# Patient Record
Sex: Male | Born: 1935
Health system: Southern US, Community
[De-identification: ages and names within clinical notes are randomized; demographics above are authoritative.]

## PROBLEM LIST (undated history)

## (undated) DIAGNOSIS — Z9889 Other specified postprocedural states: Secondary | ICD-10-CM

## (undated) DIAGNOSIS — R011 Cardiac murmur, unspecified: Secondary | ICD-10-CM

## (undated) DIAGNOSIS — G5600 Carpal tunnel syndrome, unspecified upper limb: Secondary | ICD-10-CM

## (undated) DIAGNOSIS — E1149 Type 2 diabetes mellitus with other diabetic neurological complication: Secondary | ICD-10-CM

## (undated) DIAGNOSIS — B009 Herpesviral infection, unspecified: Secondary | ICD-10-CM

## (undated) DIAGNOSIS — M48061 Spinal stenosis, lumbar region without neurogenic claudication: Secondary | ICD-10-CM

## (undated) DIAGNOSIS — J069 Acute upper respiratory infection, unspecified: Secondary | ICD-10-CM

## (undated) DIAGNOSIS — M549 Dorsalgia, unspecified: Secondary | ICD-10-CM

## (undated) DIAGNOSIS — R252 Cramp and spasm: Secondary | ICD-10-CM

## (undated) DIAGNOSIS — F329 Major depressive disorder, single episode, unspecified: Secondary | ICD-10-CM

## (undated) DIAGNOSIS — B353 Tinea pedis: Secondary | ICD-10-CM

## (undated) DIAGNOSIS — Z87898 Personal history of other specified conditions: Secondary | ICD-10-CM

## (undated) DIAGNOSIS — I2510 Atherosclerotic heart disease of native coronary artery without angina pectoris: Secondary | ICD-10-CM

## (undated) DIAGNOSIS — I200 Unstable angina: Secondary | ICD-10-CM

## (undated) DIAGNOSIS — E119 Type 2 diabetes mellitus without complications: Secondary | ICD-10-CM

## (undated) DIAGNOSIS — R197 Diarrhea, unspecified: Secondary | ICD-10-CM

## (undated) DIAGNOSIS — G4733 Obstructive sleep apnea (adult) (pediatric): Secondary | ICD-10-CM

## (undated) DIAGNOSIS — M25519 Pain in unspecified shoulder: Secondary | ICD-10-CM

## (undated) DIAGNOSIS — E785 Hyperlipidemia, unspecified: Secondary | ICD-10-CM

## (undated) DIAGNOSIS — M1990 Unspecified osteoarthritis, unspecified site: Secondary | ICD-10-CM

## (undated) DIAGNOSIS — M722 Plantar fascial fibromatosis: Secondary | ICD-10-CM

## (undated) DIAGNOSIS — I10 Essential (primary) hypertension: Secondary | ICD-10-CM

## (undated) DIAGNOSIS — G4762 Sleep related leg cramps: Secondary | ICD-10-CM

## (undated) DIAGNOSIS — Z9861 Coronary angioplasty status: Secondary | ICD-10-CM

## (undated) HISTORY — DX: Essential (primary) hypertension: I10

## (undated) HISTORY — DX: Carpal tunnel syndrome, unspecified upper limb: G56.00

## (undated) HISTORY — DX: Diarrhea, unspecified: R19.7

## (undated) HISTORY — DX: Major depressive disorder, single episode, unspecified: F32.9

## (undated) HISTORY — PX: NASAL SINUS SURGERY: SHX719

## (undated) HISTORY — DX: Spinal stenosis, lumbar region without neurogenic claudication: M48.061

## (undated) HISTORY — DX: Unspecified osteoarthritis, unspecified site: M19.90

## (undated) HISTORY — PX: CARDIAC CATHETERIZATION: SHX172

## (undated) HISTORY — DX: Coronary angioplasty status: Z98.61

## (undated) HISTORY — PX: CATARACT EXTRACTION: SUR2

## (undated) HISTORY — DX: Tinea pedis: B35.3

## (undated) HISTORY — DX: Obstructive sleep apnea (adult) (pediatric): G47.33

## (undated) HISTORY — DX: Other specified postprocedural states: Z98.89

## (undated) HISTORY — DX: Acute upper respiratory infection, unspecified: J06.9

## (undated) HISTORY — DX: Cramp and spasm: R25.2

## (undated) HISTORY — DX: Atherosclerotic heart disease of native coronary artery without angina pectoris: I25.10

## (undated) HISTORY — DX: Cardiac murmur, unspecified: R01.1

## (undated) HISTORY — DX: Unstable angina: I20.0

## (undated) HISTORY — DX: Type 2 diabetes mellitus with other diabetic neurological complication: E11.49

## (undated) HISTORY — DX: Personal history of other specified conditions: Z87.898

## (undated) HISTORY — DX: Herpesviral infection, unspecified: B00.9

## (undated) HISTORY — PX: COLONOSCOPY: SHX174

## (undated) HISTORY — PX: TONSILLECTOMY: SUR1361

## (undated) HISTORY — PX: OTHER SURGICAL HISTORY: SHX169

## (undated) HISTORY — PX: VASECTOMY: SHX75

## (undated) HISTORY — DX: Sleep related leg cramps: G47.62

## (undated) HISTORY — DX: Hyperlipidemia, unspecified: E78.5

## (undated) HISTORY — DX: Dorsalgia, unspecified: M54.9

## (undated) HISTORY — DX: Type 2 diabetes mellitus without complications: E11.9

## (undated) HISTORY — DX: Plantar fascial fibromatosis: M72.2

## (undated) HISTORY — DX: Pain in unspecified shoulder: M25.519

---

## 1998-06-02 ENCOUNTER — Ambulatory Visit: Admission: RE | Admit: 1998-06-02 | Discharge: 1998-06-02 | Payer: Self-pay | Admitting: Otolaryngology

## 1998-09-18 ENCOUNTER — Other Ambulatory Visit: Admission: RE | Admit: 1998-09-18 | Discharge: 1998-09-18 | Payer: Self-pay | Admitting: *Deleted

## 1999-01-08 ENCOUNTER — Ambulatory Visit (HOSPITAL_COMMUNITY): Admission: RE | Admit: 1999-01-08 | Discharge: 1999-01-08 | Payer: Self-pay | Admitting: Gastroenterology

## 1999-02-02 ENCOUNTER — Encounter: Payer: Self-pay | Admitting: Pulmonary Disease

## 1999-02-02 ENCOUNTER — Ambulatory Visit: Admission: RE | Admit: 1999-02-02 | Discharge: 1999-02-02 | Payer: Self-pay | Admitting: Pulmonary Disease

## 2002-04-13 HISTORY — PX: OTHER SURGICAL HISTORY: SHX169

## 2002-06-15 ENCOUNTER — Ambulatory Visit (HOSPITAL_BASED_OUTPATIENT_CLINIC_OR_DEPARTMENT_OTHER): Admission: RE | Admit: 2002-06-15 | Discharge: 2002-06-15 | Payer: Self-pay | Admitting: Orthopedic Surgery

## 2003-03-15 ENCOUNTER — Inpatient Hospital Stay (HOSPITAL_COMMUNITY): Admission: EM | Admit: 2003-03-15 | Discharge: 2003-03-17 | Payer: Self-pay | Admitting: Emergency Medicine

## 2004-08-01 ENCOUNTER — Ambulatory Visit: Payer: Self-pay | Admitting: Internal Medicine

## 2004-08-05 ENCOUNTER — Ambulatory Visit: Payer: Self-pay | Admitting: Internal Medicine

## 2004-11-13 ENCOUNTER — Ambulatory Visit: Payer: Self-pay | Admitting: Internal Medicine

## 2004-11-19 ENCOUNTER — Ambulatory Visit: Payer: Self-pay | Admitting: Internal Medicine

## 2004-11-19 ENCOUNTER — Encounter (INDEPENDENT_AMBULATORY_CARE_PROVIDER_SITE_OTHER): Payer: Self-pay | Admitting: Specialist

## 2005-03-03 ENCOUNTER — Ambulatory Visit: Payer: Self-pay | Admitting: Internal Medicine

## 2005-03-19 ENCOUNTER — Ambulatory Visit: Payer: Self-pay | Admitting: Internal Medicine

## 2005-03-26 ENCOUNTER — Ambulatory Visit: Payer: Self-pay | Admitting: Internal Medicine

## 2005-05-12 ENCOUNTER — Ambulatory Visit: Payer: Self-pay | Admitting: Licensed Clinical Social Worker

## 2005-05-25 ENCOUNTER — Ambulatory Visit: Payer: Self-pay | Admitting: Licensed Clinical Social Worker

## 2005-10-16 ENCOUNTER — Ambulatory Visit: Payer: Self-pay | Admitting: Internal Medicine

## 2005-10-26 ENCOUNTER — Ambulatory Visit: Payer: Self-pay | Admitting: Internal Medicine

## 2005-10-30 ENCOUNTER — Ambulatory Visit: Payer: Self-pay

## 2005-11-12 ENCOUNTER — Ambulatory Visit: Payer: Self-pay | Admitting: Internal Medicine

## 2005-12-15 ENCOUNTER — Ambulatory Visit: Payer: Self-pay | Admitting: Internal Medicine

## 2006-02-25 ENCOUNTER — Ambulatory Visit: Payer: Self-pay | Admitting: Internal Medicine

## 2006-03-18 ENCOUNTER — Ambulatory Visit: Payer: Self-pay | Admitting: Internal Medicine

## 2006-03-24 ENCOUNTER — Ambulatory Visit: Payer: Self-pay | Admitting: Internal Medicine

## 2006-03-24 LAB — CONVERTED CEMR LAB
ALT: 31 units/L (ref 0–40)
AST: 31 units/L (ref 0–37)
Chol/HDL Ratio, serum: 3.4
Cholesterol: 138 mg/dL (ref 0–200)
Creatinine, Ser: 0.8 mg/dL (ref 0.4–1.5)
HDL: 40.1 mg/dL (ref >39.0)
Hgb A1c MFr Bld: 6.2 % — ABNORMAL HIGH (ref 4.6–6.0)
LDL Cholesterol: 66 mg/dL (ref 0–99)
Potassium: 3.6 meq/L (ref 3.5–5.1)
Triglyceride fasting, serum: 162 mg/dL — ABNORMAL HIGH (ref 0–149)
VLDL: 32 mg/dL (ref 0–40)

## 2006-09-15 ENCOUNTER — Ambulatory Visit: Payer: Self-pay | Admitting: Internal Medicine

## 2006-09-15 LAB — CONVERTED CEMR LAB
ALT: 37 units/L (ref 0–40)
AST: 33 units/L (ref 0–37)
Albumin: 4.2 g/dL (ref 3.5–5.2)
Alkaline Phosphatase: 76 units/L (ref 39–117)
BUN: 13 mg/dL (ref 6–23)
Bilirubin, Direct: 0.2 mg/dL (ref 0.0–0.3)
CO2: 29 meq/L (ref 19–32)
Calcium: 9.3 mg/dL (ref 8.4–10.5)
Chloride: 107 meq/L (ref 96–112)
Cholesterol: 148 mg/dL (ref 0–200)
Creatinine, Ser: 0.8 mg/dL (ref 0.4–1.5)
Creatinine,U: 121.5 mg/dL
GFR calc Af Amer: 122 mL/min
GFR calc non Af Amer: 101 mL/min
Glucose, Bld: 153 mg/dL — ABNORMAL HIGH (ref 70–99)
HDL: 40.6 mg/dL (ref >39.0)
Hgb A1c MFr Bld: 6.4 % — ABNORMAL HIGH (ref 4.6–6.0)
LDL Cholesterol: 68 mg/dL (ref 0–99)
Microalb Creat Ratio: 44.4 mg/g — ABNORMAL HIGH (ref 0.0–30.0)
Microalb, Ur: 5.4 mg/dL — ABNORMAL HIGH (ref 0.0–1.9)
Potassium: 4.1 meq/L (ref 3.5–5.1)
Sodium: 143 meq/L (ref 135–145)
TSH: 1.94 microintl units/mL (ref 0.35–5.50)
Total Bilirubin: 1.2 mg/dL (ref 0.3–1.2)
Total CHOL/HDL Ratio: 3.6
Total Protein: 6.7 g/dL (ref 6.0–8.3)
Triglycerides: 199 mg/dL — ABNORMAL HIGH (ref 0–149)
VLDL: 40 mg/dL (ref 0–40)
Vit D, 1,25-Dihydroxy: 18 — ABNORMAL LOW (ref 20–57)

## 2006-10-13 ENCOUNTER — Ambulatory Visit: Payer: Self-pay | Admitting: Internal Medicine

## 2006-11-26 ENCOUNTER — Ambulatory Visit: Payer: Self-pay | Admitting: Internal Medicine

## 2007-01-19 ENCOUNTER — Encounter: Payer: Self-pay | Admitting: *Deleted

## 2007-01-19 ENCOUNTER — Ambulatory Visit: Payer: Self-pay | Admitting: Internal Medicine

## 2007-01-19 DIAGNOSIS — E119 Type 2 diabetes mellitus without complications: Secondary | ICD-10-CM

## 2007-01-19 DIAGNOSIS — G4733 Obstructive sleep apnea (adult) (pediatric): Secondary | ICD-10-CM

## 2007-01-19 DIAGNOSIS — I10 Essential (primary) hypertension: Secondary | ICD-10-CM

## 2007-01-19 DIAGNOSIS — R351 Nocturia: Secondary | ICD-10-CM

## 2007-01-19 DIAGNOSIS — N401 Enlarged prostate with lower urinary tract symptoms: Secondary | ICD-10-CM | POA: Insufficient documentation

## 2007-01-19 DIAGNOSIS — E1140 Type 2 diabetes mellitus with diabetic neuropathy, unspecified: Secondary | ICD-10-CM | POA: Insufficient documentation

## 2007-01-19 DIAGNOSIS — Z87898 Personal history of other specified conditions: Secondary | ICD-10-CM

## 2007-01-19 DIAGNOSIS — I119 Hypertensive heart disease without heart failure: Secondary | ICD-10-CM | POA: Insufficient documentation

## 2007-01-19 DIAGNOSIS — M722 Plantar fascial fibromatosis: Secondary | ICD-10-CM

## 2007-01-19 DIAGNOSIS — Z9861 Coronary angioplasty status: Secondary | ICD-10-CM | POA: Insufficient documentation

## 2007-01-19 DIAGNOSIS — I25110 Atherosclerotic heart disease of native coronary artery with unstable angina pectoris: Secondary | ICD-10-CM | POA: Insufficient documentation

## 2007-01-19 DIAGNOSIS — E782 Mixed hyperlipidemia: Secondary | ICD-10-CM | POA: Insufficient documentation

## 2007-01-19 DIAGNOSIS — E785 Hyperlipidemia, unspecified: Secondary | ICD-10-CM

## 2007-01-19 DIAGNOSIS — I25118 Atherosclerotic heart disease of native coronary artery with other forms of angina pectoris: Secondary | ICD-10-CM | POA: Insufficient documentation

## 2007-01-19 DIAGNOSIS — B009 Herpesviral infection, unspecified: Secondary | ICD-10-CM | POA: Insufficient documentation

## 2007-01-19 DIAGNOSIS — I2510 Atherosclerotic heart disease of native coronary artery without angina pectoris: Secondary | ICD-10-CM

## 2007-01-19 DIAGNOSIS — E7800 Pure hypercholesterolemia, unspecified: Secondary | ICD-10-CM

## 2007-01-19 DIAGNOSIS — Z9889 Other specified postprocedural states: Secondary | ICD-10-CM | POA: Insufficient documentation

## 2007-01-19 HISTORY — DX: Hyperlipidemia, unspecified: E78.5

## 2007-01-19 HISTORY — DX: Coronary angioplasty status: Z98.61

## 2007-01-19 HISTORY — DX: Plantar fascial fibromatosis: M72.2

## 2007-01-19 HISTORY — DX: Herpesviral infection, unspecified: B00.9

## 2007-01-19 HISTORY — DX: Personal history of other specified conditions: Z87.898

## 2007-01-19 HISTORY — DX: Type 2 diabetes mellitus without complications: E11.9

## 2007-01-19 HISTORY — DX: Other specified postprocedural states: Z98.89

## 2007-01-19 HISTORY — DX: Obstructive sleep apnea (adult) (pediatric): G47.33

## 2007-01-19 HISTORY — DX: Essential (primary) hypertension: I10

## 2007-01-19 HISTORY — DX: Atherosclerotic heart disease of native coronary artery without angina pectoris: I25.10

## 2007-01-19 LAB — CONVERTED CEMR LAB: Hgb A1c MFr Bld: 5.9 % (ref 4.6–6.0)

## 2007-02-23 ENCOUNTER — Ambulatory Visit: Payer: Self-pay | Admitting: Internal Medicine

## 2007-02-23 DIAGNOSIS — R197 Diarrhea, unspecified: Secondary | ICD-10-CM

## 2007-02-23 HISTORY — DX: Diarrhea, unspecified: R19.7

## 2007-02-24 ENCOUNTER — Encounter: Admission: RE | Admit: 2007-02-24 | Discharge: 2007-02-24 | Payer: Self-pay | Admitting: Internal Medicine

## 2007-02-24 LAB — CONVERTED CEMR LAB
ALT: 25 units/L (ref 0–53)
AST: 24 units/L (ref 0–37)
Albumin: 4.1 g/dL (ref 3.5–5.2)
Alkaline Phosphatase: 55 units/L (ref 39–117)
Amylase: 85 units/L (ref 27–131)
Bilirubin, Direct: 0.1 mg/dL (ref 0.0–0.3)
Lipase: 35 units/L (ref 11.0–59.0)
Total Bilirubin: 1 mg/dL (ref 0.3–1.2)
Total Protein: 7 g/dL (ref 6.0–8.3)

## 2007-03-24 ENCOUNTER — Ambulatory Visit: Payer: Self-pay | Admitting: Internal Medicine

## 2007-03-24 DIAGNOSIS — G5600 Carpal tunnel syndrome, unspecified upper limb: Secondary | ICD-10-CM | POA: Insufficient documentation

## 2007-03-24 DIAGNOSIS — M25519 Pain in unspecified shoulder: Secondary | ICD-10-CM | POA: Insufficient documentation

## 2007-03-24 HISTORY — DX: Carpal tunnel syndrome, unspecified upper limb: G56.00

## 2007-03-24 HISTORY — DX: Pain in unspecified shoulder: M25.519

## 2007-04-21 ENCOUNTER — Telehealth: Payer: Self-pay | Admitting: Internal Medicine

## 2007-06-24 ENCOUNTER — Ambulatory Visit: Payer: Self-pay | Admitting: Internal Medicine

## 2007-06-24 DIAGNOSIS — F331 Major depressive disorder, recurrent, moderate: Secondary | ICD-10-CM | POA: Insufficient documentation

## 2007-06-24 DIAGNOSIS — F3289 Other specified depressive episodes: Secondary | ICD-10-CM

## 2007-06-24 DIAGNOSIS — F329 Major depressive disorder, single episode, unspecified: Secondary | ICD-10-CM

## 2007-06-24 DIAGNOSIS — M159 Polyosteoarthritis, unspecified: Secondary | ICD-10-CM | POA: Insufficient documentation

## 2007-06-24 DIAGNOSIS — M1990 Unspecified osteoarthritis, unspecified site: Secondary | ICD-10-CM

## 2007-06-24 HISTORY — DX: Major depressive disorder, single episode, unspecified: F32.9

## 2007-06-24 HISTORY — DX: Unspecified osteoarthritis, unspecified site: M19.90

## 2007-06-24 HISTORY — DX: Other specified depressive episodes: F32.89

## 2007-06-24 LAB — CONVERTED CEMR LAB
ALT: 31 units/L (ref 0–53)
AST: 32 units/L (ref 0–37)
Albumin: 4.2 g/dL (ref 3.5–5.2)
Alkaline Phosphatase: 73 units/L (ref 39–117)
BUN: 19 mg/dL (ref 6–23)
Bilirubin, Direct: 0.2 mg/dL (ref 0.0–0.3)
CO2: 28 meq/L (ref 19–32)
Calcium: 9.6 mg/dL (ref 8.4–10.5)
Chloride: 110 meq/L (ref 96–112)
Cholesterol: 162 mg/dL (ref 0–200)
Creatinine, Ser: 0.9 mg/dL (ref 0.4–1.5)
GFR calc Af Amer: 106 mL/min
GFR calc non Af Amer: 88 mL/min
Glucose, Bld: 157 mg/dL — ABNORMAL HIGH (ref 70–99)
HDL: 47.3 mg/dL (ref >39.0)
Hgb A1c MFr Bld: 6.3 % — ABNORMAL HIGH (ref 4.6–6.0)
LDL Cholesterol: 80 mg/dL (ref 0–99)
Potassium: 4.1 meq/L (ref 3.5–5.1)
Sodium: 144 meq/L (ref 135–145)
Total Bilirubin: 0.8 mg/dL (ref 0.3–1.2)
Total CHOL/HDL Ratio: 3.4
Total Protein: 6.6 g/dL (ref 6.0–8.3)
Triglycerides: 172 mg/dL — ABNORMAL HIGH (ref 0–149)
VLDL: 34 mg/dL (ref 0–40)

## 2007-06-25 ENCOUNTER — Encounter: Payer: Self-pay | Admitting: Internal Medicine

## 2007-06-29 ENCOUNTER — Encounter: Payer: Self-pay | Admitting: Internal Medicine

## 2007-07-05 ENCOUNTER — Ambulatory Visit: Payer: Self-pay

## 2007-07-05 ENCOUNTER — Encounter: Payer: Self-pay | Admitting: Internal Medicine

## 2007-07-07 ENCOUNTER — Encounter: Payer: Self-pay | Admitting: Internal Medicine

## 2007-07-29 ENCOUNTER — Telehealth: Payer: Self-pay | Admitting: Internal Medicine

## 2007-07-29 ENCOUNTER — Ambulatory Visit: Payer: Self-pay | Admitting: Internal Medicine

## 2007-10-28 ENCOUNTER — Ambulatory Visit: Payer: Self-pay | Admitting: Internal Medicine

## 2007-10-28 DIAGNOSIS — R252 Cramp and spasm: Secondary | ICD-10-CM

## 2007-10-28 HISTORY — DX: Cramp and spasm: R25.2

## 2007-10-31 ENCOUNTER — Encounter (INDEPENDENT_AMBULATORY_CARE_PROVIDER_SITE_OTHER): Payer: Self-pay | Admitting: *Deleted

## 2007-11-17 ENCOUNTER — Ambulatory Visit: Payer: Self-pay | Admitting: Pulmonary Disease

## 2007-11-22 ENCOUNTER — Telehealth: Payer: Self-pay | Admitting: Internal Medicine

## 2007-12-20 ENCOUNTER — Telehealth: Payer: Self-pay | Admitting: Pulmonary Disease

## 2007-12-28 ENCOUNTER — Telehealth: Payer: Self-pay | Admitting: Internal Medicine

## 2008-01-02 ENCOUNTER — Ambulatory Visit: Payer: Self-pay | Admitting: Internal Medicine

## 2008-01-02 DIAGNOSIS — R011 Cardiac murmur, unspecified: Secondary | ICD-10-CM | POA: Insufficient documentation

## 2008-01-02 HISTORY — DX: Cardiac murmur, unspecified: R01.1

## 2008-01-05 ENCOUNTER — Telehealth (INDEPENDENT_AMBULATORY_CARE_PROVIDER_SITE_OTHER): Payer: Self-pay | Admitting: *Deleted

## 2008-01-11 ENCOUNTER — Encounter: Admission: RE | Admit: 2008-01-11 | Discharge: 2008-04-10 | Payer: Self-pay | Admitting: Internal Medicine

## 2008-01-11 ENCOUNTER — Ambulatory Visit: Payer: Self-pay

## 2008-01-16 ENCOUNTER — Telehealth: Payer: Self-pay | Admitting: Internal Medicine

## 2008-01-19 ENCOUNTER — Ambulatory Visit: Payer: Self-pay | Admitting: Pulmonary Disease

## 2008-01-30 ENCOUNTER — Ambulatory Visit: Payer: Self-pay | Admitting: Internal Medicine

## 2008-01-30 LAB — CONVERTED CEMR LAB
BUN: 16 mg/dL (ref 6–23)
CO2: 29 meq/L (ref 19–32)
Calcium: 9.1 mg/dL (ref 8.4–10.5)
Chloride: 104 meq/L (ref 96–112)
Cholesterol: 212 mg/dL (ref 0–200)
Creatinine, Ser: 0.8 mg/dL (ref 0.4–1.5)
Direct LDL: 117.1 mg/dL
GFR calc Af Amer: 122 mL/min
GFR calc non Af Amer: 101 mL/min
Glucose, Bld: 153 mg/dL — ABNORMAL HIGH (ref 70–99)
HDL: 39.3 mg/dL (ref >39.0)
Hgb A1c MFr Bld: 6.2 % — ABNORMAL HIGH (ref 4.6–6.0)
Potassium: 4.1 meq/L (ref 3.5–5.1)
Sodium: 141 meq/L (ref 135–145)
Total CHOL/HDL Ratio: 5.4
Triglycerides: 196 mg/dL — ABNORMAL HIGH (ref 0–149)
VLDL: 39 mg/dL (ref 0–40)

## 2008-01-31 ENCOUNTER — Telehealth: Payer: Self-pay | Admitting: Internal Medicine

## 2008-02-01 ENCOUNTER — Ambulatory Visit: Payer: Self-pay | Admitting: Internal Medicine

## 2008-02-07 ENCOUNTER — Encounter: Payer: Self-pay | Admitting: Internal Medicine

## 2008-02-07 ENCOUNTER — Encounter: Admission: RE | Admit: 2008-02-07 | Discharge: 2008-03-13 | Payer: Self-pay | Admitting: Internal Medicine

## 2008-02-08 ENCOUNTER — Telehealth: Payer: Self-pay | Admitting: Internal Medicine

## 2008-02-10 ENCOUNTER — Encounter: Payer: Self-pay | Admitting: Internal Medicine

## 2008-05-15 ENCOUNTER — Telehealth: Payer: Self-pay | Admitting: Internal Medicine

## 2008-07-17 ENCOUNTER — Telehealth: Payer: Self-pay | Admitting: Internal Medicine

## 2008-08-29 ENCOUNTER — Ambulatory Visit: Payer: Self-pay | Admitting: Internal Medicine

## 2008-08-29 ENCOUNTER — Telehealth: Payer: Self-pay | Admitting: Internal Medicine

## 2008-08-29 DIAGNOSIS — I200 Unstable angina: Secondary | ICD-10-CM | POA: Insufficient documentation

## 2008-08-29 HISTORY — DX: Unstable angina: I20.0

## 2008-08-30 ENCOUNTER — Telehealth: Payer: Self-pay | Admitting: Family Medicine

## 2008-08-30 ENCOUNTER — Telehealth: Payer: Self-pay | Admitting: Internal Medicine

## 2008-08-30 ENCOUNTER — Ambulatory Visit: Payer: Self-pay | Admitting: Cardiovascular Disease

## 2008-08-30 ENCOUNTER — Inpatient Hospital Stay (HOSPITAL_COMMUNITY): Admission: AD | Admit: 2008-08-30 | Discharge: 2008-09-01 | Payer: Self-pay | Admitting: Internal Medicine

## 2008-08-30 ENCOUNTER — Ambulatory Visit: Payer: Self-pay | Admitting: Internal Medicine

## 2008-08-31 ENCOUNTER — Encounter: Payer: Self-pay | Admitting: Cardiovascular Disease

## 2008-09-14 ENCOUNTER — Ambulatory Visit: Payer: Self-pay | Admitting: Internal Medicine

## 2008-09-17 ENCOUNTER — Encounter: Payer: Self-pay | Admitting: Cardiology

## 2008-09-17 ENCOUNTER — Ambulatory Visit: Payer: Self-pay | Admitting: Cardiovascular Disease

## 2008-09-20 ENCOUNTER — Encounter: Payer: Self-pay | Admitting: Internal Medicine

## 2008-10-02 ENCOUNTER — Telehealth: Payer: Self-pay | Admitting: Internal Medicine

## 2008-10-04 ENCOUNTER — Encounter: Payer: Self-pay | Admitting: Cardiovascular Disease

## 2008-10-04 ENCOUNTER — Telehealth: Payer: Self-pay | Admitting: Cardiovascular Disease

## 2008-10-08 ENCOUNTER — Encounter: Payer: Self-pay | Admitting: Cardiovascular Disease

## 2008-11-12 ENCOUNTER — Telehealth: Payer: Self-pay | Admitting: Cardiovascular Disease

## 2008-11-19 ENCOUNTER — Ambulatory Visit: Payer: Self-pay | Admitting: Cardiovascular Disease

## 2008-11-19 LAB — CONVERTED CEMR LAB
ALT: 23 units/L (ref 0–53)
AST: 25 units/L (ref 0–37)
Albumin: 4.1 g/dL (ref 3.5–5.2)
Alkaline Phosphatase: 59 units/L (ref 39–117)
Bilirubin, Direct: 0.1 mg/dL (ref 0.0–0.3)
Cholesterol: 131 mg/dL (ref 0–200)
HDL: 39.2 mg/dL (ref >39.00)
LDL Cholesterol: 52 mg/dL (ref 0–99)
Total Bilirubin: 1 mg/dL (ref 0.3–1.2)
Total CHOL/HDL Ratio: 3
Total CK: 58 units/L (ref 7–232)
Total Protein: 6.9 g/dL (ref 6.0–8.3)
Triglycerides: 198 mg/dL — ABNORMAL HIGH (ref 0.0–149.0)
VLDL: 39.6 mg/dL (ref 0.0–40.0)

## 2008-11-20 ENCOUNTER — Encounter: Payer: Self-pay | Admitting: Internal Medicine

## 2008-11-22 ENCOUNTER — Encounter: Payer: Self-pay | Admitting: Internal Medicine

## 2008-11-27 ENCOUNTER — Encounter: Payer: Self-pay | Admitting: Cardiovascular Disease

## 2008-11-29 ENCOUNTER — Ambulatory Visit (HOSPITAL_BASED_OUTPATIENT_CLINIC_OR_DEPARTMENT_OTHER): Admission: RE | Admit: 2008-11-29 | Discharge: 2008-11-29 | Payer: Self-pay | Admitting: Orthopedic Surgery

## 2008-11-29 ENCOUNTER — Encounter (INDEPENDENT_AMBULATORY_CARE_PROVIDER_SITE_OTHER): Payer: Self-pay | Admitting: Orthopedic Surgery

## 2008-12-12 ENCOUNTER — Encounter (INDEPENDENT_AMBULATORY_CARE_PROVIDER_SITE_OTHER): Payer: Self-pay | Admitting: Orthopedic Surgery

## 2008-12-12 ENCOUNTER — Ambulatory Visit: Admission: RE | Admit: 2008-12-12 | Discharge: 2008-12-12 | Payer: Self-pay | Admitting: Orthopedic Surgery

## 2008-12-12 ENCOUNTER — Ambulatory Visit: Payer: Self-pay | Admitting: Vascular Surgery

## 2008-12-18 ENCOUNTER — Ambulatory Visit (HOSPITAL_COMMUNITY): Admission: RE | Admit: 2008-12-18 | Discharge: 2008-12-18 | Payer: Self-pay | Admitting: Orthopedic Surgery

## 2008-12-19 ENCOUNTER — Encounter: Payer: Self-pay | Admitting: Internal Medicine

## 2008-12-22 ENCOUNTER — Telehealth: Payer: Self-pay | Admitting: Family Medicine

## 2008-12-23 ENCOUNTER — Encounter: Payer: Self-pay | Admitting: Internal Medicine

## 2008-12-31 ENCOUNTER — Ambulatory Visit: Payer: Self-pay | Admitting: Internal Medicine

## 2008-12-31 LAB — CONVERTED CEMR LAB
ALT: 30 units/L (ref 0–53)
AST: 30 units/L (ref 0–37)
Albumin: 4.1 g/dL (ref 3.5–5.2)
Alkaline Phosphatase: 72 units/L (ref 39–117)
BUN: 19 mg/dL (ref 6–23)
Basophils Absolute: 0.1 10*3/uL (ref 0.0–0.1)
Basophils Relative: 0.9 % (ref 0.0–3.0)
Bilirubin, Direct: 0 mg/dL (ref 0.0–0.3)
CO2: 30 meq/L (ref 19–32)
Calcium: 9.2 mg/dL (ref 8.4–10.5)
Chloride: 104 meq/L (ref 96–112)
Cholesterol: 203 mg/dL — ABNORMAL HIGH (ref 0–200)
Creatinine, Ser: 0.9 mg/dL (ref 0.4–1.5)
Direct LDL: 85.7 mg/dL
Eosinophils Absolute: 0.1 10*3/uL (ref 0.0–0.7)
Eosinophils Relative: 1.3 % (ref 0.0–5.0)
GFR calc non Af Amer: 87.47 mL/min (ref >60)
Glucose, Bld: 210 mg/dL — ABNORMAL HIGH (ref 70–99)
HCT: 45.5 % (ref 39.0–52.0)
HDL: 35.7 mg/dL — ABNORMAL LOW (ref >39.00)
Hemoglobin: 15.5 g/dL (ref 13.0–17.0)
Hgb A1c MFr Bld: 7.2 % — ABNORMAL HIGH (ref 4.6–6.5)
Lymphocytes Relative: 24.8 % (ref 12.0–46.0)
Lymphs Abs: 1.7 10*3/uL (ref 0.7–4.0)
MCHC: 34 g/dL (ref 30.0–36.0)
MCV: 90.8 fL (ref 78.0–100.0)
Magnesium: 2.5 mg/dL (ref 1.5–2.5)
Monocytes Absolute: 0.5 10*3/uL (ref 0.1–1.0)
Monocytes Relative: 7.8 % (ref 3.0–12.0)
Neutro Abs: 4.3 10*3/uL (ref 1.4–7.7)
Neutrophils Relative %: 65.2 % (ref 43.0–77.0)
Platelets: 177 10*3/uL (ref 150.0–400.0)
Potassium: 3.8 meq/L (ref 3.5–5.1)
RBC: 5.01 M/uL (ref 4.22–5.81)
RDW: 11.9 % (ref 11.5–14.6)
Sodium: 141 meq/L (ref 135–145)
TSH: 1.64 microintl units/mL (ref 0.35–5.50)
Total Bilirubin: 1 mg/dL (ref 0.3–1.2)
Total CHOL/HDL Ratio: 6
Total Protein: 6.8 g/dL (ref 6.0–8.3)
Triglycerides: 838 mg/dL — ABNORMAL HIGH (ref 0.0–149.0)
VLDL: 167.6 mg/dL — ABNORMAL HIGH (ref 0.0–40.0)
WBC: 6.7 10*3/uL (ref 4.5–10.5)

## 2009-01-11 ENCOUNTER — Ambulatory Visit: Payer: Self-pay | Admitting: Internal Medicine

## 2009-01-11 DIAGNOSIS — J069 Acute upper respiratory infection, unspecified: Secondary | ICD-10-CM | POA: Insufficient documentation

## 2009-01-11 HISTORY — DX: Acute upper respiratory infection, unspecified: J06.9

## 2009-01-22 ENCOUNTER — Encounter: Payer: Self-pay | Admitting: Internal Medicine

## 2009-01-25 ENCOUNTER — Telehealth: Payer: Self-pay | Admitting: Cardiovascular Disease

## 2009-01-29 ENCOUNTER — Telehealth (INDEPENDENT_AMBULATORY_CARE_PROVIDER_SITE_OTHER): Payer: Self-pay | Admitting: *Deleted

## 2009-02-23 ENCOUNTER — Encounter: Payer: Self-pay | Admitting: Internal Medicine

## 2009-03-12 ENCOUNTER — Telehealth: Payer: Self-pay | Admitting: Internal Medicine

## 2009-03-14 ENCOUNTER — Telehealth: Payer: Self-pay | Admitting: Internal Medicine

## 2009-03-14 ENCOUNTER — Encounter: Payer: Self-pay | Admitting: Internal Medicine

## 2009-03-25 ENCOUNTER — Encounter: Payer: Self-pay | Admitting: Internal Medicine

## 2009-04-04 ENCOUNTER — Ambulatory Visit: Payer: Self-pay | Admitting: Internal Medicine

## 2009-04-04 LAB — CONVERTED CEMR LAB: Blood Glucose, Fingerstick: 188

## 2009-04-16 ENCOUNTER — Telehealth: Payer: Self-pay | Admitting: Internal Medicine

## 2009-04-18 ENCOUNTER — Ambulatory Visit: Payer: Self-pay | Admitting: Internal Medicine

## 2009-04-18 DIAGNOSIS — M549 Dorsalgia, unspecified: Secondary | ICD-10-CM

## 2009-04-18 HISTORY — DX: Dorsalgia, unspecified: M54.9

## 2009-04-25 ENCOUNTER — Encounter: Payer: Self-pay | Admitting: Internal Medicine

## 2009-04-29 ENCOUNTER — Ambulatory Visit: Payer: Self-pay | Admitting: Internal Medicine

## 2009-04-30 ENCOUNTER — Ambulatory Visit: Payer: Self-pay | Admitting: Cardiovascular Disease

## 2009-05-08 ENCOUNTER — Telehealth: Payer: Self-pay | Admitting: Internal Medicine

## 2009-05-09 ENCOUNTER — Ambulatory Visit (HOSPITAL_COMMUNITY): Admission: RE | Admit: 2009-05-09 | Discharge: 2009-05-09 | Payer: Self-pay | Admitting: Internal Medicine

## 2009-05-13 ENCOUNTER — Telehealth (INDEPENDENT_AMBULATORY_CARE_PROVIDER_SITE_OTHER): Payer: Self-pay | Admitting: *Deleted

## 2009-05-13 ENCOUNTER — Encounter: Payer: Self-pay | Admitting: Internal Medicine

## 2009-05-25 ENCOUNTER — Encounter: Payer: Self-pay | Admitting: Internal Medicine

## 2009-05-28 ENCOUNTER — Encounter: Payer: Self-pay | Admitting: Internal Medicine

## 2009-06-03 ENCOUNTER — Ambulatory Visit: Payer: Self-pay | Admitting: Internal Medicine

## 2009-06-03 DIAGNOSIS — M48061 Spinal stenosis, lumbar region without neurogenic claudication: Secondary | ICD-10-CM

## 2009-06-03 HISTORY — DX: Spinal stenosis, lumbar region without neurogenic claudication: M48.061

## 2009-06-04 ENCOUNTER — Telehealth: Payer: Self-pay | Admitting: Internal Medicine

## 2009-06-26 ENCOUNTER — Encounter: Payer: Self-pay | Admitting: Internal Medicine

## 2009-07-22 ENCOUNTER — Telehealth: Payer: Self-pay | Admitting: Internal Medicine

## 2009-07-31 ENCOUNTER — Telehealth (INDEPENDENT_AMBULATORY_CARE_PROVIDER_SITE_OTHER): Payer: Self-pay | Admitting: *Deleted

## 2009-08-01 ENCOUNTER — Ambulatory Visit: Payer: Self-pay | Admitting: Internal Medicine

## 2009-08-13 ENCOUNTER — Ambulatory Visit: Payer: Self-pay | Admitting: Internal Medicine

## 2009-08-13 LAB — CONVERTED CEMR LAB
ALT: 24 units/L (ref 0–53)
AST: 26 units/L (ref 0–37)
Albumin: 4.1 g/dL (ref 3.5–5.2)
Alkaline Phosphatase: 65 units/L (ref 39–117)
BUN: 18 mg/dL (ref 6–23)
Basophils Absolute: 0 10*3/uL (ref 0.0–0.1)
Basophils Relative: 0.6 % (ref 0.0–3.0)
Bilirubin Urine: NEGATIVE
Bilirubin, Direct: 0.2 mg/dL (ref 0.0–0.3)
CO2: 29 meq/L (ref 19–32)
Calcium: 9.4 mg/dL (ref 8.4–10.5)
Chloride: 106 meq/L (ref 96–112)
Cholesterol: 240 mg/dL — ABNORMAL HIGH (ref 0–200)
Creatinine, Ser: 0.7 mg/dL (ref 0.4–1.5)
Direct LDL: 147 mg/dL
Eosinophils Absolute: 0.1 10*3/uL (ref 0.0–0.7)
Eosinophils Relative: 1.8 % (ref 0.0–5.0)
GFR calc non Af Amer: 116.7 mL/min (ref >60)
Glucose, Bld: 198 mg/dL — ABNORMAL HIGH (ref 70–99)
HCT: 44.7 % (ref 39.0–52.0)
HDL: 46.4 mg/dL (ref >39.00)
Hemoglobin, Urine: NEGATIVE
Hemoglobin: 15.4 g/dL (ref 13.0–17.0)
Hgb A1c MFr Bld: 8 % — ABNORMAL HIGH (ref 4.6–6.5)
Ketones, ur: NEGATIVE mg/dL
Lymphocytes Relative: 24.7 % (ref 12.0–46.0)
Lymphs Abs: 1.6 10*3/uL (ref 0.7–4.0)
MCHC: 34.4 g/dL (ref 30.0–36.0)
MCV: 91.7 fL (ref 78.0–100.0)
Monocytes Absolute: 0.6 10*3/uL (ref 0.1–1.0)
Monocytes Relative: 9.4 % (ref 3.0–12.0)
Neutro Abs: 4.2 10*3/uL (ref 1.4–7.7)
Neutrophils Relative %: 63.5 % (ref 43.0–77.0)
Nitrite: NEGATIVE
PSA: 0.31 ng/mL (ref 0.10–4.00)
Platelets: 177 10*3/uL (ref 150.0–400.0)
Potassium: 4.4 meq/L (ref 3.5–5.1)
RBC: 4.88 M/uL (ref 4.22–5.81)
RDW: 13.9 % (ref 11.5–14.6)
Sodium: 142 meq/L (ref 135–145)
Specific Gravity, Urine: >=1.03 (ref 1.000–1.030)
TSH: 1.84 microintl units/mL (ref 0.35–5.50)
Total Bilirubin: 0.9 mg/dL (ref 0.3–1.2)
Total CHOL/HDL Ratio: 5
Total Protein, Urine: NEGATIVE mg/dL
Total Protein: 6.2 g/dL (ref 6.0–8.3)
Triglycerides: 230 mg/dL — ABNORMAL HIGH (ref 0.0–149.0)
Urine Glucose: 100 mg/dL
Urobilinogen, UA: 0.2 (ref 0.0–1.0)
VLDL: 46 mg/dL — ABNORMAL HIGH (ref 0.0–40.0)
WBC: 6.6 10*3/uL (ref 4.5–10.5)
pH: 5 (ref 5.0–8.0)

## 2009-08-16 ENCOUNTER — Ambulatory Visit: Payer: Self-pay | Admitting: Internal Medicine

## 2009-08-26 ENCOUNTER — Encounter: Payer: Self-pay | Admitting: Internal Medicine

## 2009-08-27 ENCOUNTER — Encounter: Payer: Self-pay | Admitting: Internal Medicine

## 2009-08-30 ENCOUNTER — Ambulatory Visit: Payer: Self-pay | Admitting: Internal Medicine

## 2009-10-02 ENCOUNTER — Telehealth: Payer: Self-pay | Admitting: Internal Medicine

## 2009-10-16 ENCOUNTER — Ambulatory Visit: Payer: Self-pay | Admitting: Internal Medicine

## 2009-10-16 LAB — CONVERTED CEMR LAB
Cholesterol: 207 mg/dL — ABNORMAL HIGH (ref 0–200)
Direct LDL: 112.2 mg/dL
HDL: 53.4 mg/dL (ref >39.00)
Total CHOL/HDL Ratio: 4
Triglycerides: 219 mg/dL — ABNORMAL HIGH (ref 0.0–149.0)
VLDL: 43.8 mg/dL — ABNORMAL HIGH (ref 0.0–40.0)

## 2009-10-28 ENCOUNTER — Encounter: Payer: Self-pay | Admitting: Internal Medicine

## 2009-10-30 ENCOUNTER — Encounter (INDEPENDENT_AMBULATORY_CARE_PROVIDER_SITE_OTHER): Payer: Self-pay | Admitting: *Deleted

## 2009-11-07 ENCOUNTER — Telehealth: Payer: Self-pay | Admitting: Internal Medicine

## 2009-11-11 ENCOUNTER — Ambulatory Visit: Payer: Self-pay | Admitting: Internal Medicine

## 2009-11-11 LAB — CONVERTED CEMR LAB: Hgb A1c MFr Bld: 6.9 % — ABNORMAL HIGH (ref 4.6–6.5)

## 2009-11-14 ENCOUNTER — Encounter: Payer: Self-pay | Admitting: Internal Medicine

## 2009-11-15 ENCOUNTER — Ambulatory Visit: Payer: Self-pay | Admitting: Internal Medicine

## 2009-11-15 DIAGNOSIS — B353 Tinea pedis: Secondary | ICD-10-CM

## 2009-11-15 HISTORY — DX: Tinea pedis: B35.3

## 2009-11-18 ENCOUNTER — Telehealth: Payer: Self-pay | Admitting: Internal Medicine

## 2009-11-21 ENCOUNTER — Encounter: Payer: Self-pay | Admitting: Internal Medicine

## 2009-11-21 ENCOUNTER — Telehealth: Payer: Self-pay | Admitting: Internal Medicine

## 2009-11-28 ENCOUNTER — Ambulatory Visit: Payer: Self-pay | Admitting: Internal Medicine

## 2009-12-26 ENCOUNTER — Encounter (INDEPENDENT_AMBULATORY_CARE_PROVIDER_SITE_OTHER): Payer: Self-pay | Admitting: *Deleted

## 2009-12-30 ENCOUNTER — Ambulatory Visit: Payer: Self-pay | Admitting: Internal Medicine

## 2009-12-31 ENCOUNTER — Encounter: Payer: Self-pay | Admitting: Internal Medicine

## 2010-01-01 ENCOUNTER — Encounter: Payer: Self-pay | Admitting: Internal Medicine

## 2010-01-06 ENCOUNTER — Telehealth: Payer: Self-pay | Admitting: Internal Medicine

## 2010-01-07 ENCOUNTER — Ambulatory Visit (HOSPITAL_COMMUNITY): Admission: RE | Admit: 2010-01-07 | Discharge: 2010-01-07 | Payer: Self-pay | Admitting: Neurological Surgery

## 2010-01-07 ENCOUNTER — Encounter: Payer: Self-pay | Admitting: Internal Medicine

## 2010-01-10 ENCOUNTER — Encounter: Payer: Self-pay | Admitting: Internal Medicine

## 2010-01-13 ENCOUNTER — Ambulatory Visit: Payer: Self-pay | Admitting: Internal Medicine

## 2010-01-16 ENCOUNTER — Encounter: Payer: Self-pay | Admitting: Internal Medicine

## 2010-01-16 ENCOUNTER — Ambulatory Visit: Payer: Self-pay | Admitting: Internal Medicine

## 2010-01-22 ENCOUNTER — Telehealth (INDEPENDENT_AMBULATORY_CARE_PROVIDER_SITE_OTHER): Payer: Self-pay | Admitting: *Deleted

## 2010-02-17 ENCOUNTER — Encounter: Payer: Self-pay | Admitting: Internal Medicine

## 2010-02-21 ENCOUNTER — Ambulatory Visit: Payer: Self-pay | Admitting: Internal Medicine

## 2010-03-03 ENCOUNTER — Telehealth: Payer: Self-pay | Admitting: Internal Medicine

## 2010-04-16 ENCOUNTER — Telehealth: Payer: Self-pay | Admitting: Internal Medicine

## 2010-04-17 ENCOUNTER — Telehealth: Payer: Self-pay | Admitting: Internal Medicine

## 2010-04-21 ENCOUNTER — Telehealth: Payer: Self-pay | Admitting: Internal Medicine

## 2010-05-12 ENCOUNTER — Encounter: Payer: Self-pay | Admitting: Internal Medicine

## 2010-05-13 NOTE — Letter (Signed)
Summary: Colonoscopy Letter  [facility]  [address] [place], [place] [address]   Phone: [phone]  Fax: [phone]      October 30, 2009 MRN: [id number]   [facility] [address] [place], [place]  [address]   Dear Mr. Marling,   According to your medical record, it is time for you to schedule a Colonoscopy. The American Cancer Society recommends this procedure as a method to detect early colon cancer. Patients with a family history of colon cancer, or a personal history of colon polyps or inflammatory bowel disease are at increased risk.  This letter has beeen generated based on the recommendations made at the time of your procedure. If you feel that in your particular situation this may no longer apply, please contact our office.  Please call our office at [phone] to schedule this appointment or to update your records at your earliest convenience.  Thank you for cooperating with [place] to provide you with the very best care possible.   Sincerely,   Iva Boop, M.D.  [facility] [phone]
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
## 2010-05-13 NOTE — Progress Notes (Signed)
  Phone Note Refill Request Message from:  Fax from Pharmacy on July 22, 2009 10:57 AM  Refills Requested: Medication #1:  LUNESTA 3 MG TABS Take 1 tab by mouth at bedtime as needed   Last Refilled: 02/08/2008 recieved fax from [facility], please Advise refill  Initial call taken by: Ami Bullins CMA,  July 22, 2009 10:58 AM  Follow-up for Phone Call        ok to refill x 5 Follow-up by: Jacques Navy MD,  July 23, 2009 5:29 AM    Prescriptions: LUNESTA 3 MG TABS (ESZOPICLONE) Take 1 tab by mouth at bedtime as needed  #30 x 5   Entered by:   Ami Bullins CMA   Authorized by:   Jacques Navy MD   Signed by:   Bill Salinas CMA on 07/23/2009   Method used:   Telephoned to ...       [facility] [phone]* (retail)       [address] [place], [place]  [address]       Ph: [phone]       Fax: [phone]   RxID:   [id number]

## 2010-05-13 NOTE — Letter (Signed)
Summary: [facility] Instructions  [facility]  [address] [place], [place] [address]   Phone: [phone]  Fax: [phone]       KYRIAN STAGE    1932/01/02    MRN: [id number]        Procedure Day /Date:  Monday 01/13/2010     Arrival Time:  7:30 am     Procedure Time:  8:30 am     Location of Procedure:                    _x _  [facility] (4th Floor)                        PREPARATION FOR COLONOSCOPY WITH MOVIPREP   Starting 5 days prior to your procedure Wednesday 9/28 do not eat nuts, seeds, popcorn, corn, beans, peas,  salads, or any raw vegetables.  Do not take any fiber supplements (e.g. Metamucil, Citrucel, and Benefiber).  THE DAY BEFORE YOUR PROCEDURE         DATE: Sunday 10/2 1.  Drink clear liquids the entire day-NO SOLID FOOD  2.  Do not drink anything colored red or purple.  Avoid juices with pulp.  No orange juice.  3.  Drink at least 64 oz. (8 glasses) of fluid/clear liquids during the day to prevent dehydration and help the prep work efficiently.  CLEAR LIQUIDS INCLUDE: Water Jello Ice Popsicles Tea (sugar ok, no milk/cream) Powdered fruit flavored drinks Coffee (sugar ok, no milk/cream) Gatorade Juice: apple, white grape, white cranberry  Lemonade Clear bullion, consomm, broth Carbonated beverages (any kind) Strained chicken noodle soup Hard Candy                             4.  In the morning, mix first dose of MoviPrep solution:    Empty 1 Pouch A and 1 Pouch B into the disposable container    Add lukewarm drinking water to the top line of the container. Mix to dissolve    Refrigerate (mixed solution should be used within 24 hrs)  5.  Begin drinking the prep at 5:00 p.m. The MoviPrep container is divided by 4 marks.   Every 15 minutes drink the solution down to the next mark (approximately 8 oz) until the full liter is complete.   6.  Follow completed prep with 16 oz of clear liquid of your choice (Nothing red  or purple).  Continue to drink clear liquids until bedtime.  7.  Before going to bed, mix second dose of MoviPrep solution:    Empty 1 Pouch A and 1 Pouch B into the disposable container    Add lukewarm drinking water to the top line of the container. Mix to dissolve    Refrigerate  THE DAY OF YOUR PROCEDURE      DATE: Monday 10/3  Beginning at 3:30 a.m. (5 hours before procedure):         1. Every 15 minutes, drink the solution down to the next mark (approx 8 oz) until the full liter is complete.  2. Follow completed prep with 16 oz. of clear liquid of your choice.    3. You may drink clear liquids until 6:30 am(2 HOURS BEFORE PROCEDURE).   MEDICATION INSTRUCTIONS  Unless otherwise instructed, you should take regular prescription medications with a small sip of water   as early as possible the morning of your  procedure.  Diabetic patients - see separate instructions.    Additional medication instructions:  hold Furosemide morning of procedure only         OTHER INSTRUCTIONS  You will need a responsible adult at least 75 years of age to accompany you and drive you home.   This person must remain in the waiting room during your procedure.  Wear loose fitting clothing that is easily removed.  Leave jewelry and other valuables at home.  However, you may wish to bring a book to read or  an iPod/MP3 player to listen to music as you wait for your procedure to start.  Remove all body piercing jewelry and leave at home.  Total time from sign-in until discharge is approximately 2-3 hours.  You should go home directly after your procedure and rest.  You can resume normal activities the  day after your procedure.  The day of your procedure you should not:   Drive   Make legal decisions   Operate machinery   Drink alcohol   Return to work  You will receive specific instructions about eating, activities and medications before you leave.    The above  instructions have been reviewed and explained to me by   Sherren Kerns RN  December 30, 2009 11:21 AM   I fully understand and can verbalize these instructions _____________________________ Date _________

## 2010-05-13 NOTE — Assessment & Plan Note (Signed)
Summary: DEPRESSION  STC   Vital Signs:  Patient profile:   77 year old male Height:      65 inches Weight:      176 pounds BMI:     29.39 O2 Sat:      95 % on Room air Temp:     97.3 degrees F oral Pulse rate:   62 / minute BP sitting:   164 / 82  (left arm) Cuff size:   regular  Vitals Entered By: Bill Salinas CMA (February 21, 2010 2:25 PM)  O2 Flow:  Room air CC: ov to discuss depression/ ab Comments pt is no longer taking diltiazem/ ab   Primary Care Provider:  Marsia Cino  CC:  ov to discuss depression/ ab.  History of Present Illness: Patient with significant DDD. He has had a second opinion as to surgical intervention from Dr. Raynald Kemp at [facility]. At this time he prefers conservative therapy including PT since his discomfort is very tolerable.  He does report multiple vegative signs of depression and is inquiring as to best medical therapy. He, when asked to choose, described a low motivational state as opposed to worry and perseveration. He has no suicidal ideation.   Current Medications (verified): 1)  Toprol Xl 50 Mg  Tb24 (Metoprolol Succinate) .... Take One Tablet Once Daily 2)  Furosemide 40 Mg Tabs (Furosemide) .Marland Kitchen.. 1 By Mouth Once Daily 3)  Freestyle Lite Test   Strp (Glucose Blood) .... As Directed 4)  Lunesta 3 Mg Tabs (Eszopiclone) .... Take 1 Tab By Mouth At Bedtime As Needed 5)  Finasteride 5 Mg Tabs (Finasteride) .Marland Kitchen.. 1 Tab By Mouth Daily 6)  Eq Aspirin Low Dose 81 Mg Tabs (Aspirin) .Marland Kitchen.. 1 By Mouth Daily 7)  Acetaminophen 500 Mg Tabs (Acetaminophen) .... Take As Directed 8)  Metformin Hcl 500 Mg Tabs (Metformin Hcl) .Marland Kitchen.. 1 By Mouth Two Times A Day 9)  Losartan Potassium 50 Mg Tabs (Losartan Potassium) .Marland Kitchen.. 1 By Mouth Qd 10)  Diltiazem Hcl Coated Beads 120 Mg Xr24h-Cap (Diltiazem Hcl Coated Beads) .Marland Kitchen.. 1 By Mouth Q Pm For Control of Cramps 11)  Meloxicam 7.5 Mg Tabs (Meloxicam) 12)  Crestor 20 Mg Tabs (Rosuvastatin Calcium) .Marland Kitchen.. 1 Tab Once Daily 13)   Fluconazole 100 Mg Tabs (Fluconazole) .Marland Kitchen.. 1 By Mouth Qpm 14)  Gabapentin 300 Mg Caps (Gabapentin)  Allergies (verified): No Known Drug Allergies PMH-FH-SH reviewed-no changes except otherwise noted  Review of Systems  The patient denies anorexia, chest pain, headaches, abdominal pain, muscle weakness, difficulty walking, and enlarged lymph nodes.    Physical Exam  General:  Well-developed,well-nourished,in no acute distress; alert,appropriate and cooperative throughout examination Head:  normocephalic and atraumatic.   Lungs:  normal respiratory effort.   Heart:  normal rate and regular rhythm.   Neurologic:  alert & oriented X3, cranial nerves II-XII intact, and gait normal.   Skin:  turgor normal and color normal.   Psych:  Oriented X3, normally interactive, good eye contact, not anxious appearing, and dysphoric affect.     Impression & Recommendations:  Problem # 1:  SPINAL STENOSIS, LUMBAR (ICD-724.02) for conservative therapy at this time.  Plan - maay increase gabapentin up to 600mg  three times a day to assist with pain relief.   Problem # 2:  CORONARY ARTERY DISEASE (ICD-414.00) Stable with no chest pain or other cardiac symptoms  His updated medication list for this problem includes:    Toprol Xl 50 Mg Tb24 (Metoprolol succinate) .Marland Kitchen... Take one tablet  once daily    Furosemide 40 Mg Tabs (Furosemide) .Marland Kitchen... 1 by mouth once daily    Eq Aspirin Low Dose 81 Mg Tabs (Aspirin) .Marland Kitchen... 1 by mouth daily    Losartan Potassium 50 Mg Tabs (Losartan potassium) .Marland Kitchen... 1 by mouth qd    Diltiazem Hcl Coated Beads 120 Mg Xr24h-cap (Diltiazem hcl coated beads) .Marland Kitchen... 1 by mouth q pm for control of cramps  Problem # 3:  HYPERTENSION (ICD-401.9)  His updated medication list for this problem includes:    Toprol Xl 50 Mg Tb24 (Metoprolol succinate) .Marland Kitchen... Take one tablet once daily    Furosemide 40 Mg Tabs (Furosemide) .Marland Kitchen... 1 by mouth once daily    Losartan Potassium 50 Mg Tabs (Losartan  potassium) .Marland Kitchen... 1 by mouth qd    Diltiazem Hcl Coated Beads 120 Mg Xr24h-cap (Diltiazem hcl coated beads) .Marland Kitchen... 1 by mouth q pm for control of cramps  BP today: 164/82 Prior BP: 190/100 (01/16/2010)  Labs Reviewed: K+: 4.4 (08/13/2009)  Patient reports better control at home.   Plan - continued home monitoring with report back. He may come by for BP check and verification of accuracy of home monitor.  Problem # 4:  DEPRESSION, CHRONIC (ICD-311) On going problem. He is willing to try medication to help. He does describe a low motivation state.  Plan - wellbutrinXL 150mg  qAM           report back as to tolerabilty in a week.            ROV in 3-4 weeks.  His updated medication list for this problem includes:    Bupropion Hcl 150 Mg Xr24h-tab (Bupropion hcl) .Marland Kitchen... 1 by mouth qam for depression.  Complete Medication List: 1)  Toprol Xl 50 Mg Tb24 (Metoprolol succinate) .... Take one tablet once daily 2)  Furosemide 40 Mg Tabs (Furosemide) .Marland Kitchen.. 1 by mouth once daily 3)  Freestyle Lite Test Strp (Glucose blood) .... As directed 4)  Lunesta 3 Mg Tabs (Eszopiclone) .... Take 1 tab by mouth at bedtime as needed 5)  Finasteride 5 Mg Tabs (Finasteride) .Marland Kitchen.. 1 tab by mouth daily 6)  Eq Aspirin Low Dose 81 Mg Tabs (Aspirin) .Marland Kitchen.. 1 by mouth daily 7)  Acetaminophen 500 Mg Tabs (Acetaminophen) .... Take as directed 8)  Metformin Hcl 500 Mg Tabs (Metformin hcl) .Marland Kitchen.. 1 by mouth two times a day 9)  Losartan Potassium 50 Mg Tabs (Losartan potassium) .Marland Kitchen.. 1 by mouth qd 10)  Diltiazem Hcl Coated Beads 120 Mg Xr24h-cap (Diltiazem hcl coated beads) .Marland Kitchen.. 1 by mouth q pm for control of cramps 11)  Meloxicam 7.5 Mg Tabs (Meloxicam) 12)  Crestor 20 Mg Tabs (Rosuvastatin calcium) .Marland Kitchen.. 1 tab once daily 13)  Fluconazole 100 Mg Tabs (Fluconazole) .Marland Kitchen.. 1 by mouth qpm 14)  Gabapentin 300 Mg Caps (Gabapentin) 15)  Bupropion Hcl 150 Mg Xr24h-tab (Bupropion hcl) .Marland Kitchen.. 1 by mouth qam for  depression. Prescriptions: BUPROPION HCL 150 MG XR24H-TAB (BUPROPION HCL) 1 by mouth qAM for depression.  #30 x 12   Entered and Authorized by:   Jacques Navy MD   Signed by:   Jacques Navy MD on 02/21/2010   Method used:   Electronically to        [facility] [address] (retail)       [address] [place], [place]  [address]       Ph:  [phone]       Fax: [phone]   RxID:   [id number]    Orders Added: 1)  Est. Patient Level III [62952]

## 2010-05-13 NOTE — Procedures (Signed)
Summary: Colonoscopy  Patient: Siyon Linck Note: All result statuses are Final unless otherwise noted.  Tests: (1) Colonoscopy (COL)   COL Colonoscopy           [facility]ONE      [facility]     [address]     [place], [place]  [address]           COLONOSCOPY PROCEDURE REPORT           PATIENT:  Zadok, Holaway  MR#:  [id number]     BIRTHDATE:  1931-10-05, 77 yrs. old  GENDER:  male     ENDOSCOPIST:  Iva Boop, MD, [facility]     REF. BY:     PROCEDURE DATE:  01/13/2010     PROCEDURE:  Colonoscopy with snare polypectomy     ASA CLASS:  Class II     INDICATIONS:  FOBT positive stool found at [facility] clinic, also has a     second-degree relative and a third-degree relative with colon     cancer     MEDICATIONS:   Fentanyl 50 mcg IV, Versed 5 mg           DESCRIPTION OF PROCEDURE:   After the risks benefits and     alternatives of the procedure were thoroughly explained, informed     consent was obtained.  Digital rectal exam was performed and     revealed no rectal masses and an enlarged prostate.  No prostate     nodules and known moderate enlargement. The LB CF-[id number]     endoscope was introduced through the anus and advanced to the     cecum, which was identified by both the appendix and ileocecal     valve, without limitations.  The quality of the prep was good,     using MoviPrep.  The instrument was then slowly withdrawn as the     colon was fully examined. Insertion: 5:29 minutes, Withdrawal:     13:44 minutes     <<PROCEDUREIMAGES>>           FINDINGS:  Three polyps were found. They were diminutive. All < 5     mm, at splenic flexure and descending colon (2). Polyps were     snared without cautery. Retrieval was successful. snare polyp     Severe diverticulosis was found in the sigmoid colon.  This was     otherwise a normal examination of the colon.   Retroflexed views     in the rectum revealed no abnormalities.    The scope was then     withdrawn from the  patient and the procedure completed.           COMPLICATIONS:  None     ENDOSCOPIC IMPRESSION:     1) Three diminutive left colon polyps removed     2) Severe diverticulosis in the sigmoid colon     3) Otherwise normal examination, good prep           REPEAT EXAM:  Await pathology, but at 77 and with 3 diminutive     polyps only probably not planning on further routine colonoscopies     for screening. We discussed this prior to sedation.           Iva Boop, MD, [facility]           CC:  The Patient           n.     eSIGNED:  Iva Boop at 01/13/2010 09:19 AM           Conrad Wilmont, [id number]  Note: An exclamation mark (!) indicates a result that was not dispersed into the flowsheet. Document Creation Date: 01/13/2010 9:19 AM _______________________________________________________________________  (1) Order result status: Final Collection or observation date-time: 01/13/2010 09:03 Requested date-time:  Receipt date-time:  Reported date-time:  Referring Physician:   Ordering Physician: Stan Head [phone]) Specimen Source:  Source: Launa Grill Order Number: [id number] Lab site:

## 2010-05-13 NOTE — Consult Note (Signed)
Summary: Neurosurgery/[facility]  Neurosurgery/[facility]   Imported By: Sherian Rein 03/14/2010 10:40:31  _____________________________________________________________________  External Attachment:    Type:   Image     Comment:   External Document

## 2010-05-13 NOTE — Cardiovascular Report (Signed)
Summary: Heart Failure Program/[facility]  Heart Failure Program/[facility]   Imported By: Sherian Rein 04/17/2009 13:44:20  _____________________________________________________________________  External Attachment:    Type:   Image     Comment:   External Document

## 2010-05-13 NOTE — Progress Notes (Signed)
Summary: glucose monitor  Phone Note Call from Patient   Summary of Call: 1.Pt needs a new glucometer, his has stopped working. 2. Pt wants to know if he should fast for the upcomming A1c. Initial call taken by: Lamar Sprinkles, CMA,  November 07, 2009 11:55 AM  Follow-up for Phone Call        Called pt back uses freestyle monitor will leave up front for pick-up, also let him know he does'nt have to fast for test. Order is already in [facility] for Monday Follow-up by: Orlan Leavens RMA,  November 07, 2009 1:34 PM
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
## 2010-05-13 NOTE — Letter (Signed)
Summary: [facility]  [facility]   Imported By: Sherian Rein 01/13/2010 13:29:14  _____________________________________________________________________  External Attachment:    Type:   Image     Comment:   External Document
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
## 2010-05-13 NOTE — Progress Notes (Signed)
Summary: gabapentin refill  Phone Note Refill Request Message from:  Pharmacy on March 03, 2010 9:46 AM  Refills Requested: Medication #1:  GABAPENTIN 300 MG CAPS   Notes: 1-2 daily #60  Is this ok to fill for pt, I did not see where you have filled this before. Please advise Thanks  [facility]  Initial call taken by: Rock Nephew CMA,  March 03, 2010 9:47 AM  Follow-up for Phone Call        gabapentin 600mg  three times a day # 90 refill as needed  Add to med list. Takes for pain of spinal stenosis Follow-up by: Jacques Navy MD,  March 03, 2010 5:13 PM    New/Updated Medications: GABAPENTIN 600 MG TABS (GABAPENTIN) 1 three times a day Prescriptions: GABAPENTIN 600 MG TABS (GABAPENTIN) 1 three times a day  #90 x 3   Entered by:   Lamar Sprinkles, CMA   Authorized by:   Jacques Navy MD   Signed by:   Lamar Sprinkles, CMA on 03/03/2010   Method used:   Electronically to        [facility] (retail)       [address] [place], [place]  [address]       Ph: [phone]       Fax: [phone]   RxID:   [id number]

## 2010-05-13 NOTE — Letter (Signed)
Summary: Previsit letter  [facility]  [address] [place], [place] [address]   Phone: [phone]  Fax: [phone]       11/21/2009 MRN: [id number]  Polk Metsker [address] [place], [place]  [address]  Dear Mr. Wesch,  Welcome to the Gastroenterology Division at [facility].    You are scheduled to see a nurse for your pre-procedure visit on 12-30-09 at 11am on the 3rd floor at [facility], [address]  We ask that you try to arrive at our office 15 minutes prior to your appointment time to allow for check-in.  Your nurse visit will consist of discussing your medical and surgical history, your immediate family medical history, and your medications.    Please bring a complete list of all your medications or, if you prefer, bring the medication bottles and we will list them.  We will need to be aware of both prescribed and over the counter drugs.  We will need to know exact dosage information as well.  If you are on blood thinners (Coumadin, Plavix, Aggrenox, Ticlid, etc.) please call our office today/prior to your appointment, as we need to consult with your physician about holding your medication.   Please be prepared to read and sign documents such as consent forms, a financial agreement, and acknowledgement forms.  If necessary, and with your consent, a friend or relative is welcome to sit-in on the nurse visit with you.  Please bring your insurance card so that we may make a copy of it.  If your insurance requires a referral to see a specialist, please bring your referral form from your primary care physician.  No co-pay is required for this nurse visit.     If you cannot keep your appointment, please call [phone] to cancel or reschedule prior to your appointment date.  This allows [place] the opportunity to schedule an appointment for another patient in need of care.    Thank you for choosing [facility] for your medical needs.  We  appreciate the opportunity to care for you.  Please visit [place] at our website  to learn more about our practice.                     Sincerely.                                                                                                                   The Gastroenterology Division

## 2010-05-13 NOTE — Assessment & Plan Note (Signed)
Summary: COUGH---STC   Vital Signs:  Patient profile:   77 year old male Height:      65 inches Weight:      175 pounds BMI:     29.23 O2 Sat:      95 % on Room air Temp:     97.3 degrees F oral Pulse rate:   73 / minute BP sitting:   170 / 100  (left arm) Cuff size:   large  Vitals Entered By: Bill Salinas CMA (Aug 30, 2009 9:58 AM)  O2 Flow:  Room air CC: pt here with c/o head congestion and cough for a little over a week/ ab   Primary Care Provider:  Trayson Stitely  CC:  pt here with c/o head congestion and cough for a little over a week/ ab.  History of Present Illness: Had epidural injection Tuesday. Tolerated well but has not seen real improvement.   For a couple of weeks he has had cough, low grade fever, myaglias, scant sputum. Not taking anything for the cough. Mildly short of breath. No nighgt sweats.  His leg cramps are getting a little better.   Current Medications (verified): 1)  Toprol Xl 50 Mg  Tb24 (Metoprolol Succinate) .... Take One Tablet Once Daily 2)  Furosemide 40 Mg Tabs (Furosemide) .Marland Kitchen.. 1 By Mouth Once Daily 3)  Freestyle Lite Test   Strp (Glucose Blood) .... As Directed 4)  Lunesta 3 Mg Tabs (Eszopiclone) .... Take 1 Tab By Mouth At Bedtime As Needed 5)  Finasteride 5 Mg Tabs (Finasteride) .Marland Kitchen.. 1 Tab By Mouth Daily 6)  Eq Aspirin Low Dose 81 Mg Tabs (Aspirin) .Marland Kitchen.. 1 By Mouth Daily 7)  Acetaminophen 500 Mg Tabs (Acetaminophen) .... Take As Directed 8)  Metformin Hcl 500 Mg Tabs (Metformin Hcl) .Marland Kitchen.. 1 By Mouth Two Times A Day 9)  Losartan Potassium 50 Mg Tabs (Losartan Potassium) .Marland Kitchen.. 1 By Mouth Qd 10)  Aleve 220 Mg Tabs (Naproxen Sodium) .Marland Kitchen.. 1 Tab Two Times A Day 11)  Red Yeast Rice Extract 600 Mg Caps (Red Yeast Rice Extract) .... Take 1 By Mouth in Am 1 in Pm 12)  Diltiazem Hcl Coated Beads 120 Mg Xr24h-Cap (Diltiazem Hcl Coated Beads) .Marland Kitchen.. 1 By Mouth Q Pm For Control of Cramps 13)  Diphenhydramine Hcl 25 Mg Caps (Diphenhydramine Hcl) .Marland Kitchen.. 1 By Mouth At  Bedtime To Control Cramps  Allergies (verified): No Known Drug Allergies PMH-FH-SH reviewed-no changes except otherwise noted  Review of Systems  The patient denies anorexia, fever, weight loss, hoarseness, chest pain, dyspnea on exertion, peripheral edema, headaches, abdominal pain, hematochezia, incontinence, transient blindness, depression, enlarged lymph nodes, and angioedema.    Physical Exam  General:  alert, well-developed, well-nourished, and normal appearance.   Head:  normocephalic and atraumatic.   Eyes:  pupils equal, pupils round, and corneas and lenses clear.   Ears:  R ear normal and L ear normal.   Neck:  full ROM and no thyromegaly.   Lungs:  normal respiratory effort, normal breath sounds, no crackles, and no wheezes.   Heart:  normal rate, regular rhythm, no murmur, no gallop, and no JVD.   Msk:  no joint tenderness, no joint swelling, and no joint warmth.   Neurologic:  alert & oriented X3, cranial nerves II-XII intact, and strength normal in all extremities.     Impression & Recommendations:  Problem # 1:  URI (ICD-465.9) Resolving URI with no indication for antibiotics.Continue support care. Provided Rx for promethazine w/ codeine  for cough.   His updated medication list for this problem includes:    Eq Aspirin Low Dose 81 Mg Tabs (Aspirin) .Marland Kitchen... 1 by mouth daily    Acetaminophen 500 Mg Tabs (Acetaminophen) .Marland Kitchen... Take as directed    Aleve 220 Mg Tabs (Naproxen sodium) .Marland Kitchen... 1 tab two times a day    Diphenhydramine Hcl 25 Mg Caps (Diphenhydramine hcl) .Marland Kitchen... 1 by mouth at bedtime to control cramps  Complete Medication List: 1)  Toprol Xl 50 Mg Tb24 (Metoprolol succinate) .... Take one tablet once daily 2)  Furosemide 40 Mg Tabs (Furosemide) .Marland Kitchen.. 1 by mouth once daily 3)  Freestyle Lite Test Strp (Glucose blood) .... As directed 4)  Lunesta 3 Mg Tabs (Eszopiclone) .... Take 1 tab by mouth at bedtime as needed 5)  Finasteride 5 Mg Tabs (Finasteride) .Marland Kitchen.. 1 tab  by mouth daily 6)  Eq Aspirin Low Dose 81 Mg Tabs (Aspirin) .Marland Kitchen.. 1 by mouth daily 7)  Acetaminophen 500 Mg Tabs (Acetaminophen) .... Take as directed 8)  Metformin Hcl 500 Mg Tabs (Metformin hcl) .Marland Kitchen.. 1 by mouth two times a day 9)  Losartan Potassium 50 Mg Tabs (Losartan potassium) .Marland Kitchen.. 1 by mouth qd 10)  Aleve 220 Mg Tabs (Naproxen sodium) .Marland Kitchen.. 1 tab two times a day 11)  Red Yeast Rice Extract 600 Mg Caps (Red yeast rice extract) .... Take 1 by mouth in am 1 in pm 12)  Diltiazem Hcl Coated Beads 120 Mg Xr24h-cap (Diltiazem hcl coated beads) .Marland Kitchen.. 1 by mouth q pm for control of cramps 13)  Diphenhydramine Hcl 25 Mg Caps (Diphenhydramine hcl) .Marland Kitchen.. 1 by mouth at bedtime to control cramps

## 2010-05-13 NOTE — Letter (Signed)
   [facility] [address] [place], [place]  [address] Phone: [phone]      May 13, 2009   Finley Voigt [address] [place], [place] [address]  RE:  LAB RESULTS  Dear  Mr. Starks,  The following is an interpretation of your most recent lab tests.  Please take note of any instructions provided or changes to medications that have resulted from your lab work.     MRI does reveal moderate to severe spinal stenosis, the probable cause of your pain.   We will refer you to Dr. Danielle Dess for a neurosurgical consultation to discuss any potential treatment options. This does not mean any commitment to surgery, just an opportunity to know what is possible in the way of treatment.   Call or e-mail me if you have questions ([email or web address]).   Sincerely Yours,    Jacques Navy MD  R L SPINE W/O - [phone]   Clinical Data: Chronic low back pain.  Right hip and leg pain.   MRI LUMBAR SPINE WITHOUT CONTRAST   Technique:  Multiplanar and multiecho pulse sequences of the lumbar spine were obtained without intravenous contrast.   Comparison: Lumbar radiographs 04/18/2009.   Findings: Grade 1 anterior slip of L4-1 L5 measuring 3 mm.  The remainder of the alignment is normal.  Chronic compression fracture T12.  No acute fracture.  Small hemangioma in the T11 vertebral body.  Conus medullaris is normal and terminates at L1.   L1-2:  Mild disc degeneration   L2-3:  Negative   L3-4:  Mild disc and facet degeneration.  There is mild disc bulging and mild spinal stenosis.   L4-5:  Grade 1 anterior slip.  There is advanced facet degeneration with bony hypertrophy and ligamentum flavum hypertrophy.  There is bulging of the disc.  There is moderate to severe spinal stenosis. The left neural foramen is moderately narrowed.   L5-S1:  Moderate facet degeneration.  No disc protrusion or spinal stenosis.   IMPRESSION: Moderate to severe  spinal stenosis at L4-5.  There is grade 1 anterior slip of L4 on L5 with advanced facet degeneration.   Read By:  Camelia Phenes,  Judie Petit.D.

## 2010-05-13 NOTE — Letter (Signed)
Summary: [facility]  [facility]   Imported By: Sherian Rein 01/24/2010 13:10:02  _____________________________________________________________________  External Attachment:    Type:   Image     Comment:   External Document

## 2010-05-13 NOTE — Progress Notes (Signed)
  Phone Note Refill Request Message from:  Fax from Pharmacy on June 04, 2009 12:31 PM  Refills Requested: Medication #1:  FINASTERIDE 5 MG TABS 1 tab by mouth daily Initial call taken by: Ami Bullins CMA,  June 04, 2009 12:31 PM    Prescriptions: FINASTERIDE 5 MG TABS (FINASTERIDE) 1 tab by mouth daily  #90 x 3   Entered by:   Ami Bullins CMA   Authorized by:   Jacques Navy MD   Signed by:   Bill Salinas CMA on 06/04/2009   Method used:   Electronically to        [id number]* (retail)       [address] [place], [place]  [address]       Ph: [phone]       Fax: [phone]   RxID:   [id number]

## 2010-05-13 NOTE — Letter (Signed)
Summary: Diabetic Instructions  [facility]  [address] [place], [place] [address]   Phone: [phone]  Fax: [phone]    [name] May 06, 1931 MRN: [id number]   _xx _   ORAL DIABETIC MEDICATION INSTRUCTIONS  The day before your procedure:   Take your diabetic pill as you do normally  The day of your procedure:   Do not take your diabetic pill    We will check your blood sugar levels during the admission process and again in Recovery before discharging you home  ________________________________________________________________________

## 2010-05-13 NOTE — Letter (Signed)
Summary: Patient Notice- Polyp Results  [facility]  [address] [place], [place] [address]   Phone: [phone]  Fax: [phone]        January 16, 2010 MRN: [id number]    [facility] [address] [place], [place]  [address]    Dear [name],  I am pleased to inform you that the colon polyps removed during your recent colonoscopy were found to be benign (no cancer detected) upon pathologic examination.  Should you develop new or worsening symptoms of abdominal pain, bowel habit changes or bleeding from the rectum or bowels, please schedule an evaluation with either your primary care physician or with me.  No further action with gastroenterology is needed at this time. Please      follow-up with your primary care physician for your other healthcare      needs.  Please call us if you are having persistent problems or have questions about your condition that have not been fully answered at this time.   Sincerely,  Iva Boop MD, [facility]  This letter has been electronically signed by your physician.  Appended Document: Patient Notice- Polyp Results letter mailed

## 2010-05-13 NOTE — Letter (Signed)
[facility]   Imported By: Lester [place] 06/10/2009 07:24:53  _____________________________________________________________________  External Attachment:    Type:   Image     Comment:   External Document

## 2010-05-13 NOTE — Progress Notes (Signed)
Summary: Orthopedic referral  Phone Note From Other Clinic   Summary of Call: Dr Donette Larry [facility]) is no longer taking new pt, appt schedule with Dr Bud Face 02/17/10 @ 2pm. Initial call taken by: Dagoberto Reef,  January 22, 2010 11:48 AM  Follow-up for Phone Call        thank you Follow-up by: Jacques Navy MD,  January 22, 2010 2:22 PM

## 2010-05-13 NOTE — Progress Notes (Signed)
Summary: rx request  Phone Note Call from Patient Call back at Home Phone [phone]   Summary of Call: Patient left message on triage that he has an MRI tomorrow and needs a relaxant called into his pharmacy. Please advise. Initial call taken by: Lucious Groves,  May 08, 2009 1:10 PM  Follow-up for Phone Call        OK for diazepam 5mg   1 or 2 30 minutes before test. Will need a dreiver, especially if he takes 2.. #2 Follow-up by: Jacques Navy MD,  May 08, 2009 1:44 PM  Additional Follow-up for Phone Call Additional follow up Details #1::        Patient notified, and phoned prescription to pharmacy. Additional Follow-up by: Lucious Groves,  May 08, 2009 3:32 PM    New/Updated Medications: DIAZEPAM 5 MG TABS (DIAZEPAM) 1 or 2 by mouth 30 minutes before exam Prescriptions: DIAZEPAM 5 MG TABS (DIAZEPAM) 1 or 2 by mouth 30 minutes before exam  #2 x 0   Entered by:   Lucious Groves   Authorized by:   Jacques Navy MD   Signed by:   Lucious Groves on 05/08/2009   Method used:   Telephoned to ...       [facility] [phone]* (retail)       [address] [place], [place]  [address]       Ph: [phone]       Fax: [phone]   RxID:   [id number]

## 2010-05-13 NOTE — Assessment & Plan Note (Signed)
Summary: OV TO DISCUSS BACK SURGERY PER PT/#/CD   Vital Signs:  Patient profile:   77 year old male Height:      65 inches Weight:      177 pounds BMI:     29.56 O2 Sat:      93 % on Room air Temp:     96.7 degrees F oral Pulse rate:   60 / minute BP sitting:   134 / 90  (left arm)  Vitals Entered By: Lucious Groves (June 03, 2009 1:58 PM)  O2 Flow:  Room air CC: OV to discuss back surgery./kb Is Patient Diabetic? Yes Did you bring your meter with you today? No Pain Assessment Patient in pain? no        Primary Care Provider:  Norins  CC:  OV to discuss back surgery./kb.  History of Present Illness: patient presents for advice re: proposed ESI for spinal stenosis at L4-5 with neurogenic claudication and bilateral leg pain. He has seen Darl Pikes, Dr. Verlee Rossetti PA who has him scheduled at the surgical specialty center. I answered his questions. I advised he be sure that he knows who will be doing the procedure, and what his out of pocket liability will be for the surgical center, radiologist and operator. This is the appropriate step prior to any surgical consideration.   Current Medications (verified): 1)  Toprol Xl 50 Mg  Tb24 (Metoprolol Succinate) .... Take One Tablet Once Daily 2)  Furosemide 40 Mg Tabs (Furosemide) .Marland Kitchen.. 1 By Mouth Once Daily 3)  Freestyle Lite Test   Strp (Glucose Blood) .... As Directed 4)  Lunesta 3 Mg Tabs (Eszopiclone) .... Take 1 Tab By Mouth At Bedtime As Needed 5)  Finasteride 5 Mg Tabs (Finasteride) .Marland Kitchen.. 1 Tab By Mouth Daily 6)  Eq Aspirin Low Dose 81 Mg Tabs (Aspirin) .Marland Kitchen.. 1 By Mouth Daily 7)  Crestor 20 Mg Tabs (Rosuvastatin Calcium) .Marland Kitchen.. 1 By Mouth Once Daily 8)  Acetaminophen 500 Mg Tabs (Acetaminophen) .... Take As Directed 9)  Mobic 15 Mg Tabs (Meloxicam) .Marland Kitchen.. 1 Once Daily 10)  Metformin Hcl 500 Mg Tabs (Metformin Hcl) .Marland Kitchen.. 1 By Mouth Two Times A Day 11)  Losartan Potassium 50 Mg Tabs (Losartan Potassium) .Marland Kitchen.. 1 By Mouth Qd 12)  Gabapentin  600 Mg Tabs (Gabapentin) .... I Tab Three Times A Day  Allergies (verified): No Known Drug Allergies   Impression & Recommendations:  Problem # 1:  SPINAL STENOSIS, LUMBAR (ICD-724.02) Reviewed the patients MRI images with him and his wife as well as radiologist's report. Reviewed the plans for ESI. Offered education and counseling (100% of visit -15 min)  Complete Medication List: 1)  Toprol Xl 50 Mg Tb24 (Metoprolol succinate) .... Take one tablet once daily 2)  Furosemide 40 Mg Tabs (Furosemide) .Marland Kitchen.. 1 by mouth once daily 3)  Freestyle Lite Test Strp (Glucose blood) .... As directed 4)  Lunesta 3 Mg Tabs (Eszopiclone) .... Take 1 tab by mouth at bedtime as needed 5)  Finasteride 5 Mg Tabs (Finasteride) .Marland Kitchen.. 1 tab by mouth daily 6)  Eq Aspirin Low Dose 81 Mg Tabs (Aspirin) .Marland Kitchen.. 1 by mouth daily 7)  Crestor 20 Mg Tabs (Rosuvastatin calcium) .Marland Kitchen.. 1 by mouth once daily 8)  Acetaminophen 500 Mg Tabs (Acetaminophen) .... Take as directed 9)  Mobic 15 Mg Tabs (Meloxicam) .Marland Kitchen.. 1 once daily 10)  Metformin Hcl 500 Mg Tabs (Metformin hcl) .Marland Kitchen.. 1 by mouth two times a day 11)  Losartan Potassium 50 Mg Tabs (Losartan  potassium) .Marland Kitchen.. 1 by mouth qd 12)  Gabapentin 600 Mg Tabs (Gabapentin) .... I tab three times a day

## 2010-05-13 NOTE — Assessment & Plan Note (Signed)
Summary: [facility]E INFECTION/ [facility]NWS   Vital Signs:  Patient profile:   77 year old male Height:      65 inches Weight:      177 pounds BMI:     29.56 O2 Sat:      96 % on Room air Temp:     98.4 degrees F oral Pulse rate:   58 / minute BP sitting:   178 / 96  (left arm) Cuff size:   regular  Vitals Entered By: Bill Salinas CMA (November 28, 2009 4:52 PM)  O2 Flow:  Room air CC: pt here to discuss medications/ ab   Primary Care Provider:  Norins  CC:  pt here to discuss medications/ ab.  History of Present Illness: Patient presents for recheck of his left great toe where he has a mild paronychia. He is awaiting appointment with Podiatry. He reports the pain is decreased.   Current Medications (verified): 1)  Toprol Xl 50 Mg  Tb24 (Metoprolol Succinate) .... Take One Tablet Once Daily 2)  Furosemide 40 Mg Tabs (Furosemide) .Marland Kitchen.. 1 By Mouth Once Daily 3)  Freestyle Lite Test   Strp (Glucose Blood) .... As Directed 4)  Lunesta 3 Mg Tabs (Eszopiclone) .... Take 1 Tab By Mouth At Bedtime As Needed 5)  Finasteride 5 Mg Tabs (Finasteride) .Marland Kitchen.. 1 Tab By Mouth Daily 6)  Eq Aspirin Low Dose 81 Mg Tabs (Aspirin) .Marland Kitchen.. 1 By Mouth Daily 7)  Acetaminophen 500 Mg Tabs (Acetaminophen) .... Take As Directed 8)  Metformin Hcl 500 Mg Tabs (Metformin Hcl) .Marland Kitchen.. 1 By Mouth Two Times A Day 9)  Losartan Potassium 50 Mg Tabs (Losartan Potassium) .Marland Kitchen.. 1 By Mouth Qd 10)  Diltiazem Hcl Coated Beads 120 Mg Xr24h-Cap (Diltiazem Hcl Coated Beads) .Marland Kitchen.. 1 By Mouth Q Pm For Control of Cramps 11)  Meloxicam 7.5 Mg Tabs (Meloxicam) 12)  Crestor 20 Mg Tabs (Rosuvastatin Calcium) .Marland Kitchen.. 1 Tab Once Daily 13)  Fluconazole 100 Mg Tabs (Fluconazole) .Marland Kitchen.. 1 By Mouth Qpm  Allergies (verified): No Known Drug Allergies PMH-FH-SH reviewed-no changes except otherwise noted  Review of Systems  The patient denies anorexia, fever, peripheral edema, suspicious skin lesions, and difficulty walking.      Physical Exam  General:  Well-developed,well-nourished,in no acute distress; alert,appropriate and cooperative throughout examination Skin:  medial left great toe with a mild paronychia with minimal erythema, some tenderness. No red streaking.   Impression & Recommendations:  Problem # 1:  PARONYCHIA, LEFT GREAT TOE (ICD-681.11) Continue soaks and pledgett's. Keep appt with podiatry.  Complete Medication List: 1)  Toprol Xl 50 Mg Tb24 (Metoprolol succinate) .... Take one tablet once daily 2)  Furosemide 40 Mg Tabs (Furosemide) .Marland Kitchen.. 1 by mouth once daily 3)  Freestyle Lite Test Strp (Glucose blood) .... As directed 4)  Lunesta 3 Mg Tabs (Eszopiclone) .... Take 1 tab by mouth at bedtime as needed 5)  Finasteride 5 Mg Tabs (Finasteride) .Marland Kitchen.. 1 tab by mouth daily 6)  Eq Aspirin Low Dose 81 Mg Tabs (Aspirin) .Marland Kitchen.. 1 by mouth daily 7)  Acetaminophen 500 Mg Tabs (Acetaminophen) .... Take as directed 8)  Metformin Hcl 500 Mg Tabs (Metformin hcl) .Marland Kitchen.. 1 by mouth two times a day 9)  Losartan Potassium 50 Mg Tabs (Losartan potassium) .Marland Kitchen.. 1 by mouth qd 10)  Diltiazem Hcl Coated Beads 120 Mg Xr24h-cap (Diltiazem hcl coated beads) .Marland Kitchen.. 1 by mouth q pm for control of cramps 11)  Meloxicam 7.5 Mg  Tabs (Meloxicam) 12)  Crestor 20 Mg Tabs (Rosuvastatin calcium) .Marland Kitchen.. 1 tab once daily 13)  Fluconazole 100 Mg Tabs (Fluconazole) .Marland Kitchen.. 1 by mouth qpm

## 2010-05-13 NOTE — Letter (Signed)
[facility]   Imported By: Lester [place] 05/13/2009 09:46:23  _____________________________________________________________________  External Attachment:    Type:   Image     Comment:   External Document

## 2010-05-13 NOTE — Letter (Signed)
Summary: [facility]  [facility]   Imported By: Sherian Rein 07/08/2009 13:28:03  _____________________________________________________________________  External Attachment:    Type:   Image     Comment:   External Document

## 2010-05-13 NOTE — Consult Note (Signed)
Summary: [facility]   [facility]   Imported By: Sherian Rein 06/06/2009 10:41:25  _____________________________________________________________________  External Attachment:    Type:   Image     Comment:   External Document

## 2010-05-13 NOTE — Progress Notes (Signed)
  Phone Note Call from Patient   Summary of Call: Patient is requesting a call to know if he is on any "blood thinners".  Initial call taken by: Lamar Sprinkles, CMA,  November 21, 2009 10:47 AM  Follow-up for Phone Call        yes, low dose aspirin Follow-up by: Jacques Navy MD,  November 21, 2009 1:14 PM  Additional Follow-up for Phone Call Additional follow up Details #1::        Pt informed  Additional Follow-up by: Lamar Sprinkles, CMA,  November 21, 2009 2:23 PM

## 2010-05-13 NOTE — Progress Notes (Signed)
Summary: REFERRAL?  Phone Note Call from Patient Call back at Home Phone [phone]   Caller: Patient Summary of Call: Patient called [facility]  stating that he was seen by MD 11/15/09 and was told if toe did not get better then he will nedd to see "a toe MD". Pt is requesting to know name of MD Initial call taken by: Rock Nephew CMA,  November 18, 2009 4:35 PM  Follow-up for Phone Call        Pt c/o some increase in pain. He has been soaking twice daily and not been able to get cotton under nail as instructed.  Follow-up by: Lamar Sprinkles, CMA,  November 18, 2009 6:02 PM  Additional Follow-up for Phone Call Additional follow up Details #1::        podiatry referral: [facility]. [facility] notified.  Additional Follow-up by: Jacques Navy MD,  November 18, 2009 6:41 PM    Additional Follow-up for Phone Call Additional follow up Details #2::    [facility] to notify, closing phone note.Marland KitchenMarland KitchenAlvy Beal Archie CMA  November 19, 2009 9:21 AM
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
## 2010-05-13 NOTE — Letter (Signed)
Summary: Status Report / [facility]  Status Report / [facility]   Imported By: Lennie Odor 01/03/2010 11:50:43  _____________________________________________________________________  External Attachment:    Type:   Image     Comment:   External Document

## 2010-05-13 NOTE — Letter (Signed)
   [facility] [address] [place], [place]  [address] Phone: [phone]      November 14, 2009   [name] [address] [place], [place] [address]  RE:  LAB RESULTS  Dear  [name],  The following is an interpretation of your most recent lab tests.  Please take note of any instructions provided or changes to medications that have resulted from your lab work.    DIABETIC STUDIES:  Excellent - no changes needed Blood Glucose: 198   HgbA1C: 6.9   Microalbumin/Creatinine Ratio: 44.4     A1C below goal. GREAT JOB.  Keep up the good work.    Sincerely Yours,    Jacques Navy MD
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
## 2010-05-13 NOTE — Assessment & Plan Note (Signed)
Summary: BACK PROBLEM/[facility]   Vital Signs:  Patient profile:   77 year old male Height:      65 inches Weight:      176 pounds BMI:     29.39 O2 Sat:      95 % on Room air Temp:     96.6 degrees F oral Pulse rate:   72 / minute BP sitting:   152 / 90  (left arm) Cuff size:   large  Vitals Entered By: Bill Salinas CMA (April 29, 2009 9:21 AM)  O2 Flow:  Room air CC: pt c/o ongoing back pain with little relief taking gabapentin/ ab   Primary Care Provider:  [facility]  CC:  pt c/o ongoing back pain with little relief taking gabapentin/ ab.  History of Present Illness: Persistent back pain despite gabapentin 600mg  three times a day.  No change in symptoms. Re-reviewed L-S spine films with DJD and anterospondylosis. Did not need to repeat exam.   Current Medications (verified): 1)  Toprol Xl 50 Mg  Tb24 (Metoprolol Succinate) .... Take One Tablet Once Daily 2)  Furosemide 40 Mg Tabs (Furosemide) .Marland Kitchen.. 1 By Mouth Once Daily 3)  Freestyle Lite Test   Strp (Glucose Blood) .... As Directed 4)  Lunesta 3 Mg Tabs (Eszopiclone) .... Take 1 Tab By Mouth At Bedtime As Needed 5)  Finasteride 5 Mg Tabs (Finasteride) .Marland Kitchen.. 1 Tab By Mouth Daily 6)  Eq Aspirin Low Dose 81 Mg Tabs (Aspirin) .Marland Kitchen.. 1 By Mouth Daily 7)  Plavix 75 Mg Tabs (Clopidogrel Bisulfate) .Marland Kitchen.. 1 Once Daily 8)  Crestor 20 Mg Tabs (Rosuvastatin Calcium) .Marland Kitchen.. 1 By Mouth Once Daily 9)  Acetaminophen 500 Mg Tabs (Acetaminophen) .... Take As Directed 10)  Mobic 15 Mg Tabs (Meloxicam) .Marland Kitchen.. 1 Once Daily 11)  Metformin Hcl 500 Mg Tabs (Metformin Hcl) .Marland Kitchen.. 1 By Mouth Two Times A Day 12)  Losartan Potassium 50 Mg Tabs (Losartan Potassium) .Marland Kitchen.. 1 By Mouth Qd 13)  Gabapentin 600 Mg Tabs (Gabapentin) .... I Tab Three Times A Day  Allergies (verified): 1)  ! Metformin Hcl PMH-FH-SH reviewed-no changes except otherwise noted  Review of Systems MS:  Complains of low back pain, mid back pain, and stiffness; denies joint pain, joint redness,  joint swelling, and loss of strength.  Physical Exam  General:  Well-developed,well-nourished,in no acute distress; alert,appropriate and cooperative throughout examination Lungs:  normal respiratory effort and normal breath sounds.   Heart:  normal rate and regular rhythm.   Neurologic:  alert & oriented X3, cranial nerves II-XII intact, and gait normal.     Impression & Recommendations:  Problem # 1:  BACK PAIN, CHRONIC (ICD-724.5) Paqtient with persistent back pain unrelieved by gabapentin (see previous office note and phone notes). He does have significant arthritic changes on plain x-ray.  Plan - MRI L-S spine to evaluate for spinal stenosis or nerve root compression amenable to ESI or surgical referral.           continue mobic and gabapentin  His updated medication list for this problem includes:    Eq Aspirin Low Dose 81 Mg Tabs (Aspirin) .Marland Kitchen... 1 by mouth daily    Acetaminophen 500 Mg Tabs (Acetaminophen) .Marland Kitchen... Take as directed    Mobic 15 Mg Tabs (Meloxicam) .Marland Kitchen... 1 once daily  Orders: Radiology Referral (Radiology) Radiology Referral (Radiology)  Complete Medication List: 1)  Toprol Xl 50 Mg Tb24 (Metoprolol succinate) .... Take one tablet once daily 2)  Furosemide 40 Mg Tabs (Furosemide) .Marland KitchenMarland KitchenMarland Kitchen  1 by mouth once daily 3)  Freestyle Lite Test Strp (Glucose blood) .... As directed 4)  Lunesta 3 Mg Tabs (Eszopiclone) .... Take 1 tab by mouth at bedtime as needed 5)  Finasteride 5 Mg Tabs (Finasteride) .Marland Kitchen.. 1 tab by mouth daily 6)  Eq Aspirin Low Dose 81 Mg Tabs (Aspirin) .Marland Kitchen.. 1 by mouth daily 7)  Plavix 75 Mg Tabs (Clopidogrel bisulfate) .Marland Kitchen.. 1 once daily 8)  Crestor 20 Mg Tabs (Rosuvastatin calcium) .Marland Kitchen.. 1 by mouth once daily 9)  Acetaminophen 500 Mg Tabs (Acetaminophen) .... Take as directed 10)  Mobic 15 Mg Tabs (Meloxicam) .Marland Kitchen.. 1 once daily 11)  Metformin Hcl 500 Mg Tabs (Metformin hcl) .Marland Kitchen.. 1 by mouth two times a day 12)  Losartan Potassium 50 Mg Tabs (Losartan  potassium) .Marland Kitchen.. 1 by mouth qd 13)  Gabapentin 600 Mg Tabs (Gabapentin) .... I tab three times a day

## 2010-05-13 NOTE — Letter (Signed)
[facility]   Imported By: Lester [place] 09/12/2009 10:54:03  _____________________________________________________________________  External Attachment:    Type:   Image     Comment:   External Document

## 2010-05-13 NOTE — Progress Notes (Signed)
Summary: CALL?   Phone Note Call from Patient Call back at [facility] Phone [phone]   Summary of Call: Patient is requesting a call from MD.  Initial call taken by: Lamar Sprinkles, CMA,  January 06, 2010 10:07 AM  Follow-up for Phone Call        Patient called back staing that he no longer request call back fro [name].Marland KitchenMarland KitchenMarland KitchenAlvy Beal Archie CMA  January 06, 2010 11:31 AM

## 2010-05-13 NOTE — Progress Notes (Signed)
  Phone Note Refill Request Message from:  Fax from Pharmacy on April 16, 2009 8:16 AM  Refills Requested: Medication #1:  TOPROL XL 50 MG  TB24 Take one tablet once daily fax from [facility]  Initial call taken by: Ami Bullins CMA,  April 16, 2009 8:16 AM    Prescriptions: TOPROL XL 50 MG  TB24 (METOPROLOL SUCCINATE) Take one tablet once daily  #30 x 6   Entered by:   Ami Bullins CMA   Authorized by:   Jacques Navy MD   Signed by:   Bill Salinas CMA on 04/16/2009   Method used:   Electronically to        [id number] (retail)       [address] [place], [place]  [address]       Ph: [phone]       Fax: [phone]   RxID:   [id number]

## 2010-05-13 NOTE — Progress Notes (Signed)
Summary: call back  Phone Note Call from Patient Call back at [phone]   Caller: Patient Summary of Call: Patient lmovm requesting that MD give him a call ( do not indicate why).Marland KitchenMarland KitchenMarland KitchenAlvy Beal Archie CMA  October 02, 2009 10:25 AM   Follow-up for Phone Call        patient reports a recurrent outbreak of HSV on the penis. He has had this before and is sure of the diagnosis.  Plan - Valtrex 1000mg  once daily x 5 Follow-up by: Jacques Navy MD,  October 02, 2009 1:42 PM    New/Updated Medications: VALTREX 1 GM TABS (VALACYCLOVIR HCL) 1 by mouth once daily x 5 Prescriptions: VALTREX 1 GM TABS (VALACYCLOVIR HCL) 1 by mouth once daily x 5  #5 x 1   Entered and Authorized by:   Jacques Navy MD   Signed by:   Jacques Navy MD on 10/02/2009   Method used:   Electronically to        [facility] (retail)       [address] [place], [place]  [address]       Ph: [phone]       Fax: [phone]   RxID:   [id number]

## 2010-05-13 NOTE — Miscellaneous (Signed)
Summary: previsit prep/rm  Clinical Lists Changes  Medications: Added new medication of MOVIPREP 100 GM  SOLR (PEG-KCL-NACL-NASULF-NA ASC-C) As per prep instructions. - Signed Rx of MOVIPREP 100 GM  SOLR (PEG-KCL-NACL-NASULF-NA ASC-C) As per prep instructions.;  #1 x 0;  Signed;  Entered by: Sherren Kerns RN;  Authorized by: Iva Boop MD, [facility];  Method used: Electronically to [id number] [address] [place], [place], [place]  [address], Ph: [phone], Fax: [phone] Observations: Added new observation of ALLERGY REV: Done (12/30/2009 10:51)    Prescriptions: MOVIPREP 100 GM  SOLR (PEG-KCL-NACL-NASULF-NA ASC-C) As per prep instructions.  #1 x 0   Entered by:   Sherren Kerns RN   Authorized by:   Iva Boop MD, [facility]   Signed by:   Sherren Kerns RN on 12/30/2009   Method used:   Electronically to        [facility] [phone]* (retail)       [address] [place], [place]  [address]       Ph: [phone]       Fax: [phone]   RxID:   [id number]

## 2010-05-13 NOTE — Progress Notes (Signed)
Summary: Refill Request  Phone Note Refill Request Message from:  Pharmacy on [name] Fax # [phone]  Refills Requested: Medication #1:  CRESTOR 20 MG TABS 1 by mouth once daily   Dosage confirmed as above?Dosage Confirmed   Last Refilled: 04/15/2009 Initial call taken by: Harold Barban,  May 13, 2009 11:21 AM    Prescriptions: CRESTOR 20 MG TABS (ROSUVASTATIN CALCIUM) 1 by mouth once daily  #30 Tablet x 2   Entered by:   Ami Bullins CMA   Authorized by:   Jacques Navy MD   Signed by:   Bill Salinas CMA on 05/13/2009   Method used:   Electronically to        [id number] (retail)       [address] [place], [place]  [address]       Ph: [phone]       Fax: [phone]   RxID:   [id number]

## 2010-05-13 NOTE — Letter (Signed)
   [facility] [address] [place], [place]  [address] Phone: [phone]      Aug 26, 2009   [name] [address] [place], [place] [address]  RE:  LAB RESULTS  Dear  Mr. Costanza,  The following is an interpretation of your most recent lab tests.  Please take note of any instructions provided or changes to medications that have resulted from your lab work.    DIABETIC STUDIES:  Improved - continue management Blood Glucose: 198   HgbA1C: 8.0   Microalbumin/Creatinine Ratio: 44.4      A1C is considerably above goal of 7%. You need to continue metformin two times a day and really pay attention to your diet: no sugar, low carbs!  Repeat A1C in 3 months.   Sincerely Yours,    Jacques Navy MD

## 2010-05-13 NOTE — Progress Notes (Signed)
Summary: OV  Phone Note Call from Patient Call back at [facility] Phone [phone]   Summary of Call: Pt c/o frequent muscle cramps expecially at night. He is req apt w/[facility] asap. OK for work in? or schedule with someone else?  Initial call taken by: Lamar Sprinkles, CMA,  July 31, 2009 3:35 PM  Follow-up for Phone Call        OK to work in Follow-up by: Jacques Navy MD,  July 31, 2009 4:58 PM  Additional Follow-up for Phone Call Additional follow up Details #1::        Spoke w/pt, he will be here tomorrow at 4:30, please put on schedule THANKS Additional Follow-up by: Lamar Sprinkles, CMA,  July 31, 2009 5:13 PM    Additional Follow-up for Phone Call Additional follow up Details #2::    appt in IDX. Follow-up by: Verdell Face,  August 01, 2009 8:02 AM

## 2010-05-13 NOTE — Assessment & Plan Note (Signed)
Summary: sore big toe/cd   Vital Signs:  Patient profile:   77 year old male Height:      65 inches Weight:      175 pounds BMI:     29.23 O2 Sat:      96 % on Room air Temp:     97.9 degrees F oral Pulse rate:   65 / minute BP sitting:   160 / 92  (left arm) Cuff size:   large  Vitals Entered By: Ami Bullins CMA (November 15, 2009 1:54 PM)  O2 Flow:  Room air CC: Pt c/o pain, soreness in his left great toe/ ab Comments Pt is not taking Acetaminophen, aleve, Red Yeast Rice or Valtrex/ ab   Primary Care Provider:  Jolynda Townley  CC:  Pt c/o pain and soreness in his left great toe/ ab.  History of Present Illness: [name] is a 77 yo Caucasian M with a history of CAD, hyperlipidemia, and type II DM who presents with a 6 mo history of left big toe pain and tenderness. In the last 48 hrs the area medial to the left big toe nail bed has become increasingly inflamed, more red in appearance, and more tender to touch. The patient fears he may lose his foot to DM as his 2 aunts did. The patient recorded a Hgb A1C of 6.9% on 11/11/09.   Current Medications (verified): 1)  Toprol Xl 50 Mg  Tb24 (Metoprolol Succinate) .... Take One Tablet Once Daily 2)  Furosemide 40 Mg Tabs (Furosemide) .Marland Kitchen.. 1 By Mouth Once Daily 3)  Freestyle Lite Test   Strp (Glucose Blood) .... As Directed 4)  Lunesta 3 Mg Tabs (Eszopiclone) .... Take 1 Tab By Mouth At Bedtime As Needed 5)  Finasteride 5 Mg Tabs (Finasteride) .Marland Kitchen.. 1 Tab By Mouth Daily 6)  Eq Aspirin Low Dose 81 Mg Tabs (Aspirin) .Marland Kitchen.. 1 By Mouth Daily 7)  Acetaminophen 500 Mg Tabs (Acetaminophen) .... Take As Directed 8)  Metformin Hcl 500 Mg Tabs (Metformin Hcl) .Marland Kitchen.. 1 By Mouth Two Times A Day 9)  Losartan Potassium 50 Mg Tabs (Losartan Potassium) .Marland Kitchen.. 1 By Mouth Qd 10)  Aleve 220 Mg Tabs (Naproxen Sodium) .Marland Kitchen.. 1 Tab Two Times A Day 11)  Red Yeast Rice Extract 600 Mg Caps (Red Yeast Rice Extract) .... Take 1 By Mouth in Am 1 in Pm 12)  Diltiazem Hcl Coated  Beads 120 Mg Xr24h-Cap (Diltiazem Hcl Coated Beads) .Marland Kitchen.. 1 By Mouth Q Pm For Control of Cramps 13)  Diphenhydramine Hcl 25 Mg Caps (Diphenhydramine Hcl) .Marland Kitchen.. 1 By Mouth At Bedtime To Control Cramps 14)  Valtrex 1 Gm Tabs (Valacyclovir Hcl) .Marland Kitchen.. 1 By Mouth Once Daily X 5 15)  Meloxicam 7.5 Mg Tabs (Meloxicam) 16)  Crestor 20 Mg Tabs (Rosuvastatin Calcium) .Marland Kitchen.. 1 Tab Once Daily  Allergies (verified): No Known Drug Allergies PMH-FH-SH reviewed for relevance  Review of Systems       The patient complains of muscle weakness and depression.         positive - tinnitus, hearing loss, chest pain, purulent sputum, urgency, cramps, joint stiffness, swelling, pruritis, weakness, tremor, spasm, depressed.  negative - fever, chills, wt change, palpitations, syncope, cough, heartburn, nausea, vomiting, dysuria, frequency, hematuria, seizure, memory loss, anxious, insomnia, polyphagia, polyuria  Physical Exam  General:  alert, normal appearance, and overweight-appearing.   Head:  normocephalic and atraumatic.   Eyes:  pupils equal, pupils round, and pupils reactive to light.   Ears:  R ear normal, L ear normal, and no external deformities.   Nose:  no external deformity and no external erythema.   Mouth:  pharynx pink and moist, no erythema, and no exudates.   Neck:  supple, full ROM, and no masses.   Chest Wall:  no deformities, no tenderness, and no masses.   Lungs:  normal respiratory effort, no accessory muscle use, no crackles, and no wheezes.   Heart:  regular rhythm, no gallop, and no rub. systolic murmur. Abdomen:  RLQ tenderness and LUQ tenderness.   Msk:  no joint tenderness, no joint swelling, and no joint warmth.   Pulses:  R radial decreased and L radial decreased.   Extremities:  milld left pretibial edema and right pretibial edema.   Area medial to left big toe nail bed is red and tender to palpation. Neurologic:  alert & oriented X3, gait normal, RLE sensory loss, LLE sensory  loss, decreased sensation to PP, and decreased sensation to LT.   Skin:  turgor normal, color normal, and no rashes.  Patient has diffuse superficial scaling on the plantar aspect of both feet. He does have intertriginous irritaiton without fissuring between the toes, left foot great than right.  Cervical Nodes:  no anterior cervical adenopathy and no posterior cervical adenopathy.   Psych:  normally interactive, good eye contact, not anxious appearing, and not depressed appearing.     Impression & Recommendations:  Problem # 1:  PARONYCHIA, LEFT GREAT TOE (ICD-681.11) assessment - Patient's left big toe with very minimal tenderness and pain may be due to a slighly  ingrown toenail.  plan - Foot soaks in warm water with betadine for 5-8 minutes. Afterwards lift nail with cotton wedge to encourage growth outwards instead           of inwards. Allow nail to grow out.  Problem # 2:  HYPERLIPIDEMIA (ICD-272.4) assessment - Chronic history of hyperlipidemia refractory to red yeast rice alone. Patient has accepted medical advice to switch to Crestor.  plan - Crestor 20 Mg Tabs (Rosuvastatin calcium) .Marland Kitchen... 1 tab once daily  Problem # 3:  TINEA PEDIS (ICD-110.4)  assessment - Patient's feet are scaling and dry consistent with tinea pedis infection.  plan - Diflucan, 14 day treatment. May need to extend to 28 days if incomplete response           If no response will refer to dermatology   His updated medication list for this problem includes:    Fluconazole 100 Mg Tabs (Fluconazole) .Marland Kitchen... 1 by mouth qpm  Problem # 4:  DIABETES MELLITUS (ICD-250.00) Last AiC 6.%  - good control. Patient reenforced in his control efforts.   His updated medication list for this problem includes:    Eq Aspirin Low Dose 81 Mg Tabs (Aspirin) .Marland Kitchen... 1 by mouth daily    Metformin Hcl 500 Mg Tabs (Metformin hcl) .Marland Kitchen... 1 by mouth two times a day    Losartan Potassium 50 Mg Tabs (Losartan potassium) .Marland Kitchen... 1 by mouth  qd  Complete Medication List: 1)  Toprol Xl 50 Mg Tb24 (Metoprolol succinate) .... Take one tablet once daily 2)  Furosemide 40 Mg Tabs (Furosemide) .Marland Kitchen.. 1 by mouth once daily 3)  Freestyle Lite Test Strp (Glucose blood) .... As directed 4)  Lunesta 3 Mg Tabs (Eszopiclone) .... Take 1 tab by mouth at bedtime as needed 5)  Finasteride 5 Mg Tabs (Finasteride) .Marland Kitchen.. 1 tab by mouth daily 6)  Eq Aspirin Low Dose 81 Mg Tabs (  Aspirin) .Marland Kitchen.. 1 by mouth daily 7)  Acetaminophen 500 Mg Tabs (Acetaminophen) .... Take as directed 8)  Metformin Hcl 500 Mg Tabs (Metformin hcl) .Marland Kitchen.. 1 by mouth two times a day 9)  Losartan Potassium 50 Mg Tabs (Losartan potassium) .Marland Kitchen.. 1 by mouth qd 10)  Diltiazem Hcl Coated Beads 120 Mg Xr24h-cap (Diltiazem hcl coated beads) .Marland Kitchen.. 1 by mouth q pm for control of cramps 11)  Meloxicam 7.5 Mg Tabs (Meloxicam) 12)  Crestor 20 Mg Tabs (Rosuvastatin calcium) .Marland Kitchen.. 1 tab once daily 13)  Fluconazole 100 Mg Tabs (Fluconazole) .Marland Kitchen.. 1 by mouth qpm  Patient Instructions: 1)  sore toe - a mild paronychia, nail infection, that does not require oral antibiotics. Plan - soak twice a day in warm water with a touch of povidone-iodine. When the nail is soft work in a Regulatory affairs officer as a plegget to elevate the nail. Call if it gets more painful or inflammed. 2)  Tinea Pedis - looks like you have athelete's foot that is between the toes and involving the foot. Plan - fluconazole 100mg  once daily x 14 days. If no response - derm referral. If partial response will extend treatment an additional 14 days. 3)  Diabetes- GREAT CONTROL 4)  Cholesterol - continue crestor.  Prescriptions: FLUCONAZOLE 100 MG TABS (FLUCONAZOLE) 1 by mouth qPM  #14 x 1   Entered and Authorized by:   Jacques Navy MD   Signed by:   Jacques Navy MD on 11/15/2009   Method used:   Electronically to        [facility] (retail)       [address] [place], [place]   [address]       Ph: [phone]       Fax: [phone]   RxID:   [id number] CRESTOR 20 MG TABS (ROSUVASTATIN CALCIUM) 1 tab once daily  #30 x 12   Entered and Authorized by:   Jacques Navy MD   Signed by:   Jacques Navy MD on 11/15/2009   Method used:   Electronically to        [facility] (retail)       [address] [place], [place]  [address]       Ph: [phone]       Fax: [phone]   RxID:   [id number]

## 2010-05-13 NOTE — Letter (Signed)
   [facility] [address] [place], [place]  [address] Phone: [phone]      October 28, 2009   Cosby Pretlow [address] [place], [place] [address]  RE:  LAB RESULTS  Dear  Mr. Pontillo,  The following is an interpretation of your most recent lab tests.  Please take note of any instructions provided or changes to medications that have resulted from your lab work.  LIPID PANEL:  Fair - review at your next visit Triglyceride: 219.0   Cholesterol: 207   LDL: 52   HDL: 53.40   Chol/HDL%:  4    Not at goal of LDL less than 80 for patient with a prior cath with known coronary disease. The red yeast rice is not doing a good enough job and I would recommend resuming a statin drug, e.g. crestor. I can send a Rx to your pharmacy if you are willing to switch from red yeast rice to crestor. Let me know.   Sincerely Yours,    Jacques Navy MD  Eswin Ramsaran Note: All result statuses are Final unless otherwise noted.  Tests: (1) Lipid Panel (LIPID)   Cholesterol          [H]  207 mg/dL                   2-130     ATP III Classification            Desirable:  < 200 mg/dL                    Borderline High:  200 - 239 mg/dL               High:  > = 240 mg/dL   Triglycerides        [H]  219.0 mg/dL                 8.6-578.4     Normal:  <150 mg/dL     Borderline High:  696 - 199 mg/dL   HDL                       29.52 mg/dL                 >84.13   VLDL Cholesterol     [H]  43.8 mg/dL                  2.4-40.1  CHO/HDL Ratio:  CHD Risk                             4                    Men          Women     1/2 Average Risk     3.4          3.3     Average Risk          5.0          4.4     2X Average

## 2010-05-15 NOTE — Progress Notes (Signed)
  Phone Note Call from Patient Call back at Home Phone [phone]   Caller: [phone] Call For: Jacques Navy MD Summary of Call: Pt requests generic for Lipitor, he would like to switch from Crestor to Lipitor generic. Pt states it is MUCH cheaper. [facility] [address] is pharmacy. Initial call taken by: Verdell Face,  April 21, 2010 10:39 AM  Follow-up for Phone Call        ok to change to lipitor 40mg  once daily. please up-date med list Follow-up by: Jacques Navy MD,  April 21, 2010 1:06 PM    New/Updated Medications: LIPITOR 40 MG TABS (ATORVASTATIN CALCIUM) 1 tablet once a day Prescriptions: LIPITOR 40 MG TABS (ATORVASTATIN CALCIUM) 1 tablet once a day  #90 x 1   Entered by:   Ami Bullins CMA   Authorized by:   Jacques Navy MD   Signed by:   Bill Salinas CMA on 04/21/2010   Method used:   Electronically to        [facility] (retail)       [address] [place], [place]  [address]       Ph: [phone]       Fax: [phone]   RxID:   [id number]

## 2010-05-15 NOTE — Progress Notes (Signed)
  Prescriptions: MELOXICAM 7.5 MG TABS (MELOXICAM)   #30 x 6   Entered by:   Ami Bullins CMA   Authorized by:   Jacques Navy MD   Signed by:   Bill Salinas CMA on 04/16/2010   Method used:   Faxed to ...       [facility] [phone]* (retail)       [address] [place], [place]  [address]       Ph: [phone]       Fax: [phone]   RxID:   [id number]

## 2010-05-15 NOTE — Progress Notes (Signed)
  Phone Note Other Incoming      New/Updated Medications: MELOXICAM 15 MG TABS (MELOXICAM) 1 tablet once a day Prescriptions: MELOXICAM 15 MG TABS (MELOXICAM) 1 tablet once a day  #90 x 2   Entered by:   Ami Bullins CMA   Authorized by:   Jacques Navy MD   Signed by:   Bill Salinas CMA on 04/17/2010   Method used:   Telephoned to ...       [facility] [phone]* (retail)       [address] [place], [place]  [address]       Ph: [phone]       Fax: [phone]   RxID:   [id number]

## 2010-05-29 NOTE — Letter (Signed)
Summary: [facility]   Imported By: Lester [place] 05/22/2010 09:11:32  _____________________________________________________________________  External Attachment:    Type:   Image     Comment:   External Document

## 2010-05-30 ENCOUNTER — Telehealth: Payer: Self-pay | Admitting: Internal Medicine

## 2010-06-04 NOTE — Progress Notes (Signed)
       New/Updated Medications: FREESTYLE LITE TEST   STRP (GLUCOSE BLOOD) test up to two times a day as directed dx: 250.00 Prescriptions: FREESTYLE LITE TEST   STRP (GLUCOSE BLOOD) test up to two times a day as directed dx: 250.00  #78mth x 3   Entered by:   Lamar Sprinkles, CMA   Authorized by:   Jacques Navy MD   Signed by:   Lamar Sprinkles, CMA on 05/30/2010   Method used:   Electronically to        [facility] (retail)       [address] [place], [place]  [address]       Ph: [phone]       Fax: [phone]   RxID:   [id number]

## 2010-06-17 ENCOUNTER — Ambulatory Visit (INDEPENDENT_AMBULATORY_CARE_PROVIDER_SITE_OTHER): Payer: Medicare Other | Admitting: Internal Medicine

## 2010-06-17 ENCOUNTER — Other Ambulatory Visit: Payer: Self-pay | Admitting: Internal Medicine

## 2010-06-17 ENCOUNTER — Other Ambulatory Visit: Payer: Medicare Other

## 2010-06-17 ENCOUNTER — Encounter: Payer: Self-pay | Admitting: Internal Medicine

## 2010-06-17 DIAGNOSIS — E1149 Type 2 diabetes mellitus with other diabetic neurological complication: Secondary | ICD-10-CM

## 2010-06-17 DIAGNOSIS — I10 Essential (primary) hypertension: Secondary | ICD-10-CM

## 2010-06-17 DIAGNOSIS — Z79899 Other long term (current) drug therapy: Secondary | ICD-10-CM

## 2010-06-17 DIAGNOSIS — E785 Hyperlipidemia, unspecified: Secondary | ICD-10-CM

## 2010-06-17 DIAGNOSIS — E119 Type 2 diabetes mellitus without complications: Secondary | ICD-10-CM

## 2010-06-17 DIAGNOSIS — M48061 Spinal stenosis, lumbar region without neurogenic claudication: Secondary | ICD-10-CM

## 2010-06-17 HISTORY — DX: Type 2 diabetes mellitus with other diabetic neurological complication: E11.49

## 2010-06-17 LAB — LIPID PANEL
Cholesterol: 119 mg/dL (ref 0–200)
HDL: 41.2 mg/dL (ref >39.00)
LDL Cholesterol: 53 mg/dL (ref 0–99)
Total CHOL/HDL Ratio: 3
Triglycerides: 126 mg/dL (ref 0.0–149.0)
VLDL: 25.2 mg/dL (ref 0.0–40.0)

## 2010-06-17 LAB — BASIC METABOLIC PANEL
BUN: 14 mg/dL (ref 6–23)
CO2: 27 mEq/L (ref 19–32)
Calcium: 9.1 mg/dL (ref 8.4–10.5)
Chloride: 104 mEq/L (ref 96–112)
Creatinine, Ser: 0.7 mg/dL (ref 0.4–1.5)
GFR: 124.62 mL/min (ref >60.00)
Glucose, Bld: 172 mg/dL — ABNORMAL HIGH (ref 70–99)
Potassium: 4.2 mEq/L (ref 3.5–5.1)
Sodium: 139 mEq/L (ref 135–145)

## 2010-06-17 LAB — B12 AND FOLATE PANEL
Folate: 11.7 ng/mL (ref >5.9)
Vitamin B-12: 336 pg/mL (ref 211–911)

## 2010-06-17 LAB — HEPATIC FUNCTION PANEL
ALT: 26 U/L (ref 0–53)
AST: 28 U/L (ref 0–37)
Albumin: 4.4 g/dL (ref 3.5–5.2)
Alkaline Phosphatase: 64 U/L (ref 39–117)
Bilirubin, Direct: 0.2 mg/dL (ref 0.0–0.3)
Total Bilirubin: 0.8 mg/dL (ref 0.3–1.2)
Total Protein: 6.4 g/dL (ref 6.0–8.3)

## 2010-06-17 LAB — HEMOGLOBIN A1C: Hgb A1c MFr Bld: 7.4 % — ABNORMAL HIGH (ref 4.6–6.5)
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
## 2010-06-24 NOTE — Letter (Signed)
[facility] [address] [place], [place]  [address] Phone: [phone]      June 17, 2010   [place] [address] [place], [place] [address]  RE:  LAB RESULTS  Dear  Mr. Nicolson,  The following is an interpretation of your most recent lab tests.  Please take note of any instructions provided or changes to medications that have resulted from your lab work.  ELECTROLYTES:  Good - no changes needed  KIDNEY FUNCTION TESTS:  Good - no changes needed  LIVER FUNCTION TESTS:  Good - no changes needed  LIPID PANEL:  Good - no changes needed Triglyceride: 126.0   Cholesterol: 119   LDL: 53   HDL: 41.20   Chol/HDL%:  3   DIABETIC STUDIES:  Good - no changes needed Blood Glucose: 172   HgbA1C: 7.4   Microalbumin/Creatinine Ratio: 44.4     Lab results are ok. Please pay closer attention to your diet in order to bring A1C down to 7% or less. 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    Sincerely Yours,    Jacques Navy MD  Patient: [name] Note: All result statuses are Final unless otherwise noted.  Tests: (1) Lipid Panel (LIPID)   Cholesterol               119 mg/dL                   2-130     ATP III Classification            Desirable:  < 200 mg/dL                    Borderline High:  200 - 239 mg/dL               High:  > = 240 mg/dL   Triglycerides             126.0 mg/dL                 8.6-578.4     Normal:  <150 mg/dL     Borderline High:  696 - 199 mg/dL   HDL                       29.52 mg/dL                 >84.13   VLDL Cholesterol          25.2 mg/dL                  2.4-40.1   LDL Cholesterol           53 mg/dL                    0-27  CHO/HDL Ratio:  CHD Risk                             3                    Men          Women     1/2 Average Risk     3.4          3.3     Average Risk          5.0  4.4     2X Average Risk          9.6          7.1     3X Average Risk          15.0          11.0                            Tests: (2) Hepatic/Liver Function Panel (HEPATIC)   Total Bilirubin           0.8 mg/dL                   1.6-1.0   Direct Bilirubin          0.2 mg/dL                   9.6-0.4   Alkaline Phosphatase      64 U/L                      39-117   AST                       28 U/L                      0-37   ALT                       26 U/L                      0-53   Total Protein             6.4 g/dL                    5.4-0.9   Albumin                   4.4 g/dL                    8.1-1.9  Tests: (3) BMP (METABOL)   Sodium                    139 mEq/L                   135-145   Potassium                 4.2 mEq/L                   3.5-5.1   Chloride                  104 mEq/L                   96-112   Carbon Dioxide            27 mEq/L                    19-32   Glucose              [H]  172 mg/dL                   14-78   BUN                       14  mg/dL                    1-61   Creatinine                0.7 mg/dL                   0.9-6.0   Calcium                   9.1 mg/dL                   4.5-40.9   GFR                       124.62 mL/min               >60.00  Tests: (4) Hemoglobin A1C (A1C)   Hemoglobin A1C       [H]  7.4 %                       4.6-6.5     Glycemic Control Guidelines for People with Diabetes:     Non Diabetic:  <6%     Goal of Therapy: <7%     Additional Action Suggested:  >8%   Tests: (5) B12 + Folate Panel (B12/FOL)   Vitamin B12               336 pg/mL                   211-911   Folate                    11.7 ng/mL                  >5.9

## 2010-06-25 ENCOUNTER — Telehealth: Payer: Self-pay | Admitting: Internal Medicine

## 2010-06-26 LAB — GLUCOSE, CAPILLARY
Glucose-Capillary: 161 mg/dL — ABNORMAL HIGH (ref 70–99)
Glucose-Capillary: 163 mg/dL — ABNORMAL HIGH (ref 70–99)
Glucose-Capillary: 162 mg/dL — ABNORMAL HIGH (ref 70–99)
Glucose-Capillary: 167 mg/dL — ABNORMAL HIGH (ref 70–99)

## 2010-06-30 ENCOUNTER — Telehealth: Payer: Self-pay | Admitting: Internal Medicine

## 2010-07-01 NOTE — Progress Notes (Signed)
Summary: medication  Phone Note Call from Patient Call back at Home Phone [phone]   Caller: Patient Summary of Call: Patient called [facility] stating that medication was changed at last ov but [facility] has not seen anything.Marland KitchenMarland KitchenMarland KitchenAlvy Beal Archie CMA  June 25, 2010 11:37 AM   Follow-up for Phone Call         Spoke w/Pt about med inquiry. Pt states that he was to have a medication dosage change on Toprol 50mg  (1) once daily to begin Toprol 25mg  two times a day. Will call Rx in to [facility] after receiving clarification. I do not see this change in last OV note. Please advise. Follow-up by: Burnard Leigh [facility]),  June 25, 2010 12:25 PM  Additional Follow-up for Phone Call Additional follow up Details #1::        it was discussed in the HPI to switch to gneric metoprolol 25mg  two times a day from branded toprol XL. So.... metorpolol 25mg  two times a day, # 180, 3 refills. please up-date med list Additional Follow-up by: Jacques Navy MD,  June 25, 2010 12:56 PM    Additional Follow-up for Phone Call Additional follow up Details #2::    metoprolol25mg  (1) tab by mouth two times a day sent to [facility] [place]. LMOM to inform Pt of Rx status as requested. Follow-up by: Burnard Leigh [facility]),  June 25, 2010 6:50 PM  New/Updated Medications: METOPROLOL SUCCINATE 25 MG XR24H-TAB (METOPROLOL SUCCINATE) Take 1 Tablet by mouth two times a day Prescriptions: METOPROLOL SUCCINATE 25 MG XR24H-TAB (METOPROLOL SUCCINATE) Take 1 Tablet by mouth two times a day  #180 x 3   Entered by:   Vertis Kelch)   Authorized by:   Jacques Navy MD   Signed by:   Burnard Leigh CMA(AAMA) on 06/25/2010   Method used:   Electronically to        [facility] [id number] (retail)       [address] [place], [place]  [address]       Ph: [phone]       Fax: [phone]   RxID:   [id number]

## 2010-07-04 ENCOUNTER — Telehealth: Payer: Self-pay | Admitting: *Deleted

## 2010-07-04 MED ORDER — METOPROLOL TARTRATE 25 MG PO TABS: 25.0000 mg | ORAL_TABLET | Freq: Two times a day (BID) | ORAL | Status: DC

## 2010-07-04 NOTE — Telephone Encounter (Signed)
Ok for change - 25 mg bid is correct, #60, refills prn

## 2010-07-04 NOTE — Telephone Encounter (Signed)
Patient requesting to change from metoprolol succinate 25mg  bid to Tartrate. Ok to change? Would it be 25mg  bid?

## 2010-07-04 NOTE — Telephone Encounter (Signed)
New rx phoned in.......Marland KitchenPt informed

## 2010-07-10 NOTE — Progress Notes (Signed)
  Phone Note Refill Request Message from:  Fax from Pharmacy on June 30, 2010 5:15 PM  Refills Requested: Medication #1:  METOPROLOL SUCCINATE 25 MG XR24H-TAB Take 1 Tablet by mouth two times a day Initial call taken by: Ami Bullins CMA,  June 30, 2010 5:15 PM Caller: Patient    Prescriptions: METOPROLOL SUCCINATE 25 MG XR24H-TAB (METOPROLOL SUCCINATE) Take 1 Tablet by mouth two times a day  #180 x 3   Entered by:   Ami Bullins CMA   Authorized by:   Jacques Navy MD   Signed by:   Bill Salinas CMA on 06/30/2010   Method used:   Electronically to        [id number] (retail)       [address] [place], [place]  [address]       Ph: [phone]       Fax: [phone]   RxID:   [id number]

## 2010-07-12 ENCOUNTER — Other Ambulatory Visit: Payer: Self-pay | Admitting: Internal Medicine

## 2010-07-18 LAB — CBC
Platelets: 196 10*3/uL (ref 150–400)
RDW: 12.9 % (ref 11.5–15.5)
WBC: 6.2 10*3/uL (ref 4.0–10.5)

## 2010-07-18 LAB — GLUCOSE, CAPILLARY
Glucose-Capillary: 151 mg/dL — ABNORMAL HIGH (ref 70–99)
Glucose-Capillary: 171 mg/dL — ABNORMAL HIGH (ref 70–99)
Glucose-Capillary: 179 mg/dL — ABNORMAL HIGH (ref 70–99)

## 2010-07-18 LAB — BASIC METABOLIC PANEL
BUN: 19 mg/dL (ref 6–23)
Calcium: 9.4 mg/dL (ref 8.4–10.5)
Creatinine, Ser: 0.69 mg/dL (ref 0.4–1.5)
GFR calc non Af Amer: >60 mL/min (ref >60)

## 2010-07-18 LAB — WOUND CULTURE: Culture: NO GROWTH

## 2010-07-18 LAB — ANAEROBIC CULTURE

## 2010-07-19 LAB — POCT I-STAT, CHEM 8
Glucose, Bld: 192 mg/dL — ABNORMAL HIGH (ref 70–99)
HCT: 49 % (ref 39.0–52.0)
Hemoglobin: 16.7 g/dL (ref 13.0–17.0)
Potassium: 4 mEq/L (ref 3.5–5.1)
Sodium: 140 mEq/L (ref 135–145)
TCO2: 23 mmol/L (ref 0–100)

## 2010-07-22 LAB — CARDIAC PANEL(CRET KIN+CKTOT+MB+TROPI)
CK, MB: 2.2 ng/mL (ref 0.3–4.0)
CK, MB: 2.5 ng/mL (ref 0.3–4.0)
Relative Index: INVALID (ref 0.0–2.5)
Total CK: 61 U/L (ref 7–232)
Total CK: 84 U/L (ref 7–232)
Troponin I: 0.2 ng/mL — ABNORMAL HIGH (ref 0.00–0.06)

## 2010-07-22 LAB — BASIC METABOLIC PANEL
BUN: 10 mg/dL (ref 6–23)
CO2: 26 mEq/L (ref 19–32)
Calcium: 8.7 mg/dL (ref 8.4–10.5)
GFR calc non Af Amer: >60 mL/min (ref >60)
Glucose, Bld: 168 mg/dL — ABNORMAL HIGH (ref 70–99)
Potassium: 3.7 mEq/L (ref 3.5–5.1)

## 2010-07-22 LAB — COMPREHENSIVE METABOLIC PANEL
ALT: 24 U/L (ref 0–53)
CO2: 27 mEq/L (ref 19–32)
Calcium: 8.9 mg/dL (ref 8.4–10.5)
Creatinine, Ser: 0.79 mg/dL (ref 0.4–1.5)
GFR calc non Af Amer: >60 mL/min (ref >60)
Glucose, Bld: 154 mg/dL — ABNORMAL HIGH (ref 70–99)
Sodium: 134 mEq/L — ABNORMAL LOW (ref 135–145)
Total Protein: 6.4 g/dL (ref 6.0–8.3)

## 2010-07-22 LAB — PROTIME-INR
INR: 1.1 (ref 0.00–1.49)
Prothrombin Time: 14.1 seconds (ref 11.6–15.2)

## 2010-07-22 LAB — TSH: TSH: 2.497 u[IU]/mL (ref 0.350–4.500)

## 2010-07-22 LAB — CBC
HCT: 40.7 % (ref 39.0–52.0)
Hemoglobin: 15.1 g/dL (ref 13.0–17.0)
MCHC: 35.1 g/dL (ref 30.0–36.0)
MCHC: 35.7 g/dL (ref 30.0–36.0)
MCV: 89.8 fL (ref 78.0–100.0)
Platelets: 142 10*3/uL — ABNORMAL LOW (ref 150–400)
RBC: 4.79 MIL/uL (ref 4.22–5.81)
RDW: 12.4 % (ref 11.5–15.5)
RDW: 12.6 % (ref 11.5–15.5)

## 2010-07-22 LAB — LIPID PANEL
LDL Cholesterol: 116 mg/dL — ABNORMAL HIGH (ref 0–99)
Triglycerides: 306 mg/dL — ABNORMAL HIGH (ref <150)
VLDL: 61 mg/dL — ABNORMAL HIGH (ref 0–40)

## 2010-07-22 LAB — GLUCOSE, CAPILLARY
Glucose-Capillary: 178 mg/dL — ABNORMAL HIGH (ref 70–99)
Glucose-Capillary: 204 mg/dL — ABNORMAL HIGH (ref 70–99)

## 2010-07-22 LAB — BRAIN NATRIURETIC PEPTIDE: Pro B Natriuretic peptide (BNP): 882 pg/mL — ABNORMAL HIGH (ref 0.0–100.0)

## 2010-07-22 LAB — HEMOGLOBIN A1C
Hgb A1c MFr Bld: 6.9 % — ABNORMAL HIGH (ref 4.6–6.1)
Mean Plasma Glucose: 151 mg/dL

## 2010-07-28 ENCOUNTER — Telehealth: Payer: Self-pay | Admitting: *Deleted

## 2010-07-28 MED ORDER — FREESTYLE LANCETS MISC: Status: AC

## 2010-07-28 NOTE — Telephone Encounter (Signed)
refills  

## 2010-08-05 ENCOUNTER — Ambulatory Visit (INDEPENDENT_AMBULATORY_CARE_PROVIDER_SITE_OTHER): Payer: Medicare Other | Admitting: Internal Medicine

## 2010-08-05 ENCOUNTER — Encounter: Payer: Self-pay | Admitting: Internal Medicine

## 2010-08-05 VITALS — BP 134/82 | HR 63 | Temp 98.1°F | Ht 66.0 in | Wt 171.4 lb

## 2010-08-05 DIAGNOSIS — E119 Type 2 diabetes mellitus without complications: Secondary | ICD-10-CM

## 2010-08-05 DIAGNOSIS — J329 Chronic sinusitis, unspecified: Secondary | ICD-10-CM

## 2010-08-05 DIAGNOSIS — G4762 Sleep related leg cramps: Secondary | ICD-10-CM

## 2010-08-05 HISTORY — DX: Sleep related leg cramps: G47.62

## 2010-08-05 MED ORDER — CYCLOBENZAPRINE HCL 10 MG PO TABS: 10.0000 mg | ORAL_TABLET | Freq: Every evening | ORAL | Status: DC | PRN

## 2010-08-05 MED ORDER — AZITHROMYCIN 250 MG PO TABS: ORAL_TABLET | ORAL | Status: AC

## 2010-08-05 MED ORDER — METFORMIN HCL 500 MG PO TABS: ORAL_TABLET | ORAL | Status: DC

## 2010-08-05 NOTE — Patient Instructions (Signed)
Take all new medications as prescribed - the antibiotic, and the flexeril at night Increase the metformin to 1000 mg in the AM, and 500 mg in the PM Continue all other medications as before Please keep your appointments with your PCP as you have planned in May, 2012

## 2010-08-05 NOTE — Assessment & Plan Note (Signed)
Mild to mod, for antibx course, mucinex otc prn,  to f/u any worsening symptoms or concerns

## 2010-08-05 NOTE — Assessment & Plan Note (Signed)
Uncontrolled with recent a1c > 7.0 and CBG' ave high 100's;  To increase themetformin to 1000AM, 500PM, f/u with PCP next month with cont'd cbg monitoring, diet , exercise , wt loss efforts  Lab Results  Component Value Date   HGBA1C 7.4* 06/17/2010

## 2010-08-05 NOTE — Assessment & Plan Note (Signed)
Ok to try limited flexeril trial 10 qhs prn for chronic recurrent symptoms, has CPX with PCP next month  Lab Results  Component Value Date   WBC 6.6 08/13/2009   HGB 15.4 08/13/2009   HCT 44.7 08/13/2009   PLT 177.0 08/13/2009   CHOL 119 06/17/2010   TRIG 126.0 06/17/2010   HDL 41.20 06/17/2010   LDLDIRECT 112.2 10/16/2009   ALT 26 06/17/2010   AST 28 06/17/2010   NA 139 06/17/2010   K 4.2 06/17/2010   CL 104 06/17/2010   CREATININE 0.7 06/17/2010   BUN 14 06/17/2010   CO2 27 06/17/2010   TSH 1.84 08/13/2009   PSA 0.31 08/13/2009   INR 1.1 08/31/2008   HGBA1C 7.4* 06/17/2010   MICROALBUR 5.4* 09/15/2006

## 2010-08-10 ENCOUNTER — Encounter: Payer: Self-pay | Admitting: Internal Medicine

## 2010-08-10 NOTE — Progress Notes (Signed)
Subjective:    Patient ID: [name], male    DOB: [birth year], 77 y.o.   MRN: [id number]  HPI  Here with 3 days acute onset fever, facial pain, pressure, general weakness and malaise, and greenish d/c, with slight ST, but little to no cough and Pt denies chest pain, increased sob or doe, wheezing, orthopnea, PND, increased LE swelling, palpitations, dizziness or syncope.   Pt denies polydipsia, polyuria, or low sugar symptoms such as weakness or confusion improved with po intake.  Pt states overall good compliance with meds, trying to follow lower cholesterol, diabetic diet, wt overall stable.   CBG's have been mild elevated close to 200 fairly consistently in thepast few wks, ? Assoc with ESI per Dr Danielle Dess.   Does have occasional LE leg cramps as night which really bothers him, tends to disrupt sleep.  Pt denies new neurological symptoms such as new headache, or facial or extremity weakness or numbness   Pt denies fever, wt loss, night sweats, loss of appetite, or other constitutional symptoms except with current symptoms  Past Medical History  Diagnosis Date  . HERPES SIMPLEX INFECTION 01/19/2007  . TINEA PEDIS 11/15/2009  . DIABETES MELLITUS 01/19/2007  . HYPERLIPIDEMIA 01/19/2007  . DEPRESSION, CHRONIC 06/24/2007  . OBSTRUCTIVE SLEEP APNEA 01/19/2007  . Carpal tunnel syndrome 03/24/2007  . HYPERTENSION 01/19/2007  . Intermediate coronary syndrome 08/29/2008  . CORONARY ARTERY DISEASE 01/19/2007  . URI 01/11/2009  . OSTEOARTHRITIS 06/24/2007  . SHOULDER PAIN, LEFT 03/24/2007  . SPINAL STENOSIS, LUMBAR 06/03/2009  . BACK PAIN, CHRONIC 04/18/2009  . PLANTAR FASCIITIS, RIGHT 01/19/2007  . LEG CRAMPS, NOCTURNAL 10/28/2007  . HEART MURMUR, SYSTOLIC 01/02/2008  . Diarrhea 02/23/2007  . BENIGN PROSTATIC HYPERTROPHY, HX OF 01/19/2007  . PERCUTANEOUS TRANSLUMINAL CORONARY ANGIOPLASTY, HX OF 01/19/2007  . Other postprocedural status 01/19/2007  . DIABETES MELLITUS, TYPE II, WITH NEUROLOGICAL COMPLICATIONS  06/17/2010  . Leg cramps, sleep related 08/05/2010   Past Surgical History  Procedure Date  . Arthroscopy, knee of hx   . Plastic joint in thumb   . Inguinal herniorrhapy, hx   . Percutaneous transluminal coronary angioplasty, hx of 2004    Dr. Chales Abrahams  . Ptca/stent 2004    reports that he has quit smoking. He does not have any smokeless tobacco history on file. He reports that he does not drink alcohol or use illicit drugs. family history includes Coronary artery disease in his other and Diabetes in his other. No Known Allergies Current Outpatient Prescriptions on File Prior to Visit  Medication Sig Dispense Refill  . CRESTOR 20 MG tablet TAKE 1 TABLET BY MOUTH ONCE A DAY  30 tablet  2  . Lancets (FREESTYLE) lancets PRN  60 each  5  . metoprolol tartrate (LOPRESSOR) 25 MG tablet Take 1 tablet (25 mg total) by mouth 2 (two) times daily.  60 tablet  11   Review of Systems All otherwise neg per pt     Objective:   Physical Exam BP 134/82  Pulse 63  Temp(Src) 98.1 F (36.7 C) (Oral)  Ht 5\' 6"  (1.676 m)  Wt 171 lb 6 oz (77.735 kg)  BMI 27.66 kg/m2  SpO2 95% Physical Exam  VS noted Constitutional: Pt appears well-developed and well-nourished.  Bilat tm's mild erythema.  Sinus tender bilat .  Pharynx mild erythema, no exudate HENT: Head: Normocephalic.  Right Ear: External ear normal.  Left Ear: External ear normal.  Eyes: Conjunctivae and EOM are normal. Pupils are equal, round, and  reactive to light.  Neck: Normal range of motion. Neck supple.  Cardiovascular: Normal rate and regular rhythm.   Pulmonary/Chest: Effort normal and breath sounds normal.  Neurological: Pt is alert. No cranial nerve deficit.  Skin: Skin is warm. No erythema.  Psychiatric: Pt behavior is normal. Thought content normal.         Assessment & Plan:

## 2010-08-26 ENCOUNTER — Encounter: Payer: Self-pay | Admitting: Internal Medicine

## 2010-08-26 ENCOUNTER — Telehealth: Payer: Self-pay | Admitting: *Deleted

## 2010-08-26 ENCOUNTER — Other Ambulatory Visit (INDEPENDENT_AMBULATORY_CARE_PROVIDER_SITE_OTHER): Payer: Medicare Other

## 2010-08-26 ENCOUNTER — Other Ambulatory Visit: Payer: Self-pay | Admitting: Internal Medicine

## 2010-08-26 DIAGNOSIS — E119 Type 2 diabetes mellitus without complications: Secondary | ICD-10-CM

## 2010-08-26 DIAGNOSIS — Z0000 Encounter for general adult medical examination without abnormal findings: Secondary | ICD-10-CM

## 2010-08-26 DIAGNOSIS — I10 Essential (primary) hypertension: Secondary | ICD-10-CM

## 2010-08-26 DIAGNOSIS — Z125 Encounter for screening for malignant neoplasm of prostate: Secondary | ICD-10-CM

## 2010-08-26 DIAGNOSIS — Z0389 Encounter for observation for other suspected diseases and conditions ruled out: Secondary | ICD-10-CM

## 2010-08-26 LAB — CBC WITH DIFFERENTIAL/PLATELET
Basophils Relative: 0.5 % (ref 0.0–3.0)
Eosinophils Absolute: 0.1 10*3/uL (ref 0.0–0.7)
Eosinophils Relative: 1.6 % (ref 0.0–5.0)
HCT: 42.7 % (ref 39.0–52.0)
Hemoglobin: 14.6 g/dL (ref 13.0–17.0)
Lymphs Abs: 1.6 10*3/uL (ref 0.7–4.0)
MCHC: 34.1 g/dL (ref 30.0–36.0)
MCV: 91.9 fl (ref 78.0–100.0)
Monocytes Absolute: 0.7 10*3/uL (ref 0.1–1.0)
Neutro Abs: 3.7 10*3/uL (ref 1.4–7.7)
Neutrophils Relative %: 61.1 % (ref 43.0–77.0)
RBC: 4.65 Mil/uL (ref 4.22–5.81)
WBC: 6.1 10*3/uL (ref 4.5–10.5)

## 2010-08-26 LAB — BASIC METABOLIC PANEL
CO2: 27 mEq/L (ref 19–32)
Calcium: 9.2 mg/dL (ref 8.4–10.5)
Chloride: 103 mEq/L (ref 96–112)
Creatinine, Ser: 0.6 mg/dL (ref 0.4–1.5)
Glucose, Bld: 181 mg/dL — ABNORMAL HIGH (ref 70–99)

## 2010-08-26 LAB — URINALYSIS
Bilirubin Urine: NEGATIVE
Hgb urine dipstick: NEGATIVE
Ketones, ur: NEGATIVE
Total Protein, Urine: NEGATIVE
Urine Glucose: 100
Urobilinogen, UA: 0.2 (ref 0.0–1.0)

## 2010-08-26 LAB — HEPATIC FUNCTION PANEL
AST: 26 U/L (ref 0–37)
Albumin: 3.8 g/dL (ref 3.5–5.2)
Alkaline Phosphatase: 54 U/L (ref 39–117)
Total Bilirubin: 0.7 mg/dL (ref 0.3–1.2)

## 2010-08-26 LAB — LIPID PANEL
LDL Cholesterol: 80 mg/dL (ref 0–99)
Total CHOL/HDL Ratio: 3
Triglycerides: 158 mg/dL — ABNORMAL HIGH (ref 0.0–149.0)

## 2010-08-26 LAB — TSH: TSH: 2.07 u[IU]/mL (ref 0.35–5.50)

## 2010-08-26 MED ORDER — ESZOPICLONE 3 MG PO TABS: 3.0000 mg | ORAL_TABLET | Freq: Every day | ORAL | Status: DC

## 2010-08-26 NOTE — Cardiovascular Report (Signed)
NAMERACHID, PARHAM NO.:  [id number]   MEDICAL RECORD NO.:  [id number]          PATIENT TYPE:  INP   LOCATION:  4729                         FACILITY:  [facility]   PHYSICIAN:  Rollene Rotunda, MD, FACCDATE OF BIRTH:  1932/01/07   DATE OF PROCEDURE:  08/31/2008  DATE OF DISCHARGE:                            CARDIAC CATHETERIZATION   PRIMARY CARE PHYSICIAN:  Rosalyn Gess. Norins, MD.   CARDIOLOGIST:  Verne Carrow, MD.   PROCEDURE:  Left heart catheterization/coronary arteriography.   INDICATIONS:  Evaluate a patient with a non-Q-wave myocardial  infarction.  He had previous stenting of his LAD.   PROCEDURE NOTE:  Left heart catheterization performed via the right  femoral artery.  The artery was cannulated using the anterior wall  puncture.  A #5 French arterial sheath was inserted via the modified  Seldinger technique.  Preformed Judkins and pigtail catheter were  utilized.   Of note, I had difficulty advancing the catheter above the aortic  bifurcation.  Subsequently, it was able to advance using a right  coronary catheter and a J-wire.  All subsequent catheter exchanges  happened via the exchange wire.   RESULTS:  Hemodynamics:  LV 140/17, AO 142/78.  Coronaries:  Left main had luminal irregularities.  The LAD had a  proximal stent, which was widely patent.  There were distal 25% lesions.  First diagonal was large with ostial 25% stenosis.  The circumflex and  the AV groove had long proximal 50% stenosis.  There is mid long 30%  stenosis.  An OM1 was large with ostial 30% stenosis.  An OM2 was large  with proximal 70% stenosis.  Following this, the small segment of the  remaining circumflex did have about an 80% blockage.  The right coronary  artery was large dominant vessel.  There was mid 99% stenosis with TIMI  II flow.  There were diffuse luminal irregularities throughout.  There  was proximal long 25% stenosis and distal 25% stenosis.  The PDA  was  moderate sized with proximal 30% stenosis.  There were 2  posterolaterals, which were moderate to small and normal.  Left ventriculogram:  The left ventriculogram was obtained in RAO  projection.  The EF is 55% with normal wall motion.  Aortogram and  distal aortogram were obtained secondary to difficulty advancing the  guidewire and presumed atherosclerotic disease.  In fact, there was  diffuse nonobstructive plaque with calcification.  However, there were  no high-grade lesions.  Both renals were widely patent.   CONCLUSION:  Severe coronary artery disease.  Preserved ejection  fraction.  No obstructive distal aortic disease.   PLAN:  The patient will have percutaneous revascularization and stenting  of the right coronary by Dr. Juanda Chance.      Rollene Rotunda, MD, [facility]  Electronically Signed     JH/MEDQ  D:  08/31/2008  T:  09/01/2008  Job:  [id number]   cc:   Rosalyn Gess. Norins, MD

## 2010-08-26 NOTE — Discharge Summary (Signed)
[name], [name] NO.:  [id number]   MEDICAL RECORD NO.:  [id number]          PATIENT TYPE:  INP   LOCATION:  2502                         FACILITY:  [facility]   PHYSICIAN:  Andrew Ora, MD           DATE OF BIRTH:  08/13/1931   DATE OF ADMISSION:  08/30/2008  DATE OF DISCHARGE:  09/01/2008                               DISCHARGE SUMMARY   PRIMARY CARE DOCTOR:  Andrew Gess. Norins, MD   BRIEF HISTORY AND PHYSICAL:  [name] is a very nice 77 year old male  with history of diabetes, hyperlipidemia, heart disease, sleep apnea,  and hypertension who presented to the office with 1- to 2-week history  of substernal chest pain, dyspnea on exertion, and ankle edema.  He  reports no follow up for his heart in many years.   PHYSICAL EXAMINATION:  GENERAL:  The patient was alert, oriented, and  well-developed.  VITAL SIGNS:  Blood pressure 148/90, pulse 67, and O2 sat 96%.  LUNGS:  Normal respiratory effort, some crackles at the bases.  HEART:  Regular rate and rhythm with mild systolic murmur.  ABDOMEN:  Soft and nontender.  Normal bowel sounds.   LABORATORY AND X-RAYS:  Chest x-rays show stable chronic lung disease.  Initial CBC showed white count of 6.8 with a hemoglobin of 15.1 and  platelets of 169.  BNP was 882, hemoglobin A1c was 6.9, TSH was 2.4.  Initial creatinine was 0.7 with a potassium of 3.6.  Total cholesterol  205, HDL 28, and LDL 116.  Before discharge, his creatinine was stable  at 0.7 and his hemoglobin was 14.5.   HOSPITAL COURSE:  The patient was admitted to the hospital with chest  pain.  Cardiac enzymes show elevated troponin to 0.1 and 0.2.  EKG  showed sinus rhythm with no acute ischemic changes.  Cardiology was  consulted due to his non-Q-wave MI.  They performed a cardiac  catheterization on Aug 31, 2008, and they found severe coronary artery  disease with a preserved ejection fraction.  Nonobstructive distal  aortic disease.  Subsequently, he  underwent a successful PCI of the mid  right coronary artery lesion using bare-metal stent.  Please see the  cardiac catheterization note for details.  Following the procedure, the  patient was kept in bedrest overnight, he became slightly confused.  The  etiology of the confusion is unclear, this may have been sundowning or  side effects from medications.  On rounds this morning, he is alert and  oriented.  He remember being slightly confused, he thinks due to very  beaded drain.  His speech is completely normal and he has no motor  deficits this morning.  He was evaluated by Cardiology and deemed to be  able to go home.  Consequently, at this point, he has reached maximal  hospital benefit and he will be discharged with the following  instructions.   DISCHARGE MEDICATIONS:  1. Toprol-XL 50 mg 1 p.o. daily.  2. Neurontin 600 mg 1 or 2 a day as before.  3. Finasteride 5 mg  1 p.o. daily as before.  4. Crestor 20 mg 1 a day, which is a new medication.  5. Aspirin 325 mg 1 a day.  6. Lasix 40 mg 1 a day.  7. Plavix 75 mg 1 a day, which is a new medication for the patient.  8. Lunesta as before.  9. Tylenol as needed for pain.   DISCHARGE INSTRUCTIONS:  1. The patient is instructed to take nitroglycerin and call 911 if he      has recurrent chest pain.  2. He is asked to see Dr. Illene Regulus, his primary care doctor in      10 days.  3. Cardiology follow up arranged by Cardiology.  I advised the patient      to call them if he does not get an appointment in few days.   ADMITTING DIAGNOSIS:  Chest pain.   DISCHARGE DIAGNOSES:  1. Non-Q-wave myocardial infarction, status post a right coronary      artery bare-metal stent.  2. High cholesterol, now on Crestor.  3. Diabetes, on diet control with hemoglobin A1c during this admission      with 6.9.  4. Hypertension.  No modification of his blood pressure medication was      done at the hospital.  His blood pressure in-house was  stable.  5. History of sleep apnea.    DICTATION ENDED AT THIS POINT      Andrew Ora, MD  Electronically Signed     JP/MEDQ  D:  09/01/2008  T:  09/01/2008  Job:  [id number]   cc:   Andrew Gess. Norins, MD

## 2010-08-26 NOTE — Consult Note (Signed)
NAMEWOFFORD, STRATTON NO.:  [id number]   MEDICAL RECORD NO.:  [id number]          PATIENT TYPE:  INP   LOCATION:  2502                         FACILITY:  [facility]   PHYSICIAN:  Verne Carrow, MDDATE OF BIRTH:  1931-08-22   DATE OF CONSULTATION:  08/31/2008  DATE OF DISCHARGE:                                 CONSULTATION   PRIMARY CARE PHYSICIAN:  Rosalyn Gess. Norins, MD   PRIMARY CARDIOLOGIST:  Was Veneda Melter, MD, will now be Verne Carrow, MD   CHIEF COMPLAINT:  Chest pain.   HISTORY OF PRESENT ILLNESS:  Mr. Fessel is a 77 year old male with known  coronary artery disease.  He has a several-week history of exertional  chest pain.  It radiates to both sides of his jaw and he also feels that  it goes through his back and down his left arm at times.  It is not  severe and only reaches a 3 or 04/10.  Within the last week, he has had  at least 1 episode that started at rest.  On Aug 29, 2008, he had his  worst episode when he was emotionally upset.  His symptoms have  consistently resolved in about 10 minutes with no therapy.  He has had  some diaphoresis, but no shortness of breath, nausea, or vomiting.  He  spoke with his primary care physician, who recommended admission.  Initially, he refused it, but had more pain and came into the hospital.  His cardiac enzymes were elevated, so we were asked to evaluate him, but  he is currently pain-free.   PAST MEDICAL HISTORY:  1. Status post cardiac catheterization in December 2004 showing LAD      95%, treated with a drug-eluting stent reducing the stenosis to 0,      circumflex 60, OM2 50, RCA 40, PDA 70, EF greater than 55%.  2. Non-ST segment elevation MI in December 2004 prior to cath.  3. Hypertension.  4. Diabetes.  5. Hyperlipidemia.  6. Remote history of tobacco use.  7. Obesity with a body mass index of 30.3.  8. Obstructive sleep apnea.  9. BPH.  10.Depression.  11.Osteoarthritis.  12.History  of plantar fasciitis.   SURGICAL HISTORY:  He is status post cardiac catheterization as well as  left knee surgery, hernia repair, nasal surgery, and thumb surgery.   ALLERGIES:  He has been intolerant to METFORMIN with diarrhea.   CURRENT MEDICATIONS:  1. Aspirin 325 mg daily.  2. DVT Lovenox.  3. Proscar 5 mg a day.  4. Lasix 20 mg b.i.d.  5. Neurontin 600 mg t.i.d.  6. Toprol-XL 50 mg daily.  7. Nitroglycerin paste 1 inch q.6 h.  8. Protonix 40 mg a day.  9. Crestor 20 mg a day.   SOCIAL HISTORY:  He lives in [place] with his wife and is retired  Art therapist.  He has an approximately 25-pack-year history of tobacco  use, but quit in 2002 and drinks a glass of wine at night, but does not  abuse drugs.   FAMILY HISTORY:  His mother died at  age 53 and his father died at 82 and  he has 1 sister living, none of them have any history of coronary artery  disease.   REVIEW OF SYSTEMS:  He has some chronic hearing loss.  He has some  chronic dyspnea on exertion and orthopnea, but denies PND, edema, or  palpitations.  He has not had presyncope.  He occasionally coughs and  denies wheezing.  He has occasional arthralgias, especially when he  works in the yard.  He denies reflux symptoms or melena.  Full 14-point  review of systems is otherwise negative.   PHYSICAL EXAMINATION:  VITAL SIGNS:  Temperature is 97.4, blood pressure  139/91, pulse 56, respiratory rate 20, and O2 saturation 96% on room  air.  GENERAL:  He is a well-developed elderly white male, in no acute  distress.  HEENT:  Normal.  NECK:  There is no lymphadenopathy, thyromegaly, bruit, or JVD noted.  CV:  Heart is regular in rate and rhythm with an S1 and S2 and no  significant murmur, rub, or gallop is noted.  Distal pulses are intact  in all 4 extremities and no femoral bruits are appreciated.  LUNGS:  He has a few rales in the bases, right greater than left, but no  crackles or wheeze.  SKIN:  No rashes  or lesions are noted.  ABDOMEN:  Soft and nontender with active bowel sounds.  EXTREMITIES:  There is no cyanosis, clubbing, or edema noted.  MUSCULOSKELETAL:  There is no joint deformity or effusions and no spine  or CVA tenderness is noted.  NEUROLOGIC:  He is alert and oriented with cranial nerves II through XII  grossly intact.   Chest x-ray shows chronic lung disease, but no acute disease.   EKG; sinus rhythm rate 60 with no acute ischemic changes.   LABORATORY VALUES:  Hemoglobin 15.1, hematocrit 43, WBC 6.8, and  platelets 169.  Sodium 134, potassium 3.6, chloride 101, CO2 of 27, BUN  14, creatinine 0.79, and glucose 154.  TSH 2.497.  Hemoglobin A1c 6.9.  BNP 882. CK-MB 74/2.5 then 84/3.3.  Troponin I 0.20, then 0.19.   IMPRESSION:  Mr. Pasillas is a 77 year old male with known coronary artery  disease, who has been having chest pain fairly consistently with  exertion.  This has also possibly progressed to resting chest pain, at  least 1 episode.  His story is concerning for progression of coronary  artery disease as he had moderate disease in multiple vessels in 2004  when he received a drug-eluting stent to his LAD.  He has elevated  troponins and exertional angina.  Dr. Clifton James  spent greater than 30 minutes with the patient and his wife discussing  the risks and benefits of the cath.  He originally refused  catheterization, but after further discussion, has agreed to proceed.  He is on the schedule for Dr. Antoine Poche.      Theodore Demark, PA-C      Verne Carrow, MD  Electronically Signed    RB/MEDQ  D:  08/31/2008  T:  09/01/2008  Job:  [id number]

## 2010-08-26 NOTE — Telephone Encounter (Signed)
Fax from [employer] [phone], Lunesta 3 mg tablet SIG take one tablet by mouth at bedtime as needed with QTY of 30. Please Advise refill

## 2010-08-26 NOTE — Op Note (Signed)
[name], [name]                 ACCOUNT NO.:  [id number]   MEDICAL RECORD NO.:  [id number]          PATIENT TYPE:  AMB   LOCATION:  DSC                          FACILITY:  [facility]   PHYSICIAN:  [name], Andrew GambleDATE OF BIRTH:  January 06, 1932   DATE OF PROCEDURE:  11/29/2008  DATE OF DISCHARGE:                               OPERATIVE REPORT   JUSTIFICATION:  A 77 year old male started developing numbness and pain  in his right hand several years ago.  Nerve conduction studies were done  at that time shows low-grade carpal tunnel on the right.  Since then he  has had progressive numbness in the hand.  There was some discussion  about carpal tunnel release, at that time the patient deferred.  Over  the years, numbness has increased, which involves especially thumb,  index, long finger and he has a mass over the dorsum of the wrist.  He  is now admitted for decompression carpal tunnel and an exploration of  dorsal ganglion.  Number of years ago, the patient had a silicon joint  implanted in thumb carpometacarpal joint on his right.  Complications  discussed preoperatively.  Questions answered and encouraged.   JUSTIFICATION OUTPATIENT SURGICAL:  Minimal morbidity.   PREOPERATIVE DIAGNOSIS:  Ganglion dorsal aspect, right wrist; right  carpal tunnel.   POSTOPERATIVE DIAGNOSIS:  Granulomatous mass, dorsal and volar capsule,  radiocarpal joint; right carpal tunnel.   OPERATION:  Release right carpal tunnel; synovectomy of flexor tendons,  right wrist; arthrotomy volar and dorsal radiocarpal joint with removal  of large granulomatous mass on the volar and dorsal surface of the  radiocarpal joint.   SURGEON:  [name]. Andrew Malling, MD   ANESTHESIA:  General.   PROCEDURE:  The patient was placed on the operative table in supine  position with pneumatic tourniquet at the right upper arm.  The right  upper extremity was prepped with DuraPrep and draped out in the usual  manner.  The  arm was wrapped out with an Esmarch.  Tourniquet was  elevated.  Attention was first turned to the volar surface of the hand.  Loupe magnification used throughout.  A lazy-S incision started in mid-  palmar space and carried proximally.  Skin edges were retracted.  The  fascia and formis opened and median nerve was identified and isolated.  Curved Mayo scissors placed underneath the transverse ligament, but  above the median nerve to protect the median nerve.  Under direct  vision, a transverse ligament was released out in the mid palmar space.  Excellent decompression was achieved.  There was fair amount of  synovitis about the flexor tendons, and each tendon was elevated and a  synovectomy was done of the flexor tendons.  Once this accomplished,  examination of deep part of the volar canal was done and there was a  mass, which involved the volar radiocarpal joint.  This appeared  granulomatous, discolored, a fairly dark brown lobulated mass with some  yellow discoloration.  This was excised and exited from the radiocarpal  joint forwards.  This probably caused some mass effect on  the median  nerve.   Attention was turned to the dorsum of the hand.  There was a ganglia on  the dorsum of the wrist, and a transverse incision was made in the  standard manner.  Skin edges were retracted.  Dissection was carried  down and again there was a large mass on the dorsal aspect of wrist,  which was a dark brown with yellow streaks, that were lobulated  granulomatous appearance.  The dissection was carried down to dorsum of  the wrist and the entire dorsal capsule of the wrist at the radiocarpal  joint at radioscaphoid and radiolunate area were totally destroyed by  this mass.  The mass was totally excised and debrided.  This was fairly  large.  Degenerative changes were seen at the radiocarpal joint.  The  mass was sent off for microscopic evaluation.  The origin of the mass  was difficult to  determine, it may have come from the thumb  carpometacarpal joint it was a silicon implant in this area.  Complete  decompression was achieved, but again, the entire dorsal capsule at  radial proximal carpal area was destroyed by the mass.  Wound was  irrigated with saline.  The skin edges were then closed with 4-0 nylon  suture, large bulky pressure dressing was applied and the patient  returned to recovery room in excellent condition.  Technically, this  went extremely well.   ADDENDUM:  I believe this may have been a synovitis of the joint caused  by silicone implant, which was done a number of years ago.   DISPOSITION:  1. Percocet for pain.  2. To my office next week.  3. Usual postoperative instructions.      Andrew A. [name], M.D.  Electronically Signed     RAM/MEDQ  D:  11/29/2008  T:  11/29/2008  Job:  [id number]

## 2010-08-26 NOTE — Assessment & Plan Note (Signed)
[facility]                           PRIMARY CARE OFFICE NOTE   NAME:[name], [name]                        MRN:          [id number]  DATE:10/13/2006                            DOB:          1931/07/31    [name] is a 77 year old gentleman who was recently seen by Dr. Thomos Lemons.  He is followed for diabetes, known CAD, hyperlipidemia,  hypertension.  Basically was having fatigue, malaise, and not doing  well.  At the last visit his medications were changed, switching from  glimepiride to metformin.  He also has chronic osteoarthritis with knee  pain, also fatigue and malaise.   Laboratory at that visit revealed a serum glucose of 153, but a  hemoglobin A1c of 6.4%, electrolytes were normal.  Kidney function with  a creatinine of 0.8.  Liver functions were normal.  Cholesterol was 148,  triglycerides were 199, HDL was 40.6, LDL was 68.  Microalbuminuria was  positive at 44.4 mg/g.  Thyroid function was normal.   In the interval, the patient reports he has been working on trying to  improve his diet.  He has been taking his medications as instructed.  He  has lost 5 pounds.   CURRENT MEDICATIONS:  1. Lipitor 40 mg daily.  2. Flomax 0.4 mg daily.  3. Toprol XL 50 mg daily.  4. Lasix 20 mg daily.  5. Avodart 0.5 mg daily.  6. Metformin 500 mg b.i.d.  7. Gabapentin 300 mg nightly.  8. Calcium with D.  9. Fish oil.   REVIEW OF SYSTEMS:  Negative for any constitutional, cardiovascular,  respiratory complaints.   VITAL SIGNS:  Temperature 97.3, blood pressure 140/82, pulse 61, weight  190.  No physical exam conducted.   ASSESSMENT AND PLAN:  1. Diabetes:  The patient was well controlled but is seeming to have      somnolence and problems with hypoglycemic attacks.  He is now doing      much better on Metformin.  I did reassure him that his blood sugar      had been well controlled and encouraged him to follow the diet he      has been on and  to continue his medications.  The patient is to      have a repeat hemoglobin A1c in 3 months.  2. Lipids:  Excellent control.  The patient is to continue his present      medications.  3. Hypertension:  Good control.  The patient is to continue his      present medications.  4. Peripheral neuropathy:  The patient with probable peripheral      neuropathy with tingling and numbness in his hands and feet, most      likely related to diabetic neuropathy.  He is encouraged to      increase his gabapentin to 600 mg nightly.  5. Coronary artery disease:  I stressed to the patient the importance      of maintaining adequate control of the other risk factors,      including hyperlipidemia and diabetes.  Currently, he is pain free      and doing well.  6. Vitamin D deficiency:  The patient did have a vitamin D level      coming back at 18 ng/mL which is vitamin D deficient.  The plan is      to take D two 15,000 units twice a week for 6 months and then to      have a repeat laboratory work.   In summary, he is a very pleasant gentleman, I have encouraged him in  his weight loss efforts.  I want him to continue on his present medical  regimen and lifestyle management to control his cardiac risk factors.  He is started on vitamin D for replacement.  He is asked to return to  see me in 6 months.     Rosalyn Gess Norins, MD  Electronically Signed    MEN/MedQ  DD: 10/13/2006  DT: 10/14/2006  Job #: [id number]   cc:   [name]

## 2010-08-26 NOTE — Telephone Encounter (Signed)
OK for refill x 11

## 2010-08-26 NOTE — Cardiovascular Report (Signed)
[name], Andrew NO.:  [id number]   MEDICAL RECORD NO.:  [id number]          PATIENT TYPE:  INP   LOCATION:  2502                         FACILITY:  [facility]   PHYSICIAN:  Everardo Beals. Juanda Chance, MD, FACCDATE OF BIRTH:  [birth year]   DATE OF PROCEDURE:  08/31/2008  DATE OF DISCHARGE:                            CARDIAC CATHETERIZATION   CLINICAL HISTORY:  Mr. Riggi is 77 year old, and had a Cypher stent  placed by Dr. Chales Abrahams in 2004.  He was recently admitted with chest pain,  and positive troponins consistent with a non-ST-elevation myocardial  infarction.  He underwent catheterization by Dr. Antoine Poche, and was found  to have a 95% stenosis in the mid-right coronary with TIMI II flow.  His  stent in the LAD was widely patent.  He had intermediate lesions in the  circumflex artery.  We elected to proceed with intervention on the right  coronary artery.   The patient had been given 4 chewable aspirin, and was given 600 mg of  Plavix.  He was given antiemetics, bolus, and infusion.  We initially  went in with a JR-4 guide, but this did not work very well.  So, we  switched to an AL-1 6-French guide with side holes.  We passed a PTT  with light support wire down the LAD down the right coronary across the  lesion into the distal right coronary artery.  We predilated the vessel  with a 3.0 x 15-mm apex balloon performing several inflations up to 10  atmospheres for 30 seconds.  We had some difficulty with the balloon  migrating downstream with the inflations.  We then deployed a 3.5 x 80-  mm Vision stent, and deployed this with one inflation up to 14  atmospheres for 30 seconds.  There was a difference in caliber in the  proximal and distal edges of the stent, we went in with a 4.5 x 12-mm Eakly  Voyager, performed one inflation of the distal edge of the 14  atmospheres for 30 seconds, and a second inflation in the mid stent up  to 8 atmospheres for 30 seconds.  Final diagnostics  were then performed  through the guiding catheter.  The patient tolerated the procedure well,  and left the laboratory in satisfactory condition.   RESULTS:  Initially, the stenosis in the midvessel was estimated at 95%  with TIMI II flow.  Following stenting, this improved to 0% with TIMI  III flow.   CONCLUSION:  Successful PCI of the lesion in the mid-right coronary  artery using a Vision bare metal stent with improvement in the center,  narrowing from 95% to 0% and improvement in the flow from TIMI II to  TIMI III flow.      Bruce Elvera Lennox Juanda Chance, MD, [facility]  Electronically Signed     BRB/MEDQ  D:  08/31/2008  T:  09/01/2008  Job:  [id number]   cc:   Rosalyn Gess. Norins, MD  Rollene Rotunda, MD, [facility]

## 2010-08-28 ENCOUNTER — Ambulatory Visit (INDEPENDENT_AMBULATORY_CARE_PROVIDER_SITE_OTHER): Payer: Medicare Other | Admitting: Internal Medicine

## 2010-08-28 ENCOUNTER — Encounter: Payer: Self-pay | Admitting: Internal Medicine

## 2010-08-28 VITALS — BP 138/88 | HR 64 | Temp 97.5°F | Wt 172.0 lb

## 2010-08-28 DIAGNOSIS — E119 Type 2 diabetes mellitus without complications: Secondary | ICD-10-CM

## 2010-08-28 DIAGNOSIS — I10 Essential (primary) hypertension: Secondary | ICD-10-CM

## 2010-08-28 DIAGNOSIS — M48061 Spinal stenosis, lumbar region without neurogenic claudication: Secondary | ICD-10-CM

## 2010-08-28 DIAGNOSIS — Z136 Encounter for screening for cardiovascular disorders: Secondary | ICD-10-CM

## 2010-08-28 DIAGNOSIS — Z0000 Encounter for general adult medical examination without abnormal findings: Secondary | ICD-10-CM

## 2010-08-28 NOTE — Progress Notes (Signed)
Subjective:    Patient ID: [name], male    DOB: September 17, 1931, 77 y.o.   MRN: [id number]  HPI The patient is here for annual Medicare wellness examination and management of other chronic and acute problems. He has been doing well. His chief concern is abdominal girth despite being on no sugar, low carb diet. He has had no major illness or other medical event in the interval since his last visit.    The risk factors are reflected in the social history.  The roster of all physicians providing medical care to patient - is listed in the Snapshot section of the chart.  Activities of daily living:  The patient is 100% inedpendent in all ADLs: dressing, toileting, feeding as well as independent mobility. 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    Home safety : The patient has smoke detectors in the home. They wear seatbelts. firearms are present in the home, kept in a safe fashion. There is no violence in the home. He has had no falls and has no fall risk   There is no risks for hepatitis, STDs or HIV. There is history of blood transfusion. They have no travel history to infectious disease endemic areas of the world.  The patient has seen their dentist in the last six month. They have not seen their eye doctor in the last year. They admit to any hearing difficulty and has had audiologic testing in the last year.  They do not  have excessive sun exposure. Discussed the need for sun protection: hats, long sleeves and use of sunscreen if there is significant sun exposure.   Diet: the importance of a healthy diet is discussed. They do have a healthy (unhealthy-high fat/fast food) diet.  Exercise - no regular exercise program.  Past Medical History  Diagnosis Date  . HERPES SIMPLEX INFECTION 01/19/2007  . TINEA PEDIS 11/15/2009  . DIABETES MELLITUS 01/19/2007  . HYPERLIPIDEMIA 01/19/2007  . DEPRESSION, CHRONIC 06/24/2007  . OBSTRUCTIVE SLEEP APNEA 01/19/2007  . Carpal tunnel  syndrome 03/24/2007  . HYPERTENSION 01/19/2007  . Intermediate coronary syndrome 08/29/2008  . CORONARY ARTERY DISEASE 01/19/2007  . URI 01/11/2009  . OSTEOARTHRITIS 06/24/2007  . SHOULDER PAIN, LEFT 03/24/2007  . SPINAL STENOSIS, LUMBAR 06/03/2009  . BACK PAIN, CHRONIC 04/18/2009  . PLANTAR FASCIITIS, RIGHT 01/19/2007  . LEG CRAMPS, NOCTURNAL 10/28/2007  . HEART MURMUR, SYSTOLIC 01/02/2008  . Diarrhea 02/23/2007  . BENIGN PROSTATIC HYPERTROPHY, HX OF 01/19/2007  . PERCUTANEOUS TRANSLUMINAL CORONARY ANGIOPLASTY, HX OF 01/19/2007  . Other postprocedural status 01/19/2007  . DIABETES MELLITUS, TYPE II, WITH NEUROLOGICAL COMPLICATIONS 06/17/2010  . Leg cramps, sleep related 08/05/2010   Past Surgical History  Procedure Date  . Arthroscopy, knee of hx   . Plastic joint in thumb   . Inguinal herniorrhapy, hx   . Percutaneous transluminal coronary angioplasty, hx of 2004    Dr. Chales Abrahams  . Ptca/stent 2004   Family History  Problem Relation Age of Onset  . Diabetes Other   . Coronary artery disease Other    History   Social History  . Marital Status: Married    Spouse Name: N/A    Number of Children: N/A  . Years of Education: N/A   Occupational History  . 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 Smoking status: Former Smoker    Quit date: 04/14/1983  . Smokeless tobacco: Not on file   Comment: stopped 1985  .  Alcohol Use: No  . Drug Use: No  . Sexually Active: Not on file   Other Topics Concern  . Not on file   Social History Narrative   Married in 1959 - 55yrs, divorced; married 1974 - 1.5 years, divorced; married 1985 1 daughter 25; 34 step daughters; 2 grandchildren.         Review of Systems Review of Systems  Constitutional:  Negative for fever, chills, activity change and unexpected weight change.  HENT:  Negative for hearing loss, ear pain, congestion, neck stiffness and postnasal drip.   Eyes: Negative for pain, discharge and  visual disturbance.  Respiratory: Negative for chest tightness and wheezing.   Cardiovascular: Negative for chest pain and palpitations.       [No decreased exercise tolerance Gastrointestinal: [No change in bowel habit. No bloating or gas. No reflux or indigestion Genitourinary: Negative for urgency, frequency, flank pain and difficulty urinating.  Musculoskeletal: Negative for myalgias, back pain, arthralgias and gait problem.  Neurological: Negative for dizziness, tremors, weakness and headaches.  Hematological: Negative for adenopathy.  Psychiatric/Behavioral: Negative for behavioral problems and dysphoric mood.       Objective:   Physical Exam Vitals reviewed - normal Constitutional: He is oriented to person, place, and time. He appears well-developed and well-nourished.       Healthy appearing white male in no acute distress  HENT:  Head: Normocephalic and atraumatic.  Right Ear: External ear normal. EAC/TM nl Left Ear: External ear normal.  EAC/TM nl Nose: Nose normal.  Mouth/Throat: Oropharynx is clear and moist.  Eyes: Conjunctivae and EOM are normal. Pupils are equal, round, and reactive to light. Right eye exhibits no discharge. Left eye exhibits no discharge. No scleral icterus.  Neck: Normal range of motion. Neck supple. No JVD present. No tracheal deviation present. No thyromegaly present.  Cardiovascular: Normal rate, regular rhythm and normal heart sounds.  Exam reveals no gallop and no friction rub.   No murmur heard.      Quiet precordium. 2+ radial and DP pulses  Pulmonary/Chest: Effort normal. No respiratory distress. He has no wheezes. He has no rales. He exhibits no tenderness.       No chest wall deformity  Abdominal: Soft, protruberant.  Bowel sounds are normal. He exhibits no distension. There is no tenderness. There is no rebound and no guarding.       No heptosplenomegaly  Genitourinary: deferred Musculoskeletal: Normal range of motion. He exhibits no  edema and no tenderness.       Small and large joints without redness, synovial thickening or deformity. Full range of motion preserved about all small, median and large joints.  Lymphadenopathy:    He has no cervical adenopathy.  Neurological: He is alert and oriented to person, place, and time. He has normal reflexes. No cranial nerve deficit. Coordination normal.  Skin: Skin is warm and dry. No rash noted. No erythema.  Psychiatric: He has a normal mood and affect. His behavior is normal. Thought content normal.   Lab Results  Component Value Date   WBC 6.1 08/26/2010   HGB 14.6 08/26/2010   HCT 42.7 08/26/2010   PLT 169.0 08/26/2010   CHOL 165 08/26/2010   TRIG 158.0* 08/26/2010   HDL 53.10 08/26/2010   LDLDIRECT 112.2 10/16/2009   ALT 35 08/26/2010   AST 26 08/26/2010   NA 138 08/26/2010   K 4.8 08/26/2010   CL 103 08/26/2010   CREATININE 0.6 08/26/2010   BUN 14 08/26/2010  CO2 27 08/26/2010   TSH 2.07 08/26/2010   PSA 0.25 08/26/2010   INR 1.1 08/31/2008   HGBA1C 8.8* 08/26/2010   MICROALBUR 5.4* 09/15/2006   Lab Results  Component Value Date   LDLCALC 80 08/26/2010           Assessment & Plan:  1. Diabetes - poor control, driven in part by steroid injections.   Plan - increase metformin to 1000mg  twice a day.            Recheck A1C in 3 months - if above goal will need to add a second agent.  2. Hyperlipidemia - good control on present medication with LDL of 80.  Plan - continue crestor 20mg  daily  3. Hypertension - adequate control on present medications.  4. CAD - s/p angioplasty and bare-metal stent RCA. He has been doing well with no symptoms and no limitations. Risk factors are fairly well controlled except for diabetes. He has not had cardiology follow-up or repeat testing since January '11.   5. Spinal stenosis and back pain - he continues to dodge the surgical bullet. He has had good success with ESI treatments and wishes to continue the same.   6. Depression - he is  in good spirits and appears to be doing well.  7. Health maintenance - interval history is negative. Physical exam is unremarkable. Lab results, except for A1C, are within normal limits. He is current with colorectal cancer screening with last study 2011. Immunization: tetnus Dec 2000; pneumonia vaccine '00; shingles - today.  12 Lead EKG is without signs of ischemia or injury = normal EKG  In summary - a very nice man who is medically stable. He will return for follow-up A1C August 15th or after with recommendations to follow. He will otherwise return as needed.

## 2010-08-29 NOTE — Assessment & Plan Note (Signed)
[facility]                             PRIMARY CARE OFFICE NOTE   NAME:Chisenhall, MOIZ RYANT                        MRN:          [id number]  DATE:10/26/2005                            DOB:          05-Apr-1932    Mr. Derflinger is a 77 year old Caucasian gentleman who presents for followup  evaluation and exam.  He was last seen in the office on March 26, 2005  for a persistent sore throat.  Besides that, the patient has been doing well  with no specific medical complaints.  He does report that he is feeling  tired and fatigued all the time.  He elaborates this day he has lost  interest in most activities and prefers to stay at home.  On further  questioning, he admits to irritability, decreased libido, change in appetite  with difficulty controlling his eating, and poor sleep habit.  The patient  has a prior history of depression intermittently for many, many years.  He  was on Zoloft at one time, but currently is on no medications.   PAST MEDICAL HISTORY/FAMILY HISTORY/SOCIAL HISTORY:  Well documented in my  note of August 05, 2004 with no specific changes.   SOCIAL HISTORY INTERVAL:  It is noteworthy that he has had a grandchild,  whom he is very fond of and enjoys taking care of.  His marriage remains in  good health.   CURRENT MEDICATIONS:  1.  Aspirin 325 mg daily.  2.  Lipitor 40 mg daily.  3.  Flomax 0.4 mg daily.  4.  Toprol-XL 50 mg daily.  5.  Nitroglycerin on a p.r.n. basis.   REVIEW OF SYSTEMS:  The patient has had no fevers, sweats or chills.  He has  had a 2 pound weight gain.  He has not had an eye exam in the last 24  months.  He has had no ENT or dental complaints.  He has occasional mild  chest discomfort, which he says is fleeting.  He does complain of dyspnea on  exertion and mild shortness of breath.  He has occasional heartburn.  The  patient has dysuria x1-2 with no other GU problems.  No musculoskeletal or  neurologic  complaints.  Please see above for discussion of psychosocial  issues and possible depression.   PHYSICAL EXAMINATION:  VITAL SIGNS:  Temperature was 97, blood pressure  153/86, pulse 65, weight 189.  Height is 5 feet, 5 inches.  GENERAL APPEARANCE:  This is a stocky gentleman in no acute distress.  HEENT:  Normocephalic and atraumatic.  EACs and TMs were unremarkable.  Oropharynx with dentition in good repair.  No buccal or palatal lesions were  noted.  Posterior pharynx was clear.  Conjunctivae and sclerae clear.  Pupils equal, round and reactive to light and accommodation.  Extraocular  movements intact.  Funduscopic examination was unremarkable.  NECK:  Supple without thyromegaly.  NODES:  No adenopathy was noted in the cervical or supraclavicular regions.  CHEST:  No CVA tenderness.  LUNGS:  Clear to auscultation and percussion.  CARDIOVASCULAR:  There were 2+ radial pulses.  No JVD or carotid bruits.  He  had a quiet precordium with regular rate and rhythm without murmurs, rubs,  or gallops.  ABDOMEN:  Soft.  No guarding, no rebound.  No organomegaly was appreciated.  GENITALIA:  Unremarkable.  RECTAL:  Normal sphincter tone was noted.  Prostate was smooth.  It had a  normal size and contour.  EXTREMITIES:  Without clubbing, cyanosis, or edema or deformity.  NEUROLOGIC:  Non-focal.   ELECTROCARDIOGRAM:  A 12-lead electrocardiogram revealed normal sinus rhythm  with a bradycardic rate.  Otherwise, unremarkable with no signs of injury.   LABORATORY DATA:  Hemoglobin was 16.1 gm, white count 4500 with a normal  differential.  Chemistries were remarkable for a serum glucose of 147.  Electrolytes were normal.  Kidney function normal with a creatinine of 0.8.  Liver functions normal.  TSH was normal at 2.07.  PSA was normal at 0.94.  Urinalysis was unremarkable.  Cholesterol was 130, triglycerides 88, HDL  35.9, LDL 77.   ASSESSMENT AND PLAN:  1.  Hypertension.  The patient's blood  pressure is poorly controlled.  I      have reviewed the patient's chart, and he has been consistently running      with systolics in the 150s.  Plan - the patient was stated on Lasix 20      mg q.a.m.  2.  Cardiovascular.  The patient is status post PCI with single vessel      stenting of the LAD in December of 2004.  He has had no testing since      that time.  He has minimal complaints as noted with occasional fleeting      chest pain and dyspnea on exertion.  Plan - the patient is scheduled for      rest stress Cardiolite on Friday, October 30, 2005 at 9:45 a.m.  The      patient is aware of this appointment.  He is instructed to not take his      beta blocker that morning and to wear running clothes and to only have      clear liquids.  3.  Lipids.  The patient is concerned that Lipitor may be the cause of some      of his malaise and fatigue.  However, he had an excellent response with      his LDL at goal.  Plan - will continue Lipitor at this time.  4.  Diabetes.  The patient has been diagnosed as a diabetic in the past with      noninsulin-dependent diabetes with diet control only.  Now, the patient      is running an elevated serum glucose.  In reviewing his chart, the      patient's serum glucose has been in the 130-140 range.  I have discussed      this with the patient, particularly the goal of having his serum glucose      at the normal range, preferably in the 100 range, given his history of      heart disease.  Plan - the patient is started on Amaryl 2 mg p.o. daily.  5.  Psychosocial.  In talking with the patient, he has had intermittent      bouts of depression all his adult life.  He, in the past, has seen a      psychiatrist, which he felt was of no help.  He has been on medication      in  the past, which he felt was of minimal help.  He describes his      symptoms as being more lacking in motivation, fatigue, malaise,      anhedonia rather than anxiety and nervousness or  worry.  PLAN:  Trial of Wellbutrin XL 150 mg q.a.m.  The patient to return in 3-4  weeks for reassessment, and, if he tolerates this dose, will advance to 300  mg.  The patient is prescribed Lunesta 3 mg to take q.h.s. on a p.r.n.  basis.   SUMMARY:  A pleasant gentleman with problems as outlined above.  We will do  cardiac stress testing, and, if normal, I will continue his present regimen.  Changes in his medications are noted.  He will return to see me in 3-4  weeks.                                   Rosalyn Gess Norins, MD   MEN/MedQ  DD:  10/27/2005  DT:  10/27/2005  Job #:  [id number]   cc:   Slayden L. Crandall

## 2010-08-29 NOTE — Cardiovascular Report (Signed)
NAMEPRATHER, FAILLA                           ACCOUNT NO.:  [id number]   MEDICAL RECORD NO.:  [id number]                   PATIENT TYPE:  INP   LOCATION:  2907                                 FACILITY:  [facility]   PHYSICIAN:  Veneda Melter, M.D.                   DATE OF BIRTH:  11/27/1931   DATE OF PROCEDURE:  03/16/2003  DATE OF DISCHARGE:  03/17/2003                              CARDIAC CATHETERIZATION   PROCEDURES PERFORMED:  1. Left heart catheterization.  2. Left ventriculogram.  3. Selective coronary angiography.  4. PTCA and stent placement mid left anterior descending.   DIAGNOSES:  1. Coronary atherosclerotic disease.  2. Normal left ventricular systolic function.  3. Non ST elevation myocardial infarction.   HISTORY:  Mr. Wieber is a 77 year old white male who presents with substernal  chest discomfort.  The patient was admitted to the hospital and had  nonspecific ECG changes in the anterior leads.  He subsequently ruled in for  a non ST elevation MI and had recurrence of discomfort.  He is brought to  the catheterization laboratory urgently.   TECHNIQUE:  Informed consent was obtained.  The patient was brought to the  catheterization laboratory.  A 6-French sheath placed in the right femoral  artery using modified Seldinger technique.  A 6-French JL4 and JR4 catheters  were then used to engage the left and right coronary arteries.  Selective  angiography performed in various projections using manual injections of  contrast.  A 6-French pigtail catheter was then advanced left ventricle and  left ventriculogram performed using power injections of contrast.   FINDINGS:  1. Left main trunk:  Large caliber vessel with mild irregularities.  2. LAD:  This begins as a large caliber vessel and tapers as it approaches     the apex.  It provides two diagonal branches as well as a large septal     perforator in the mid section.  The LAD has mild disease in the proximal     segment  of 20%.  There is then a high grade narrowing of 95% with heavy     calcification in the mid section of the vessel.  The distal LAD has     diffuse disease of 30%.  The diagonal branches have mild disease of 30%.  3. Left circumflex artery is a large caliber vessel.  Provides two marginal     branches.  The proximal AV circumflex has moderate narrowing of 60%.  The     first marginal branch has mild disease of 30%.  Second marginal branch     has moderate disease of 50% in the proximal mid section.  4. Right coronary artery is dominant.  This begins as a large caliber vessel     and provides a posterior descending artery and posterior ventricular     branch in terminal segment.  The right coronary has  moderate disease of     40% in the mid section.  The posterior descending artery has a narrowing     of 70% in the proximal segment.  5. LV:  Normal end-systolic and end-diastolic dimensions.  Overall left     ventricular function is well preserved.  Ejection fraction greater than     55%.  No mitral regurgitation.  LV pressure is 130/11.  Aortic is 130/70.     LVEDP equals 23.  Very mild hypokinesis of the distal anterior wall is     noted.   PTCA:  With these findings, we elected to proceed with percutaneous  intervention to the mid LAD.  The patient had been pretreated with aspirin,  heparin, and Integrilin and these were supplemented to maintain ACT of  greater than 250 seconds.  He was given 150 mg of Plavix.  A 6-French CLS4.0  guide catheter was used to engage the left coronary artery, a 0.014 inch  Forte wire advanced in the mid LAD.  This was used to size the vessel  diameter lesion and length.  The wire was positioned distally and a 2.5 x 15  mm Maverick balloon used to pre dilate the lesion at 10 atmospheres for 30  seconds.  A 3.0 x 13 mm Cypher stent was then introduced and carefully  positioned in the mid LAD and deployed at 10 atmospheres for 30 seconds.  The stent delivery  system was repositioned slightly and used to further  dilate the stent at 14 atmospheres for 30 seconds.  Repeat angiography  showed an excellent result with full coverage of the lesion and only mild  residual narrowing.  A 3.25 x 8 mm Quantum Maverick balloon was then used to  post dilate the distal and proximal segments of the stent at 14 and 16  atmospheres, respectively for 30 seconds each.  Repeat angiography showed an  excellent result with no residual stenosis and TIMI 3 flow to the distal LAD  where flow was TIMI 2 to begin with.  Final angiography was performed in  various projection showing no distal vessel damage.  The guide catheter was  then removed and sheath secured in position.  The patient tolerated  procedure well and was transferred to floor in stable condition.   FINAL RESULT:  Successful PTCA and stent placement mid LAD with reduction of  95% narrowing to 0% with placement of 3.0 x 13 mm Cypher drug-eluting stent  dilated to 3.2 mm with improvement of TIMI grade 2 to TIMI grade 3 flow.   ASSESSMENT/PLAN:  Mr. Wilczynski is a 77 year old gentleman status post non ST  elevation myocardial infarction.  He has undergone stent placement in mid  left anterior descending.  Will be placed on Plavix for a minimum six  month's time.  Aggressive risk factor modification will be pursued and he  would benefit from high dose Statin therapy.  The remainder of his coronary  disease will be treated medically at this point.                                               Veneda Melter, M.D.    NG/MEDQ  D:  03/16/2003  T:  03/18/2003  Job:  [id number]   cc:   Almedia Balls. Ranell Patrick, M.D.  [address]  [place]  [place] [address]  Fax: [phone]  Vida Roller, M.D.  Fax: [phone]

## 2010-08-29 NOTE — Op Note (Signed)
NAME:  [name], [name]                           ACCOUNT NO.:  [id number]   MEDICAL RECORD NO.:  [id number]                   PATIENT TYPE:  AMB   LOCATION:  DSC                                  FACILITY:  [facility]   PHYSICIAN:  Rodney A. Chaney Malling, M.D.           DATE OF BIRTH:  1932/01/14   DATE OF PROCEDURE:  06/15/2002  DATE OF DISCHARGE:                                 OPERATIVE REPORT   PREOPERATIVE DIAGNOSIS:  Internal derangement, left knee.   POSTOPERATIVE DIAGNOSES:  1. Medial plica, left knee.  2. Osteoarthritis, anterior aspect of medial femoral condyle, left knee.   PROCEDURES:  1. Arthroscopy, left knee.  2. Debridement of plica.  3. Chondroplasty, inferior aspect medial femoral condyle and posterior     aspect of patella.   SURGEON:  Lenard Galloway. Chaney Malling, M.D.   ANESTHESIA:  MAC.   PATHOLOGY:  With an arthroscope in the knee, a very careful examination of  both compartments was undertaken.  The articular cartilage of the  weightbearing area of the medial femoral condyle, lateral femoral condyle,  and medial and lateral tibial plateau was essentially normal.  There may be  some grade 1 changes in the weightbearing area of the medial compartment.  Both the medial and lateral meniscus appeared normal.  The anterior cruciate  ligament was visualized, and this was normal.  There was a very large medial  plica.  Over the anterior aspect of the medial femoral condyle and femoral  notch area there were some significant arthritic changes.  There were grade  2 and grade 3 changes over most of this area, and some areas had some grade  4 changes.  No loose bodies were seen in the knee.  The synovium appeared  fairly normal.   DESCRIPTION OF PROCEDURE:  The patient was placed on the operating table in  the supine position with a pneumatic tourniquet about the left upper thigh.  The left leg was placed in a leg holder and the entire left lower extremity  prepped with Duraprep  and draped out in the usual manner.  Marcaine was  placed in the knee and Xylocaine and epinephrine used to infiltrate the  puncture wounds.  An infusion cannula was placed in the superior medial  pouch and the knee distended with saline.  Anterior medial and anterior  lateral portals were made, and the arthroscope was introduced.  The findings  were described as above.  Attention was first turned to the large medial  plica, which was debrided very aggressively.  Complete debridement and  excision of the plica was achieved.  Using an intra-articular shaver, some  frayed cartilage over the anterior aspect of the femoral notch area was  debrided, as was the posterior aspect of the patella.  The knee was then  filled with Marcaine, a large bulky compressive dressing applied.  The  patient then returned to the recovery room in  excellent condition.  Technically this procedure went extremely well.                                               Rodney A. Chaney Malling, M.D.   RAM/MEDQ  D:  06/15/2002  T:  06/15/2002  Job:  [id number]

## 2010-08-29 NOTE — Discharge Summary (Signed)
[name], [name]                           ACCOUNT NO.:  [id number]   MEDICAL RECORD NO.:  [id number]                   PATIENT TYPE:  INP   LOCATION:  2907                                 FACILITY:  [facility]   PHYSICIAN:  Vida Roller, M.D.                DATE OF BIRTH:  Dec 03, 1931   DATE OF ADMISSION:  03/15/2003  DATE OF DISCHARGE:  03/17/2003                           DISCHARGE SUMMARY - REFERRING   PROCEDURES:  Coronary artery stenting March 16, 2003.   REASON FOR ADMISSION:  Please refer to Dr. Tinnie Gens Hardin's dictated  consultation note.   LABORATORY DATA:  Cardiac enzymes:  Negative total CPK with peak MB 7.5;  peak troponin-I 0.71, hemoglobin 15.3, platelet 211 at discharge.  Potassium  3.4, BUN 14, creatinine 1.0 at discharge.  Lipid profile pending.   ADMISSION CHEST X-RAY:  Cardiomegaly; mild chronic bronchitic changes.   HOSPITAL COURSE:  The patient was initially seen in consultation for  evaluation of symptoms worrisome for unstable angina pectoris.  He presented  with no previous history of coronary artery disease, but had multiple  cardiac risk factors notable for history of hypertension, dyslipidemia,  family history, and age.   Serial cardiac markers were abnormal and consistent with non-ST elevation MI  (peak troponin-I 0.71).  Additionally, serial EKG tracings revealed dynamic  anterolateral changes.   Patient was maintained on a regimen consisting of intravenous nitroglycerin,  heparin, and Integrilin with aspirin and low-dose beta-blocker.   Cardiac catheterization, performed by Dr. Chales Abrahams (see report for full  details), revealed severe single vessel coronary artery disease with  preserved left ventricular function.  Specifically, there was a 95% mid LAD  lesion - this was successfully stented (Cypher) to 0% residual stenosis with  no noted complications.   The patient was kept for overnight observation and cleared for discharge the  following  morning in hemodynamically stable condition.  There was no  evidence of groin complications on physical exam.  Mild hypokalemia noted  and repleted prior to discharge.   DISCHARGE MEDICATIONS:  1. Plavix 75 mg daily (times six months).  2. Coated aspirin 325 mg daily.  3. Lipitor 80 mg daily.  4. Toprol XL 25 mg daily.  5. Flomax 0.4 mg daily.  6. Nitrostat 0.4 mg p.r.n.   INSTRUCTIONS:  1. No heavy lifting or strenuous activity until seen by physician.  2. Refrain from driving for one week.  3. Maintain low-fat/cholesterol diet.  4. Call the office if there is any swelling/bleeding of the groin.   Arrangements will be made through our office for patient's follow-up with  Dr. Veneda Melter in two weeks.   Patient is also instructed to arrange one week follow-up with his primary  care physician, Dr. Illene Regulus.   DISCHARGE DIAGNOSES:  1. Severe single vessel coronary artery disease/non ST elevation myocardial     infarction.     a. Status post stent (  Cypher) 95% left anterior descending March 16, 2003.     b. Preserved left ventricular function.     c. Mild residual circumflex disease.  2. History of hypertension.  3. Dyslipidemia.  4. Mild hypokalemia.  5. History of sleep apnea.  6. Chronic obstructive pulmonary disease.      Gene Serpe, P.A. [facility]                      Vida Roller, M.D.    GS/MEDQ  D:  03/17/2003  T:  03/18/2003  Job:  [id number]   cc:   Rosalyn Gess. Norins, M.D. [facility]

## 2010-08-29 NOTE — Consult Note (Signed)
NAME:  FENRIS, CAUBLE                           ACCOUNT NO.:  [id number]   MEDICAL RECORD NO.:  [id number]                   PATIENT TYPE:  INP   LOCATION:  3702                                 FACILITY:  [facility]   PHYSICIAN:  Vida Roller, M.D.                DATE OF BIRTH:  1932/02/05   DATE OF CONSULTATION:  03/15/2003  DATE OF DISCHARGE:                                   CONSULTATION   PRIMARY CARE PHYSICIAN:  Dr. Illene Regulus at [facility].   REFERRING PHYSICIAN:  Dr. Rene Paci, internal medicine at [facility].   Mr. Chavarria has no cardiologist.   HISTORY OF PRESENT ILLNESS:  Mr. Hayashi is 77 year old gentleman with no prior  heart disease but a history of hyperlipidemia and hypertension and a distant  history of tobacco abuse who presents with six months of exertional chest  discomfort relieved with rest. This morning, he had a severe episode with  shortness of breath while eating breakfast, midsternal pain up until both  shoulders and into his jaw, all the day down to his left elbow with  shortness of breath and diaphoresis. His wife brought him into the emergency  room where he received some nitroglycerin. The patient eventually resolved  in the ER, and he has been pain free since.   CURRENT MEDICATIONS:  1. Lipitor 10 mg a day.  2. Fosamax.  3. Glucosamine chondroitin sulfate.  4. Quinine.  5. Multivitamin.  6. Prilosec p.r.n.   PAST MEDICAL HISTORY:  1. Significant for hypercholesterolemia.  2. Hypertension on no medication.  3. History of tobacco abuse.  4. No diabetes.  5. There is a family history of coronary artery disease as well.  6. He has also had a left knee arthroscopic surgery, a left inguinal hernia     repair, has had repair of his deviated septum.  7. He has a history of sleep apnea, tried CPAP was unsuccessful.   SOCIAL HISTORY:  Lives in [place] with his wife. He has two children. I  met his daughter. She accompanied him. 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 Used to smoke but quit about two years ago. Has about a 25-pack-year  smoking history. Drinks about three or four glasses of wine a week. Does not  use any drugs or illegal substances.   FAMILY HISTORY:  His mother died at age 41. Father died at age 100. Neither  one of them had coronary disease, but he has a sister, age 67, who has had a  myocardial infarction at a relatively early age.   REVIEW OF SYSTEMS:  Essentially unremarkable except for that mentioned in  the history of present illness.   PHYSICAL EXAMINATION:  GENERAL:  He is a well-developed, morbidly obese,  white male in no acute distress who is alert and oriented x4.  VITAL SIGNS:  His temperature is 97.2, his pulse is 72  and regular in sinus.  Respiratory rate is 18. His blood pressure is 186/99. O2 saturation is 97%  on room air.  HEENT:  Examination of the head, eyes, ears, nose, and throat is  unremarkable.  NECK:  The neck is supple. There is no jugular venous distention or carotid  bruits.  CHEST:  His chest is clear to auscultation.  HEART:  His heart reveals a regular rate and rhythm with a soft S4 with no  murmurs. His point of maximum impulse is not displaced.  ABDOMEN:  His abdomen is soft, nontender, with normal active bowel sounds.  There are no bruits noted.  GENITOURINARY/RECTAL:  Deferred.  EXTREMITIES:  Extremities showed 2+ pulses at both lower extremities. He has  got no bruits. His upper extremities also have normal pulses.  NEUROLOGICAL:  Nonfocal.   STUDIES:  His chest x-ray shows chronic bronchitic changes but no acute  disease. His electrocardiogram shows sinus rhythm at a rate of 72 with  normal axes and normal intervals. There is some minimal ST-T wave changes in  the inferior leads which are nondiagnostic. His white blood cell count is  7.1, H and H of 16 and 47 with a platelet count of 182. Sodium 138,  potassium 3.8, chloride 106, bicarb 24, BUN 17,  creatinine 0.7, and blood  pressure 131. A point of care set of enzymes showed a mildly elevated  troponin. His cardiac enzymes are pending.   ASSESSMENT:  This is a gentleman with what appears to be acute coronary  syndrome who potentially could have a non-ST elevation myocardial  infarction. There are multiple cardiac risk factors including significant  hypertension and is currently hypertensive. It is my recommendation that he  undergo an invasive evaluation. I agree entirely with the heparin and the  Integrilin that has been started as well as the aspirin and Lopressor. He  was also given sublingual nitroglycerin and had IV nitroglycerin. We planned  for a diagnostic catheterization and subsequent coronary stenting if that is  appropriate. I have discussed with him in detail the risks, benefits,  alternatives, and potential outcomes of left heart catheterization as well  as coronary angioplasty stenting. He appears to understand that and wishes  to proceed, and we will accommodate that tomorrow.                                               Vida Roller, M.D.    JH/MEDQ  D:  03/15/2003  T:  03/16/2003  Job:  [id number]

## 2010-09-01 ENCOUNTER — Encounter: Payer: Self-pay | Admitting: Internal Medicine

## 2010-09-22 ENCOUNTER — Other Ambulatory Visit: Payer: Self-pay | Admitting: *Deleted

## 2010-09-22 MED ORDER — METFORMIN HCL 500 MG PO TABS: ORAL_TABLET | ORAL | Status: DC

## 2010-09-29 ENCOUNTER — Ambulatory Visit (INDEPENDENT_AMBULATORY_CARE_PROVIDER_SITE_OTHER): Payer: Medicare Other | Admitting: Internal Medicine

## 2010-09-29 ENCOUNTER — Encounter: Payer: Self-pay | Admitting: Internal Medicine

## 2010-09-29 VITALS — BP 164/100 | HR 69 | Temp 97.5°F | Wt 175.0 lb

## 2010-09-29 DIAGNOSIS — R350 Frequency of micturition: Secondary | ICD-10-CM

## 2010-09-29 MED ORDER — METFORMIN HCL 1000 MG PO TABS: 1000.0000 mg | ORAL_TABLET | Freq: Two times a day (BID) | ORAL | Status: DC

## 2010-09-29 NOTE — Progress Notes (Signed)
Subjective:    Patient ID: [name], male    DOB: [birth year], 77 y.o.   MRN: [id number]  HPI [name] presents due to urinary frequency. He reports having to micturate up to 14 times in a 10 hour period. He has no pain or discomfort. He reports a weak stream and small volumes with micturition. He has a h/o BPH and has been on proscar. He has no signs or symptoms of infection or metabolic abnormality.  Past Medical History  Diagnosis Date  . HERPES SIMPLEX INFECTION 01/19/2007  . TINEA PEDIS 11/15/2009  . DIABETES MELLITUS 01/19/2007  . HYPERLIPIDEMIA 01/19/2007  . DEPRESSION, CHRONIC 06/24/2007  . OBSTRUCTIVE SLEEP APNEA 01/19/2007  . Carpal tunnel syndrome 03/24/2007  . HYPERTENSION 01/19/2007  . Intermediate coronary syndrome 08/29/2008  . CORONARY ARTERY DISEASE 01/19/2007  . URI 01/11/2009  . OSTEOARTHRITIS 06/24/2007  . SHOULDER PAIN, LEFT 03/24/2007  . SPINAL STENOSIS, LUMBAR 06/03/2009  . BACK PAIN, CHRONIC 04/18/2009  . PLANTAR FASCIITIS, RIGHT 01/19/2007  . LEG CRAMPS, NOCTURNAL 10/28/2007  . HEART MURMUR, SYSTOLIC 01/02/2008  . Diarrhea 02/23/2007  . BENIGN PROSTATIC HYPERTROPHY, HX OF 01/19/2007  . PERCUTANEOUS TRANSLUMINAL CORONARY ANGIOPLASTY, HX OF 01/19/2007  . Other postprocedural status 01/19/2007  . DIABETES MELLITUS, TYPE II, WITH NEUROLOGICAL COMPLICATIONS 06/17/2010  . Leg cramps, sleep related 08/05/2010   Past Surgical History  Procedure Date  . Arthroscopy, knee of hx   . Plastic joint in thumb   . Inguinal herniorrhapy, hx   . Percutaneous transluminal coronary angioplasty, hx of 2004    Dr. Chales Abrahams  . Ptca/stent 2004   Family History  Problem Relation Age of Onset  . Diabetes Other   . Coronary artery disease Other    History   Social History  . Marital Status: Married    Spouse Name: N/A    Number of Children: N/A  . Years of Education: N/A   Occupational History  . 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 Smoking  status: Former Smoker    Quit date: 04/14/1983  . Smokeless tobacco: Not on file   Comment: stopped 1985  . Alcohol Use: No  . Drug Use: No  . Sexually Active: Not on file   Other Topics Concern  . Not on file   Social History Narrative   Married in 1959 - 52yrs, divorced; married 1974 - 1.5 years, divorced; married 1985 1 daughter 90; 45 step daughters; 2 grandchildren.       Review of Systems Review of Systems  Constitutional:  Negative for fever, chills, activity change and unexpected weight change.  HEENT:  Negative for hearing loss, ear pain, congestion, neck stiffness and postnasal drip. Negative for sore throat or swallowing problems. Negative for dental complaints.   Eyes: Negative for vision loss or change in visual acuity.  Respiratory: Negative for chest tightness and wheezing.   Cardiovascular: Negative for chest pain and palpitation. No decreased exercise tolerance Gastrointestinal: No change in bowel habit. No bloating or gas. No reflux or indigestion Genitourinary: Positive  for urgency, frequency. Negative for  flank pain and difficulty urinating.  Musculoskeletal: Negative for myalgias, back pain, arthralgias and gait problem.  Neurological: Negative for dizziness, tremors, weakness and headaches.  Hematological: Negative for adenopathy.  Psychiatric/Behavioral: Negative for behavioral problems and dysphoric mood.       Objective:   Physical Exam vitals reviewed and normal Gen' - WNWD white male in no idstress HEENT -  C&S clear Pul - no respiratory distress Cor - RRR      Assessment & Plan:  1. Urinary frequency - suspect possible irritable bladder syndrome.  Plan - trial of toviaz 4mg , if this helps will Rx oxybutynin 5mg  bid.           If Gala Murdoch fails will try adding tamsulosin 0.4 mg qd.           If medical therapy fails will refer to [facility] for cystometrics and possible TUR-P

## 2010-10-07 ENCOUNTER — Telehealth: Payer: Self-pay | Admitting: *Deleted

## 2010-10-07 MED ORDER — DUTASTERIDE 0.5 MG PO CAPS: 0.5000 mg | ORAL_CAPSULE | Freq: Every day | ORAL | Status: DC

## 2010-10-07 NOTE — Telephone Encounter (Signed)
RX sent in

## 2010-10-07 NOTE — Telephone Encounter (Signed)
Pt called requesting a prescription for Avodart be called in to [facility]. Please Advise

## 2010-10-07 NOTE — Telephone Encounter (Signed)
Ok for avodart #30 1 po qd, refill prn

## 2010-11-03 ENCOUNTER — Other Ambulatory Visit: Payer: Self-pay | Admitting: Internal Medicine

## 2010-11-10 ENCOUNTER — Ambulatory Visit (INDEPENDENT_AMBULATORY_CARE_PROVIDER_SITE_OTHER): Payer: Medicare Other | Admitting: Internal Medicine

## 2010-11-10 VITALS — BP 180/92 | HR 60 | Temp 97.9°F | Wt 176.0 lb

## 2010-11-10 DIAGNOSIS — E119 Type 2 diabetes mellitus without complications: Secondary | ICD-10-CM

## 2010-11-10 DIAGNOSIS — I10 Essential (primary) hypertension: Secondary | ICD-10-CM

## 2010-11-10 DIAGNOSIS — M48061 Spinal stenosis, lumbar region without neurogenic claudication: Secondary | ICD-10-CM

## 2010-11-10 LAB — GLUCOSE, POCT (MANUAL RESULT ENTRY): POC Glucose: 180

## 2010-11-10 MED ORDER — LOSARTAN POTASSIUM 100 MG PO TABS: 100.0000 mg | ORAL_TABLET | Freq: Every day | ORAL | Status: DC

## 2010-11-10 MED ORDER — LINAGLIPTIN 5 MG PO TABS: 5.0000 mg | ORAL_TABLET | Freq: Every day | ORAL | Status: DC

## 2010-11-10 NOTE — Patient Instructions (Signed)
Back pain due to spinal stenosis - look into the coverage you can get with [facility] Medicare select and [facility]. If this won't work let me know and I will refer to [facility].  Blood pressure - double the cozaar (losartan) to 100mg  once a day. Use what you have losartan 50mg  two tablets at a time. Bring your BP cuff in and we can calibrate it to our equipment.  Diabetes- will refer to dietician - Cristy Folks as first choice if in network. Continue metform. Add tradjenta 5 mg once a day - see how you tolerate this.  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

## 2010-11-11 NOTE — Assessment & Plan Note (Signed)
Progressive pain and difficulty with activities.  Plan - PT - he will look into coverage and costs if he is referred to [facility] - if too costly will refer to [facility].

## 2010-11-11 NOTE — Assessment & Plan Note (Signed)
Lab Results  Component Value Date   HGBA1C 8.8* 08/26/2010   Subopitmal control on present medication.  Plan - encouraged strict dietary compliance           Continue metformin           Trial of DPP-4 - Tradjenta 5mg  once a day - 14 day sample provided.

## 2010-11-11 NOTE — Assessment & Plan Note (Signed)
BP Readings from Last 3 Encounters:  11/10/10 180/92  09/29/10 164/100  08/28/10 138/88   Poor control, although he feels his BP is better at home.   Plan - increase losartan to 100mg  daily as first step           Continue other medications           Follow BP at home and report back.

## 2010-11-11 NOTE — Progress Notes (Signed)
  Subjective:    Patient ID: [name], male    DOB: [birth year], 77 y.o.   MRN: [id number]  HPI [name] presents for follow-up . His blood sugars have been running high, his blood pressure has also been running high. He continuyes to have progressive back pain due to spinal stenosis. He has been reluctant to consider surgery and recently was advised by insurance co. Nurse line to consider physical therapy. He is interested in pursuing PT to relieve his pain and increase mobility. Of note his pain is intermittent and he has been able to continue with most of his ADLs and pursuits.   I have reviewed the patient's medical history in detail and updated the computerized patient record.    Review of Systems  Constitutional: Negative.   HENT: Negative.   Respiratory: Negative.   Cardiovascular: Negative.   Gastrointestinal: Negative.   Musculoskeletal: Positive for myalgias and back pain.  Neurological: Negative.   Hematological: Negative.        Objective:   Physical Exam Vitals stable Gen'l - WNWD white male in no distress HEENT C&S clear Pul - normal respirations Cor - RRR Neuro - A&O x 3, gait normal       Assessment & Plan:   No problem-specific assessment & plan notes found for this encounter.

## 2010-11-19 ENCOUNTER — Other Ambulatory Visit: Payer: Self-pay | Admitting: Internal Medicine

## 2010-12-18 ENCOUNTER — Telehealth: Payer: Self-pay | Admitting: *Deleted

## 2010-12-18 MED ORDER — SITAGLIPTIN PHOSPHATE 100 MG PO TABS: 100.0000 mg | ORAL_TABLET | Freq: Every day | ORAL | Status: DC

## 2010-12-18 NOTE — Telephone Encounter (Signed)
Pharmacist called. Tradjenta requires PA. Covered alternatives are [place] and/or onglyza. Please advise.

## 2010-12-18 NOTE — Telephone Encounter (Signed)
Go with [place] 100mg  once a day,m #30, refill 11

## 2011-01-03 ENCOUNTER — Other Ambulatory Visit: Payer: Self-pay | Admitting: Internal Medicine

## 2011-01-12 ENCOUNTER — Ambulatory Visit (INDEPENDENT_AMBULATORY_CARE_PROVIDER_SITE_OTHER): Payer: Medicare Other | Admitting: *Deleted

## 2011-01-12 DIAGNOSIS — Z23 Encounter for immunization: Secondary | ICD-10-CM

## 2011-01-13 ENCOUNTER — Other Ambulatory Visit: Payer: Self-pay | Admitting: Internal Medicine

## 2011-01-14 NOTE — Telephone Encounter (Signed)
Ok for refilll prn

## 2011-02-02 ENCOUNTER — Telehealth: Payer: Self-pay | Admitting: Internal Medicine

## 2011-02-02 NOTE — Telephone Encounter (Signed)
Certainly is ok

## 2011-02-06 ENCOUNTER — Encounter: Payer: Self-pay | Admitting: Endocrinology

## 2011-02-06 ENCOUNTER — Ambulatory Visit (INDEPENDENT_AMBULATORY_CARE_PROVIDER_SITE_OTHER)
Admission: RE | Admit: 2011-02-06 | Discharge: 2011-02-06 | Disposition: A | Discharge status: Home / Self Care | Payer: Medicare Other | Source: Ambulatory Visit | Attending: Endocrinology | Admitting: Endocrinology

## 2011-02-06 ENCOUNTER — Ambulatory Visit (INDEPENDENT_AMBULATORY_CARE_PROVIDER_SITE_OTHER): Payer: Medicare Other | Admitting: Endocrinology

## 2011-02-06 VITALS — BP 136/88 | HR 65 | Temp 98.1°F | Ht 66.0 in | Wt 177.0 lb

## 2011-02-06 DIAGNOSIS — R059 Cough, unspecified: Secondary | ICD-10-CM

## 2011-02-06 DIAGNOSIS — R05 Cough: Secondary | ICD-10-CM

## 2011-02-06 MED ORDER — AZITHROMYCIN 500 MG PO TABS: 500.0000 mg | ORAL_TABLET | Freq: Every day | ORAL | Status: AC

## 2011-02-06 NOTE — Patient Instructions (Addendum)
A chest-x-ray is requested for you today.  please call [phone] to hear your test results.  You will be prompted to enter the 9-digit "MRN" number that appears at the top left of this page, followed by #.  Then you will hear the message. i have sent a prescription to your pharmacy, for an antibiotic pill here is a sample of "dulera-200."  take 1 puff 2x a day.  rinse mouth after using. Loratadine-d (non-prescription) will help your congestion. I hope you feel better soon.  If you don't feel better by next week, please call your doctor. (update: i left message on phone-tree:  rx as we discussed). (update: pt is being called, and advised he needs to f/u with [name]).

## 2011-02-06 NOTE — Progress Notes (Signed)
Subjective:    Patient ID: [name], male    DOB: February 02, 1932, 77 y.o.   MRN: [id number]  HPI 1 week of mod prod-quality cough in the chest, and assoc wheezing.  He also has nasal congestion.    Past Medical History  Diagnosis Date  . HERPES SIMPLEX INFECTION 01/19/2007  . TINEA PEDIS 11/15/2009  . DIABETES MELLITUS 01/19/2007  . HYPERLIPIDEMIA 01/19/2007  . DEPRESSION, CHRONIC 06/24/2007  . OBSTRUCTIVE SLEEP APNEA 01/19/2007  . Carpal tunnel syndrome 03/24/2007  . HYPERTENSION 01/19/2007  . Intermediate coronary syndrome 08/29/2008  . CORONARY ARTERY DISEASE 01/19/2007  . URI 01/11/2009  . OSTEOARTHRITIS 06/24/2007  . SHOULDER PAIN, LEFT 03/24/2007  . SPINAL STENOSIS, LUMBAR 06/03/2009  . BACK PAIN, CHRONIC 04/18/2009  . PLANTAR FASCIITIS, RIGHT 01/19/2007  . LEG CRAMPS, NOCTURNAL 10/28/2007  . HEART MURMUR, SYSTOLIC 01/02/2008  . Diarrhea 02/23/2007  . BENIGN PROSTATIC HYPERTROPHY, HX OF 01/19/2007  . PERCUTANEOUS TRANSLUMINAL CORONARY ANGIOPLASTY, HX OF 01/19/2007  . Other postprocedural status 01/19/2007  . DIABETES MELLITUS, TYPE II, WITH NEUROLOGICAL COMPLICATIONS 06/17/2010  . Leg cramps, sleep related 08/05/2010    Past Surgical History  Procedure Date  . Arthroscopy, knee of hx   . Plastic joint in thumb   . Inguinal herniorrhapy, hx   . Percutaneous transluminal coronary angioplasty, hx of 2004    Dr. Chales Abrahams  . Ptca/stent 2004    History   Social History  . Marital Status: Married    Spouse Name: N/A    Number of Children: N/A  . Years of Education: N/A   Occupational History  . 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 Smoking status: Former Smoker    Quit date: 04/14/1983  . Smokeless tobacco: Not on file   Comment: stopped 1985  . Alcohol Use: No  . Drug Use: No  . Sexually Active: Not on file   Other Topics Concern  . Not on file   Social History Narrative   Married in 1959 - 45yrs, divorced; married 1974 - 1.5 years,  divorced; married 1985 1 daughter 24; 81 step daughters; 2 grandchildren.    Current Outpatient Prescriptions on File Prior to Visit  Medication Sig Dispense Refill  . acetaminophen (TYLENOL) 500 MG tablet Take 500 mg by mouth as directed.        Marland Kitchen aspirin 81 MG EC tablet Take 81 mg by mouth daily.        . AVODART 0.5 MG capsule TAKE 1 CAPSULE BY MOUTH ONCE DAILY  30 capsule  2  . CRESTOR 20 MG tablet TAKE 1 TABLET BY MOUTH ONCE A DAY  30 tablet  2  . Eszopiclone (ESZOPICLONE) 3 MG TABS Take 1 tablet (3 mg total) by mouth at bedtime. Take immediately before bedtime  30 tablet  11  . finasteride (PROSCAR) 5 MG tablet Take 5 mg by mouth daily.        Marland Kitchen FREESTYLE LITE test strip USE AS DIRECTED  50 each  3  . furosemide (LASIX) 40 MG tablet Take 40 mg by mouth daily.        . Lancets (FREESTYLE) lancets PRN  60 each  5  . linagliptin (TRADJENTA) 5 MG TABS tablet Take 1 tablet (5 mg total) by mouth daily. For diabetes   30 tablet  11  . losartan (COZAAR) 100 MG tablet Take 1 tablet (100 mg total) by mouth daily.  30 tablet  11  .  meloxicam (MOBIC) 15 MG tablet Take 15 mg by mouth daily.        . metFORMIN (GLUCOPHAGE) 1000 MG tablet Take 1 tablet (1,000 mg total) by mouth 2 (two) times daily with a meal.  180 tablet  3  . metoprolol tartrate (LOPRESSOR) 25 MG tablet Take 1 tablet (25 mg total) by mouth 2 (two) times daily.  60 tablet  11  . sitaGLIPtin (JANUVIA) 100 MG tablet Take 1 tablet (100 mg total) by mouth daily.  30 tablet  11   No Known Allergies  Family History  Problem Relation Age of Onset  . Diabetes Other   . Coronary artery disease Other     BP 136/88  Pulse 65  Temp(Src) 98.1 F (36.7 C) (Oral)  Ht 5\' 6"  (1.676 m)  Wt 177 lb (80.287 kg)  BMI 28.57 kg/m2  SpO2 96%  Review of Systems Denies fever and sob.     Objective:   Physical Exam VITAL SIGNS:  See vs page GENERAL: no distress head: no deformity eyes: no periorbital swelling, no proptosis external  nose and ears are normal mouth: no lesion seen Both eac's and tm's are normal. LUNGS:  Clear to auscultation, except for a few rales at the right base    Cxr: nad Lab Results  Component Value Date   HGBA1C 8.8* 08/26/2010      Assessment & Plan:  Glenford Peers, new Wheezing, prob due to acute bronchitis Abnormal chest sounds, but cxr is nad Dm.  Med list shows 2 dpp-4 inhibitors, and most recent a1c was high

## 2011-02-12 ENCOUNTER — Telehealth: Payer: Self-pay | Admitting: *Deleted

## 2011-02-12 NOTE — Telephone Encounter (Signed)
Per MD, pt is due for F/U with PCP (Dr. Debby Bud) on DM. Left message for pt to callback office.

## 2011-02-12 NOTE — Telephone Encounter (Signed)
Message copied by Carin Primrose on Thu Feb 12, 2011 11:12 AM ------      Message from: Romero Belling      Created: Sat Feb 07, 2011  9:18 AM       please call patient:      He needs f/u with [name], to f/u his dm.      Thank you

## 2011-02-13 NOTE — Telephone Encounter (Signed)
Appointment scheduled 02/16/2011.

## 2011-02-16 ENCOUNTER — Other Ambulatory Visit: Payer: Self-pay | Admitting: Internal Medicine

## 2011-02-16 ENCOUNTER — Encounter: Payer: Self-pay | Admitting: Internal Medicine

## 2011-02-16 ENCOUNTER — Other Ambulatory Visit (INDEPENDENT_AMBULATORY_CARE_PROVIDER_SITE_OTHER): Payer: Medicare Other

## 2011-02-16 ENCOUNTER — Ambulatory Visit (INDEPENDENT_AMBULATORY_CARE_PROVIDER_SITE_OTHER): Payer: Medicare Other | Admitting: Internal Medicine

## 2011-02-16 DIAGNOSIS — E119 Type 2 diabetes mellitus without complications: Secondary | ICD-10-CM

## 2011-02-16 DIAGNOSIS — G4733 Obstructive sleep apnea (adult) (pediatric): Secondary | ICD-10-CM

## 2011-02-16 DIAGNOSIS — E785 Hyperlipidemia, unspecified: Secondary | ICD-10-CM

## 2011-02-16 DIAGNOSIS — I10 Essential (primary) hypertension: Secondary | ICD-10-CM

## 2011-02-16 DIAGNOSIS — I2510 Atherosclerotic heart disease of native coronary artery without angina pectoris: Secondary | ICD-10-CM

## 2011-02-16 DIAGNOSIS — M549 Dorsalgia, unspecified: Secondary | ICD-10-CM

## 2011-02-16 DIAGNOSIS — R252 Cramp and spasm: Secondary | ICD-10-CM

## 2011-02-16 LAB — LIPID PANEL: Cholesterol: 124 mg/dL (ref 0–200)

## 2011-02-16 LAB — COMPREHENSIVE METABOLIC PANEL
BUN: 18 mg/dL (ref 6–23)
CO2: 27 mEq/L (ref 19–32)
Calcium: 9 mg/dL (ref 8.4–10.5)
Chloride: 108 mEq/L (ref 96–112)
Creatinine, Ser: 0.8 mg/dL (ref 0.4–1.5)
GFR: 104.12 mL/min (ref >60.00)

## 2011-02-16 LAB — LDL CHOLESTEROL, DIRECT: Direct LDL: 56.6 mg/dL

## 2011-02-16 MED ORDER — GABAPENTIN 100 MG PO CAPS: 100.0000 mg | ORAL_CAPSULE | Freq: Every day | ORAL | Status: DC

## 2011-02-16 NOTE — Patient Instructions (Signed)
Diabetes - will check anb A1C today with recommendations to follow. You might want to try again with the dietician  Spinal stenosis - glad you are doing better. It seems that there may be more to learn in regard to exercises to help further reduce your back pain and maintain that level of comfort.  Osteoarthritis - knees, etc.  OK to continue the meloxicam and it is ok to take tylenol with the meloxicam. May try glucosamine as well.   Peripheral neuropathy - due to spinal stenosis and/or diabetes - plan start neurontin 100 mg at bedtime, may advance every 7 days by 100 mg up to 400 mg at bedtime.

## 2011-02-16 NOTE — Progress Notes (Signed)
  Subjective:    Patient ID: [name], male    DOB: 1931/05/18, 77 y.o.   MRN: [id number]  HPI [name] presents for follow-up on diabetes. He has never connected with the dietician for diabetes management.   He had knee pain for which he was taking meloxicam- took drug holiday and pain got worse.   For chronic back pain due to spinal stenosis he did have 7 PT visits which he says has in some way helped - he is having a lot less pain from his spinal stenosis. He tried accupuncture which was not helpful after 4 visit.   In general he has been [place] OK with no significant limitations in his activity  I have reviewed the patient's medical history in detail and updated the computerized patient record.    Review of Systems System review is negative for any constitutional, cardiac, pulmonary, GI or neuro symptoms or complaints other than as described in the HPI.     Objective:   Physical Exam Vitals reviewed - BP control is good, weight is too high Gen'l - moderately overweight white man in no distress HEENT- C&S clear Pulm - normal respirations w/p rales or wheeze Cor - 2+ radial, RRR Neuro- A&O x 3, CN II-XII grossly intact with normal balance and gait.       Assessment & Plan:

## 2011-02-18 NOTE — Assessment & Plan Note (Signed)
Stable with no angina, no decrease in exercise tolerance. Last cardiology evaluation Oct '10 Dr. Shirlee Latch  Plan- continue risk modification          Follow up with cardiology for routine evaluation/assessment

## 2011-02-18 NOTE — Assessment & Plan Note (Signed)
Multifactorial including DM, spinal stenosis.  Plan - continue with gabapentin at low dose

## 2011-02-18 NOTE — Assessment & Plan Note (Signed)
Lab Results  Component Value Date   HGBA1C 6.7* 02/16/2011   Good control.  Plan- continue present regimen

## 2011-02-18 NOTE — Assessment & Plan Note (Signed)
Some improvement after PT.  Plan - continue to doe exercises for the back           Consider follow PT visits

## 2011-02-18 NOTE — Assessment & Plan Note (Signed)
Doing OK with CPAP which does seem to be allowing better rest. He will follow-up with pulmonary/sleep medicine as instructed.

## 2011-02-18 NOTE — Assessment & Plan Note (Signed)
BP Readings from Last 3 Encounters:  02/16/11 132/72  02/06/11 136/88  11/10/10 180/92   Adequate control last two visits.  Plan - continue present regimen

## 2011-02-18 NOTE — Assessment & Plan Note (Signed)
LDL better than goal of 80 or less for patient with CAD  Plan - continue present regimen

## 2011-02-23 ENCOUNTER — Encounter: Payer: Self-pay | Admitting: Internal Medicine

## 2011-02-25 ENCOUNTER — Ambulatory Visit: Payer: Medicare Other | Admitting: Cardiology

## 2011-03-02 ENCOUNTER — Other Ambulatory Visit: Payer: Self-pay | Admitting: Internal Medicine

## 2011-03-17 ENCOUNTER — Ambulatory Visit: Payer: Medicare Other | Admitting: Cardiovascular Disease

## 2011-03-18 ENCOUNTER — Telehealth: Payer: Self-pay | Admitting: *Deleted

## 2011-03-18 NOTE — Telephone Encounter (Signed)
Pt wanting cheaper alternative to crestor. Medication is too expensive, Please Advise

## 2011-03-19 ENCOUNTER — Ambulatory Visit (INDEPENDENT_AMBULATORY_CARE_PROVIDER_SITE_OTHER): Payer: Medicare Other | Admitting: Internal Medicine

## 2011-03-19 ENCOUNTER — Encounter: Payer: Self-pay | Admitting: Internal Medicine

## 2011-03-19 VITALS — BP 142/88 | HR 69 | Temp 97.9°F | Resp 16 | Wt 176.2 lb

## 2011-03-19 DIAGNOSIS — N139 Obstructive and reflux uropathy, unspecified: Secondary | ICD-10-CM

## 2011-03-19 DIAGNOSIS — F3289 Other specified depressive episodes: Secondary | ICD-10-CM

## 2011-03-19 DIAGNOSIS — N401 Enlarged prostate with lower urinary tract symptoms: Secondary | ICD-10-CM

## 2011-03-19 DIAGNOSIS — N138 Other obstructive and reflux uropathy: Secondary | ICD-10-CM

## 2011-03-19 DIAGNOSIS — F329 Major depressive disorder, single episode, unspecified: Secondary | ICD-10-CM

## 2011-03-19 DIAGNOSIS — E785 Hyperlipidemia, unspecified: Secondary | ICD-10-CM

## 2011-03-19 MED ORDER — ATORVASTATIN CALCIUM 80 MG PO TABS: 80.0000 mg | ORAL_TABLET | Freq: Every day | ORAL | Status: DC

## 2011-03-19 MED ORDER — TAMSULOSIN HCL 0.4 MG PO CAPS: 0.4000 mg | ORAL_CAPSULE | Freq: Every day | ORAL | Status: DC

## 2011-03-19 MED ORDER — BUPROPION HCL ER (XL) 150 MG PO TB24: 150.0000 mg | ORAL_TABLET | ORAL | Status: DC

## 2011-03-19 NOTE — Patient Instructions (Signed)
Many med changes. Keep me posted: 1. For prostate - tamsulosin, stop avodart 2. For depression - retry the wellbutrin 3. For cholesterol- lipitor 80 (generic) - will need lab work in 1 month - order in, just come by the lab.

## 2011-03-20 NOTE — Telephone Encounter (Signed)
Pt was seen in office on 03/19/2011

## 2011-03-22 DIAGNOSIS — N401 Enlarged prostate with lower urinary tract symptoms: Secondary | ICD-10-CM | POA: Insufficient documentation

## 2011-03-22 DIAGNOSIS — N138 Other obstructive and reflux uropathy: Secondary | ICD-10-CM | POA: Insufficient documentation

## 2011-03-22 NOTE — Assessment & Plan Note (Signed)
C/O nocturia, urinary frequency and signs of prostatism: slow stream, post-void issues, nocturia. He has tried medications in the past with good results  Plan - tamsulosin 0.4 mg qHS

## 2011-03-22 NOTE — Progress Notes (Signed)
  Subjective:    Patient ID: [name], male    DOB: [birth year], 77 y.o.   MRN: [id number]  HPI [name] presents to discuss change in antilipemic medication, crestor being to expensive.   An additional issue which he raised was recurrent depression. This has been a problem in the past for which he was prescribed wellbutrin, subsequently discontinued. He reports that his wife has noticed behaviors c/w recurrent depression including a low motivational state. He is not suicidal or particularly morbid at this visit. He is irritable, has anhedonia, low motivation, emotionally down and he does admit to a share of worry. Libido is down as well.   C/o decreased force of stream, polyuria, nocturia.  I have reviewed the patient's medical history in detail and updated the computerized patient record.    Review of Systems System review is negative for any constitutional, cardiac, pulmonary, GI or neuro symptoms or complaints other than as described in the HPI.     Objective:   Physical Exam Vitals reivewed - normal Gen'l WNWD white man in no acute distress HEENT - C&S clear, PERRLA Pulm- normal respiration, no rales or wheezes Psych - normal affect, not restless, no flight of ideas, able to verbalize his issues.       Assessment & Plan:

## 2011-03-22 NOTE — Assessment & Plan Note (Signed)
Lab Results  Component Value Date   CHOL 124 02/16/2011   HDL 45.50 02/16/2011   LDLCALC 80 08/26/2010   LDLDIRECT 56.6 02/16/2011   TRIG 254.0* 02/16/2011   CHOLHDL 3 02/16/2011   Plan - trial of atorvastatin 80 mg daily           F/u lipid panel 4 weeks

## 2011-03-22 NOTE — Assessment & Plan Note (Signed)
Patient with recurrent depression.  Plan - Wellbutrin XL 150 mg qAM           Recheck 4 weeks

## 2011-04-17 ENCOUNTER — Other Ambulatory Visit (INDEPENDENT_AMBULATORY_CARE_PROVIDER_SITE_OTHER): Payer: Medicare Other

## 2011-04-17 DIAGNOSIS — E785 Hyperlipidemia, unspecified: Secondary | ICD-10-CM

## 2011-04-17 LAB — HEPATIC FUNCTION PANEL
ALT: 27 U/L (ref 0–53)
AST: 24 U/L (ref 0–37)
Bilirubin, Direct: 0.2 mg/dL (ref 0.0–0.3)
Total Bilirubin: 1 mg/dL (ref 0.3–1.2)

## 2011-04-17 LAB — LIPID PANEL
Cholesterol: 110 mg/dL (ref 0–200)
HDL: 46 mg/dL (ref >39.00)

## 2011-04-20 ENCOUNTER — Encounter: Payer: Self-pay | Admitting: Internal Medicine

## 2011-05-05 ENCOUNTER — Telehealth: Payer: Self-pay | Admitting: *Deleted

## 2011-05-05 MED ORDER — MELOXICAM 15 MG PO TABS: 15.0000 mg | ORAL_TABLET | Freq: Every day | ORAL | Status: DC

## 2011-05-05 NOTE — Telephone Encounter (Signed)
Refill request  meloxicam 15mg .

## 2011-05-07 ENCOUNTER — Other Ambulatory Visit: Payer: Self-pay

## 2011-05-19 ENCOUNTER — Other Ambulatory Visit: Payer: Self-pay | Admitting: *Deleted

## 2011-05-19 MED ORDER — FUROSEMIDE 40 MG PO TABS: 40.0000 mg | ORAL_TABLET | Freq: Every day | ORAL | Status: DC

## 2011-05-20 ENCOUNTER — Other Ambulatory Visit: Payer: Self-pay | Admitting: *Deleted

## 2011-05-20 MED ORDER — FUROSEMIDE 40 MG PO TABS: 40.0000 mg | ORAL_TABLET | Freq: Every day | ORAL | Status: DC

## 2011-05-22 ENCOUNTER — Other Ambulatory Visit: Payer: Self-pay | Admitting: *Deleted

## 2011-05-22 NOTE — Telephone Encounter (Signed)
Sent on 02.06.13

## 2011-07-15 ENCOUNTER — Other Ambulatory Visit: Payer: Self-pay | Admitting: *Deleted

## 2011-07-15 MED ORDER — GLUCOSE BLOOD VI STRP: ORAL_STRIP | Status: DC

## 2011-07-15 NOTE — Telephone Encounter (Signed)
Done

## 2011-07-20 ENCOUNTER — Other Ambulatory Visit: Payer: Self-pay | Admitting: *Deleted

## 2011-07-20 MED ORDER — METOPROLOL TARTRATE 25 MG PO TABS: 25.0000 mg | ORAL_TABLET | Freq: Two times a day (BID) | ORAL | Status: DC

## 2011-07-20 MED ORDER — FINASTERIDE 5 MG PO TABS: 5.0000 mg | ORAL_TABLET | Freq: Every day | ORAL | Status: DC

## 2011-07-20 NOTE — Telephone Encounter (Signed)
Done

## 2011-08-06 ENCOUNTER — Ambulatory Visit (INDEPENDENT_AMBULATORY_CARE_PROVIDER_SITE_OTHER): Payer: Medicare Other | Admitting: Internal Medicine

## 2011-08-06 ENCOUNTER — Other Ambulatory Visit (INDEPENDENT_AMBULATORY_CARE_PROVIDER_SITE_OTHER): Payer: Medicare Other

## 2011-08-06 DIAGNOSIS — M48061 Spinal stenosis, lumbar region without neurogenic claudication: Secondary | ICD-10-CM

## 2011-08-06 DIAGNOSIS — E119 Type 2 diabetes mellitus without complications: Secondary | ICD-10-CM

## 2011-08-06 DIAGNOSIS — R252 Cramp and spasm: Secondary | ICD-10-CM

## 2011-08-06 DIAGNOSIS — I10 Essential (primary) hypertension: Secondary | ICD-10-CM

## 2011-08-06 DIAGNOSIS — H9190 Unspecified hearing loss, unspecified ear: Secondary | ICD-10-CM

## 2011-08-06 DIAGNOSIS — E785 Hyperlipidemia, unspecified: Secondary | ICD-10-CM

## 2011-08-06 LAB — LIPID PANEL
HDL: 44.1 mg/dL (ref >39.00)
LDL Cholesterol: 40 mg/dL (ref 0–99)
Total CHOL/HDL Ratio: 2
Triglycerides: 119 mg/dL (ref 0.0–149.0)

## 2011-08-06 LAB — COMPREHENSIVE METABOLIC PANEL
AST: 29 U/L (ref 0–37)
Albumin: 4.2 g/dL (ref 3.5–5.2)
Alkaline Phosphatase: 61 U/L (ref 39–117)
Chloride: 107 mEq/L (ref 96–112)
Potassium: 4.2 mEq/L (ref 3.5–5.1)
Sodium: 142 mEq/L (ref 135–145)
Total Protein: 6.6 g/dL (ref 6.0–8.3)

## 2011-08-06 LAB — HEPATIC FUNCTION PANEL
AST: 29 U/L (ref 0–37)
Albumin: 4.2 g/dL (ref 3.5–5.2)

## 2011-08-06 NOTE — Patient Instructions (Signed)
Spinal stenosis - investigate this laser surgery. There is a literature about this and does seem to have validity. Check CompDrinks.no and http://www.lee.com/. Look at our local in-state institutions.  Diabetes - ok to only check CBGs when you feel bad. Will check A1C today for long term mangement  Will check cholesterol levels today.  Staff will help get you set up with an audiologist for formal hearing testing.   Best wishes to the family.

## 2011-08-09 ENCOUNTER — Encounter: Payer: Self-pay | Admitting: Internal Medicine

## 2011-08-09 NOTE — Progress Notes (Signed)
  Subjective:    Patient ID: [name], male    DOB: [birth year], 78 y.o.   MRN: [id number]  HPI [name] presents for routine follow-up. He  Has tried to be adherent to his medical regimen including diabetic diet. He continues to have back pain and has been reading about laser surgery for the treatment of spinal stenosis. In general though he is doing well.   PMH, FamHx and SocHx reviewed for any changes and relevance.    Review of Systems System review is negative for any constitutional, cardiac, pulmonary, GI or neuro symptoms or complaints other than as described in the HPI.     Objective:   Physical Exam Gen'l- WNWD overweight white man in no distress PUlm - normal respirations Cor- 2+ radial, RRR Neuro - A&O x 3, normal gait.      Assessment & Plan:

## 2011-08-09 NOTE — Assessment & Plan Note (Signed)
Discussed his spinal stenosis and laser surgery. Did a rapid literature search on Google - many articles about laser surgery for disc vaporization to relieve spinal stenosis; PubMed pulled a relatively recent review article for him. He is advised to not get involved in out of state providers offering free evaluation and review of MRI studies. He should work with Dr. Danielle Dess on this issue and if he wants to pursue this treatment option to look locally or at in-state centers such as [facility], [place], [place].  (greater than 50% of 40 minute visit spent on education and counseling)

## 2011-08-25 ENCOUNTER — Ambulatory Visit: Payer: Medicare Other | Attending: Internal Medicine | Admitting: Audiology

## 2011-08-25 DIAGNOSIS — H905 Unspecified sensorineural hearing loss: Secondary | ICD-10-CM | POA: Insufficient documentation

## 2011-09-03 ENCOUNTER — Encounter: Payer: Self-pay | Admitting: Internal Medicine

## 2011-09-14 ENCOUNTER — Other Ambulatory Visit: Payer: Self-pay | Admitting: *Deleted

## 2011-09-14 MED ORDER — FUROSEMIDE 40 MG PO TABS: 40.0000 mg | ORAL_TABLET | Freq: Every day | ORAL | Status: DC

## 2011-10-06 ENCOUNTER — Encounter: Payer: Self-pay | Admitting: Internal Medicine

## 2011-10-06 ENCOUNTER — Telehealth: Payer: Self-pay | Admitting: Internal Medicine

## 2011-10-06 ENCOUNTER — Ambulatory Visit (INDEPENDENT_AMBULATORY_CARE_PROVIDER_SITE_OTHER): Payer: Medicare Other | Admitting: Internal Medicine

## 2011-10-06 VITALS — BP 128/70 | HR 68 | Temp 97.7°F | Resp 16 | Ht 65.0 in | Wt 173.0 lb

## 2011-10-06 DIAGNOSIS — I10 Essential (primary) hypertension: Secondary | ICD-10-CM

## 2011-10-06 DIAGNOSIS — E785 Hyperlipidemia, unspecified: Secondary | ICD-10-CM

## 2011-10-06 DIAGNOSIS — M25569 Pain in unspecified knee: Secondary | ICD-10-CM

## 2011-10-06 DIAGNOSIS — M25561 Pain in right knee: Secondary | ICD-10-CM

## 2011-10-06 DIAGNOSIS — M25562 Pain in left knee: Secondary | ICD-10-CM

## 2011-10-06 DIAGNOSIS — M1990 Unspecified osteoarthritis, unspecified site: Secondary | ICD-10-CM

## 2011-10-06 DIAGNOSIS — E119 Type 2 diabetes mellitus without complications: Secondary | ICD-10-CM

## 2011-10-06 MED ORDER — HYDROCODONE-ACETAMINOPHEN 5-325 MG PO TABS: 1.0000 | ORAL_TABLET | Freq: Four times a day (QID) | ORAL | Status: AC | PRN

## 2011-10-06 NOTE — Telephone Encounter (Signed)
Ok to add on

## 2011-10-06 NOTE — Telephone Encounter (Signed)
The pt called and is hoping to get worked in for knee pain.  He was offered the first of July, but stated his pain was too intense to wait that long.  Do you want him added?   Thanks!   [phone]

## 2011-10-06 NOTE — Patient Instructions (Addendum)
For knee pain - ok to use Hydrocodone with tylenol 5/325 as needed. Be sure to not exceed 3,000 mg daily of tylenol.  Will work on referral to Dr. Madelon Lips (at Dr. Andreas Blower old office) for knee evaluation and synvisc or supartz injection (hyaluronidase - the natural lubricant of the knee) for relief of pain.

## 2011-10-07 NOTE — Progress Notes (Signed)
Subjective:    Patient ID: [name], male    DOB: Dec 29, 1931, 77 y.o.   MRN: [id number]  HPI Mr. Hausmann presents today with progress and limiting knee pain. He has DJD of both knees. He is asking for treatment. We discussed the short term nature of steroid injection treatment. He is otherwise doing ok.  Past Medical History  Diagnosis Date  . HERPES SIMPLEX INFECTION 01/19/2007  . TINEA PEDIS 11/15/2009  . DIABETES MELLITUS 01/19/2007  . HYPERLIPIDEMIA 01/19/2007  . DEPRESSION, CHRONIC 06/24/2007  . OBSTRUCTIVE SLEEP APNEA 01/19/2007  . Carpal tunnel syndrome 03/24/2007  . HYPERTENSION 01/19/2007  . Intermediate coronary syndrome 08/29/2008  . CORONARY ARTERY DISEASE 01/19/2007  . URI 01/11/2009  . OSTEOARTHRITIS 06/24/2007  . SHOULDER PAIN, LEFT 03/24/2007  . SPINAL STENOSIS, LUMBAR 06/03/2009  . BACK PAIN, CHRONIC 04/18/2009  . PLANTAR FASCIITIS, RIGHT 01/19/2007  . LEG CRAMPS, NOCTURNAL 10/28/2007  . HEART MURMUR, SYSTOLIC 01/02/2008  . Diarrhea 02/23/2007  . BENIGN PROSTATIC HYPERTROPHY, HX OF 01/19/2007  . PERCUTANEOUS TRANSLUMINAL CORONARY ANGIOPLASTY, HX OF 01/19/2007  . Other postprocedural status 01/19/2007  . DIABETES MELLITUS, TYPE II, WITH NEUROLOGICAL COMPLICATIONS 06/17/2010  . Leg cramps, sleep related 08/05/2010   Past Surgical History  Procedure Date  . Arthroscopy, knee of hx   . Plastic joint in thumb   . Inguinal herniorrhapy, hx   . Percutaneous transluminal coronary angioplasty, hx of 2004    Dr. Chales Abrahams  . Ptca/stent 2004   Family History  Problem Relation Age of Onset  . Diabetes Other   . Coronary artery disease Other    History   Social History  . Marital Status: Married    Spouse Name: N/A    Number of Children: N/A  . Years of Education: N/A   Occupational History  . 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 Smoking status: Former Smoker    Quit date: 04/14/1983  . Smokeless tobacco: Not on file   Comment: stopped  1985  . Alcohol Use: No  . Drug Use: No  . Sexually Active: Not on file   Other Topics Concern  . Not on file   Social History Narrative   Married in 1959 - 50yrs, divorced; married 1974 - 1.5 years, divorced; married 1985 1 daughter 72; 22 step daughters; 2 grandchildren.    Current Outpatient Prescriptions on File Prior to Visit  Medication Sig Dispense Refill  . acetaminophen (TYLENOL) 500 MG tablet Take 500 mg by mouth as directed.        Marland Kitchen aspirin 81 MG EC tablet Take 81 mg by mouth daily.       Marland Kitchen atorvastatin (LIPITOR) 80 MG tablet Take 1 tablet (80 mg total) by mouth daily.  90 tablet  3  . Eszopiclone (ESZOPICLONE) 3 MG TABS Take 1 tablet (3 mg total) by mouth at bedtime. Take immediately before bedtime  30 tablet  11  . finasteride (PROSCAR) 5 MG tablet Take 1 tablet (5 mg total) by mouth daily.  30 tablet  5  . furosemide (LASIX) 40 MG tablet Take 1 tablet (40 mg total) by mouth daily.  90 tablet  1  . gabapentin (NEURONTIN) 100 MG capsule Take 1 capsule (100 mg total) by mouth daily.  30 capsule  11  . glucose blood (FREESTYLE LITE) test strip Use as instructed daily to test blood glucose. Dx: 250.00  100 each  3  . linagliptin (TRADJENTA) 5 MG  TABS tablet Take 1 tablet (5 mg total) by mouth daily. For diabetes   30 tablet  11  . losartan (COZAAR) 100 MG tablet Take 1 tablet (100 mg total) by mouth daily.  30 tablet  11  . meloxicam (MOBIC) 15 MG tablet Take 1 tablet (15 mg total) by mouth daily.  30 tablet  11  . metoprolol tartrate (LOPRESSOR) 25 MG tablet Take 1 tablet (25 mg total) by mouth 2 (two) times daily.  60 tablet  5  . sitaGLIPtin (JANUVIA) 100 MG tablet Take 1 tablet (100 mg total) by mouth daily.  30 tablet  11  . Tamsulosin HCl (FLOMAX) 0.4 MG CAPS Take 1 capsule (0.4 mg total) by mouth daily.  90 capsule  3  . metFORMIN (GLUCOPHAGE) 1000 MG tablet Take 1 tablet (1,000 mg total) by mouth 2 (two) times daily with a meal.  180 tablet  3      Review of  Systems System review is negative for any constitutional, cardiac, pulmonary, GI or neuro symptoms or complaints other than as described in the HPI.     Objective:   Physical Exam Filed Vitals:   10/06/11 1527  BP: 128/70  Pulse: 68  Temp: 97.7 F (36.5 C)  Resp: 16   Gen'l- WNWD older white man in no distress Cor- RRR PUlm - normal respirations MSK- bowed knees, no effusion, + crepitus without instability - negative drawer sign. Ambulates w/o device       Assessment & Plan:

## 2011-10-07 NOTE — Assessment & Plan Note (Signed)
BP Readings from Last 3 Encounters:  10/06/11 128/70  03/19/11 142/88  02/16/11 132/72

## 2011-10-07 NOTE — Assessment & Plan Note (Signed)
Lab Results  Component Value Date   CHOL 108 08/06/2011   HDL 44.10 08/06/2011   LDLCALC 40 08/06/2011   LDLDIRECT 56.6 02/16/2011   TRIG 119.0 08/06/2011   CHOLHDL 2 08/06/2011

## 2011-10-07 NOTE — Assessment & Plan Note (Signed)
Probable advance DJD both knees with the patient having pain and limitation in activity. He has h/o arthroscopic surgery left knee (Dr. Chaney Malling). Discussed intermediate treatment options: steroid injection - too short a duration of effect; hyaluronidase (synvisc or Supartz) injection; TKR  Plan- will refer to Dr. Madelon Lips for consultation.

## 2011-10-07 NOTE — Assessment & Plan Note (Signed)
Lab Results  Component Value Date   HGBA1C 6.1 08/06/2011

## 2011-10-23 ENCOUNTER — Telehealth: Payer: Self-pay | Admitting: Internal Medicine

## 2011-10-23 MED ORDER — DULOXETINE HCL 30 MG PO CPEP: 30.0000 mg | ORAL_CAPSULE | Freq: Every day | ORAL | Status: DC

## 2011-10-23 NOTE — Telephone Encounter (Signed)
Spoke with Mrs. Toto. [name] is having a terrible time with leg cramps that are interfering with is ability to sleep. He is having increasing problems with memory, repetition and irritability - all of which may be exacerbated by lack of sleep.   Plan - will try him on cymbalta 30 mg once a day. Stop taking the neurontin. (samples provided).  Provided hand written instructions to mrs. Ensey to take to Mr. Desaulniers.

## 2011-11-06 ENCOUNTER — Other Ambulatory Visit: Payer: Self-pay | Admitting: Internal Medicine

## 2011-11-28 ENCOUNTER — Other Ambulatory Visit: Payer: Self-pay | Admitting: Internal Medicine

## 2012-01-19 ENCOUNTER — Telehealth: Payer: Self-pay | Admitting: Internal Medicine

## 2012-01-19 NOTE — Telephone Encounter (Signed)
Forward 2 pages from [facility] to [employer] for review on 01-19-12 ym

## 2012-01-20 ENCOUNTER — Other Ambulatory Visit: Payer: Self-pay | Admitting: Internal Medicine

## 2012-01-21 ENCOUNTER — Other Ambulatory Visit: Payer: Self-pay | Admitting: Internal Medicine

## 2012-01-29 ENCOUNTER — Other Ambulatory Visit: Payer: Self-pay | Admitting: Internal Medicine

## 2012-02-04 ENCOUNTER — Ambulatory Visit (INDEPENDENT_AMBULATORY_CARE_PROVIDER_SITE_OTHER): Payer: Medicare Other | Admitting: Internal Medicine

## 2012-02-04 ENCOUNTER — Encounter: Payer: Self-pay | Admitting: Internal Medicine

## 2012-02-04 VITALS — BP 158/84 | HR 62 | Temp 97.0°F | Resp 16 | Wt 174.0 lb

## 2012-02-04 DIAGNOSIS — I10 Essential (primary) hypertension: Secondary | ICD-10-CM

## 2012-02-04 DIAGNOSIS — E785 Hyperlipidemia, unspecified: Secondary | ICD-10-CM

## 2012-02-04 DIAGNOSIS — M48061 Spinal stenosis, lumbar region without neurogenic claudication: Secondary | ICD-10-CM

## 2012-02-04 DIAGNOSIS — E119 Type 2 diabetes mellitus without complications: Secondary | ICD-10-CM

## 2012-02-04 DIAGNOSIS — R252 Cramp and spasm: Secondary | ICD-10-CM

## 2012-02-04 MED ORDER — COLESEVELAM HCL 3.75 G PO PACK: 3.7500 g | PACK | Freq: Every day | ORAL | Status: DC

## 2012-02-04 NOTE — Patient Instructions (Addendum)
Cholesterol - don't want the cure to be worse than the disease.  Plan -  Welchol 3.75g powder - take once a day in juice  Follow-up lab in 4 weeks.  Diabetes - last A1C 6.1% - GREAT Plan - continue present medications  Back pain - only you know if the bang is worth the buck. I recommend you let Dr. Danielle Dess know that the last shot did not work as well.   Blood pressure - running a little high.  Plan Please check you blood pressure at home. If it running consistently in the high 140's and 150 we will need to address this.

## 2012-02-08 ENCOUNTER — Telehealth: Payer: Self-pay | Admitting: *Deleted

## 2012-02-08 NOTE — Assessment & Plan Note (Signed)
Continued back pain. He had in the past had good results with ESI but his last injection did not help much.  Plan Recommended he contact Dr. Danielle Dess about the failure of last ESI to discuss redo.

## 2012-02-08 NOTE — Assessment & Plan Note (Signed)
BP Readings from Last 3 Encounters:  02/04/12 158/84  10/06/11 128/70  03/19/11 142/88   Has had better control in the past.  Plan - Monitor BP at home and report back. For SBP consistently greater than 140 will adjust medications.

## 2012-02-08 NOTE — Telephone Encounter (Signed)
Message copied by Elnora Morrison on Mon Feb 08, 2012 10:41 AM ------      Message from: Jacques Navy      Created: Mon Feb 08, 2012  4:55 AM       Call and remind patient to come back for lab early December.Thanks

## 2012-02-08 NOTE — Assessment & Plan Note (Signed)
Had good results with atorvastatin but has stopped medication. Discussed the need for good control in the setting of known CAD.   Plan  Trial of Welchol once a day.  May need to add to this, e.g. Zetia.  Follow-up lipid panel in 4-6 weeks.

## 2012-02-08 NOTE — Assessment & Plan Note (Signed)
Lab Results  Component Value Date   HGBA1C 6.1 08/06/2011   Very good control. Will recheck A1C at next lab draw.

## 2012-02-08 NOTE — Telephone Encounter (Signed)
Left message on patient home voice mail #of need to return in early December, 2013 for lab work to be done.

## 2012-02-08 NOTE — Progress Notes (Signed)
Subjective:    Patient ID: [name], male    DOB: 1931/10/21, 78 y.o.   MRN: [id number]  HPI [name] presents for follow up of hyperlipidemia, BP and DM. He reports that after reading a column by [name] he stopped his statin medication which lead to an improvement in his sense of well-being and reduced depression. Reviewed his last A1C which was 6.21% - he is advised of great control. He reports his BP has been running a little high on occasion. Lastly, he continues to have back pain - his last ESI did not give him much relief.  Past Medical History  Diagnosis Date  . HERPES SIMPLEX INFECTION 01/19/2007  . TINEA PEDIS 11/15/2009  . DIABETES MELLITUS 01/19/2007  . HYPERLIPIDEMIA 01/19/2007  . DEPRESSION, CHRONIC 06/24/2007  . OBSTRUCTIVE SLEEP APNEA 01/19/2007  . Carpal tunnel syndrome 03/24/2007  . HYPERTENSION 01/19/2007  . Intermediate coronary syndrome 08/29/2008  . CORONARY ARTERY DISEASE 01/19/2007  . URI 01/11/2009  . OSTEOARTHRITIS 06/24/2007  . SHOULDER PAIN, LEFT 03/24/2007  . SPINAL STENOSIS, LUMBAR 06/03/2009  . BACK PAIN, CHRONIC 04/18/2009  . PLANTAR FASCIITIS, RIGHT 01/19/2007  . LEG CRAMPS, NOCTURNAL 10/28/2007  . HEART MURMUR, SYSTOLIC 01/02/2008  . Diarrhea 02/23/2007  . BENIGN PROSTATIC HYPERTROPHY, HX OF 01/19/2007  . PERCUTANEOUS TRANSLUMINAL CORONARY ANGIOPLASTY, HX OF 01/19/2007  . Other postprocedural status 01/19/2007  . DIABETES MELLITUS, TYPE II, WITH NEUROLOGICAL COMPLICATIONS 06/17/2010  . Leg cramps, sleep related 08/05/2010   Past Surgical History  Procedure Date  . Arthroscopy, knee of hx   . Plastic joint in thumb   . Inguinal herniorrhapy, hx   . Percutaneous transluminal coronary angioplasty, hx of 2004    Dr. Chales Abrahams  . Ptca/stent 2004   Family History  Problem Relation Age of Onset  . Diabetes Other   . Coronary artery disease Other    History   Social History  . Marital Status: Married    Spouse Name: N/A    Number of Children: N/A  . Years  of Education: N/A   Occupational History  . 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 Smoking status: Former Smoker    Quit date: 04/14/1983  . Smokeless tobacco: Not on file   Comment: stopped 1985  . Alcohol Use: No  . Drug Use: No  . Sexually Active: Not on file   Other Topics Concern  . Not on file   Social History Narrative   Married in 1959 - 75yrs, divorced; married 1974 - 1.5 years, divorced; married 1985 1 daughter 7; 37 step daughters; 2 grandchildren.    Current Outpatient Prescriptions on File Prior to Visit  Medication Sig Dispense Refill  . acetaminophen (TYLENOL) 500 MG tablet Take 500 mg by mouth as directed.        Marland Kitchen aspirin 81 MG EC tablet Take 81 mg by mouth daily.       . Eszopiclone (ESZOPICLONE) 3 MG TABS Take 1 tablet (3 mg total) by mouth at bedtime. Take immediately before bedtime  30 tablet  11  . finasteride (PROSCAR) 5 MG tablet TAKE 1 TABLET BY MOUTH EVERY DAY  30 tablet  5  . furosemide (LASIX) 40 MG tablet Take 1 tablet (40 mg total) by mouth daily.  90 tablet  1  . glucose blood (FREESTYLE LITE) test strip Use as instructed daily to test blood glucose. Dx: 250.00  100 each  3  . JANUVIA 100  MG tablet TAKE 1 TABLET BY MOUTH ONCE DAILY  30 tablet  11  . losartan (COZAAR) 100 MG tablet TAKE 1 TABLET EVERY DAY  30 tablet  11  . meloxicam (MOBIC) 15 MG tablet Take 1 tablet (15 mg total) by mouth daily.  30 tablet  11  . metFORMIN (GLUCOPHAGE) 1000 MG tablet TAKE 1 TABLET BY MOUTH TWICE A DAY WITH A MEAL  180 tablet  3  . metoprolol tartrate (LOPRESSOR) 25 MG tablet TAKE 1 TABLET TWICE A DAY  60 tablet  5  . Tamsulosin HCl (FLOMAX) 0.4 MG CAPS Take 1 capsule (0.4 mg total) by mouth daily.  90 capsule  3  . Colesevelam HCl (WELCHOL) 3.75 G PACK Take 3.75 g by mouth daily. Mix with juice  30 each  5  . DULoxetine (CYMBALTA) 30 MG capsule Take 1 capsule (30 mg total) by mouth daily. Samples provided.  30 capsule  11    . linagliptin (TRADJENTA) 5 MG TABS tablet Take 1 tablet (5 mg total) by mouth daily. For diabetes   30 tablet  11      Review of Systems System review is negative for any constitutional, cardiac, pulmonary, GI or neuro symptoms or complaints other than as described in the HPI.     Objective:   Physical Exam Filed Vitals:   02/04/12 1315  BP: 158/84  Pulse: 62  Temp: 97 F (36.1 C)  Resp: 16   Wt Readings from Last 3 Encounters:  02/04/12 174 lb (78.926 kg)  10/06/11 173 lb (78.472 kg)  03/19/11 176 lb 4 oz (79.946 kg)   Gen'l- WNWD white man in no distress HEENT C&S clear, PERRLA Cor- 2+ radial , RRR Pulm - normal respirations Neuro - A&O x 3, cognition is normal Psych - in good spirits.       Assessment & Plan:

## 2012-02-08 NOTE — Assessment & Plan Note (Signed)
He continues to have leg cramps and now is having cramps in the arms as well. Last K in April '13 was OK.  Plan - continue with quinine via tonic water, consider use of Ginko  May consider retrial of gabapentin if discomfort continues.

## 2012-02-18 ENCOUNTER — Ambulatory Visit (INDEPENDENT_AMBULATORY_CARE_PROVIDER_SITE_OTHER): Payer: Medicare Other | Admitting: Internal Medicine

## 2012-02-18 ENCOUNTER — Encounter: Payer: Self-pay | Admitting: Internal Medicine

## 2012-02-18 VITALS — BP 130/78 | HR 65 | Temp 97.4°F | Resp 16 | Wt 174.0 lb

## 2012-02-18 DIAGNOSIS — D485 Neoplasm of uncertain behavior of skin: Secondary | ICD-10-CM

## 2012-02-18 NOTE — Addendum Note (Signed)
Addended by: Elnora Morrison on: 02/18/2012 05:56 PM   Modules accepted: Orders

## 2012-02-18 NOTE — Progress Notes (Signed)
  Subjective:    Patient ID: [name], male    DOB: June 24, 1931, 78 y.o.   MRN: [id number]  HPI [name] presents for evaluation of a irritated mole on the right flank. This lesion has been growing and he has excoriated the lesion as well.  PMH, FamHx and SocHx reviewed for any changes and relevance.  Medications - reviewed, no change from last visit   Review of Systems System review is negative for any constitutional, cardiac, pulmonary, GI or neuro symptoms or complaints other than as described in the HPI.     Objective:   Physical Exam Filed Vitals:   02/18/12 1527  BP: 130/78  Pulse: 65  Temp: 97.4 F (36.3 C)  Resp: 16   Gen'l- WNWD white man in no distress Derm - on the right flank is a 2 cm lesion with heaped up edges and excoriated center. There is mild surrounding erythema. No frank drainage is appreciated.  Excisional biopsy: Indication - skin lesion of uncertain behavior - basal cell carcinoma Consent - signed consent obtained after explaining risks of bleeding and infection Anesthesia - 2% xylocaine with epi Prep - betadine Using a #10 scalpel a cm elliptical incision was made, encompassing the skin lesion, cutting through the dermis. Specimen submitted to pathology The wound was closed using 3-0 Ethilon with 5 interrupted sutures. Dressing - triple antibiotic ointment and large bandaide Wound care - wash daily with soap and water, apply ointment and cover with large bandaide. Routine precautions were given. Patient tolerated the procedure well. ROV 1 week for suture removal, sooner for any problems.        Assessment & Plan:  Skin lesion of uncertain behavior - most likely a basal cell carcinoma.

## 2012-02-22 ENCOUNTER — Emergency Department
Admission: EM | Admit: 2012-02-22 | Discharge: 2012-02-22 | Disposition: A | Discharge status: Home / Self Care | Payer: Medicare Other | Source: Home / Self Care

## 2012-02-22 ENCOUNTER — Encounter: Payer: Self-pay | Admitting: *Deleted

## 2012-02-22 DIAGNOSIS — IMO0002 Reserved for concepts with insufficient information to code with codable children: Secondary | ICD-10-CM

## 2012-02-22 DIAGNOSIS — Z5189 Encounter for other specified aftercare: Secondary | ICD-10-CM

## 2012-02-22 NOTE — ED Notes (Signed)
Patient c/o redness, itching on stitches he had done Thursday. It is oozing now states 2 hrs ago it was not like that.

## 2012-02-22 NOTE — ED Provider Notes (Signed)
History     CSN: [id number]  Arrival date & time 02/22/12  1820   None     Chief Complaint  Patient presents with  . Wound Check   HPI Pt is here for wound check.  Pt had mole removal last week (11/4) with stitches.  Pt removed bandage today and noticed some mild redness.  Pt called office about redness and pt was referred to UC as pt also has baseline DM.  Area has been mildly itchy.  No pain or purulent drainage. No fevers or chills.   Baseline DM have been relatively well controlled per pt.  CBGs in 110s-150s per pt.  Pt has follow up appt for suture removal 11/14).     Past Medical History  Diagnosis Date  . HERPES SIMPLEX INFECTION 01/19/2007  . TINEA PEDIS 11/15/2009  . DIABETES MELLITUS 01/19/2007  . HYPERLIPIDEMIA 01/19/2007  . DEPRESSION, CHRONIC 06/24/2007  . OBSTRUCTIVE SLEEP APNEA 01/19/2007  . Carpal tunnel syndrome 03/24/2007  . HYPERTENSION 01/19/2007  . Intermediate coronary syndrome 08/29/2008  . CORONARY ARTERY DISEASE 01/19/2007  . URI 01/11/2009  . OSTEOARTHRITIS 06/24/2007  . SHOULDER PAIN, LEFT 03/24/2007  . SPINAL STENOSIS, LUMBAR 06/03/2009  . BACK PAIN, CHRONIC 04/18/2009  . PLANTAR FASCIITIS, RIGHT 01/19/2007  . LEG CRAMPS, NOCTURNAL 10/28/2007  . HEART MURMUR, SYSTOLIC 01/02/2008  . Diarrhea 02/23/2007  . BENIGN PROSTATIC HYPERTROPHY, HX OF 01/19/2007  . PERCUTANEOUS TRANSLUMINAL CORONARY ANGIOPLASTY, HX OF 01/19/2007  . Other postprocedural status 01/19/2007  . DIABETES MELLITUS, TYPE II, WITH NEUROLOGICAL COMPLICATIONS 06/17/2010  . Leg cramps, sleep related 08/05/2010    Past Surgical History  Procedure Date  . Arthroscopy, knee of hx   . Plastic joint in thumb   . Inguinal herniorrhapy, hx   . Percutaneous transluminal coronary angioplasty, hx of 2004    Dr. Chales Abrahams  . Ptca/stent 2004    Family History  Problem Relation Age of Onset  . Diabetes Other   . Coronary artery disease Other     History  Substance Use Topics  . Smoking status:  Former Smoker    Quit date: 04/14/1983  . Smokeless tobacco: Not on file     Comment: stopped 1985  . Alcohol Use: No      Review of Systems  All other systems reviewed and are negative.    Allergies  Review of patient's allergies indicates no known allergies.  Home Medications   Current Outpatient Rx  Name  Route  Sig  Dispense  Refill  . ACETAMINOPHEN 500 MG PO TABS   Oral   Take 500 mg by mouth as directed.           . ASPIRIN 81 MG PO TBEC   Oral   Take 81 mg by mouth daily.          . COLESEVELAM HCL 3.75 G PO PACK   Oral   Take 3.75 g by mouth daily. Mix with juice   30 each   5   . DULOXETINE HCL 30 MG PO CPEP   Oral   Take 1 capsule (30 mg total) by mouth daily. Samples provided.   30 capsule   11   . ESZOPICLONE 3 MG PO TABS   Oral   Take 1 tablet (3 mg total) by mouth at bedtime. Take immediately before bedtime   30 tablet   11   . FINASTERIDE 5 MG PO TABS      TAKE 1 TABLET BY MOUTH EVERY DAY  30 tablet   5   . FUROSEMIDE 40 MG PO TABS   Oral   Take 1 tablet (40 mg total) by mouth daily.   90 tablet   1   . GLUCOSE BLOOD VI STRP      Use as instructed daily to test blood glucose. Dx: 250.00   100 each   3   . JANUVIA 100 MG PO TABS      TAKE 1 TABLET BY MOUTH ONCE DAILY   30 tablet   11   . LINAGLIPTIN 5 MG PO TABS   Oral   Take 1 tablet (5 mg total) by mouth daily. For diabetes    30 tablet   11   . LOSARTAN POTASSIUM 100 MG PO TABS      TAKE 1 TABLET EVERY DAY   30 tablet   11   . MELOXICAM 15 MG PO TABS   Oral   Take 1 tablet (15 mg total) by mouth daily.   30 tablet   11   . METFORMIN HCL 1000 MG PO TABS      TAKE 1 TABLET BY MOUTH TWICE A DAY WITH A MEAL   180 tablet   3   . METOPROLOL TARTRATE 25 MG PO TABS      TAKE 1 TABLET TWICE A DAY   60 tablet   5   . TAMSULOSIN HCL 0.4 MG PO CAPS   Oral   Take 1 capsule (0.4 mg total) by mouth daily.   90 capsule   3     BP 180/100  Pulse 75   Temp 97.5 F (36.4 C) (Oral)  Resp 16  Ht 5\' 5"  (1.651 m)  Wt 176 lb 4 oz (79.946 kg)  BMI 29.33 kg/m2  SpO2 96%  Physical Exam  Constitutional: He appears well-developed and well-nourished.  HENT:  Head: Normocephalic and atraumatic.  Eyes: Conjunctivae normal are normal. Pupils are equal, round, and reactive to light.  Neck: Normal range of motion. Neck supple.  Cardiovascular: Normal rate and regular rhythm.   Pulmonary/Chest: Effort normal and breath sounds normal.  Abdominal: Soft.  Musculoskeletal: Normal range of motion.  Neurological: He is alert.  Skin: Skin is warm.    ED Course  Procedures (including critical care time)  Labs Reviewed - No data to display No results found.   1. Visit for wound check       MDM  Overall area is well appearing with no purulent drainage, tenderness, or fluctuance.  Suspect this is likely secondary skin reaction to sutures which is normal.  Discussed with pt follow up with PCP in am for recheck and suture removal as well as infectious red flags for reevaluation.  Currently no indication for abx.      The patient and/or caregiver has been counseled thoroughly with regard to treatment plan and/or medications prescribed including dosage, schedule, interactions, rationale for use, and possible side effects and they verbalize understanding. Diagnoses and expected course of recovery discussed and will return if not improved as expected or if the condition worsens. Patient and/or caregiver verbalized understanding.              Doree Albee, MD 02/22/12 2016

## 2012-02-23 ENCOUNTER — Telehealth: Payer: Self-pay | Admitting: *Deleted

## 2012-02-25 ENCOUNTER — Other Ambulatory Visit: Payer: Self-pay | Admitting: Internal Medicine

## 2012-02-25 ENCOUNTER — Ambulatory Visit (INDEPENDENT_AMBULATORY_CARE_PROVIDER_SITE_OTHER): Payer: Medicare Other | Admitting: Internal Medicine

## 2012-02-25 ENCOUNTER — Encounter: Payer: Self-pay | Admitting: Internal Medicine

## 2012-02-25 VITALS — BP 118/76 | Temp 98.1°F

## 2012-02-25 DIAGNOSIS — T8149XA Infection following a procedure, other surgical site, initial encounter: Secondary | ICD-10-CM

## 2012-02-25 DIAGNOSIS — T8140XA Infection following a procedure, unspecified, initial encounter: Secondary | ICD-10-CM

## 2012-02-25 DIAGNOSIS — E785 Hyperlipidemia, unspecified: Secondary | ICD-10-CM

## 2012-02-25 MED ORDER — EZETIMIBE 10 MG PO TABS: 10.0000 mg | ORAL_TABLET | Freq: Every day | ORAL | Status: DC

## 2012-02-25 MED ORDER — SULFAMETHOXAZOLE-TRIMETHOPRIM 800-160 MG PO TABS: 1.0000 | ORAL_TABLET | Freq: Two times a day (BID) | ORAL | Status: DC

## 2012-02-25 MED ORDER — DOXYCYCLINE HYCLATE 100 MG PO TABS: 100.0000 mg | ORAL_TABLET | Freq: Two times a day (BID) | ORAL | Status: DC

## 2012-02-25 NOTE — Progress Notes (Addendum)
  Subjective:    Patient ID: [name], male    DOB: [birth year], 78 y.o.   MRN: [id number]  HPI    Review of Systems     Objective:   Physical Exam        Assessment & Plan:  Addendum - Doxycycline prescribed for wound infection was prohibitively expensive. Plan- Septra DS bid x 10 days.  ROV Monday or Tuesday - wound check, f/u U/A and Bmet, CBC, mood check

## 2012-02-25 NOTE — Assessment & Plan Note (Addendum)
Patient is intolerant of welchol - medication stopped.   Plan - continue lipitor 80  Add zetia 10 mg  Repeat lipid panel in 4 weeks

## 2012-02-25 NOTE — Patient Instructions (Addendum)
1. Wound infection - the removed lesion was a benign inflammed keratosis. The wound does appear infected. Plan  Wash daily with soap and water, dry , cover with bandaid  Take doxycyline 100 mg twice a day for 10 days  Return for wound recheck if this does not look really improved over the weekend.  2. Cholesterol - will stop welchol and start Zetia 10 mg daily  Follow-up lab in 4 weeks.

## 2012-02-25 NOTE — Progress Notes (Signed)
Subjective:    Patient ID: [name], male    DOB: [birth year], 78 y.o.   MRN: [id number]  HPI [name] was seen a week ago for a suspicious lesion on the right flank. An excisional biopsy was done with the wound being closed with 5 interrupted sutures. He developed erythema around the wound site on the 2nd day and then went to [facility] - note reviewed - where he was told the wound was OK. He returns today for suture removal. He has had some pain at the wound site, he has been feeling bad and his wife reports that his urine has been dark.  He has not had any fever, chills or any purulent drainage from the wound.   He was recently started on Welchol powder. He reports that this has caused stomach cramps and stool change - he stopped the medication.   PMH, FamHx and SocHx reviewed for any changes and relevance.  Current Outpatient Prescriptions on File Prior to Visit  Medication Sig Dispense Refill  . acetaminophen (TYLENOL) 500 MG tablet Take 500 mg by mouth as directed.        Marland Kitchen aspirin 81 MG EC tablet Take 81 mg by mouth daily.       . Colesevelam HCl (WELCHOL) 3.75 G PACK Take 3.75 g by mouth daily. Mix with juice  30 each  5  . Eszopiclone (ESZOPICLONE) 3 MG TABS Take 1 tablet (3 mg total) by mouth at bedtime. Take immediately before bedtime  30 tablet  11  . finasteride (PROSCAR) 5 MG tablet TAKE 1 TABLET BY MOUTH EVERY DAY  30 tablet  5  . furosemide (LASIX) 40 MG tablet Take 1 tablet (40 mg total) by mouth daily.  90 tablet  1  . glucose blood (FREESTYLE LITE) test strip Use as instructed daily to test blood glucose. Dx: 250.00  100 each  3  . JANUVIA 100 MG tablet TAKE 1 TABLET BY MOUTH ONCE DAILY  30 tablet  11  . losartan (COZAAR) 100 MG tablet TAKE 1 TABLET EVERY DAY  30 tablet  11  . meloxicam (MOBIC) 15 MG tablet Take 1 tablet (15 mg total) by mouth daily.  30 tablet  11  . metFORMIN (GLUCOPHAGE) 1000 MG tablet TAKE 1 TABLET BY MOUTH TWICE A DAY WITH A MEAL  180  tablet  3  . metoprolol tartrate (LOPRESSOR) 25 MG tablet TAKE 1 TABLET TWICE A DAY  60 tablet  5  . Tamsulosin HCl (FLOMAX) 0.4 MG CAPS Take 1 capsule (0.4 mg total) by mouth daily.  90 capsule  3  . DULoxetine (CYMBALTA) 30 MG capsule Take 1 capsule (30 mg total) by mouth daily. Samples provided.  30 capsule  11  . linagliptin (TRADJENTA) 5 MG TABS tablet Take 1 tablet (5 mg total) by mouth daily. For diabetes   30 tablet  11       Review of Systems System review is negative for any constitutional, cardiac, pulmonary, GI or neuro symptoms or complaints other than as described in the HPI.     Objective:   Physical Exam Filed Vitals:   02/25/12 1410  BP: 118/76  Temp: 98.1 F (36.7 C)   Gen'l -overweight white man Derm - at the wound site there is erythema and tenderness. There was some scabbing over the wound. Sutures were removed. There is incomplete wound closure, there was creamy exudate at the site of needle entry and erythema. No purulent exudate expressed from  the wound.       Assessment & Plan:  Wound site infection - there is redness, tenderness and scant purulent material at the wound/suture site. He is feeling ill.  Plan Doxycycline 100 mg bid  Wash the wound with soap and water and dry, apply bandaide  Return Monday if the wound does show improvement

## 2012-02-29 ENCOUNTER — Encounter: Payer: Self-pay | Admitting: Internal Medicine

## 2012-02-29 ENCOUNTER — Ambulatory Visit (INDEPENDENT_AMBULATORY_CARE_PROVIDER_SITE_OTHER): Payer: Medicare Other | Admitting: Internal Medicine

## 2012-02-29 VITALS — BP 110/78 | HR 102 | Temp 97.3°F | Resp 12 | Wt 171.1 lb

## 2012-02-29 DIAGNOSIS — Z7189 Other specified counseling: Secondary | ICD-10-CM

## 2012-02-29 NOTE — Patient Instructions (Addendum)
Wound - does not look worse and maybe a little better. Plan - Wash with soap and water, using a washcloth, not a brillo pad, rinse dry and apply a 4x4 using paper tape.  Advanced care planning  Plan Go to www; [employer].org for great information on advanced care planning  Google up [facility]ST home page for additional information about advanced care planning  Review the sample MOST form  Life is usually worth living as long as you can know that you have some control over your care and that you can be assured that futile or heroic measures are avoided.

## 2012-03-02 DIAGNOSIS — Z01810 Encounter for preprocedural cardiovascular examination: Secondary | ICD-10-CM | POA: Insufficient documentation

## 2012-03-02 DIAGNOSIS — Z7189 Other specified counseling: Secondary | ICD-10-CM | POA: Insufficient documentation

## 2012-03-02 NOTE — Progress Notes (Signed)
Subjective:    Patient ID: [name], male    DOB: 1932-01-04, 78 y.o.   MRN: [id number]  HPI Follow up for infected excisional site right flank following excision biopsy of skin lesion - inflammed keratosis. He reports that he was feeling quite sick after last OV but has continued on antibiotics, Septra, and is feeling better.  Discussed mood and state of mind. He has some irritability, anhedonia, social withdrawal but denies tearfullness, feelings of being helpless/hopeless, thoughts of suicide. He does have existential ennui - aging is difficult for him.   PMH, FamHx and SocHx reviewed for any changes and relevance.  Current Outpatient Prescriptions on File Prior to Visit  Medication Sig Dispense Refill  . acetaminophen (TYLENOL) 500 MG tablet Take 500 mg by mouth as directed.        Marland Kitchen aspirin 81 MG EC tablet Take 81 mg by mouth daily.       Marland Kitchen atorvastatin (LIPITOR) 80 MG tablet       . doxycycline (VIBRA-TABS) 100 MG tablet Take 1 tablet (100 mg total) by mouth 2 (two) times daily.  20 tablet  0  . DULoxetine (CYMBALTA) 30 MG capsule Take 1 capsule (30 mg total) by mouth daily. Samples provided.  30 capsule  11  . Eszopiclone (ESZOPICLONE) 3 MG TABS Take 1 tablet (3 mg total) by mouth at bedtime. Take immediately before bedtime  30 tablet  11  . ezetimibe (ZETIA) 10 MG tablet Take 1 tablet (10 mg total) by mouth daily.  30 tablet  3  . finasteride (PROSCAR) 5 MG tablet TAKE 1 TABLET BY MOUTH EVERY DAY  30 tablet  5  . furosemide (LASIX) 40 MG tablet Take 1 tablet (40 mg total) by mouth daily.  90 tablet  1  . Glucose Blood (FREESTYLE LITE TEST VI)       . glucose blood (FREESTYLE LITE) test strip Use as instructed daily to test blood glucose. Dx: 250.00  100 each  3  . HYDROcodone-acetaminophen (NORCO/VICODIN) 5-325 MG per tablet       . JANUVIA 100 MG tablet TAKE 1 TABLET BY MOUTH ONCE DAILY  30 tablet  11  . linagliptin (TRADJENTA) 5 MG TABS tablet Take 1 tablet (5 mg total) by  mouth daily. For diabetes   30 tablet  11  . losartan (COZAAR) 100 MG tablet TAKE 1 TABLET EVERY DAY  30 tablet  11  . meloxicam (MOBIC) 15 MG tablet Take 1 tablet (15 mg total) by mouth daily.  30 tablet  11  . metFORMIN (GLUCOPHAGE) 1000 MG tablet TAKE 1 TABLET BY MOUTH TWICE A DAY WITH A MEAL  180 tablet  3  . metoprolol tartrate (LOPRESSOR) 25 MG tablet TAKE 1 TABLET TWICE A DAY  60 tablet  5  . sulfamethoxazole-trimethoprim (BACTRIM DS,SEPTRA DS) 800-160 MG per tablet Take 1 tablet by mouth 2 (two) times daily.  20 tablet  0  . Tamsulosin HCl (FLOMAX) 0.4 MG CAPS Take 1 capsule (0.4 mg total) by mouth daily.  90 capsule  3  . ZOSTAVAX 78295 UNT/0.65ML injection          Review of Systems System review is negative for any constitutional, cardiac, pulmonary, GI or neuro symptoms or complaints other than as described in the HPI.     Objective:   Physical Exam Filed Vitals:   02/29/12 1601  BP: 110/78  Pulse: 102  Temp: 97.3 F (36.3 C)  Resp: 12   Gen'l- overweight white  man in no distress Cor- RRR Pul - normal respirations Derm- incision site right flank with erythema, scant exudate, decreased tenderness, no fluctuance.       Assessment & Plan:  Infected wound - appears to be doing a little better  Plan Continue septra  Wound - does not look worse and maybe a little better.  Wash with soap and water, using a washcloth, not a brillo pad, rinse dry and apply a 4x4 using paper tape.

## 2012-03-14 ENCOUNTER — Other Ambulatory Visit (INDEPENDENT_AMBULATORY_CARE_PROVIDER_SITE_OTHER): Payer: Medicare Other

## 2012-03-14 DIAGNOSIS — E119 Type 2 diabetes mellitus without complications: Secondary | ICD-10-CM

## 2012-03-14 DIAGNOSIS — E785 Hyperlipidemia, unspecified: Secondary | ICD-10-CM

## 2012-03-14 LAB — HEMOGLOBIN A1C: Hgb A1c MFr Bld: 6.4 % (ref 4.6–6.5)

## 2012-03-14 LAB — LIPID PANEL: HDL: 45.2 mg/dL (ref >39.00)

## 2012-03-18 ENCOUNTER — Other Ambulatory Visit: Payer: Self-pay | Admitting: Internal Medicine

## 2012-04-21 ENCOUNTER — Other Ambulatory Visit: Payer: Self-pay | Admitting: Internal Medicine

## 2012-05-05 ENCOUNTER — Other Ambulatory Visit: Payer: Self-pay | Admitting: *Deleted

## 2012-05-05 MED ORDER — HYDROCODONE-ACETAMINOPHEN 5-325 MG PO TABS: 1.0000 | ORAL_TABLET | Freq: Four times a day (QID) | ORAL | Status: DC | PRN

## 2012-05-19 ENCOUNTER — Other Ambulatory Visit: Payer: Self-pay | Admitting: Internal Medicine

## 2012-06-24 ENCOUNTER — Telehealth: Payer: Self-pay

## 2012-06-24 NOTE — Telephone Encounter (Signed)
Done, order faxed.

## 2012-06-24 NOTE — Telephone Encounter (Signed)
[id number] with [facility] called stating an incomplete fax was sent on the pt. [name] signed it but did not indicate theraputic recommendation on a medication. Will have him mark appropriate response and fax it back to [phone].

## 2012-06-27 ENCOUNTER — Ambulatory Visit: Payer: Medicare Other | Admitting: Internal Medicine

## 2012-06-28 ENCOUNTER — Encounter: Payer: Self-pay | Admitting: Internal Medicine

## 2012-06-28 ENCOUNTER — Ambulatory Visit (INDEPENDENT_AMBULATORY_CARE_PROVIDER_SITE_OTHER): Payer: Medicare Other | Admitting: Internal Medicine

## 2012-06-28 VITALS — BP 180/98 | HR 53 | Temp 96.8°F | Resp 12 | Ht 65.0 in | Wt 176.0 lb

## 2012-06-28 DIAGNOSIS — M1990 Unspecified osteoarthritis, unspecified site: Secondary | ICD-10-CM

## 2012-06-28 DIAGNOSIS — E119 Type 2 diabetes mellitus without complications: Secondary | ICD-10-CM

## 2012-06-28 DIAGNOSIS — I10 Essential (primary) hypertension: Secondary | ICD-10-CM

## 2012-06-28 NOTE — Assessment & Plan Note (Signed)
Assessment: BP today 180/98 repeat after pt rested for >5 min was 170/90 no difference between arms. Pulse 53.  Pt's BP has been variable over the years.  Currently taking losartan, furosemide, and metoprolol. Plan: - Check BP no more than 3x's a day and keep record on MyChart - if consistently high will add a fourth agent with a different mechanism of action than currently prescribed.

## 2012-06-28 NOTE — Assessment & Plan Note (Signed)
Assessment: Well controlled last A1c 6.4 Plan: - Continue current therapy

## 2012-06-28 NOTE — Patient Instructions (Addendum)
1. Knee pain - you are a candidate for surgery if you wish to have it. If you go that way then we will need to get cardiac clearance.   For pain management - it is better to take medication on schedule to stay ahead of the pain rather than on an as needed basis where you need relief  Plan Consider knee surgery vs reduced activity. Just remember the rehab is tough but the end results are usually very good  Take the meloxicam daily  Take the vicodin on a regular schedule either two or three times a day  2. Blood pressure  -  Variable over the years. PB is driven by pain  Plan Keep track of BP at different times of day and report back.  3. Dm - continue the good work of continuing to control you sugars.

## 2012-06-28 NOTE — Assessment & Plan Note (Addendum)
Assessment: pt has worsening knee pain consistent with osteoarthritis.  He is currently taking pain medications prn with little relief. Discussed the current state of technology re: knee replacement including surgical techniques, improved prosthetic materials. Reviewed the nature and duration of rehab along with the high rate of success at the end of the whole process in regard to relief of pain and improved function. Advised that we don't age discriminate.  Plan: - knee replacement vs. Decreased activity with scheduled pain medication. Knee replacement surgery is still and option regardless of age, if surgery is pursued pt will need a cardiac work up - Better pain management taking medication on a scheduled basis rather than prn: meloxicam 1x daily, vicodin 3x's daily; avoid additional acetaminophen

## 2012-06-28 NOTE — Progress Notes (Signed)
Subjective:     Patient ID: [name], male   DOB: 06-20-1931, 79 y.o.   MRN: [id number]  HPI [name] is a 79 yo man that presents for follow up on his chronic knee pain secondary to osteoarthritis.  Over the last 10 days he has noticed increased pain particularly in his right knee.  He had seriously been contemplating knee replacement surgery but lately has been second guessing the decision and has considered living with the pain and decreasing his activity level.  He would like some insight into the recovery from surgery and the risks.  Past Medical History  Diagnosis Date  . HERPES SIMPLEX INFECTION 01/19/2007  . TINEA PEDIS 11/15/2009  . DIABETES MELLITUS 01/19/2007  . HYPERLIPIDEMIA 01/19/2007  . DEPRESSION, CHRONIC 06/24/2007  . OBSTRUCTIVE SLEEP APNEA 01/19/2007  . Carpal tunnel syndrome 03/24/2007  . HYPERTENSION 01/19/2007  . Intermediate coronary syndrome 08/29/2008  . CORONARY ARTERY DISEASE 01/19/2007  . URI 01/11/2009  . OSTEOARTHRITIS 06/24/2007  . SHOULDER PAIN, LEFT 03/24/2007  . SPINAL STENOSIS, LUMBAR 06/03/2009  . BACK PAIN, CHRONIC 04/18/2009  . PLANTAR FASCIITIS, RIGHT 01/19/2007  . LEG CRAMPS, NOCTURNAL 10/28/2007  . HEART MURMUR, SYSTOLIC 01/02/2008  . Diarrhea 02/23/2007  . BENIGN PROSTATIC HYPERTROPHY, HX OF 01/19/2007  . PERCUTANEOUS TRANSLUMINAL CORONARY ANGIOPLASTY, HX OF 01/19/2007  . Other postprocedural status 01/19/2007  . DIABETES MELLITUS, TYPE II, WITH NEUROLOGICAL COMPLICATIONS 06/17/2010  . Leg cramps, sleep related 08/05/2010   Past Surgical History  Procedure Laterality Date  . Arthroscopy, knee of hx    . Plastic joint in thumb    . Inguinal herniorrhapy, hx    . Percutaneous transluminal coronary angioplasty, hx of  2004    Dr. Chales Abrahams  . Ptca/stent  2004   Family History  Problem Relation Age of Onset  . Diabetes Other   . Coronary artery disease Other    History   Social History  . Marital Status: Married    Spouse Name: N/A    Number of  Children: N/A  . Years of Education: N/A   Occupational History  . 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 Smoking status: Former Smoker    Quit date: 04/14/1983  . Smokeless tobacco: Not on file     Comment: stopped 1985  . Alcohol Use: No  . Drug Use: No  . Sexually Active: Not on file   Other Topics Concern  . Not on file   Social History Narrative   Married in 1959 - 86yrs, divorced; married 1974 - 1.5 years, divorced; married 1985 1 daughter 50; 52 step daughters; 2 grandchildren.    Current Outpatient Prescriptions on File Prior to Visit  Medication Sig Dispense Refill  . acetaminophen (TYLENOL) 500 MG tablet Take 500 mg by mouth as directed.        Marland Kitchen aspirin 81 MG EC tablet Take 81 mg by mouth daily.       Marland Kitchen atorvastatin (LIPITOR) 80 MG tablet       . Eszopiclone (ESZOPICLONE) 3 MG TABS Take 1 tablet (3 mg total) by mouth at bedtime. Take immediately before bedtime  30 tablet  11  . finasteride (PROSCAR) 5 MG tablet TAKE 1 TABLET BY MOUTH EVERY DAY  30 tablet  5  . furosemide (LASIX) 40 MG tablet TAKE 1 TABLET (40 MG TOTAL) BY MOUTH DAILY.  90 tablet  1  . Glucose Blood (FREESTYLE LITE TEST VI)       .  glucose blood (FREESTYLE LITE) test strip Use as instructed daily to test blood glucose. Dx: 250.00  100 each  3  . HYDROcodone-acetaminophen (NORCO/VICODIN) 5-325 MG per tablet Take 1 tablet by mouth every 6 (six) hours as needed for pain.  30 tablet  3  . JANUVIA 100 MG tablet TAKE 1 TABLET BY MOUTH ONCE DAILY  30 tablet  11  . losartan (COZAAR) 100 MG tablet TAKE 1 TABLET EVERY DAY  30 tablet  11  . meloxicam (MOBIC) 15 MG tablet TAKE 1 TABLET (15 MG TOTAL) BY MOUTH DAILY.  30 tablet  11  . metFORMIN (GLUCOPHAGE) 1000 MG tablet TAKE 1 TABLET BY MOUTH TWICE A DAY WITH A MEAL  180 tablet  3  . metoprolol tartrate (LOPRESSOR) 25 MG tablet TAKE 1 TABLET TWICE A DAY  60 tablet  5  . sulfamethoxazole-trimethoprim (BACTRIM DS,SEPTRA DS)  800-160 MG per tablet Take 1 tablet by mouth 2 (two) times daily.  20 tablet  0  . Tamsulosin HCl (FLOMAX) 0.4 MG CAPS TAKE 1 CAPSULE BY MOUTH DAILY  90 capsule  3  . ZOSTAVAX 40981 UNT/0.65ML injection        No current facility-administered medications on file prior to visit.   Review of Systems  Constitutional: Negative for fever and unexpected weight change.  HENT: Positive for hearing loss.   Respiratory: Negative for shortness of breath.   Cardiovascular: Positive for leg swelling. Negative for chest pain.  Musculoskeletal: Positive for arthralgias (bilateral knee pain right worse than left). Negative for joint swelling.      Objective:   Physical Exam  Constitutional: He appears well-developed and well-nourished. No distress.  HENT:  Head: Normocephalic and atraumatic.  Eyes: EOM are normal. No scleral icterus.  Neck: No JVD present.  Cardiovascular: Normal rate, regular rhythm and intact distal pulses.   Murmur (II/VI systolic LL sternal border) heard. Pulmonary/Chest: Effort normal and breath sounds normal. He has no rales.  Abdominal: Soft. Bowel sounds are normal. He exhibits no distension.  obese  Musculoskeletal: He exhibits edema (1+ pitting edema bilaterally).  Significant pain with ambulation, pt had to step up onto exam table with left leg secondary to pain in the right leg.  No effusion, erythema, or warmth apparent in either knee  Neurological: He is alert.  Skin: Skin is warm and dry.  Psychiatric: He has a normal mood and affect.   Filed Vitals:   06/28/12 1138  BP: 180/98  Pulse: 53  Temp: 96.8  Resp: 12  SpO2 98     Assessment:   [name] is interviewed and examined personally. I agree with the assessment and plan of [name], MS III

## 2012-06-30 ENCOUNTER — Encounter: Payer: Self-pay | Admitting: Internal Medicine

## 2012-06-30 ENCOUNTER — Other Ambulatory Visit: Payer: Self-pay

## 2012-06-30 MED ORDER — HYDROCODONE-ACETAMINOPHEN 5-325 MG PO TABS: 1.0000 | ORAL_TABLET | Freq: Four times a day (QID) | ORAL | Status: DC | PRN

## 2012-06-30 NOTE — Telephone Encounter (Signed)
Hydrocodone called to [facility]

## 2012-07-04 ENCOUNTER — Encounter: Payer: Self-pay | Admitting: Internal Medicine

## 2012-07-04 ENCOUNTER — Other Ambulatory Visit: Payer: Self-pay | Admitting: *Deleted

## 2012-07-04 NOTE — Telephone Encounter (Signed)
Request already done by other means [Rx printed 03.20.14 #30x3]/SLS

## 2012-07-06 MED ORDER — ALISKIREN-VALSARTAN 150-160 MG PO TABS: 1.0000 | ORAL_TABLET | Freq: Every day | ORAL | Status: DC

## 2012-07-08 ENCOUNTER — Telehealth: Payer: Self-pay

## 2012-07-08 MED ORDER — LOSARTAN POTASSIUM 100 MG PO TABS: 100.0000 mg | ORAL_TABLET | Freq: Every day | ORAL | Status: DC

## 2012-07-08 MED ORDER — FINASTERIDE 5 MG PO TABS: 5.0000 mg | ORAL_TABLET | Freq: Every day | ORAL | Status: DC

## 2012-07-08 MED ORDER — METOPROLOL TARTRATE 25 MG PO TABS: 25.0000 mg | ORAL_TABLET | Freq: Two times a day (BID) | ORAL | Status: DC

## 2012-07-08 NOTE — Telephone Encounter (Signed)
I think he is reliable - it is ok for the 90 day supply

## 2012-07-08 NOTE — Telephone Encounter (Signed)
1. OK for losartan 100 mg - was going to change but will continue with this: sig 1 po qd, #90 3 refills  2. Ok for 90 day supply hydorcodone

## 2012-07-08 NOTE — Telephone Encounter (Signed)
Pt requesting a refill on Losartan potassium 100mg  but I do not see this on his list or in histories. Is this okay? Also requesting a 90 day supply on Hydrocodone Please advise.  Metoprolol and Finasteride electronically sent to [facility] per pt request

## 2012-07-08 NOTE — Telephone Encounter (Signed)
Will route meds

## 2012-07-08 NOTE — Telephone Encounter (Signed)
Okay to refill his Hydrocodone for 90 day supply?

## 2012-07-08 NOTE — Telephone Encounter (Signed)
Losartan sent to [facility]   A 90 day supply of Hydrocodone is 270 tablets since he is taking tid. Are you sure you want to prescribe this many for the pt? Please advise.

## 2012-07-12 NOTE — Telephone Encounter (Signed)
As of right now from pt's message in [facility] he does not want a 90 day supply hydrocodone

## 2012-07-12 NOTE — Telephone Encounter (Signed)
[name] did okay 90 day supply of Hydrocodone. So you do not want any Hydrocodone?? Please let me know.

## 2012-07-15 ENCOUNTER — Other Ambulatory Visit: Payer: Self-pay

## 2012-07-15 ENCOUNTER — Encounter: Payer: Self-pay | Admitting: Internal Medicine

## 2012-07-15 MED ORDER — METFORMIN HCL 1000 MG PO TABS: 1000.0000 mg | ORAL_TABLET | Freq: Two times a day (BID) | ORAL | Status: DC

## 2012-07-15 MED ORDER — FUROSEMIDE 40 MG PO TABS: 40.0000 mg | ORAL_TABLET | Freq: Every day | ORAL | Status: DC

## 2012-07-15 MED ORDER — METOPROLOL TARTRATE 25 MG PO TABS: 25.0000 mg | ORAL_TABLET | Freq: Two times a day (BID) | ORAL | Status: DC

## 2012-07-15 MED ORDER — FINASTERIDE 5 MG PO TABS: 5.0000 mg | ORAL_TABLET | Freq: Every day | ORAL | Status: DC

## 2012-07-15 NOTE — Telephone Encounter (Signed)
Re-sent prescription for Finasteride 5 mg and Metoprolol 25 mg since pt states [facility] did not received them on 07/08/12

## 2012-07-19 ENCOUNTER — Encounter: Payer: Self-pay | Admitting: Internal Medicine

## 2012-07-19 MED ORDER — HYDROCODONE-ACETAMINOPHEN 5-325 MG PO TABS: 1.0000 | ORAL_TABLET | Freq: Four times a day (QID) | ORAL | Status: DC | PRN

## 2012-07-19 NOTE — Telephone Encounter (Signed)
Hydrocodone called to [facility]

## 2012-08-02 ENCOUNTER — Other Ambulatory Visit: Payer: Self-pay | Admitting: Internal Medicine

## 2012-08-02 ENCOUNTER — Encounter: Payer: Self-pay | Admitting: Internal Medicine

## 2012-08-02 MED ORDER — VALSARTAN 160 MG PO TABS: 160.0000 mg | ORAL_TABLET | Freq: Every day | ORAL | Status: DC

## 2012-08-02 NOTE — Telephone Encounter (Signed)
Losartan changed to diovan 160   I did not change to the aliskeran/diovan comb as I am not comfortable with that medication

## 2012-08-09 ENCOUNTER — Encounter: Payer: Self-pay | Admitting: Internal Medicine

## 2012-08-09 ENCOUNTER — Telehealth: Payer: Self-pay | Admitting: *Deleted

## 2012-08-09 MED ORDER — MELOXICAM 15 MG PO TABS: 15.0000 mg | ORAL_TABLET | Freq: Every day | ORAL | Status: DC

## 2012-08-09 NOTE — Telephone Encounter (Signed)
Left msg on triage stating pt is a part of our heart failure program. Requesting copy of echo cardiogram. Can fax to [phone].../lmb   Faxed report of 07/05/07...lmb

## 2012-08-09 NOTE — Telephone Encounter (Signed)
Per pt request a 90 day supply of meloxicam has been sent to [facility]

## 2012-08-22 ENCOUNTER — Other Ambulatory Visit: Payer: Self-pay | Admitting: Internal Medicine

## 2012-08-23 ENCOUNTER — Other Ambulatory Visit: Payer: Self-pay

## 2012-08-23 MED ORDER — MELOXICAM 15 MG PO TABS: 15.0000 mg | ORAL_TABLET | Freq: Every day | ORAL | Status: DC

## 2012-08-25 ENCOUNTER — Other Ambulatory Visit: Payer: Self-pay

## 2012-08-25 NOTE — Telephone Encounter (Signed)
Opened in error

## 2012-08-26 ENCOUNTER — Other Ambulatory Visit: Payer: Self-pay

## 2012-08-26 MED ORDER — VALSARTAN 160 MG PO TABS: 160.0000 mg | ORAL_TABLET | Freq: Every day | ORAL | Status: DC

## 2012-09-06 ENCOUNTER — Encounter: Payer: Self-pay | Admitting: Internal Medicine

## 2012-09-06 ENCOUNTER — Other Ambulatory Visit: Payer: Self-pay | Admitting: Internal Medicine

## 2012-09-07 MED ORDER — VALSARTAN-HYDROCHLOROTHIAZIDE 160-12.5 MG PO TABS: 1.0000 | ORAL_TABLET | Freq: Every day | ORAL | Status: DC

## 2012-09-14 ENCOUNTER — Ambulatory Visit: Payer: Medicare Other | Admitting: Internal Medicine

## 2012-09-15 ENCOUNTER — Other Ambulatory Visit: Payer: Self-pay

## 2012-09-15 MED ORDER — TAMSULOSIN HCL 0.4 MG PO CAPS: 0.4000 mg | ORAL_CAPSULE | Freq: Every day | ORAL | Status: DC

## 2012-09-15 MED ORDER — ATORVASTATIN CALCIUM 80 MG PO TABS: 80.0000 mg | ORAL_TABLET | Freq: Every day | ORAL | Status: DC

## 2012-09-21 ENCOUNTER — Encounter: Payer: Self-pay | Admitting: Internal Medicine

## 2012-09-22 ENCOUNTER — Ambulatory Visit (INDEPENDENT_AMBULATORY_CARE_PROVIDER_SITE_OTHER): Payer: Medicare Other | Admitting: Internal Medicine

## 2012-09-22 ENCOUNTER — Encounter: Payer: Self-pay | Admitting: Internal Medicine

## 2012-09-22 VITALS — BP 128/78 | HR 55 | Temp 97.3°F | Ht 65.0 in | Wt 172.8 lb

## 2012-09-22 DIAGNOSIS — E785 Hyperlipidemia, unspecified: Secondary | ICD-10-CM

## 2012-09-22 DIAGNOSIS — N401 Enlarged prostate with lower urinary tract symptoms: Secondary | ICD-10-CM

## 2012-09-22 DIAGNOSIS — E119 Type 2 diabetes mellitus without complications: Secondary | ICD-10-CM

## 2012-09-22 DIAGNOSIS — N138 Other obstructive and reflux uropathy: Secondary | ICD-10-CM

## 2012-09-22 DIAGNOSIS — R252 Cramp and spasm: Secondary | ICD-10-CM

## 2012-09-22 DIAGNOSIS — N139 Obstructive and reflux uropathy, unspecified: Secondary | ICD-10-CM

## 2012-09-22 DIAGNOSIS — I10 Essential (primary) hypertension: Secondary | ICD-10-CM

## 2012-09-22 DIAGNOSIS — M48061 Spinal stenosis, lumbar region without neurogenic claudication: Secondary | ICD-10-CM

## 2012-09-22 MED ORDER — VALSARTAN-HYDROCHLOROTHIAZIDE 160-12.5 MG PO TABS: 1.0000 | ORAL_TABLET | Freq: Every day | ORAL | Status: DC

## 2012-09-22 NOTE — Patient Instructions (Addendum)
Incontinence - of urine: could be BPH with bladder outlet obstruction no adequately controlled the Proscar and Flomax but it may be spinal stenosis. Fecal incontinence may be GI related but could also be related to advanced spinal stenosis  Plan Continue present meds  Have a toileting schedule for BMs so that you can avoid urgency  See Dr. Danielle Dess for a reevaluation of the spinal stenosis and the role it may play in these problems ` OK to continue bulk laxative and the use of immodium  Cramps - sodium, potassium and calcium have been normal in the past. We will recheck. There is good circulation on physical exam. Unlikely that spinal stenosis is the cause.  Plan Cinchona products are a way to take quinine, which is the most effective drug to take for cramps. You can research products on-line.  Lab orders are entered which you can have drawn at your convenience - fasting will be best.   Muscle Cramps Muscle cramps are due to sudden involuntary muscle contraction. This means you have no control over the tightening of a muscle (or muscles). Often there are no obvious causes. Muscle cramps may occur with overexertion. They may also occur with chilling of the muscles. An example of a muscle chilling activity is swimming. It is uncommon for cramps to be due to a serious underlying disorder. In most cases, muscle cramps improve (or leave) within minutes. CAUSES  Some common causes are:  Injury.  Infections, especially viral.  Abnormal levels of the salts and ions in your blood (electrolytes). This could happen if you are taking water pills (diuretics).  Blood vessel disease where not enough blood is getting to the muscles (intermittent claudication). Some uncommon causes are:  Side effects of some medicine (such as lithium).  Alcohol abuse.  Diseases where there is soreness (inflammation) of the muscular system. HOME CARE INSTRUCTIONS   It may be helpful to massage, stretch, and relax the  affected muscle.  Taking a dose of over-the-counter diphenhydramine is helpful for night leg cramps. SEEK MEDICAL CARE IF:  Cramps are frequent and not relieved with medicine. MAKE SURE YOU:   Understand these instructions.  Will watch your condition.  Will get help right away if you are not doing well or get worse. Document Released: 09/19/2001 Document Revised: 06/22/2011 Document Reviewed: 03/21/2008 [facility] Patient Information 2014 [place], [place].

## 2012-09-25 NOTE — Assessment & Plan Note (Signed)
Urinary frequency and incontinence due to BPH vs spinal stenosis or a combination of the two factors.  Plan Continue present medication  Toileting schedule

## 2012-09-25 NOTE — Assessment & Plan Note (Signed)
Cramps - sodium, potassium and calcium have been normal in the past. We will recheck. There is good circulation on physical exam. Unlikely that spinal stenosis is the cause.  Plan Cinchona products are a way to take quinine, which is the most effective drug to take for cramps. You can research products on-line.  Lab orders are entered which you can have drawn at your convenience - fasting will be best.

## 2012-09-25 NOTE — Assessment & Plan Note (Signed)
Incontinence bowel and bladder may be related to progressive spinal stenosis. He does have chronic leg pain but is able to ambulate w/o assistance.  Plan Consider re-consultation with neurosurgery.

## 2012-09-25 NOTE — Progress Notes (Signed)
Subjective:    Patient ID: [name], male    DOB: April 07, 1932, 79 y.o.   MRN: [id number]  HPI [name] presents with c/o continuing and spreading muscle cramps. He has not been able to sleep more than a couple of hours for several weeks due to the cramps. He has had previous evaluation looking at 'lytes and other potential causes. He has tried without success tonic water, ginko biloba and gabapentin. He does walk and performs stretching exercises in the evening. He scribes chronic cramps but no in a restless leg syndrome manner.  [name] also c/o urinary incontinence. He is treated for BPH medically on alpha blocker and 5HT inhibitor. He also has occasional fecal incontinence and may hve decreased sphincter tone. He is concerned based on his reading that both problems may be related to progressive spinal stenosis, a known diagnosis for him.  Past Medical History  Diagnosis Date  . HERPES SIMPLEX INFECTION 01/19/2007  . TINEA PEDIS 11/15/2009  . DIABETES MELLITUS 01/19/2007  . HYPERLIPIDEMIA 01/19/2007  . DEPRESSION, CHRONIC 06/24/2007  . OBSTRUCTIVE SLEEP APNEA 01/19/2007  . Carpal tunnel syndrome 03/24/2007  . HYPERTENSION 01/19/2007  . Intermediate coronary syndrome 08/29/2008  . CORONARY ARTERY DISEASE 01/19/2007  . URI 01/11/2009  . OSTEOARTHRITIS 06/24/2007  . SHOULDER PAIN, LEFT 03/24/2007  . SPINAL STENOSIS, LUMBAR 06/03/2009  . BACK PAIN, CHRONIC 04/18/2009  . PLANTAR FASCIITIS, RIGHT 01/19/2007  . LEG CRAMPS, NOCTURNAL 10/28/2007  . HEART MURMUR, SYSTOLIC 01/02/2008  . Diarrhea 02/23/2007  . BENIGN PROSTATIC HYPERTROPHY, HX OF 01/19/2007  . PERCUTANEOUS TRANSLUMINAL CORONARY ANGIOPLASTY, HX OF 01/19/2007  . Other postprocedural status(V45.89) 01/19/2007  . DIABETES MELLITUS, TYPE II, WITH NEUROLOGICAL COMPLICATIONS 06/17/2010  . Leg cramps, sleep related 08/05/2010   Past Surgical History  Procedure Laterality Date  . Arthroscopy, knee of hx    . Plastic joint in thumb    . Inguinal  herniorrhapy, hx    . Percutaneous transluminal coronary angioplasty, hx of  2004    Dr. Chales Abrahams  . Ptca/stent  2004   Family History  Problem Relation Age of Onset  . Diabetes Other   . Coronary artery disease Other    History   Social History  . Marital Status: Married    Spouse Name: N/A    Number of Children: N/A  . Years of Education: N/A   Occupational History  . 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 Smoking status: Former Smoker    Quit date: 04/14/1983  . Smokeless tobacco: Not on file     Comment: stopped 1985  . Alcohol Use: No  . Drug Use: No  . Sexually Active: Not on file   Other Topics Concern  . Not on file   Social History Narrative   Married in 1959 - 16yrs, divorced; married 1974 - 1.5 years, divorced; married 1985 1 daughter 24; 24 step daughters; 2 grandchildren.    Current Outpatient Prescriptions on File Prior to Visit  Medication Sig Dispense Refill  . acetaminophen (TYLENOL) 500 MG tablet Take 500 mg by mouth as directed.        Marland Kitchen aspirin 81 MG EC tablet Take 81 mg by mouth daily.       . Coenzyme Q10 (CO Q 10 PO) Take by mouth.      . Eszopiclone (ESZOPICLONE) 3 MG TABS Take 1 tablet (3 mg total) by mouth at bedtime. Take immediately before bedtime  30 tablet  11  . finasteride (PROSCAR) 5 MG tablet Take 1 tablet (5 mg total) by mouth daily.  90 tablet  3  . furosemide (LASIX) 40 MG tablet Take 1 tablet (40 mg total) by mouth daily.  90 tablet  3  . Glucose Blood (FREESTYLE LITE TEST VI)       . glucose blood (FREESTYLE LITE) test strip Use as instructed daily to test blood glucose. Dx: 250.00  100 each  3  . HYDROcodone-acetaminophen (NORCO/VICODIN) 5-325 MG per tablet Take 1 tablet by mouth every 6 (six) hours as needed for pain.  90 tablet  1  . JANUVIA 100 MG tablet TAKE 1 TABLET BY MOUTH ONCE DAILY  30 tablet  11  . losartan (COZAAR) 100 MG tablet TAKE 1 TABLET EVERY DAY  30 tablet  5  . meloxicam  (MOBIC) 15 MG tablet Take 1 tablet (15 mg total) by mouth daily.  90 tablet  0  . metFORMIN (GLUCOPHAGE) 1000 MG tablet Take 1 tablet (1,000 mg total) by mouth 2 (two) times daily with a meal.  180 tablet  3  . metoprolol tartrate (LOPRESSOR) 25 MG tablet Take 1 tablet (25 mg total) by mouth 2 (two) times daily.  180 tablet  3  . tamsulosin (FLOMAX) 0.4 MG CAPS Take 1 capsule (0.4 mg total) by mouth daily.  90 capsule  3  . valsartan (DIOVAN) 160 MG tablet Take 1 tablet (160 mg total) by mouth daily.  90 tablet  3   No current facility-administered medications on file prior to visit.       Review of Systems System review is negative for any constitutional, cardiac, pulmonary, GI or neuro symptoms or complaints other than as described in the HPI.     Objective:   Physical Exam Filed Vitals:   09/22/12 1119  BP: 128/78  Pulse: 55  Temp: 97.3 F (36.3 C)   Gen'l- WNWD white man in no distress HEENT - C&S clear, PERRLA Cor- 2+ radial, RRR Pulm normal respirations MSK - no deformities, no evidence of muscle injury Neuro - A&O x 3, CN II-XII normal, normal ambulation w/o assistance.       Assessment & Plan:

## 2012-09-26 ENCOUNTER — Encounter: Payer: Self-pay | Admitting: Internal Medicine

## 2012-09-26 ENCOUNTER — Other Ambulatory Visit: Payer: Self-pay

## 2012-09-26 MED ORDER — HYDROCODONE-ACETAMINOPHEN 5-325 MG PO TABS: 1.0000 | ORAL_TABLET | Freq: Four times a day (QID) | ORAL | Status: DC | PRN

## 2012-09-26 NOTE — Telephone Encounter (Signed)
Hydrocodone called in to [facility] mail order

## 2012-09-27 ENCOUNTER — Telehealth: Payer: Self-pay

## 2012-09-27 NOTE — Telephone Encounter (Signed)
Phone call to [facility] to let them know I made a mistake and patients Hydrocodone should have been called [facility] not [facility].. This was cancelled and script called to [facility]

## 2012-09-27 NOTE — Telephone Encounter (Signed)
Phone call to patient to see if the prescription for Hydrocodone 5/325 should be called to [facility] or [facility]

## 2012-09-28 ENCOUNTER — Other Ambulatory Visit (INDEPENDENT_AMBULATORY_CARE_PROVIDER_SITE_OTHER): Payer: Medicare Other

## 2012-09-28 DIAGNOSIS — I10 Essential (primary) hypertension: Secondary | ICD-10-CM

## 2012-09-28 DIAGNOSIS — E119 Type 2 diabetes mellitus without complications: Secondary | ICD-10-CM

## 2012-09-28 DIAGNOSIS — R252 Cramp and spasm: Secondary | ICD-10-CM

## 2012-09-28 DIAGNOSIS — E785 Hyperlipidemia, unspecified: Secondary | ICD-10-CM

## 2012-09-28 LAB — CBC WITH DIFFERENTIAL/PLATELET
Basophils Relative: 0.4 % (ref 0.0–3.0)
Eosinophils Absolute: 0.1 10*3/uL (ref 0.0–0.7)
Eosinophils Relative: 2.2 % (ref 0.0–5.0)
Hemoglobin: 14.8 g/dL (ref 13.0–17.0)
Lymphocytes Relative: 28.7 % (ref 12.0–46.0)
MCHC: 33.8 g/dL (ref 30.0–36.0)
MCV: 90.9 fl (ref 78.0–100.0)
Neutro Abs: 3.5 10*3/uL (ref 1.4–7.7)
RBC: 4.83 Mil/uL (ref 4.22–5.81)
WBC: 6.1 10*3/uL (ref 4.5–10.5)

## 2012-09-28 LAB — HEPATIC FUNCTION PANEL
AST: 29 U/L (ref 0–37)
Albumin: 4.5 g/dL (ref 3.5–5.2)
Alkaline Phosphatase: 56 U/L (ref 39–117)
Bilirubin, Direct: 0.1 mg/dL (ref 0.0–0.3)
Total Bilirubin: 0.9 mg/dL (ref 0.3–1.2)

## 2012-09-28 LAB — COMPREHENSIVE METABOLIC PANEL
ALT: 25 U/L (ref 0–53)
CO2: 23 mEq/L (ref 19–32)
Calcium: 9.4 mg/dL (ref 8.4–10.5)
Chloride: 100 mEq/L (ref 96–112)
GFR: 100.66 mL/min (ref >60.00)
Potassium: 4.4 mEq/L (ref 3.5–5.1)
Sodium: 139 mEq/L (ref 135–145)
Total Protein: 7.2 g/dL (ref 6.0–8.3)

## 2012-09-28 LAB — HEMOGLOBIN A1C: Hgb A1c MFr Bld: 6.4 % (ref 4.6–6.5)

## 2012-10-13 ENCOUNTER — Encounter (HOSPITAL_BASED_OUTPATIENT_CLINIC_OR_DEPARTMENT_OTHER): Payer: Self-pay | Admitting: Emergency Medicine

## 2012-10-13 ENCOUNTER — Observation Stay (HOSPITAL_COMMUNITY): Payer: Medicare Other

## 2012-10-13 ENCOUNTER — Observation Stay (HOSPITAL_BASED_OUTPATIENT_CLINIC_OR_DEPARTMENT_OTHER)
Admission: EM | Admit: 2012-10-13 | Discharge: 2012-10-14 | Disposition: A | Discharge status: Home / Self Care | Payer: Medicare Other | Attending: Internal Medicine | Admitting: Internal Medicine

## 2012-10-13 ENCOUNTER — Emergency Department (HOSPITAL_BASED_OUTPATIENT_CLINIC_OR_DEPARTMENT_OTHER): Payer: Medicare Other

## 2012-10-13 DIAGNOSIS — E1149 Type 2 diabetes mellitus with other diabetic neurological complication: Secondary | ICD-10-CM | POA: Insufficient documentation

## 2012-10-13 DIAGNOSIS — N12 Tubulo-interstitial nephritis, not specified as acute or chronic: Principal | ICD-10-CM | POA: Diagnosis present

## 2012-10-13 DIAGNOSIS — K56609 Unspecified intestinal obstruction, unspecified as to partial versus complete obstruction: Secondary | ICD-10-CM | POA: Diagnosis present

## 2012-10-13 DIAGNOSIS — E876 Hypokalemia: Secondary | ICD-10-CM | POA: Diagnosis present

## 2012-10-13 DIAGNOSIS — I10 Essential (primary) hypertension: Secondary | ICD-10-CM | POA: Insufficient documentation

## 2012-10-13 DIAGNOSIS — M549 Dorsalgia, unspecified: Secondary | ICD-10-CM | POA: Diagnosis present

## 2012-10-13 DIAGNOSIS — K5900 Constipation, unspecified: Secondary | ICD-10-CM | POA: Diagnosis present

## 2012-10-13 DIAGNOSIS — R509 Fever, unspecified: Secondary | ICD-10-CM | POA: Insufficient documentation

## 2012-10-13 DIAGNOSIS — N390 Urinary tract infection, site not specified: Secondary | ICD-10-CM

## 2012-10-13 DIAGNOSIS — E1140 Type 2 diabetes mellitus with diabetic neuropathy, unspecified: Secondary | ICD-10-CM | POA: Diagnosis present

## 2012-10-13 DIAGNOSIS — E782 Mixed hyperlipidemia: Secondary | ICD-10-CM | POA: Diagnosis present

## 2012-10-13 DIAGNOSIS — E1142 Type 2 diabetes mellitus with diabetic polyneuropathy: Secondary | ICD-10-CM | POA: Insufficient documentation

## 2012-10-13 DIAGNOSIS — N10 Acute tubulo-interstitial nephritis: Secondary | ICD-10-CM

## 2012-10-13 DIAGNOSIS — E785 Hyperlipidemia, unspecified: Secondary | ICD-10-CM | POA: Insufficient documentation

## 2012-10-13 DIAGNOSIS — I119 Hypertensive heart disease without heart failure: Secondary | ICD-10-CM | POA: Diagnosis present

## 2012-10-13 DIAGNOSIS — Z79899 Other long term (current) drug therapy: Secondary | ICD-10-CM | POA: Insufficient documentation

## 2012-10-13 LAB — CBC WITH DIFFERENTIAL/PLATELET
Basophils Absolute: 0 10*3/uL (ref 0.0–0.1)
Basophils Relative: 0 % (ref 0–1)
Lymphocytes Relative: 8 % — ABNORMAL LOW (ref 12–46)
MCHC: 36 g/dL (ref 30.0–36.0)
Neutro Abs: 10.1 10*3/uL — ABNORMAL HIGH (ref 1.7–7.7)
Neutrophils Relative %: 80 % — ABNORMAL HIGH (ref 43–77)
Platelets: 184 10*3/uL (ref 150–400)
RDW: 12.4 % (ref 11.5–15.5)
WBC: 12.6 10*3/uL — ABNORMAL HIGH (ref 4.0–10.5)

## 2012-10-13 LAB — URINE MICROSCOPIC-ADD ON

## 2012-10-13 LAB — CBC
Hemoglobin: 11.7 g/dL — ABNORMAL LOW (ref 13.0–17.0)
MCHC: 35 g/dL (ref 30.0–36.0)
RBC: 3.93 MIL/uL — ABNORMAL LOW (ref 4.22–5.81)

## 2012-10-13 LAB — COMPREHENSIVE METABOLIC PANEL
ALT: 15 U/L (ref 0–53)
AST: 16 U/L (ref 0–37)
Albumin: 3.7 g/dL (ref 3.5–5.2)
Alkaline Phosphatase: 64 U/L (ref 39–117)
CO2: 26 mEq/L (ref 19–32)
Chloride: 96 mEq/L (ref 96–112)
Creatinine, Ser: 0.7 mg/dL (ref 0.50–1.35)
Potassium: 3.2 mEq/L — ABNORMAL LOW (ref 3.5–5.1)
Sodium: 136 mEq/L (ref 135–145)
Total Bilirubin: 1 mg/dL (ref 0.3–1.2)

## 2012-10-13 LAB — GLUCOSE, CAPILLARY
Glucose-Capillary: 188 mg/dL — ABNORMAL HIGH (ref 70–99)
Glucose-Capillary: 132 mg/dL — ABNORMAL HIGH (ref 70–99)

## 2012-10-13 LAB — URINALYSIS, ROUTINE W REFLEX MICROSCOPIC
Glucose, UA: NEGATIVE mg/dL
Ketones, ur: 15 mg/dL — AB
Specific Gravity, Urine: 1.014 (ref 1.005–1.030)
pH: 6 (ref 5.0–8.0)

## 2012-10-13 LAB — CREATININE, SERUM
Creatinine, Ser: 0.62 mg/dL (ref 0.50–1.35)
GFR calc non Af Amer: >90 mL/min (ref >90)

## 2012-10-13 MED ORDER — SODIUM CHLORIDE 0.9 % IJ SOLN
3.0000 mL | Freq: Two times a day (BID) | INTRAMUSCULAR | Status: DC
  Administered 2012-10-13 – 2012-10-14 (×3): 3 mL via INTRAVENOUS

## 2012-10-13 MED ORDER — DEXTROSE 5 % IV SOLN
1.0000 g | Freq: Once | INTRAVENOUS | Status: AC
  Administered 2012-10-13: 1 g via INTRAVENOUS
  Filled 2012-10-13: qty 10

## 2012-10-13 MED ORDER — HYDROMORPHONE HCL PF 1 MG/ML IJ SOLN: 1.0000 mg | INTRAMUSCULAR | Status: DC | PRN

## 2012-10-13 MED ORDER — IOHEXOL 300 MG/ML  SOLN
100.0000 mL | Freq: Once | INTRAMUSCULAR | Status: AC | PRN
  Administered 2012-10-13: 100 mL via INTRAVENOUS

## 2012-10-13 MED ORDER — HYDRALAZINE HCL 20 MG/ML IJ SOLN: 10.0000 mg | Freq: Four times a day (QID) | INTRAMUSCULAR | Status: DC | PRN

## 2012-10-13 MED ORDER — INSULIN ASPART 100 UNIT/ML ~~LOC~~ SOLN
0.0000 [IU] | SUBCUTANEOUS | Status: DC
  Administered 2012-10-13: 2 [IU] via SUBCUTANEOUS
  Administered 2012-10-13: 5 [IU] via SUBCUTANEOUS
  Administered 2012-10-13: 2 [IU] via SUBCUTANEOUS
  Administered 2012-10-13 – 2012-10-14 (×2): 1 [IU] via SUBCUTANEOUS

## 2012-10-13 MED ORDER — SODIUM CHLORIDE 0.9 % IV BOLUS (SEPSIS)
500.0000 mL | Freq: Once | INTRAVENOUS | Status: AC
  Administered 2012-10-13: 500 mL via INTRAVENOUS

## 2012-10-13 MED ORDER — POTASSIUM CHLORIDE 10 MEQ/100ML IV SOLN
10.0000 meq | INTRAVENOUS | Status: AC
  Administered 2012-10-13 (×3): 10 meq via INTRAVENOUS
  Filled 2012-10-13 (×3): qty 100

## 2012-10-13 MED ORDER — ALUM & MAG HYDROXIDE-SIMETH 200-200-20 MG/5ML PO SUSP: 30.0000 mL | Freq: Four times a day (QID) | ORAL | Status: DC | PRN

## 2012-10-13 MED ORDER — ACETAMINOPHEN 325 MG PO TABS
650.0000 mg | ORAL_TABLET | Freq: Four times a day (QID) | ORAL | Status: DC | PRN
  Administered 2012-10-13 – 2012-10-14 (×2): 650 mg via ORAL
  Filled 2012-10-13 (×2): qty 2

## 2012-10-13 MED ORDER — ONDANSETRON HCL 4 MG PO TABS: 4.0000 mg | ORAL_TABLET | Freq: Four times a day (QID) | ORAL | Status: DC | PRN

## 2012-10-13 MED ORDER — LACTULOSE 10 GM/15ML PO SOLN
20.0000 g | ORAL | Status: DC | PRN
  Administered 2012-10-13: 20 g via ORAL
  Filled 2012-10-13 (×2): qty 30

## 2012-10-13 MED ORDER — HYDROCODONE-ACETAMINOPHEN 5-325 MG PO TABS: 1.0000 | ORAL_TABLET | ORAL | Status: DC | PRN

## 2012-10-13 MED ORDER — SODIUM CHLORIDE 0.9 % IV SOLN
INTRAVENOUS | Status: DC
  Administered 2012-10-13 (×2): via INTRAVENOUS

## 2012-10-13 MED ORDER — HEPARIN SODIUM (PORCINE) 5000 UNIT/ML IJ SOLN
5000.0000 [IU] | Freq: Three times a day (TID) | INTRAMUSCULAR | Status: DC
  Administered 2012-10-13 – 2012-10-14 (×4): 5000 [IU] via SUBCUTANEOUS
  Filled 2012-10-13 (×8): qty 1

## 2012-10-13 MED ORDER — ACETAMINOPHEN 650 MG RE SUPP: 650.0000 mg | Freq: Four times a day (QID) | RECTAL | Status: DC | PRN

## 2012-10-13 MED ORDER — BISACODYL 10 MG RE SUPP
10.0000 mg | Freq: Once | RECTAL | Status: AC
  Administered 2012-10-13: 10 mg via RECTAL
  Filled 2012-10-13: qty 1

## 2012-10-13 MED ORDER — ONDANSETRON HCL 4 MG/2ML IJ SOLN
4.0000 mg | Freq: Four times a day (QID) | INTRAMUSCULAR | Status: DC | PRN
  Administered 2012-10-13: 4 mg via INTRAVENOUS
  Filled 2012-10-13: qty 2

## 2012-10-13 MED ORDER — DEXTROSE 5 % IV SOLN
1.0000 g | INTRAVENOUS | Status: DC
  Administered 2012-10-13 – 2012-10-14 (×2): 1 g via INTRAVENOUS
  Filled 2012-10-13 (×4): qty 10

## 2012-10-13 MED ORDER — SODIUM CHLORIDE 0.9 % IV SOLN
INTRAVENOUS | Status: DC
  Administered 2012-10-13 (×2): via INTRAVENOUS

## 2012-10-13 NOTE — ED Notes (Signed)
Patient transported to X-ray 

## 2012-10-13 NOTE — Progress Notes (Signed)
79 year old with multiple medical problems presents to [facility] complaining of fever T at 101. Has had fever for last 2 weeks. He is also having cramping abdominal pain. X ray show small bowel dilation. CT abd pelvis will be done. Dr Saralyn Pilar will follow results. Patient was accepted to telemetry bed.  Fredi Hurtado, Md.

## 2012-10-13 NOTE — Progress Notes (Signed)
Utilization Review Completed.   Ave Scharnhorst, RN, BSN Nurse Case Manager  [phone]  

## 2012-10-13 NOTE — Progress Notes (Signed)
Courtesy note - patients request  RFeviewed notes, labs and x-rays. Patient with moderate UTI, dilated loop of bowel in setting of constipation.  Exam - Alert and oriented Cor- RRR PUlm - normal respiration Abd - distended, absent BS on my exam, no guarding or rebound, no marked tenderness  Plan 1. UTI continue antibiotics 2. Dilated loop of bowel - non-acute abdomen. Question of ileus vs partial SBO vs colitis.  Plan- CT abdomen as ordered - patient is agreeable 3. Constipation - suppository ordered. If this fails will give lactulose. 4. DM -  Lab Results  Component Value Date   HGBA1C 6.4 09/28/2012   OK for sliding scale for now  Hopefully home in AM

## 2012-10-13 NOTE — ED Notes (Signed)
Fever since 2030 last pm

## 2012-10-13 NOTE — H&P (Signed)
History and Physical       Hospital Admission Note Date: 10/13/2012  Patient name: [name] Medical record number: [id number] Date of birth: 1931-12-08 Age: 79 y.o. Gender: male PCP: Illene Regulus, MD    Chief Complaint:  Fevers with increasing generalized weakness x2 days  HPI: Patient is a 79 year old male with history of diabetes, hypertension, lipidemia, CAD presented initially to [facility] ED  with fevers and increasing generalized weakness for last 2-3 days. Patient reported that he started having low-grade fevers on Sunday, nausea, loss of appetite and fatigue. He continued to get worse, having increasing urinary frequency but no significant dysuria or hematuria. At the time of triage, patient's temp was 102.8. Patient also reported that he occasionally has constipation, last BM was 2 days ago. In ED, WBC was 12.6 with neutrophils 80%, potassium of 3.2, UA positive for UTI. Abdominal x-ray showed large right-sided stool burden with mildly dilated single loop of small bowel in the left mid abdomen, no pathologic air-fluid levels, early small bowel obstruction versus enteric infection. Patient endorses nausea but no vomiting.  Review of Systems:  Constitutional: + fever, chills, diaphoresis, poor appetite and fatigue.  HEENT: Denies photophobia, eye pain, redness, hearing loss, ear pain, congestion, sore throat, rhinorrhea, sneezing, mouth sores, trouble swallowing, neck pain, neck stiffness and tinnitus.   Respiratory: Denies SOB, DOE, cough, chest tightness,  and wheezing.   Cardiovascular: Denies chest pain, palpitations and leg swelling.  Gastrointestinal: Denies vomiting, abdominal pain, diarrhea, constipation, blood in stool and abdominal distention. + nausea Genitourinary: Denies dysuria, +urgency, + frequency,  denies hematuria, flank pain and difficulty urinating.  Musculoskeletal: Denies myalgias, back pain, joint swelling,  arthralgias and gait problem.  Skin: Denies pallor, rash and wound.  Neurological: Denies dizziness, seizures, syncope, weakness, light-headedness, numbness and headaches.  Hematological: Denies adenopathy. Easy bruising, personal or family bleeding history  Psychiatric/Behavioral: Denies suicidal ideation, mood changes, confusion, nervousness, sleep disturbance and agitation  Past Medical History: Past Medical History  Diagnosis Date  . HERPES SIMPLEX INFECTION 01/19/2007  . TINEA PEDIS 11/15/2009  . DIABETES MELLITUS 01/19/2007  . HYPERLIPIDEMIA 01/19/2007  . DEPRESSION, CHRONIC 06/24/2007  . OBSTRUCTIVE SLEEP APNEA 01/19/2007  . Carpal tunnel syndrome 03/24/2007  . HYPERTENSION 01/19/2007  . Intermediate coronary syndrome 08/29/2008  . CORONARY ARTERY DISEASE 01/19/2007  . URI 01/11/2009  . OSTEOARTHRITIS 06/24/2007  . SHOULDER PAIN, LEFT 03/24/2007  . SPINAL STENOSIS, LUMBAR 06/03/2009  . BACK PAIN, CHRONIC 04/18/2009  . PLANTAR FASCIITIS, RIGHT 01/19/2007  . LEG CRAMPS, NOCTURNAL 10/28/2007  . HEART MURMUR, SYSTOLIC 01/02/2008  . Diarrhea 02/23/2007  . BENIGN PROSTATIC HYPERTROPHY, HX OF 01/19/2007  . PERCUTANEOUS TRANSLUMINAL CORONARY ANGIOPLASTY, HX OF 01/19/2007  . Other postprocedural status(V45.89) 01/19/2007  . DIABETES MELLITUS, TYPE II, WITH NEUROLOGICAL COMPLICATIONS 06/17/2010  . Leg cramps, sleep related 08/05/2010   Past Surgical History  Procedure Laterality Date  . Arthroscopy, knee of hx    . Plastic joint in thumb    . Inguinal herniorrhapy, hx    . Percutaneous transluminal coronary angioplasty, hx of  2004    Dr. Chales Abrahams  . Ptca/stent  2004    Medications: Prior to Admission medications   Medication Sig Start Date End Date Taking? Authorizing Provider  acetaminophen (TYLENOL) 500 MG tablet Take 500 mg by mouth as directed.     Yes Historical Provider, MD  finasteride (PROSCAR) 5 MG tablet Take 1 tablet (5 mg total) by mouth daily. 07/15/12  Yes Jacques Navy, MD  furosemide (LASIX) 40 MG tablet Take 1 tablet (40 mg total) by mouth daily. 07/15/12  Yes Jacques Navy, MD  Glucose Blood (FREESTYLE LITE TEST VI)  01/07/12  Yes Historical Provider, MD  glucose blood (FREESTYLE LITE) test strip Use as instructed daily to test blood glucose. Dx: 250.00 07/15/11  Yes Jacques Navy, MD  HYDROcodone-acetaminophen (NORCO/VICODIN) 5-325 MG per tablet Take 1 tablet by mouth every 6 (six) hours as needed for pain. 09/26/12  Yes Jacques Navy, MD  JANUVIA 100 MG tablet TAKE 1 TABLET BY MOUTH ONCE DAILY 01/20/12  Yes Jacques Navy, MD  losartan (COZAAR) 100 MG tablet TAKE 1 TABLET EVERY DAY 09/06/12  Yes Jacques Navy, MD  meloxicam (MOBIC) 15 MG tablet Take 1 tablet (15 mg total) by mouth daily. 08/23/12  Yes Jacques Navy, MD  metFORMIN (GLUCOPHAGE) 1000 MG tablet Take 1 tablet (1,000 mg total) by mouth 2 (two) times daily with a meal. 07/15/12  Yes Jacques Navy, MD  metoprolol tartrate (LOPRESSOR) 25 MG tablet Take 1 tablet (25 mg total) by mouth 2 (two) times daily. 07/15/12  Yes Jacques Navy, MD  tamsulosin (FLOMAX) 0.4 MG CAPS Take 1 capsule (0.4 mg total) by mouth daily. 09/15/12  Yes Jacques Navy, MD  aspirin 81 MG EC tablet Take 81 mg by mouth daily.     Historical Provider, MD  atorvastatin (LIPITOR) 80 MG tablet Take 40 mg by mouth daily. 09/15/12   Jacques Navy, MD  Coenzyme Q10 (CO Q 10 PO) Take by mouth.    Historical Provider, MD  Eszopiclone (ESZOPICLONE) 3 MG TABS Take 1 tablet (3 mg total) by mouth at bedtime. Take immediately before bedtime 08/26/10   Jacques Navy, MD  valsartan (DIOVAN) 160 MG tablet Take 1 tablet (160 mg total) by mouth daily. 08/26/12   Jacques Navy, MD  valsartan-hydrochlorothiazide (DIOVAN HCT) 160-12.5 MG per tablet Take 1 tablet by mouth daily. 09/22/12   Jacques Navy, MD    Allergies:  No Known Allergies  Social History:  reports that he quit smoking about 29 years ago. He does not have any  smokeless tobacco history on file. He reports that he occasionally drinks alcohol or use illicit drugs.he lives at home with his wife and is functional reactive with his ADLs  Family History: Family History  Problem Relation Age of Onset  . Diabetes Other   . Coronary artery disease Other     Physical Exam: Blood pressure 150/92, pulse 79, temperature 98.2 F (36.8 C), temperature source Oral, resp. rate 20, height 5\' 5"  (1.651 m), weight 75.07 kg (165 lb 8 oz), SpO2 100.00%. General: Alert, awake, oriented x3, in no acute distress. HEENT: normocephalic, atraumatic, anicteric sclera, pink conjunctiva, pupils equal and reactive to light and accomodation, oropharynx clear Neck: supple, no masses or lymphadenopathy, no goiter, no bruits  Heart: Regular rate and rhythm, without murmurs, rubs or gallops. Lungs: Clear to auscultation bilaterally, no wheezing, rales or rhonchi. Abdomen: Soft, nontender, distended,  Hypoactive bowel sounds, no masses. Extremities: No clubbing, cyanosis or edema with positive pedal pulses. Neuro: Grossly intact, no focal neurological deficits, strength 5/5 upper and lower extremities bilaterally Psych: alert and oriented x 3, normal mood and affect Skin: no rashes or lesions, warm and dry   LABS on Admission:  Basic Metabolic Panel:  Recent Labs Lab 10/13/12 0420  NA 136  K 3.2*  CL 96  CO2 26  GLUCOSE 216*  BUN  15  CREATININE 0.70  CALCIUM 9.6   Liver Function Tests:  Recent Labs Lab 10/13/12 0420  AST 16  ALT 15  ALKPHOS 64  BILITOT 1.0  PROT 7.0  ALBUMIN 3.7    Recent Labs Lab 10/13/12 0420  LIPASE 20   No results found for this basename: AMMONIA,  in the last 168 hours CBC:  Recent Labs Lab 10/13/12 0420  WBC 12.6*  NEUTROABS 10.1*  HGB 13.6  HCT 37.8*  MCV 85.3  PLT 184   Cardiac Enzymes: No results found for this basename: CKTOTAL, CKMB, CKMBINDEX, TROPONINI,  in the last 168 hours BNP: No components found with  this basename: POCBNP,  CBG:  Recent Labs Lab 10/13/12 0721  GLUCAP 188*     Radiological Exams on Admission: Dg Abd Acute W/chest  10/13/2012   *RADIOLOGY REPORT*  Clinical Data: Epigastric pain.  Nausea.  Diarrhea.  Fever.  ACUTE ABDOMEN SERIES (ABDOMEN 2 VIEW & CHEST 1 VIEW)  Comparison: Chest radiograph 02/06/2011.  Findings: Faint nodular density is present along the left heart border.  This appears representing granuloma and was likely present on the prior exam 08/30/2008.  Subsegmental atelectasis at the lung bases and in the right midlung.  No airspace disease. Cardiopericardial silhouette is mildly enlarged.  There is no free air underneath the hemidiaphragms.  Large stool burden.  Mildly dilated loops of small bowel is present in the left abdomen.  Stool and bowel gas extends to the descending colon and junction of the sigmoid.  Paucity of rectal gas present.  IMPRESSION:  1.  Mildly dilated single loop of small bowel in the left mid abdomen.  No pathologic air fluid level.  The cause for this mildly dilated loop of bowel is unclear.  Early small bowel obstruction is difficult to exclude although not favored.  This pattern can be associated with enteric infection. Consider CT for further evaluation. 2.  Large right-sided stool burden.   Original Report Authenticated By: Andreas Newport, M.D.    Assessment/Plan Principal Problem:   UTI (urinary tract infection) - Obtain urine culture, blood cultures, start patient on IV Rocephin    SBO (small bowel obstruction)/ constipation vs enteritis: Needs CT scan for further evaluation.  - Placed on Dulcolax supp, n.p.o., IV fluids - Patient and wife refused to have a CT scan abdomen until they speak with Dr. Arthur Holms (patient's pcp). I have discussed this with Dr Arthur Holms on the phone, will evaluate patient. - Continue Zofran as needed. No need of NGT as no vomiting. Patient refused any NGT or surgery if needed.     Active Problems:   DIABETES  MELLITUS: - For now placed on a sliding scale insulin  q 4hours    HYPERLIPIDEMIA: hold statins till NPO    HYPERTENSION: - place on hydralazine PRN until able to tolerate oral meds - will need to be started on all PO meds once taking oral    BACK PAIN, CHRONIC - Continue PRN tylenol or Dilaudid for now    Hypokalemia - Will replace IV   DVT prophylaxis:  heparin subcutaneous   CODE STATUS:  full code   Further plan will depend as patient's clinical course evolves and further radiologic and laboratory data become available.  I have discussed in detail with Dr. Arthur Holms, will assume care of the patient.  Time Spent on Admission: 45 mins  Daiquan Resnik M.D. [facility] 10/13/2012, 7:52 AM Pager: [phone]  If 7PM-7AM, please contact night-coverage [email or web address] Password [email or web address]

## 2012-10-13 NOTE — ED Provider Notes (Signed)
History    CSN: [id number] Arrival date & time 10/13/12  0358  First MD Initiated Contact with Patient 10/13/12 0410     Chief Complaint  Patient presents with  . Fever   (Consider location/radiation/quality/duration/timing/severity/associated sxs/prior Treatment) Patient is a 79 y.o. male presenting with fever. The history is provided by the patient and the spouse.  Fever Max temp prior to arrival:  102.8 Temp source:  Oral Severity:  Moderate Onset quality:  Gradual Duration:  6 hours Timing:  Intermittent Progression:  Unchanged Chronicity:  Recurrent Relieved by:  Acetaminophen Worsened by:  Nothing tried Ineffective treatments:  None tried Associated symptoms: confusion   Associated symptoms: no chest pain, no cough, no dysuria, no headaches, no nausea, no rash and no vomiting   Associated symptoms comment:  Frequency and dark urine and cramping Risk factors: no sick contacts    Past Medical History  Diagnosis Date  . HERPES SIMPLEX INFECTION 01/19/2007  . TINEA PEDIS 11/15/2009  . DIABETES MELLITUS 01/19/2007  . HYPERLIPIDEMIA 01/19/2007  . DEPRESSION, CHRONIC 06/24/2007  . OBSTRUCTIVE SLEEP APNEA 01/19/2007  . Carpal tunnel syndrome 03/24/2007  . HYPERTENSION 01/19/2007  . Intermediate coronary syndrome 08/29/2008  . CORONARY ARTERY DISEASE 01/19/2007  . URI 01/11/2009  . OSTEOARTHRITIS 06/24/2007  . SHOULDER PAIN, LEFT 03/24/2007  . SPINAL STENOSIS, LUMBAR 06/03/2009  . BACK PAIN, CHRONIC 04/18/2009  . PLANTAR FASCIITIS, RIGHT 01/19/2007  . LEG CRAMPS, NOCTURNAL 10/28/2007  . HEART MURMUR, SYSTOLIC 01/02/2008  . Diarrhea 02/23/2007  . BENIGN PROSTATIC HYPERTROPHY, HX OF 01/19/2007  . PERCUTANEOUS TRANSLUMINAL CORONARY ANGIOPLASTY, HX OF 01/19/2007  . Other postprocedural status(V45.89) 01/19/2007  . DIABETES MELLITUS, TYPE II, WITH NEUROLOGICAL COMPLICATIONS 06/17/2010  . Leg cramps, sleep related 08/05/2010   Past Surgical History  Procedure Laterality Date  .  Arthroscopy, knee of hx    . Plastic joint in thumb    . Inguinal herniorrhapy, hx    . Percutaneous transluminal coronary angioplasty, hx of  2004    Dr. Chales Abrahams  . Ptca/stent  2004   Family History  Problem Relation Age of Onset  . Diabetes Other   . Coronary artery disease Other    History  Substance Use Topics  . Smoking status: Former Smoker    Quit date: 04/14/1983  . Smokeless tobacco: Not on file     Comment: stopped 1985  . Alcohol Use: No    Review of Systems  Constitutional: Positive for fever.  HENT: Negative for neck pain and neck stiffness.   Respiratory: Negative for cough and shortness of breath.   Cardiovascular: Negative for chest pain.  Gastrointestinal: Negative for nausea and vomiting.  Genitourinary: Negative for dysuria.  Skin: Negative for rash.  Neurological: Negative for headaches.  Psychiatric/Behavioral: Positive for confusion.  All other systems reviewed and are negative.    Allergies  Review of patient's allergies indicates no known allergies.  Home Medications   Current Outpatient Rx  Name  Route  Sig  Dispense  Refill  . acetaminophen (TYLENOL) 500 MG tablet   Oral   Take 500 mg by mouth as directed.           . Glucose Blood (FREESTYLE LITE TEST VI)               . glucose blood (FREESTYLE LITE) test strip      Use as instructed daily to test blood glucose. Dx: 250.00   100 each   3   . HYDROcodone-acetaminophen (NORCO/VICODIN) 5-325 MG  per tablet   Oral   Take 1 tablet by mouth every 6 (six) hours as needed for pain.   90 tablet   1   . aspirin 81 MG EC tablet   Oral   Take 81 mg by mouth daily.          Marland Kitchen atorvastatin (LIPITOR) 80 MG tablet   Oral   Take 40 mg by mouth daily.         . Coenzyme Q10 (CO Q 10 PO)   Oral   Take by mouth.         . Eszopiclone (ESZOPICLONE) 3 MG TABS   Oral   Take 1 tablet (3 mg total) by mouth at bedtime. Take immediately before bedtime   30 tablet   11   .  finasteride (PROSCAR) 5 MG tablet   Oral   Take 1 tablet (5 mg total) by mouth daily.   90 tablet   3   . furosemide (LASIX) 40 MG tablet   Oral   Take 1 tablet (40 mg total) by mouth daily.   90 tablet   3   . JANUVIA 100 MG tablet      TAKE 1 TABLET BY MOUTH ONCE DAILY   30 tablet   11   . losartan (COZAAR) 100 MG tablet      TAKE 1 TABLET EVERY DAY   30 tablet   5   . meloxicam (MOBIC) 15 MG tablet   Oral   Take 1 tablet (15 mg total) by mouth daily.   90 tablet   0   . metFORMIN (GLUCOPHAGE) 1000 MG tablet   Oral   Take 1 tablet (1,000 mg total) by mouth 2 (two) times daily with a meal.   180 tablet   3   . metoprolol tartrate (LOPRESSOR) 25 MG tablet   Oral   Take 1 tablet (25 mg total) by mouth 2 (two) times daily.   180 tablet   3   . tamsulosin (FLOMAX) 0.4 MG CAPS   Oral   Take 1 capsule (0.4 mg total) by mouth daily.   90 capsule   3   . valsartan (DIOVAN) 160 MG tablet   Oral   Take 1 tablet (160 mg total) by mouth daily.   90 tablet   3   . valsartan-hydrochlorothiazide (DIOVAN HCT) 160-12.5 MG per tablet   Oral   Take 1 tablet by mouth daily.   90 tablet   3    BP 131/65  Pulse 85  Temp(Src) 98.7 F (37.1 C) (Oral)  Resp 18  SpO2 97% Physical Exam  Constitutional: He is oriented to person, place, and time. He appears well-developed and well-nourished. No distress.  HENT:  Head: Normocephalic and atraumatic.  Mouth/Throat: Oropharynx is clear and moist.  Eyes: Conjunctivae are normal. Pupils are equal, round, and reactive to light.  Neck: Normal range of motion. Neck supple.  Cardiovascular: Normal rate, regular rhythm and intact distal pulses.   Pulmonary/Chest: Effort normal and breath sounds normal. He has no wheezes. He has no rales.  Abdominal: Soft. Bowel sounds are increased. There is no tenderness. There is no rebound and no guarding.  Musculoskeletal: Normal range of motion. He exhibits no edema.  Neurological: He is  oriented to person, place, and time. He has normal reflexes.  Skin: Skin is warm and dry.  Psychiatric: He has a normal mood and affect.    ED Course  Procedures (including critical care time)  Labs Reviewed  URINALYSIS, ROUTINE W REFLEX MICROSCOPIC - Abnormal; Notable for the following:    Hgb urine dipstick TRACE (*)    Ketones, ur 15 (*)    Nitrite POSITIVE (*)    Leukocytes, UA MODERATE (*)    All other components within normal limits  CBC WITH DIFFERENTIAL - Abnormal; Notable for the following:    WBC 12.6 (*)    HCT 37.8 (*)    Neutrophils Relative % 80 (*)    Neutro Abs 10.1 (*)    Lymphocytes Relative 8 (*)    Monocytes Absolute 1.5 (*)    All other components within normal limits  URINE MICROSCOPIC-ADD ON - Abnormal; Notable for the following:    Bacteria, UA MANY (*)    All other components within normal limits  URINE CULTURE  COMPREHENSIVE METABOLIC PANEL  LIPASE, BLOOD   No results found. No diagnosis found.  MDM  UTI with fever and confusion, will admit.     Medications  cefTRIAXone (ROCEPHIN) 1 g in dextrose 5 % 50 mL IVPB (1 g Intravenous New Bag/Given 10/13/12 0456)  sodium chloride 0.9 % bolus 500 mL (not administered)  0.9 %  sodium chloride infusion (not administered)    Hospitalist would like CT scan to rule out other abdominal pathology  518 am Patient and wife refusing CT scan at this time as fever and cramping in abdomen are gone at this time.  They would like to speak with Dr. Debby Bud in the am regarding this test.    522 am EDP contacting hospitalist again, Dr. Sunnie Nielsen would like EDp to speak with the patient again to explain that there may be bowel pathology and this may not be diagnosed without CT scan  527 am- with nurse Raynelle Fanning present EDP explained at length findings of acute abdominal series in particular the loop of bowel and relayed Dr. Sumner Boast concerns over bowel pathology which may not have been completely evaluated on acute abdominal  series.  EDP explained at length all bowel pathology that may be seen on CT scan that may not be imaged on plain xray.  Patient and wife were very pleasant and appreciative and expressed understanding but are refusing CT scan at this time. Both patient and wife have decision making capacity to refuse.   Patient states he is not sure that he would even accept surgery if something were found due to his age.    532- EDP performed supervised prostate exam, ballotable no dominant nodules, slight tenderness   Atalie Oros K Lether Tesch-Rasch, MD 10/13/12 [phone]

## 2012-10-13 NOTE — ED Notes (Signed)
md discussed with patient and wife in detailed length about reason for abdominal ct and consequences of delaying test, both patient and wife were able to verbal express understanding of need for test and consequences if not completed and continue to defer testing until they speak with hospital md

## 2012-10-13 NOTE — Progress Notes (Signed)
Physical Therapy Evaluation Patient Details Name: [name] MRN: [id number] DOB: [birth year] Today's Date: 10/13/2012 Time: 8657-8469 PT Time Calculation (min): 21 min  PT Assessment / Plan / Recommendation History of Present Illness  Pt admitted with UTI.  Clinical Impression  Pt admitted with UTI. Pt currently with minimal functional limitations and states he is at baseline.   Pt states that at times his "knees get tired and give out".  Testing reveals that he is not at high risk of falls but does have a low fall risk.   PT does recommend Outpt PT balance program to address high level balance and pt is aware.  Also told pt that he may benefit from use of a RW when his legs are "tired".  At present, pt refusing Outpt PT and RW.  Will not follow as pt adamant that he is at baseline and refuses that further services are necessary.  In controlled environment, this PT agrees that he should be safe.      PT Assessment  All further PT needs can be met in the next venue of care    Follow Up Recommendations  Outpatient PT (Recommended Outpt PT f/u for balance program)                Equipment Recommendations  Rolling walker with 5" wheels         Frequency   N/A   Precautions / Restrictions Precautions Precautions: Fall Restrictions Weight Bearing Restrictions: No   Pertinent Vitals/Pain VSS, no pain      Mobility  Bed Mobility Bed Mobility: Rolling Right;Right Sidelying to Sit Rolling Right: 7: Independent Right Sidelying to Sit: 7: Independent Transfers Transfers: Sit to Stand;Stand to Sit Sit to Stand: 7: Independent;With upper extremity assist;From bed Stand to Sit: 7: Independent;With upper extremity assist;To bed Ambulation/Gait Ambulation/Gait Assistance: 7: Independent Ambulation Distance (Feet): 250 Feet Assistive device: None Ambulation/Gait Assistance Details: Pt ambulates without device.  Pt with occasional incr lateral lean to right but pt did not have LOB  that was significant. Pt states that he is ambulating at baseline.    Gait Pattern: Step-through pattern;Decreased stride length;Right steppage;Wide base of support Gait velocity: decreased Stairs: No Wheelchair Mobility Wheelchair Mobility: No         PT Diagnosis: Generalized weakness  PT Problem List: Decreased balance PT Treatment Interventions: Gait training;Balance training;Functional mobility training;Patient/family education     PT Goals(Current goals can be found in the care plan section)    Visit Information  Last PT Received On: 10/13/12 Assistance Needed: +1 History of Present Illness: Pt admitted with UTI.       Prior Functioning  Home Living Family/patient expects to be discharged to:: Private residence Living Arrangements: Spouse/significant other Available Help at Discharge: Family;Available 24 hours/day Type of Home: House Home Access: Stairs to enter [employer] of Steps: 6 Entrance Stairs-Rails: Left Home Layout: One level Home Equipment: Cane - single point Prior Function Level of Independence: Independent Gait / Transfers Assistance Needed:   Communication Communication: No difficulties    Cognition  Cognition Arousal/Alertness: Awake/alert Behavior During Therapy: WFL for tasks assessed/performed Overall Cognitive Status: Within Functional Limits for tasks assessed    Extremity/Trunk Assessment Lower Extremity Assessment Lower Extremity Assessment: Generalized weakness Cervical / Trunk Assessment Cervical / Trunk Assessment: Normal   Balance Standardized Balance Assessment Standardized Balance Assessment: Dynamic Gait Index Dynamic Gait Index Level Surface: Normal Change in Gait Speed: Normal Gait with Horizontal Head Turns: Normal Gait with Vertical Head Turns:  Normal Gait and Pivot Turn: Mild Impairment Step Over Obstacle: Mild Impairment Step Around Obstacles: Mild Impairment Steps: Mild Impairment Total Score: 20 High  Level Balance High Level Balance Comments: 20/24 on DGI suggesting low risk of falls.   End of Session PT - End of Session Equipment Utilized During Treatment: Gait belt Activity Tolerance: Patient tolerated treatment well Patient left: in bed;with call bell/phone within reach Nurse Communication: Mobility status  GP Functional Assessment Tool Used: clinical judgment Functional Limitation: Mobility: Walking and moving around Mobility: Walking and Moving Around Current Status (Z6109): At least 1 percent but less than 20 percent impaired, limited or restricted Mobility: Walking and Moving Around Goal Status [phone]): 0 percent impaired, limited or restricted Mobility: Walking and Moving Around Discharge Status [phone]): At least 1 percent but less than 20 percent impaired, limited or restricted   INGOLD,Kayly Kriegel 10/13/2012, 12:26 PM [facility] [phone] (pager)

## 2012-10-14 ENCOUNTER — Other Ambulatory Visit: Payer: Self-pay | Admitting: Internal Medicine

## 2012-10-14 DIAGNOSIS — N390 Urinary tract infection, site not specified: Secondary | ICD-10-CM

## 2012-10-14 LAB — URINE CULTURE

## 2012-10-14 LAB — BASIC METABOLIC PANEL
BUN: 11 mg/dL (ref 6–23)
Chloride: 101 mEq/L (ref 96–112)
Glucose, Bld: 153 mg/dL — ABNORMAL HIGH (ref 70–99)
Potassium: 3.4 mEq/L — ABNORMAL LOW (ref 3.5–5.1)

## 2012-10-14 LAB — CBC
HCT: 34.8 % — ABNORMAL LOW (ref 39.0–52.0)
Hemoglobin: 11.9 g/dL — ABNORMAL LOW (ref 13.0–17.0)
MCH: 29.5 pg (ref 26.0–34.0)
MCHC: 34.2 g/dL (ref 30.0–36.0)
MCV: 86.1 fL (ref 78.0–100.0)

## 2012-10-14 LAB — GLUCOSE, CAPILLARY: Glucose-Capillary: 142 mg/dL — ABNORMAL HIGH (ref 70–99)

## 2012-10-14 MED ORDER — CEFUROXIME AXETIL 250 MG PO TABS: 250.0000 mg | ORAL_TABLET | Freq: Two times a day (BID) | ORAL | Status: DC

## 2012-10-14 MED ORDER — POTASSIUM CHLORIDE CRYS ER 20 MEQ PO TBCR
40.0000 meq | EXTENDED_RELEASE_TABLET | Freq: Once | ORAL | Status: AC
  Administered 2012-10-14: 40 meq via ORAL
  Filled 2012-10-14: qty 2

## 2012-10-14 NOTE — Discharge Summary (Signed)
NAMEODARIUS, DINES NO.:  [id number]  MEDICAL RECORD NO.:  [id number]  LOCATION:  4742                         FACILITY:  [facility]  PHYSICIAN:  Rosalyn Gess. Jilian West, MD  DATE OF BIRTH:  1931/06/19  DATE OF ADMISSION:  10/13/2012 DATE OF DISCHARGE:  10/14/2012                              DISCHARGE SUMMARY   ADMITTING DIAGNOSES: 1. Urinary tract infection. 2. X-ray abnormality with dilated loop of bowel.  DISCHARGE DIAGNOSIS:  Pyelonephritis.  Procedures: CT abdomen/pelvis 10/13/12: IMPRESSION:  Vague areas of low density in the upper poles of both kidneys  which I suspect represent bilateral pyelonephritis. The patient  had no abnormalities in these areas on the prior MRI of the lumbar  spine dated 05/09/2009.   HISTORY OF THE PRESENT ILLNESS:  Mr. Sturtevant is a 79 year old gentleman with history of diabetes, hypertension, hyperlipidemia, coronary artery disease; [facility] with fevers and increasing generalized weakness for 3 days.  He reported low- grade fever starting on Sunday accompanied by nausea, loss of appetite, and fatigue.  For progressive symptoms and increasing urinary frequency with no dysuria or hematuria he presented [facility].  At the time of initial triage, his temperature was 102.8.  The patient also complained of occasional constipation with last bowel movement 2 days prior to admission. Initial evaluation revealed a white count of 12, 600 with 80% segs. Potassium was low at 3.2.  UA was positive for infection.  Plain x-ray of the abdomen showed right-sided stool burden with mildly dilated single loop of small bowel in the left mid abdomen.  No pathologic air- fluid levels were noted.  Early small bowel obstruction versus enteric infection could not be ruled out.  Patient was [facility] as a direct admit.  Please see the H and P for past medical history, family history, social history,  and full physical examination.  HOSPITAL COURSE: 1. Abnormal dilated loop of bowel.  A CT scan of the abdomen was     performed with no abnormality revealed. 2. GU/ID.  Patient was started on Rocephin 1 g q.24 for UTI.  A CT     scan did reveal the patient to have changes consistent with     pyelonephritis with stranding and hypodense areas.  There was no     evidence of obstruction.  Urine culture from July 3 showed no     growth at the time of discharge, but it is still being cultured. Patient did well on IV Rocephin.  His spirits improved.  His temperature dropped.  His white count came down to 10,000. 3. Constipation. Patient did have a large stool burden on x-ray.  He     was treated with a suppository and then a lactulose orally.  On     this regimen, he had an excellent response with several good bowel     movements and does reported he feels exceedingly better that he is     no longer full of it.    With no dilated bowel obstruction or other GI problem with identified source of infection with good response to antibiotics with his resolution of  the leukocytosis.  The patient is ready for discharge to complete an outpatient course of oral antibiotics.  DISCHARGE EXAMINATION:  VITAL SIGNS:  Temperature was 98.4, blood pressure was 149/80, heart rate 75, respirations 18, oxygen saturation 95% on room air. GENERAL APPEARANCE:  This is an overweight Caucasian gentleman in no acute distress. HEENT:  Conjunctivae and sclerae were clear.  Pupils equal, round, and reactive. NECK:  Supple. CHEST:  Clear with no rales, wheezes, or rhonchi. CARDIOVASCULAR:  A 2+ radial pulses.  Precordium was quiet.  He had a regular rate and rhythm without murmurs, rubs, or gallops. ABDOMEN:  Protuberant, soft with positive bowel sounds in all 4 quadrants.  No guarding or rebound was noted.  There was no tenderness to deep palpation. GENITALIA AND RECTAL:  Deferred. EXTREMITIES:  Without clubbing,  cyanosis, or edema. NEUROLOGIC:  The patient is awake, alert, oriented to person, place, time, and context.  FINAL LABORATORY:  From the day of discharge; sodium 139, potassium 3.4, chloride 101, CO2 of 27, BUN of 11, creatinine 0.61, glucose was 153. White count was 10,500; hemoglobin 11.9 g; platelet count 169,000.  DISCHARGE MEDICATIONS:  The patient will be resuming his home medications; 1. Acetaminophen 500 mg q.6 hours p.r.n. 2. Aspirin 81 mg daily. 3. B complex vitamins daily. 4. Coenzyme Q10 daily. 5. Proscar 5 mg once daily. 6. Lasix 40 mg daily. 7. Norco 5/325 every 6 hours as needed for pain. 8. Meloxicam 15 mg daily. 9. Metformin 1000 mg b.i.d. 10.Metoprolol tartrate 25 mg b.i.d. 11.Januvia 100 mg daily. 12.Flomax 0.4 mg daily. 13.Diovan HCT 160/12.5 mg daily.   NEW MEDICATIONS:  Ceftin 250 mg b.i.d. for 8 days.  FINAL DISPOSITION:  Patient is to be discharged to home.  He is instructed to call the office for any problems or complications.  Given that he is doing very well and is followed on a regular basis, no immediate follow up is scheduled, but he does know to call for problems. Patient's condition at the time of discharge dictation is stable and improved.     Rosalyn Gess Diamone Whistler, MD     MEN/MEDQ  D:  10/14/2012  T:  10/14/2012  Job:  [id number]

## 2012-10-14 NOTE — Progress Notes (Signed)
Pt d/c home w/wife. D/c instructions and medications reviewed with Pt. Pt states understanding. All Pt questions answered. 

## 2012-10-14 NOTE — Progress Notes (Signed)
The patient had a temp of 99.1 at 2100, but remained afebrile the rest of the night.  He did not have any complaints this morning other than a slight headache.

## 2012-10-17 LAB — URINE CULTURE: Colony Count: >=100000

## 2012-10-31 ENCOUNTER — Other Ambulatory Visit: Payer: Self-pay | Admitting: Internal Medicine

## 2012-11-03 ENCOUNTER — Other Ambulatory Visit: Payer: Self-pay | Admitting: Internal Medicine

## 2012-11-04 ENCOUNTER — Other Ambulatory Visit: Payer: Self-pay

## 2012-11-04 MED ORDER — MELOXICAM 15 MG PO TABS: 15.0000 mg | ORAL_TABLET | Freq: Every day | ORAL | Status: DC

## 2012-11-11 DEATH — deceased

## 2012-11-15 ENCOUNTER — Other Ambulatory Visit: Payer: Self-pay | Admitting: Internal Medicine

## 2012-11-16 ENCOUNTER — Encounter: Payer: Self-pay | Admitting: Internal Medicine

## 2012-11-16 ENCOUNTER — Ambulatory Visit (INDEPENDENT_AMBULATORY_CARE_PROVIDER_SITE_OTHER): Payer: Medicare Other | Admitting: Internal Medicine

## 2012-11-16 ENCOUNTER — Other Ambulatory Visit: Payer: Self-pay

## 2012-11-16 VITALS — BP 140/82 | HR 74 | Temp 97.5°F | Wt 172.2 lb

## 2012-11-16 DIAGNOSIS — E1149 Type 2 diabetes mellitus with other diabetic neurological complication: Secondary | ICD-10-CM

## 2012-11-16 DIAGNOSIS — M48061 Spinal stenosis, lumbar region without neurogenic claudication: Secondary | ICD-10-CM

## 2012-11-16 DIAGNOSIS — N12 Tubulo-interstitial nephritis, not specified as acute or chronic: Secondary | ICD-10-CM

## 2012-11-16 DIAGNOSIS — Z0000 Encounter for general adult medical examination without abnormal findings: Secondary | ICD-10-CM

## 2012-11-16 DIAGNOSIS — I10 Essential (primary) hypertension: Secondary | ICD-10-CM

## 2012-11-16 DIAGNOSIS — E119 Type 2 diabetes mellitus without complications: Secondary | ICD-10-CM

## 2012-11-16 DIAGNOSIS — E785 Hyperlipidemia, unspecified: Secondary | ICD-10-CM

## 2012-11-16 NOTE — Assessment & Plan Note (Signed)
Lab Results  Component Value Date   HGBA1C 6.4 09/28/2012   Good control on present medication

## 2012-11-16 NOTE — Assessment & Plan Note (Signed)
Discussed recent consultation with Dr. Danielle Dess: [name] finds his quality of life and activity satisfactory and does not wish to pursue surgical intervention at this time. If his symptoms get worse he is encouraged to revisit Dr. Danielle Dess.

## 2012-11-16 NOTE — Assessment & Plan Note (Signed)
Presented July 3, '14 with sepsis: fever to 102.8, CT c/w bilateral pyelo. Admitted for IV antibiotics and did well - home July 4, '14. Reviewed this hospitalization with him and explained what was done and why.  /(greater than 50% of 40 min visit spent on education and counseling)

## 2012-11-16 NOTE — Assessment & Plan Note (Signed)
taking and tolerating statin therapy. Last LDL June '14 - 99.  Plan No change in medication  Prudent diet.

## 2012-11-16 NOTE — Assessment & Plan Note (Signed)
BP Readings from Last 3 Encounters:  11/16/12 140/82  10/14/12 149/80  09/22/12 128/78   OK control.

## 2012-11-16 NOTE — Progress Notes (Signed)
Subjective:     Patient ID: [name], male   DOB: 07/08/1931, 79 y.o.   MRN: [id number]  HPI  Recently spent two days in the hospital for a UTI four weeks ago.  The patient is here for annual Medicare wellness examination and management of other chronic and acute problems.   The risk factors are reflected in the social history.  The roster of all physicians providing medical care to patient - is listed in the Snapshot section of the chart.  Activities of daily living:  The patient is 100% inedpendent in all ADLs: dressing, toileting, feeding as well as independent mobility  Home safety : The patient has smoke detectors in the home. They wear seatbelts.  Firearms are present in the home, kept in a safe fashion. There is no violence in the home.   There is low risk for hepatitis, STDs or HIV. He has a history of blood transfusions - 40-50 years ago. They have no travel history to infectious disease endemic areas of the world.  The patient has not seen their dentist in the last six month. They have seen their eye doctor in the last year. They admit to any hearing difficulty and have not had audiologic testing in the last year.  They do not have excessive sun exposure. Discussed the need for sun protection: hats, long sleeves and use of sunscreen if there is significant sun exposure.   Diet: the importance of a healthy diet is discussed. They do have a healthy diet.  The patient does not have a regular exercise program.  The benefits of regular aerobic exercise were discussed.  Depression screen: there are no signs or vegative symptoms of depression- irritability, change in appetite, anhedonia, sadness/tearfullness.  Cognitive assessment: the patient manages all their financial and personal affairs and is actively engaged. He was concerned about his memory but could relate day,date,year and events; recalled 3/3 objects at 3 minutes; performed clock-face test normally.  Concerned for  Alzheizmer's disease since he doesn't want to "be a burden on his wife".  No fhx of dementia.  Slight difficulty discerning dreams from reality.   The following portions of the patient's history were reviewed and updated as appropriate: allergies, current medications, past family history, past medical history,  past surgical history, past social history  and problem list.  Vision, hearing, body mass index were assessed and reviewed.   During the course of the visit the patient was educated and counseled about appropriate screening and preventive services including : fall prevention , diabetes screening, nutrition counseling, colorectal cancer screening, and recommended immunizations.  Did not counsel regarding immunizations (varicella only one?)  Current Outpatient Prescriptions on File Prior to Visit  Medication Sig Dispense Refill   acetaminophen (TYLENOL) 500 MG tablet Take 500 mg by mouth as needed for fever (fever).        aspirin 81 MG EC tablet Take 81 mg by mouth daily.        atorvastatin (LIPITOR) 80 MG tablet TAKE 1 TABLET BY MOUTH DAILY.  90 tablet  3   b complex vitamins tablet Take 1 tablet by mouth daily.       Coenzyme Q10 (CO Q 10 PO) Take 1 capsule by mouth daily.        finasteride (PROSCAR) 5 MG tablet Take 1 tablet (5 mg total) by mouth daily.  90 tablet  3   FREESTYLE LITE test strip USE AS INSTRUCTED DAILY TO TEST BLOOD GLUCOSE. DX: 250.00  100  each  3   furosemide (LASIX) 40 MG tablet Take 1 tablet (40 mg total) by mouth daily.  90 tablet  3   Glucose Blood (FREESTYLE LITE TEST VI)        HYDROcodone-acetaminophen (NORCO/VICODIN) 5-325 MG per tablet Take 1 tablet by mouth every 6 (six) hours as needed for pain.  90 tablet  1   meloxicam (MOBIC) 15 MG tablet Take 1 tablet (15 mg total) by mouth daily.  90 tablet  0   metFORMIN (GLUCOPHAGE) 1000 MG tablet Take 1 tablet (1,000 mg total) by mouth 2 (two) times daily with a meal.  180 tablet  3   metoprolol  tartrate (LOPRESSOR) 25 MG tablet Take 1 tablet (25 mg total) by mouth 2 (two) times daily.  180 tablet  3   sitaGLIPtin (JANUVIA) 100 MG tablet Take 100 mg by mouth daily.       tamsulosin (FLOMAX) 0.4 MG CAPS Take 1 capsule (0.4 mg total) by mouth daily.  90 capsule  3   valsartan-hydrochlorothiazide (DIOVAN HCT) 160-12.5 MG per tablet Take 1 tablet by mouth daily.  90 tablet  3   No current facility-administered medications on file prior to visit.   History   Social History   Marital Status: Married    Spouse Name: N/A    Number of Children: N/A   Years of Education: N/A   Occupational History   truck Forensic scientist for [employer]    Social History Main Topics   Smoking status: Former Smoker    Quit date: 04/14/1983   Smokeless tobacco: Never Used     Comment: stopped 1985   Alcohol Use: Yes   Drug Use: No   Sexually Active: Not on file   Other Topics Concern   Not on file   Social History Narrative   Married in [place] - 58yrs, divorced; married 1974 - 1.5 years, divorced; married 1985 1 daughter 67; 53 step daughters; 2 grandchildren.     Review of Systems  HENT: Negative for hearing loss, ear pain and tinnitus.   Eyes: Positive for visual disturbance. Negative for photophobia and pain.       Occasional ocular migraines.  Had a dark spot in vision recently.       Objective:   Physical Exam  Constitutional: He appears well-developed and well-nourished. No distress.  HENT:  Head: Normocephalic and atraumatic.  Right Ear: External ear normal.  Left Ear: External ear normal.  Nose: Nose normal.  Mouth/Throat: No oropharyngeal exudate.  Telangiectasia on left cheek  Eyes: Conjunctivae and EOM are normal. Pupils are equal, round, and reactive to light. Right eye exhibits no discharge. Left eye exhibits no discharge.  Neck: No JVD present.  Cardiovascular: Normal rate, regular rhythm and normal heart sounds.  Exam reveals no gallop and no  friction rub.   No murmur heard. Pulmonary/Chest: Effort normal and breath sounds normal. No respiratory distress. He has no wheezes.  Abdominal: Bowel sounds are normal. There is no rebound and no guarding.  Musculoskeletal: He exhibits no edema and no tenderness.  Slight crepitance in both knees.  Skin: He is not diaphoretic.       Assessment/Plan;     1. [name] is a healthy 79 year-old man here for a medicare well-check.  The assessment suggests that he is at risk for hepatitis due to his history of a blood transfusion 40-50 years ago.  He is thus at risk for subclinical hepatitis B or C infection  and HBV Ag and HCV Ag should be assessed if he presents with hepatic symptoms.  He was concerned about his memory though the MiniCog test revealed no deficits.  2. Hypertension: [name]'s hypertension is poorly managed despite continued medications.  He was counseled regarding the importance of diet and exercise.  3. Diabetes: [name]'s diabetes is currently well-managed and his A1C is at goal.     Plan:

## 2012-11-16 NOTE — Assessment & Plan Note (Signed)
Peripheral neuropathy.  Lab Results  Component Value Date   HGBA1C 6.4 09/28/2012   Very good control.

## 2012-11-21 ENCOUNTER — Encounter: Payer: Medicare Other | Admitting: Internal Medicine

## 2012-11-29 ENCOUNTER — Other Ambulatory Visit: Payer: Self-pay

## 2012-11-29 MED ORDER — HYDROCODONE-ACETAMINOPHEN 5-325 MG PO TABS: 1.0000 | ORAL_TABLET | Freq: Four times a day (QID) | ORAL | Status: DC | PRN

## 2012-11-30 NOTE — Telephone Encounter (Signed)
Hydrocodone called to pharmacy  

## 2013-01-26 ENCOUNTER — Ambulatory Visit (INDEPENDENT_AMBULATORY_CARE_PROVIDER_SITE_OTHER): Payer: Medicare Other | Admitting: Internal Medicine

## 2013-01-26 ENCOUNTER — Encounter: Payer: Self-pay | Admitting: Internal Medicine

## 2013-01-26 ENCOUNTER — Other Ambulatory Visit (INDEPENDENT_AMBULATORY_CARE_PROVIDER_SITE_OTHER): Payer: Medicare Other

## 2013-01-26 VITALS — BP 148/86 | HR 72 | Temp 97.7°F | Wt 175.4 lb

## 2013-01-26 DIAGNOSIS — E785 Hyperlipidemia, unspecified: Secondary | ICD-10-CM

## 2013-01-26 DIAGNOSIS — R252 Cramp and spasm: Secondary | ICD-10-CM

## 2013-01-26 DIAGNOSIS — Z23 Encounter for immunization: Secondary | ICD-10-CM

## 2013-01-26 DIAGNOSIS — M48061 Spinal stenosis, lumbar region without neurogenic claudication: Secondary | ICD-10-CM

## 2013-01-26 LAB — SEDIMENTATION RATE: Sed Rate: 9 mm/hr (ref 0–22)

## 2013-01-26 NOTE — Progress Notes (Signed)
Subjective:     Patient ID: [name], male   DOB: 1932-03-17, 79 y.o.   MRN: [id number]  HPI [name] is a 79 year old man with a history of HTN, hyperlipidemia, CAD, DM with neuropathy, carpal tunnel syndrome, OA, plantar fasciitis, lumbar spinal stenosis, chronic lower back pain, nocturnal leg cramps who presents for evaluation of a cramps in his legs.  He has a long history of bilateral leg cramps, but recently has developed an ache from his hip all the way to his ankle in his left > than his right leg. This feels like a dull ache worst in the thigh and calf. He notices it at rest, and it hurts to straighten his leg and to walk. This pain is worse at night and prevents him from sleeping. He has been taking meloxicam and hydrocodone, which don't help with the pain. He is wiling to stop the hydrocodone since he can't feel when he takes it.   At times he has a burning sensation behind his knee, as well as one episode of severe 20/10 muscle spasm on the left. He also feels some muscle twitches or fasiculations in both thighs. He also feels some cramps in his hands and legs.  In regards to fall risk, he feels like he is getting weaker. He has some trouble with steps, and feels off-balance at times. He has not fallen in the past. There are stairs in his house. He does have grab bars in the shower. He has a cane, but doesn't like to use it.   He is seeing Dr. Danielle Dess, a neurosurgeon 3-4 months ago, and reports that the surgeon was unsure if this surgery to correct lumbar stenosis would help with the urinary and bowel incontinence, but may help for pain. The surgeon recommends the surgery, but [name] is concerned about spending time in the hospital and the long recovery. He does get epidurals in his spine, but has not have one in a year. The last one he received didn't help. He has also been on gabapentin up to 1800 mg daily which didn't help at all. He has also tried tonic water and ginko biloba with no  effect. He has had electrolytes checked twice in the past with no abnormal findings. Normal B12 and TSH in the past year.   Vaccinations:  Flu shot was almost 3 weeks ago.  Last pnemovax was in 1995. Has not received Prevnar.   Past Medical History  Diagnosis Date  . HERPES SIMPLEX INFECTION 01/19/2007  . TINEA PEDIS 11/15/2009  . DIABETES MELLITUS 01/19/2007  . HYPERLIPIDEMIA 01/19/2007  . DEPRESSION, CHRONIC 06/24/2007  . OBSTRUCTIVE SLEEP APNEA 01/19/2007  . Carpal tunnel syndrome 03/24/2007  . HYPERTENSION 01/19/2007  . Intermediate coronary syndrome 08/29/2008  . CORONARY ARTERY DISEASE 01/19/2007  . URI 01/11/2009  . OSTEOARTHRITIS 06/24/2007  . SHOULDER PAIN, LEFT 03/24/2007  . SPINAL STENOSIS, LUMBAR 06/03/2009  . BACK PAIN, CHRONIC 04/18/2009  . PLANTAR FASCIITIS, RIGHT 01/19/2007  . LEG CRAMPS, NOCTURNAL 10/28/2007  . HEART MURMUR, SYSTOLIC 01/02/2008  . Diarrhea 02/23/2007  . BENIGN PROSTATIC HYPERTROPHY, HX OF 01/19/2007  . PERCUTANEOUS TRANSLUMINAL CORONARY ANGIOPLASTY, HX OF 01/19/2007  . Other postprocedural status(V45.89) 01/19/2007  . DIABETES MELLITUS, TYPE II, WITH NEUROLOGICAL COMPLICATIONS 06/17/2010  . Leg cramps, sleep related 08/05/2010   Past Surgical History  Procedure Laterality Date  . Arthroscopy, knee of hx    . Plastic joint in thumb    . Inguinal herniorrhapy, hx    .  Percutaneous transluminal coronary angioplasty, hx of  2004    Dr. Chales Abrahams  . Ptca/stent  2004   Family History  Problem Relation Age of Onset  . Diabetes Other   . Coronary artery disease Other    History   Social History  . Marital Status: Married    Spouse Name: N/A    Number of Children: N/A  . Years of Education: N/A   Occupational History  . 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 Smoking status: Former Smoker    Quit date: 04/14/1983  . Smokeless tobacco: Never Used     Comment: stopped 1985  . Alcohol Use: Yes  . Drug Use: No  . Sexual  Activity: Not on file   Other Topics Concern  . Not on file   Social History Narrative   Married in 1959 - 23yrs, divorced; married 1974 - 1.5 years, divorced; married 1985 1 daughter 67; 24 step daughters; 2 grandchildren.      Review of Systems General: No fevers, chills, or night sweats.  CV: No chest pain. Some DOE, no change from baseline.  Lungs: No chest tightness or wheezing.  Abdominal: Little diarrhea--1 normal and 1 liquid stool. Took an anti-diarrheal.  Urologic: Continued urinary frequency and urgency, is getting worse. Interested in increasing his flomax and his finasteride.    Objective:   Physical Exam Filed Vitals:   01/26/13 1313  BP: 148/86  Pulse: 72  Temp: 97.7 F (36.5 C)   General: Sitting in exam room in NAD.  CV: RRR. 2+ radial and DP pulses.  Pulm: CTAB. MSK: Some pain along posterior thigh on hip flexion with leg straight--with decreased range of motion. No pain on hip flexion with leg bent. No pain on internal or external rotation. Some tenderness to palpation over the left greater trochanter.  Neuro:  -Symmetric loss of vibratory sensation over both feet with some loss of pinprick sensation on the plantar surface of big toe. Sensation to pinprick on the plantar ball of feet is intact.  -Strength: Can rise from chair without using hands to assist easily.  -Balance: can hold partial tandem stance for 10s, but not full tandem stance.  -Gait: Gait was normal, can turn with a pivot.   Current Outpatient Prescriptions on File Prior to Visit  Medication Sig Dispense Refill  . acetaminophen (TYLENOL) 500 MG tablet Take 500 mg by mouth as needed for fever (fever).       Marland Kitchen aspirin 81 MG EC tablet Take 81 mg by mouth daily.       Marland Kitchen b complex vitamins tablet Take 1 tablet by mouth daily.      . finasteride (PROSCAR) 5 MG tablet Take 1 tablet (5 mg total) by mouth daily.  90 tablet  3  . FREESTYLE LITE test strip USE AS INSTRUCTED DAILY TO TEST BLOOD  GLUCOSE. DX: 250.00  100 each  3  . furosemide (LASIX) 40 MG tablet Take 1 tablet (40 mg total) by mouth daily.  90 tablet  3  . Glucose Blood (FREESTYLE LITE TEST VI)       . HYDROcodone-acetaminophen (NORCO/VICODIN) 5-325 MG per tablet Take 1 tablet by mouth every 6 (six) hours as needed for pain.  90 tablet  1  . Magnesium 400 MG CAPS Take by mouth daily.      . meloxicam (MOBIC) 15 MG tablet Take 1 tablet (15 mg total) by mouth daily.  90 tablet  0  . metFORMIN (GLUCOPHAGE) 1000 MG tablet Take 1 tablet (1,000 mg total) by mouth 2 (two) times daily with a meal.  180 tablet  3  . metoprolol tartrate (LOPRESSOR) 25 MG tablet Take 1 tablet (25 mg total) by mouth 2 (two) times daily.  180 tablet  3  . sitaGLIPtin (JANUVIA) 100 MG tablet Take 100 mg by mouth daily.      . tamsulosin (FLOMAX) 0.4 MG CAPS Take 1 capsule (0.4 mg total) by mouth daily.  90 capsule  3  . valsartan-hydrochlorothiazide (DIOVAN HCT) 160-12.5 MG per tablet Take 1 tablet by mouth daily.  90 tablet  3   No current facility-administered medications on file prior to visit.      Assessment:     [name] is a 79 year old man with a history of spinal stenosis and nocturnal leg cramps who presents with continued muscle cramps, worst in his thigh and calf despite treatment with meloxicam and vicodin.     Plan:     See plan by problem list.

## 2013-01-26 NOTE — Patient Instructions (Signed)
For your muscle cramps:  We will check a few labs today: the creatinine kinase and an ESR, a measure of general inflammation.   For the pain:  Consider trying cinchona. You can purchase this at [facility] or a whole food store. This is similar to quinine in effect.   We also gave you Prevnar today to help prevent community acquired pneumonia. You can also get the booster of the Pneumovax vaccine in a month or two. Return for a nursing appointment to receive the Pneumovax.

## 2013-01-26 NOTE — Assessment & Plan Note (Addendum)
May consider association with atorvastatin vs. referred pain from hip OA vs. neuropathic pain due to spinal stenosis vs. much less likely to be an autoinflammatory myositis or polymyalgia rheumatica. Lack of response to gabapentin makes neuropathic pain due to spinal stenosis less likely. Recent NL TSH and B12. This is most likely related to his spinal stenosis.   Plan:  Will check CK to r/o atorvastatin related myositis. Will check ESR to screen for autoinflammatory conditions. Also advised patient to return for follow-up with the neurosurgeon to talk about possible surgical correction.

## 2013-01-29 NOTE — Assessment & Plan Note (Signed)
Progressive symptoms of pain, loss of balance and increasing weakness c/w progressive spinal stenosis.  Plan Recommended [name] reconsider his decision in regard to surgical decompression Lumbar spine. He will call for appointment with Dr. Danielle Dess.

## 2013-01-31 ENCOUNTER — Encounter: Payer: Self-pay | Admitting: Internal Medicine

## 2013-02-03 ENCOUNTER — Other Ambulatory Visit: Payer: Self-pay | Admitting: Internal Medicine

## 2013-02-03 MED ORDER — MELOXICAM 15 MG PO TABS: 15.0000 mg | ORAL_TABLET | Freq: Every day | ORAL | Status: DC

## 2013-02-03 NOTE — Telephone Encounter (Signed)
Mobic refilled.

## 2013-02-06 ENCOUNTER — Telehealth: Payer: Self-pay | Admitting: Internal Medicine

## 2013-02-06 MED ORDER — FREESTYLE FREEDOM LITE W/DEVICE KIT: PACK | Status: DC

## 2013-02-06 NOTE — Telephone Encounter (Signed)
Patients wife if requesting a diabetes test kit for her husband.  The one he had broke.

## 2013-02-06 NOTE — Telephone Encounter (Signed)
This has been sent to pharmacy.

## 2013-02-09 ENCOUNTER — Other Ambulatory Visit: Payer: Self-pay | Admitting: Internal Medicine

## 2013-02-14 ENCOUNTER — Other Ambulatory Visit: Payer: Self-pay | Admitting: Internal Medicine

## 2013-02-27 ENCOUNTER — Other Ambulatory Visit: Payer: Self-pay | Admitting: Internal Medicine

## 2013-03-27 ENCOUNTER — Other Ambulatory Visit: Payer: Self-pay | Admitting: Internal Medicine

## 2013-04-14 ENCOUNTER — Ambulatory Visit (INDEPENDENT_AMBULATORY_CARE_PROVIDER_SITE_OTHER): Payer: Medicare Other | Admitting: Internal Medicine

## 2013-04-14 ENCOUNTER — Ambulatory Visit (INDEPENDENT_AMBULATORY_CARE_PROVIDER_SITE_OTHER)
Admission: RE | Admit: 2013-04-14 | Discharge: 2013-04-14 | Disposition: A | Discharge status: Home / Self Care | Payer: Medicare Other | Source: Ambulatory Visit | Attending: Internal Medicine | Admitting: Internal Medicine

## 2013-04-14 ENCOUNTER — Encounter: Payer: Self-pay | Admitting: Internal Medicine

## 2013-04-14 VITALS — BP 140/86 | HR 64 | Temp 98.8°F | Wt 174.0 lb

## 2013-04-14 DIAGNOSIS — E119 Type 2 diabetes mellitus without complications: Secondary | ICD-10-CM

## 2013-04-14 DIAGNOSIS — M542 Cervicalgia: Secondary | ICD-10-CM

## 2013-04-14 DIAGNOSIS — I10 Essential (primary) hypertension: Secondary | ICD-10-CM

## 2013-04-14 DIAGNOSIS — M1990 Unspecified osteoarthritis, unspecified site: Secondary | ICD-10-CM

## 2013-04-14 DIAGNOSIS — M539 Dorsopathy, unspecified: Secondary | ICD-10-CM

## 2013-04-14 DIAGNOSIS — M489 Spondylopathy, unspecified: Secondary | ICD-10-CM

## 2013-04-14 DIAGNOSIS — M79601 Pain in right arm: Secondary | ICD-10-CM

## 2013-04-14 DIAGNOSIS — M79602 Pain in left arm: Secondary | ICD-10-CM

## 2013-04-14 DIAGNOSIS — M79609 Pain in unspecified limb: Secondary | ICD-10-CM

## 2013-04-14 DIAGNOSIS — M48061 Spinal stenosis, lumbar region without neurogenic claudication: Secondary | ICD-10-CM

## 2013-04-14 MED ORDER — DIAZEPAM 2 MG PO TABS: 2.0000 mg | ORAL_TABLET | Freq: Every day | ORAL | Status: DC

## 2013-04-14 NOTE — Progress Notes (Signed)
Pre visit review using our clinic review tool, if applicable. No additional management support is needed unless otherwise documented below in the visit note. 

## 2013-04-14 NOTE — Progress Notes (Signed)
Subjective:    Patient ID: [name], male    DOB: 03/08/1932, 80 y.o.   MRN: [id number]  HPI [name] has continuing and progressive pain in the left proximal LE now extending to the knee. He does have limitations in regard to walking. The discomfort also bothers him when supine. Leg cramps continue to be a problem. He has not had frank weakness or paresthesia. Reviewed [name] last not from July '14: he does feel that single level surgery with fixation and fusion may help with the focal spinal stenosis seen on various studies. Although [name] has diabetes, that is controlled, and a history of CAD he is a very reasonable candidate for surgery.  [name] c/o pain in the left arm and new onset arm cramps. He denies any focal weakness, paresthesia, swelling about the elbow or shoulder. He has not had any injury or strain. The pain the right arm is positional: greatest pain with raising the arm over his head. There are no previous c-spine xrays.  [name] reports his other medical problems are controlled. He does have bouts of depression and states his unhappiness about the creeping decrepitude associated with these MSK related problems.  PMH, FamHx and SocHx reviewed for any changes and relevance.  Current Outpatient Prescriptions on File Prior to Visit  Medication Sig Dispense Refill  . acetaminophen (TYLENOL) 500 MG tablet Take 500 mg by mouth as needed for fever (fever).       Marland Kitchen aspirin 81 MG EC tablet Take 81 mg by mouth daily.       Marland Kitchen b complex vitamins tablet Take 1 tablet by mouth daily.      . Blood Glucose Monitoring Suppl (FREESTYLE FREEDOM LITE) W/DEVICE KIT Use to test BS every other day  1 each  0  . finasteride (PROSCAR) 5 MG tablet TAKE 1 TABLET BY MOUTH EVERY DAY  30 tablet  5  . FREESTYLE LITE test strip USE AS INSTRUCTED DAILY TO TEST BLOOD GLUCOSE. DX: 250.00  100 each  3  . furosemide (LASIX) 40 MG tablet Take 1 tablet (40 mg total) by mouth daily.  90 tablet  3    . Glucose Blood (FREESTYLE LITE TEST VI)       . HYDROcodone-acetaminophen (NORCO/VICODIN) 5-325 MG per tablet Take 1 tablet by mouth every 6 (six) hours as needed for pain.  90 tablet  1  . JANUVIA 100 MG tablet TAKE 1 TABLET BY MOUTH ONCE DAILY  30 tablet  11  . Magnesium 400 MG CAPS Take by mouth daily.      . meloxicam (MOBIC) 15 MG tablet Take 1 tablet (15 mg total) by mouth daily.  90 tablet  0  . metFORMIN (GLUCOPHAGE) 1000 MG tablet Take 1 tablet (1,000 mg total) by mouth 2 (two) times daily with a meal.  180 tablet  3  . metFORMIN (GLUCOPHAGE) 1000 MG tablet TAKE 1 TABLET BY MOUTH TWICE A DAY WITH A MEAL  180 tablet  3  . metoprolol tartrate (LOPRESSOR) 25 MG tablet TAKE 1 TABLET BY MOUTH TWICE A DAY  60 tablet  5  . tamsulosin (FLOMAX) 0.4 MG CAPS capsule TAKE 1 CAPSULE BY MOUTH DAILY  90 capsule  3  . valsartan-hydrochlorothiazide (DIOVAN HCT) 160-12.5 MG per tablet Take 1 tablet by mouth daily.  90 tablet  3   No current facility-administered medications on file prior to visit.      Review of Systems System review is negative  for any constitutional, cardiac, pulmonary, GI or neuro symptoms or complaints other than as described in the HPI.      Objective:   Physical Exam Filed Vitals:   04/14/13 1127  BP: 140/86  Pulse: 64  Temp: 98.8 F (37.1 C)   Wt Readings from Last 3 Encounters:  04/14/13 174 lb (78.926 kg)  01/26/13 175 lb 6.4 oz (79.561 kg)  11/16/12 172 lb 3.2 oz (78.109 kg)   BP Readings from Last 3 Encounters:  04/14/13 140/86  01/26/13 148/86  11/16/12 140/82   Gen'l - WNWD man in no acute distress HEENT_ C&S clear Cor - RRR Pulm - normal respirations MSK - no swelling or tenderness at elbows, shoulders or left knee. Normal active ROM Neuro - nl strength and sensation RUE. Normal gait and balance.       Assessment & Plan:

## 2013-04-14 NOTE — Patient Instructions (Signed)
1. Spinal stenosis - Dr. Ellene Route does feel that he can help you with surgery. Your heart failure is under good control and is not a factor; diabetes - has been well controlled. You should consider surgery.  2. Arm pain - may be coming from cervical spine issues: disk disease or spondylosis, similar to your back.  Plan Cervical spine xrays  3. Urination and associated problems - may be overactive bladder - this was what Dr. Karsten Ro thought Plan Trial of Myrbetriq - a medication for OAB  4. Cramps -  May be a question of muscle tension since all else has failed Plan Low dose diazepam (valium) at bedtime.

## 2013-04-16 DIAGNOSIS — M489 Spondylopathy, unspecified: Secondary | ICD-10-CM | POA: Insufficient documentation

## 2013-04-16 NOTE — Assessment & Plan Note (Signed)
Lab Results  Component Value Date   HGBA1C 6.4 09/28/2012   Good control.

## 2013-04-16 NOTE — Assessment & Plan Note (Signed)
New arm pain and cramping w/o joint disease elbow or shoulder on exam with very positive c-spine series. He does not have profound radicular findings.  Plan May benefit from MRI c-spine to better delineate the problem although no intervention is contemplated at this time.

## 2013-04-16 NOTE — Assessment & Plan Note (Signed)
History of left knee pain and of arthroscopy. On exam today the left knee is w/o effusion, ROM is well preserved, negative drawer sign, no medial or lateral instability, no crepitus. Although there is a h/o DJD there is no evidence of current flare of this problem as proximal cause of his knee pain.

## 2013-04-16 NOTE — Assessment & Plan Note (Signed)
BP Readings from Last 3 Encounters:  04/14/13 140/86  01/26/13 148/86  11/16/12 140/82   Adequate control of BP on current medications (JNC 8) but suboptimal in light of DM and CAD  Plan Continue present medications.

## 2013-04-16 NOTE — Assessment & Plan Note (Signed)
Progressive leg pain, left mostly, and cramping. Reviewed with him Dr. Clarice Pole last note in particular to inform him that he is a reasonable candidate for surgery even in light of his diabetes and heart disease.  Plan He will give the surgical option more consideration.  No change in medications at this time.

## 2013-04-17 ENCOUNTER — Other Ambulatory Visit: Payer: Self-pay | Admitting: Neurological Surgery

## 2013-04-24 NOTE — Pre-Procedure Instructions (Signed)
[name]  04/24/2013   Your procedure is scheduled on: Tuesday, Jan. 20th   Report to [facility]  2 * 3  at  5:30 AM.  Call this number if you have problems the morning of surgery: [phone]   Remember:   Do not eat food or drink liquids after midnight Monday.   Take these medicines the morning of surgery with A SIP OF WATER: Metoprolol, flomax, pain medication.              STOP TAKING ANY HERBAL MEDICATIONS, ANTI-INFLAMMATORIES 4-5 DAYS PRIOR TO SURGERY.   Do not wear jewelry, make-up or nail polish.  Do not wear lotions, powders, or colognes. You may wear deodorant.   Men may shave face and neck.   Do not bring valuables to the hospital.  [facility] is not responsible for any belongings or valuables.               Contacts, dentures or bridgework may not be worn into surgery.  Leave suitcase in the car. After surgery it may be brought to your room.  For patients admitted to the hospital, discharge time is determined by your treatment team.    Name and phone number of your driver:    Special Instructions: Shower using CHG 2 nights before surgery and the night before surgery.  If you shower the day of surgery use CHG.  Use special wash - you have one bottle of CHG for all showers.  You should use approximately 1/3 of the bottle for each shower.   Please read over the following fact sheets that you were given: Pain Booklet, Blood Transfusion Information, MRSA Information and Surgical Site Infection Prevention

## 2013-04-25 ENCOUNTER — Encounter (HOSPITAL_COMMUNITY)
Admission: RE | Admit: 2013-04-25 | Discharge: 2013-04-25 | Disposition: A | Discharge status: Home / Self Care | Payer: Medicare Other | Source: Ambulatory Visit | Attending: Neurological Surgery | Admitting: Neurological Surgery

## 2013-04-25 ENCOUNTER — Encounter (HOSPITAL_COMMUNITY): Payer: Self-pay

## 2013-04-25 DIAGNOSIS — Z01811 Encounter for preprocedural respiratory examination: Secondary | ICD-10-CM | POA: Insufficient documentation

## 2013-04-25 DIAGNOSIS — Z01818 Encounter for other preprocedural examination: Secondary | ICD-10-CM | POA: Insufficient documentation

## 2013-04-25 DIAGNOSIS — Z01812 Encounter for preprocedural laboratory examination: Secondary | ICD-10-CM | POA: Insufficient documentation

## 2013-04-25 DIAGNOSIS — Z01810 Encounter for preprocedural cardiovascular examination: Secondary | ICD-10-CM | POA: Insufficient documentation

## 2013-04-25 LAB — CBC
HCT: 42.1 % (ref 39.0–52.0)
Hemoglobin: 14.8 g/dL (ref 13.0–17.0)
MCH: 31.3 pg (ref 26.0–34.0)
MCHC: 35.2 g/dL (ref 30.0–36.0)
MCV: 89 fL (ref 78.0–100.0)
Platelets: 175 10*3/uL (ref 150–400)
RBC: 4.73 MIL/uL (ref 4.22–5.81)
RDW: 12.9 % (ref 11.5–15.5)
WBC: 6.5 10*3/uL (ref 4.0–10.5)

## 2013-04-25 LAB — BASIC METABOLIC PANEL
BUN: 21 mg/dL (ref 6–23)
CHLORIDE: 103 meq/L (ref 96–112)
CO2: 24 meq/L (ref 19–32)
Calcium: 9.3 mg/dL (ref 8.4–10.5)
Creatinine, Ser: 0.69 mg/dL (ref 0.50–1.35)
GFR calc Af Amer: >90 mL/min (ref >90)
GFR, EST NON AFRICAN AMERICAN: 88 mL/min — AB (ref >90)
GLUCOSE: 159 mg/dL — AB (ref 70–99)
POTASSIUM: 4.9 meq/L (ref 3.7–5.3)
SODIUM: 142 meq/L (ref 137–147)

## 2013-04-25 LAB — SURGICAL PCR SCREEN
MRSA, PCR: NEGATIVE
Staphylococcus aureus: POSITIVE — AB

## 2013-04-25 NOTE — Progress Notes (Signed)
Blood bank personnel called and stated that antibodies were found in pt's blood and pt would need to have another type and screen drawn day of surgery with same blood bank bracelet #. Order put in [facility]

## 2013-04-25 NOTE — Pre-Procedure Instructions (Signed)
[name]  04/25/2013   Your procedure is scheduled on: Tuesday, Jan. 20, 2015 at 7:30 AM.   Report to [facility] Entrance "A" Admitting Office  at  5:30 AM.   Call this number if you have problems the morning of surgery: [phone]   Remember:   Do not eat food or drink liquids after midnight Monday 05/01/13.   Take these medicines the morning of surgery with A SIP OF WATER: Metoprolol, flomax, pain medication.              STOP TAKING ANY HERBAL MEDICATIONS, ANTI-INFLAMMATORIES 4-5 DAYS PRIOR TO SURGERY.   Do not wear jewelry.  Do not wear lotions, powders, or cologne. You may wear deodorant.             Men may shave face and neck.   Do not bring valuables to the hospital.  [facility] is not responsible for any belongings or valuables.               Contacts, dentures or bridgework may not be worn into surgery.  Leave suitcase in the car. After surgery it may be brought to your room.  For patients admitted to the hospital, discharge time is determined by your treatment team.       Special Instructions: Shower using CHG 2 nights before surgery and the night before surgery.  If you shower the day of surgery use CHG.  Use special wash - you have one bottle of CHG for all showers.  You should use approximately 1/3 of the bottle for each shower.   Please read over the following fact sheets that you were given: Pain Booklet, Blood Transfusion Information, MRSA Information and Surgical Site Infection Prevention

## 2013-04-27 ENCOUNTER — Encounter: Payer: Self-pay | Admitting: Internal Medicine

## 2013-05-01 MED ORDER — CEFAZOLIN SODIUM-DEXTROSE 2-3 GM-% IV SOLR
2.0000 g | INTRAVENOUS | Status: AC
  Administered 2013-05-02: 2 g via INTRAVENOUS
  Filled 2013-05-01: qty 50

## 2013-05-02 ENCOUNTER — Inpatient Hospital Stay (HOSPITAL_COMMUNITY): Payer: Medicare Other | Admitting: Critical Care Medicine

## 2013-05-02 ENCOUNTER — Encounter (HOSPITAL_COMMUNITY)
Admission: RE | Disposition: A | Discharge status: Home / Self Care | Payer: Medicare Other | Source: Ambulatory Visit | Attending: Neurological Surgery

## 2013-05-02 ENCOUNTER — Inpatient Hospital Stay (HOSPITAL_COMMUNITY)
Admission: RE | Admit: 2013-05-02 | Discharge: 2013-05-10 | DRG: 460 | Disposition: A | Discharge status: Home / Self Care | Payer: Medicare Other | Source: Ambulatory Visit | Attending: Neurological Surgery | Admitting: Neurological Surgery

## 2013-05-02 ENCOUNTER — Encounter (HOSPITAL_COMMUNITY): Payer: Self-pay | Admitting: Anesthesiology

## 2013-05-02 ENCOUNTER — Encounter (HOSPITAL_COMMUNITY): Payer: Medicare Other | Admitting: Critical Care Medicine

## 2013-05-02 ENCOUNTER — Inpatient Hospital Stay (HOSPITAL_COMMUNITY): Payer: Medicare Other

## 2013-05-02 DIAGNOSIS — E119 Type 2 diabetes mellitus without complications: Secondary | ICD-10-CM | POA: Diagnosis present

## 2013-05-02 DIAGNOSIS — N390 Urinary tract infection, site not specified: Secondary | ICD-10-CM | POA: Diagnosis present

## 2013-05-02 DIAGNOSIS — M48062 Spinal stenosis, lumbar region with neurogenic claudication: Secondary | ICD-10-CM | POA: Diagnosis present

## 2013-05-02 DIAGNOSIS — M4316 Spondylolisthesis, lumbar region: Secondary | ICD-10-CM | POA: Diagnosis present

## 2013-05-02 DIAGNOSIS — I509 Heart failure, unspecified: Secondary | ICD-10-CM | POA: Diagnosis present

## 2013-05-02 DIAGNOSIS — G834 Cauda equina syndrome: Secondary | ICD-10-CM | POA: Diagnosis present

## 2013-05-02 DIAGNOSIS — M4310 Spondylolisthesis, site unspecified: Principal | ICD-10-CM | POA: Diagnosis present

## 2013-05-02 DIAGNOSIS — R339 Retention of urine, unspecified: Secondary | ICD-10-CM | POA: Diagnosis not present

## 2013-05-02 LAB — GLUCOSE, CAPILLARY
GLUCOSE-CAPILLARY: 175 mg/dL — AB (ref 70–99)
Glucose-Capillary: 173 mg/dL — ABNORMAL HIGH (ref 70–99)
Glucose-Capillary: 173 mg/dL — ABNORMAL HIGH (ref 70–99)
Glucose-Capillary: 161 mg/dL — ABNORMAL HIGH (ref 70–99)

## 2013-05-02 SURGERY — POSTERIOR LUMBAR FUSION 1 LEVEL
Anesthesia: General | Site: Back

## 2013-05-02 MED ORDER — KETOROLAC TROMETHAMINE 15 MG/ML IJ SOLN
15.0000 mg | Freq: Four times a day (QID) | INTRAMUSCULAR | Status: AC
  Administered 2013-05-02 – 2013-05-03 (×5): 15 mg via INTRAVENOUS
  Filled 2013-05-02 (×5): qty 1

## 2013-05-02 MED ORDER — LIDOCAINE-EPINEPHRINE 1 %-1:100000 IJ SOLN
INTRAMUSCULAR | Status: DC | PRN
  Administered 2013-05-02: 9 mL

## 2013-05-02 MED ORDER — METOCLOPRAMIDE HCL 5 MG/ML IJ SOLN: 10.0000 mg | Freq: Once | INTRAMUSCULAR | Status: DC | PRN

## 2013-05-02 MED ORDER — ACETAMINOPHEN 325 MG PO TABS
650.0000 mg | ORAL_TABLET | ORAL | Status: DC | PRN
Start: 2013-05-02 — End: 2013-05-10

## 2013-05-02 MED ORDER — ARTIFICIAL TEARS OP OINT
TOPICAL_OINTMENT | OPHTHALMIC | Status: DC | PRN
  Administered 2013-05-02: 1 via OPHTHALMIC

## 2013-05-02 MED ORDER — MIDAZOLAM HCL 5 MG/5ML IJ SOLN
INTRAMUSCULAR | Status: DC | PRN
  Administered 2013-05-02: 1 mg via INTRAVENOUS

## 2013-05-02 MED ORDER — MORPHINE SULFATE 2 MG/ML IJ SOLN
1.0000 mg | INTRAMUSCULAR | Status: DC | PRN
  Administered 2013-05-02 (×2): 2 mg via INTRAVENOUS
  Administered 2013-05-03 – 2013-05-06 (×2): 4 mg via INTRAVENOUS
  Filled 2013-05-02: qty 1
  Filled 2013-05-02 (×2): qty 2
  Filled 2013-05-02: qty 1

## 2013-05-02 MED ORDER — DOCUSATE SODIUM 100 MG PO CAPS
100.0000 mg | ORAL_CAPSULE | Freq: Two times a day (BID) | ORAL | Status: DC
  Administered 2013-05-02 – 2013-05-07 (×9): 100 mg via ORAL
  Filled 2013-05-02 (×8): qty 1

## 2013-05-02 MED ORDER — METFORMIN HCL 500 MG PO TABS
1000.0000 mg | ORAL_TABLET | Freq: Two times a day (BID) | ORAL | Status: DC
  Administered 2013-05-02 – 2013-05-10 (×16): 1000 mg via ORAL
  Filled 2013-05-02 (×18): qty 2

## 2013-05-02 MED ORDER — SODIUM CHLORIDE 0.9 % IV SOLN: 250.0000 mL | INTRAVENOUS | Status: DC

## 2013-05-02 MED ORDER — HYDROCHLOROTHIAZIDE 12.5 MG PO CAPS
12.5000 mg | ORAL_CAPSULE | Freq: Every day | ORAL | Status: DC
  Administered 2013-05-02 – 2013-05-10 (×7): 12.5 mg via ORAL
  Filled 2013-05-02 (×10): qty 1

## 2013-05-02 MED ORDER — PHENOL 1.4 % MT LIQD: 1.0000 | OROMUCOSAL | Status: DC | PRN

## 2013-05-02 MED ORDER — BISACODYL 10 MG RE SUPP
10.0000 mg | Freq: Every day | RECTAL | Status: DC | PRN
Start: 2013-05-02 — End: 2013-05-10

## 2013-05-02 MED ORDER — HYDROMORPHONE HCL PF 1 MG/ML IJ SOLN
INTRAMUSCULAR | Status: AC
  Filled 2013-05-02: qty 1

## 2013-05-02 MED ORDER — FLEET ENEMA 7-19 GM/118ML RE ENEM: 1.0000 | ENEMA | Freq: Once | RECTAL | Status: AC | PRN

## 2013-05-02 MED ORDER — ONDANSETRON HCL 4 MG/2ML IJ SOLN
4.0000 mg | INTRAMUSCULAR | Status: DC | PRN
  Administered 2013-05-04: 4 mg via INTRAVENOUS
  Filled 2013-05-02: qty 2

## 2013-05-02 MED ORDER — SENNA 8.6 MG PO TABS
1.0000 | ORAL_TABLET | Freq: Two times a day (BID) | ORAL | Status: DC
  Administered 2013-05-02 – 2013-05-07 (×9): 8.6 mg via ORAL
  Filled 2013-05-02 (×16): qty 1

## 2013-05-02 MED ORDER — BUPIVACAINE HCL (PF) 0.5 % IJ SOLN
INTRAMUSCULAR | Status: DC | PRN
  Administered 2013-05-02: 9 mL

## 2013-05-02 MED ORDER — CEFAZOLIN SODIUM-DEXTROSE 2-3 GM-% IV SOLR
INTRAVENOUS | Status: AC
  Administered 2013-05-02: 2 g via INTRAVENOUS
  Filled 2013-05-02: qty 50

## 2013-05-02 MED ORDER — TAMSULOSIN HCL 0.4 MG PO CAPS
0.4000 mg | ORAL_CAPSULE | Freq: Every day | ORAL | Status: DC
  Administered 2013-05-02 – 2013-05-09 (×8): 0.4 mg via ORAL
  Filled 2013-05-02 (×9): qty 1

## 2013-05-02 MED ORDER — PHENYLEPHRINE HCL 10 MG/ML IJ SOLN
10.0000 mg | INTRAVENOUS | Status: DC | PRN
  Administered 2013-05-02: 11:00:00 via INTRAVENOUS
  Administered 2013-05-02: 25 ug/min via INTRAVENOUS

## 2013-05-02 MED ORDER — ALUM & MAG HYDROXIDE-SIMETH 200-200-20 MG/5ML PO SUSP: 30.0000 mL | Freq: Four times a day (QID) | ORAL | Status: DC | PRN

## 2013-05-02 MED ORDER — EPHEDRINE SULFATE 50 MG/ML IJ SOLN
INTRAMUSCULAR | Status: DC | PRN
  Administered 2013-05-02 (×2): 10 mg via INTRAVENOUS

## 2013-05-02 MED ORDER — SODIUM CHLORIDE 0.9 % IJ SOLN
3.0000 mL | Freq: Two times a day (BID) | INTRAMUSCULAR | Status: DC
  Administered 2013-05-03 – 2013-05-08 (×7): 3 mL via INTRAVENOUS

## 2013-05-02 MED ORDER — LIDOCAINE HCL (CARDIAC) 20 MG/ML IV SOLN
INTRAVENOUS | Status: DC | PRN
  Administered 2013-05-02: 60 mg via INTRAVENOUS

## 2013-05-02 MED ORDER — SODIUM CHLORIDE 0.9 % IR SOLN
Status: DC | PRN
  Administered 2013-05-02: 09:00:00

## 2013-05-02 MED ORDER — PROPOFOL 10 MG/ML IV BOLUS
INTRAVENOUS | Status: DC | PRN
  Administered 2013-05-02: 160 mg via INTRAVENOUS

## 2013-05-02 MED ORDER — OXYCODONE HCL 5 MG/5ML PO SOLN: 5.0000 mg | Freq: Once | ORAL | Status: DC | PRN

## 2013-05-02 MED ORDER — LACTATED RINGERS IV SOLN
INTRAVENOUS | Status: DC | PRN
Start: 2013-05-02 — End: 2013-05-02
  Administered 2013-05-02 (×3): via INTRAVENOUS

## 2013-05-02 MED ORDER — MENTHOL 3 MG MT LOZG: 1.0000 | LOZENGE | OROMUCOSAL | Status: DC | PRN

## 2013-05-02 MED ORDER — LINAGLIPTIN 5 MG PO TABS
5.0000 mg | ORAL_TABLET | Freq: Every day | ORAL | Status: DC
  Administered 2013-05-03 – 2013-05-10 (×8): 5 mg via ORAL
  Filled 2013-05-02 (×9): qty 1

## 2013-05-02 MED ORDER — DIAZEPAM 2 MG PO TABS
2.0000 mg | ORAL_TABLET | Freq: Every day | ORAL | Status: DC
  Administered 2013-05-02 – 2013-05-09 (×8): 2 mg via ORAL
  Filled 2013-05-02 (×8): qty 1

## 2013-05-02 MED ORDER — METFORMIN HCL 500 MG PO TABS: 1000.0000 mg | ORAL_TABLET | Freq: Two times a day (BID) | ORAL | Status: DC

## 2013-05-02 MED ORDER — OXYCODONE-ACETAMINOPHEN 5-325 MG PO TABS
1.0000 | ORAL_TABLET | ORAL | Status: DC | PRN
  Administered 2013-05-02 – 2013-05-03 (×2): 2 via ORAL
  Administered 2013-05-03 (×2): 1 via ORAL
  Administered 2013-05-04 – 2013-05-10 (×26): 2 via ORAL
  Administered 2013-05-10: 1 via ORAL
  Filled 2013-05-02: qty 1
  Filled 2013-05-02 (×4): qty 2
  Filled 2013-05-02: qty 1
  Filled 2013-05-02 (×9): qty 2
  Filled 2013-05-02: qty 1
  Filled 2013-05-02 (×4): qty 2
  Filled 2013-05-02: qty 1
  Filled 2013-05-02 (×4): qty 2
  Filled 2013-05-02: qty 1
  Filled 2013-05-02: qty 2
  Filled 2013-05-02: qty 1
  Filled 2013-05-02 (×6): qty 2

## 2013-05-02 MED ORDER — METOPROLOL TARTRATE 25 MG PO TABS
25.0000 mg | ORAL_TABLET | Freq: Two times a day (BID) | ORAL | Status: DC
  Administered 2013-05-02 – 2013-05-10 (×12): 25 mg via ORAL
  Filled 2013-05-02 (×17): qty 1

## 2013-05-02 MED ORDER — GLYCOPYRROLATE 0.2 MG/ML IJ SOLN
INTRAMUSCULAR | Status: DC | PRN
  Administered 2013-05-02: 0.4 mg via INTRAVENOUS

## 2013-05-02 MED ORDER — ONDANSETRON HCL 4 MG/2ML IJ SOLN
INTRAMUSCULAR | Status: DC | PRN
  Administered 2013-05-02: 4 mg via INTRAVENOUS

## 2013-05-02 MED ORDER — SODIUM CHLORIDE 0.9 % IJ SOLN: 3.0000 mL | INTRAMUSCULAR | Status: DC | PRN

## 2013-05-02 MED ORDER — OXYCODONE HCL 5 MG PO TABS: 5.0000 mg | ORAL_TABLET | Freq: Once | ORAL | Status: DC | PRN

## 2013-05-02 MED ORDER — METHOCARBAMOL 500 MG PO TABS
500.0000 mg | ORAL_TABLET | Freq: Four times a day (QID) | ORAL | Status: DC | PRN
  Administered 2013-05-03 – 2013-05-07 (×9): 500 mg via ORAL
  Filled 2013-05-02 (×11): qty 1

## 2013-05-02 MED ORDER — CEFAZOLIN SODIUM 1-5 GM-% IV SOLN
1.0000 g | Freq: Three times a day (TID) | INTRAVENOUS | Status: AC
  Administered 2013-05-02 – 2013-05-03 (×2): 1 g via INTRAVENOUS
  Filled 2013-05-02 (×4): qty 50

## 2013-05-02 MED ORDER — FUROSEMIDE 40 MG PO TABS
40.0000 mg | ORAL_TABLET | Freq: Every day | ORAL | Status: DC
  Administered 2013-05-02 – 2013-05-10 (×9): 40 mg via ORAL
  Filled 2013-05-02 (×9): qty 1

## 2013-05-02 MED ORDER — THROMBIN 20000 UNITS EX SOLR
CUTANEOUS | Status: DC | PRN
  Administered 2013-05-02: 10:00:00 via TOPICAL

## 2013-05-02 MED ORDER — FENTANYL CITRATE 0.05 MG/ML IJ SOLN
INTRAMUSCULAR | Status: DC | PRN
  Administered 2013-05-02: 100 ug via INTRAVENOUS
  Administered 2013-05-02 (×2): 25 ug via INTRAVENOUS
  Administered 2013-05-02 (×2): 50 ug via INTRAVENOUS
  Administered 2013-05-02 (×2): 25 ug via INTRAVENOUS
  Administered 2013-05-02: 50 ug via INTRAVENOUS

## 2013-05-02 MED ORDER — FINASTERIDE 5 MG PO TABS
5.0000 mg | ORAL_TABLET | Freq: Every day | ORAL | Status: DC
  Administered 2013-05-02 – 2013-05-10 (×9): 5 mg via ORAL
  Filled 2013-05-02 (×9): qty 1

## 2013-05-02 MED ORDER — ROCURONIUM BROMIDE 100 MG/10ML IV SOLN
INTRAVENOUS | Status: DC | PRN
  Administered 2013-05-02 (×3): 10 mg via INTRAVENOUS
  Administered 2013-05-02: 50 mg via INTRAVENOUS
  Administered 2013-05-02 (×2): 10 mg via INTRAVENOUS

## 2013-05-02 MED ORDER — POLYETHYLENE GLYCOL 3350 17 G PO PACK
17.0000 g | PACK | Freq: Every day | ORAL | Status: DC | PRN
  Administered 2013-05-05: 17 g via ORAL
  Filled 2013-05-02: qty 1

## 2013-05-02 MED ORDER — METHOCARBAMOL 100 MG/ML IJ SOLN
500.0000 mg | Freq: Four times a day (QID) | INTRAVENOUS | Status: DC | PRN
  Administered 2013-05-02: 500 mg via INTRAVENOUS
  Filled 2013-05-02: qty 5

## 2013-05-02 MED ORDER — ACETAMINOPHEN 650 MG RE SUPP: 650.0000 mg | RECTAL | Status: DC | PRN

## 2013-05-02 MED ORDER — HYDROMORPHONE HCL PF 1 MG/ML IJ SOLN
0.2500 mg | INTRAMUSCULAR | Status: DC | PRN
  Administered 2013-05-02 (×2): 0.5 mg via INTRAVENOUS

## 2013-05-02 MED ORDER — SODIUM CHLORIDE 0.9 % IV SOLN
INTRAVENOUS | Status: DC | PRN
  Administered 2013-05-02: 12:00:00 via INTRAVENOUS

## 2013-05-02 MED ORDER — VALSARTAN-HYDROCHLOROTHIAZIDE 160-12.5 MG PO TABS: 1.0000 | ORAL_TABLET | Freq: Every day | ORAL | Status: DC

## 2013-05-02 MED ORDER — 0.9 % SODIUM CHLORIDE (POUR BTL) OPTIME
TOPICAL | Status: DC | PRN
  Administered 2013-05-02: 1000 mL

## 2013-05-02 MED ORDER — NEOSTIGMINE METHYLSULFATE 1 MG/ML IJ SOLN
INTRAMUSCULAR | Status: DC | PRN
  Administered 2013-05-02: 3 mg via INTRAVENOUS

## 2013-05-02 MED ORDER — SODIUM CHLORIDE 0.9 % IV SOLN
INTRAVENOUS | Status: DC
  Administered 2013-05-02 – 2013-05-04 (×3): via INTRAVENOUS

## 2013-05-02 MED ORDER — IRBESARTAN 150 MG PO TABS
150.0000 mg | ORAL_TABLET | Freq: Every day | ORAL | Status: DC
  Administered 2013-05-02 – 2013-05-10 (×7): 150 mg via ORAL
  Filled 2013-05-02 (×9): qty 1

## 2013-05-02 SURGICAL SUPPLY — 68 items
ADH SKN CLS APL DERMABOND .7 (GAUZE/BANDAGES/DRESSINGS)
ADH SKN CLS LQ APL DERMABOND (GAUZE/BANDAGES/DRESSINGS) ×1
BAG DECANTER FOR FLEXI CONT (MISCELLANEOUS) ×2 IMPLANT
BLADE SURG ROTATE 9660 (MISCELLANEOUS) IMPLANT
BUR MATCHSTICK NEURO 3.0 LAGG (BURR) ×2 IMPLANT
CAGE COROENT MP 12X9X23-4 (Cage) ×2 IMPLANT
CANISTER SUCT 3000ML (MISCELLANEOUS) ×2 IMPLANT
CONT SPEC 4OZ CLIKSEAL STRL BL (MISCELLANEOUS) ×4 IMPLANT
COVER BACK TABLE 24X17X13 BIG (DRAPES) IMPLANT
COVER TABLE BACK 60X90 (DRAPES) ×2 IMPLANT
DECANTER SPIKE VIAL GLASS SM (MISCELLANEOUS) ×2 IMPLANT
DERMABOND ADHESIVE PROPEN (GAUZE/BANDAGES/DRESSINGS) ×1
DERMABOND ADVANCED (GAUZE/BANDAGES/DRESSINGS)
DERMABOND ADVANCED .7 DNX12 (GAUZE/BANDAGES/DRESSINGS) ×1 IMPLANT
DERMABOND ADVANCED .7 DNX6 (GAUZE/BANDAGES/DRESSINGS) IMPLANT
DRAPE C-ARM 42X72 X-RAY (DRAPES) ×2 IMPLANT
DRAPE LAPAROTOMY 100X72X124 (DRAPES) ×2 IMPLANT
DRAPE POUCH INSTRU U-SHP 10X18 (DRAPES) ×2 IMPLANT
DRAPE PROXIMA HALF (DRAPES) ×2 IMPLANT
DRSG OPSITE POSTOP 4X8 (GAUZE/BANDAGES/DRESSINGS) ×1 IMPLANT
DURAPREP 26ML APPLICATOR (WOUND CARE) ×2 IMPLANT
ELECT REM PT RETURN 9FT ADLT (ELECTROSURGICAL) ×2
ELECTRODE REM PT RTRN 9FT ADLT (ELECTROSURGICAL) ×1 IMPLANT
GAUZE SPONGE 4X4 16PLY XRAY LF (GAUZE/BANDAGES/DRESSINGS) IMPLANT
GLOVE BIOGEL PI IND STRL 8.5 (GLOVE) ×2 IMPLANT
GLOVE BIOGEL PI INDICATOR 8.5 (GLOVE) ×2
GLOVE ECLIPSE 8.5 STRL (GLOVE) ×4 IMPLANT
GLOVE EXAM NITRILE LRG STRL (GLOVE) IMPLANT
GLOVE EXAM NITRILE MD LF STRL (GLOVE) IMPLANT
GLOVE EXAM NITRILE XL STR (GLOVE) IMPLANT
GLOVE EXAM NITRILE XS STR PU (GLOVE) IMPLANT
GOWN BRE IMP SLV AUR LG STRL (GOWN DISPOSABLE) IMPLANT
GOWN BRE IMP SLV AUR XL STRL (GOWN DISPOSABLE) IMPLANT
GOWN STRL REIN 2XL LVL4 (GOWN DISPOSABLE) IMPLANT
GOWN STRL REUS W/ TWL LRG LVL3 (GOWN DISPOSABLE) IMPLANT
GOWN STRL REUS W/TWL 2XL LVL3 (GOWN DISPOSABLE) ×4 IMPLANT
GOWN STRL REUS W/TWL LRG LVL3 (GOWN DISPOSABLE) ×4
HEMOSTAT POWDER KIT SURGIFOAM (HEMOSTASIS) IMPLANT
KIT BASIN OR (CUSTOM PROCEDURE TRAY) ×2 IMPLANT
KIT ROOM TURNOVER OR (KITS) ×2 IMPLANT
MILL MEDIUM DISP (BLADE) ×1 IMPLANT
NEEDLE HYPO 22GX1.5 SAFETY (NEEDLE) ×2 IMPLANT
NS IRRIG 1000ML POUR BTL (IV SOLUTION) ×2 IMPLANT
PACK FOAM VITOSS 10CC (Orthopedic Implant) ×1 IMPLANT
PACK LAMINECTOMY NEURO (CUSTOM PROCEDURE TRAY) ×2 IMPLANT
PAD ARMBOARD 7.5X6 YLW CONV (MISCELLANEOUS) ×6 IMPLANT
PATTIES SURGICAL .5 X1 (DISPOSABLE) ×2 IMPLANT
ROD REBENT 30MM (Rod) ×2 IMPLANT
SCREW LOCK (Screw) ×8 IMPLANT
SCREW LOCK 100X5.5X OPN (Screw) IMPLANT
SCREW POLY 45X6.5 (Screw) IMPLANT
SCREW POLY 6.5X45MM (Screw) ×8 IMPLANT
SPONGE GAUZE 4X4 12PLY (GAUZE/BANDAGES/DRESSINGS) ×2 IMPLANT
SPONGE LAP 4X18 X RAY DECT (DISPOSABLE) IMPLANT
SPONGE SURGIFOAM ABS GEL 100 (HEMOSTASIS) ×2 IMPLANT
SUT BONE WAX W31G (SUTURE) ×1 IMPLANT
SUT VIC AB 1 CT1 18XBRD ANBCTR (SUTURE) ×1 IMPLANT
SUT VIC AB 1 CT1 8-18 (SUTURE) ×2
SUT VIC AB 2-0 CP2 18 (SUTURE) ×2 IMPLANT
SUT VIC AB 3-0 SH 8-18 (SUTURE) ×3 IMPLANT
SYR 20ML ECCENTRIC (SYRINGE) ×2 IMPLANT
SYR 3ML LL SCALE MARK (SYRINGE) ×4 IMPLANT
TOWEL OR 17X24 6PK STRL BLUE (TOWEL DISPOSABLE) ×2 IMPLANT
TOWEL OR 17X26 10 PK STRL BLUE (TOWEL DISPOSABLE) ×2 IMPLANT
TRAP SPECIMEN MUCOUS 40CC (MISCELLANEOUS) ×2 IMPLANT
TRAY FOLEY CATH 14FRSI W/METER (CATHETERS) ×1 IMPLANT
TRAY FOLEY CATH 16FRSI W/METER (SET/KITS/TRAYS/PACK) ×1 IMPLANT
WATER STERILE IRR 1000ML POUR (IV SOLUTION) ×2 IMPLANT

## 2013-05-02 NOTE — Progress Notes (Signed)
Patient ID: [name], male   DOB: [birth year], 80 y.o.   MRN: [id number] Alert awake comfortable. Moving both lower extremities well. Complains of localized back pain. Stable postoperatively.

## 2013-05-02 NOTE — Progress Notes (Signed)
UR completed 

## 2013-05-02 NOTE — H&P (Signed)
CHIEF COMPLAINT:                              [name] has ongoing problems with cramping in his legs, back pain, and generalized discomfort with walking in addition to urinary incontinence.  HOPI:                                                   I had seen [name] back in 2011 when we evaluated his lumbar spine for lumbar stenosis.  At that time, I did an epidural steroid injection and I also did a diagnostic myelogram, which demonstrated that he had moderately severe stenosis at the level of L4-L5.  His back otherwise appeared fairly healthy.  He has been having continued symptoms.  He notes that the last of the epidural steroid injections did not seem to give much relief.  He has some diabetes that complicates the situation in addition to some congestive heart failure and he is under the care of Dr. Adella Hare for all these problems.  He tells me that these past several weeks, he was admitted to the hospital for urinary tract infection.  He has been suffering with some urinary incontinence.  DATA:                                                  A CT scan of the abdomen was performed most recently during this hospitalization.  I have had the opportunity to review the study.  The study again recapitulates the spondylolisthesis at the level of L4-L5 with what appears to be a high-grade stenosis at that level.  Again, the rest of his spine shows good alignment and the spinal canal appears amply patent.  INTERVAL PFSH:                                  His past medical history is notable for some difficulties with severe cramping of his legs that occurs on a nightly basis, difficulties with some knee arthritis.  He also notes that he has got some congestive failure.  He feels that he has had some short-term memory loss, but he notes that his level of function has been hampered particularly by the leg cramps and by episodic urinary incontinence.  PHYSICAL EXAMINATION:                    On  physical exam, he stands straight and erect.  His motor strength appears good in the plantar flexors and reasonably good in the dorsiflexors.  His station and gait is intact.  Palpation of his back does not reproduce any pain and he has the ability to flex forward plus 60 degrees.  He erects without difficulty.  His deep tendon reflexes are 1+ in both patellae and absent in both Achilles.  IMPRESSION/PLAN:                             The patient has evidence of severe spondylotic stenosis at L4-L5 secondary to  a spondylolisthesis.  Rest of his spine appears to be fairly healthy and stable.  He describes to me the worst of his problem is being that of the pain and cramping in his lower extremities.  In addition, he notes that he has urinary incontinence.  I have advised that the incontinence issue should be evaluated urologically.  He does appear to have some calcifications in his prostate and his incontinence may relate as much or more to that than those to the stenosis.  Nonetheless, the severe cramping that he has in his legs can be both a by-product of the stenosis and his diabetic neuropathy.  I believe that he would be an appropriate candidate for a one-level decompression and arthrodesis.  I discussed with him the surgery via posterior approach where we would remove the overgrown bone causing the stenosis and place bone graft in the interspace between the L4-L5 space with pedicle screws to hold the vertebrae together and allow this process to fuse.  This is indeed a substantial surgery.  Typical hospital stay is about four days.  Recovery takes about three to four months.  [name] does have some concerns about his diabetes and his congestive failure, but I believe that he should tolerate the surgery and hopefully should see a good result with less pain and less cramping.  Nonetheless, the ultimate decision to proceed is still in his hands.  We would like to understand from Dr. Linda Hedges what he feels Andrew's risk  factors are in terms of the surgery, but I believe that we can proceed if he so desires and he feels the situation warrants it.  We will plan on scheduling at his convenience.

## 2013-05-02 NOTE — Anesthesia Preprocedure Evaluation (Addendum)
Anesthesia Evaluation  Patient identified by MRN, date of birth, ID band Patient awake    Reviewed: Allergy & Precautions, H&P , NPO status , Patient's Chart, lab work & pertinent test results, reviewed documented beta blocker date and time   Airway Mallampati: II TM Distance: >3 FB Neck ROM: full    Dental  (+) Dental Advisory Given   Pulmonary sleep apnea , former smoker,  breath sounds clear to auscultation        Cardiovascular hypertension, Pt. on medications and Pt. on home beta blockers + angina + CAD and + Cardiac Stents + Valvular Problems/Murmurs Rhythm:regular     Neuro/Psych PSYCHIATRIC DISORDERS Depression  Neuromuscular disease    GI/Hepatic negative GI ROS, Neg liver ROS,   Endo/Other  diabetes, Type 2, Insulin Dependent and Oral Hypoglycemic Agents  Renal/GU Renal disease  negative genitourinary   Musculoskeletal   Abdominal   Peds  Hematology negative hematology ROS (+)   Anesthesia Other Findings See surgeon's H&P   Reproductive/Obstetrics negative OB ROS                         Anesthesia Physical Anesthesia Plan  ASA: III  Anesthesia Plan: General   Post-op Pain Management:    Induction: Intravenous  Airway Management Planned: Oral ETT  Additional Equipment:   Intra-op Plan:   Post-operative Plan: Extubation in OR  Informed Consent: I have reviewed the patients History and Physical, chart, labs and discussed the procedure including the risks, benefits and alternatives for the proposed anesthesia with the patient or authorized representative who has indicated his/her understanding and acceptance.   Dental Advisory Given  Plan Discussed with: CRNA, Surgeon and Anesthesiologist  Anesthesia Plan Comments:        Anesthesia Quick Evaluation

## 2013-05-02 NOTE — Transfer of Care (Cosign Needed)
Immediate Anesthesia Transfer of Care Note  Patient: [name]  Procedure(s) Performed: Procedure(s) with comments: Lumbar four-five Posterior lumbar interbody fusion with pedicle screw fixation using Stealth Navigation and O arm (N/A) - Lumbar four-five Posterior lumbar interbody fusion with pedicle screw fixation using Stealth Navigation and O arm  Patient Location: PACU  Anesthesia Type:General  Level of Consciousness: awake and alert   Airway & Oxygen Therapy: Patient Spontanous Breathing and Patient connected to nasal cannula oxygen  Post-op Assessment: Report given to PACU RN, Post -op Vital signs reviewed and stable and Patient moving all extremities X 4  Post vital signs: Reviewed and stable  Complications: No apparent anesthesia complications

## 2013-05-02 NOTE — Anesthesia Procedure Notes (Signed)
Procedure Name: Intubation Date/Time: 05/02/2013 8:07 AM Performed by: Carola Frost Pre-anesthesia Checklist: Patient identified, Timeout performed, Emergency Drugs available, Suction available and Patient being monitored Patient Re-evaluated:Patient Re-evaluated prior to inductionOxygen Delivery Method: Circle system utilized Preoxygenation: Pre-oxygenation with 100% oxygen Intubation Type: IV induction Ventilation: Mask ventilation without difficulty and Oral airway inserted - appropriate to patient size Laryngoscope Size: Mac and 4 Grade View: Grade III Tube type: Oral Tube size: 7.5 mm Number of attempts: 1 Airway Equipment and Method: Stylet Placement Confirmation: positive ETCO2,  ETT inserted through vocal cords under direct vision and breath sounds checked- equal and bilateral Secured at: 23 cm Tube secured with: Tape Dental Injury: Teeth and Oropharynx as per pre-operative assessment

## 2013-05-02 NOTE — Op Note (Signed)
Date of surgery: 05/02/2013 Preoperative diagnosis: Spondylolisthesis and stenosis L4-L5 with neurogenic claudication, lumbar radiculopathy, cauda equina syndrome. Postoperative diagnosis: Spondylolisthesis and stenosis L4-L5 with neurogenic claudication, lumbar radiculopathy, cauda equina syndrome. Procedure: L4-L5 decompressive laminectomy decompression of L4 and L5 nerve roots, posterior lumbar interbody arthrodesis with peek spacers local autograft and allograft, pedicle screw fixation L4-L5, posterior lateral arthrodesis L4-L5 using autograft and allograft, neuro- navigation using Stealth.  Surgeon: Kristeen Miss M.D.  Asst.: Hazle Coca M.D.  Indications: Patient is [name] is a 80 y.o. male who who's had significant back pain and lumbar radiculopathy for over a years period time. A lumbar myelogram demonstrates advanced spondylolisthesis with high-grade canal stenosis. he was advised regarding surgical intervention.  Procedure: The patient was brought to the operating room supine on a stretcher. After the smooth induction of general endotracheal anesthesia she was turned prone and the back was prepped with alcohol and DuraPrep. The back was then draped sterilely. A reference frame was placed in the right posterior superior iliac crest for Stealth guidance using in all arm. After the initial spin of theO arm, a midline incision was created and carried down to the lumbar dorsal fascia. Neuro- navigation identified the L4 and L5 spinous processes. A subligamentous dissection was created at L4 and L5 to expose the interlaminar space at L4 and L5 and the facet joints over the L4-L5 interspace. Laminotomies were were then created removing the entire inferior margin of the lamina of L4 including the inferior facet at the L4-L5 joint. The yellow ligament was taken up and the common dural tube was exposed along with the L4 nerve root superiorly, and the L5 nerve root inferiorly, the disc space was  exposed and epidural veins in this region were cauterized and divided. The L4 nerve roots and the L5 nerve root were dissected with care taken to protect them. The disc space was opened and a combination of curettes and rongeurs was used to evacuate the disc space fully. The endplates were removed using sharp curettes. An interbody spacer was placed to distract the disc space while the contralateral discectomy was performed. When the entirety of the disc was removed and the endplates were prepared final sizing of the disc space was obtained 12 mm peek spacers were chosen and packed with autograft and allograft and placed into the interspace. The remainder of the interspace was packed with autograft and allograft. Pedicle entry sites were then chosen using neuro- navigation and 6.5 x 45 mm screws were placed in L4 and 6.5 x 45 mm screws were placed in L5. The lateral gutters were decorticated and graft was packed in the posterolateral gutters between L4 and L5. Final spin of the O arm were obtained after placing appropriately sized rods between the pedicle screws at L4-L5 and torquing these to the appropriate tension. The surgical site was inspected carefully to assure the L4 and L5 nerve roots were well decompressed, hemostasis was obtained, and the graft was well packed. Then the retractors were removed and the wound was closed with #1 Vicryl in the lumbar dorsal fascia 2-0 Vicryl in the subcutaneous tissue and 3-0 Vicryl subcuticularly. When he cc of half percent Marcaine was injected into the paraspinous musculature at the time of closure. Blood loss was estimated at 600 cc. 175 cc of Cell Saver blood was returned to the patient. The patient tolerated procedure well and was returned to the recovery room in stable condition.

## 2013-05-02 NOTE — Anesthesia Postprocedure Evaluation (Signed)
Anesthesia Post Note  Patient: [name]  Procedure(s) Performed: Procedure(s) (LRB): Lumbar four-five Posterior lumbar interbody fusion with pedicle screw fixation using Stealth Navigation and O arm (N/A)  Anesthesia type: General  Patient location: PACU  Post pain: Pain level controlled  Post assessment: Patient's Cardiovascular Status Stable  Last Vitals:  Filed Vitals:   05/02/13 1248  BP: 113/52  Pulse: 67  Temp: 36.1 C  Resp: 27    Post vital signs: Reviewed and stable  Level of consciousness: alert  Complications: No apparent anesthesia complications

## 2013-05-02 NOTE — Preoperative (Signed)
Beta Blockers   Reason not to administer Beta Blockers:Not Applicable, pt took lopressor 05/02/13

## 2013-05-03 LAB — BASIC METABOLIC PANEL
BUN: 17 mg/dL (ref 6–23)
CALCIUM: 8.1 mg/dL — AB (ref 8.4–10.5)
CO2: 25 mEq/L (ref 19–32)
CREATININE: 0.79 mg/dL (ref 0.50–1.35)
Chloride: 102 mEq/L (ref 96–112)
GFR, EST NON AFRICAN AMERICAN: 83 mL/min — AB (ref >90)
Glucose, Bld: 174 mg/dL — ABNORMAL HIGH (ref 70–99)
Potassium: 3.8 mEq/L (ref 3.7–5.3)
Sodium: 141 mEq/L (ref 137–147)

## 2013-05-03 LAB — GLUCOSE, CAPILLARY: Glucose-Capillary: 176 mg/dL — ABNORMAL HIGH (ref 70–99)

## 2013-05-03 LAB — CBC
HEMATOCRIT: 31.8 % — AB (ref 39.0–52.0)
Hemoglobin: 11 g/dL — ABNORMAL LOW (ref 13.0–17.0)
MCH: 30.7 pg (ref 26.0–34.0)
MCHC: 34.6 g/dL (ref 30.0–36.0)
MCV: 88.8 fL (ref 78.0–100.0)
Platelets: 129 10*3/uL — ABNORMAL LOW (ref 150–400)
RBC: 3.58 MIL/uL — ABNORMAL LOW (ref 4.22–5.81)
RDW: 13 % (ref 11.5–15.5)
WBC: 6.8 10*3/uL (ref 4.0–10.5)

## 2013-05-03 MED FILL — Sodium Chloride IV Soln 0.9%: INTRAVENOUS | Qty: 1000 | Status: AC

## 2013-05-03 MED FILL — Heparin Sodium (Porcine) Inj 1000 Unit/ML: INTRAMUSCULAR | Qty: 30 | Status: AC

## 2013-05-03 NOTE — Progress Notes (Signed)
At 1430, patient was bladder scan. Had about 325 urine output and did not meet criteria for straight catheter. Therefore he was encouraged to drink a lot of fluids and will scan him later. Will continue to monitor.

## 2013-05-03 NOTE — Progress Notes (Signed)
RN assessed patient for urine output, and realized patient had had 400 cc output. Bladder scan was done it measured 478. Straight cath was done and had urine output of 550 cc. Patient was encouraged to drink a lot of fluid will pass it on to the incoming staff.

## 2013-05-03 NOTE — Evaluation (Signed)
Physical Therapy Evaluation Patient Details Name: [name] MRN: [id number] DOB: 11/29/1931 Today's Date: 05/03/2013 Time: 1130-1200 PT Time Calculation (min): 30 min  PT Assessment / Plan / Recommendation History of Present Illness  80 y.o. s/p Lumbar four-five Posterior lumbar interbody fusion with pedicle screw fixation using Stealth Navigation and O arm (N/A)  Clinical Impression  Pt with increased lethargy most likely from pain medicine but able to pariticipate in PT. Suspect pt will progress well once pain medicine more controlled and be safe for d/c home with assist of spouse and use of RW.    PT Assessment  Patient needs continued PT services    Follow Up Recommendations  Home health PT;Supervision/Assistance - 24 hour    Does the patient have the potential to tolerate intense rehabilitation      Barriers to Discharge        Equipment Recommendations  Rolling walker with 5" wheels;3in1 (PT)    Recommendations for Other Services     Frequency Min 5X/week    Precautions / Restrictions Precautions Precautions: Back;Fall Precaution Booklet Issued: Yes (comment) Precaution Comments: Reviewed precautions with pt and wife present Required Braces or Orthoses: Spinal Brace Spinal Brace: Lumbar corset;Applied in sitting position Restrictions Weight Bearing Restrictions: No   Pertinent Vitals/Pain 4/10 back pain      Mobility  Bed Mobility Overal bed mobility: Needs Assistance Bed Mobility: Rolling;Sidelying to Sit Rolling: Min guard Sidelying to sit: Max assist General bed mobility comments: Assisted with bringing legs off bed and elevating trunk. Elevated HOB some to assist. Transfers Overall transfer level: Needs assistance Equipment used: Rolling walker (2 wheeled) Transfers: Sit to/from Stand Sit to Stand: Max assist General transfer comment: max v/c's, assist to initially clear bottom Ambulation/Gait Ambulation/Gait assistance: Min assist Ambulation  Distance (Feet): 50 Feet Assistive device: Rolling walker (2 wheeled) Gait Pattern/deviations: Decreased stride length;Step-through pattern Gait velocity: slow General Gait Details: v/c's for safe walker management, minA to keep walker close, 1 episode of LOB requiring modA to regain balance    Exercises     PT Diagnosis: Difficulty walking;Acute pain  PT Problem List: Decreased strength;Decreased activity tolerance;Decreased balance;Decreased mobility PT Treatment Interventions: DME instruction;Gait training;Stair training;Functional mobility training;Therapeutic activities;Therapeutic exercise     PT Goals(Current goals can be found in the care plan section) Acute Rehab PT Goals Patient Stated Goal: not stated PT Goal Formulation: With patient Time For Goal Achievement: 05/10/13 Potential to Achieve Goals: Good  Visit Information  Last PT Received On: 05/03/13 Assistance Needed: +1 (2nd person nice for chair follow) History of Present Illness: 80 y.o. s/p Lumbar four-five Posterior lumbar interbody fusion with pedicle screw fixation using Stealth Navigation and O arm (N/A)       Prior [place] expects to be discharged to:: Private residence Living Arrangements: Spouse/significant other Available Help at Discharge: Family;Available 24 hours/day Type of Home: House Home Access: Stairs to enter [employer] of Steps: 6 Entrance Stairs-Rails: Left Home Layout: Two level;Able to live on main level with bedroom/bathroom Home Equipment: Kasandra Knudsen - single point Prior Function Level of Independence: Independent with assistive device(s) Communication Communication: No difficulties Dominant Hand: Left    Cognition  Cognition Arousal/Alertness: Awake/alert Behavior During Therapy: WFL for tasks assessed/performed Overall Cognitive Status: Within Functional Limits for tasks assessed    Extremity/Trunk Assessment Upper Extremity  Assessment Upper Extremity Assessment: Defer to OT evaluation Lower Extremity Assessment Lower Extremity Assessment: Overall WFL for tasks assessed   Balance    End of  Session PT - End of Session Equipment Utilized During Treatment: Gait belt Activity Tolerance: Patient tolerated treatment well Patient left: in chair;with call bell/phone within reach;with family/visitor present Nurse Communication: Mobility status  GP     [name] 05/03/2013, 12:25 PM  [name], PT, DPT Pager #: [phone] Office #: [phone]

## 2013-05-03 NOTE — Evaluation (Addendum)
Occupational Therapy Evaluation Patient Details Name: [name] MRN: [id number] DOB: 06-24-1931 Today's Date: 05/03/2013 Time: 9211-9417 OT Time Calculation (min): 22 min  OT Assessment / Plan / Recommendation History of present illness 80 y.o. s/p Lumbar four-five Posterior lumbar interbody fusion with pedicle screw fixation using Stealth Navigation and O arm (N/A)   Clinical Impression   Pt presents with below problem list. Pt independent with ADLs, PTA. Feel pt will benefit from acute OT to increase independence prior to d/c.     OT Assessment  Patient needs continued OT Services    Follow Up Recommendations  Home health OT;Supervision/Assistance - 24 hour    Barriers to Discharge      Equipment Recommendations  3 in 1 bedside comode;Other (comment) (shower equipment tbd)    Recommendations for Other Services    Frequency  Min 2X/week    Precautions / Restrictions Precautions Precautions: Back;Fall Precaution Booklet Issued: No Precaution Comments: Reviewed precautions with pt and wife present Required Braces or Orthoses: Spinal Brace Spinal Brace: Lumbar corset;Applied in sitting position Restrictions Weight Bearing Restrictions: No   Pertinent Vitals/Pain Pain 11/10. Increased activity.     ADL  Grooming: Set up;Supervision/safety Where Assessed - Grooming: Supported sitting Upper Body Dressing: Maximal assistance (back brace) Where Assessed - Upper Body Dressing: Unsupported sitting;Other (comment) (sitting EOB) Lower Body Dressing: Maximal assistance Where Assessed - Lower Body Dressing: Supported sit to stand Toilet Transfer: Maximal assistance Toilet Transfer Method: Sit to Loss adjuster, chartered: Other (comment) (from bed) Tub/Shower Transfer Method: Not assessed Equipment Used: Gait belt;Back brace;Long-handled shoe horn;Long-handled sponge;Rolling walker;Reacher;Sock aid Transfers/Ambulation Related to ADLs: Assisted with walker during  ambulation. Max A for sit to stand from bed and Min A for stand to sit to chair.  ADL Comments: Educated on AE for LB ADLs. Discussed use of toilet aid for hygiene.  Educated on back brace.   OT Diagnosis: Acute pain  OT Problem List: Decreased strength;Decreased range of motion;Decreased activity tolerance;Impaired balance (sitting and/or standing);Decreased knowledge of use of DME or AE;Decreased knowledge of precautions;Pain OT Treatment Interventions: Self-care/ADL training;DME and/or AE instruction;Therapeutic activities;Balance training;Patient/family education   OT Goals(Current goals can be found in the care plan section) Acute Rehab OT Goals Patient Stated Goal: not stated OT Goal Formulation: With patient Time For Goal Achievement: 05/10/13 Potential to Achieve Goals: Good ADL Goals Pt Will Perform Grooming: standing;with supervision Pt Will Perform Lower Body Bathing: with set-up;with supervision;with adaptive equipment;sit to/from stand Pt Will Perform Lower Body Dressing: with set-up;with supervision;with adaptive equipment;sit to/from stand Pt Will Transfer to Toilet: with supervision;ambulating (3 in 1 over commode) Pt Will Perform Toileting - Clothing Manipulation and hygiene: with supervision;sit to/from stand Additional ADL Goal #1: Pt will independently verbalize 3/3 back precautions.   Additional ADL Goal #2: Pt will perform bed mobility at supervision level as precursor for ADLs.    Visit Information  Last OT Received On: 05/03/13 Assistance Needed: +2 (+2 for distant ambulation) History of Present Illness: 80 y.o. s/p Lumbar four-five Posterior lumbar interbody fusion with pedicle screw fixation using Stealth Navigation and O arm (N/A)       Prior [place] [place] expects to be discharged to:: Private residence Living Arrangements: Spouse/significant other Available Help at Discharge: Family;Available 24 hours/day Type of Home:  House Home Access: Stairs to enter [employer] of Steps: 6 Entrance Stairs-Rails: Left Home Layout: Two level;Able to live on main level with bedroom/bathroom Home Equipment: Kasandra Knudsen - single point  Prior Function Level of Independence: Independent with assistive device(s) Communication Communication: No difficulties Dominant Hand: Left         Vision/Perception     Cognition  Cognition Arousal/Alertness: Awake/alert Behavior During Therapy: WFL for tasks assessed/performed Overall Cognitive Status: Within Functional Limits for tasks assessed    Extremity/Trunk Assessment Upper Extremity Assessment Upper Extremity Assessment: Overall WFL for tasks assessed Lower Extremity Assessment Lower Extremity Assessment: Defer to PT evaluation     Mobility Bed Mobility Overal bed mobility: Needs Assistance Bed Mobility: Rolling;Sidelying to Sit Rolling: Min guard Sidelying to sit: Max assist General bed mobility comments: Assisted with bringing legs off bed and elevating trunk. Elevated HOB some to assist. Transfers Overall transfer level: Needs assistance Equipment used: Rolling walker (2 wheeled) Transfers: Sit to/from Stand Sit to Stand: Min assist;Max assist General transfer comment: Max A for sit to stand from bed. Min A for stand to sit transfer. Cues for technique.     Exercise     Balance     End of Session OT - End of Session Equipment Utilized During Treatment: Gait belt;Rolling walker;Back brace Activity Tolerance: Patient limited by pain Patient left: in chair;with call bell/phone within reach;with family/visitor present Nurse Communication: Other (comment);Mobility status (pain level)  GO     Benito Mccreedy OTR/L [phone] 05/03/2013, 11:50 AM

## 2013-05-04 LAB — TYPE AND SCREEN
ABO/RH(D): O POS
ANTIBODY SCREEN: POSITIVE

## 2013-05-04 LAB — GLUCOSE, CAPILLARY
Glucose-Capillary: 139 mg/dL — ABNORMAL HIGH (ref 70–99)
Glucose-Capillary: 155 mg/dL — ABNORMAL HIGH (ref 70–99)

## 2013-05-04 NOTE — Clinical Social Work Note (Signed)
CSW received consult for possible SNF placement at time of discharge. PT/OT are recommending pt to be discharged home with home health services. CSW signing off. Please re consult if discharge disposition changes.  Pati Gallo, [place] Social Worker [phone]

## 2013-05-04 NOTE — Progress Notes (Signed)
Physical Therapy Treatment Patient Details Name: [name] MRN: [id number] DOB: 08-30-1931 Today's Date: 05/04/2013 Time: 9150-5697 PT Time Calculation (min): 39 min  PT Assessment / Plan / Recommendation  History of Present Illness 80 y.o. s/p Lumbar four-five Posterior lumbar interbody fusion with pedicle screw fixation using Stealth Navigation and O arm (N/A)   PT Comments   Patient progressing well. Encouraged to sit up for meals and throughout the day. Pain is limiting factor at this time but patient should progress well enough to DC home in the next couple of days.   Follow Up Recommendations  Home health PT;Supervision/Assistance - 24 hour     Does the patient have the potential to tolerate intense rehabilitation     Barriers to Discharge        Equipment Recommendations  Rolling walker with 5" wheels;3in1 (PT)    Recommendations for Other Services    Frequency Min 5X/week   Progress towards PT Goals Progress towards PT goals: Progressing toward goals  Plan Current plan remains appropriate    Precautions / Restrictions Precautions Precautions: Back;Fall Precaution Comments: Reviewed precautions with pt and wife present Required Braces or Orthoses: Spinal Brace Spinal Brace: Lumbar corset;Applied in sitting position   Pertinent Vitals/Pain 9/10 back pain patient repositioned for comfort     Mobility  Bed Mobility Rolling: Min assist Sidelying to sit: Max assist General bed mobility comments: Assisted with bringing legs off bed and elevating trunk. Elevated HOB some to assist. Transfers Overall transfer level: Needs assistance Equipment used: Rolling walker (2 wheeled) Sit to Stand: Mod assist General transfer comment: Cues for hand placement and A to transition into standing.  Ambulation/Gait Ambulation/Gait assistance: Min assist Ambulation Distance (Feet): 60 Feet Gait Pattern/deviations: Step-to pattern;Decreased step length - left;Decreased step length  - right Gait velocity: decreased General Gait Details: Cues for RW management, posture and step length.     Exercises     PT Diagnosis:    PT Problem List:   PT Treatment Interventions:     PT Goals (current goals can now be found in the care plan section)    Visit Information  Last PT Received On: 05/04/13 Assistance Needed: +1 History of Present Illness: 80 y.o. s/p Lumbar four-five Posterior lumbar interbody fusion with pedicle screw fixation using Stealth Navigation and O arm (N/A)    Subjective Data      Cognition  Cognition Arousal/Alertness: Awake/alert Behavior During Therapy: WFL for tasks assessed/performed Overall Cognitive Status: Within Functional Limits for tasks assessed    Balance     End of Session PT - End of Session Equipment Utilized During Treatment: Gait belt Activity Tolerance: Patient tolerated treatment well;Patient limited by pain Patient left: in chair;with call bell/phone within reach Nurse Communication: Mobility status   GP     [name] 05/04/2013, 8:55 AM  05/04/2013 [name] PTA [phone] pager [phone] office

## 2013-05-04 NOTE — Progress Notes (Signed)
OT Cancellation Note  Patient Details Name: [name] MRN: [id number] DOB: 11/24/1931   Cancelled Treatment:    Reason Eval/Treat Not Completed: Pain limiting ability to participate . Will recheck on pt next day per wife request.     Betsy Pries 05/04/2013, 10:49 AM

## 2013-05-04 NOTE — Progress Notes (Signed)
Talked to patient's spouse about DCP; patient asleep, spouse stated that he had a rough night and didn't get any sleep; Patient's spouse is worried that she cannot care for him at home and is thinking about short term snf prior to returning home; CM informed spouse that we will watch his progress with physical therapy and if the progress is slow we will talk to him about SNF; Mindi Slicker Rn,BSN,MHA [phone]

## 2013-05-04 NOTE — Progress Notes (Signed)
Subjective: Patient reports Not emptying bladder adequately requiring straight catheterization, Foley replaced. Back pain leg pain notable to  Objective: Vital signs in last 24 hours: Temp:  [98.1 F (36.7 C)-100.2 F (37.9 C)] 98.3 F (36.8 C) (01/22 0625) Pulse Rate:  [72-87] 72 (01/22 0625) Resp:  [18] 18 (01/22 0625) BP: (88-117)/(46-56) 113/56 mmHg (01/22 0625) SpO2:  [92 %-97 %] 97 % (01/22 0625)  Intake/Output from previous day: 01/21 0701 - 01/22 0700 In: 1100 [I.V.:1100] Out: 1850 [Urine:1850] Intake/Output this shift:    Incision is clean and dry motor function appears intact in lower extremities.  Lab Results:  Recent Labs  05/03/13 0422  WBC 6.8  HGB 11.0*  HCT 31.8*  PLT 129*   BMET  Recent Labs  05/03/13 0422  NA 141  K 3.8  CL 102  CO2 25  GLUCOSE 174*  BUN 17  CREATININE 0.79  CALCIUM 8.1*    Studies/Results: Dg Lumbar Spine 2-3 Views  05/02/2013   CLINICAL DATA:  L4-5 PLIF.  EXAM: LUMBAR SPINE - 2-3 VIEW  COMPARISON:  Abdominal pelvic CT 10/13/2012.  FINDINGS: Five lumbar type vertebral bodies are assumed. Three spot fluoroscopic images demonstrate laminectomy and interbody fusion at L4-5. Interbody spacers and bilateral pedicle screws appear well positioned. No complications are identified.  IMPRESSION: Intraoperative views during L4-5 PLIF. No demonstrated complication.   Electronically Signed   By: Camie Patience M.D.   On: 05/02/2013 18:27    Assessment/Plan: Difficulty with voiding may be aggravated by patient's requirement for pain medication at this time. Leave Foley catheter in place.  LOS: 2 days  Mobilize as tolerated. Foley catheter in place secondary to inability to empty bladder adequately   Kaitland Lewellyn J 05/04/2013, 8:47 AM

## 2013-05-05 LAB — GLUCOSE, CAPILLARY
GLUCOSE-CAPILLARY: 156 mg/dL — AB (ref 70–99)
Glucose-Capillary: 176 mg/dL — ABNORMAL HIGH (ref 70–99)
Glucose-Capillary: 162 mg/dL — ABNORMAL HIGH (ref 70–99)
Glucose-Capillary: 152 mg/dL — ABNORMAL HIGH (ref 70–99)

## 2013-05-05 NOTE — Progress Notes (Signed)
Physical Therapy Treatment Patient Details Name: [name] MRN: [id number] DOB: 1932/02/22 Today's Date: 05/05/2013 Time: 9242-6834 PT Time Calculation (min): 31 min  PT Assessment / Plan / Recommendation  History of Present Illness 80 y.o. s/p Lumbar four-five Posterior lumbar interbody fusion with pedicle screw fixation using Stealth Navigation and O arm (N/A)   PT Comments   Patient progressing slowly with therapy today. Still needing Mod A for bed mobility and standing. Wife will not be able to provide this at home. Spoke with wife and she is concerned that if patient doesn't progress that SNF for rehab might be appropriate and I agree. CSW and Case manager aware. Will see how patient progresses today and tomorrow for updated recommendation. Patient prefers to go home when [facility].  Follow Up Recommendations  Home health PT;Supervision/Assistance - 24 hour     Does the patient have the potential to tolerate intense rehabilitation     Barriers to Discharge        Equipment Recommendations  Rolling walker with 5" wheels;3in1 (PT)    Recommendations for Other Services    Frequency Min 5X/week   Progress towards PT Goals Progress towards PT goals: Progressing toward goals  Plan Current plan remains appropriate    Precautions / Restrictions Precautions Precautions: Back;Fall Precaution Comments: Reviewed precautions with pt and wife present Required Braces or Orthoses: Spinal Brace Spinal Brace: Lumbar corset;Applied in sitting position Restrictions Weight Bearing Restrictions: No   Pertinent Vitals/Pain Complained of back pain but did not rate. RN aware    Mobility  Bed Mobility Overal bed mobility: Needs Assistance Sidelying to sit: Mod assist General bed mobility comments: Assisted with bringing legs off bed and elevating trunk. Elevated HOB some to assist. Transfers Overall transfer level: Needs assistance Equipment used: Rolling walker (2 wheeled) Transfers: Sit  to/from Stand Sit to Stand: Mod assist General transfer comment: Cues for hand placement and A to transition into standing.  Ambulation/Gait Ambulation/Gait assistance: Min assist Ambulation Distance (Feet): 100 Feet Assistive device: Rolling walker (2 wheeled) Gait Pattern/deviations: Step-to pattern;Decreased stride length;Step-through pattern Gait velocity: decreased Gait velocity interpretation: Below normal speed for age/gender General Gait Details: Cues for RW management, posture and step length.     Exercises     PT Diagnosis:    PT Problem List:   PT Treatment Interventions:     PT Goals (current goals can now be found in the care plan section)    Visit Information  Last PT Received On: 05/05/13 Assistance Needed: +1 History of Present Illness: 80 y.o. s/p Lumbar four-five Posterior lumbar interbody fusion with pedicle screw fixation using Stealth Navigation and O arm (N/A)    Subjective Data      Cognition  Cognition Arousal/Alertness: Awake/alert Behavior During Therapy: WFL for tasks assessed/performed Overall Cognitive Status: Impaired/Different from baseline Area of Impairment: Following commands;Awareness;Safety/judgement Memory: Decreased recall of precautions Following Commands: Follows one step commands with increased time General Comments: Patient with some increased sleepiness and drowsy. Wife states from pain meds. Requires increased time to focus on task.     Balance     End of Session PT - End of Session Equipment Utilized During Treatment: Gait belt;Back brace Activity Tolerance: Patient tolerated treatment well;Patient limited by fatigue Patient left: in chair;with call bell/phone within reach Nurse Communication: Mobility status   GP     [name] 05/05/2013, 11:41 AM 05/05/2013 [name] PTA [phone] pager [phone] office

## 2013-05-05 NOTE — Progress Notes (Signed)
Subjective: Patient reports Pain seems to be improving slowly. Patient is gradually becoming more ambulatory. May need substantial help at home and may require help of skilled nursing facility for a period of time.  Objective: Vital signs in last 24 hours: Temp:  [97.9 F (36.6 C)-99.4 F (37.4 C)] 98.8 F (37.1 C) (01/23 1808) Pulse Rate:  [75-97] 93 (01/23 1808) Resp:  [18-20] 20 (01/23 1808) BP: (91-121)/(44-61) 114/61 mmHg (01/23 1808) SpO2:  [95 %-96 %] 95 % (01/23 1808) Weight:  [81.149 kg (178 lb 14.4 oz)] 81.149 kg (178 lb 14.4 oz) (01/22 2000)  Intake/Output from previous day: 01/22 0701 - 01/23 0700 In: 440 [P.O.:440] Out: 400 [Urine:400] Intake/Output this shift:    Incision is clean and dry motor function appears intact in both lower extremities  Lab Results:  Recent Labs  05/03/13 0422  WBC 6.8  HGB 11.0*  HCT 31.8*  PLT 129*   BMET  Recent Labs  05/03/13 0422  NA 141  K 3.8  CL 102  CO2 25  GLUCOSE 174*  BUN 17  CREATININE 0.79  CALCIUM 8.1*    Studies/Results: No results found.  Assessment/Plan: Stable postop. Slow progression.  LOS: 3 days  PT OT social services to address whether skilled nursing facility is necessary.   Edwina Grossberg J 05/05/2013, 7:05 PM

## 2013-05-06 ENCOUNTER — Inpatient Hospital Stay (HOSPITAL_COMMUNITY): Payer: Medicare Other

## 2013-05-06 LAB — CBC WITH DIFFERENTIAL/PLATELET
Basophils Absolute: 0 10*3/uL (ref 0.0–0.1)
Basophils Relative: 0 % (ref 0–1)
EOS PCT: 1 % (ref 0–5)
Eosinophils Absolute: 0.1 10*3/uL (ref 0.0–0.7)
HCT: 32 % — ABNORMAL LOW (ref 39.0–52.0)
HEMOGLOBIN: 11.2 g/dL — AB (ref 13.0–17.0)
LYMPHS ABS: 1 10*3/uL (ref 0.7–4.0)
Lymphocytes Relative: 13 % (ref 12–46)
MCH: 30.4 pg (ref 26.0–34.0)
MCHC: 35 g/dL (ref 30.0–36.0)
MCV: 86.7 fL (ref 78.0–100.0)
MONOS PCT: 9 % (ref 3–12)
Monocytes Absolute: 0.7 10*3/uL (ref 0.1–1.0)
NEUTROS PCT: 77 % (ref 43–77)
Neutro Abs: 6.2 10*3/uL (ref 1.7–7.7)
PLATELETS: 229 10*3/uL (ref 150–400)
RBC: 3.69 MIL/uL — ABNORMAL LOW (ref 4.22–5.81)
RDW: 12.6 % (ref 11.5–15.5)
WBC: 8 10*3/uL (ref 4.0–10.5)

## 2013-05-06 LAB — URINALYSIS, ROUTINE W REFLEX MICROSCOPIC
Bilirubin Urine: NEGATIVE
Glucose, UA: NEGATIVE mg/dL
Ketones, ur: 15 mg/dL — AB
Nitrite: POSITIVE — AB
Protein, ur: NEGATIVE mg/dL
SPECIFIC GRAVITY, URINE: 1.016 (ref 1.005–1.030)
Urobilinogen, UA: 0.2 mg/dL (ref 0.0–1.0)
pH: 5.5 (ref 5.0–8.0)

## 2013-05-06 LAB — GLUCOSE, CAPILLARY
GLUCOSE-CAPILLARY: 136 mg/dL — AB (ref 70–99)
GLUCOSE-CAPILLARY: 160 mg/dL — AB (ref 70–99)
GLUCOSE-CAPILLARY: 129 mg/dL — AB (ref 70–99)
GLUCOSE-CAPILLARY: 163 mg/dL — AB (ref 70–99)

## 2013-05-06 LAB — URINE MICROSCOPIC-ADD ON

## 2013-05-06 NOTE — Progress Notes (Signed)
Patient ID: [name], male   DOB: 01-17-1932, 80 y.o.   MRN: [id number] Subjective:  the patient is alert and pleasant. He is having some urinary retention and what sounds like overflow incontinence. He is in no apparent distress.he complains of some left leg pain. There is no swelling.  Objective: Vital signs in last 24 hours: Temp:  [98.1 F (36.7 C)-101.2 F (38.4 C)] 98.1 F (36.7 C) (01/24 0534) Pulse Rate:  [77-93] 85 (01/24 0534) Resp:  [20] 20 (01/24 0534) BP: (114-117)/(43-61) 115/55 mmHg (01/24 0534) SpO2:  [90 %-95 %] 92 % (01/24 0534)  Intake/Output from previous Andrew: 01/23 0701 - 01/24 0700 In: 840 [P.O.:840] Out: 800 [Urine:800] Intake/Output this shift: Total I/O In: -  Out: 203 [Urine:201; Stool:2]  Physical exam the patient is alert and oriented. His dressing is clean and dry. He is moving his lower extremities well.  Lab Results: No results found for this basename: WBC, HGB, HCT, PLT,  in the last 72 hours BMET No results found for this basename: NA, K, CL, CO2, GLUCOSE, BUN, CREATININE, CALCIUM,  in the last 72 hours  Studies/Results: No results found.  Assessment/Plan: Postop Andrew #4: We will continue to mobilize the patient with PT OT.  Urinary retention: We will do a bladder scan to check a post void residual with his next micturition. He may need to have the Foley replaced.  Low-grade fever: We will culture.   LOS: 4 days     Lydon Vansickle D 05/06/2013, 11:06 AM

## 2013-05-06 NOTE — Progress Notes (Addendum)
Occupational Therapy Treatment Patient Details Name: [name] MRN: [id number] DOB: [birth year] Today's Date: 05/06/2013 Time:  -     OT Assessment / Plan / Recommendation  History of present illness 80 y.o. s/p Lumbar four-five Posterior lumbar interbody fusion with pedicle screw fixation using Stealth Navigation and O arm (N/A)   OT comments  Pt. Awake and agreeable to participation in skilled ot.  Able to perform bed mobility mod a.  And all aspects of toileting with min a.  Cont. To require inst. Cues for tech. And hand placement with use of walker and sequencing of safe transfers and functional movements.  dtr reports wife has tendency to "micro manage" pt. And that it is hindering progress.  Ie: the recent refusals she feels were driven by his wives opinions/choices not the pt.s wanted therapy to talk with wife.  i encouraged pts dtr. To review how well the pt. Did today and explain the pt. Has to be given the opportunity to try or he will not progress.    Follow Up Recommendations  Home health OT;Supervision/Assistance - 24 hour           Equipment Recommendations  3 in 1 bedside comode        Frequency Min 2X/week   Progress towards OT Goals Progress towards OT goals: Progressing toward goals  Plan Discharge plan remains appropriate    Precautions / Restrictions Precautions Precautions: Back;Fall Precaution Comments: Reviewed precautions with pt and dtr present Required Braces or Orthoses: Spinal Brace Spinal Brace: Lumbar corset;Applied in sitting position   Pertinent Vitals/Pain 6/10, requested repositioning    ADL  Upper Body Dressing: Performed;Minimal assistance Where Assessed - Upper Body Dressing: Unsupported sitting Toilet Transfer: Performed;Minimal assistance Toilet Transfer Method: Sit to stand Toilet Transfer Equipment: Raised toilet seat with arms (or 3-in-1 over toilet) Toileting - Clothing Manipulation and Hygiene: Performed;Supervision/safety Where  Assessed - Toileting Clothing Manipulation and Hygiene: Sit on 3-in-1 or toilet Transfers/Ambulation Related to ADLs: cues to push through b ues when amb. ADL Comments: able to don brace with min a, and min inst. cues for velcro sequencing.  toileting wtih min a.  pt. incontinent with sit/stand. reports prostate issues and having urgency issues. dtr. states it happened last night also.  reviewed urinal use as an option and also rec. reporting to rn      OT Goals(current goals can now be found in the care plan section)    Visit Information  Last OT Received On: 05/06/13 History of Present Illness: 80 y.o. s/p Lumbar four-five Posterior lumbar interbody fusion with pedicle screw fixation using Stealth Navigation and O arm (N/A)    Subjective Data   "i really do value what you are saying i just take a little while to process information due to how old i am and my hearing"          Cognition  Cognition Arousal/Alertness: Awake/alert Behavior During Therapy: WFL for tasks assessed/performed Overall Cognitive Status: Impaired/Different from baseline Area of Impairment: Following commands;Awareness;Safety/judgement Memory: Decreased recall of precautions Following Commands: Follows one step commands with increased time General Comments: states it "takes him a little while longer" to process info. secondary to his age but states he understands everything just operates on a "delay" also atributed to hearing loss and not always wearing his hearing aides    Mobility     completed bed mobility with hob flat and height of bed at home.  Mod a to bring trunk upright as bles lowered  off of bed.   Inst. Cues for pushing through b ues and b les during sit/stand as pt. Would pause with bles bent at the knees with out cueing.               End of Session OT - End of Session Equipment Utilized During Treatment: Rolling walker;Back brace Activity Tolerance: Patient tolerated treatment well Patient  left: in chair;with call bell/phone within reach;with family/visitor present    Janice Coffin, COTA/L 05/06/2013, 12:47 PM

## 2013-05-06 NOTE — Progress Notes (Signed)
PT Cancellation Note  Patient Details Name: [name] MRN: [id number] DOB: 1931/12/14   Cancelled Treatment:    Reason Eval/Treat Not Completed: Pt politely declining therapy at this time due to just returning back to bed & requesting therapist to return back later today.  Told pt that this therapist will be unable to return this afternoon but will see if another one can check on him.      Sarajane Marek, [place] [phone] 05/06/2013

## 2013-05-07 LAB — GLUCOSE, CAPILLARY
GLUCOSE-CAPILLARY: 125 mg/dL — AB (ref 70–99)
Glucose-Capillary: 136 mg/dL — ABNORMAL HIGH (ref 70–99)
Glucose-Capillary: 149 mg/dL — ABNORMAL HIGH (ref 70–99)
Glucose-Capillary: 163 mg/dL — ABNORMAL HIGH (ref 70–99)

## 2013-05-07 NOTE — Progress Notes (Signed)
Physical Therapy Treatment Patient Details Name: [name] MRN: [id number] DOB: [birth year] Today's Date: 05/07/2013 Time: 1202-1227 PT Time Calculation (min): 25 min  PT Assessment / Plan / Recommendation  History of Present Illness 80 y.o. s/p Lumbar four-five Posterior lumbar interbody fusion with pedicle screw fixation using Stealth Navigation and O arm (N/A)   PT Comments   Patient making very slow progress with mobility/gait. Continues to have difficulty integrating back precautions into functional mobility.  Difficulty with bed mobility and transfers.  Unable to attempt stairs today.  Wife apprehensive about being able to assist patient at home.  Recommend Inpatient Rehab consult for more comprehensive therapies to return patient to mod I level of function.  Follow Up Recommendations  CIR;Supervision/Assistance - 24 hour     Does the patient have the potential to tolerate intense rehabilitation     Barriers to Discharge        Equipment Recommendations  Rolling walker with 5" wheels;3in1 (PT)    Recommendations for Other Services Rehab consult  Frequency Min 5X/week   Progress towards PT Goals Progress towards PT goals: Not progressing toward goals - comment (Very slow progress)  Plan Discharge plan needs to be updated    Precautions / Restrictions Precautions Precautions: Back;Fall Precaution Comments: Patient able to state 3/3 back precautions.  Not integrated into mobility. Required Braces or Orthoses: Spinal Brace Spinal Brace: Lumbar corset;Applied in sitting position Restrictions Weight Bearing Restrictions: No   Pertinent Vitals/Pain Pain impacting mobility    Mobility  Transfers Overall transfer level: Needs assistance Equipment used: Rolling walker (2 wheeled) Transfers: Sit to/from Stand Sit to Stand: Mod assist General transfer comment: Verbal reminders for hand placement to stand from 3-N-1.  Min assist to rise to standing and for balance.  Mod assist  to control descent into lower chair, with cues for hand placement. Ambulation/Gait Ambulation/Gait assistance: Min assist Ambulation Distance (Feet): 120 Feet Assistive device: Rolling walker (2 wheeled) Gait Pattern/deviations: Step-through pattern;Decreased stride length;Trunk flexed;Wide base of support Gait velocity: decreased General Gait Details: Verbal cues needed for safe use of RW.  Repeated cues for step placement - patient walking too far into RW, causing loss of balance posteriorly.  Assist for safety and balance.  Patient with increase in pain during gait, and fatigues quickly. Stairs:  (No - patient unable due to pain/fatigue)      PT Goals (current goals can now be found in the care plan section)    Visit Information  Last PT Received On: 05/07/13 Assistance Needed: +1 History of Present Illness: 80 y.o. s/p Lumbar four-five Posterior lumbar interbody fusion with pedicle screw fixation using Stealth Navigation and O arm (N/A)    Subjective Data  Subjective: "I can't roll over in bed or get in and out of bed.  And I'm not able to clean myself after the bathroom"   Cognition  Cognition Arousal/Alertness: Awake/alert Behavior During Therapy: WFL for tasks assessed/performed Overall Cognitive Status: Impaired/Different from baseline Area of Impairment: Following commands;Awareness;Safety/judgement Memory: Decreased recall of precautions Following Commands: Follows one step commands with increased time Safety/Judgement: Decreased awareness of deficits;Decreased awareness of safety General Comments: Decreased safety awareness related to back precaution.    Balance     End of Session PT - End of Session Equipment Utilized During Treatment: Gait belt;Back brace Activity Tolerance: Patient limited by pain;Patient limited by fatigue Patient left: in chair;with call bell/phone within reach;with family/visitor present Nurse Communication: Mobility status (Need for  post-acute inpatient therapy)   GP  Despina Pole 05/07/2013, 1:12 PM Carita Pian. Sanjuana Kava, [place] Pager [phone]

## 2013-05-07 NOTE — Progress Notes (Signed)
No issues overnight. Pt reports improvement in ability to ambulate with walker, still unsteady. Reports overall improvement in preoperative pain symptoms.  EXAM:  BP 135/69  Pulse 80  Temp(Src) 98.2 F (36.8 C) (Oral)  Resp 18  Ht 5\' 5"  (1.651 m)  Wt 81.149 kg (178 lb 14.4 oz)  BMI 29.77 kg/m2  SpO2 93%  Awake, alert, oriented  Good strength BLE Wound c/d/i  IMAGING: CXR normal  IMPRESSION:  80 y.o. male POD# 5 s/p L4-5 PLIF, neurologically intact, progressing slowly postop  PLAN: - Cont to mobilize with PT - Remains afebrile x 24 hrs. Will f/u Urine cx - Cont foley today for retention, can attempt to d/c and trial of void tomorrow.

## 2013-05-07 NOTE — Progress Notes (Signed)
Rehab Admissions Coordinator Note:  Patient was screened by Cleatrice Burke for appropriateness for an Inpatient Acute Rehab Consult.  At this time, we are recommending [name]. [employer] Medicare will not approve inpt rehab admission for this diagnosis.   Cleatrice Burke 05/07/2013, 5:45 PM  I can be reached at [phone].

## 2013-05-08 ENCOUNTER — Inpatient Hospital Stay (HOSPITAL_COMMUNITY): Payer: Medicare Other

## 2013-05-08 LAB — URINE CULTURE: Colony Count: >=100000

## 2013-05-08 LAB — GLUCOSE, CAPILLARY
GLUCOSE-CAPILLARY: 152 mg/dL — AB (ref 70–99)
GLUCOSE-CAPILLARY: 117 mg/dL — AB (ref 70–99)
Glucose-Capillary: 150 mg/dL — ABNORMAL HIGH (ref 70–99)
Glucose-Capillary: 142 mg/dL — ABNORMAL HIGH (ref 70–99)

## 2013-05-08 MED ORDER — SULFAMETHOXAZOLE-TMP DS 800-160 MG PO TABS
1.0000 | ORAL_TABLET | Freq: Two times a day (BID) | ORAL | Status: DC
  Administered 2013-05-08 – 2013-05-10 (×5): 1 via ORAL
  Filled 2013-05-08 (×6): qty 1

## 2013-05-08 NOTE — Progress Notes (Signed)
Subjective: Patient reports Abdominal distention. Minimal bowel movements. Some diarrhea. Walking improving slowly.  Objective: Vital signs in last 24 hours: Temp:  [97.5 F (36.4 C)-99 F (37.2 C)] 97.5 F (36.4 C) (01/26 1037) Pulse Rate:  [54-88] 66 (01/26 1037) Resp:  [18-20] 18 (01/26 1037) BP: (109-153)/(49-70) 153/70 mmHg (01/26 1037) SpO2:  [94 %-99 %] 99 % (01/26 1037)  Intake/Output from previous day: 01/25 0701 - 01/26 0700 In: -  Out: 1302 [Urine:1300; Stool:2] Intake/Output this shift: Total I/O In: 580 [P.O.:580] Out: -   Incision is clean and dry motor function appears intact the abdomen is distended bowel sounds present  Lab Results:  Recent Labs  05/06/13 1130  WBC 8.0  HGB 11.2*  HCT 32.0*  PLT 229   BMET No results found for this basename: NA, K, CL, CO2, GLUCOSE, BUN, CREATININE, CALCIUM,  in the last 72 hours  Studies/Results: Dg Chest Port 1 View  05/06/2013   CLINICAL DATA:  Postop from lumbar spine surgery.  Fever.  EXAM: PORTABLE CHEST - 1 VIEW  COMPARISON:  04/25/2013  FINDINGS: The heart size and mediastinal contours are within normal limits. Both lungs are clear. The visualized skeletal structures are unremarkable.  IMPRESSION: No active disease.   Electronically Signed   By: Earle Gell M.D.   On: 05/06/2013 16:35    Assessment/Plan: Stable postop.  LOS: 6 days  Discussed discharge planning with patient. His daughter can come and stay with him for a few days. Home health physical therapy and occupational therapy can do safety assessments. Will obtain abdominal flat plate to see if he has a significant ileus. Possible discharge this afternoon.   Minh Roanhorse J 05/08/2013, 11:27 AM

## 2013-05-08 NOTE — Progress Notes (Signed)
Physical Therapy Treatment Patient Details Name: [name] MRN: [id number] DOB: 1931-07-15 Today's Date: 05/08/2013 Time: 2878-6767 PT Time Calculation (min): 24 min  PT Assessment / Plan / Recommendation  History of Present Illness 80 y.o. s/p Lumbar four-five Posterior lumbar interbody fusion with pedicle screw fixation using Stealth Navigation and O arm (N/A)   PT Comments   Patient made good gains with mobility and gait today.  Able to complete bed mobility with supervision and verbal reminders.  Able to complete stairs with min guard assist.  Daughter coming from [facility] to stay with patient to assist wife with care.  Feel with this additional support and patient's improvements, he can safely return home with f/u HHPT.  Follow Up Recommendations  Home health PT;Supervision/Assistance - 24 hour     Does the patient have the potential to tolerate intense rehabilitation     Barriers to Discharge        Equipment Recommendations  Rolling walker with 5" wheels;3in1 (PT)    Recommendations for Other Services    Frequency Min 5X/week   Progress towards PT Goals Progress towards PT goals: Progressing toward goals  Plan Discharge plan needs to be updated    Precautions / Restrictions Precautions Precautions: Back;Fall Precaution Booklet Issued: Yes (comment) Precaution Comments: Reviewed precautions Required Braces or Orthoses: Spinal Brace Spinal Brace: Lumbar corset;Applied in sitting position Restrictions Weight Bearing Restrictions: No   Pertinent Vitals/Pain     Mobility  Bed Mobility Overal bed mobility: Modified Independent Bed Mobility: Rolling;Sidelying to Sit;Sit to Sidelying Rolling: Modified independent (Device/Increase time) (with rail) Sidelying to sit: Supervision Sit to sidelying: Supervision General bed mobility comments: Verbal cues for safe technique. Patient able to demonstrate correct technique for bed mobility maintaining back precautions.  Supervision  for safety. Transfers Overall transfer level: Needs assistance Equipment used: Rolling walker (2 wheeled) Transfers: Sit to/from Stand Sit to Stand: Min guard General transfer comment: Verbal cues for hand placement.  Assist for safety only. Ambulation/Gait Ambulation/Gait assistance: Min guard Ambulation Distance (Feet): 200 Feet Assistive device: Rolling walker (2 wheeled) Gait Pattern/deviations: Step-through pattern;Decreased stride length Gait velocity: decreased General Gait Details: Verbal cues to roll RW - not lift it with each step.  Balance improved today.  Assist for safety/balance. Stairs: Yes Stairs assistance: Min guard Stair Management: One rail Left;Step to pattern;Sideways Number of Stairs: 3 (Practiced x2) General stair comments: Provided patient and wife instructions to negotiate steps with one rail sideways.  Patient required min guard assist for safety only.      PT Goals (current goals can now be found in the care plan section)    Visit Information  Last PT Received On: 05/08/13 Assistance Needed: +1 History of Present Illness: 80 y.o. s/p Lumbar four-five Posterior lumbar interbody fusion with pedicle screw fixation using Stealth Navigation and O arm (N/A)    Subjective Data  Subjective: 'I'm moving better today"   Cognition  Cognition Arousal/Alertness: Awake/alert Behavior During Therapy: WFL for tasks assessed/performed Overall Cognitive Status: Within Functional Limits for tasks assessed Area of Impairment: Following commands;Awareness;Safety/judgement    Balance     End of Session PT - End of Session Equipment Utilized During Treatment: Gait belt;Back brace Activity Tolerance: Patient tolerated treatment well Patient left: in bed;with call bell/phone within reach;with family/visitor present Nurse Communication: Mobility status   GP     Despina Pole 05/08/2013, 3:42 PM Carita Pian. Sanjuana Kava, [place] Pager [phone]

## 2013-05-08 NOTE — Progress Notes (Signed)
Catheter removed at 1300, 2 voids recorded ~50cc each. No obvious distress. Encourage fluid intake.

## 2013-05-08 NOTE — Progress Notes (Signed)
80 yo who is s/p PLIF. A urine culture was done the other day to r/o infection. The UA does look dirty. Paged Dr. Ellene Route today. Ok to treat kleb UTI with septra.   Septra DS 1 PO BID

## 2013-05-08 NOTE — Progress Notes (Signed)
Occupational Therapy Treatment Patient Details Name: [name] MRN: [id number] DOB: 12/18/1931 Today's Date: 05/08/2013 Time: 1120-1204 OT Time Calculation (min): 44 min  OT Assessment / Plan / Recommendation  History of present illness 80 y.o. s/p Lumbar four-five Posterior lumbar interbody fusion with pedicle screw fixation using Stealth Navigation and O arm (N/A)   OT comments  Pt. Was ed. On use of AE for ADLs. Pt. And wife ed. On use of toilet aid for hygein. Pt. Was able to verbalize and follow back precautions. Pt. And wife ed. On proper transfer into tub. Pt. And wife are comfortable with d/c home and dtr. Plans to come to [facility].   Follow Up Recommendations  Home health OT;Supervision/Assistance - 24 hour    Barriers to Discharge       Equipment Recommendations       Recommendations for Other Services    Frequency     Progress towards OT Goals Progress towards OT goals: Progressing toward goals  Plan Discharge plan remains appropriate    Precautions / Restrictions Precautions Precautions: Back;Fall Precaution Booklet Issued: Yes (comment) Precaution Comments: Patient able to state 3/3 back precautions.  Not integrated into mobility. Required Braces or Orthoses: Spinal Brace Spinal Brace: Lumbar corset;Applied in sitting position Restrictions Weight Bearing Restrictions: No   Pertinent Vitals/Pain No c/o    ADL  Grooming: Set up;Supervision/safety Where Assessed - Grooming: Unsupported standing Upper Body Bathing: Simulated;Use of adaptive equipment;Supervision/safety Where Assessed - Upper Body Bathing: Unsupported standing Lower Body Bathing: Simulated;Supervision/safety Where Assessed - Lower Body Bathing: Unsupported standing Toilet Transfer: Performed;Min guard Toilet Transfer Method: Sit to Loss adjuster, chartered: Raised toilet seat with arms (or 3-in-1 over toilet) Toileting - Clothing Manipulation and Hygiene: Performed;Supervision/safety Where  Assessed - Toileting Clothing Manipulation and Hygiene: Sit on 3-in-1 or toilet ADL Comments:  (Pt. and wife ed. on safe tub transfer )    OT Diagnosis:    OT Problem List:   OT Treatment Interventions:     OT Goals(current goals can now be found in the care plan section)    Visit Information  Last OT Received On: 05/08/13 Assistance Needed: +1 History of Present Illness: 80 y.o. s/p Lumbar four-five Posterior lumbar interbody fusion with pedicle screw fixation using Stealth Navigation and O arm (N/A)    Subjective Data      Prior Functioning       Cognition  Cognition Arousal/Alertness: Awake/alert Behavior During Therapy: WFL for tasks assessed/performed Overall Cognitive Status: Impaired/Different from baseline Area of Impairment: Following commands;Awareness;Safety/judgement    Mobility  Bed Mobility Overal bed mobility: Modified Independent Transfers Overall transfer level: Needs assistance Equipment used: Rolling walker (2 wheeled) Transfers: Sit to/from Stand Sit to Stand: Min guard General transfer comment: Verbal reminders for hand placement to stand from 3-N-1.  Min assist to rise to standing and for balance.  Mod assist to control descent into lower chair, with cues for hand placement.    Exercises      Balance    End of Session    GO     [name] 05/08/2013, 12:05 PM

## 2013-05-08 NOTE — Progress Notes (Signed)
Talked to patient with spouse present; [place] choices offered, patient chose [facility]; Juliann Pulse RN with [facility] called for arrangements; Mindi Slicker RN,BSN,MHA [phone]

## 2013-05-08 NOTE — Clinical Social Work Note (Signed)
CSW met with pt and pt's wife at bedside. CSW discussed PT recommendation for SNF placement at time of discharge. Pt stated that his daughter was coming from [place] to come and stay with him and help take care of him at home. Pt stated that he prefers to go home rather than SNF. CSW consulted with RNCM who is to speak with pt and pt's wife regarding home health options. CSW signing off. Please re consult if discharge disposition changes.  Pati Gallo, [place] Social Worker [phone]

## 2013-05-09 LAB — GLUCOSE, CAPILLARY
GLUCOSE-CAPILLARY: 130 mg/dL — AB (ref 70–99)
GLUCOSE-CAPILLARY: 137 mg/dL — AB (ref 70–99)
Glucose-Capillary: 142 mg/dL — ABNORMAL HIGH (ref 70–99)
Glucose-Capillary: 153 mg/dL — ABNORMAL HIGH (ref 70–99)

## 2013-05-09 NOTE — Progress Notes (Signed)
Physical Therapy Treatment Patient Details Name: [name] MRN: [id number] DOB: 03/25/1932 Today's Date: 05/09/2013 Time: 0160-1093 PT Time Calculation (min): 26 min  PT Assessment / Plan / Recommendation  History of Present Illness 80 y.o. s/p Lumbar four-five Posterior lumbar interbody fusion with pedicle screw fixation using Stealth Navigation and O arm (N/A)   PT Comments   Patient progressing well since early last week. Planning to DC home today. Did not want to attempt stairs again and felt safe with practice yesterday. Patient and wife were educated on home set up and use of AD.   Follow Up Recommendations  Home health PT;Supervision/Assistance - 24 hour     Does the patient have the potential to tolerate intense rehabilitation     Barriers to Discharge        Equipment Recommendations  Rolling walker with 5" wheels;3in1 (PT)    Recommendations for Other Services    Frequency     Progress towards PT Goals Progress towards PT goals: Progressing toward goals  Plan Current plan remains appropriate    Precautions / Restrictions Precautions Precautions: Back;Fall Precaution Comments: Reviewed precautions Required Braces or Orthoses: Spinal Brace Spinal Brace: Lumbar corset;Applied in sitting position   Pertinent Vitals/Pain no apparent distress     Mobility  Transfers Equipment used: Rolling walker (2 wheeled) Sit to Stand: Supervision General transfer comment: Verbal cues for hand placement. Ambulation/Gait Ambulation/Gait assistance: Supervision Ambulation Distance (Feet): 300 Feet Assistive device: Rolling walker (2 wheeled) Gait Pattern/deviations: Step-through pattern General Gait Details: Verbal cues to roll RW - not lift it with each step.     Exercises     PT Diagnosis:    PT Problem List:   PT Treatment Interventions:     PT Goals (current goals can now be found in the care plan section)    Visit Information  Last PT Received On:  05/09/13 Assistance Needed: +1 History of Present Illness: 80 y.o. s/p Lumbar four-five Posterior lumbar interbody fusion with pedicle screw fixation using Stealth Navigation and O arm (N/A)    Subjective Data      Cognition  Cognition Arousal/Alertness: Awake/alert Behavior During Therapy: WFL for tasks assessed/performed Overall Cognitive Status: Within Functional Limits for tasks assessed    Balance     End of Session PT - End of Session Equipment Utilized During Treatment: Gait belt;Back brace Activity Tolerance: Patient tolerated treatment well Patient left: with call bell/phone within reach;with family/visitor present;in chair Nurse Communication: Mobility status   GP     [name] 05/09/2013, 8:33 AM  05/09/2013 [name] PTA [phone] pager [phone] office

## 2013-05-09 NOTE — Progress Notes (Signed)
Pt voided 14ml, PVR showed 480ml. MD on call notified, new orders noted. Will continue to monitor.

## 2013-05-10 LAB — TYPE AND SCREEN
ABO/RH(D): O POS
ANTIBODY SCREEN: POSITIVE
DAT, IGG: NEGATIVE
Donor AG Type: NEGATIVE
Donor AG Type: NEGATIVE
PT AG TYPE: NEGATIVE
Unit division: 0
Unit division: 0

## 2013-05-10 LAB — CLOSTRIDIUM DIFFICILE BY PCR: Toxigenic C. Difficile by PCR: NEGATIVE

## 2013-05-10 LAB — GLUCOSE, CAPILLARY: Glucose-Capillary: 155 mg/dL — ABNORMAL HIGH (ref 70–99)

## 2013-05-10 MED ORDER — METHOCARBAMOL 500 MG PO TABS: 500.0000 mg | ORAL_TABLET | Freq: Four times a day (QID) | ORAL | Status: DC | PRN

## 2013-05-10 MED ORDER — OXYCODONE-ACETAMINOPHEN 5-325 MG PO TABS: 1.0000 | ORAL_TABLET | ORAL | Status: DC | PRN

## 2013-05-10 MED ORDER — SULFAMETHOXAZOLE-TMP DS 800-160 MG PO TABS: 1.0000 | ORAL_TABLET | Freq: Two times a day (BID) | ORAL | Status: DC

## 2013-05-10 NOTE — Plan of Care (Signed)
D/c instructions given to pt and pt spouse, v/u. Med scripts given to pt. Instructed pt how to take meds, v/u. Foley care at home given and demonstrated to pt and pt spouse. Supplies given. Handout on foley care at home given to pt and spouse. Pt is stable to be d/c.

## 2013-05-10 NOTE — Progress Notes (Signed)
Patient ID: [name], male   DOB: [birth year], 80 y.o.   MRN: [id number] Discharge on hold pending resolution of diarrhea and urinary retention.

## 2013-05-10 NOTE — Progress Notes (Signed)
Noted discharge on hold, [place] arranged with [facility] for HHPT/ OT when medically stable for discharge home; Aneta Mins [phone]

## 2013-05-10 NOTE — Progress Notes (Addendum)
Physical Therapy Treatment Patient Details Name: [name] MRN: [id number] DOB: February 24, 1932 Today's Date: 05/10/2013 Time: 9201-0071 PT Time Calculation (min): 26 min  PT Assessment / Plan / Recommendation  History of Present Illness 80 y.o. s/p Lumbar four-five Posterior lumbar interbody fusion with pedicle screw fixation using Stealth Navigation and O arm (N/A)   PT Comments   Walking well. Will sign off on acute PT. Patient planning home soon. All questions answered. Patient can walk with family at this time. Declined stair training again this session  Follow Up Recommendations  Home health PT;Supervision/Assistance - 24 hour     Does the patient have the potential to tolerate intense rehabilitation     Barriers to Discharge        Equipment Recommendations  Rolling walker with 5" wheels;3in1 (PT)    Recommendations for Other Services    Frequency     Progress towards PT Goals Progress towards PT goals: Goals met/education completed, patient discharged from PT  Plan      Precautions / Restrictions Precautions Precautions: Back;Fall Spinal Brace: Lumbar corset;Applied in sitting position Restrictions Weight Bearing Restrictions: No   Pertinent Vitals/Pain Denied pain    Mobility  Bed Mobility Overal bed mobility: Modified Independent Transfers Overall transfer level: Modified independent Ambulation/Gait Ambulation/Gait assistance: Modified independent (Device/Increase time) Ambulation Distance (Feet): 600 Feet Assistive device: Rolling walker (2 wheeled) Gait velocity interpretation: at or above normal speed for age/gender    Exercises     PT Diagnosis:    PT Problem List:   PT Treatment Interventions:     PT Goals (current goals can now be found in the care plan section)    Visit Information  Last PT Received On: 05/10/13 Assistance Needed: +1 History of Present Illness: 80 y.o. s/p Lumbar four-five Posterior lumbar interbody fusion with pedicle screw  fixation using Stealth Navigation and O arm (N/A)    Subjective Data      Cognition  Cognition Arousal/Alertness: Awake/alert Behavior During Therapy: WFL for tasks assessed/performed Overall Cognitive Status: Within Functional Limits for tasks assessed    Balance     End of Session PT - End of Session Equipment Utilized During Treatment: Gait belt;Back brace Activity Tolerance: Patient tolerated treatment well Patient left: with call bell/phone within reach;with family/visitor present;in chair Nurse Communication: Mobility status   GP     [name] 05/10/2013, 9:13 AM  05/10/2013 [name] PTA [phone] pager [phone] office

## 2013-05-10 NOTE — Progress Notes (Signed)
Agree with sign off. All goals met. [place], [place], [place]

## 2013-05-10 NOTE — Discharge Summary (Signed)
Physician Discharge Summary  Patient ID: [name] MRN: [id number] DOB/AGE: 19-Oct-1931 80 y.o.  Admit date: 05/02/2013 Discharge date: 05/10/2013  Admission Diagnoses: Spondylolisthesis L4-L5 with severe stenosis, cauda equina syndrome  Discharge Diagnoses: Spondylolisthesis L4-L5 with severe stenosis, cauda equina syndrome. Acute blood loss anemia. Urinary retention. Urinary tract infection, chronic.  Active Problems:   Spondylolisthesis of lumbar region   Discharged Condition: good  Hospital Course: Patient is a 80 year old individual who has had problems with urinary incontinence back pain and lower extremity weakness. He has severe stenosis at L4-L5 with cauda equina syndrome. He was admitted to undergo surgery to decompress and stabilize his lumbar spine. He tolerated the surgery well however it was apparent that the patient had chronic urinary tract infection on his arrival. He was started on Septra. He cannot tolerate having the catheter removed as he had substantial urinary retention. He is discharged home with a urinary catheter. He'll have a followup visit with the urologist on an outpatient basis.  Consults: None  Significant Diagnostic Studies: None  Treatments: surgery: Laminectomy and decompression L4-L5. Pedicle screw fixation L4-L5 with fusion.  Discharge Exam: Blood pressure 126/60, pulse 55, temperature 98.5 F (36.9 C), temperature source Oral, resp. rate 18, height 5\' 5"  (1.651 m), weight 81.149 kg (178 lb 14.4 oz), SpO2 94.00%. Incision is clean and dry, motor function is intact in both lower extremities.  Disposition: 01-Home or Self Care  Discharge Orders   Future Orders Complete By Expires   Call MD for:  redness, tenderness, or signs of infection (pain, swelling, redness, odor or green/yellow discharge around incision site)  As directed    Call MD for:  severe uncontrolled pain  As directed    Call MD for:  temperature >100.4  As directed    Care  order/instruction  As directed    Scheduling Instructions:     Home foley care   Diet - low sodium heart healthy  As directed    Discharge instructions  As directed    Comments:     Okay to shower. Do not apply salves or appointments to incision. No heavy lifting with the upper extremities greater than 15 pounds. May resume driving when not requiring pain medication and patient feels comfortable with doing so.   Increase activity slowly  As directed        Medication List    STOP taking these medications       meloxicam 15 MG tablet  Commonly known as:  MOBIC      TAKE these medications       aspirin 81 MG EC tablet  Take 81 mg by mouth daily.     diazepam 2 MG tablet  Commonly known as:  VALIUM  Take 1 tablet (2 mg total) by mouth at bedtime.     finasteride 5 MG tablet  Commonly known as:  PROSCAR  Take 5 mg by mouth daily.     FREESTYLE LITE TEST VI     furosemide 40 MG tablet  Commonly known as:  LASIX  Take 1 tablet (40 mg total) by mouth daily.     metFORMIN 1000 MG tablet  Commonly known as:  GLUCOPHAGE  Take 1 tablet (1,000 mg total) by mouth 2 (two) times daily with a meal.     methocarbamol 500 MG tablet  Commonly known as:  ROBAXIN  Take 1 tablet (500 mg total) by mouth every 6 (six) hours as needed for muscle spasms.     metoprolol tartrate  25 MG tablet  Commonly known as:  LOPRESSOR  Take 25 mg by mouth 2 (two) times daily.     oxyCODONE-acetaminophen 5-325 MG per tablet  Commonly known as:  PERCOCET/ROXICET  Take 1-2 tablets by mouth every 4 (four) hours as needed for moderate pain.     sitaGLIPtin 100 MG tablet  Commonly known as:  JANUVIA  Take 100 mg by mouth daily.     sulfamethoxazole-trimethoprim 800-160 MG per tablet  Commonly known as:  BACTRIM DS  Take 1 tablet by mouth every 12 (twelve) hours.     tamsulosin 0.4 MG Caps capsule  Commonly known as:  FLOMAX  Take 0.4 mg by mouth daily after supper.      valsartan-hydrochlorothiazide 160-12.5 MG per tablet  Commonly known as:  DIOVAN HCT  Take 1 tablet by mouth daily.         SignedEarleen Newport 05/10/2013, 9:49 AM

## 2013-05-12 LAB — CULTURE, BLOOD (ROUTINE X 2)
Culture: NO GROWTH
Culture: NO GROWTH

## 2013-05-15 ENCOUNTER — Telehealth: Payer: Self-pay

## 2013-05-15 NOTE — Telephone Encounter (Signed)
[name] notified via voicemail.

## 2013-05-15 NOTE — Telephone Encounter (Signed)
Phone call from Nadara Eaton [phone] occupational therapist with [facility] states he saw the patient last week and is requesting an additional visit this week to work on training and transfers, adaptive equipment, and IADL training. Please advise.

## 2013-05-15 NOTE — Telephone Encounter (Signed)
[facility] for additional home health ot/pt visits

## 2013-05-18 ENCOUNTER — Other Ambulatory Visit: Payer: Self-pay

## 2013-05-18 NOTE — Telephone Encounter (Signed)
[facility] for refill on percocet #40. Wife will pick up.

## 2013-05-18 NOTE — Telephone Encounter (Signed)
Phone call from patient 's wife stating patient needs a refill on Percocet. He recently has back surgery.

## 2013-05-19 DIAGNOSIS — M6281 Muscle weakness (generalized): Secondary | ICD-10-CM

## 2013-05-19 DIAGNOSIS — R269 Unspecified abnormalities of gait and mobility: Secondary | ICD-10-CM

## 2013-05-19 DIAGNOSIS — M4310 Spondylolisthesis, site unspecified: Secondary | ICD-10-CM

## 2013-05-19 MED ORDER — OXYCODONE-ACETAMINOPHEN 5-325 MG PO TABS: 1.0000 | ORAL_TABLET | ORAL | Status: DC | PRN

## 2013-05-19 NOTE — Telephone Encounter (Signed)
Script printed and waiting for Dr Linda Hedges signature

## 2013-05-22 ENCOUNTER — Encounter: Payer: Self-pay | Admitting: Internal Medicine

## 2013-05-23 ENCOUNTER — Ambulatory Visit (INDEPENDENT_AMBULATORY_CARE_PROVIDER_SITE_OTHER): Payer: Medicare Other | Admitting: Internal Medicine

## 2013-05-23 ENCOUNTER — Encounter: Payer: Self-pay | Admitting: Internal Medicine

## 2013-05-23 VITALS — BP 158/100 | HR 82 | Temp 97.6°F | Wt 172.6 lb

## 2013-05-23 DIAGNOSIS — M549 Dorsalgia, unspecified: Secondary | ICD-10-CM

## 2013-05-23 DIAGNOSIS — M1990 Unspecified osteoarthritis, unspecified site: Secondary | ICD-10-CM

## 2013-05-23 DIAGNOSIS — R252 Cramp and spasm: Secondary | ICD-10-CM

## 2013-05-23 DIAGNOSIS — N139 Obstructive and reflux uropathy, unspecified: Secondary | ICD-10-CM

## 2013-05-23 DIAGNOSIS — M48061 Spinal stenosis, lumbar region without neurogenic claudication: Secondary | ICD-10-CM

## 2013-05-23 DIAGNOSIS — I10 Essential (primary) hypertension: Secondary | ICD-10-CM

## 2013-05-23 DIAGNOSIS — Z87898 Personal history of other specified conditions: Secondary | ICD-10-CM

## 2013-05-23 DIAGNOSIS — N401 Enlarged prostate with lower urinary tract symptoms: Secondary | ICD-10-CM

## 2013-05-23 DIAGNOSIS — N138 Other obstructive and reflux uropathy: Secondary | ICD-10-CM

## 2013-05-23 DIAGNOSIS — E119 Type 2 diabetes mellitus without complications: Secondary | ICD-10-CM

## 2013-05-23 MED ORDER — MELOXICAM 15 MG PO TABS: 15.0000 mg | ORAL_TABLET | Freq: Every day | ORAL | Status: DC

## 2013-05-23 NOTE — Progress Notes (Signed)
Subjective:    Patient ID: [name], male    DOB: 09-Jun-1931, 80 y.o.   MRN: [id number]  HPI [name] is s/p back surgery for spinal stenosis and has done well. He has been participating in physical therapy - working hard. He does have continued multi-joint pain. He has been off NSAIDs since just before surgery. He has had leg cramps which he associates with hard physical therapy and does admit that this is improved since surgery. Generally has a positive attitude.  He needs to change DM med - Celesta Gentile is no longer covered. He is asked to check his Rx formulary and let me know what has the best coverage.   Past Medical History  Diagnosis Date  . HERPES SIMPLEX INFECTION 01/19/2007  . TINEA PEDIS 11/15/2009  . DIABETES MELLITUS 01/19/2007  . HYPERLIPIDEMIA 01/19/2007  . DEPRESSION, CHRONIC 06/24/2007  . Carpal tunnel syndrome 03/24/2007  . HYPERTENSION 01/19/2007  . Intermediate coronary syndrome 08/29/2008  . CORONARY ARTERY DISEASE 01/19/2007  . URI 01/11/2009  . OSTEOARTHRITIS 06/24/2007  . SHOULDER PAIN, LEFT 03/24/2007  . SPINAL STENOSIS, LUMBAR 06/03/2009  . BACK PAIN, CHRONIC 04/18/2009  . PLANTAR FASCIITIS, RIGHT 01/19/2007  . LEG CRAMPS, NOCTURNAL 10/28/2007  . HEART MURMUR, SYSTOLIC 07/20/8117  . Diarrhea 02/23/2007  . BENIGN PROSTATIC HYPERTROPHY, HX OF 01/19/2007  . PERCUTANEOUS TRANSLUMINAL CORONARY ANGIOPLASTY, HX OF 01/19/2007  . Other postprocedural status(V45.89) 01/19/2007  . DIABETES MELLITUS, TYPE II, WITH NEUROLOGICAL COMPLICATIONS 04/17/7827  . Leg cramps, sleep related 08/05/2010  . OBSTRUCTIVE SLEEP APNEA 01/19/2007    does not use CPAP   Past Surgical History  Procedure Laterality Date  . Arthroscopy, knee of hx    . Plastic joint in thumb    . Inguinal herniorrhapy, hx    . Percutaneous transluminal coronary angioplasty, hx of  2004    Dr. Lyndel Safe  . Ptca/stent  2004  . Tonsillectomy    . Vasectomy    . Colonoscopy    . Cardiac catheterization    . Nasal sinus  surgery     Family History  Problem Relation Age of Onset  . Diabetes Other   . Coronary artery disease Other    History   Social History  . Marital Status: Married    Spouse Name: N/A    Number of Children: N/A  . Years of Education: N/A   Occupational History  . 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 Smoking status: Former Smoker    Quit date: 04/14/1983  . Smokeless tobacco: Never Used     Comment: stopped 1985  . Alcohol Use: Yes     Comment: 2-3 times a week - wine, scotch  . Drug Use: No  . Sexual Activity: Not on file   Other Topics Concern  . Not on file   Social History Narrative   Married in 1959 - 37yrs, divorced; married 1974 - 1.5 years, divorced; married 1985 1 daughter 42; 51 step daughters; 2 grandchildren.    Current Outpatient Prescriptions on File Prior to Visit  Medication Sig Dispense Refill  . aspirin 81 MG EC tablet Take 81 mg by mouth daily.       . finasteride (PROSCAR) 5 MG tablet Take 5 mg by mouth daily.      . furosemide (LASIX) 40 MG tablet Take 1 tablet (40 mg total) by mouth daily.  90 tablet  3  . Glucose Blood (FREESTYLE LITE TEST  VI)       . metFORMIN (GLUCOPHAGE) 1000 MG tablet Take 1 tablet (1,000 mg total) by mouth 2 (two) times daily with a meal.  180 tablet  3  . methocarbamol (ROBAXIN) 500 MG tablet Take 1 tablet (500 mg total) by mouth every 6 (six) hours as needed for muscle spasms.  60 tablet  3  . metoprolol tartrate (LOPRESSOR) 25 MG tablet Take 25 mg by mouth 2 (two) times daily.      Marland Kitchen oxyCODONE-acetaminophen (PERCOCET/ROXICET) 5-325 MG per tablet Take 1-2 tablets by mouth every 4 (four) hours as needed for moderate pain.  40 tablet  0  . sitaGLIPtin (JANUVIA) 100 MG tablet Take 100 mg by mouth daily.      . tamsulosin (FLOMAX) 0.4 MG CAPS capsule Take 0.4 mg by mouth daily after supper.      . valsartan-hydrochlorothiazide (DIOVAN HCT) 160-12.5 MG per tablet Take 1 tablet by mouth  daily.  90 tablet  3   No current facility-administered medications on file prior to visit.      Review of Systems System review is negative for any constitutional, cardiac, pulmonary, GI or neuro symptoms or complaints other than as described in the HPI.     Objective:   Physical Exam Filed Vitals:   05/23/13 1017  BP: 158/100  Pulse: 82  Temp: 97.6 F (36.4 C)   Wt Readings from Last 3 Encounters:  05/23/13 172 lb 9.6 oz (78.291 kg)  05/04/13 178 lb 14.4 oz (81.149 kg)  05/04/13 178 lb 14.4 oz (81.149 kg)   Gen'l- WNWD man in no distress. He is wearing a light-weight back brace. Cor - RRR Pulm - normal respirations. Neuro - awake and alert, cognition is normal. Has normal strength - GU&G is normal. Ambulates w/o assist although his walker is with him.       Assessment & Plan:

## 2013-05-23 NOTE — Progress Notes (Signed)
Pre visit review using our clinic review tool, if applicable. No additional management support is needed unless otherwise documented below in the visit note. 

## 2013-05-23 NOTE — Patient Instructions (Signed)
Good to see you. It is with pain that I retire leaving behind such great folks as y'all. It is fine as of April first to transfer to the NP at [facility] to be your primary care provider.  You have done well with surgery - I think it was definitely the right thing to do.  Other joint pain - we didn't fix it all. It is ok to resume Meloxicam - new Rx sent to [facility].  Diabetes - please check the [place] for the best priced/covered diabetes medication. You can print the page from the formulary and I will be happy to review this.  Bladder function - hopefully your flow will improve and do well on the tamsulosin and Fenesteride.   Thanks for your trust and confidence over the year.

## 2013-05-24 NOTE — Assessment & Plan Note (Signed)
Jan '15: Procedure: L4-L5 decompressive laminectomy decompression of L4 and L5 nerve roots, posterior lumbar interbody arthrodesis with peek spacers local autograft and allograft, pedicle screw fixation L4-L5, posterior lateral arthrodesis L4-L5 using autograft and allograft, neuro- navigation using Stealth. Doing well post op.  Plan F/u with Dr. Ellene Route as instructed.

## 2013-05-24 NOTE — Assessment & Plan Note (Signed)
BP Readings from Last 3 Encounters:  05/23/13 158/100  05/10/13 126/60  05/10/13 126/60   Mild elevation at today's visit but previously well controlled.  Plan Continue present regimen  Follow BP readings - if elevated at next visit will adjust medications.

## 2013-05-24 NOTE — Assessment & Plan Note (Signed)
At the time of back surgery his Meloxicam was stopped. He does report increased pain and stiffness knees, neck.  Plan Resume Meloxicam

## 2013-05-24 NOTE — Assessment & Plan Note (Signed)
During his hospitalization he developed urinary retention and was d/c'd home with foley catheter. This was removed at the time he went to Urology (no note available). He continues to have BPH-LUTS with a slow stream and incomplete emptying.  Plan Continue present medications  For lack of improvement and return to pre-op level of function will revisit urology.

## 2013-05-24 NOTE — Assessment & Plan Note (Signed)
Doing well 1 month post op. No report of complications. He has been doing PT.  Plan Follow up with Dr. Ellene Route as instructed  Continue PT

## 2013-05-24 NOTE — Assessment & Plan Note (Signed)
During his hospitalization he developed urinary retention and was d/c'd home with foley catheter. This was removed at the time he went to Urology (no note available). He continues to have BPH-LUTS with a slow stream and incomplete emptying.  Plan Continue present medications  For lack of improvement and return to pre-op level of function will revisit urology. 

## 2013-05-24 NOTE — Assessment & Plan Note (Signed)
Lab Results  Component Value Date   HGBA1C 6.4 09/28/2012   Plan Will switch to formulary covered agent, preferrably a DPP4  F/u A1C after being on new regimen for 90 days.

## 2013-05-24 NOTE — Assessment & Plan Note (Signed)
Decreased cramping since his back surgery but still remains an occasional problem. He specifically reported cramp in the great toe.  Plan No new therapies.

## 2013-05-25 ENCOUNTER — Telehealth: Payer: Self-pay

## 2013-05-25 DIAGNOSIS — R269 Unspecified abnormalities of gait and mobility: Secondary | ICD-10-CM

## 2013-05-25 DIAGNOSIS — M4310 Spondylolisthesis, site unspecified: Secondary | ICD-10-CM

## 2013-05-25 DIAGNOSIS — M6281 Muscle weakness (generalized): Secondary | ICD-10-CM

## 2013-05-25 NOTE — Telephone Encounter (Signed)
Relevant patient education assigned to patient using Emmi. ° °

## 2013-06-15 ENCOUNTER — Other Ambulatory Visit: Payer: Self-pay | Admitting: Internal Medicine

## 2013-06-15 ENCOUNTER — Other Ambulatory Visit: Payer: Self-pay

## 2013-06-15 MED ORDER — FINASTERIDE 5 MG PO TABS: 5.0000 mg | ORAL_TABLET | Freq: Every day | ORAL | Status: DC

## 2013-06-15 MED ORDER — METOPROLOL TARTRATE 25 MG PO TABS: 25.0000 mg | ORAL_TABLET | Freq: Two times a day (BID) | ORAL | Status: DC

## 2013-06-21 ENCOUNTER — Encounter: Payer: Self-pay | Admitting: Internal Medicine

## 2013-06-21 ENCOUNTER — Other Ambulatory Visit (INDEPENDENT_AMBULATORY_CARE_PROVIDER_SITE_OTHER): Payer: Medicare Other

## 2013-06-21 ENCOUNTER — Ambulatory Visit (INDEPENDENT_AMBULATORY_CARE_PROVIDER_SITE_OTHER): Payer: Medicare Other | Admitting: Internal Medicine

## 2013-06-21 VITALS — BP 156/92 | HR 66 | Temp 97.0°F | Wt 172.4 lb

## 2013-06-21 DIAGNOSIS — E119 Type 2 diabetes mellitus without complications: Secondary | ICD-10-CM

## 2013-06-21 LAB — HEMOGLOBIN A1C: HEMOGLOBIN A1C: 6 % (ref 4.6–6.5)

## 2013-06-21 MED ORDER — TRAMADOL HCL 50 MG PO TABS: 50.0000 mg | ORAL_TABLET | Freq: Three times a day (TID) | ORAL | Status: DC | PRN

## 2013-06-21 NOTE — Patient Instructions (Signed)
Thanks for bringing in the formulary.  Plan Will check an A1C: if too high will send in Rx for pioglitizone

## 2013-06-21 NOTE — Progress Notes (Signed)
Pre visit review using our clinic review tool, if applicable. No additional management support is needed unless otherwise documented below in the visit note. 

## 2013-06-21 NOTE — Progress Notes (Signed)
Subjective:    Patient ID: [name], male    DOB: 21-Aug-1931, 80 y.o.   MRN: [id number]  HPI [name] presents to discuss medications: DDP4 was too costly based on his formulary. In addition he feels his diet is much better.  He has done well since his back surgery. It did not fix all his arthritic problems but has relieved the symptoms associated with spinal stenosis.  Past Medical History  Diagnosis Date  . HERPES SIMPLEX INFECTION 01/19/2007  . TINEA PEDIS 11/15/2009  . DIABETES MELLITUS 01/19/2007  . HYPERLIPIDEMIA 01/19/2007  . DEPRESSION, CHRONIC 06/24/2007  . Carpal tunnel syndrome 03/24/2007  . HYPERTENSION 01/19/2007  . Intermediate coronary syndrome 08/29/2008  . CORONARY ARTERY DISEASE 01/19/2007  . URI 01/11/2009  . OSTEOARTHRITIS 06/24/2007  . SHOULDER PAIN, LEFT 03/24/2007  . SPINAL STENOSIS, LUMBAR 06/03/2009  . BACK PAIN, CHRONIC 04/18/2009  . PLANTAR FASCIITIS, RIGHT 01/19/2007  . LEG CRAMPS, NOCTURNAL 10/28/2007  . HEART MURMUR, SYSTOLIC 2/35/5732  . Diarrhea 02/23/2007  . BENIGN PROSTATIC HYPERTROPHY, HX OF 01/19/2007  . PERCUTANEOUS TRANSLUMINAL CORONARY ANGIOPLASTY, HX OF 01/19/2007  . Other postprocedural status(V45.89) 01/19/2007  . DIABETES MELLITUS, TYPE II, WITH NEUROLOGICAL COMPLICATIONS 2/0/2542  . Leg cramps, sleep related 08/05/2010  . OBSTRUCTIVE SLEEP APNEA 01/19/2007    does not use CPAP   Past Surgical History  Procedure Laterality Date  . Arthroscopy, knee of hx    . Plastic joint in thumb    . Inguinal herniorrhapy, hx    . Percutaneous transluminal coronary angioplasty, hx of  2004    Dr. Lyndel Safe  . Ptca/stent  2004  . Tonsillectomy    . Vasectomy    . Colonoscopy    . Cardiac catheterization    . Nasal sinus surgery     Family History  Problem Relation Age of Onset  . Diabetes Other   . Coronary artery disease Other    History   Social History  . Marital Status: Married    Spouse Name: N/A    Number of Children: N/A  . Years of  Education: N/A   Occupational History  . 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 Smoking status: Former Smoker    Quit date: 04/14/1983  . Smokeless tobacco: Never Used     Comment: stopped 1985  . Alcohol Use: Yes     Comment: 2-3 times a week - wine, scotch  . Drug Use: No  . Sexual Activity: Not on file   Other Topics Concern  . Not on file   Social History Narrative   Married in 1959 - 34yrs, divorced; married 1974 - 1.5 years, divorced; married 1985 1 daughter 71; 73 step daughters; 2 grandchildren.    Current Outpatient Prescriptions on File Prior to Visit  Medication Sig Dispense Refill  . aspirin 81 MG EC tablet Take 81 mg by mouth daily.       . finasteride (PROSCAR) 5 MG tablet Take 1 tablet (5 mg total) by mouth daily.  90 tablet  3  . furosemide (LASIX) 40 MG tablet TAKE 1 TABLET BY MOUTH DAILY  90 tablet  1  . Glucose Blood (FREESTYLE LITE TEST VI)       . meloxicam (MOBIC) 15 MG tablet Take 1 tablet (15 mg total) by mouth daily.  90 tablet  3  . metFORMIN (GLUCOPHAGE) 1000 MG tablet Take 1 tablet (1,000 mg total) by mouth 2 (two) times  daily with a meal.  180 tablet  3  . methocarbamol (ROBAXIN) 500 MG tablet Take 1 tablet (500 mg total) by mouth every 6 (six) hours as needed for muscle spasms.  60 tablet  3  . metoprolol tartrate (LOPRESSOR) 25 MG tablet Take 1 tablet (25 mg total) by mouth 2 (two) times daily.  180 tablet  3  . sitaGLIPtin (JANUVIA) 100 MG tablet Take 100 mg by mouth daily.      . tamsulosin (FLOMAX) 0.4 MG CAPS capsule Take 0.4 mg by mouth daily after supper.      . valsartan-hydrochlorothiazide (DIOVAN HCT) 160-12.5 MG per tablet Take 1 tablet by mouth daily.  90 tablet  3   No current facility-administered medications on file prior to visit.      Review of Systems System review is negative for any constitutional, cardiac, pulmonary, GI or neuro symptoms or complaints other than as described in the  HPI.     Objective:   Physical Exam Filed Vitals:   06/21/13 1255  BP: 156/92  Pulse: 66  Temp: 97 F (36.1 C)   Gen'l- WNWD man in no distress HEENT - [place]/AT, C&S clear Cor - 2+ radial pulse Pulm - normal respirations Neuro - A&O x 3, MS - 5/5 throughout, normal get up and go, Cerebellar - no tremor, normal gait.       Assessment & Plan:

## 2013-06-21 NOTE — Assessment & Plan Note (Addendum)
Reviewed formulary: actos is covered, no DPP4 covered except 3rd or 4th tier.  Plan A1C- recommendation about Actos to follow.   Addendum A1C 6% - will continue on metformin as single agent.

## 2013-08-18 LAB — HM DIABETES EYE EXAM

## 2013-08-22 ENCOUNTER — Telehealth: Payer: Self-pay | Admitting: Internal Medicine

## 2013-08-22 ENCOUNTER — Encounter: Payer: Self-pay | Admitting: Internal Medicine

## 2013-08-22 NOTE — Telephone Encounter (Signed)
Rec'd from [employer] forward 3 pages to Dr.Norins

## 2013-10-30 ENCOUNTER — Other Ambulatory Visit (HOSPITAL_BASED_OUTPATIENT_CLINIC_OR_DEPARTMENT_OTHER): Payer: Self-pay | Admitting: Family Medicine

## 2013-10-30 DIAGNOSIS — R937 Abnormal findings on diagnostic imaging of other parts of musculoskeletal system: Secondary | ICD-10-CM

## 2013-11-02 ENCOUNTER — Ambulatory Visit (HOSPITAL_BASED_OUTPATIENT_CLINIC_OR_DEPARTMENT_OTHER)
Admission: RE | Admit: 2013-11-02 | Discharge: 2013-11-02 | Disposition: A | Discharge status: Home / Self Care | Payer: Medicare Other | Source: Ambulatory Visit | Attending: Family Medicine | Admitting: Family Medicine

## 2013-11-02 DIAGNOSIS — I658 Occlusion and stenosis of other precerebral arteries: Secondary | ICD-10-CM | POA: Insufficient documentation

## 2013-11-02 DIAGNOSIS — I6529 Occlusion and stenosis of unspecified carotid artery: Secondary | ICD-10-CM | POA: Diagnosis not present

## 2013-11-02 DIAGNOSIS — R937 Abnormal findings on diagnostic imaging of other parts of musculoskeletal system: Secondary | ICD-10-CM | POA: Insufficient documentation

## 2013-11-02 DIAGNOSIS — M4802 Spinal stenosis, cervical region: Secondary | ICD-10-CM | POA: Insufficient documentation

## 2013-11-02 MED ORDER — IOHEXOL 300 MG/ML  SOLN
75.0000 mL | Freq: Once | INTRAMUSCULAR | Status: AC | PRN
Start: 2013-11-02 — End: 2013-11-02
  Administered 2013-11-02: 75 mL via INTRAVENOUS

## 2013-11-06 ENCOUNTER — Ambulatory Visit (INDEPENDENT_AMBULATORY_CARE_PROVIDER_SITE_OTHER): Payer: Medicare Other | Admitting: Physical Therapy

## 2013-11-06 DIAGNOSIS — M159 Polyosteoarthritis, unspecified: Secondary | ICD-10-CM

## 2013-11-06 DIAGNOSIS — R5381 Other malaise: Secondary | ICD-10-CM

## 2013-11-06 DIAGNOSIS — M2550 Pain in unspecified joint: Secondary | ICD-10-CM

## 2013-11-13 ENCOUNTER — Encounter: Payer: Medicare Other | Admitting: Physical Therapy

## 2013-12-22 ENCOUNTER — Other Ambulatory Visit (HOSPITAL_COMMUNITY): Payer: Self-pay | Admitting: Family Medicine

## 2013-12-22 DIAGNOSIS — R0989 Other specified symptoms and signs involving the circulatory and respiratory systems: Secondary | ICD-10-CM

## 2013-12-22 DIAGNOSIS — R0609 Other forms of dyspnea: Secondary | ICD-10-CM

## 2013-12-27 ENCOUNTER — Ambulatory Visit (HOSPITAL_COMMUNITY)
Admission: RE | Admit: 2013-12-27 | Discharge: 2013-12-27 | Disposition: A | Discharge status: Home / Self Care | Payer: Medicare Other | Source: Ambulatory Visit | Attending: Family Medicine | Admitting: Family Medicine

## 2013-12-27 DIAGNOSIS — I509 Heart failure, unspecified: Secondary | ICD-10-CM | POA: Insufficient documentation

## 2013-12-27 DIAGNOSIS — R609 Edema, unspecified: Secondary | ICD-10-CM | POA: Insufficient documentation

## 2013-12-27 DIAGNOSIS — R0989 Other specified symptoms and signs involving the circulatory and respiratory systems: Secondary | ICD-10-CM | POA: Insufficient documentation

## 2013-12-27 DIAGNOSIS — R0609 Other forms of dyspnea: Secondary | ICD-10-CM

## 2013-12-27 NOTE — Progress Notes (Signed)
  Echocardiogram 2D Echocardiogram has been performed.  [name] 12/27/2013, 2:27 PM

## 2014-01-12 ENCOUNTER — Encounter: Payer: Self-pay | Admitting: Internal Medicine

## 2014-10-08 ENCOUNTER — Other Ambulatory Visit: Payer: Self-pay

## 2014-12-19 ENCOUNTER — Other Ambulatory Visit: Payer: Self-pay | Admitting: Family Medicine

## 2014-12-19 DIAGNOSIS — R9389 Abnormal findings on diagnostic imaging of other specified body structures: Secondary | ICD-10-CM

## 2014-12-19 DIAGNOSIS — Z87891 Personal history of nicotine dependence: Secondary | ICD-10-CM

## 2014-12-19 DIAGNOSIS — R05 Cough: Secondary | ICD-10-CM

## 2014-12-19 DIAGNOSIS — R053 Chronic cough: Secondary | ICD-10-CM

## 2014-12-24 ENCOUNTER — Ambulatory Visit (INDEPENDENT_AMBULATORY_CARE_PROVIDER_SITE_OTHER): Payer: PPO

## 2014-12-24 DIAGNOSIS — I517 Cardiomegaly: Secondary | ICD-10-CM

## 2014-12-24 DIAGNOSIS — R05 Cough: Secondary | ICD-10-CM

## 2014-12-24 DIAGNOSIS — R9389 Abnormal findings on diagnostic imaging of other specified body structures: Secondary | ICD-10-CM

## 2014-12-24 DIAGNOSIS — Z87891 Personal history of nicotine dependence: Secondary | ICD-10-CM

## 2014-12-24 DIAGNOSIS — R053 Chronic cough: Secondary | ICD-10-CM

## 2015-01-12 IMAGING — CR DG ABDOMEN ACUTE W/ 1V CHEST
4 series · 4 of 4 positions shown · non-contrast
Comparison: Chest radiograph 02/06/2011.

CLINICAL DATA: Epigastric pain.  Nausea.  Diarrhea.  Fever.

ACUTE ABDOMEN SERIES (ABDOMEN 2 VIEW & CHEST 1 VIEW)

[w chest pa]
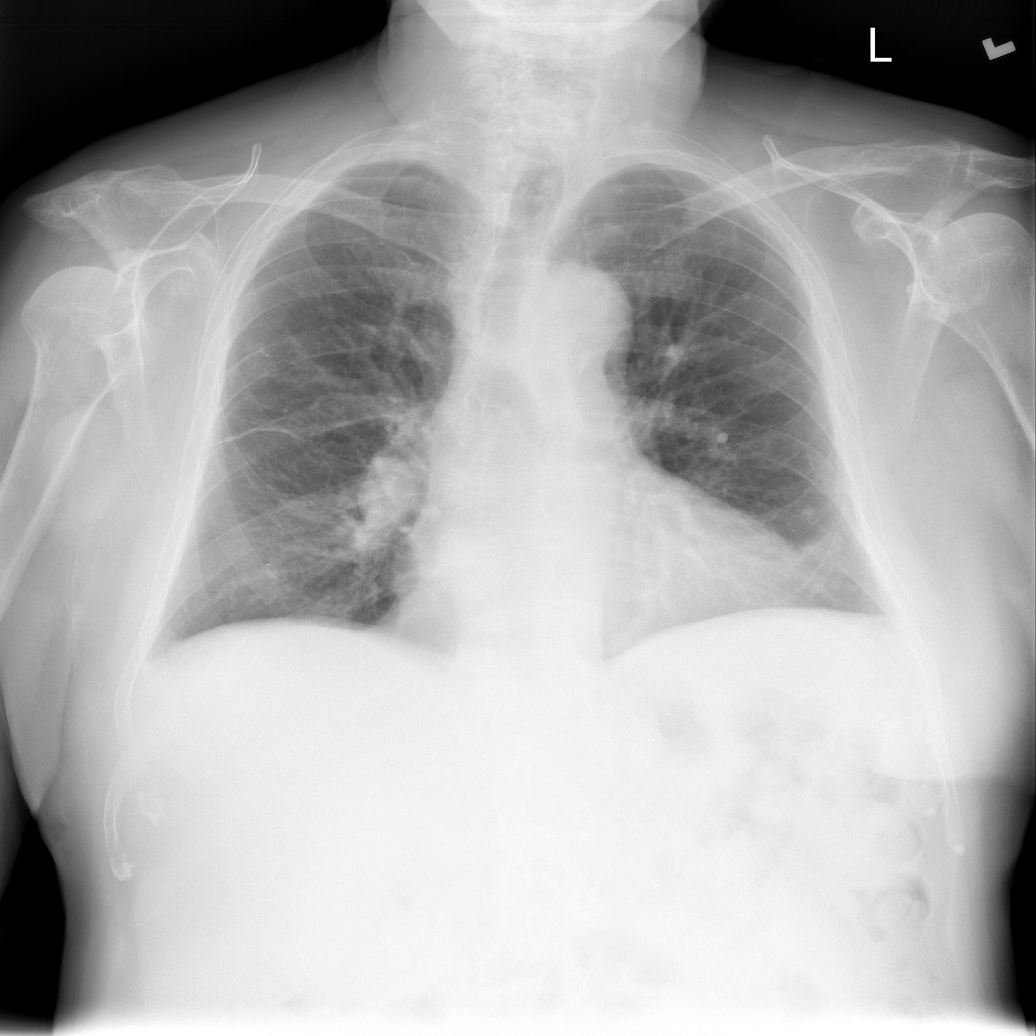

[w abdomen upright]
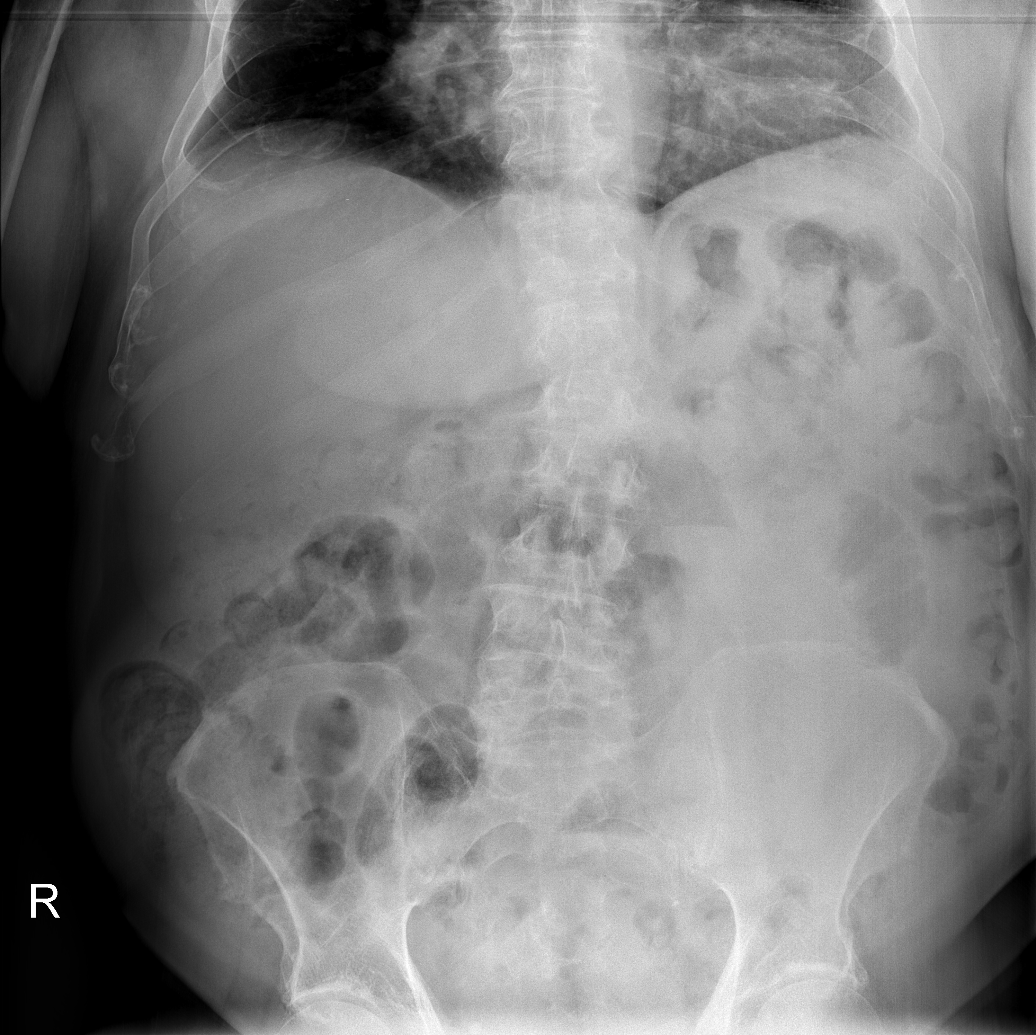

[t abdomen supine (1 of 2)]
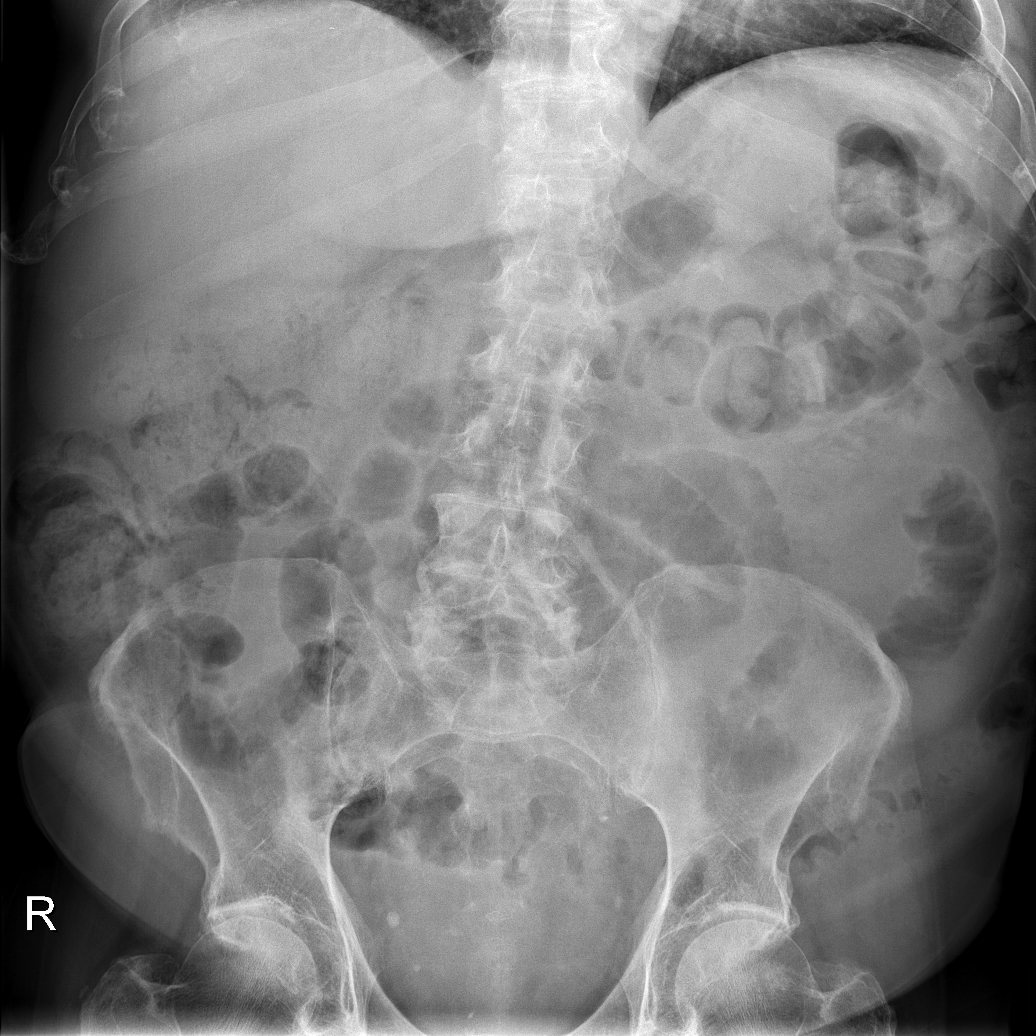

[t abdomen supine (2 of 2)]
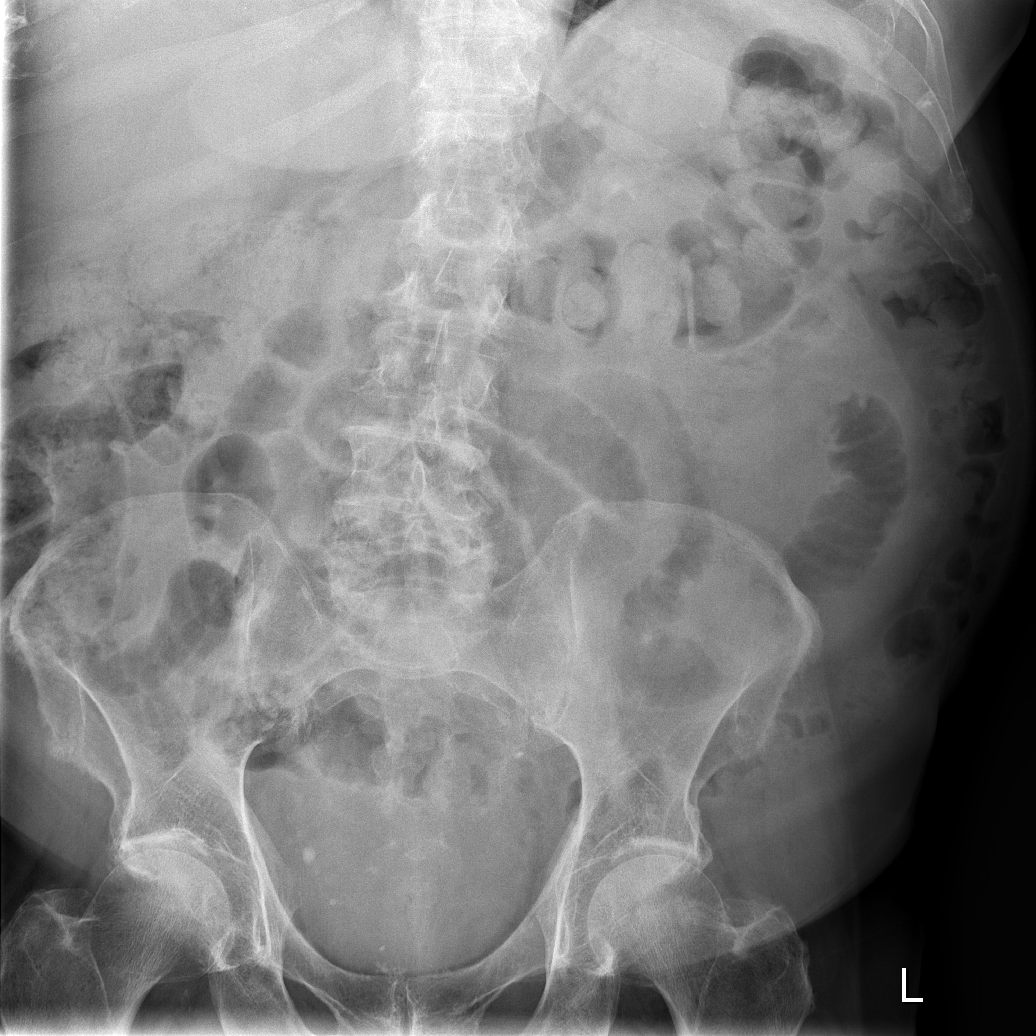

[4 of 4 positions shown; findings below may reference images not displayed]

FINDINGS: Faint nodular density is present along the left heart
border.  This appears representing granuloma and was likely present
on the prior exam 08/30/2008.  Subsegmental atelectasis at the lung
bases and in the right midlung.  No airspace disease.
Cardiopericardial silhouette is mildly enlarged.  There is no free
air underneath the hemidiaphragms.  Large stool burden.  Mildly
dilated loops of small bowel is present in the left abdomen.  Stool
and bowel gas extends to the descending colon and junction of the
sigmoid.  Paucity of rectal gas present.
IMPRESSION: 1.  Mildly dilated single loop of small bowel in the left mid
abdomen.  No pathologic air fluid level.  The cause for this mildly
dilated loop of bowel is unclear.  Early small bowel obstruction is
difficult to exclude although not favored.  This pattern can be
associated with enteric infection. Consider CT for further
evaluation.
2.  Large right-sided stool burden.

## 2015-04-16 DIAGNOSIS — I10 Essential (primary) hypertension: Secondary | ICD-10-CM | POA: Diagnosis not present

## 2015-04-16 DIAGNOSIS — M159 Polyosteoarthritis, unspecified: Secondary | ICD-10-CM | POA: Diagnosis not present

## 2015-04-16 DIAGNOSIS — E1140 Type 2 diabetes mellitus with diabetic neuropathy, unspecified: Secondary | ICD-10-CM | POA: Diagnosis not present

## 2015-04-16 DIAGNOSIS — R252 Cramp and spasm: Secondary | ICD-10-CM | POA: Diagnosis not present

## 2015-04-16 DIAGNOSIS — L03113 Cellulitis of right upper limb: Secondary | ICD-10-CM | POA: Diagnosis not present

## 2015-04-16 DIAGNOSIS — Z7984 Long term (current) use of oral hypoglycemic drugs: Secondary | ICD-10-CM | POA: Diagnosis not present

## 2015-04-16 DIAGNOSIS — E785 Hyperlipidemia, unspecified: Secondary | ICD-10-CM | POA: Diagnosis not present

## 2015-04-16 DIAGNOSIS — G4733 Obstructive sleep apnea (adult) (pediatric): Secondary | ICD-10-CM | POA: Diagnosis not present

## 2015-04-24 ENCOUNTER — Ambulatory Visit (INDEPENDENT_AMBULATORY_CARE_PROVIDER_SITE_OTHER): Payer: PPO | Admitting: Rehabilitative and Restorative Service Providers"

## 2015-04-24 ENCOUNTER — Encounter: Payer: Self-pay | Admitting: Rehabilitative and Restorative Service Providers"

## 2015-04-24 DIAGNOSIS — R29898 Other symptoms and signs involving the musculoskeletal system: Secondary | ICD-10-CM | POA: Diagnosis not present

## 2015-04-24 DIAGNOSIS — M623 Immobility syndrome (paraplegic): Secondary | ICD-10-CM

## 2015-04-24 DIAGNOSIS — M25562 Pain in left knee: Secondary | ICD-10-CM

## 2015-04-24 DIAGNOSIS — M25561 Pain in right knee: Secondary | ICD-10-CM | POA: Diagnosis not present

## 2015-04-24 DIAGNOSIS — R6889 Other general symptoms and signs: Secondary | ICD-10-CM

## 2015-04-24 DIAGNOSIS — R531 Weakness: Secondary | ICD-10-CM

## 2015-04-24 DIAGNOSIS — Z7409 Other reduced mobility: Secondary | ICD-10-CM | POA: Diagnosis not present

## 2015-04-24 DIAGNOSIS — M2560 Stiffness of unspecified joint, not elsewhere classified: Secondary | ICD-10-CM

## 2015-04-24 NOTE — Therapy (Addendum)
[facility] [place], [place], [address] Phone: [phone]   Fax:  [phone]  Physical Therapy Evaluation  Patient Details  Name: [name] MRN: [id number] Date of Birth: November 09, 1931 Referring Provider: Dr. [name]  Encounter Date: 04/24/2015      PT End of Session - 04/24/15 1411    Visit Number 1   Number of Visits 12   Date for PT Re-Evaluation 06/05/15   PT Start Time 1416   PT Stop Time 1514   PT Time Calculation (min) 58 min   Activity Tolerance Patient tolerated treatment well      Past Medical History  Diagnosis Date  . HERPES SIMPLEX INFECTION 01/19/2007  . TINEA PEDIS 11/15/2009  . DIABETES MELLITUS 01/19/2007  . HYPERLIPIDEMIA 01/19/2007  . DEPRESSION, CHRONIC 06/24/2007  . Carpal tunnel syndrome 03/24/2007  . HYPERTENSION 01/19/2007  . Intermediate coronary syndrome ([place]) 08/29/2008  . CORONARY ARTERY DISEASE 01/19/2007  . URI 01/11/2009  . OSTEOARTHRITIS 06/24/2007  . SHOULDER PAIN, LEFT 03/24/2007  . SPINAL STENOSIS, LUMBAR 06/03/2009  . BACK PAIN, CHRONIC 04/18/2009  . PLANTAR FASCIITIS, RIGHT 01/19/2007  . LEG CRAMPS, NOCTURNAL 10/28/2007  . HEART MURMUR, SYSTOLIC [id number]  . Diarrhea 02/23/2007  . BENIGN PROSTATIC HYPERTROPHY, HX OF 01/19/2007  . PERCUTANEOUS TRANSLUMINAL CORONARY ANGIOPLASTY, HX OF 01/19/2007  . Other postprocedural status(V45.89) 01/19/2007  . DIABETES MELLITUS, TYPE II, WITH NEUROLOGICAL COMPLICATIONS [id number]  . Leg cramps, sleep related 08/05/2010  . OBSTRUCTIVE SLEEP APNEA 01/19/2007    does not use CPAP    Past Surgical History  Procedure Laterality Date  . Arthroscopy, knee of hx    . Plastic joint in thumb    . Inguinal herniorrhapy, hx    . Percutaneous transluminal coronary angioplasty, hx of  2004    Dr. Lyndel Safe  . Ptca/stent  2004  . Tonsillectomy    . Vasectomy    . Colonoscopy    . Cardiac catheterization    . Nasal sinus surgery      There  were no vitals filed for this visit.  Visit Diagnosis:  Arthralgia of both knees - Plan: PT plan of care cert/re-cert  Stiffness due to immobility - Plan: PT plan of care cert/re-cert  Weakness of both lower limbs - Plan: PT plan of care cert/re-cert  Decreased strength, endurance, and mobility - Plan: PT plan of care cert/re-cert      Subjective Assessment - 04/24/15 1420    Subjective [name] reports that he has had bilat knee problems for 15 or more years with increase in symptoms in the past 6-12 months. Pt reports that is is difficult to walk, sit and stand out of a chair; ascending or descending stairs; stepping onto a step stool; getting in and out of a car due to pain.    Pertinent History Congestive heart failure; OA multiple joints; Type II diabetes; bladder changes   How long can you sit comfortably? no limit   How long can you stand comfortably? 20 min    How long can you walk comfortably? 1 min    Diagnostic tests xrays - arthritic changes - bone on bone    Patient Stated Goals get stronger to get in and out of car; walk and go up and down stairs with greater ease - avoid    Currently in Pain? Yes   Pain Score 5    Pain Location Knee   Pain Orientation Left   Pain Descriptors / Indicators  Aching   Pain Radiating Towards up into thigh and at times into hip; down into the calf    Pain Onset More than a month ago   Pain Frequency Intermittent   Aggravating Factors  standing; walking; stairs; stit to stand and stand to sit; stepping onto stool    Pain Relieving Factors injections    Multiple Pain Sites Yes   Pain Score 7  feels Rt knee is weaker so it hurts more when he walks    Pain Location Knee   Pain Orientation Right   Pain Descriptors / Indicators Aching   Pain Type Chronic pain   Pain Onset More than a month ago   Pain Frequency Intermittent   Aggravating Factors  as above             [facility] PT Assessment - 04/24/15 0001    Assessment   Medical Diagnosis OA  bilat knees    Referring Provider Dr. [name]   Onset Date/Surgical Date 04/13/14   Hand Dominance Left   Next MD Visit 05/14/15   Prior Therapy yes 2-3 yrs ago for knee pain    Precautions   Precautions None   Balance Screen   Has the patient fallen in the past 6 months No   Has the patient had a decrease in activity level because of a fear of falling?  No   Is the patient reluctant to leave their home because of a fear of falling?  No   Home Environment   Additional Comments 3 steps with railing to enter single level home    Prior Function   Level of Independence Independent   Vocation Retired   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 - climbed up and down [employer]    Leisure reading; riding mower for yard work; takes out Producer, television/film/video   Observation/Other Assessments   Focus on Therapeutic Outcomes (FOTO)  70% limitation    Sensation   Additional Comments WFL's per pt report - has cramping in both legs at night mostly in the calf nightly    Posture/Postural Control   Posture Comments head forward; shoulders rounded; flexed forward at hips; bilat knees genu varus position in standing/walking    AROM   Overall AROM Comments end range tightness at bilat hips    Right/Left Hip --  tight hip ext bilat neutral to 5 deg    Right/Left Knee --  WFL's bilat    Right/Left Ankle --  WFL's    Strength   Right Hip Flexion 4/5   Right Hip Extension 4+/5   Right Hip ABduction 4/5   Right Hip ADduction 4/5   Left Hip Flexion 4+/5   Left Hip Extension 4+/5   Left Hip ABduction 4/5   Left Hip ADduction 4+/5   Right Knee Flexion 4/5   Right Knee Extension 4+/5   Left Knee Flexion 4/5   Left Knee Extension 4+/5   Right/Left Ankle --  grossly 4+/5 to 5-/5 bilat    Flexibility   Hamstrings Rt 72 deg; Lt 68 deg    Quadriceps Rt knee flex 94 deg; Lt 89 deg    ITB tight Lt > Rt    Piriformis tight Lt > Rt    Palpation   Palpation  comment tightness noted through musculature bilat thighs/calves/hip ext - abd    Ambulation/Gait   Gait Comments antalgic gait with shortened stride length bilat; limp Lt LE in  wt bearing    Balance   Balance Assessed --  decreased SLS < 5 sec bilat                    [facility] Adult PT Treatment/Exercise - 04/24/15 0001    Knee/Hip Exercises: Stretches   Passive Hamstring Stretch 3 reps;30 seconds   Quad Stretch 3 reps;30 seconds   ITB Stretch 3 reps;30 seconds   Gastroc Stretch 3 reps;30 seconds   Soleus Stretch 3 reps;30 seconds   Knee/Hip Exercises: Prone   Other Prone Exercises gluteal set 10 sec x 10                 PT Education - 04/24/15 1505    Education provided Yes   Education Details importance of consistent stretching to help with cramps and how legs feel; HEP   Person(s) Educated Patient   Methods Explanation;Demonstration;Tactile cues;Verbal cues;Handout   Comprehension Verbalized understanding;Returned demonstration;Verbal cues required;Tactile cues required             PT Long Term Goals - 04/24/15 1411    PT LONG TERM GOAL #1   Title Increase strength bilat LE's to 5-/5 to 5/5 06/05/15   Time 6   Period Weeks   Status New   PT LONG TERM GOAL #2   Title Increase functional activity level allowing patient to get in and out of chairs; ascend and descend stairs with less difficulty and decreased pain 06/05/15   Time 6   Period Weeks   Status New   PT LONG TERM GOAL #3   Title Patient I In HEP 06/05/15   Time 6   Period Weeks   Status New   PT LONG TERM GOAL #4   Title Improve FOTO to </= 52% limitation 06/05/15   Time 6   Period Weeks   Status New   PT LONG TERM GOAL #5   Title Patient tol ambulation for 10 min with min pain 06/05/15   Time 6   Period Weeks   Status New               Plan - 04/24/15 1518    Clinical Impression Statement Patient presents with bilat knee/LE pain with limited mobility; decresaed strength;  abnormal gait pattern; decreased functional activity level. He will benefit from PT to address problems as identified.    Pt will benefit from skilled therapeutic intervention in order to improve on the following deficits Abnormal gait;Decreased strength;Decreased mobility;Decreased activity tolerance;Decreased endurance;Pain   Rehab Potential Good   PT Frequency 2x / week   PT Duration 6 weeks   PT Treatment/Interventions Patient/family education;ADLs/Self Care Home Management;Therapeutic exercise;Therapeutic activities;Neuromuscular re-education;Balance training;Cryotherapy;Electrical Stimulation;Iontophoresis 4mg /ml Dexamethasone;Moist Heat;Ultrasound;Dry needling   PT Next Visit Plan Stretching; strengthening bilat LE's; balance activites    PT Home Exercise Plan HEP    Consulted and Agree with Plan of Care Patient         Problem List Patient Active Problem List   Diagnosis Date Noted  . Spondylolisthesis of lumbar region 05/02/2013  . Cervical spine disease 04/16/2013  . Unspecified constipation 10/13/2012  . Advanced care planning/counseling discussion 03/02/2012  . BPH with urinary obstruction 03/22/2011  . DIABETES MELLITUS, TYPE II, WITH NEUROLOGICAL COMPLICATIONS 0000000  . TINEA PEDIS 11/15/2009  . SPINAL STENOSIS, LUMBAR 06/03/2009  . BACK PAIN, CHRONIC 04/18/2009  . INTERMEDIATE CORONARY SYNDROME 08/29/2008  . HEART MURMUR, SYSTOLIC [id number]  . Cramp of limb 10/28/2007  . DEPRESSION, CHRONIC 06/24/2007  .  OSTEOARTHRITIS 06/24/2007  . CARPAL TUNNEL SYNDROME 03/24/2007  . HERPES SIMPLEX INFECTION 01/19/2007  . DIABETES MELLITUS 01/19/2007  . HYPERLIPIDEMIA 01/19/2007  . OBSTRUCTIVE SLEEP APNEA 01/19/2007  . HYPERTENSION 01/19/2007  . CORONARY ARTERY DISEASE 01/19/2007  . BENIGN PROSTATIC HYPERTROPHY, HX OF 01/19/2007  . PERCUTANEOUS TRANSLUMINAL CORONARY ANGIOPLASTY, HX OF 01/19/2007    Trenisha Lafavor Nilda Simmer PT, MPH  04/24/2015, 5:11 PM  [facility] [place], [place], [address] Phone: [phone]   Fax:  [phone]  Name: [name] MRN: [id number] Date of Birth: May 22, 1931

## 2015-04-24 NOTE — Patient Instructions (Signed)
Flexion: Stretch - Quadriceps (Prone)   Can do this stretch with strap  Position Helper: Place one hand on shin near left ankle. Stabilize buttock to keep hip flat on bed. Motion - Helper presses heel toward buttock slowly. CAUTION: Patient should feel stretch along front of thigh. There should be no pain in knee joint. Hold 30__ seconds. Repeat _3__ times. Repeat with other leg. Do _2-3__ sessions per day   Gluteal Sets   Lying on stomach  Squeeze pelvic floor and hold. Tighten bottom. Hold for _10__ seconds. Relax for _2__ seconds. Repeat _10__ times. Do _2-3__ times a day.   HIP: Hamstrings - Supine    Place strap around foot. Raise leg up, keep knee straight. Hold 30_ seconds. _3_ reps per set, __2-3_ sets per day   Outer Hip Stretch: Reclined IT Band Stretch (Strap)    Strap around opposite foot, pull across only as far as possible with shoulders on mat. Hold for __30 secs. Repeat __3__ times each leg.   Gastroc Stretch    Stand with right foot back, leg straight, forward leg bent. Keeping heel on floor, turned slightly out, lean into wall until stretch is felt in calf. Hold __30__ seconds. Repeat __3__ times per set.  Do __2-3__ sessions per day.   Calf Stretch (Soleus)    Gently bend knees slightly and move one foot back. Lean into wall so that stretch is felt in back lower calf. Hold __30__ seconds. Repeat with other leg back. Repeat __3__ times. Do _2-3___ sessions per day.

## 2015-04-26 ENCOUNTER — Ambulatory Visit (INDEPENDENT_AMBULATORY_CARE_PROVIDER_SITE_OTHER): Payer: PPO | Admitting: Physical Therapy

## 2015-04-26 DIAGNOSIS — R29898 Other symptoms and signs involving the musculoskeletal system: Secondary | ICD-10-CM | POA: Diagnosis not present

## 2015-04-26 DIAGNOSIS — Z7409 Other reduced mobility: Secondary | ICD-10-CM | POA: Diagnosis not present

## 2015-04-26 DIAGNOSIS — M623 Immobility syndrome (paraplegic): Secondary | ICD-10-CM | POA: Diagnosis not present

## 2015-04-26 DIAGNOSIS — M2560 Stiffness of unspecified joint, not elsewhere classified: Secondary | ICD-10-CM

## 2015-04-26 DIAGNOSIS — R6889 Other general symptoms and signs: Secondary | ICD-10-CM

## 2015-04-26 DIAGNOSIS — M25561 Pain in right knee: Secondary | ICD-10-CM | POA: Diagnosis not present

## 2015-04-26 DIAGNOSIS — M25562 Pain in left knee: Secondary | ICD-10-CM

## 2015-04-26 DIAGNOSIS — R531 Weakness: Secondary | ICD-10-CM

## 2015-04-26 NOTE — Therapy (Signed)
[facility], [place], [address] Phone: [phone]   Fax:  [phone]  Physical Therapy Treatment  Patient Details  Name: [name] MRN: [id number] Date of Birth: 1931-04-23 Referring Provider: Dr. [name]  Encounter Date: 04/26/2015      PT End of Session - 04/26/15 1335    Visit Number 2   Number of Visits 12   Date for PT Re-Evaluation 06/05/15   PT Start Time 1332   PT Stop Time 1405   PT Time Calculation (min) 33 min   Activity Tolerance Patient tolerated treatment well;No increased pain      Past Medical History  Diagnosis Date  . HERPES SIMPLEX INFECTION 01/19/2007  . TINEA PEDIS 11/15/2009  . DIABETES MELLITUS 01/19/2007  . HYPERLIPIDEMIA 01/19/2007  . DEPRESSION, CHRONIC 06/24/2007  . Carpal tunnel syndrome 03/24/2007  . HYPERTENSION 01/19/2007  . Intermediate coronary syndrome ([place]) 08/29/2008  . CORONARY ARTERY DISEASE 01/19/2007  . URI 01/11/2009  . OSTEOARTHRITIS 06/24/2007  . SHOULDER PAIN, LEFT 03/24/2007  . SPINAL STENOSIS, LUMBAR 06/03/2009  . BACK PAIN, CHRONIC 04/18/2009  . PLANTAR FASCIITIS, RIGHT 01/19/2007  . LEG CRAMPS, NOCTURNAL 10/28/2007  . HEART MURMUR, SYSTOLIC [id number]  . Diarrhea 02/23/2007  . BENIGN PROSTATIC HYPERTROPHY, HX OF 01/19/2007  . PERCUTANEOUS TRANSLUMINAL CORONARY ANGIOPLASTY, HX OF 01/19/2007  . Other postprocedural status(V45.89) 01/19/2007  . DIABETES MELLITUS, TYPE II, WITH NEUROLOGICAL COMPLICATIONS [id number]  . Leg cramps, sleep related 08/05/2010  . OBSTRUCTIVE SLEEP APNEA 01/19/2007    does not use CPAP    Past Surgical History  Procedure Laterality Date  . Arthroscopy, knee of hx    . Plastic joint in thumb    . Inguinal herniorrhapy, hx    . Percutaneous transluminal coronary angioplasty, hx of  2004    Dr. Lyndel Safe  . Ptca/stent  2004  . Tonsillectomy    . Vasectomy    . Colonoscopy    . Cardiac catheterization    . Nasal sinus  surgery      There were no vitals filed for this visit.  Visit Diagnosis:  Arthralgia of both knees  Stiffness due to immobility  Weakness of both lower limbs  Decreased strength, endurance, and mobility      Subjective Assessment - 04/26/15 1335    Subjective Pt reports he has been doing the stretches.  Did them on floor, because bed felt too soft - then had trouble getting floor.  Pt reports he has had less leg cramps.    Currently in Pain? Yes   Pain Score 5    Pain Location Knee   Pain Orientation Left;Right   Pain Descriptors / Indicators Aching            [facility] PT Assessment - 04/26/15 0001    Assessment   Medical Diagnosis OA bilat knees           [facility] Adult PT Treatment/Exercise - 04/26/15 0001    Knee/Hip Exercises: Stretches   Passive Hamstring Stretch Right;Left;3 reps;30 seconds   Passive Hamstring Stretch Limitations seated with back straight, leaning forward    Quad Stretch 3 reps;30 seconds  prone with strap   Gastroc Stretch Both;3 reps;30 seconds   Gastroc Stretch Limitations heels off step    Soleus Stretch Right;Left;3 reps;30 seconds   Soleus Stretch Limitations heel off step   Knee/Hip Exercises: Aerobic   Nustep L3: 4 min    Knee/Hip Exercises: Standing   Hip Abduction Stengthening;Right;Left;1  set;10 reps;Knee straight   Functional Squat 1 set;10 reps  with UE support   Knee/Hip Exercises: Seated   Long Arc Quad Strengthening;Right;Left;1 set;10 reps  5 sec hold in extension   Long Arc Quad Weight 2 lbs.   Other Seated Knee/Hip Exercises seated marching x  20    Abduction/Adduction  Both;2 sets;10 reps  green then blue band around thighs (clam)   Modalities   Modalities --  declined           PT Long Term Goals - 04/26/15 1416    PT LONG TERM GOAL #1   Title Increase strength bilat LE's to 5-/5 to 5/5 06/05/15   Time 6   Period Weeks   Status On-going   PT LONG TERM GOAL #2   Title Increase functional activity level  allowing patient to get in and out of chairs; ascend and descend stairs with less difficulty and decreased pain 06/05/15   Time 6   Period Weeks   Status On-going   PT LONG TERM GOAL #3   Title Patient I In HEP 06/05/15   Time 6   Period Weeks   Status On-going   PT LONG TERM GOAL #4   Title Improve FOTO to </= 52% limitation 06/05/15   Time 6   Period Weeks   Status On-going   PT LONG TERM GOAL #5   Title Patient tol ambulation for 10 min with min pain 06/05/15   Time 6   Period Weeks   Status On-going               Plan - 04/26/15 1411    Clinical Impression Statement Pt tolerated new exercises well without increase in knee pain.  Pt did require minimal cues for good form with exercises. Pt declined use of modalities; pt reported decreased of knee stiffness/pain at end of session.  Progressing towards goals.    Pt will benefit from skilled therapeutic intervention in order to improve on the following deficits Abnormal gait;Decreased strength;Decreased mobility;Decreased activity tolerance;Decreased endurance;Pain   Rehab Potential Good   PT Frequency 2x / week   PT Duration 6 weeks   PT Treatment/Interventions Patient/family education;ADLs/Self Care Home Management;Therapeutic exercise;Therapeutic activities;Neuromuscular re-education;Balance training;Cryotherapy;Electrical Stimulation;Iontophoresis 4mg /ml Dexamethasone;Moist Heat;Ultrasound;Dry needling   PT Next Visit Plan Continue progressive Stretching; strengthening bilat LE's; balance activites    Consulted and Agree with Plan of Care Patient        Problem List Patient Active Problem List   Diagnosis Date Noted  . Spondylolisthesis of lumbar region 05/02/2013  . Cervical spine disease 04/16/2013  . Unspecified constipation 10/13/2012  . Advanced care planning/counseling discussion 03/02/2012  . BPH with urinary obstruction 03/22/2011  . DIABETES MELLITUS, TYPE II, WITH NEUROLOGICAL COMPLICATIONS 0000000  .  TINEA PEDIS 11/15/2009  . SPINAL STENOSIS, LUMBAR 06/03/2009  . BACK PAIN, CHRONIC 04/18/2009  . INTERMEDIATE CORONARY SYNDROME 08/29/2008  . HEART MURMUR, SYSTOLIC [id number]  . Cramp of limb 10/28/2007  . DEPRESSION, CHRONIC 06/24/2007  . OSTEOARTHRITIS 06/24/2007  . CARPAL TUNNEL SYNDROME 03/24/2007  . HERPES SIMPLEX INFECTION 01/19/2007  . DIABETES MELLITUS 01/19/2007  . HYPERLIPIDEMIA 01/19/2007  . OBSTRUCTIVE SLEEP APNEA 01/19/2007  . HYPERTENSION 01/19/2007  . CORONARY ARTERY DISEASE 01/19/2007  . BENIGN PROSTATIC HYPERTROPHY, HX OF 01/19/2007  . PERCUTANEOUS TRANSLUMINAL CORONARY ANGIOPLASTY, HX OF 01/19/2007    Kerin Perna, PTA 04/26/2015 2:24 PM  [place], [place], [address] Phone: [phone]   Fax:  [phone]  Name: [name] MRN: [id number] Date of Birth: [birth year]

## 2015-04-26 NOTE — Patient Instructions (Signed)
KNEE: Extension, Long Arc Quad (Weight)    Place weight around leg. Raise leg until knee is straight. Hold _5__ seconds. Use __0-2_ lb weight. _10__ reps per set, __1-2_ sets per day, _3__ days per week  ABDUCTION: Standing (Active)    Stand, feet flat. Lift right leg out to side. Use __0-2_ lbs. Complete ___ sets of ___ repetitions. Perform ___ sessions per day.  FUNCTIONAL MOBILITY: Squat With UE Support    Stand by chair or table. Stance: shoulder-width on floor. Bend hips and knees. Keep back straight. Do not allow knees to bend past toes. Squeeze glutes and quads to stand. _10__ reps per set, __2_ sets per session, _3__ days per week   [place] [facility] [place], [place] [address]  [phone] (office) [phone] (fax)

## 2015-05-01 ENCOUNTER — Ambulatory Visit (INDEPENDENT_AMBULATORY_CARE_PROVIDER_SITE_OTHER): Payer: PPO | Admitting: Physical Therapy

## 2015-05-01 ENCOUNTER — Encounter: Payer: Self-pay | Admitting: Physical Therapy

## 2015-05-01 DIAGNOSIS — Z7409 Other reduced mobility: Secondary | ICD-10-CM

## 2015-05-01 DIAGNOSIS — R6889 Other general symptoms and signs: Secondary | ICD-10-CM

## 2015-05-01 DIAGNOSIS — R29898 Other symptoms and signs involving the musculoskeletal system: Secondary | ICD-10-CM | POA: Diagnosis not present

## 2015-05-01 DIAGNOSIS — M623 Immobility syndrome (paraplegic): Secondary | ICD-10-CM | POA: Diagnosis not present

## 2015-05-01 DIAGNOSIS — M25561 Pain in right knee: Secondary | ICD-10-CM | POA: Diagnosis not present

## 2015-05-01 DIAGNOSIS — M2560 Stiffness of unspecified joint, not elsewhere classified: Secondary | ICD-10-CM

## 2015-05-01 DIAGNOSIS — M25562 Pain in left knee: Secondary | ICD-10-CM

## 2015-05-01 DIAGNOSIS — R531 Weakness: Secondary | ICD-10-CM

## 2015-05-01 NOTE — Therapy (Signed)
[place], [place], [address] Phone: [phone]   Fax:  [phone]  Physical Therapy Treatment  Patient Details  Name: [name] MRN: [id number] Date of Birth: [birth year] Referring Provider: Dr. Better [name]  Encounter Date: 05/01/2015      PT End of Session - 05/01/15 1404    Visit Number 3   Number of Visits 12   Date for PT Re-Evaluation 06/05/15   PT Start Time [id number]   PT Stop Time 1448   PT Time Calculation (min) 45 min      Past Medical History  Diagnosis Date  . HERPES SIMPLEX INFECTION 01/19/2007  . TINEA PEDIS 11/15/2009  . DIABETES MELLITUS 01/19/2007  . HYPERLIPIDEMIA 01/19/2007  . DEPRESSION, CHRONIC 06/24/2007  . Carpal tunnel syndrome 03/24/2007  . HYPERTENSION 01/19/2007  . Intermediate coronary syndrome ([place]) 08/29/2008  . CORONARY ARTERY DISEASE 01/19/2007  . URI 01/11/2009  . OSTEOARTHRITIS 06/24/2007  . SHOULDER PAIN, LEFT 03/24/2007  . SPINAL STENOSIS, LUMBAR 06/03/2009  . BACK PAIN, CHRONIC 04/18/2009  . PLANTAR FASCIITIS, RIGHT 01/19/2007  . LEG CRAMPS, NOCTURNAL 10/28/2007  . HEART MURMUR, SYSTOLIC [id number]  . Diarrhea 02/23/2007  . BENIGN PROSTATIC HYPERTROPHY, HX OF 01/19/2007  . PERCUTANEOUS TRANSLUMINAL CORONARY ANGIOPLASTY, HX OF 01/19/2007  . Other postprocedural status(V45.89) 01/19/2007  . DIABETES MELLITUS, TYPE II, WITH NEUROLOGICAL COMPLICATIONS [id number]  . Leg cramps, sleep related 08/05/2010  . OBSTRUCTIVE SLEEP APNEA 01/19/2007    does not use CPAP    Past Surgical History  Procedure Laterality Date  . Arthroscopy, knee of hx    . Plastic joint in thumb    . Inguinal herniorrhapy, hx    . Percutaneous transluminal coronary angioplasty, hx of  2004    Dr. Lyndel Safe  . Ptca/stent  2004  . Tonsillectomy    . Vasectomy    . Colonoscopy    . Cardiac catheterization    . Nasal sinus surgery      There were no vitals filed for this visit.  Visit  Diagnosis:  Arthralgia of both knees  Stiffness due to immobility  Weakness of both lower limbs  Decreased strength, endurance, and mobility      Subjective Assessment - 05/01/15 1406    Subjective "My knees are killing me, but the rest of me is great".  Rt knee feels weaker than Lt - especially with stairs.    Currently in Pain? Yes   Pain Score 7    Pain Location Knee   Pain Orientation Left;Right   Pain Descriptors / Indicators Aching   Aggravating Factors  stairs, sit to stand    Pain Relieving Factors ??            [facility] PT Assessment - 05/01/15 0001    Assessment   Medical Diagnosis OA bilat knees    Referring Provider Dr. Better [name]   Onset Date/Surgical Date 04/13/14   Hand Dominance Left   Next MD Visit 05/14/15   Prior Therapy yes 2-3 yrs ago for knee pain           [facility] Adult PT Treatment/Exercise - 05/01/15 0001    Knee/Hip Exercises: Stretches   Passive Hamstring Stretch Right;Left;2 reps;30 seconds   Passive Hamstring Stretch Limitations seated with back straight, leaning forward    Quad Stretch Right;Left;3 reps;30 seconds   Quad Stretch Limitations first seated with foot under chair; then 2 reps in sidelying    Knee/Hip Exercises: Aerobic   Nustep  L3: 5 min    Knee/Hip Exercises: Standing   Heel Raises Both;1 set;10 reps   Hip Abduction Stengthening;Right;Left;1 set;10 reps;Knee straight   Step Down Hand Hold: 2  3" step; 1 rep each side   Step Down Limitations --  Rt knee buckled; increased pain   Functional Squat 1 set;10 reps  with UE support   Knee/Hip Exercises: Seated   Long Arc Quad Strengthening;Right;Left;1 set;10 reps   Modalities   Modalities Moist Heat   Moist Heat Therapy   Number Minutes Moist Heat 12 Minutes   Moist Heat Location Knee  bilat          PT Education - 05/01/15 1707    Education provided Yes   Education Details HEP - sidelying quad stretch    Person(s) Educated Patient   Methods Explanation;Handout    Comprehension Verbalized understanding;Returned demonstration             PT Long Term Goals - 04/26/15 1416    PT LONG TERM GOAL #1   Title Increase strength bilat LE's to 5-/5 to 5/5 06/05/15   Time 6   Period Weeks   Status On-going   PT LONG TERM GOAL #2   Title Increase functional activity level allowing patient to get in and out of chairs; ascend and descend stairs with less difficulty and decreased pain 06/05/15   Time 6   Period Weeks   Status On-going   PT LONG TERM GOAL #3   Title Patient I In HEP 06/05/15   Time 6   Period Weeks   Status On-going   PT LONG TERM GOAL #4   Title Improve FOTO to </= 52% limitation 06/05/15   Time 6   Period Weeks   Status On-going   PT LONG TERM GOAL #5   Title Patient tol ambulation for 10 min with min pain 06/05/15   Time 6   Period Weeks   Status On-going               Plan - 05/01/15 1704    Clinical Impression Statement Pt continues with weakness in bilat knees R>L; demonstrated some buckling with attempt at stepping down from 3" step.  Pt tolerated sidelying quad stretch the best of all positions offered.  Pt reported decrease of pain with stretches and further reduction with use of MHP.  Progressing towards goals.    Pt will benefit from skilled therapeutic intervention in order to improve on the following deficits Abnormal gait;Decreased strength;Decreased mobility;Decreased activity tolerance;Decreased endurance;Pain   Rehab Potential Good   PT Frequency 2x / week   PT Duration 6 weeks   PT Treatment/Interventions Patient/family education;ADLs/Self Care Home Management;Therapeutic exercise;Therapeutic activities;Neuromuscular re-education;Balance training;Cryotherapy;Electrical Stimulation;Iontophoresis 4mg /ml Dexamethasone;Moist Heat;Ultrasound;Dry needling   PT Next Visit Plan Continue progressive Stretching; strengthening bilat LE's; balance activites    Consulted and Agree with Plan of Care Patient         Problem List Patient Active Problem List   Diagnosis Date Noted  . Spondylolisthesis of lumbar region 05/02/2013  . Cervical spine disease 04/16/2013  . Unspecified constipation 10/13/2012  . Advanced care planning/counseling discussion 03/02/2012  . BPH with urinary obstruction 03/22/2011  . DIABETES MELLITUS, TYPE II, WITH NEUROLOGICAL COMPLICATIONS 0000000  . TINEA PEDIS 11/15/2009  . SPINAL STENOSIS, LUMBAR 06/03/2009  . BACK PAIN, CHRONIC 04/18/2009  . INTERMEDIATE CORONARY SYNDROME 08/29/2008  . HEART MURMUR, SYSTOLIC [id number]  . Cramp of limb 10/28/2007  . DEPRESSION, CHRONIC 06/24/2007  . OSTEOARTHRITIS 06/24/2007  .  CARPAL TUNNEL SYNDROME 03/24/2007  . HERPES SIMPLEX INFECTION 01/19/2007  . DIABETES MELLITUS 01/19/2007  . HYPERLIPIDEMIA 01/19/2007  . OBSTRUCTIVE SLEEP APNEA 01/19/2007  . HYPERTENSION 01/19/2007  . CORONARY ARTERY DISEASE 01/19/2007  . BENIGN PROSTATIC HYPERTROPHY, HX OF 01/19/2007  . PERCUTANEOUS TRANSLUMINAL CORONARY ANGIOPLASTY, HX OF 01/19/2007    Kerin Perna, PTA 05/01/2015 5:09 PM  [place], [place], [address] Phone: [phone]   Fax:  [phone]  Name: [name] MRN: [id number] Date of Birth: 1931/10/26

## 2015-05-01 NOTE — Patient Instructions (Signed)
Quadriceps Stretch    Pull left heel in toward buttocks until a comfortable stretch is felt in front of thigh. Hold _30___ seconds.  Use strap/leash over shoulder.  Repeat _2___ times per set. Do __1__ sets per session. Do __1-2__ sessions per day.   [facility] at [facility] [place], [place] [address]  [phone] (office) [phone] (fax)

## 2015-05-03 ENCOUNTER — Ambulatory Visit (INDEPENDENT_AMBULATORY_CARE_PROVIDER_SITE_OTHER): Payer: PPO | Admitting: Physical Therapy

## 2015-05-03 DIAGNOSIS — R29898 Other symptoms and signs involving the musculoskeletal system: Secondary | ICD-10-CM

## 2015-05-03 DIAGNOSIS — M2560 Stiffness of unspecified joint, not elsewhere classified: Secondary | ICD-10-CM

## 2015-05-03 DIAGNOSIS — M623 Immobility syndrome (paraplegic): Secondary | ICD-10-CM | POA: Diagnosis not present

## 2015-05-03 DIAGNOSIS — M25562 Pain in left knee: Secondary | ICD-10-CM

## 2015-05-03 DIAGNOSIS — M25561 Pain in right knee: Secondary | ICD-10-CM

## 2015-05-03 DIAGNOSIS — R6889 Other general symptoms and signs: Secondary | ICD-10-CM

## 2015-05-03 DIAGNOSIS — Z7409 Other reduced mobility: Secondary | ICD-10-CM | POA: Diagnosis not present

## 2015-05-03 DIAGNOSIS — R531 Weakness: Secondary | ICD-10-CM

## 2015-05-03 NOTE — Therapy (Addendum)
[place], [place], [address] Phone: [phone]   Fax:  [phone]  Physical Therapy Treatment  Patient Details  Name: [name] MRN: [id number] Date of Birth: May 31, 1931 Referring Provider: Dr. Better [name]  Encounter Date: 05/03/2015      PT End of Session - 05/03/15 1200    Visit Number 4   Number of Visits 12   Date for PT Re-Evaluation 06/05/15   PT Start Time 1153  pt arrived late   PT Stop Time 1240   PT Time Calculation (min) 47 min   Activity Tolerance Patient tolerated treatment well      Past Medical History  Diagnosis Date  . HERPES SIMPLEX INFECTION 01/19/2007  . TINEA PEDIS 11/15/2009  . DIABETES MELLITUS 01/19/2007  . HYPERLIPIDEMIA 01/19/2007  . DEPRESSION, CHRONIC 06/24/2007  . Carpal tunnel syndrome 03/24/2007  . HYPERTENSION 01/19/2007  . Intermediate coronary syndrome ([place]) 08/29/2008  . CORONARY ARTERY DISEASE 01/19/2007  . URI 01/11/2009  . OSTEOARTHRITIS 06/24/2007  . SHOULDER PAIN, LEFT 03/24/2007  . SPINAL STENOSIS, LUMBAR 06/03/2009  . BACK PAIN, CHRONIC 04/18/2009  . PLANTAR FASCIITIS, RIGHT 01/19/2007  . LEG CRAMPS, NOCTURNAL 10/28/2007  . HEART MURMUR, SYSTOLIC 2/29/7989  . Diarrhea 02/23/2007  . BENIGN PROSTATIC HYPERTROPHY, HX OF 01/19/2007  . PERCUTANEOUS TRANSLUMINAL CORONARY ANGIOPLASTY, HX OF 01/19/2007  . Other postprocedural status(V45.89) 01/19/2007  . DIABETES MELLITUS, TYPE II, WITH NEUROLOGICAL COMPLICATIONS 05/15/1939  . Leg cramps, sleep related 08/05/2010  . OBSTRUCTIVE SLEEP APNEA 01/19/2007    does not use CPAP    Past Surgical History  Procedure Laterality Date  . Arthroscopy, knee of hx    . Plastic joint in thumb    . Inguinal herniorrhapy, hx    . Percutaneous transluminal coronary angioplasty, hx of  2004    Dr. Lyndel Safe  . Ptca/stent  2004  . Tonsillectomy    . Vasectomy    . Colonoscopy    . Cardiac catheterization    . Nasal sinus  surgery      There were no vitals filed for this visit.  Visit Diagnosis:  Arthralgia of both knees  Stiffness due to immobility  Weakness of both lower limbs  Decreased strength, endurance, and mobility          [facility] PT Assessment - 05/03/15 0001    Observation/Other Assessments   Focus on Therapeutic Outcomes (FOTO)  64% limited   Strength   Right Hip Flexion --  5-/5   Right Hip Extension 4+/5   Right Hip ABduction --  5-/5   Left Hip Flexion --  5-/5   Left Hip Extension 4+/5   Left Hip ABduction --  5-/5   Left Hip ADduction --  5-/5   Right Knee Flexion 5/5   Right Knee Extension --  5-/5   Left Knee Flexion 5/5   Left Knee Extension --  5-/5          [facility] Adult PT Treatment/Exercise - 05/03/15 0001    Knee/Hip Exercises: Stretches   Passive Hamstring Stretch Right;Left;2 reps;30 seconds   Passive Hamstring Stretch Limitations seated with back straight, leaning forward    Quad Stretch Right;Left;2 reps;30 seconds   Quad Stretch Limitations (sidelying)   Gastroc Stretch Both;2 reps;20 seconds   Knee/Hip Exercises: Aerobic   Nustep L3: 6 min    Knee/Hip Exercises: Standing   Heel Raises Both;2 sets;10 reps   Hip Abduction Stengthening;Right;Left;1 set;10 reps;Knee straight   Hip  Extension Right;Left;1 set;10 reps;Knee straight   Knee/Hip Exercises: Seated   Long Arc Quad Strengthening;Right;Left;2 sets;10 reps   Long Arc Quad Weight 3 lbs.   Long [employer] Limitations --   Other Seated Knee/Hip Exercises marching    Marching Weights 3 lbs.   Modalities   Modalities --  pt declined                PT Education - 05/03/15 1250    Education provided Yes   Education Details HEP - standing hip exercise   Person(s) Educated Patient;Spouse   Methods Explanation;Handout   Comprehension Verbalized understanding;Returned demonstration             PT Long Term Goals - 05/03/15 1238    PT LONG TERM GOAL #1   Title Increase strength  bilat LE's to 5-/5 to 5/5 06/05/15   Time 6   Period Weeks   Status Partially Met   PT LONG TERM GOAL #2   Title Increase functional activity level allowing patient to get in and out of chairs; ascend and descend stairs with less difficulty and decreased pain 06/05/15   Time 6   Period Weeks   Status Achieved   PT LONG TERM GOAL #3   Title Patient I In HEP 06/05/15   Time 6   Period Weeks   Status Achieved   PT LONG TERM GOAL #4   Title Improve FOTO to </= 52% limitation 06/05/15   Time 6   Period Weeks   Status Not Met  scored 64% limited   PT LONG TERM GOAL #5   Title Patient tol ambulation for 10 min with min pain 06/05/15   Time 6   Period Weeks   Status Partially Met  dependent on day               Plan - 05/03/15 1251    Clinical Impression Statement Pt demonstrated improved LE strength compared to initial eval. Pt tolerated all exercises without increase in pain.  Lengthy discussion regarding compliance to HEP to increase strength/flexibility in LE and the rehab process for possible knee replacement (in future) Pt has partially met his goals. He (and his wife) are satisfied with current level of function and request to d/c to HEP at this time.    Pt will benefit from skilled therapeutic intervention in order to improve on the following deficits Abnormal gait;Decreased strength;Decreased mobility;Decreased activity tolerance;Decreased endurance;Pain   Rehab Potential Good   PT Frequency 2x / week   PT Duration 6 weeks   PT Treatment/Interventions Patient/family education;ADLs/Self Care Home Management;Therapeutic exercise;Therapeutic activities;Neuromuscular re-education;Balance training;Cryotherapy;Electrical Stimulation;Iontophoresis 25m/ml Dexamethasone;Moist Heat;Ultrasound;Dry needling   PT Next Visit Plan Spoke to supervising PT; will d/c to HEP at this time.     Consulted and Agree with Plan of Care Patient        Problem List Patient Active Problem List    Diagnosis Date Noted  . Spondylolisthesis of lumbar region 05/02/2013  . Cervical spine disease 04/16/2013  . Unspecified constipation 10/13/2012  . Advanced care planning/counseling discussion 03/02/2012  . BPH with urinary obstruction 03/22/2011  . DIABETES MELLITUS, TYPE II, WITH NEUROLOGICAL COMPLICATIONS 025/95/6387 . TINEA PEDIS 11/15/2009  . SPINAL STENOSIS, LUMBAR 06/03/2009  . BACK PAIN, CHRONIC 04/18/2009  . INTERMEDIATE CORONARY SYNDROME 08/29/2008  . HEART MURMUR, SYSTOLIC 056/43/3295 . Cramp of limb 10/28/2007  . DEPRESSION, CHRONIC 06/24/2007  . OSTEOARTHRITIS 06/24/2007  . CARPAL TUNNEL SYNDROME 03/24/2007  . HERPES SIMPLEX INFECTION 01/19/2007  .  DIABETES MELLITUS 01/19/2007  . HYPERLIPIDEMIA 01/19/2007  . OBSTRUCTIVE SLEEP APNEA 01/19/2007  . HYPERTENSION 01/19/2007  . CORONARY ARTERY DISEASE 01/19/2007  . BENIGN PROSTATIC HYPERTROPHY, HX OF 01/19/2007  . PERCUTANEOUS TRANSLUMINAL CORONARY ANGIOPLASTY, HX OF 01/19/2007    Kerin Perna, PTA 05/03/2015 12:58 PM  [place], [place], [address] Phone: [phone]   Fax:  [phone]  Name: [name] MRN: [id number] Date of Birth: 07-Jan-1932    G-code completed for discharge visit.  Patient is not 100% compliant with HEP and this has limited his progress.  Jeral Pinch, PT 05/03/2015 1:13 PM  PHYSICAL THERAPY DISCHARGE SUMMARY  Visits from Start of Care: 4  Current functional level related to goals / functional outcomes: See bove  Remaining deficits: Intermittent pain   Education / Equipment: HEP Plan: Patient agrees to discharge.  Patient goals were partially met. Patient is being discharged due to being pleased with the current functional level.  ?????   Jeral Pinch, PT 05/22/2015 10:29 AM

## 2015-05-03 NOTE — Patient Instructions (Signed)
Knee High   Holding stable object, raise knee to hip level, then lower knee. Repeat with other knee. Complete __10_ repetitions. Do __2__ sets per session.  ABDUCTION: Standing (Active)   Stand, feet flat. Lift right leg out to side. Use _0__ lbs. Complete __10_ repetitions. Perform __2_ sets per session.   EXTENSION: Standing (Active)  Stand, both feet flat. Draw right leg behind body as far as possible. Use 0___ lbs. Complete 10 repetitions. Perform __2_ sessions per day.   [place] at [facility] [phone] (office) [phone] (fax)

## 2015-05-06 ENCOUNTER — Encounter: Payer: PPO | Admitting: Physical Therapy

## 2015-05-08 ENCOUNTER — Encounter: Payer: PPO | Admitting: Physical Therapy

## 2015-05-14 DIAGNOSIS — M159 Polyosteoarthritis, unspecified: Secondary | ICD-10-CM | POA: Diagnosis not present

## 2015-05-14 DIAGNOSIS — I5032 Chronic diastolic (congestive) heart failure: Secondary | ICD-10-CM | POA: Diagnosis not present

## 2015-06-03 ENCOUNTER — Other Ambulatory Visit (HOSPITAL_COMMUNITY): Payer: PPO

## 2015-06-07 DIAGNOSIS — H401111 Primary open-angle glaucoma, right eye, mild stage: Secondary | ICD-10-CM | POA: Diagnosis not present

## 2015-06-07 DIAGNOSIS — H401121 Primary open-angle glaucoma, left eye, mild stage: Secondary | ICD-10-CM | POA: Diagnosis not present

## 2015-06-07 DIAGNOSIS — H40013 Open angle with borderline findings, low risk, bilateral: Secondary | ICD-10-CM | POA: Diagnosis not present

## 2015-06-11 DIAGNOSIS — J0100 Acute maxillary sinusitis, unspecified: Secondary | ICD-10-CM | POA: Diagnosis not present

## 2015-06-11 DIAGNOSIS — H00032 Abscess of right lower eyelid: Secondary | ICD-10-CM | POA: Diagnosis not present

## 2015-06-13 DIAGNOSIS — H00011 Hordeolum externum right upper eyelid: Secondary | ICD-10-CM | POA: Diagnosis not present

## 2015-06-25 DIAGNOSIS — M170 Bilateral primary osteoarthritis of knee: Secondary | ICD-10-CM | POA: Diagnosis not present

## 2015-07-19 DIAGNOSIS — H401191 Primary open-angle glaucoma, unspecified eye, mild stage: Secondary | ICD-10-CM | POA: Diagnosis not present

## 2015-07-22 ENCOUNTER — Encounter: Payer: Self-pay | Admitting: Family Medicine

## 2015-07-22 ENCOUNTER — Ambulatory Visit (INDEPENDENT_AMBULATORY_CARE_PROVIDER_SITE_OTHER): Payer: PPO | Admitting: Family Medicine

## 2015-07-22 VITALS — BP 128/86 | HR 52 | Temp 97.6°F | Ht 65.0 in | Wt 179.2 lb

## 2015-07-22 DIAGNOSIS — M159 Polyosteoarthritis, unspecified: Secondary | ICD-10-CM | POA: Diagnosis not present

## 2015-07-22 DIAGNOSIS — J449 Chronic obstructive pulmonary disease, unspecified: Secondary | ICD-10-CM | POA: Insufficient documentation

## 2015-07-22 DIAGNOSIS — I10 Essential (primary) hypertension: Secondary | ICD-10-CM | POA: Diagnosis not present

## 2015-07-22 DIAGNOSIS — I5032 Chronic diastolic (congestive) heart failure: Secondary | ICD-10-CM | POA: Diagnosis not present

## 2015-07-22 DIAGNOSIS — I5030 Unspecified diastolic (congestive) heart failure: Secondary | ICD-10-CM | POA: Insufficient documentation

## 2015-07-22 DIAGNOSIS — E1149 Type 2 diabetes mellitus with other diabetic neurological complication: Secondary | ICD-10-CM | POA: Diagnosis not present

## 2015-07-22 DIAGNOSIS — R2681 Unsteadiness on feet: Secondary | ICD-10-CM

## 2015-07-22 DIAGNOSIS — J441 Chronic obstructive pulmonary disease with (acute) exacerbation: Secondary | ICD-10-CM

## 2015-07-22 MED ORDER — OXYCODONE-ACETAMINOPHEN 5-325 MG PO TABS: 1.0000 | ORAL_TABLET | Freq: Three times a day (TID) | ORAL | Status: DC | PRN

## 2015-07-22 MED ORDER — MORPHINE SULFATE ER 15 MG PO TBCR: 15.0000 mg | EXTENDED_RELEASE_TABLET | Freq: Two times a day (BID) | ORAL | Status: DC

## 2015-07-22 NOTE — Patient Instructions (Signed)
Thing we discussed: Fall precautions, caution with pain meds, monitor for side effects. Re-schedule appt with ortho to discuss knee replacement. I will see you in 2-3 weeks to follow on pain and to do labs if needed.  No changes in Percocet. MS Contin started.

## 2015-07-22 NOTE — Progress Notes (Addendum)
Subjective:    Patient ID: [name], male    DOB: 01-Sep-1931, 82 y.o.   MRN: [id number]  HPI   Mr.  [name] is a 82 y.o.male here today with his wife to re-establish care with me. He is a fore pt of [facility], hx of HTN,OA,HLD,COPD,OSA,and CAD.  Generalized OA, mainly knees. He already saw ortho and TKR was recommended.  Last OV while I was with [facility], he was referred to [facility] because he wanted to try knee injections with a new medication for his severe knee OA, intra-articular steroid injections were not longer helping. Taking Percocet helps with pain, 6/10 from 9/10 w/o medication. He has not had appt scheduled, even though insurance already approved it. PT helped but he is not doing the exercises at home.  He is on percocet 5-325 mg tid as needed, helps for 4 hours or so. Pain is severe, limits ROM and daily activities.+ Stiffness that cause balance issues, he has refused cane or walker, denies any fall recently. He has not noted side effects from medication. He was on Hydrocodone before but felt like it was not longer helping. He did not tolerated Cymbalta, caused GI side effects. He denies depression but pain causes anxiety, he gets frustrated because he cannot participate in certain activities like prolnged walks and playing with his grandchildren.  Hypertension: Currently he is on Metoprolol and Valsartan. + Hx of CHF, he is on Furosemide daily.Echo was orer a couple month ago, he cancelled appt, he would like to wait until he decides about knee surgery, in which case we have discussed cardiologiy evaluation for pre-op cardiac evaluation given his Hx of CHF,CAD, and his age.  He denies orthopnea or PND. Marland Kitchen He is taking medications as instructed, no side effects reported.  He has not noted unusual headache, visual changes, exertional chest pain, dyspnea,  dyspnea, focal weakness, or edema. Tolerating medications well with no major side effects  reported. + OSA, he is not on CPAP, has not tolerated.   Diabetes Mellitus II: He is currently on Glipizide 5 mg daily. Metformin was discontinued because hx of diarrhea. He is not sure if medication was aggravating symptom. Problem has been stable. He is checking BS's, denies any hypoglycemia, BS's 150's in average. Last HgA1C reported < 3 months and at goal. He is tolerating medications well. He denies abdominal pain, nausea, vomiting, polydipsia, polyuria, or polyphagia. Hx of peripheral neuropathy, currently he is on gabapentin, which has also helped with muscle spasms.  + feet numbness bilateral. No tingling or burning.   Review of Systems  Constitutional: Positive for fatigue (no more than usual). Negative for fever, activity change, appetite change and unexpected weight change.  HENT: Negative for congestion, mouth sores, nosebleeds, sore throat and trouble swallowing.   Eyes: Negative for redness and visual disturbance.  Respiratory: Negative for apnea, cough, shortness of breath and wheezing.        Hx of OSA.   Cardiovascular: Positive for leg swelling (at baseline). Negative for chest pain and palpitations.       No orthopnea or PND.  Gastrointestinal: Positive for diarrhea (chronic and unchanged). Negative for nausea, vomiting, abdominal pain, blood in stool and abdominal distention.  Genitourinary: Negative for dysuria, hematuria and decreased urine volume.  Musculoskeletal: Positive for back pain, joint swelling, arthralgias (shoulders, hips, and knees mainly) and gait problem.       + muscle spasm. Joint stiffness, mainly knees  Neurological: Positive for numbness. Negative  for dizziness, seizures, weakness and headaches.  Psychiatric/Behavioral: Positive for sleep disturbance (due to pain mainly). Negative for suicidal ideas, hallucinations and confusion. The patient is nervous/anxious.      Medication List              aspirin 81 MG EC tablet  Take 81 mg by  mouth daily.     doxazosin 2 MG tablet  Commonly known as:  CARDURA  Take by mouth.     finasteride 5 MG tablet  Commonly known as:  PROSCAR  Take 1 tablet (5 mg total) by mouth daily.     FREESTYLE LITE TEST VI     ONE TOUCH ULTRA TEST test strip  Generic drug:  glucose blood     furosemide 40 MG tablet  Commonly known as:  LASIX  TAKE 1 TABLET BY MOUTH DAILY     gabapentin 300 MG capsule  Commonly known as:  NEURONTIN  Take 300 mg by mouth 3 (three) times daily. 300 mg during the day and 600 mg qhs     glipiZIDE 5 MG 24 hr tablet  Commonly known as:  GLUCOTROL XL        metoprolol 100 MG tablet  Commonly known as:  LOPRESSOR  Take 100 mg by mouth 2 (two) times daily.        multivitamin tablet  Take 1 tablet by mouth daily.     oxyCODONE-acetaminophen 5-325 MG tablet  Commonly known as:  PERCOCET/ROXICET  Take 1 tablet by mouth every 8 (eight) hours as needed for severe pain.     tamsulosin 0.4 MG Caps capsule  Commonly known as:  FLOMAX  Take 0.4 mg by mouth daily after supper.     timolol 0.5 % ophthalmic gel-forming  Commonly known as:  TIMOPTIC-XR     valsartan 320 MG tablet  Commonly known as:  DIOVAN     VITAMIN B 12 PO  Take by mouth.         Past Medical History  Diagnosis Date  . HERPES SIMPLEX INFECTION 01/19/2007  . TINEA PEDIS 11/15/2009  . DIABETES MELLITUS 01/19/2007  . HYPERLIPIDEMIA 01/19/2007  . DEPRESSION, CHRONIC 06/24/2007  . Carpal tunnel syndrome 03/24/2007  . HYPERTENSION 01/19/2007  . Intermediate coronary syndrome ([place]) 08/29/2008  . CORONARY ARTERY DISEASE 01/19/2007  . URI 01/11/2009  . OSTEOARTHRITIS 06/24/2007  . SHOULDER PAIN, LEFT 03/24/2007  . SPINAL STENOSIS, LUMBAR 06/03/2009  . BACK PAIN, CHRONIC 04/18/2009  . PLANTAR FASCIITIS, RIGHT 01/19/2007  . LEG CRAMPS, NOCTURNAL 10/28/2007  . HEART MURMUR, SYSTOLIC 123XX123  . Diarrhea 02/23/2007  . BENIGN PROSTATIC HYPERTROPHY, HX OF 01/19/2007  . PERCUTANEOUS TRANSLUMINAL  CORONARY ANGIOPLASTY, HX OF 01/19/2007  . Other postprocedural status(V45.89) 01/19/2007  . DIABETES MELLITUS, TYPE II, WITH NEUROLOGICAL COMPLICATIONS 99991111  . Leg cramps, sleep related 08/05/2010  . OBSTRUCTIVE SLEEP APNEA 01/19/2007    does not use CPAP    Social History   Social History  . Marital Status: Married    Spouse Name: N/A  . Number of Children: N/A  . Years of Education: N/A   Occupational History  . truck Educational psychologist for [employer]  . Smoking status: Former Smoker    Quit date: 04/14/1983  . Smokeless tobacco: Never Used     Comment: stopped 1985  . Alcohol Use: Yes     Comment: 2-3 times a week - wine, scotch  . Drug Use: No  . Sexual  Activity: Not Asked   Other Topics Concern  . None   Social History Narrative   Married in 1959 - 83yrs, divorced; married 1974 - 1.5 years, divorced; married 1985 1 daughter 71; 66 step daughters; 2 grandchildren.    Filed Vitals:   07/22/15 1407  BP: 128/86  Pulse: 52  Temp: 97.6 F (36.4 C)   Body mass index is 29.83 kg/(m^2).  SpO2 Readings from Last 3 Encounters:  07/22/15 96%  06/21/13 96%  05/23/13 96%       Objective:   Physical Exam  Constitutional: He is oriented to person, place, and time. He appears well-developed and well-nourished. No distress.  HENT:  Head: Atraumatic.  Mouth/Throat: Oropharynx is clear and moist.  Eyes: Conjunctivae and EOM are normal. Pupils are equal, round, and reactive to light.  Neck: No spinous process tenderness present.  Cardiovascular: Normal rate and regular rhythm.   Murmur (SEM I-II/VI RUSB and LUSB) heard.  Systolic murmur is present  Pulses:      Dorsalis pedis pulses are 2+ on the right side, and 2+ on the left side.  HR 64/min by my count.  Pulmonary/Chest: Effort normal. He has no wheezes. He has no rales.  Abdominal: Soft. He exhibits no distension and no mass. There is no tenderness.  Musculoskeletal: He  exhibits edema (LE bilateral pitting 2+).       Right knee: He exhibits decreased range of motion (mild limitation of flexion) and effusion. He exhibits no erythema.       Left knee: He exhibits decreased range of motion (mild to moderate limitation of flexion, elicits pain). He exhibits no effusion and no erythema.  Knee crepitus L>R.  Lymphadenopathy:    He has no cervical adenopathy.  Neurological: He is alert and oriented to person, place, and time. Coordination normal.  Diabetic Foot Exam :  Visual Inspection: normal  Sensation Testing  Pulse Check present. Comments  Decreased monofilament test bilateral, plantar      Skin: Skin is warm.  Psychiatric: His speech is normal. His mood appears anxious. His affect is not labile. He expresses no suicidal plans.       Assessment & Plan:   Paresh was seen today for new patient (initial visit).  Diagnoses and all orders for this visit:  Generalized osteoarthritis of multiple sites Mainly knees, he will schedule ortho f/u appt and discuss TKR option. If he decides to pursue it he will let me know. I do not think at this stage knee pain will improved greatly with knee injections. MS Contin added, some side effects discussed. Percocet unchanged. F/U in 2-3 weeks, when narcotic contract will be signed if we agree with continuing med and if it helps.  -     morphine (MS CONTIN) 15 MG 12 hr tablet; Take 1 tablet (15 mg total) by mouth every 12 (twelve) hours. -     oxyCODONE-acetaminophen (PERCOCET/ROXICET) 5-325 MG tablet; Take 1 tablet by mouth every 8 (eight) hours as needed for severe pain.  Essential hypertension Adequately controlled. No changes in current management. DASH diet recommended. Eye exam recommended annually. F/U in 4 months.  Chronic diastolic congestive heart failure ([place]) If he decides to have surgery cardiology referral will be placed. Asymptomatic. No changes in current management.  Diabetes mellitus type  2 with neurological manifestations ([place]) 04/16/2015 - Hemoglobin A1c Result 6.6 (7.2)  Seems to be adequately controlled. No changes today. Caution with hypoglycemia. Recently he had eye exam: mild right open angle glaucoma,  no diabetic retinopathy. Foot care discussed. HgA1C in 5 or 09/2015.  Unsteady gait Due to generalized OA, mainly knee. Fall precautions discussed. Gilford Rile will help with balance.   -     For home use only DME 4 wheeled rolling walker with seat [id number])   May need labs next OV, wife will let me know when last labs were done, in which case we will arrange labs before OV.  Geraldine Tesar G. [name], MD  [facility]. [place] office.

## 2015-07-22 NOTE — Progress Notes (Signed)
Pre visit review using our clinic review tool, if applicable. No additional management support is needed unless otherwise documented below in the visit note. 

## 2015-07-24 DIAGNOSIS — M170 Bilateral primary osteoarthritis of knee: Secondary | ICD-10-CM | POA: Diagnosis not present

## 2015-07-24 DIAGNOSIS — M1712 Unilateral primary osteoarthritis, left knee: Secondary | ICD-10-CM | POA: Diagnosis not present

## 2015-07-24 DIAGNOSIS — M1711 Unilateral primary osteoarthritis, right knee: Secondary | ICD-10-CM | POA: Diagnosis not present

## 2015-07-25 IMAGING — CR DG CHEST 2V
2 series · 2 of 2 positions shown · non-contrast
Comparison: PA chest film from an abdominal series of October 13, 2012
and a PA and lateral chest x-ray dated February 06, 2011.

CLINICAL DATA: Preoperative study prior to lumbar fusion, history
of coronary artery disease and diabetes and coronary artery disease.

EXAM:
CHEST  2 VIEW

[w chest pa]
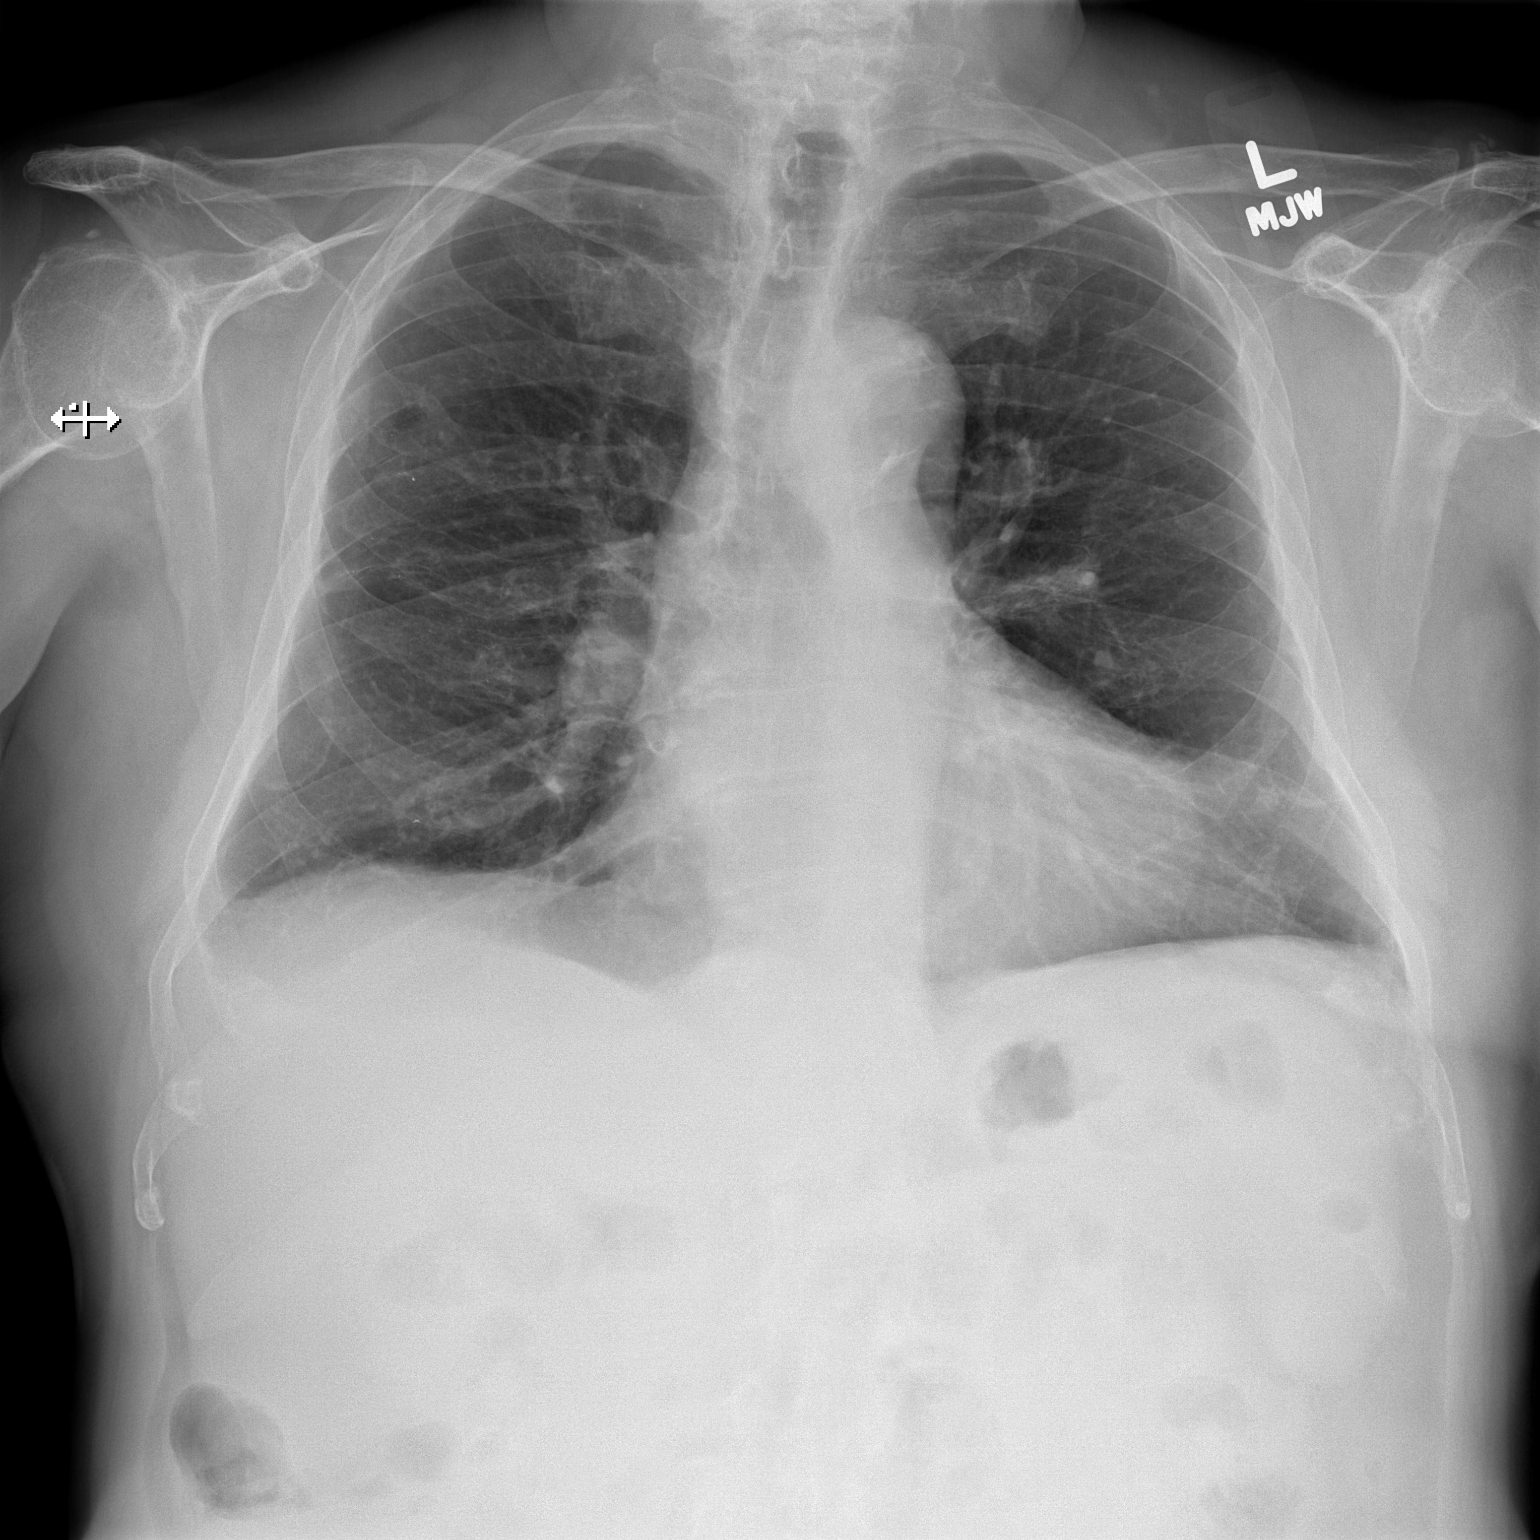

[w chest lat]
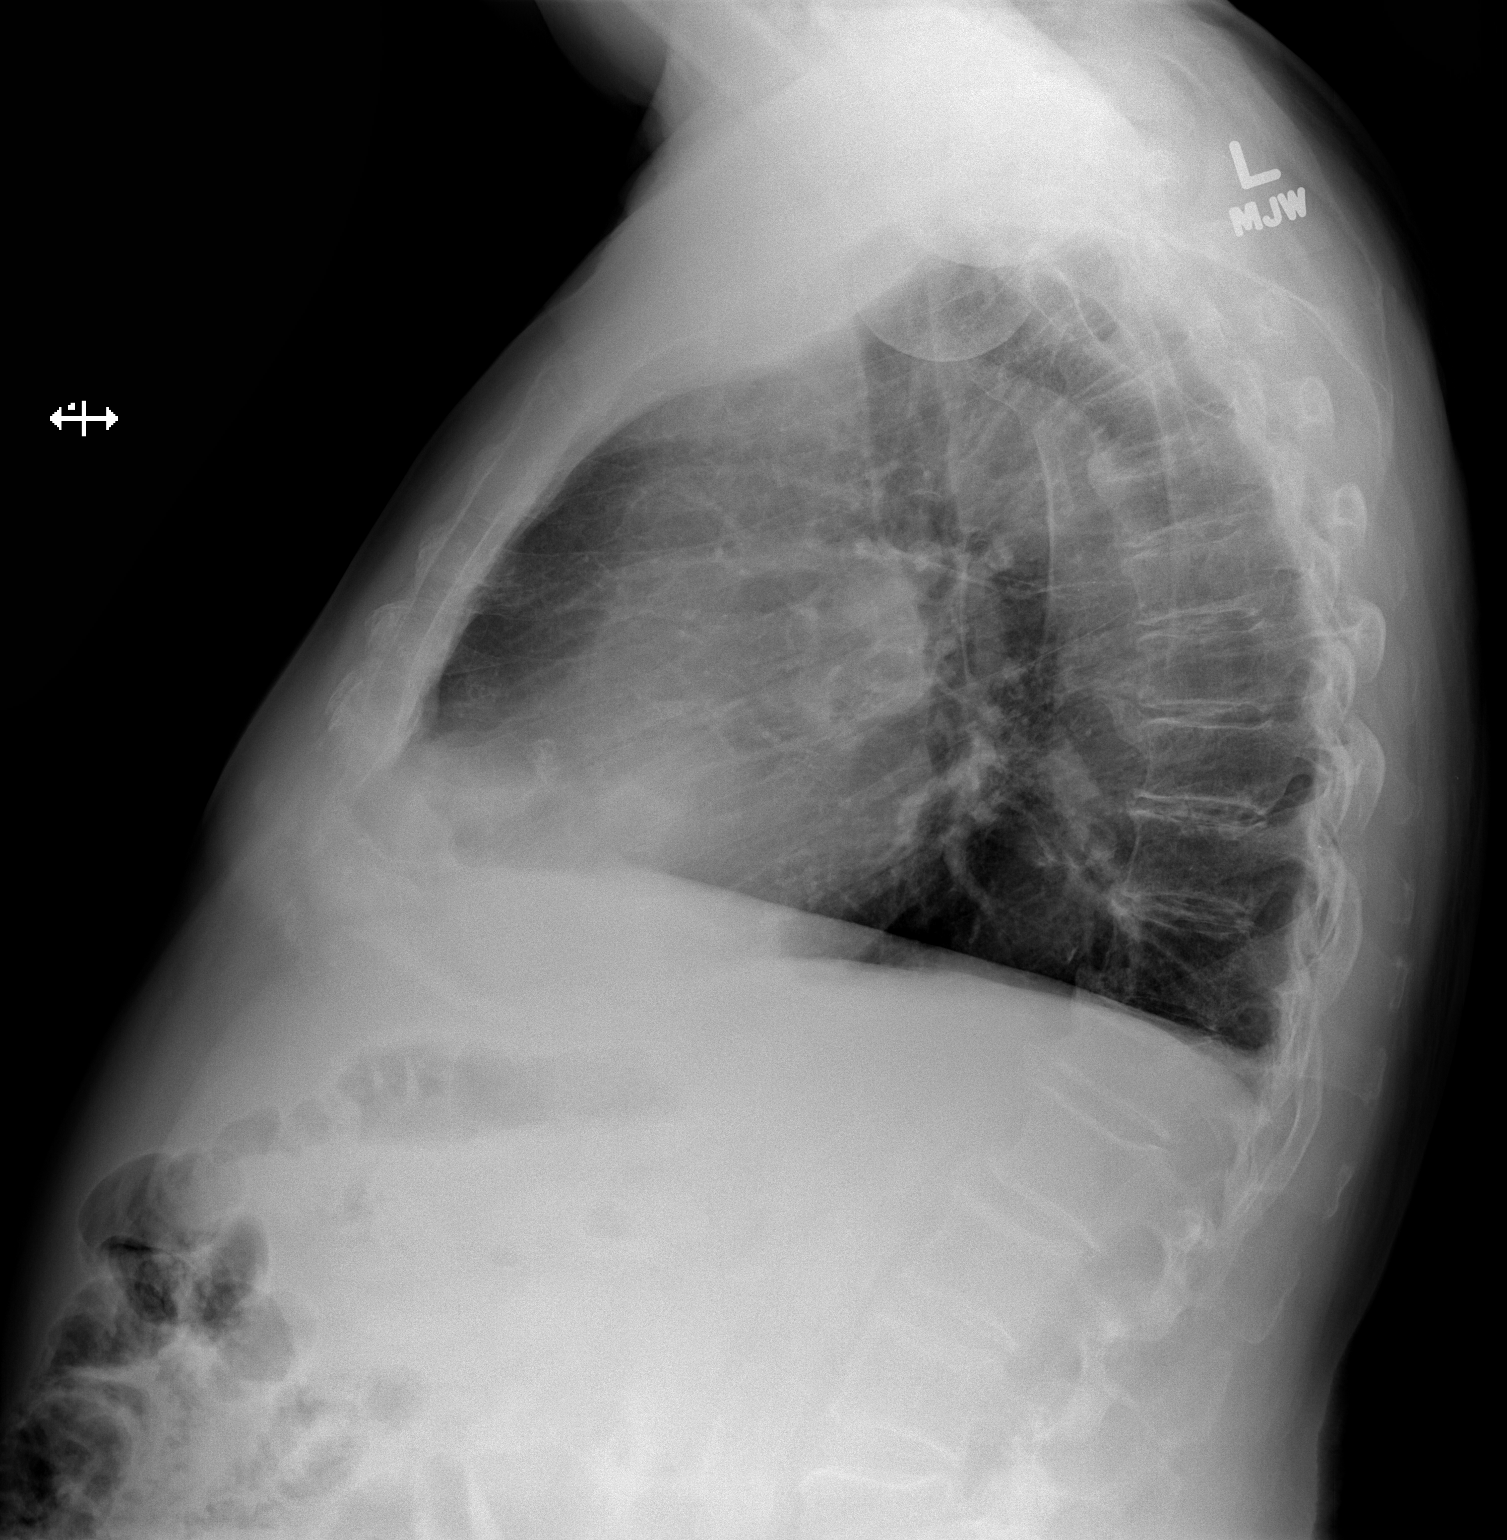

[2 of 2 positions shown; findings below may reference images not displayed]

FINDINGS: The lungs are well-expanded. There is mild stable hemidiaphragm
flattening on the lateral film. The pulmonary interstitial markings
are mildly prominent though stable bilaterally. Cardiopericardial
silhouette is normal in size. The mediastinum is normal in width.
The central pulmonary vascularity is mildly prominent though stable.
The observed portions of the bony thorax appear normal.
IMPRESSION: There is no evidence of pneumonia nor CHF. There is likely
underlying COPD or reactive airway disease.

## 2015-07-30 ENCOUNTER — Other Ambulatory Visit: Payer: Self-pay | Admitting: Family Medicine

## 2015-07-30 DIAGNOSIS — I5030 Unspecified diastolic (congestive) heart failure: Secondary | ICD-10-CM

## 2015-07-30 NOTE — Progress Notes (Signed)
Spoke with pt and he is aware. 

## 2015-08-08 ENCOUNTER — Encounter: Payer: Self-pay | Admitting: Family Medicine

## 2015-08-12 ENCOUNTER — Ambulatory Visit (INDEPENDENT_AMBULATORY_CARE_PROVIDER_SITE_OTHER): Payer: PPO | Admitting: Family Medicine

## 2015-08-12 ENCOUNTER — Encounter: Payer: Self-pay | Admitting: Family Medicine

## 2015-08-12 ENCOUNTER — Ambulatory Visit: Payer: Self-pay | Admitting: Family Medicine

## 2015-08-12 VITALS — BP 140/80 | HR 56 | Temp 97.6°F | Resp 12 | Ht 65.0 in | Wt 174.0 lb

## 2015-08-12 DIAGNOSIS — M159 Polyosteoarthritis, unspecified: Secondary | ICD-10-CM | POA: Diagnosis not present

## 2015-08-12 DIAGNOSIS — G4700 Insomnia, unspecified: Secondary | ICD-10-CM

## 2015-08-12 DIAGNOSIS — E1149 Type 2 diabetes mellitus with other diabetic neurological complication: Secondary | ICD-10-CM | POA: Diagnosis not present

## 2015-08-12 DIAGNOSIS — I10 Essential (primary) hypertension: Secondary | ICD-10-CM | POA: Diagnosis not present

## 2015-08-12 DIAGNOSIS — E7800 Pure hypercholesterolemia, unspecified: Secondary | ICD-10-CM

## 2015-08-12 DIAGNOSIS — J449 Chronic obstructive pulmonary disease, unspecified: Secondary | ICD-10-CM

## 2015-08-12 LAB — LIPID PANEL
CHOL/HDL RATIO: 5
Cholesterol: 197 mg/dL (ref 0–200)
HDL: 37.5 mg/dL — ABNORMAL LOW (ref >39.00)
Triglycerides: 411 mg/dL — ABNORMAL HIGH (ref 0.0–149.0)

## 2015-08-12 LAB — MICROALBUMIN / CREATININE URINE RATIO
Creatinine,U: 116.7 mg/dL
Microalb Creat Ratio: 0.7 mg/g (ref 0.0–30.0)
Microalb, Ur: 0.8 mg/dL (ref 0.0–1.9)

## 2015-08-12 LAB — HEMOGLOBIN A1C: HEMOGLOBIN A1C: 6.3 % (ref 4.6–6.5)

## 2015-08-12 LAB — LDL CHOLESTEROL, DIRECT: LDL DIRECT: 92 mg/dL

## 2015-08-12 MED ORDER — OXYCODONE-ACETAMINOPHEN 5-325 MG PO TABS: 1.0000 | ORAL_TABLET | Freq: Three times a day (TID) | ORAL | Status: DC | PRN

## 2015-08-12 MED ORDER — TRAZODONE HCL 50 MG PO TABS: 25.0000 mg | ORAL_TABLET | Freq: Every evening | ORAL | Status: DC | PRN

## 2015-08-12 NOTE — Progress Notes (Signed)
Subjective:    Patient ID: [name], male    DOB: 17-Feb-1932, 82 y.o.   MRN: [id number]  HPI   [name] is a 82 y.o.male here today with his wife to follow on his last OV, 07/22/15.  Also since his last visit he has seen ortho for knee pain and has agreed with right TKR, has appt with cardiologists 08/29/15 for cardiac clearance. Today we ar also following on some of his chronic medical problems. He has no new complaints today.  OA/chronic pain: Last OV, MS Contin added, he did not noted major difference in pain level, although he states that he felt "better", cannot specify benefit. Percocet helps some, he does not take it tid, still has 10 tabs left from last refill > 30 days ago, states that does not help much, sometimes he take 1/2 tab instead 1 tab.He denies side effects.  He also received a call from pain management, referred to provider a couple month ago because he is interested in trying "a new shot" that is supposed to help with knee OA for up to 6 months.  C/O insomnia, feeling tired, taking naps during the day.He attributes sleep problems to pain. + Anxiety due to pain, he did not tolerate Cymbalta. He drinks a glass of alcohol at bedtime, which seems to help him rest, denies any Hx of alcoholism or high alcohol intake.   Diabetes Mellitus II: Heis currently on Glipizide 5 mg daily. Problem has been stable. He is checking BS's, denies any hypoglycemia,150's. He is tolerating medications well. He denies abdominal pain, nausea, vomiting, polydipsia, polyuria, or polyphagia. He has intermittent numbness LE bilateral, stable. No burning.   Lab Results  Component Value Date   CREATININE 0.79 05/03/2013   BUN 17 05/03/2013   NA 141 05/03/2013   K 3.8 05/03/2013   CL 102 05/03/2013   CO2 25 05/03/2013    Lab Results  Component Value Date   HGBA1C 6.6 04/16/2015   Lab Results  Component Value Date   MICROALBUR 5.4* 09/15/2006    Hypertension:  Currently he is on Valsartan 320 mg, Cardura 2 mg , and Metoprolol Succinate 100 mg 2 tabs daily. He checks BP at home and reporting normal readings.  He is taking medications as instructed, no side effects reported.  He has not noted unusual headache, visual changes, exertional chest pain, dyspnea,  focal weakness, or worsening LE edema. No orthopnea or PND, sleeps on a pillow. Hx of CAD and CHF.  COPD: He is not using Breo daily but rather prn, also has Albuterol inh. Has not needed it in months. + Former smoker. Hx of OSA, not wearing CPAP because could not tolerate mask.   Hyperlipidemia: He is not on statin, not sure if he did not tolerate well in the past Following a low fat diet, improved diet since his last OV. Last FLP last year, not sure about date.   Lab Results  Component Value Date   CHOL 203* 09/28/2012   HDL 45.50 09/28/2012   LDLCALC 50 03/14/2012   LDLDIRECT 99.1 09/28/2012   TRIG 297.0* 09/28/2012   CHOLHDL 4 09/28/2012      Review of Systems  Constitutional: Positive for fatigue (no more than usual). Negative for fever, appetite change and unexpected weight change.  HENT: Negative for mouth sores, nosebleeds, sore throat and trouble swallowing.   Eyes: Negative for photophobia and visual disturbance.  Respiratory: Negative for apnea, cough, shortness of breath and wheezing.  Hx of OSA.   Cardiovascular: Positive for leg swelling (at baseline). Negative for chest pain and palpitations.       No orthopnea or PND.  Gastrointestinal: Negative for nausea, vomiting, abdominal pain, blood in stool and abdominal distention.       No changes in bowel habits.  Endocrine: Negative for polydipsia, polyphagia and polyuria.  Genitourinary: Positive for frequency (hx of BPH). Negative for dysuria, hematuria and decreased urine volume.       + Nocturia, stable  Musculoskeletal: Positive for back pain, joint swelling, arthralgias (shoulders, hips, and knees  mainly) and neck stiffness.  Skin: Negative for color change and rash.  Neurological: Positive for numbness (chronic). Negative for dizziness, seizures, weakness and headaches.  Hematological: Negative for adenopathy. Does not bruise/bleed easily.  Psychiatric/Behavioral: Positive for sleep disturbance. Negative for behavioral problems and confusion. The patient is nervous/anxious.      Current Outpatient Prescriptions on File Prior to Visit  Medication Sig Dispense Refill  . aspirin 81 MG EC tablet Take 81 mg by mouth daily.     . Cyanocobalamin (VITAMIN B 12 PO) Take by mouth. Reported on 08/12/2015    . doxazosin (CARDURA) 2 MG tablet Take by mouth.    . finasteride (PROSCAR) 5 MG tablet Take 1 tablet (5 mg total) by mouth daily. 90 tablet 3  . furosemide (LASIX) 40 MG tablet TAKE 1 TABLET BY MOUTH DAILY 90 tablet 1  . gabapentin (NEURONTIN) 300 MG capsule Take 300 mg by mouth 3 (three) times daily. 300 mg during the day and 600 mg qhs    . glipiZIDE (GLUCOTROL XL) 5 MG 24 hr tablet     . Glucose Blood (FREESTYLE LITE TEST VI)     . metoprolol (LOPRESSOR) 100 MG tablet Take 100 mg by mouth 2 (two) times daily.    Marland Kitchen morphine (MS CONTIN) 15 MG 12 hr tablet Take 1 tablet (15 mg total) by mouth every 12 (twelve) hours. 20 tablet 0  . Multiple Vitamin (MULTIVITAMIN) tablet Take 1 tablet by mouth daily.    . ONE TOUCH ULTRA TEST test strip     . tamsulosin (FLOMAX) 0.4 MG CAPS capsule Take 0.4 mg by mouth daily after supper.    . timolol (TIMOPTIC-XR) 0.5 % ophthalmic gel-forming     . valsartan (DIOVAN) 320 MG tablet      No current facility-administered medications on file prior to visit.     Past Medical History  Diagnosis Date  . HERPES SIMPLEX INFECTION 01/19/2007  . TINEA PEDIS 11/15/2009  . DIABETES MELLITUS 01/19/2007  . HYPERLIPIDEMIA 01/19/2007  . DEPRESSION, CHRONIC 06/24/2007  . Carpal tunnel syndrome 03/24/2007  . HYPERTENSION 01/19/2007  . Intermediate coronary syndrome ([place])  08/29/2008  . CORONARY ARTERY DISEASE 01/19/2007  . URI 01/11/2009  . OSTEOARTHRITIS 06/24/2007  . SHOULDER PAIN, LEFT 03/24/2007  . SPINAL STENOSIS, LUMBAR 06/03/2009  . BACK PAIN, CHRONIC 04/18/2009  . PLANTAR FASCIITIS, RIGHT 01/19/2007  . LEG CRAMPS, NOCTURNAL 10/28/2007  . HEART MURMUR, SYSTOLIC 123XX123  . Diarrhea 02/23/2007  . BENIGN PROSTATIC HYPERTROPHY, HX OF 01/19/2007  . PERCUTANEOUS TRANSLUMINAL CORONARY ANGIOPLASTY, HX OF 01/19/2007  . Other postprocedural status(V45.89) 01/19/2007  . DIABETES MELLITUS, TYPE II, WITH NEUROLOGICAL COMPLICATIONS 99991111  . Leg cramps, sleep related 08/05/2010  . OBSTRUCTIVE SLEEP APNEA 01/19/2007    does not use CPAP    Social History   Social History  . Marital Status: Married    Spouse Name: N/A  . Number of Children:  N/A  . Years of Education: N/A   Occupational History  . 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 Smoking status: Former Smoker    Quit date: 04/14/1983  . Smokeless tobacco: Never Used     Comment: stopped 1985  . Alcohol Use: Yes     Comment: 2-3 times a week - wine, scotch  . Drug Use: No  . Sexual Activity: Not Asked   Other Topics Concern  . None   Social History Narrative   Married in 1959 - 89yrs, divorced; married 1974 - 1.5 years, divorced; married 1985 1 daughter 57; 96 step daughters; 2 grandchildren.    Filed Vitals:   08/12/15 0848  BP: 140/80  Pulse: 56  Temp: 97.6 F (36.4 C)  Resp: 12   Body mass index is 28.96 kg/(m^2).  SpO2 Readings from Last 3 Encounters:  08/12/15 95%  07/22/15 96%  06/21/13 96%       Objective:   Physical Exam  Constitutional: He is oriented to person, place, and time. He appears well-developed. No distress.  HENT:  Head: Atraumatic.  Mouth/Throat: Uvula is midline, oropharynx is clear and moist and mucous membranes are normal.  Eyes: Conjunctivae and EOM are normal. Pupils are equal, round, and reactive to light.    Neck: No JVD present.  Cardiovascular: Regular rhythm.  Bradycardia present.   Murmur (SEM I-II/VI RUSB and LUSB) heard.  Systolic murmur is present  Pulses:      Dorsalis pedis pulses are 2+ on the right side, and 2+ on the left side.  Varicose veins, some bilateral LE  Pulmonary/Chest: Effort normal and breath sounds normal. He has no wheezes. He has no rales.  Abdominal: Soft. There is no tenderness.  Musculoskeletal: He exhibits edema (LE bilateral pitting 2+). He exhibits no tenderness.       Right knee: He exhibits decreased range of motion.       Left knee: He exhibits decreased range of motion.  Shoulders normal ROM, no pain elicited, mildly decreased flexion wrists bilateral,some IP joints with nodules, Knee crepitus L>R. No signs of synovitis.  Lymphadenopathy:    He has no cervical adenopathy.       Right: No supraclavicular adenopathy present.       Left: No supraclavicular adenopathy present.  Neurological: He is alert and oriented to person, place, and time. Gait (assisted with cane) abnormal. Coordination normal.  Diabetic Foot Exam :  Decreased monofilament test bilateral, plantar      Skin: Skin is warm. No erythema.  Psychiatric: His speech is normal. His mood appears anxious. His affect is not labile. He expresses no suicidal ideation.  Good eye contact, well groomed.       Assessment & Plan:    Diagnoses and all orders for this visit:  Generalized osteoarthritis of multiple sites We discussed prognosis of this problem, expectations in regard to pain controlled, and side effects of medications. MS Contin to stop since it did not help. Percocet to continue but recommended taking it as prescribed tid. Some side effects discussed: fall risk,respiratory depression, and constipation among some. He is going to set up appt with pain management clinic.  -     oxyCODONE-acetaminophen (PERCOCET/ROXICET) 5-325 MG tablet; Take 1 tablet by mouth every 8 (eight) hours  as needed for severe pain.  Essential hypertension Adequately controlled according to current guidelines, I would prefer < 140/90, reporting better readings at home. No changes in current  management for now but will consider decreasing BB depending of BP/HR next OV, bradycardia is mild. DASH diet recommended. Eye exam recommended annually. F/U in 2 months, before if needed.  -     COMPLETE METABOLIC PANEL WITH GFR  Diabetes mellitus type 2 with neurological manifestations ([place]) HgA1C has bee at goal. No changes in current management, will adjust medication if needed, goal HgA1C < 8.0. Caution with hypoglycemia. Regular exercise and healthy diet with avoidance of added sugar food intake is an important part of treatment and recommended. Annual eye exam, periodic dental and foot care recommended. Recently dental exam, has a cavity and planning on treatment. Eye exam 07/19/15 no diabetic retinopathy, open angle glaucoma. F/U in 6 months   -     COMPLETE METABOLIC PANEL WITH GFR -     Hemoglobin A1c -     Microalbumin/Creatinine Ratio, Urine  Chronic obstructive pulmonary disease, unspecified COPD type ([facility]) Asymptomatic. Continue Albuterol inh as needed. Since he is planning on surgery, PFT's ordered.  -     Spirometry: Pre & Post Eval  Insomnia, unspecified  Seems to be aggravated by pain. Good sleep hygiene discussed. Trazodone may help, some side effects discussed. F/U in 1-2 months.  -     traZODone (DESYREL) 50 MG tablet; Take 0.5-1 tablets (25-50 mg total) by mouth at bedtime as needed for sleep.  Pure hypercholesterolemia  No changes in current management, non pharmacologic; will follow labs done today and will give further recommendations accordingly.  -     Lipid panel      -Patient advised to return or notify a doctor immediately if symptoms worsen or persist or new concerns arise.     [name]. [name], MD  [facility]. [place] office.

## 2015-08-12 NOTE — Patient Instructions (Addendum)
A few things to remember from today's visit:   1. Generalized osteoarthritis of multiple sites   2. Essential hypertension  - COMPLETE METABOLIC PANEL WITH GFR  3. Diabetes mellitus type 2 with neurological manifestations ([facility])  - COMPLETE METABOLIC PANEL WITH GFR - Hemoglobin A1c - Microalbumin/Creatinine Ratio, Urine  4. Chronic obstructive pulmonary disease, unspecified COPD type ([place])  - Spirometry: Pre & Post Eval  5. Insomnia, unspecified  - traZODone (DESYREL) 50 MG tablet; Take 0.5-1 tablets (25-50 mg total) by mouth at bedtime as needed for sleep.  Dispense: 30 tablet; Refill: 1  6. Pure hypercholesterolemia  - Lipid panel; Future   Fall precautions. Keep cardiology appt. Spirometry to be scheduled. Set up appt with pain management.    If you sign-up for My chart, you can communicate easier with [place] in case you have any question or concern.

## 2015-08-12 NOTE — Progress Notes (Signed)
Pre visit review using our clinic review tool, if applicable. No additional management support is needed unless otherwise documented below in the visit note. 

## 2015-08-13 ENCOUNTER — Telehealth: Payer: Self-pay | Admitting: General Practice

## 2015-08-13 ENCOUNTER — Encounter: Payer: Self-pay | Admitting: Family Medicine

## 2015-08-13 LAB — COMPLETE METABOLIC PANEL WITH GFR
ALK PHOS: 55 U/L (ref 40–115)
ALT: 19 U/L (ref 9–46)
AST: 18 U/L (ref 10–35)
Albumin: 4 g/dL (ref 3.6–5.1)
BUN: 22 mg/dL (ref 7–25)
CALCIUM: 9.2 mg/dL (ref 8.6–10.3)
CO2: 24 mmol/L (ref 20–31)
Chloride: 106 mmol/L (ref 98–110)
Creat: 0.61 mg/dL — ABNORMAL LOW (ref 0.70–1.11)
GFR, Est African American: >89 mL/min (ref >=60)
GFR, Est Non African American: >89 mL/min (ref >=60)
Glucose, Bld: 155 mg/dL — ABNORMAL HIGH (ref 65–99)
POTASSIUM: 4.5 mmol/L (ref 3.5–5.3)
Sodium: 140 mmol/L (ref 135–146)
Total Bilirubin: 0.7 mg/dL (ref 0.2–1.2)
Total Protein: 6.3 g/dL (ref 6.1–8.1)

## 2015-08-13 NOTE — Telephone Encounter (Signed)
Left message for patient to return call to office about labs.  Cholesterol elevated, I am recommending Simvastatin 20 mg daily. Fish oil (MegaRed) 2 caps daily.  Low fat diet.  We can repeat FLP in 4 months.

## 2015-08-15 ENCOUNTER — Encounter: Payer: Self-pay | Admitting: Family Medicine

## 2015-08-15 MED ORDER — ONETOUCH DELICA LANCETS 33G MISC: Status: DC

## 2015-08-29 ENCOUNTER — Ambulatory Visit (INDEPENDENT_AMBULATORY_CARE_PROVIDER_SITE_OTHER): Payer: PPO | Admitting: Cardiology

## 2015-08-29 ENCOUNTER — Encounter: Payer: Self-pay | Admitting: Cardiology

## 2015-08-29 VITALS — BP 128/78 | HR 50 | Ht 65.0 in | Wt 175.0 lb

## 2015-08-29 DIAGNOSIS — M791 Myalgia, unspecified site: Secondary | ICD-10-CM | POA: Insufficient documentation

## 2015-08-29 DIAGNOSIS — R001 Bradycardia, unspecified: Secondary | ICD-10-CM | POA: Diagnosis not present

## 2015-08-29 DIAGNOSIS — R252 Cramp and spasm: Secondary | ICD-10-CM

## 2015-08-29 DIAGNOSIS — I5033 Acute on chronic diastolic (congestive) heart failure: Secondary | ICD-10-CM

## 2015-08-29 DIAGNOSIS — Z889 Allergy status to unspecified drugs, medicaments and biological substances status: Secondary | ICD-10-CM

## 2015-08-29 DIAGNOSIS — Z789 Other specified health status: Secondary | ICD-10-CM

## 2015-08-29 DIAGNOSIS — R011 Cardiac murmur, unspecified: Secondary | ICD-10-CM | POA: Insufficient documentation

## 2015-08-29 DIAGNOSIS — Z01818 Encounter for other preprocedural examination: Secondary | ICD-10-CM | POA: Diagnosis not present

## 2015-08-29 DIAGNOSIS — I10 Essential (primary) hypertension: Secondary | ICD-10-CM | POA: Diagnosis not present

## 2015-08-29 DIAGNOSIS — T466X5A Adverse effect of antihyperlipidemic and antiarteriosclerotic drugs, initial encounter: Secondary | ICD-10-CM | POA: Insufficient documentation

## 2015-08-29 DIAGNOSIS — I2510 Atherosclerotic heart disease of native coronary artery without angina pectoris: Secondary | ICD-10-CM

## 2015-08-29 MED ORDER — METOPROLOL TARTRATE 50 MG PO TABS: 50.0000 mg | ORAL_TABLET | Freq: Two times a day (BID) | ORAL | Status: DC

## 2015-08-29 MED ORDER — METOPROLOL SUCCINATE ER 50 MG PO TB24: 50.0000 mg | ORAL_TABLET | Freq: Every day | ORAL | Status: DC

## 2015-08-29 MED ORDER — FUROSEMIDE 40 MG PO TABS: ORAL_TABLET | ORAL | Status: DC

## 2015-08-29 NOTE — Patient Instructions (Addendum)
Medication Instructions:   START TAKING TOPROL XL 50 MG ONCE DAILY  DR NELSON HAS CHANGED YOUR LASIX TO TAKING LASIX 40 MG ONCE DAILY.  YOU MAY TAKE AN EXTRA LASIX (40 MG) TABLET  IN THE EVENING, ONLY AS NEEDED FOR SOB, LOWER EXTREMITY EDEMA, AND WEIGHT GAIN GREATER THAN 3 LBS IN A 24 HOUR TIME PERIOD.    Testing/Procedures:  Your physician has requested that you have an echocardiogram. Echocardiography is a painless test that uses sound waves to create images of your heart. It provides your doctor with information about the size and shape of your heart and how well your heart's chambers and valves are working. This procedure takes approximately one hour. There are no restrictions for this procedure.    Follow-Up:  Your physician wants you to follow-up in: [place] will receive a reminder letter in the mail two months in advance. If you don't receive a letter, please call our office to schedule the follow-up appointment.        If you need a refill on your cardiac medications before your next appointment, please call your pharmacy.

## 2015-08-29 NOTE — Progress Notes (Signed)
Cardiology Office Note    Date:  08/29/2015   ID:  [name], DOB December 14, 1931, MRN [id number]  PCP:  Andrew [name], MD  Cardiologist:  Andrew Dawley, MD   Chief complain: preop evaluation prior to knee replacement, follow-up for CAD.  History of Present Illness:  [name] is a 82 y.o. male with prior medical history of diabetes, hypertension, untreated hyperlipidemia and CAD who underwent PCI to RCA in 2004 by Dr. Burt Knack, he then had non-STEMI in 2010 status post bare metal stent to RCA. He has seen Dr. Angelena Form is a follow-up in 2010 but hasn't followed ever since. He was started on rosuvastatin and atorvastatin, but because he heard about possible side effects he discontinued on his own. He also has chronic lower extremity cramping and he was worrying that that might get worse. He has been following with his primary care physician in the last 5 years that manages his chronic diastolic CHF as well as diabetes. He states that he has been doing well, however he is not very active as he is limited with his bilateral knee pains, he walks to the mailbox that is about a block and back without any chest pain or shortness of breath. His limiting factors is his knee pain. He denies any palpitations or syncope, no orthopnea paroxysmal nocturnal dyspnea.  He has noticed worsening lower extremity edema in the last couple of weeks around his ankles. No dizziness.  Past Medical History  Diagnosis Date  . HERPES SIMPLEX INFECTION 01/19/2007  . TINEA PEDIS 11/15/2009  . DIABETES MELLITUS 01/19/2007  . HYPERLIPIDEMIA 01/19/2007  . DEPRESSION, CHRONIC 06/24/2007  . Carpal tunnel syndrome 03/24/2007  . HYPERTENSION 01/19/2007  . Intermediate coronary syndrome ([place]) 08/29/2008  . CORONARY ARTERY DISEASE 01/19/2007  . URI 01/11/2009  . OSTEOARTHRITIS 06/24/2007  . SHOULDER PAIN, LEFT 03/24/2007  . SPINAL STENOSIS, LUMBAR 06/03/2009  . BACK PAIN, CHRONIC 04/18/2009  . PLANTAR FASCIITIS, RIGHT 01/19/2007  .  LEG CRAMPS, NOCTURNAL 10/28/2007  . HEART MURMUR, SYSTOLIC 123XX123  . Diarrhea 02/23/2007  . BENIGN PROSTATIC HYPERTROPHY, HX OF 01/19/2007  . PERCUTANEOUS TRANSLUMINAL CORONARY ANGIOPLASTY, HX OF 01/19/2007  . Other postprocedural status(V45.89) 01/19/2007  . DIABETES MELLITUS, TYPE II, WITH NEUROLOGICAL COMPLICATIONS 99991111  . Leg cramps, sleep related 08/05/2010  . OBSTRUCTIVE SLEEP APNEA 01/19/2007    does not use CPAP    Past Surgical History  Procedure Laterality Date  . Arthroscopy, knee of hx    . Plastic joint in thumb    . Inguinal herniorrhapy, hx    . Percutaneous transluminal coronary angioplasty, hx of  2004    Dr. Lyndel Safe  . Ptca/stent  2004  . Tonsillectomy    . Vasectomy    . Colonoscopy    . Cardiac catheterization    . Nasal sinus surgery      Current Medications: Outpatient Prescriptions Prior to Visit  Medication Sig Dispense Refill  . aspirin 81 MG EC tablet Take 81 mg by mouth daily.     . Cyanocobalamin (VITAMIN B 12 PO) Take by mouth. Reported on 08/12/2015    . doxazosin (CARDURA) 2 MG tablet Take by mouth.    . finasteride (PROSCAR) 5 MG tablet Take 1 tablet (5 mg total) by mouth daily. 90 tablet 3  . gabapentin (NEURONTIN) 300 MG capsule Take 300 mg by mouth 3 (three) times daily. 300 mg during the day and 600 mg qhs    . glipiZIDE (GLUCOTROL XL) 5 MG 24 hr  tablet     . Glucose Blood (FREESTYLE LITE TEST VI)     . morphine (MS CONTIN) 15 MG 12 hr tablet Take 1 tablet (15 mg total) by mouth every 12 (twelve) hours. 20 tablet 0  . Multiple Vitamin (MULTIVITAMIN) tablet Take 1 tablet by mouth daily.    . ONE TOUCH ULTRA TEST test strip     . ONETOUCH DELICA LANCETS 99991111 MISC USE TO CHECK BLOOD SUGAR DAILY AND PRN 100 each 4  . oxyCODONE-acetaminophen (PERCOCET/ROXICET) 5-325 MG tablet Take 1 tablet by mouth every 8 (eight) hours as needed for severe pain. 90 tablet 0  . tamsulosin (FLOMAX) 0.4 MG CAPS capsule Take 0.4 mg by mouth daily after supper.    .  timolol (TIMOPTIC-XR) 0.5 % ophthalmic gel-forming     . valsartan (DIOVAN) 320 MG tablet     . furosemide (LASIX) 40 MG tablet TAKE 1 TABLET BY MOUTH DAILY 90 tablet 1  . metoprolol (LOPRESSOR) 100 MG tablet Take 100 mg by mouth 2 (two) times daily.    . traZODone (DESYREL) 50 MG tablet Take 0.5-1 tablets (25-50 mg total) by mouth at bedtime as needed for sleep. (Patient not taking: Reported on 08/29/2015) 30 tablet 1   No facility-administered medications prior to visit.     Allergies:   Review of patient's allergies indicates no known allergies.   Social History   Social History  . Marital Status: Married    Spouse Name: N/A  . Number of Children: N/A  . Years of Education: N/A   Occupational History  . 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 Smoking status: Former Smoker    Quit date: 04/14/1983  . Smokeless tobacco: Never Used     Comment: stopped 1985  . Alcohol Use: Yes     Comment: 2-3 times a week - wine, scotch  . Drug Use: No  . Sexual Activity: Not Asked   Other Topics Concern  . None   Social History Narrative   Married in 1959 - 85yrs, divorced; married 1974 - 1.5 years, divorced; married 1985 1 daughter 15; 54 step daughters; 2 grandchildren.     Family History:  The patient's family history includes Coronary artery disease in his other; Diabetes in his other.   ROS:   Please see the history of present illness.    ROS All other systems reviewed and are negative.   PHYSICAL EXAM:   VS:  BP 128/78 mmHg  Pulse 50  Ht 5\' 5"  (1.651 m)  Wt 175 lb (79.379 kg)  BMI 29.12 kg/m2   GEN: Well nourished, well developed, in no acute distress HEENT: normal Neck: no JVD, carotid bruits, or masses Cardiac: RRR; 2/6 diastolicmurmurs, rubs, or gallops,1+ bilateral lower extremity edema up to half calves   Respiratory:  clear to auscultation bilaterally, normal work of breathing GI: soft, nontender, nondistended, + BS MS: no  deformity or atrophy Skin: warm and dry, no rash Neuro:  Alert and Oriented x 3, Strength and sensation are intact Psych: euthymic mood, full affect  Wt Readings from Last 3 Encounters:  08/29/15 175 lb (79.379 kg)  08/12/15 174 lb (78.926 kg)  07/22/15 179 lb 4 oz (81.307 kg)      Studies/Labs Reviewed:   EKG:  EKG is ordered today.  The ekg ordered today demonstrates sinus bradycardia with ventricular rate 50 otherwise normal EKG unchanged from prior.  Recent Labs: 08/12/2015: ALT 19; BUN 22;  Creat 0.61*; Potassium 4.5; Sodium 140   Lipid Panel    Component Value Date/Time   CHOL 197 08/12/2015 1014   TRIG * 08/12/2015 1014    411.0 Triglyceride is over 400; calculations on Lipids are invalid.   TRIG 162* 03/24/2006 0902   HDL 37.50* 08/12/2015 1014   CHOLHDL 5 08/12/2015 1014   CHOLHDL 3.4 CALC 03/24/2006 0902   VLDL 59.4* 09/28/2012 0851   LDLCALC 50 03/14/2012 1606   LDLDIRECT 92.0 08/12/2015 1014    Additional studies/ records that were reviewed today include:  TTE: 12/2013  - Left ventricle: The cavity size was normal. Wall thickness was increased in a pattern of mild LVH. Systolic function was normal. The estimated ejection fraction was in the range of 55% to 60%. Wall motion was normal; there were no regional wall motion abnormalities. Doppler parameters are consistent with abnormal left ventricular relaxation (grade 1 diastolic dysfunction). The E/e&' ratio is between 8-15, suggesting normal LV filling pressure. - Aortic valve: Structurally normal valve. Trileaflet. Mildly calcified with very mild stenosis. Transvalvular velocity was minimally increased There was trivial regurgitation. Valve area (VTI): 1.91 cm^2. - Mitral valve: Calcified annulus. There was trivial regurgitation. - Left atrium: Moderately dilated at 42 ml/m2.  Impressions:  - No significant changes compared to prior echo in 2009 except the LA is now moderately  dilated.    ASSESSMENT:    1. Acute on chronic diastolic congestive heart failure ([place])   2. Essential hypertension   3. Murmur   4. Sinus bradycardia   5. Pre-op evaluation   6. Coronary artery disease involving native coronary artery of native heart without angina pectoris   7. Statin intolerance   8. Cramp of both lower extremities      PLAN:  In order of problems listed above:  1. CAD - we will decreased dose of Toprol if patient states that he feels tired and he is bradycardic also his blood pressure is rather low for his age. We will continue valsartan, and aspirin. I had a long discussion regarding initiating statins, however he didn't tolerate in the past and recently as has experienced some memory problem and therefore would prefer not to take it.no ischemic workup necessary right now his EKG is the same as 2 years ago and normal. 2. Hypertension - blood pressure rather low I will cut down on metoprolol to Toprol-XL 50 mg daily. 3. Sinus bradycardia secondary to high dose of metoprolol decreased from 100-50 mg daily. 4. Hyperlipidemia - LDL 92 with goal less than 70 in a diabetic person, triglycerides 411, his wife states there is a huge Roman of improvement in his diet, he is advised to start to take statins however because of prior intolerance leg cramping, memory impairment he would prefer not to take it. He'll focus on his diet. 5. Murmur - diastolic, he has trivial AI however appears louder now we will order new echocardiogram. 6. Acute on chronic diastolic CHF, the patient is taking furosemide 40 mg daily.works most of the days, he's advised to take an extra Lasix 40 in the afternoon on days when he gains overnight 3 pounds or he notices worsening lower extremity edema or shortness of breath. 7. Leg cramping - advised to take over-the-counter magnesium 400 mg daily. 6. Preop evaluation - patient doesn't have symptoms of chest pain he can certainly achieve at least 4 METS,  and has symptoms of just mild fluid overload that can be treated with extra Lasix the Lasix 40 in  the afternoons. We will order an echocardiogram to evaluate for valvular abnormalities and if if no severe abnormality will refer for surgery.  Follow-up in one year.  Medication Adjustments/Labs and Tests Ordered: Current medicines are reviewed at length with the patient today.  Concerns regarding medicines are outlined above.  Medication changes, Labs and Tests ordered today are listed in the Patient Instructions below. Patient Instructions  Medication Instructions:   START TAKING TOPROL XL 50 MG ONCE DAILY  DR Michela Herst HAS CHANGED YOUR LASIX TO TAKING LASIX 40 MG ONCE DAILY.  YOU MAY TAKE AN EXTRA LASIX (40 MG) TABLET  IN THE EVENING, ONLY AS NEEDED FOR SOB, LOWER EXTREMITY EDEMA, AND WEIGHT GAIN GREATER THAN 3 LBS IN A 24 HOUR TIME PERIOD.    Testing/Procedures:  Your physician has requested that you have an echocardiogram. Echocardiography is a painless test that uses sound waves to create images of your heart. It provides your doctor with information about the size and shape of your heart and how well your heart's chambers and valves are working. This procedure takes approximately one hour. There are no restrictions for this procedure.    Follow-Up:  Your physician wants you to follow-up in: [place] will receive a reminder letter in the mail two months in advance. If you don't receive a letter, please call our office to schedule the follow-up appointment.        If you need a refill on your cardiac medications before your next appointment, please call your pharmacy.       Signed, Andrew Dawley, MD  08/29/2015 10:44 AM    [facility], [place], [place]  [address] Phone: [phone]; Fax: [phone]

## 2015-09-02 ENCOUNTER — Other Ambulatory Visit: Payer: Self-pay

## 2015-09-02 DIAGNOSIS — M792 Neuralgia and neuritis, unspecified: Secondary | ICD-10-CM

## 2015-09-02 MED ORDER — GABAPENTIN 300 MG PO CAPS: ORAL_CAPSULE | ORAL | Status: DC

## 2015-09-02 NOTE — Telephone Encounter (Signed)
Pt is calling to request a refill on GABAPENTIN 300MG  #[id number]. Last filled on 05/08/2015, last OV 08/12/15. Okay to refill?

## 2015-09-04 ENCOUNTER — Ambulatory Visit (HOSPITAL_BASED_OUTPATIENT_CLINIC_OR_DEPARTMENT_OTHER): Payer: PPO

## 2015-09-04 ENCOUNTER — Ambulatory Visit (HOSPITAL_BASED_OUTPATIENT_CLINIC_OR_DEPARTMENT_OTHER)
Admission: RE | Admit: 2015-09-04 | Discharge: 2015-09-04 | Disposition: A | Discharge status: Home / Self Care | Payer: PPO | Source: Ambulatory Visit | Attending: Cardiology | Admitting: Cardiology

## 2015-09-04 DIAGNOSIS — R011 Cardiac murmur, unspecified: Secondary | ICD-10-CM | POA: Diagnosis not present

## 2015-09-04 NOTE — Progress Notes (Signed)
Echocardiogram 2D Echocardiogram has been performed.  Joelene Millin 09/04/2015, 11:28 AM

## 2015-09-18 ENCOUNTER — Encounter: Payer: Self-pay | Admitting: Family Medicine

## 2015-09-18 ENCOUNTER — Ambulatory Visit (INDEPENDENT_AMBULATORY_CARE_PROVIDER_SITE_OTHER): Payer: PPO | Admitting: Family Medicine

## 2015-09-18 VITALS — BP 134/74 | HR 61 | Temp 97.6°F | Resp 12 | Ht 65.0 in | Wt 177.7 lb

## 2015-09-18 DIAGNOSIS — M159 Polyosteoarthritis, unspecified: Secondary | ICD-10-CM

## 2015-09-18 DIAGNOSIS — M1712 Unilateral primary osteoarthritis, left knee: Secondary | ICD-10-CM

## 2015-09-18 DIAGNOSIS — J309 Allergic rhinitis, unspecified: Secondary | ICD-10-CM

## 2015-09-18 DIAGNOSIS — K529 Noninfective gastroenteritis and colitis, unspecified: Secondary | ICD-10-CM

## 2015-09-18 DIAGNOSIS — R001 Bradycardia, unspecified: Secondary | ICD-10-CM

## 2015-09-18 DIAGNOSIS — R5382 Chronic fatigue, unspecified: Secondary | ICD-10-CM

## 2015-09-18 DIAGNOSIS — G4700 Insomnia, unspecified: Secondary | ICD-10-CM | POA: Diagnosis not present

## 2015-09-18 DIAGNOSIS — I10 Essential (primary) hypertension: Secondary | ICD-10-CM | POA: Diagnosis not present

## 2015-09-18 DIAGNOSIS — M179 Osteoarthritis of knee, unspecified: Secondary | ICD-10-CM

## 2015-09-18 MED ORDER — METOPROLOL SUCCINATE ER 50 MG PO TB24: 25.0000 mg | ORAL_TABLET | Freq: Every day | ORAL | Status: DC

## 2015-09-18 MED ORDER — TRAMADOL HCL 50 MG PO TABS: 50.0000 mg | ORAL_TABLET | Freq: Three times a day (TID) | ORAL | Status: DC | PRN

## 2015-09-18 MED ORDER — DICLOFENAC SODIUM 1 % TD GEL: 4.0000 g | Freq: Four times a day (QID) | TRANSDERMAL | Status: DC

## 2015-09-18 NOTE — Progress Notes (Signed)
[name] is a 82 y.o.male, who is here today to follow on some of his chronic medical problems. He has some concerns today, neither one is new: fatigue, LE edema, diarrhea, knee pain among some.  Currently he is on Metoprolol Succinate 50 mg, Valsartan 320 mg, and Cardura 2 mg daily. He has hx of diastolic dysfunction, recently he saw Dr [name], cardiologists. He also had a echo done, according to pt he is already cleared for TKR. His Metoprolol was decreased from 100 mg to 50 mg because bradycardia and fatigue. Also Furosemide was increased to 40 mg, he does not take it daily because urine frequency, so skips med when he needs to go out or for appts. Denies orthopnea or PND. LE edema, worse at the end of the day, better in the morning, stable.  BP readings brought today, some < 100/60 (98/58 and 85/44) and HR low 60's.   He is taking medications as instructed, no side effects reported.  He has not noted unusual headache, visual changes, exertional chest pain, dyspnea,  focal weakness, or worsening edema.    Lab Results  Component Value Date   CREATININE 0.61* 08/12/2015   BUN 22 08/12/2015   NA 140 08/12/2015   K 4.5 08/12/2015   CL 106 08/12/2015   CO2 24 08/12/2015   He reports not changes in fatigue, which is chronic.Hx of OSA, has refused wearing CPAP.   Diarrhea: He has had a long history of diarrhea, has had it "forever",he states that he remembers having it in his 3s. Metformin was discontinued a few months ago, that he didn't notice any improvement. He seems like problem is worse for the past 2-3 weeks, 2-3 stools daily,he has not noted blood in the stool. Over-the-counter Imodium seems to help. In the past he tried Metamucil.  Abdominal cramps, intermittently, improved after defecation, also having it for years; sometimes pain is "bad" but always improves with bowel movements. No dysuria or gross hematuria.  Reports having colonoscopy a few years ago, no  f/u recommended. Hx of diverticulosis.  Chronic pain: He has history of generalized osteoarthritis: mainly knees, shoulders, and hips. He is currently following with orthopedist and planning on having total knee replacement. According to patient he has been cleared already but he doesn't have a dated yet. Percocet helped legal and for about 30 minutes to an hour, MS Contin didn't seem to help at all, he also has tried hydrocodone in the past and didn't help. He remembers taking tramadol, not sure about helping with pain. Pain is sometimes "bad", limit his daily activities as walking, now he has a cane.  Insomnia: Last office visit I recommended trazodone 50 mg, which he has taken it a few times, he is not very specific about this medication helping, he states that sometimes he does but he wakes Up due to pain. He drinks 2 shots of whiskey and this seems to help better with sleep and pain. He denies any history of alcohol abuse, no side effects from trazodone.  He is also c/o nasal congestion and rhinorrhea, which he has had for years. Wonders if he can take something. No sick contact, sore throat, or fever/chills. Hx of allergies. Using nasal strips, which help some.   Review of Systems  Constitutional: Positive for fatigue (no more than usual). Negative for fever, appetite change and unexpected weight change.  HENT: Positive for congestion and rhinorrhea. Negative for mouth sores, nosebleeds, sore throat and trouble swallowing.  Eyes: Negative for photophobia and visual disturbance.  Respiratory: Negative for cough, shortness of breath and wheezing.        Hx of OSA.   Cardiovascular: Positive for leg swelling (at baseline). Negative for chest pain and palpitations.       No orthopnea or PND.  Gastrointestinal: Positive for abdominal pain and diarrhea. Negative for nausea, vomiting, blood in stool and abdominal distention.  Genitourinary: Positive for frequency (hx of BPH). Negative for  dysuria, hematuria and decreased urine volume.       + Nocturia, stable  Musculoskeletal: Positive for arthralgias (shoulders, hips, and knees mainly) and gait problem.  Skin: Negative for rash.  Neurological: Positive for numbness (chronic). Negative for dizziness, seizures, syncope, weakness and headaches.  Psychiatric/Behavioral: Positive for sleep disturbance. Negative for suicidal ideas, behavioral problems and confusion. The patient is not nervous/anxious.      Current Outpatient Prescriptions on File Prior to Visit  Medication Sig Dispense Refill  . aspirin 81 MG EC tablet Take 81 mg by mouth daily.     . Cyanocobalamin (VITAMIN B 12 PO) Take by mouth. Reported on 08/12/2015    . doxazosin (CARDURA) 2 MG tablet Take by mouth.    . finasteride (PROSCAR) 5 MG tablet Take 1 tablet (5 mg total) by mouth daily. 90 tablet 3  . furosemide (LASIX) 40 MG tablet Take 1 tab (40 mg total) by mouth daily. You may take 1 extra lasix in the evening if you have SOB, LEE, and weight gain > 3lbs in 24 hours 180 tablet 11  . gabapentin (NEURONTIN) 300 MG capsule 300 mg during the day and 600 mg qhs 270 capsule 1  . glipiZIDE (GLUCOTROL XL) 5 MG 24 hr tablet     . Glucose Blood (FREESTYLE LITE TEST VI)     . Multiple Vitamin (MULTIVITAMIN) tablet Take 1 tablet by mouth daily.    . ONE TOUCH ULTRA TEST test strip     . ONETOUCH DELICA LANCETS 99991111 MISC USE TO CHECK BLOOD SUGAR DAILY AND PRN 100 each 4  . tamsulosin (FLOMAX) 0.4 MG CAPS capsule Take 0.4 mg by mouth daily after supper.    . timolol (TIMOPTIC-XR) 0.5 % ophthalmic gel-forming     . valsartan (DIOVAN) 320 MG tablet      No current facility-administered medications on file prior to visit.     Past Medical History  Diagnosis Date  . HERPES SIMPLEX INFECTION 01/19/2007  . TINEA PEDIS 11/15/2009  . DIABETES MELLITUS 01/19/2007  . HYPERLIPIDEMIA 01/19/2007  . DEPRESSION, CHRONIC 06/24/2007  . Carpal tunnel syndrome 03/24/2007  . HYPERTENSION  01/19/2007  . Intermediate coronary syndrome ([place]) 08/29/2008  . CORONARY ARTERY DISEASE 01/19/2007  . URI 01/11/2009  . OSTEOARTHRITIS 06/24/2007  . SHOULDER PAIN, LEFT 03/24/2007  . SPINAL STENOSIS, LUMBAR 06/03/2009  . BACK PAIN, CHRONIC 04/18/2009  . PLANTAR FASCIITIS, RIGHT 01/19/2007  . LEG CRAMPS, NOCTURNAL 10/28/2007  . HEART MURMUR, SYSTOLIC 123XX123  . Diarrhea 02/23/2007  . BENIGN PROSTATIC HYPERTROPHY, HX OF 01/19/2007  . PERCUTANEOUS TRANSLUMINAL CORONARY ANGIOPLASTY, HX OF 01/19/2007  . Other postprocedural status(V45.89) 01/19/2007  . DIABETES MELLITUS, TYPE II, WITH NEUROLOGICAL COMPLICATIONS 99991111  . Leg cramps, sleep related 08/05/2010  . OBSTRUCTIVE SLEEP APNEA 01/19/2007    does not use CPAP    No Known Allergies  Social History   Social History  . Marital Status: Married    Spouse Name: N/A  . Number of Children: N/A  . Years of Education: N/A  Occupational History  . 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 Smoking status: Former Smoker    Quit date: 04/14/1983  . Smokeless tobacco: Never Used     Comment: stopped 1985  . Alcohol Use: Yes     Comment: 2-3 times a week - wine, scotch  . Drug Use: No  . Sexual Activity: Not Asked   Other Topics Concern  . None   Social History Narrative   Married in 1959 - 82yrs, divorced; married 1974 - 1.5 years, divorced; married 1985 1 daughter 28; 57 step daughters; 2 grandchildren.    Filed Vitals:   09/18/15 1030  BP: 134/74  Pulse: 61  Temp: 97.6 F (36.4 C)  Resp: 12   Body mass index is 29.57 kg/(m^2).  SpO2 Readings from Last 3 Encounters:  09/18/15 95%  08/12/15 95%  07/22/15 96%     Physical Exam  Constitutional: He is oriented to person, place, and time. He appears well-developed. No distress.  HENT:  Head: Atraumatic.  Eyes: Conjunctivae and EOM are normal. Pupils are equal, round, and reactive to light.  Cardiovascular: Regular rhythm.   Bradycardia present.   Murmur heard. HR 58/min by my count.  Respiratory: Effort normal and breath sounds normal. No respiratory distress.  GI: Soft. Bowel sounds are normal. He exhibits no mass. There is no tenderness.  Musculoskeletal: He exhibits edema (2+ pitting edema LE bilateral.). He exhibits no tenderness.  Lymphadenopathy:    He has no cervical adenopathy.  Neurological: He is alert and oriented to person, place, and time. He has normal strength.  Stable gait, using a cane.  Skin: Skin is warm. No erythema.  Psychiatric: His speech is normal. His mood appears anxious.  good eye contact.    ASSESSMENT AND PLAN:   Denzel was seen today for follow-up.  Diagnoses and all orders for this visit:  Generalized osteoarthritis of multiple sites   He agrees with trying tramadol, some side effects discussed.We discussed expectation of pain management. He didn't tolerate Cymbalta. He can also try acetaminophen over-the-counter 3 times per day. Caution with alcohol intake. Fall precautions also discussed.  Follow-up in 4 weeks.  -     traMADol (ULTRAM) 50 MG tablet; Take 1 tablet (50 mg total) by mouth every 8 (eight) hours as needed for moderate pain.  Essential hypertension  BP rechecked 140/70, HR 58. Because he is still having some low blood pressures at home, metoprolol was decreased from 50 mg to 25 mg. No changes in the rest of his medications, continue monitoring blood pressure and pulse at home. Follow-up in 4 weeks.  -     metoprolol succinate (TOPROL-XL) 50 MG 24 hr tablet; Take 1 tablet (50 mg total) by mouth daily. Take with or immediately following a meal.  Insomnia, unspecified  Seems like it is aggravated by pain, trazodone was discontinued since he is not taking it daily and because Tramadol was added today. Good sleep hygiene, decrease alcohol intake. Tramadol was started for pain management therefore it might help with improving sleep. Will consider  Doxepin. Follow-up in 4 weeks.   Chronic diarrhea ? IBD-D. This is chronic, we discussed possible causes and further workup. He refused to consider a colonoscopy. For now he will try over-the-counter Metamucil and low residual diet. Follow-up in 4 weeks.  Chronic fatigue  We discussed possible causes including chronic illness and medications. Now no further recommendations other than trying to better control pain  and improve sleep.  Sinus bradycardia  Improved. Metoprolol decreased from 50 to25 mg. He will continue monitoring pulse at home.  Allergic rhinitis, unspecified allergic rhinitis type  Because his history of glaucoma I don't recommend steroid nasal spray. Nasal saline drops and continue nasal strips for now. I do not recommend taking over-the-counter decongestants or cold medications due to side effects.  Osteoarthritis of left knee, unspecified osteoarthritis type  Pending total knee replacement. He will try topical Voltaren, tramadol, and acetaminophen. Continue following with orthopedist.  -     traMADol (ULTRAM) 50 MG tablet; Take 1 tablet (50 mg total) by mouth every 8 (eight) hours as needed for moderate pain. -     diclofenac sodium (VOLTAREN) 1 % GEL; Apply 4 g topically 4 (four) times daily.      -Patient advised to return sooner than planned today if new concerns arise.     Roniesha Hollingshead G. [name], MD  [facility]. [place] office.

## 2015-09-18 NOTE — Progress Notes (Signed)
Pre visit review using our clinic review tool, if applicable. No additional management support is needed unless otherwise documented below in the visit note. 

## 2015-09-18 NOTE — Patient Instructions (Addendum)
A few things to remember from today's visit:   1. Essential hypertension   2. Insomnia, unspecified   3. Chronic diarrhea  Over the counter Metamucil. Low residual diet. Decreased Metoprolol from 50 mg to 25 mg, continue monitoring blood pressure and pulse.  Tramadol, Tylenol 650 mg 3 times daily, Voltaren get.  BP goal < 150/90      If you sign-up for My chart, you can communicate easier with [place] in case you have any question or concern.

## 2015-09-23 ENCOUNTER — Ambulatory Visit: Payer: Self-pay | Admitting: Family Medicine

## 2015-09-24 ENCOUNTER — Encounter: Payer: Self-pay | Admitting: Family Medicine

## 2015-09-25 ENCOUNTER — Other Ambulatory Visit: Payer: Self-pay | Admitting: Family Medicine

## 2015-09-25 MED ORDER — VALSARTAN 320 MG PO TABS: 320.0000 mg | ORAL_TABLET | Freq: Every day | ORAL | Status: DC

## 2015-10-02 ENCOUNTER — Encounter: Payer: Self-pay | Admitting: Family Medicine

## 2015-10-03 MED ORDER — GLIPIZIDE ER 5 MG PO TB24: 5.0000 mg | ORAL_TABLET | Freq: Every day | ORAL | Status: DC

## 2015-10-03 MED ORDER — TAMSULOSIN HCL 0.4 MG PO CAPS: 0.4000 mg | ORAL_CAPSULE | Freq: Every day | ORAL | Status: DC

## 2015-10-03 NOTE — Telephone Encounter (Signed)
Refills sent to pharmacy. 

## 2015-10-16 ENCOUNTER — Encounter: Payer: Self-pay | Admitting: Family Medicine

## 2015-10-16 ENCOUNTER — Ambulatory Visit (INDEPENDENT_AMBULATORY_CARE_PROVIDER_SITE_OTHER): Payer: PPO | Admitting: Family Medicine

## 2015-10-16 VITALS — BP 138/84 | HR 60 | Temp 98.0°F | Resp 12 | Ht 65.0 in | Wt 178.0 lb

## 2015-10-16 DIAGNOSIS — K529 Noninfective gastroenteritis and colitis, unspecified: Secondary | ICD-10-CM | POA: Insufficient documentation

## 2015-10-16 DIAGNOSIS — I10 Essential (primary) hypertension: Secondary | ICD-10-CM | POA: Diagnosis not present

## 2015-10-16 DIAGNOSIS — Z5181 Encounter for therapeutic drug level monitoring: Secondary | ICD-10-CM

## 2015-10-16 DIAGNOSIS — M159 Polyosteoarthritis, unspecified: Secondary | ICD-10-CM | POA: Diagnosis not present

## 2015-10-16 LAB — BASIC METABOLIC PANEL
BUN: 17 mg/dL (ref 6–23)
CALCIUM: 9.3 mg/dL (ref 8.4–10.5)
CO2: 28 mEq/L (ref 19–32)
Chloride: 103 mEq/L (ref 96–112)
Creatinine, Ser: 0.8 mg/dL (ref 0.40–1.50)
GFR: 98.45 mL/min (ref >60.00)
GLUCOSE: 194 mg/dL — AB (ref 70–99)
Potassium: 4.2 mEq/L (ref 3.5–5.1)
SODIUM: 141 meq/L (ref 135–145)

## 2015-10-16 LAB — MAGNESIUM: MAGNESIUM: 2.2 mg/dL (ref 1.5–2.5)

## 2015-10-16 LAB — TSH: TSH: 1.6 u[IU]/mL (ref 0.35–4.50)

## 2015-10-16 MED ORDER — DIPHENOXYLATE-ATROPINE 2.5-0.025 MG PO TABS: 1.0000 | ORAL_TABLET | Freq: Three times a day (TID) | ORAL | Status: DC | PRN

## 2015-10-16 MED ORDER — METOPROLOL SUCCINATE ER 25 MG PO TB24: 25.0000 mg | ORAL_TABLET | Freq: Every day | ORAL | Status: DC

## 2015-10-16 NOTE — Progress Notes (Signed)
HPI:   [name] is a 82 y.o. male, who is here today with his wife to follow on recent office visit.   He was seen on 09/18/15, when antihypertensive meds were re-adjusted and new medication for pain added.  Overall symptoms are stable, no new symptoms reported.    Hypertension: Last office visit his Metoprolol succinate was decreased from 50 mg total 25 mg. He is also on Diovan 320 mg daily and Cardura 2 mg at bedtime. He is tolerating medication well. + CHF, taking furosemide 40 mg tablet daily and an extra tablet if weight gain.  Echo : Left ventricle: The cavity size was normal. Systolic function was normal. Wall motion was normal; there were no regional wall  motion abnormalities. Doppler parameters are consistent with abnormal left ventricular relaxation (grade 1 diastolic  dysfunction).  - Aortic valve: There was mild stenosis. There was mild regurgitation. Valve area (VTI): 1.54 cm^2. Valve area (Vmax):  1.64 cm^2. Valve area (Vmean): 1.45 cm^2. - Mitral valve: Calcified annulus. Transvalvular velocity was within the normal range.  - Left atrium: The atrium was moderately to severely dilated. - Right ventricle: The cavity size was normal. Wall thickness was normal. Systolic function was normal. - Tricuspid valve: There was trivial regurgitation. - Pulmonary arteries: Systolic pressure was within the normal range. PA peak pressure: 25 mm Hg (S).  For the past 2 months he is been taking over-the-counter Magnesium 400 mg daily.  He has long history of generalized muscle cramps, Gabapentin has helped, he has noted more frequent episodes for the past 1-2 weeks. Furosemide dose was increased about 2 months ago, he has been more compliant with medication. Hx of BPH, urinary frequency and urgency stable. He denies gross hematuria.  Diarrhea:  He has had loose stools for years, didn't improve much with stopping Metformin. Usually his 2 first stools early in the  morning are formed, followed by softer and loose stools. Over-the-counter Imodium helps. He denies frequent or severe abdominal pain, occasionally he may have some cramps right before defecation and better after; no blood in the stool or melena. She denies any unusual weight loss. Last office visit some dietary recommendations were given, low residual diet, he did not follow them.   Lab Results  Component Value Date   TSH 0.719 10/13/2012   Lab Results  Component Value Date   CREATININE 0.61* 08/12/2015   BUN 22 08/12/2015   NA 140 08/12/2015   K 4.5 08/12/2015   CL 106 08/12/2015   CO2 24 08/12/2015   Chronic pain history of generalized osteoarthritis ([place] knees [place] shoulders) and back pain, he postponed total knee replacement surgery, he has an appointment with [facility] tomorrow, he is hoping to discuss other treatment options available..  Last office visit, about 4 weeks ago, Tramadol was added, he took it twice, discontinued because didn't help at all. In the past he has tried hydrocodone, Percocet, MS Contin but not major improvement with needed at this medications.   Review of Systems  Constitutional: Positive for fatigue (no more than usual). Negative for fever, diaphoresis, activity change, appetite change and unexpected weight change.  HENT: Negative for mouth sores, nosebleeds, sore throat and trouble swallowing.   Eyes: Negative for pain and visual disturbance.  Respiratory: Negative for cough, shortness of breath and wheezing.           Cardiovascular: Positive for leg swelling. Negative for chest pain and palpitations.  No orthopnea or PND.  Gastrointestinal: Positive for abdominal pain (occasional cramps) and diarrhea. Negative for nausea, vomiting, blood in stool and abdominal distention.  Endocrine: Negative for cold intolerance and heat intolerance.  Genitourinary: Negative for dysuria, hematuria, flank pain and decreased urine volume.       +  Nocturia, stable  Musculoskeletal: Positive for arthralgias and gait problem.  Skin: Negative for color change and rash.  Neurological: Negative for seizures, syncope, weakness and headaches.  Psychiatric/Behavioral: Negative for confusion. The patient is nervous/anxious.       Current Outpatient Prescriptions on File Prior to Visit  Medication Sig Dispense Refill  . aspirin 81 MG EC tablet Take 81 mg by mouth daily.     . Cyanocobalamin (VITAMIN B 12 PO) Take by mouth. Reported on 08/12/2015    . diclofenac sodium (VOLTAREN) 1 % GEL Apply 4 g topically 4 (four) times daily. 400 g 2  . doxazosin (CARDURA) 2 MG tablet Take by mouth.    . finasteride (PROSCAR) 5 MG tablet Take 1 tablet (5 mg total) by mouth daily. 90 tablet 3  . furosemide (LASIX) 40 MG tablet Take 1 tab (40 mg total) by mouth daily. You may take 1 extra lasix in the evening if you have SOB, LEE, and weight gain > 3lbs in 24 hours 180 tablet 11  . gabapentin (NEURONTIN) 300 MG capsule 300 mg during the day and 600 mg qhs 270 capsule 1  . glipiZIDE (GLUCOTROL XL) 5 MG 24 hr tablet Take 1 tablet (5 mg total) by mouth daily with breakfast. 90 tablet 1  . Glucose Blood (FREESTYLE LITE TEST VI)     . ONE TOUCH ULTRA TEST test strip     . ONETOUCH DELICA LANCETS 99991111 MISC USE TO CHECK BLOOD SUGAR DAILY AND PRN 100 each 4  . tamsulosin (FLOMAX) 0.4 MG CAPS capsule Take 1 capsule (0.4 mg total) by mouth daily after supper. 90 capsule 1  . timolol (TIMOPTIC-XR) 0.5 % ophthalmic gel-forming     . valsartan (DIOVAN) 320 MG tablet Take 1 tablet (320 mg total) by mouth daily. 90 tablet 3   No current facility-administered medications on file prior to visit.     Past Medical History  Diagnosis Date  . HERPES SIMPLEX INFECTION 01/19/2007  . TINEA PEDIS 11/15/2009  . DIABETES MELLITUS 01/19/2007  . HYPERLIPIDEMIA 01/19/2007  . DEPRESSION, CHRONIC 06/24/2007  . Carpal tunnel syndrome 03/24/2007  . HYPERTENSION 01/19/2007  . Intermediate  coronary syndrome ([place]) 08/29/2008  . CORONARY ARTERY DISEASE 01/19/2007  . URI 01/11/2009  . OSTEOARTHRITIS 06/24/2007  . SHOULDER PAIN, LEFT 03/24/2007  . SPINAL STENOSIS, LUMBAR 06/03/2009  . BACK PAIN, CHRONIC 04/18/2009  . PLANTAR FASCIITIS, RIGHT 01/19/2007  . LEG CRAMPS, NOCTURNAL 10/28/2007  . HEART MURMUR, SYSTOLIC 123XX123  . Diarrhea 02/23/2007  . BENIGN PROSTATIC HYPERTROPHY, HX OF 01/19/2007  . PERCUTANEOUS TRANSLUMINAL CORONARY ANGIOPLASTY, HX OF 01/19/2007  . Other postprocedural status(V45.89) 01/19/2007  . DIABETES MELLITUS, TYPE II, WITH NEUROLOGICAL COMPLICATIONS 99991111  . Leg cramps, sleep related 08/05/2010  . OBSTRUCTIVE SLEEP APNEA 01/19/2007    does not use CPAP   No Known Allergies  Social History   Social History  . Marital Status: Married    Spouse Name: N/A  . Number of Children: N/A  . Years of Education: N/A   Occupational History  . truck Educational psychologist for [employer]  . Smoking status: Former Smoker  Quit date: 04/14/1983  . Smokeless tobacco: Never Used     Comment: stopped 1985  . Alcohol Use: Yes     Comment: 2-3 times a week - wine, scotch  . Drug Use: No  . Sexual Activity: Not Asked   Other Topics Concern  . None   Social History Narrative   Married in 1959 - 36yrs, divorced; married 1974 - 1.5 years, divorced; married 1985 1 daughter 11; 83 step daughters; 2 grandchildren.    Filed Vitals:   10/16/15 1101  BP: 138/84  Pulse: 60  Temp: 98 F (36.7 C)  Resp: 12   Body mass index is 29.62 kg/(m^2).  SpO2 Readings from Last 3 Encounters:  10/16/15 97%  09/18/15 95%  08/12/15 95%       Physical Exam  Constitutional: He is oriented to person, place, and time. He appears well-developed. No distress.  HENT:  Head: Atraumatic.  Mouth/Throat: Oropharynx is clear and moist and mucous membranes are normal.  Eyes: Conjunctivae and EOM are normal. Pupils are equal, round, and reactive to  light.  Neck: No JVD present. No tracheal deviation present. No thyroid mass present.  Cardiovascular: Normal rate and regular rhythm.  Exam reveals no gallop.   Murmur (SEM II/VI base) heard. Respiratory: Effort normal and breath sounds normal. No respiratory distress.  GI: Soft. Bowel sounds are normal. He exhibits no mass. There is no tenderness.  Musculoskeletal: He exhibits edema (2+ pitting edema LE bilateral.). He exhibits no tenderness.  Lymphadenopathy:    He has no cervical adenopathy.  Neurological: He is alert and oriented to person, place, and time. He has normal strength. Coordination normal.  Stable gait, no assistance today.  Skin: Skin is warm. No erythema.  Psychiatric: He has a normal mood and affect. His speech is normal.  Well groomed,good eye contact.      ASSESSMENT AND PLAN:   Lab Results  Component Value Date   TSH 1.60 10/16/2015   Lab Results  Component Value Date   CREATININE 0.80 10/16/2015   BUN 17 10/16/2015   NA 141 10/16/2015   K 4.2 10/16/2015   CL 103 10/16/2015   CO2 28 10/16/2015     Damiean was seen today for follow-up.  Diagnoses and all orders for this visit:  Generalized osteoarthritis of multiple sites  I have prescribe opioid medications in the past but he has not noted major improvement. Explained that OA is chronic, he needs to have feasible expectations in regard to pain controlled. Referral to pain management placed.  -     Ambulatory referral to Pain Clinic  Chronic diarrhea  IBS-D. He will try Lomotil instead Imodium. Some side effects discussed. Low residual diet. F/U in 6 weeks.  -     diphenoxylate-atropine (LOMOTIL) 2.5-0.025 MG tablet; Take 1 tablet by mouth 3 (three) times daily as needed for diarrhea or loose stools. -     Magnesium -     TSH  Essential hypertension  Adequately controlled. No changes in current management. DASH diet recommended. Eye exam recommended annually. F/U in 6 months,  before if needed.  -     Basic metabolic panel -     metoprolol succinate (TOPROL-XL) 25 MG 24 hr tablet; Take 1 tablet (25 mg total) by mouth daily.  Encounter for therapeutic drug monitoring  No changes in Mg dose, further recommendations will be given according to lab results.  -     Magnesium        Inez Catalina  G. [name], MD  [facility]. [place] office.

## 2015-10-16 NOTE — Progress Notes (Signed)
Pre visit review using our clinic review tool, if applicable. No additional management support is needed unless otherwise documented below in the visit note. 

## 2015-10-16 NOTE — Patient Instructions (Signed)
A few things to remember from today's visit:   1. Essential hypertension  - Basic metabolic panel  2. Generalized osteoarthritis of multiple sites  - Ambulatory referral to Pain Clinic  3. Chronic diarrhea  - diphenoxylate-atropine (LOMOTIL) 2.5-0.025 MG tablet; Take 1 tablet by mouth 3 (three) times daily as needed for diarrhea or loose stools.  Dispense: 45 tablet; Refill: 1    Referral for pain clinic was placed. No changes in blood pressure medications. Fall precautions, I would prefer for you to use a walker. Try Lomotil and low residual diet for diarrhea.  If you sign-up for My chart, you can communicate easier with [place] in case you have any question or concern.

## 2015-11-05 ENCOUNTER — Ambulatory Visit (INDEPENDENT_AMBULATORY_CARE_PROVIDER_SITE_OTHER): Payer: PPO | Admitting: Family Medicine

## 2015-11-05 ENCOUNTER — Encounter: Payer: Self-pay | Admitting: Family Medicine

## 2015-11-05 VITALS — BP 142/72 | HR 69 | Temp 98.2°F | Ht 65.0 in | Wt 174.0 lb

## 2015-11-05 DIAGNOSIS — G4700 Insomnia, unspecified: Secondary | ICD-10-CM

## 2015-11-05 DIAGNOSIS — M159 Polyosteoarthritis, unspecified: Secondary | ICD-10-CM

## 2015-11-05 DIAGNOSIS — K529 Noninfective gastroenteritis and colitis, unspecified: Secondary | ICD-10-CM

## 2015-11-05 DIAGNOSIS — J441 Chronic obstructive pulmonary disease with (acute) exacerbation: Secondary | ICD-10-CM | POA: Diagnosis not present

## 2015-11-05 MED ORDER — TRAZODONE HCL 50 MG PO TABS: 25.0000 mg | ORAL_TABLET | Freq: Every evening | ORAL | 1 refills | Status: DC | PRN

## 2015-11-05 MED ORDER — ALBUTEROL SULFATE HFA 108 (90 BASE) MCG/ACT IN AERS: 2.0000 | INHALATION_SPRAY | Freq: Four times a day (QID) | RESPIRATORY_TRACT | 0 refills | Status: DC | PRN

## 2015-11-05 MED ORDER — DOXYCYCLINE HYCLATE 100 MG PO TABS: 100.0000 mg | ORAL_TABLET | Freq: Two times a day (BID) | ORAL | 0 refills | Status: DC

## 2015-11-05 MED ORDER — PREDNISONE 20 MG PO TABS: 40.0000 mg | ORAL_TABLET | Freq: Every day | ORAL | 0 refills | Status: AC

## 2015-11-05 NOTE — Patient Instructions (Addendum)
A few things to remember from today's visit:   Chronic diarrhea  COPD exacerbation ([place]) - Plan: doxycycline (VIBRA-TABS) 100 MG tablet, albuterol (PROVENTIL HFA;VENTOLIN HFA) 108 (90 Base) MCG/ACT inhaler, predniSONE (DELTASONE) 20 MG tablet  Insomnia: Trazodone at bedtime.  Most likely viral but because chronic illness antibiotic recommended. Keep next appt.  Please be sure medication list is accurate. If a new problem present, please set up appointment sooner than planned today.

## 2015-11-05 NOTE — Progress Notes (Signed)
Pre visit review using our clinic review tool, if applicable. No additional management support is needed unless otherwise documented below in the visit note. 

## 2015-11-05 NOTE — Progress Notes (Signed)
ACUTE VISIT:  Chief Complaint  Patient presents with  . Cough    Started Thursday last week, productive cough-yellow phlegm, no fever, patient is feeling tired. Pt has used cough drops.    HPI:   [name] is a 82 y.o. male, who is here today with his wife complaining of 4-5 days of respiratory symptoms.  He feels like he is getting worse. He has not noted fever or chills.  +Productive cough with yellowish sputum and intermittent wheezing. Mild exertional dyspnea, denies chest pain.  He has history of COPD, in the past  Brio was recommended as well as Albuterol, he is not using neither medication.  + Fatigue (worse than usual), sore throat, postnasal drainage, rhinorrhea, nasal congestion, and myalgias. Cough keeps him from sleep.    No Hx of recent travel. No sick contact. No known insect bite. + Hx of allergic rhinitis.  Denies swimming in public pools, lakes, or rivers recently.  OTC medications for this problem: Cough drops.    -I saw him last on 10/16/2015, at that time loss until was recommended for chronic diarrhea. He wonders if he can take medication around the clock instead as needed. He has tolerating medication well, no new symptoms reported.  -History of insomnia, a few months ago I recommended trying trazodone. Last office visit he told me he was not taking medication. Since his last office visit he tried trazodone 25 mg and help with his sleep, he was able to sleep between 5 and 6 hours straight, no side effects reported.  -Today he is also complaining of persistent arthralgias and fatigue.  He has history of chronic pain, generalized arthritis.  Hx of OSA, did not tolerated CPAP.  Planning on total knee replacement, he didn't keep appointment with chiropractor (as he was planning last time I saw him), he has not heard about pain clinic appointment.  Today he mentions that he ran out of the medication, last office visit I explained that I  will not continue managing his chronic pain, we have tried a few medications (MS Contin, Hydrocodone, Percocet, and tramadol) with not improvement at all reported by patient.    Review of Systems  Constitutional: Positive for fatigue. Negative for activity change, appetite change, chills, diaphoresis and fever.  HENT: Positive for congestion, postnasal drip, rhinorrhea, sneezing and sore throat. Negative for ear pain, mouth sores, nosebleeds, trouble swallowing and voice change.   Eyes: Negative for discharge, redness and itching.  Respiratory: Positive for cough, shortness of breath and wheezing. Negative for chest tightness.   Cardiovascular: Positive for leg swelling (chronic). Negative for chest pain and palpitations.  Gastrointestinal: Negative for abdominal pain, nausea and vomiting.       No changes in bowel habits.  Musculoskeletal: Positive for arthralgias, back pain, gait problem and myalgias. Negative for joint swelling.  Skin: Negative for pallor and rash.  Neurological: Negative for syncope, weakness and headaches.  Psychiatric/Behavioral: Positive for sleep disturbance. Negative for confusion. The patient is nervous/anxious.       Current Outpatient Prescriptions on File Prior to Visit  Medication Sig Dispense Refill  . aspirin 81 MG EC tablet Take 81 mg by mouth daily.     . Cyanocobalamin (VITAMIN B 12 PO) Take by mouth. Reported on 08/12/2015    . diclofenac sodium (VOLTAREN) 1 % GEL Apply 4 g topically 4 (four) times daily. 400 g 2  . diphenoxylate-atropine (LOMOTIL) 2.5-0.025 MG tablet Take 1 tablet by mouth 3 (  three) times daily as needed for diarrhea or loose stools. 45 tablet 1  . doxazosin (CARDURA) 2 MG tablet Take by mouth.    . finasteride (PROSCAR) 5 MG tablet Take 1 tablet (5 mg total) by mouth daily. 90 tablet 3  . furosemide (LASIX) 40 MG tablet Take 1 tab (40 mg total) by mouth daily. You may take 1 extra lasix in the evening if you have SOB, LEE, and weight  gain > 3lbs in 24 hours 180 tablet 11  . gabapentin (NEURONTIN) 300 MG capsule 300 mg during the day and 600 mg qhs 270 capsule 1  . glipiZIDE (GLUCOTROL XL) 5 MG 24 hr tablet Take 1 tablet (5 mg total) by mouth daily with breakfast. 90 tablet 1  . Glucose Blood (FREESTYLE LITE TEST VI)     . metoprolol succinate (TOPROL-XL) 25 MG 24 hr tablet Take 1 tablet (25 mg total) by mouth daily. 90 tablet 1  . ONE TOUCH ULTRA TEST test strip     . ONETOUCH DELICA LANCETS 99991111 MISC USE TO CHECK BLOOD SUGAR DAILY AND PRN 100 each 4  . tamsulosin (FLOMAX) 0.4 MG CAPS capsule Take 1 capsule (0.4 mg total) by mouth daily after supper. 90 capsule 1  . timolol (TIMOPTIC-XR) 0.5 % ophthalmic gel-forming     . valsartan (DIOVAN) 320 MG tablet Take 1 tablet (320 mg total) by mouth daily. 90 tablet 3   No current facility-administered medications on file prior to visit.      Past Medical History:  Diagnosis Date  . BACK PAIN, CHRONIC 04/18/2009  . BENIGN PROSTATIC HYPERTROPHY, HX OF 01/19/2007  . Carpal tunnel syndrome 03/24/2007  . CORONARY ARTERY DISEASE 01/19/2007  . DEPRESSION, CHRONIC 06/24/2007  . DIABETES MELLITUS 01/19/2007  . DIABETES MELLITUS, TYPE II, WITH NEUROLOGICAL COMPLICATIONS 99991111  . Diarrhea 02/23/2007  . HEART MURMUR, SYSTOLIC 123XX123  . HERPES SIMPLEX INFECTION 01/19/2007  . HYPERLIPIDEMIA 01/19/2007  . HYPERTENSION 01/19/2007  . Intermediate coronary syndrome ([place]) 08/29/2008  . LEG CRAMPS, NOCTURNAL 10/28/2007  . Leg cramps, sleep related 08/05/2010  . OBSTRUCTIVE SLEEP APNEA 01/19/2007   does not use CPAP  . OSTEOARTHRITIS 06/24/2007  . Other postprocedural status(V45.89) 01/19/2007  . PERCUTANEOUS TRANSLUMINAL CORONARY ANGIOPLASTY, HX OF 01/19/2007  . PLANTAR FASCIITIS, RIGHT 01/19/2007  . SHOULDER PAIN, LEFT 03/24/2007  . SPINAL STENOSIS, LUMBAR 06/03/2009  . TINEA PEDIS 11/15/2009  . URI 01/11/2009   No Known Allergies  Social History   Social History  . Marital status: Married     Spouse name: N/A  . Number of children: N/A  . Years of education: N/A   Occupational History  . 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 Smoking status: Former Smoker    Quit date: 04/14/1983  . Smokeless tobacco: Never Used     Comment: stopped 1985  . Alcohol use Yes     Comment: 2-3 times a week - wine, scotch  . Drug use: No  . Sexual activity: Not Asked   Other Topics Concern  . None   Social History Narrative   Married in 1959 - 69yrs, divorced; married 1974 - 1.5 years, divorced; married 1985 1 daughter 41; 54 step daughters; 2 grandchildren.    Vitals:   11/05/15 1023  BP: (!) 142/72  Pulse: 69  Temp: 98.2 F (36.8 C)   Body mass index is 28.96 kg/m.  O2 sat at RA 96%.    Physical Exam  Nursing note and vitals reviewed. Constitutional: He is oriented to person, place, and time. He appears well-developed. He does not appear ill. No distress.  HENT:  Head: Atraumatic.  Right Ear: Tympanic membrane, external ear and ear canal normal.  Left Ear: Tympanic membrane, external ear and ear canal normal.  Nose: Rhinorrhea present. Right sinus exhibits no maxillary sinus tenderness and no frontal sinus tenderness. Left sinus exhibits no maxillary sinus tenderness and no frontal sinus tenderness.  Mouth/Throat: Oropharynx is clear and moist and mucous membranes are normal.  Hypertrophic turbinates. Postnasal drainage. Hearing aid bilateral.  Eyes: Conjunctivae and EOM are normal.  Neck: No thyroid mass present.  Cardiovascular: Normal rate and regular rhythm.  Exam reveals no gallop.   Murmur (SEM II/VI base) heard. Respiratory: Effort normal. No respiratory distress. He has wheezes (mild, diffuse). He has no rales.  Musculoskeletal: He exhibits edema (1+ pitting edema LE bilateral.). He exhibits no tenderness.  Lymphadenopathy:       Head (right side): No submandibular adenopathy present.       Head (left side): No  submandibular adenopathy present.    He has cervical adenopathy.       Right cervical: Superficial cervical adenopathy present.       Left cervical: Superficial cervical adenopathy present.  Neurological: He is alert and oriented to person, place, and time. He has normal strength. Coordination normal.  Stable gait, assisted with cane  Skin: Skin is warm. No rash noted. No erythema.  Psychiatric: His speech is normal. His mood appears anxious.  Well groomed,good eye contact.      ASSESSMENT AND PLAN:     Ashden was seen today for cough.  Diagnoses and all orders for this visit:  COPD exacerbation ([place])  Mild, I don't think imaging is needed at this time. Clearly instructed about warning signs, side effects of Prednisone discussed, he has taken it in the past and has been well tolerated. Albuterol 2 puffs every 6 hours for a week and then as needed. For now we will hold on Brio. Keep next follow-up appointment.  -     doxycycline (VIBRA-TABS) 100 MG tablet; Take 1 tablet (100 mg total) by mouth 2 (two) times daily. -     albuterol (PROVENTIL HFA;VENTOLIN HFA) 108 (90 Base) MCG/ACT inhaler; Inhale 2 puffs into the lungs every 6 (six) hours as needed for wheezing or shortness of breath. -     predniSONE (DELTASONE) 20 MG tablet; Take 2 tablets (40 mg total) by mouth daily with breakfast.  Insomnia, unspecified  Good sleep hygiene. He seems like Trazodone help, so recommended taking it nightly, some side effects discussed.  -     traZODone (DESYREL) 50 MG tablet; Take 0.5 tablets (25 mg total) by mouth at bedtime as needed for sleep.  Chronic diarrhea  He can take Lomotil twice daily and he can take an extra tablet as needed. Instructed about warning signs. Keep next appointment, when we were supposed to follow on this problem.   Generalized osteoarthritis of multiple sites  Discussed the natural history of osteoarthritis, I also explained I would not manage his pain  because he has not improved at all with medications I have recommended in the past. Pain clinic appointment is being arranged. Fall precautions discussed. Avoid activities he knows exacerbate pain. He will continue following with orthopedist for knee OA.      -Mr. ALONTE HOLDAWAY was advised to return sooner than planned today if new concerns arise, he and his  wife voice understanding.       Triva Hueber G. [name], MD  [facility]. [place] office.

## 2015-11-25 ENCOUNTER — Ambulatory Visit: Payer: PPO | Admitting: Family Medicine

## 2015-11-27 ENCOUNTER — Encounter: Payer: Self-pay | Admitting: Family Medicine

## 2015-11-28 ENCOUNTER — Ambulatory Visit: Payer: PPO | Admitting: Family Medicine

## 2015-11-29 ENCOUNTER — Ambulatory Visit: Payer: PPO

## 2015-11-29 ENCOUNTER — Ambulatory Visit: Payer: PPO | Admitting: Family Medicine

## 2015-12-10 ENCOUNTER — Ambulatory Visit (INDEPENDENT_AMBULATORY_CARE_PROVIDER_SITE_OTHER): Payer: PPO | Admitting: Family Medicine

## 2015-12-10 ENCOUNTER — Encounter: Payer: Self-pay | Admitting: Family Medicine

## 2015-12-10 VITALS — BP 140/80 | HR 63 | Temp 97.6°F | Resp 12 | Ht 65.0 in | Wt 178.0 lb

## 2015-12-10 DIAGNOSIS — K529 Noninfective gastroenteritis and colitis, unspecified: Secondary | ICD-10-CM | POA: Diagnosis not present

## 2015-12-10 DIAGNOSIS — R252 Cramp and spasm: Secondary | ICD-10-CM | POA: Diagnosis not present

## 2015-12-10 DIAGNOSIS — M159 Polyosteoarthritis, unspecified: Secondary | ICD-10-CM

## 2015-12-10 DIAGNOSIS — G4700 Insomnia, unspecified: Secondary | ICD-10-CM

## 2015-12-10 MED ORDER — DICLOFENAC SODIUM 1 % TD GEL: 4.0000 g | Freq: Four times a day (QID) | TRANSDERMAL | 2 refills | Status: DC

## 2015-12-10 MED ORDER — DOXEPIN HCL 10 MG PO CAPS: 10.0000 mg | ORAL_CAPSULE | Freq: Every day | ORAL | 1 refills | Status: DC

## 2015-12-10 NOTE — Progress Notes (Signed)
Pre visit review using our clinic review tool, if applicable. No additional management support is needed unless otherwise documented below in the visit note. 

## 2015-12-10 NOTE — Patient Instructions (Addendum)
A few things to remember from today's visit:   Chronic diarrhea  Insomnia, unspecified - Plan: doxepin (SINEQUAN) 10 MG capsule  Generalized osteoarthritis of multiple sites - Plan: diclofenac sodium (VOLTAREN) 1 % GEL  Cramp of limb  Continue Voltaren gel on this as needed for pain.  Doxepin might help with sleeping. Continue Lomotil until twice daily. Please let me know thorough my chart how she is doing with the new medication.  Keep appointment with pain management.  Please be sure medication list is accurate. If a new problem present, please set up appointment sooner than planned today.

## 2015-12-10 NOTE — Progress Notes (Signed)
HPI:   [name] is a 82 y.o. male, who is here today with his wife to follow on some of his chronic medical problems.   [name] was seen last on 10/16/2015, at that time Lomotil the was started for chronic diarrhea, IBS- diarrhea, which he has suffered for many years. It did not improved after discontinuing Metformin.  His taking Lomotil twice daily and has noted greatly improvement in diarrhea, now having 1 or 2 stools daily. No abdominal pain or blood in the stool reported.   Lab Results  Component Value Date   TSH 1.60 10/16/2015    Chronic pain/generalized OA: Mainy knees,shoulders,hips, and back pain. He has decided not to pursue total knee replacement, missed f/u appt with ortho because a friend died. He has appt with pain management on 01/01/16.  He started OTC glucosamine liquid and he feels like it has helped with knee pain some, he is also taking Aleve daily. In the past we have tried Tramadol, Hydrocodone, Oxycodone, and MS Contin but neither one help with the pain. He didn't tolerate Cymbalta well.  History of muscle cramps, reporting improvement in frequency after starting Gabapentin, which he is taking 300 mg in the morning and 600 mg at night. He had one today morning, RLE, improved after stretching leg and massage, this is the first one, this bad, since he started Gabapentin, used to be like this daily when he was in bed, frequently his wife had to get up to massage area and help him stretch. No worsening edema and no leg erythema.   He did complete PT, he is planning on resuming exercises at home because he felt like it helped with arthalgias.   Insomnia: Last office visit he stated that Trazodone was helping, so it was resumed.  Today he states that he is not sure if Trazodone helps, sometimes it takes 25 mg a he can sleep all night but other times he takes 50 mg and cannot sleep. Usually alcohol at bedtime helps a great deal, he states that he  drinks whiskey 3 oz mixed with tomato juice and it seems to help him sleep better.    No other concerns today.    Review of Systems  Constitutional: Positive for fatigue (no more than usual). Negative for appetite change, fever and unexpected weight change.  Respiratory: Negative for cough, shortness of breath and wheezing.           Cardiovascular: Positive for leg swelling (at baseline). Negative for chest pain and palpitations.       No orthopnea or PND.  Gastrointestinal: Positive for diarrhea (improved). Negative for abdominal pain, blood in stool, nausea and vomiting.  Musculoskeletal: Positive for arthralgias, back pain, gait problem and myalgias. Negative for joint swelling.  Neurological: Negative for seizures, syncope, weakness and headaches.  Psychiatric/Behavioral: Positive for sleep disturbance. Negative for confusion. The patient is not nervous/anxious.       Current Outpatient Prescriptions on File Prior to Visit  Medication Sig Dispense Refill  . albuterol (PROVENTIL HFA;VENTOLIN HFA) 108 (90 Base) MCG/ACT inhaler Inhale 2 puffs into the lungs every 6 (six) hours as needed for wheezing or shortness of breath. 1 Inhaler 0  . aspirin 81 MG EC tablet Take 81 mg by mouth daily.     . Cyanocobalamin (VITAMIN B 12 PO) Take by mouth. Reported on 08/12/2015    . diphenoxylate-atropine (LOMOTIL) 2.5-0.025 MG tablet Take 1 tablet by mouth 3 (three) times daily as  needed for diarrhea or loose stools. 45 tablet 1  . doxazosin (CARDURA) 2 MG tablet Take by mouth.    . finasteride (PROSCAR) 5 MG tablet Take 1 tablet (5 mg total) by mouth daily. 90 tablet 3  . furosemide (LASIX) 40 MG tablet Take 1 tab (40 mg total) by mouth daily. You may take 1 extra lasix in the evening if you have SOB, LEE, and weight gain > 3lbs in 24 hours 180 tablet 11  . gabapentin (NEURONTIN) 300 MG capsule 300 mg during the day and 600 mg qhs 270 capsule 1  . glipiZIDE (GLUCOTROL XL) 5 MG 24 hr tablet Take 1  tablet (5 mg total) by mouth daily with breakfast. 90 tablet 1  . Glucose Blood (FREESTYLE LITE TEST VI)     . metoprolol succinate (TOPROL-XL) 25 MG 24 hr tablet Take 1 tablet (25 mg total) by mouth daily. 90 tablet 1  . ONE TOUCH ULTRA TEST test strip     . ONETOUCH DELICA LANCETS 99991111 MISC USE TO CHECK BLOOD SUGAR DAILY AND PRN 100 each 4  . tamsulosin (FLOMAX) 0.4 MG CAPS capsule Take 1 capsule (0.4 mg total) by mouth daily after supper. 90 capsule 1  . timolol (TIMOPTIC-XR) 0.5 % ophthalmic gel-forming     . valsartan (DIOVAN) 320 MG tablet Take 1 tablet (320 mg total) by mouth daily. 90 tablet 3   No current facility-administered medications on file prior to visit.      Past Medical History:  Diagnosis Date  . BACK PAIN, CHRONIC 04/18/2009  . BENIGN PROSTATIC HYPERTROPHY, HX OF 01/19/2007  . Carpal tunnel syndrome 03/24/2007  . CORONARY ARTERY DISEASE 01/19/2007  . DEPRESSION, CHRONIC 06/24/2007  . DIABETES MELLITUS 01/19/2007  . DIABETES MELLITUS, TYPE II, WITH NEUROLOGICAL COMPLICATIONS 99991111  . Diarrhea 02/23/2007  . HEART MURMUR, SYSTOLIC 123XX123  . HERPES SIMPLEX INFECTION 01/19/2007  . HYPERLIPIDEMIA 01/19/2007  . HYPERTENSION 01/19/2007  . Intermediate coronary syndrome ([place]) 08/29/2008  . LEG CRAMPS, NOCTURNAL 10/28/2007  . Leg cramps, sleep related 08/05/2010  . OBSTRUCTIVE SLEEP APNEA 01/19/2007   does not use CPAP  . OSTEOARTHRITIS 06/24/2007  . Other postprocedural status(V45.89) 01/19/2007  . PERCUTANEOUS TRANSLUMINAL CORONARY ANGIOPLASTY, HX OF 01/19/2007  . PLANTAR FASCIITIS, RIGHT 01/19/2007  . SHOULDER PAIN, LEFT 03/24/2007  . SPINAL STENOSIS, LUMBAR 06/03/2009  . TINEA PEDIS 11/15/2009  . URI 01/11/2009   No Known Allergies  Social History   Social History  . Marital status: Married    Spouse name: N/A  . Number of children: N/A  . Years of education: N/A   Occupational History  . truck Educational psychologist for [employer]    . Smoking status: Former Smoker    Quit date: 04/14/1983  . Smokeless tobacco: Never Used     Comment: stopped 1985  . Alcohol use Yes     Comment: 2-3 times a week - wine, scotch  . Drug use: No  . Sexual activity: Not Asked   Other Topics Concern  . None   Social History Narrative   Married in 1959 - 2yrs, divorced; married 1974 - 1.5 years, divorced; married 1985 1 daughter 52; 17 step daughters; 2 grandchildren.    Vitals:   12/10/15 1018  BP: 140/80  Pulse: 63  Resp: 12  Temp: 97.6 F (36.4 C)   Body mass index is 29.62 kg/m.      Physical Exam  Nursing note and vitals reviewed.  Constitutional: He is oriented to person, place, and time. He appears well-developed. No distress.  HENT:  Head: Atraumatic.  Mouth/Throat: Oropharynx is clear and moist and mucous membranes are normal.  Eyes: Conjunctivae and EOM are normal.  Cardiovascular: Normal rate and regular rhythm.  Exam reveals no gallop.   Murmur (SEM II/VI base) heard. Pulses:      Posterior tibial pulses are 2+ on the right side, and 2+ on the left side.  Respiratory: Effort normal and breath sounds normal. No respiratory distress.  GI: Soft. He exhibits no mass. There is no hepatomegaly. There is no tenderness.  Musculoskeletal: He exhibits edema (2+ pitting edema LE bilateral.).  No calves tenderness.  Neurological: He is alert and oriented to person, place, and time. He has normal strength. Coordination normal.  Stable gait, assisted with cane  Skin: Skin is warm. No erythema.  Psychiatric: He has a normal mood and affect. Cognition and memory are normal.  Well groomed,good eye contact.      ASSESSMENT AND PLAN:     Caswell was seen today for follow-up.  Diagnoses and all orders for this visit:  Chronic diarrhea  Reporting improvement, continue Lomotil bid. Doxepin may help as well. Colonoscopy 01/2010 through the [place], severe diverticulosis sigmoid colon and 3 diminutive  polyps.   Insomnia, unspecified  Stop Trazodone. Alcohol adverse effects on sleep discussed, recommend < 4 oz daily. Doxepin 10 mg may help, some side effects discussed. He will let me know through MyChart if it helps and if any side effect. Other option if this medication doe snot help could be Remeron. Good sleep hygiene. F/U in 3-4 months.  -     doxepin (SINEQUAN) 10 MG capsule; Take 1 capsule (10 mg total) by mouth at bedtime.  Generalized osteoarthritis of multiple sites  Caution with Aleve, some side effects of NSAIDs discussed given his history of CHF and hypertension. I prefer topical NSAID's, Voltaren. Keep appt with pain management.  -     diclofenac sodium (VOLTAREN) 1 % GEL; Apply 4 g topically 4 (four) times daily.  Cramp of limb  For now continue Gabapentin. Resume stretching exercises. I do not think labs are needed today given the fact this is a chronic problem and otherwise improved.       -Mr. LUCKY GARCES was advised to return sooner than planned today if new concerns arise.       Jahmere Bramel G. [name], MD  [facility]. [place] office.

## 2015-12-13 ENCOUNTER — Encounter: Payer: Self-pay | Admitting: Family Medicine

## 2015-12-23 ENCOUNTER — Encounter: Payer: Self-pay | Admitting: Family Medicine

## 2015-12-23 ENCOUNTER — Telehealth: Payer: Self-pay

## 2015-12-23 ENCOUNTER — Other Ambulatory Visit: Payer: Self-pay | Admitting: Family Medicine

## 2015-12-23 DIAGNOSIS — K529 Noninfective gastroenteritis and colitis, unspecified: Secondary | ICD-10-CM

## 2015-12-23 DIAGNOSIS — J441 Chronic obstructive pulmonary disease with (acute) exacerbation: Secondary | ICD-10-CM

## 2015-12-23 MED ORDER — DOXAZOSIN MESYLATE 2 MG PO TABS
2.0000 mg | ORAL_TABLET | Freq: Every day | ORAL | 1 refills | Status: DC
Start: 2015-12-23 — End: 2016-06-15

## 2015-12-23 MED ORDER — DIPHENOXYLATE-ATROPINE 2.5-0.025 MG PO TABS: 1.0000 | ORAL_TABLET | Freq: Two times a day (BID) | ORAL | 0 refills | Status: DC

## 2015-12-23 NOTE — Telephone Encounter (Signed)
It is Ok to call in for Lomotil to take bid # [phone]. Thanks, BJ

## 2015-12-23 NOTE — Telephone Encounter (Signed)
Patient wants a refill on the Lomotil for 90 days, patient is out. He was seen on 8.29.17 for chronic diarrhea. Refill okay?

## 2015-12-23 NOTE — Telephone Encounter (Signed)
Rx faxed

## 2015-12-23 NOTE — Telephone Encounter (Signed)
Rx printed for signature. 

## 2015-12-29 ENCOUNTER — Encounter: Payer: Self-pay | Admitting: Family Medicine

## 2015-12-30 ENCOUNTER — Other Ambulatory Visit: Payer: Self-pay

## 2015-12-30 MED ORDER — TAMSULOSIN HCL 0.4 MG PO CAPS: 0.4000 mg | ORAL_CAPSULE | Freq: Every day | ORAL | 2 refills | Status: DC

## 2015-12-30 MED ORDER — ONETOUCH ULTRA BLUE VI STRP: ORAL_STRIP | 4 refills | Status: DC

## 2016-01-01 ENCOUNTER — Encounter: Payer: PPO | Attending: Physical Medicine & Rehabilitation | Admitting: Physical Medicine & Rehabilitation

## 2016-01-01 ENCOUNTER — Encounter: Payer: Self-pay | Admitting: Physical Medicine & Rehabilitation

## 2016-01-01 VITALS — BP 147/80 | HR 56

## 2016-01-01 DIAGNOSIS — F329 Major depressive disorder, single episode, unspecified: Secondary | ICD-10-CM | POA: Insufficient documentation

## 2016-01-01 DIAGNOSIS — N400 Enlarged prostate without lower urinary tract symptoms: Secondary | ICD-10-CM | POA: Insufficient documentation

## 2016-01-01 DIAGNOSIS — G5600 Carpal tunnel syndrome, unspecified upper limb: Secondary | ICD-10-CM | POA: Insufficient documentation

## 2016-01-01 DIAGNOSIS — Z87891 Personal history of nicotine dependence: Secondary | ICD-10-CM | POA: Insufficient documentation

## 2016-01-01 DIAGNOSIS — E1142 Type 2 diabetes mellitus with diabetic polyneuropathy: Secondary | ICD-10-CM

## 2016-01-01 DIAGNOSIS — I2510 Atherosclerotic heart disease of native coronary artery without angina pectoris: Secondary | ICD-10-CM | POA: Diagnosis not present

## 2016-01-01 DIAGNOSIS — M4806 Spinal stenosis, lumbar region: Secondary | ICD-10-CM | POA: Insufficient documentation

## 2016-01-01 DIAGNOSIS — M722 Plantar fascial fibromatosis: Secondary | ICD-10-CM | POA: Diagnosis not present

## 2016-01-01 DIAGNOSIS — E1140 Type 2 diabetes mellitus with diabetic neuropathy, unspecified: Secondary | ICD-10-CM | POA: Insufficient documentation

## 2016-01-01 DIAGNOSIS — E785 Hyperlipidemia, unspecified: Secondary | ICD-10-CM | POA: Diagnosis not present

## 2016-01-01 DIAGNOSIS — I110 Hypertensive heart disease with heart failure: Secondary | ICD-10-CM | POA: Diagnosis not present

## 2016-01-01 DIAGNOSIS — M170 Bilateral primary osteoarthritis of knee: Secondary | ICD-10-CM | POA: Diagnosis not present

## 2016-01-01 DIAGNOSIS — Z981 Arthrodesis status: Secondary | ICD-10-CM | POA: Diagnosis not present

## 2016-01-01 DIAGNOSIS — R269 Unspecified abnormalities of gait and mobility: Secondary | ICD-10-CM | POA: Insufficient documentation

## 2016-01-01 DIAGNOSIS — J449 Chronic obstructive pulmonary disease, unspecified: Secondary | ICD-10-CM | POA: Insufficient documentation

## 2016-01-01 MED ORDER — GABAPENTIN 600 MG PO TABS: 600.0000 mg | ORAL_TABLET | Freq: Three times a day (TID) | ORAL | 0 refills | Status: DC

## 2016-01-01 NOTE — Progress Notes (Signed)
Subjective:    Patient ID: [name], male    DOB: 1931/09/30, 82 y.o.   MRN: [id number]  HPI  82 y/o with pmh/psh of of generalized OA, spinal stenosis, DM with neuropathy, CTS, COPD, CAD, plantar fascitis, lumbar fusion presents with b/l knee pain.  Pain anterior knee.  Pain started ~2000.  Denies inciting events.  Getting progressively worse.  Resting improves the pain.  Walking, standing exacerbates the pain.  Achy pain. Non-radiating.  Denies associated numbness, tingling, weakness.  Pt cortisone shots, which helped, last one was 1 year ago.  Denies falls. Pain limits from dancing,standing for long periods, and walking long distances.    Pain Inventory Average Pain 7 Pain Right Now 7 My pain is sharp and aching  In the last 24 hours, has pain interfered with the following? General activity 8 Relation with others 8 Enjoyment of life 8 What TIME of day is your pain at its worst? no selection Sleep (in general) Poor  Pain is worse with: walking and bending Pain improves with: rest and injections Relief from Meds: 5  Mobility walk without assistance walk with assistance use a cane use a walker ability to climb steps?  yes do you drive?  yes  Function retired  Neuro/Psych bladder control problems bowel control problems weakness trouble walking depression  Prior Studies new visit  Physicians involved in your care Primary care [name], MD new visit   Family History  Problem Relation Age of Onset  . Diabetes Other   . Coronary artery disease Other    Social History   Social History  . Marital status: Married    Spouse name: N/A  . Number of children: N/A  . Years of education: N/A   Occupational History  . 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 Smoking status: Former Smoker    Quit date: 04/14/1983  . Smokeless tobacco: Never Used     Comment: stopped 1985  . Alcohol use Yes     Comment: 2-3 times a  week - wine, scotch  . Drug use: No  . Sexual activity: Not Asked   Other Topics Concern  . None   Social History Narrative   Married in 1959 - 39yrs, divorced; married 1974 - 1.5 years, divorced; married 1985 1 daughter 58; 106 step daughters; 2 grandchildren.   Past Surgical History:  Procedure Laterality Date  . arthroscopy, knee of hx    . CARDIAC CATHETERIZATION    . COLONOSCOPY    . inguinal herniorrhapy, hx    . NASAL SINUS SURGERY    . percutaneous transluminal coronary angioplasty, hx of  2004   Dr. Lyndel Safe  . plastic joint in thumb    . PTCA/stent  2004  . TONSILLECTOMY    . VASECTOMY     Past Medical History:  Diagnosis Date  . BACK PAIN, CHRONIC 04/18/2009  . BENIGN PROSTATIC HYPERTROPHY, HX OF 01/19/2007  . Carpal tunnel syndrome 03/24/2007  . CORONARY ARTERY DISEASE 01/19/2007  . DEPRESSION, CHRONIC 06/24/2007  . DIABETES MELLITUS 01/19/2007  . DIABETES MELLITUS, TYPE II, WITH NEUROLOGICAL COMPLICATIONS 99991111  . Diarrhea 02/23/2007  . HEART MURMUR, SYSTOLIC 123XX123  . HERPES SIMPLEX INFECTION 01/19/2007  . HYPERLIPIDEMIA 01/19/2007  . HYPERTENSION 01/19/2007  . Intermediate coronary syndrome ([place]) 08/29/2008  . LEG CRAMPS, NOCTURNAL 10/28/2007  . Leg cramps, sleep related 08/05/2010  . OBSTRUCTIVE SLEEP APNEA 01/19/2007   does not use CPAP  .  OSTEOARTHRITIS 06/24/2007  . Other postprocedural status(V45.89) 01/19/2007  . PERCUTANEOUS TRANSLUMINAL CORONARY ANGIOPLASTY, HX OF 01/19/2007  . PLANTAR FASCIITIS, RIGHT 01/19/2007  . SHOULDER PAIN, LEFT 03/24/2007  . SPINAL STENOSIS, LUMBAR 06/03/2009  . TINEA PEDIS 11/15/2009  . URI 01/11/2009   BP (!) 147/80 (BP Location: Right Arm, Patient Position: Sitting, Cuff Size: Large)   Pulse (!) 56   SpO2 94%   Opioid Risk Score:   Fall Risk Score:  `1  Depression screen PHQ 2/9  Depression screen PHQ 2/9 01/01/2016  Decreased Interest 2  Down, Depressed, Hopeless 1  PHQ - 2 Score 3  Altered sleeping 2  Tired,  decreased energy 3  Change in appetite 0  Feeling bad or failure about yourself  0  Trouble concentrating 2  Moving slowly or fidgety/restless 0  Suicidal thoughts 0  PHQ-9 Score 10  Difficult doing work/chores Somewhat difficult    Review of Systems  HENT: Negative.   Eyes: Negative.   Respiratory: Negative.   Cardiovascular: Negative.   Gastrointestinal: Positive for diarrhea.  Endocrine: Negative.        High blood sugar  Genitourinary: Positive for difficulty urinating.  Musculoskeletal: Positive for arthralgias and gait problem.  Allergic/Immunologic: Negative.   Neurological: Positive for weakness.  Psychiatric/Behavioral: Positive for dysphoric mood.  All other systems reviewed and are negative.     Objective:   Physical Exam Gen: NAD. Vital signs reviewed HENT: Normocephalic, Atraumatic Eyes: EOMI, Conj WNL Cardio: S1, S2 normal, RRR Pulm: B/l clear to auscultation.  Effort normal Abd: Soft, non-distended, non-tender, BS+ MSK:  Gait slightly antalgic.   Mild TTP proximal medial knee.    No edema.   PROM WNL Neuro: CN II-XII grossly intact.    Sensation intact to light touch in all LE dermatomes  Reflexes 2+ throughout  Strength  5/5 in all LE myotomes Skin: Warm and Dry    Assessment & Plan:  83 y/o with pmh/psh of of generalized OA, spinal stenosis, DM with neuropathy, CTS, COPD, CHF, CAD, plantar fascitis, lumbar fusion presents with b/l knee pain.  1. OA of knees  Xrays reviewed, showing bilateral medial compartment end-stage disease  NSAIDs not appropriate due to CAD  Pt not interested in tramadol at present  S/p Arthroscopic 15 years ago.  Pt evaluated by Ortho recommending TKA  Cortisone effective in past  Voltaren gel 4/day  Will refer to PT for consideration of quad strengthening and TENS eval  Will consider cortisone injections in future and possible Synvisc (tried many years ago)  Will consider Biowave in future  Encouraged pt to follow up  with pool therapy  Will consider shoe wedges in future   2. Diabetic neuropathy  Gabapentin increased to 600 TID  Adjust meds per PCP   3. Abnormality of gait  Cont single point cane for safety

## 2016-01-16 ENCOUNTER — Encounter: Payer: Self-pay | Admitting: Physical Medicine & Rehabilitation

## 2016-01-17 DIAGNOSIS — H401191 Primary open-angle glaucoma, unspecified eye, mild stage: Secondary | ICD-10-CM | POA: Diagnosis not present

## 2016-01-17 DIAGNOSIS — H401111 Primary open-angle glaucoma, right eye, mild stage: Secondary | ICD-10-CM | POA: Diagnosis not present

## 2016-01-17 DIAGNOSIS — Q150 Congenital glaucoma: Secondary | ICD-10-CM | POA: Diagnosis not present

## 2016-01-29 ENCOUNTER — Encounter: Payer: PPO | Attending: Physical Medicine & Rehabilitation | Admitting: Physical Medicine & Rehabilitation

## 2016-01-29 ENCOUNTER — Encounter: Payer: Self-pay | Admitting: Physical Medicine & Rehabilitation

## 2016-01-29 VITALS — BP 163/85 | HR 54 | Resp 14

## 2016-01-29 DIAGNOSIS — E1142 Type 2 diabetes mellitus with diabetic polyneuropathy: Secondary | ICD-10-CM

## 2016-01-29 DIAGNOSIS — Z87891 Personal history of nicotine dependence: Secondary | ICD-10-CM | POA: Insufficient documentation

## 2016-01-29 DIAGNOSIS — I509 Heart failure, unspecified: Secondary | ICD-10-CM | POA: Insufficient documentation

## 2016-01-29 DIAGNOSIS — Z9889 Other specified postprocedural states: Secondary | ICD-10-CM | POA: Insufficient documentation

## 2016-01-29 DIAGNOSIS — M48061 Spinal stenosis, lumbar region without neurogenic claudication: Secondary | ICD-10-CM | POA: Insufficient documentation

## 2016-01-29 DIAGNOSIS — E1140 Type 2 diabetes mellitus with diabetic neuropathy, unspecified: Secondary | ICD-10-CM | POA: Insufficient documentation

## 2016-01-29 DIAGNOSIS — M25562 Pain in left knee: Secondary | ICD-10-CM

## 2016-01-29 DIAGNOSIS — G8929 Other chronic pain: Secondary | ICD-10-CM

## 2016-01-29 DIAGNOSIS — R269 Unspecified abnormalities of gait and mobility: Secondary | ICD-10-CM | POA: Insufficient documentation

## 2016-01-29 DIAGNOSIS — I2510 Atherosclerotic heart disease of native coronary artery without angina pectoris: Secondary | ICD-10-CM | POA: Diagnosis not present

## 2016-01-29 DIAGNOSIS — J449 Chronic obstructive pulmonary disease, unspecified: Secondary | ICD-10-CM | POA: Insufficient documentation

## 2016-01-29 DIAGNOSIS — G5600 Carpal tunnel syndrome, unspecified upper limb: Secondary | ICD-10-CM | POA: Insufficient documentation

## 2016-01-29 DIAGNOSIS — M25561 Pain in right knee: Secondary | ICD-10-CM | POA: Diagnosis not present

## 2016-01-29 DIAGNOSIS — F329 Major depressive disorder, single episode, unspecified: Secondary | ICD-10-CM | POA: Insufficient documentation

## 2016-01-29 DIAGNOSIS — M170 Bilateral primary osteoarthritis of knee: Secondary | ICD-10-CM | POA: Diagnosis not present

## 2016-01-29 MED ORDER — TRAMADOL HCL 50 MG PO TABS: 50.0000 mg | ORAL_TABLET | Freq: Every day | ORAL | 0 refills | Status: DC | PRN

## 2016-01-29 NOTE — Progress Notes (Signed)
Subjective:    Patient ID: [name], male    DOB: 1931/08/01, 82 y.o.   MRN: [id number]  HPI  82 y/o with pmh/psh of of generalized OA, spinal stenosis, DM with neuropathy, CTS, COPD, CAD, plantar fascitis, lumbar fusion presents with b/l knee pain.  Pain anterior knee.  Pain started ~2000.  Denies inciting events.  Getting progressively worse.  Resting improves the pain.  Walking, standing exacerbates the pain.  Achy pain. Non-radiating.  Denies associated numbness, tingling, weakness.  Pt cortisone shots, which helped, last one was 1 year ago.  Denies falls. Pain limits from dancing,standing for long periods, and walking long distances.    Last clinic visit 12/31/45.  Pt is open to open trying tramadol.  He believes Voltaren gel.  Pt has not seen PT.  Pt did not see pool therapy, because of some confusion.    Pain Inventory Average Pain 7 Pain Right Now 7 My pain is dull  In the last 24 hours, has pain interfered with the following? General activity 7 Relation with others 7 Enjoyment of life 7 What TIME of day is your pain at its worst? no selection Sleep (in general) Poor  Pain is worse with: walking, bending and standing Pain improves with: rest and injections Relief from Meds: 6  Mobility walk without assistance walk with assistance use a cane use a walker ability to climb steps?  yes do you drive?  yes  Function retired  Neuro/Psych bladder control problems bowel control problems weakness trouble walking  Prior Studies Any changes since last visit?  no  Physicians involved in your care Any changes since last visit?  no Primary care Andrew [name], MD   Family History  Problem Relation Age of Onset  . Diabetes Other   . Coronary artery disease Other    Social History   Social History  . Marital status: Married    Spouse name: N/A  . Number of children: N/A  . Years of education: N/A   Occupational History  . 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 Smoking status: Former Smoker    Quit date: 04/14/1983  . Smokeless tobacco: Never Used     Comment: stopped 1985  . Alcohol use Yes     Comment: 2-3 times a week - wine, scotch  . Drug use: No  . Sexual activity: Not Asked   Other Topics Concern  . None   Social History Narrative   Married in 1959 - 29yrs, divorced; married 1974 - 1.5 years, divorced; married 1985 1 daughter 71; 27 step daughters; 2 grandchildren.   Past Surgical History:  Procedure Laterality Date  . arthroscopy, knee of hx    . CARDIAC CATHETERIZATION    . COLONOSCOPY    . inguinal herniorrhapy, hx    . NASAL SINUS SURGERY    . percutaneous transluminal coronary angioplasty, hx of  2004   Dr. Lyndel Safe  . plastic joint in thumb    . PTCA/stent  2004  . TONSILLECTOMY    . VASECTOMY     Past Medical History:  Diagnosis Date  . BACK PAIN, CHRONIC 04/18/2009  . BENIGN PROSTATIC HYPERTROPHY, HX OF 01/19/2007  . Carpal tunnel syndrome 03/24/2007  . CORONARY ARTERY DISEASE 01/19/2007  . DEPRESSION, CHRONIC 06/24/2007  . DIABETES MELLITUS 01/19/2007  . DIABETES MELLITUS, TYPE II, WITH NEUROLOGICAL COMPLICATIONS 99991111  . Diarrhea 02/23/2007  . HEART MURMUR, SYSTOLIC 123XX123  .  HERPES SIMPLEX INFECTION 01/19/2007  . HYPERLIPIDEMIA 01/19/2007  . HYPERTENSION 01/19/2007  . Intermediate coronary syndrome ([place]) 08/29/2008  . LEG CRAMPS, NOCTURNAL 10/28/2007  . Leg cramps, sleep related 08/05/2010  . OBSTRUCTIVE SLEEP APNEA 01/19/2007   does not use CPAP  . OSTEOARTHRITIS 06/24/2007  . Other postprocedural status(V45.89) 01/19/2007  . PERCUTANEOUS TRANSLUMINAL CORONARY ANGIOPLASTY, HX OF 01/19/2007  . PLANTAR FASCIITIS, RIGHT 01/19/2007  . SHOULDER PAIN, LEFT 03/24/2007  . SPINAL STENOSIS, LUMBAR 06/03/2009  . TINEA PEDIS 11/15/2009  . URI 01/11/2009   BP (!) 163/85 (BP Location: Right Arm, Patient Position: Sitting, Cuff Size: Large)   Pulse (!) 54   Resp 14   SpO2 95%    Opioid Risk Score:   Fall Risk Score:  `1  Depression screen PHQ 2/9  Depression screen PHQ 2/9 01/01/2016  Decreased Interest 2  Down, Depressed, Hopeless 1  PHQ - 2 Score 3  Altered sleeping 2  Tired, decreased energy 3  Change in appetite 0  Feeling bad or failure about yourself  0  Trouble concentrating 2  Moving slowly or fidgety/restless 0  Suicidal thoughts 0  PHQ-9 Score 10  Difficult doing work/chores Somewhat difficult    Review of Systems  HENT: Negative.   Eyes: Negative.   Respiratory: Negative.   Cardiovascular: Negative.   Gastrointestinal: Positive for diarrhea.  Endocrine: Negative.        High blood sugar  Genitourinary: Positive for difficulty urinating.  Musculoskeletal: Positive for arthralgias and gait problem.  Allergic/Immunologic: Negative.   Neurological: Positive for weakness.  All other systems reviewed and are negative.     Objective:   Physical Exam Gen: NAD. Vital signs reviewed HENT: Normocephalic, Atraumatic Eyes: EOMI, Conj WNL Cardio: S1, S2 normal, RRR Pulm: B/l clear to auscultation.  Effort normal Abd: Soft, non-distended, non-tender, BS+ MSK:  Gait slightly antalgic.   Mild TTP proximal medial knee.    No edema.   PROM WNL Neuro: CN II-XII grossly intact.    Sensation intact to light touch in all LE dermatomes  Reflexes 2+ throughout  Strength  5/5 in all LE myotomes Skin: Warm and Dry    Assessment & Plan:  82 y/o with pmh/psh of of generalized OA, spinal stenosis, DM with neuropathy, CTS, COPD, CHF, CAD, plantar fascitis, lumbar fusion presents with b/l knee pain.  1. OA of knees  Xrays reviewed, showing bilateral medial compartment end-stage disease  NSAIDs not appropriate due to CAD  S/p Arthroscopic 15 years ago.  Pt evaluated by Ortho recommending TKA  Cortisone effective in past  Voltaren gel 4/day  Encouraged to follow up with PT for consideration of quad strengthening and TENS eval and mobility (referral  in place)  Will consider cortisone injections in future and possible Synvisc (tried many years ago)  Encouraged pt to follow up with pool therapy (again)  Will order shoe wedges on left, will consider right in future  Will order tramadol daily PRN (not to be with Doxepin, however, pt does not believe he is taking this medication anyway)   2. Diabetic neuropathy  Cont Gabapentin 600 TID  Adjust meds per PCP   3. Abnormality of gait  Cont single point cane for safety

## 2016-01-30 ENCOUNTER — Encounter: Payer: Self-pay | Admitting: Family Medicine

## 2016-01-30 NOTE — Telephone Encounter (Signed)
His glipizide was increased to two tablets because his sugars were running high in the mornings. Do you want him to continue taking two a day or go back to one?

## 2016-01-31 ENCOUNTER — Other Ambulatory Visit: Payer: Self-pay

## 2016-01-31 MED ORDER — GLIPIZIDE ER 5 MG PO TB24
5.0000 mg | ORAL_TABLET | Freq: Every day | ORAL | 0 refills | Status: DC
Start: 2016-01-31 — End: 2016-05-27

## 2016-02-20 ENCOUNTER — Ambulatory Visit: Payer: PPO | Admitting: Family Medicine

## 2016-02-21 ENCOUNTER — Encounter: Payer: PPO | Admitting: Physical Medicine & Rehabilitation

## 2016-03-02 ENCOUNTER — Encounter: Payer: Self-pay | Admitting: Family Medicine

## 2016-03-02 ENCOUNTER — Ambulatory Visit (INDEPENDENT_AMBULATORY_CARE_PROVIDER_SITE_OTHER): Payer: PPO | Admitting: Family Medicine

## 2016-03-02 VITALS — BP 118/80 | HR 66 | Resp 12 | Ht 65.0 in | Wt 180.5 lb

## 2016-03-02 DIAGNOSIS — Z23 Encounter for immunization: Secondary | ICD-10-CM

## 2016-03-02 DIAGNOSIS — I10 Essential (primary) hypertension: Secondary | ICD-10-CM | POA: Diagnosis not present

## 2016-03-02 DIAGNOSIS — G4700 Insomnia, unspecified: Secondary | ICD-10-CM

## 2016-03-02 DIAGNOSIS — E1149 Type 2 diabetes mellitus with other diabetic neurological complication: Secondary | ICD-10-CM

## 2016-03-02 DIAGNOSIS — M79622 Pain in left upper arm: Secondary | ICD-10-CM

## 2016-03-02 DIAGNOSIS — K529 Noninfective gastroenteritis and colitis, unspecified: Secondary | ICD-10-CM | POA: Diagnosis not present

## 2016-03-02 LAB — BASIC METABOLIC PANEL
BUN: 18 mg/dL (ref 6–23)
CALCIUM: 9.5 mg/dL (ref 8.4–10.5)
CO2: 28 mEq/L (ref 19–32)
CREATININE: 0.75 mg/dL (ref 0.40–1.50)
Chloride: 104 mEq/L (ref 96–112)
GFR: 105.96 mL/min (ref >60.00)
Glucose, Bld: 184 mg/dL — ABNORMAL HIGH (ref 70–99)
Potassium: 4.5 mEq/L (ref 3.5–5.1)
Sodium: 141 mEq/L (ref 135–145)

## 2016-03-02 LAB — MICROALBUMIN / CREATININE URINE RATIO
Creatinine,U: 130.9 mg/dL
MICROALB UR: 1 mg/dL (ref 0.0–1.9)
Microalb Creat Ratio: 0.8 mg/g (ref 0.0–30.0)

## 2016-03-02 LAB — HEMOGLOBIN A1C: HEMOGLOBIN A1C: 6.3 % (ref 4.6–6.5)

## 2016-03-02 NOTE — Progress Notes (Signed)
HPI:   [name] is a 82 y.o. male, who is here today with his wife to follow on some of his chronic medical problems.  Hx of muscle cramps, mainly on LE and feet but occasionally on upper extremities. He is on Gabapentin , which recently was increased from 300 mg am and 600 mg pm to 600 mg tid by Dr Posey Pronto , who he is seeing for chronic pain. He also has Hx of peripheral neuropathy, numbness and burning on feet and Gabapentin has helped. He is c/o increased appetite and wt gain, clothes feels tighter.  He mentions that for the past few weeks he has noted some cramps on left hand, feels like hand moves, twist involuntarily, rotation like for seconds. No MS changes or focal weakness. He has had similar symptoms when he has cramps on LE's.   About a year ago I recommended low-dose of Xanax trying to help with cramps and sleep, she stopped medication because he didn't like side effects and reported that it wasn't helping, still has prescription left and took half of a tablet a few days ago, he felt like medication helps with sleep this time. He is reporting mild anxiety, this is occasionally, he denies depressed mood or suicidal thoughts. In the past he has not tolerated Cymbalta.  -History of generalized OA, he is currently following up with Dr. Posey Pronto for chronic pain, currently he is on Tramadol and Voltaren gel; also planning on starting pool exercises. Next f/u appointment in December 2017.    Insomnia: He is currently on Doxepin 10 mg, which helps with sleep but he takes just a few times per week because feeling drowsy next morning. He is still drinking alcohol at bedtime, 1-2 glasses.  He denies any other side effect from medication. Trazodone did not help in the past.  History of chronic diarrhea, IBS-D: Currently he is on Lomotil, which overall has helped, deneis side effects. He has not noted blood in stool, melena, or abdominal pain.   Diabetes Mellitus II:    Currently on glipizide XL 5 mg daily. Metformin was discontinued about 10-12 months ago because chronic diarrhea.  Problem has been stable. Checking BS's : Yes,concerned because some FG between 150s and 160s, today 218. Hypoglycemia: Denies.  He is tolerating medications well. He denies abdominal pain, nausea, vomiting, polydipsia, polyuria, or polyphagia. + Numbness, tingling, and burning on LE, stable.   Lab Results  Component Value Date   HGBA1C 6.3 08/12/2015   Lab Results  Component Value Date   MICROALBUR 0.8 08/12/2015   Hypertension:  Currently on Toprol XL 25 mg daily and Diovan 320 mg daily.  He is taking medications as instructed, no side effects reported. He has not noted unusual headache, visual changes, exertional chest pain, dyspnea,  Or focal weakness. BP readings at home overall < 140/90 and HR high 50's to low 60's.Some SBP 150's,160's, and low 100's.  Metoprolol has been decreased due to bradycardia.   Lab Results  Component Value Date   CREATININE 0.80 10/16/2015   BUN 17 10/16/2015   NA 141 10/16/2015   K 4.2 10/16/2015   CL 103 10/16/2015   CO2 28 10/16/2015    Hx of CHF, diastolic dysfunction. He denies orthopnea, PND, or worsening LE edema. He is not weighting himself daily. He is on Furosemide, which he takes almost every day but skips when he has to go out for several hours.  Echo [id number]: Systolic function was normal.  Wall motion was normal; there were no regional wall motion abnormalities.Grade 1 diastolic dysfunction  Hx of OSA, he did not tolerate CPAP. + Fatigue, which is stable.  -He has history of BPH, still having urinary frequency and nocturia, he states that sometimes he is outdoors for hours and does not feel the need to avoid but once his in the house he feels like he needs to urinate more frequent. He is on Flomax, Cardura, Proscar. He has followed with urologist , last visit within the past year. He denies gross hematuria  or dysuria.     + HLD, he has not tolerated statins in the past.   Concerns today: 2-3 months of LUE feeling like "iron" heavy , tingling, intermittently, worse when he sleeps on right side. He states that it is mild and seems to be improving.  He has seen Dr Joya Salm, neurosurgeon, about 20 years ago and according to pt and wife, he was told eventually he might need surgery. He has not noted weakness, cold extremity, or cyanosis. No skin rash.    Review of Systems  Constitutional: Positive for fatigue. Negative for appetite change, diaphoresis, fever and unexpected weight change.  HENT: Negative for mouth sores, nosebleeds, sore throat and trouble swallowing.   Eyes: Negative for pain, redness and visual disturbance.  Respiratory: Positive for apnea (Hx of OSA, has not tolerated CPAP.). Negative for cough, shortness of breath and wheezing.   Cardiovascular: Negative for chest pain, palpitations and leg swelling.  Gastrointestinal: Negative for abdominal pain, blood in stool, nausea and vomiting.       No changes in bowel habits.  Endocrine: Negative for polydipsia, polyphagia and polyuria.  Genitourinary: Negative for decreased urine volume, dysuria and hematuria.  Musculoskeletal: Positive for arthralgias, back pain, myalgias and neck pain.  Skin: Negative for color change and rash.  Neurological: Positive for numbness. Negative for seizures, syncope, weakness and headaches.  Psychiatric/Behavioral: Positive for sleep disturbance. Negative for confusion and hallucinations.      Current Outpatient Prescriptions on File Prior to Visit  Medication Sig Dispense Refill  . albuterol (PROVENTIL HFA;VENTOLIN HFA) 108 (90 Base) MCG/ACT inhaler Inhale 2 puffs into the lungs every 6 (six) hours as needed for wheezing or shortness of breath. 1 Inhaler 0  . aspirin 81 MG EC tablet Take 81 mg by mouth daily.     . Cyanocobalamin (VITAMIN B 12 PO) Take by mouth. Reported on 08/12/2015    .  diclofenac sodium (VOLTAREN) 1 % GEL Apply 4 g topically 4 (four) times daily. 400 g 2  . diphenoxylate-atropine (LOMOTIL) 2.5-0.025 MG tablet Take 1 tablet by mouth 2 (two) times daily. 180 tablet 0  . doxazosin (CARDURA) 2 MG tablet Take 1 tablet (2 mg total) by mouth daily. 90 tablet 1  . doxepin (SINEQUAN) 10 MG capsule Take 1 capsule (10 mg total) by mouth at bedtime. 30 capsule 1  . finasteride (PROSCAR) 5 MG tablet Take 1 tablet (5 mg total) by mouth daily. 90 tablet 3  . furosemide (LASIX) 40 MG tablet Take 1 tab (40 mg total) by mouth daily. You may take 1 extra lasix in the evening if you have SOB, LEE, and weight gain > 3lbs in 24 hours 180 tablet 11  . gabapentin (NEURONTIN) 600 MG tablet Take 1 tablet (600 mg total) by mouth 3 (three) times daily. 90 tablet 0  . glipiZIDE (GLUCOTROL XL) 5 MG 24 hr tablet Take 1 tablet (5 mg total) by mouth daily with breakfast.  90 tablet 0  . Glucose Blood (FREESTYLE LITE TEST VI)     . metoprolol succinate (TOPROL-XL) 25 MG 24 hr tablet Take 1 tablet (25 mg total) by mouth daily. 90 tablet 1  . ONE TOUCH ULTRA TEST test strip USE TO CHECK BLOOD SUGAR DAILY AND PRN 100 each 4  . ONETOUCH DELICA LANCETS 99991111 MISC USE TO CHECK BLOOD SUGAR DAILY AND PRN 100 each 4  . tamsulosin (FLOMAX) 0.4 MG CAPS capsule Take 1 capsule (0.4 mg total) by mouth daily after supper. 90 capsule 2  . timolol (TIMOPTIC-XR) 0.5 % ophthalmic gel-forming     . traMADol (ULTRAM) 50 MG tablet Take 1 tablet (50 mg total) by mouth daily as needed. 30 tablet 0  . valsartan (DIOVAN) 320 MG tablet Take 1 tablet (320 mg total) by mouth daily. 90 tablet 3   No current facility-administered medications on file prior to visit.      Past Medical History:  Diagnosis Date  . BACK PAIN, CHRONIC 04/18/2009  . BENIGN PROSTATIC HYPERTROPHY, HX OF 01/19/2007  . Carpal tunnel syndrome 03/24/2007  . CORONARY ARTERY DISEASE 01/19/2007  . DEPRESSION, CHRONIC 06/24/2007  . DIABETES MELLITUS  01/19/2007  . DIABETES MELLITUS, TYPE II, WITH NEUROLOGICAL COMPLICATIONS 99991111  . Diarrhea 02/23/2007  . HEART MURMUR, SYSTOLIC [id number]  . HERPES SIMPLEX INFECTION 01/19/2007  . HYPERLIPIDEMIA 01/19/2007  . HYPERTENSION 01/19/2007  . Intermediate coronary syndrome ([place]) 08/29/2008  . LEG CRAMPS, NOCTURNAL 10/28/2007  . Leg cramps, sleep related 08/05/2010  . OBSTRUCTIVE SLEEP APNEA 01/19/2007   does not use CPAP  . OSTEOARTHRITIS 06/24/2007  . Other postprocedural status(V45.89) 01/19/2007  . PERCUTANEOUS TRANSLUMINAL CORONARY ANGIOPLASTY, HX OF 01/19/2007  . PLANTAR FASCIITIS, RIGHT 01/19/2007  . SHOULDER PAIN, LEFT 03/24/2007  . SPINAL STENOSIS, LUMBAR 06/03/2009  . TINEA PEDIS 11/15/2009  . URI 01/11/2009   No Known Allergies  Social History   Social History  . Marital status: Married    Spouse name: N/A  . Number of children: N/A  . Years of education: N/A   Occupational History  . truck Educational psychologist for [employer]  . Smoking status: Former Smoker    Quit date: 04/14/1983  . Smokeless tobacco: Never Used     Comment: stopped 1985  . Alcohol use Yes     Comment: 2-3 times a week - wine, scotch  . Drug use: No  . Sexual activity: Not Asked   Other Topics Concern  . None   Social History Narrative   Married in 1959 - 65yrs, divorced; married 1974 - 1.5 years, divorced; married 1985 1 daughter 97; 19 step daughters; 2 grandchildren.    Vitals:   03/02/16 1022  BP: 118/80  Pulse: 66  Resp: 12   Body mass index is 30.04 kg/m.  O2 sat at RA 96%  Wt Readings from Last 3 Encounters:  03/02/16 180 lb 8 oz (81.9 kg)  12/10/15 178 lb (80.7 kg)  11/05/15 174 lb (78.9 kg)       Physical Exam  Nursing note and vitals reviewed. Constitutional: He is oriented to person, place, and time. He appears well-developed. No distress.  HENT:  Head: Atraumatic.  Mouth/Throat: Oropharynx is clear and moist and mucous membranes are  normal.  Eyes: Conjunctivae and EOM are normal. Pupils are equal, round, and reactive to light.  Neck: No JVD present.  Cardiovascular: Normal rate and regular rhythm.  Exam reveals no gallop.  Murmur (SEM II/VI base) heard. Respiratory: Effort normal and breath sounds normal. No respiratory distress.  GI: Soft. He exhibits no mass. There is no hepatomegaly. There is no tenderness.  Musculoskeletal: He exhibits edema (1+ pitting edema LE bilateral.). He exhibits no tenderness.  Neurological: He is alert and oriented to person, place, and time. He has normal strength. Coordination normal.  Stable gait, no assistance today.  Skin: Skin is warm. No erythema.  Psychiatric: He has a normal mood and affect. His speech is normal.  Well groomed,good eye contact.   Diabetic foot exam:  Monofilament abnormal bilateral. Peripheral pulses present (DP). No calluses No hypertrophic/long toenails.   ASSESSMENT AND PLAN:     [name] was seen today for shoulder pain.  Diagnoses and all orders for this visit:  Lab Results  Component Value Date   HGBA1C 6.3 03/02/2016   Lab Results  Component Value Date   CREATININE 0.75 03/02/2016   BUN 18 03/02/2016   NA 141 03/02/2016   K 4.5 03/02/2016   CL 104 03/02/2016   CO2 28 03/02/2016   Lab Results  Component Value Date   MICROALBUR 1.0 03/02/2016     Diabetes mellitus type 2 with neurological manifestations ([place])  HgA1C has been at goal, today lab pending. No changes in current management for now, may need to adjust treatment according to lab results, Goal < 8.0. Regular exercise as tolerated and healthy diet with avoidance of added sugar food intake is an important part of treatment and recommended. Alcohol also has calories and sugar and could be the cause of elevated FG ,I explained. Caution with hypoglycemia. Periodic eye exam,denta,l and foot care recommended. F/U in 5-6 months  -     Hemoglobin A1c -     Urine Microalbumin  w/creat. ratio -     Fructosamine  Chronic diarrhea  Improved. No changes in current management.  F/U in 4 months.   Insomnia, unspecified type  Doxepin helped. He can continue as needed, when due for refill we could consider trying lower dose. Good sleep hygiene. F/U in 4 months.  Essential hypertension  Adequately controlled. No changes in current management. DASH-low salt diet recommended. Eye exam recommended annually. F/U in 4 months, before if needed.  -     Basic Metabolic Panel -     Urine Microalbumin w/creat. ratio  Left upper arm pain  ? Radicular pain. He reports prior Hx, he is not interested in imaging or referral to Dr Joya Salm, neurosurgeon. He is not interested in invasive procedures. Prednisone may help, after discussion of side effects he prefers to hold on treatment, feels like it is mild. Clearly instructed about warning signs. Continue Gabapentin, he could decrease dose from 600 mg tid to bid and monitor for changes in arm discomfort and/or cramps.  Recommend following with Dr Posey Pronto.  Need for immunization against influenza -     Flu vaccine HIGH DOSE PF        -[name] was advised to return sooner than planned today if new concerns arise.       [name]. [name], MD  [facility] [place] office.

## 2016-03-02 NOTE — Progress Notes (Signed)
Pre visit review using our clinic review tool, if applicable. No additional management support is needed unless otherwise documented below in the visit note. 

## 2016-03-02 NOTE — Patient Instructions (Addendum)
A few things to remember from today's visit:   Diabetes mellitus type 2 with neurological manifestations ([place]) - Plan: Hemoglobin A1c, Urine Microalbumin w/creat. ratio  Essential hypertension - Plan: Basic Metabolic Panel, Urine Microalbumin w/creat. ratio  Chronic diarrhea  Insomnia, unspecified type  Need for immunization against influenza - Plan: Flu vaccine HIGH DOSE PF  Left upper arm pain  Take Doxepin at bedtime more often. No changes in medications. Fall precautions.  Left arm pain could be related with pinched nerve in neck.   Please be sure medication list is accurate. If a new problem present, please set up appointment sooner than planned today.

## 2016-03-03 DIAGNOSIS — E1149 Type 2 diabetes mellitus with other diabetic neurological complication: Secondary | ICD-10-CM | POA: Diagnosis not present

## 2016-03-04 ENCOUNTER — Encounter: Payer: Self-pay | Admitting: Family Medicine

## 2016-03-06 ENCOUNTER — Encounter: Payer: Self-pay | Admitting: Family Medicine

## 2016-03-06 LAB — FRUCTOSAMINE: Fructosamine: 275 umol/L — ABNORMAL HIGH (ref 190–270)

## 2016-03-11 ENCOUNTER — Encounter: Payer: Self-pay | Admitting: Family Medicine

## 2016-03-12 ENCOUNTER — Other Ambulatory Visit: Payer: Self-pay

## 2016-03-12 MED ORDER — FINASTERIDE 5 MG PO TABS: 5.0000 mg | ORAL_TABLET | Freq: Every day | ORAL | 1 refills | Status: DC

## 2016-03-12 MED ORDER — FUROSEMIDE 40 MG PO TABS: ORAL_TABLET | ORAL | 1 refills | Status: DC

## 2016-03-20 ENCOUNTER — Other Ambulatory Visit: Payer: Self-pay | Admitting: Physical Medicine & Rehabilitation

## 2016-03-20 ENCOUNTER — Other Ambulatory Visit: Payer: Self-pay | Admitting: Family Medicine

## 2016-03-20 DIAGNOSIS — K529 Noninfective gastroenteritis and colitis, unspecified: Secondary | ICD-10-CM

## 2016-03-23 MED ORDER — DIPHENOXYLATE-ATROPINE 2.5-0.025 MG PO TABS: 1.0000 | ORAL_TABLET | Freq: Two times a day (BID) | ORAL | 1 refills | Status: DC | PRN

## 2016-03-23 NOTE — Telephone Encounter (Signed)
Okay to refill? 

## 2016-03-23 NOTE — Telephone Encounter (Signed)
Rx faxed

## 2016-03-23 NOTE — Telephone Encounter (Signed)
Rx for Lomotil 2.5-0.025 mg can be called in to continue 1 tab tid as needed for diarrhea. #180/1.  I am sending Gabapentin to his pharmacy. Thanks, BJ

## 2016-03-24 MED ORDER — GABAPENTIN 600 MG PO TABS: 600.0000 mg | ORAL_TABLET | Freq: Three times a day (TID) | ORAL | 0 refills | Status: DC

## 2016-03-25 ENCOUNTER — Encounter: Payer: Self-pay | Admitting: Physical Medicine & Rehabilitation

## 2016-03-25 ENCOUNTER — Encounter: Payer: PPO | Attending: Physical Medicine & Rehabilitation | Admitting: Physical Medicine & Rehabilitation

## 2016-03-25 VITALS — BP 108/51 | HR 60 | Resp 14

## 2016-03-25 DIAGNOSIS — M170 Bilateral primary osteoarthritis of knee: Secondary | ICD-10-CM | POA: Diagnosis not present

## 2016-03-25 DIAGNOSIS — E1140 Type 2 diabetes mellitus with diabetic neuropathy, unspecified: Secondary | ICD-10-CM | POA: Insufficient documentation

## 2016-03-25 DIAGNOSIS — G5600 Carpal tunnel syndrome, unspecified upper limb: Secondary | ICD-10-CM | POA: Insufficient documentation

## 2016-03-25 DIAGNOSIS — Z87891 Personal history of nicotine dependence: Secondary | ICD-10-CM | POA: Diagnosis not present

## 2016-03-25 DIAGNOSIS — J449 Chronic obstructive pulmonary disease, unspecified: Secondary | ICD-10-CM | POA: Diagnosis not present

## 2016-03-25 DIAGNOSIS — M25561 Pain in right knee: Secondary | ICD-10-CM

## 2016-03-25 DIAGNOSIS — F329 Major depressive disorder, single episode, unspecified: Secondary | ICD-10-CM | POA: Diagnosis not present

## 2016-03-25 DIAGNOSIS — M48061 Spinal stenosis, lumbar region without neurogenic claudication: Secondary | ICD-10-CM | POA: Diagnosis not present

## 2016-03-25 DIAGNOSIS — R269 Unspecified abnormalities of gait and mobility: Secondary | ICD-10-CM | POA: Diagnosis not present

## 2016-03-25 DIAGNOSIS — E1142 Type 2 diabetes mellitus with diabetic polyneuropathy: Secondary | ICD-10-CM | POA: Diagnosis not present

## 2016-03-25 DIAGNOSIS — I2510 Atherosclerotic heart disease of native coronary artery without angina pectoris: Secondary | ICD-10-CM | POA: Diagnosis not present

## 2016-03-25 DIAGNOSIS — G8929 Other chronic pain: Secondary | ICD-10-CM

## 2016-03-25 DIAGNOSIS — Z9889 Other specified postprocedural states: Secondary | ICD-10-CM | POA: Diagnosis not present

## 2016-03-25 DIAGNOSIS — I509 Heart failure, unspecified: Secondary | ICD-10-CM | POA: Insufficient documentation

## 2016-03-25 DIAGNOSIS — M159 Polyosteoarthritis, unspecified: Secondary | ICD-10-CM

## 2016-03-25 DIAGNOSIS — M25562 Pain in left knee: Secondary | ICD-10-CM

## 2016-03-25 MED ORDER — DICLOFENAC SODIUM 1 % TD GEL: 4.0000 g | Freq: Four times a day (QID) | TRANSDERMAL | 2 refills | Status: DC

## 2016-03-25 MED ORDER — METHOCARBAMOL 500 MG PO TABS: 500.0000 mg | ORAL_TABLET | Freq: Three times a day (TID) | ORAL | 1 refills | Status: DC | PRN

## 2016-03-25 MED ORDER — GABAPENTIN 300 MG PO CAPS: 900.0000 mg | ORAL_CAPSULE | Freq: Three times a day (TID) | ORAL | 1 refills | Status: DC

## 2016-03-25 MED ORDER — TRAMADOL HCL 50 MG PO TABS: 100.0000 mg | ORAL_TABLET | Freq: Two times a day (BID) | ORAL | 0 refills | Status: DC | PRN

## 2016-03-25 NOTE — Progress Notes (Addendum)
Subjective:    Patient ID: [name], male    DOB: [birth year], 82 y.o.   MRN: [id number]  HPI  82 y/o with pmh/psh of of generalized OA, spinal stenosis, DM with neuropathy, CTS, COPD, CAD, plantar fascitis, lumbar fusion presents for follow up of b/l knee pain.  Pain anterior knee.  Pain started ~2000.  Denies inciting events.  Getting progressively worse.  Resting improves the pain.  Walking, standing exacerbates the pain.  Achy pain. Non-radiating.  Denies associated numbness, tingling, weakness.  Pt cortisone shots, which helped, last one was 1 year ago.  Denies falls. Pain limits from dancing,standing for long periods, and walking long distances.    Last clinic visit 01/29/16.  Pt notes benefit with Voltaren gel.  Pt states he never received a call from PT.  Pt has not been to pool therapy.  He only heard from prosthetists yesterday, and is scheduled to go next week for fitting.  Pt does not note any benefit from tramadol. Pt continues to use cane for ambulation. Today, he notes cramping in his foot and shoulder pain at night.    Pain Inventory Average Pain 7 Pain Right Now 7 My pain is dull  In the last 24 hours, has pain interfered with the following? General activity 7 Relation with others 7 Enjoyment of life 7 What TIME of day is your pain at its worst? no selection Sleep (in general) Poor  Pain is worse with: walking, bending and standing Pain improves with: rest and injections Relief from Meds: 6  Mobility walk without assistance walk with assistance use a cane use a walker ability to climb steps?  yes do you drive?  yes  Function retired  Neuro/Psych bladder control problems bowel control problems weakness trouble walking  Prior Studies Any changes since last visit?  no  Physicians involved in your care Any changes since last visit?  no Primary care [name], MD   Family History  Problem Relation Age of Onset  . Diabetes Other   . Coronary  artery disease Other    Social History   Social History  . Marital status: Married    Spouse name: N/A  . Number of children: N/A  . Years of education: N/A   Occupational History  . 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 Smoking status: Former Smoker    Quit date: 04/14/1983  . Smokeless tobacco: Never Used     Comment: stopped 1985  . Alcohol use Yes     Comment: 2-3 times a week - wine, scotch  . Drug use: No  . Sexual activity: Not Asked   Other Topics Concern  . None   Social History Narrative   Married in 1959 - 59yrs, divorced; married 1974 - 1.5 years, divorced; married 1985 1 daughter 55; 24 step daughters; 2 grandchildren.   Past Surgical History:  Procedure Laterality Date  . arthroscopy, knee of hx    . CARDIAC CATHETERIZATION    . COLONOSCOPY    . inguinal herniorrhapy, hx    . NASAL SINUS SURGERY    . percutaneous transluminal coronary angioplasty, hx of  2004   Dr. Lyndel Safe  . plastic joint in thumb    . PTCA/stent  2004  . TONSILLECTOMY    . VASECTOMY     Past Medical History:  Diagnosis Date  . BACK PAIN, CHRONIC 04/18/2009  . BENIGN PROSTATIC HYPERTROPHY, HX OF 01/19/2007  .  Carpal tunnel syndrome 03/24/2007  . CORONARY ARTERY DISEASE 01/19/2007  . DEPRESSION, CHRONIC 06/24/2007  . DIABETES MELLITUS 01/19/2007  . DIABETES MELLITUS, TYPE II, WITH NEUROLOGICAL COMPLICATIONS 99991111  . Diarrhea 02/23/2007  . HEART MURMUR, SYSTOLIC [id number]  . HERPES SIMPLEX INFECTION 01/19/2007  . HYPERLIPIDEMIA 01/19/2007  . HYPERTENSION 01/19/2007  . Intermediate coronary syndrome ([place]) 08/29/2008  . LEG CRAMPS, NOCTURNAL 10/28/2007  . Leg cramps, sleep related 08/05/2010  . OBSTRUCTIVE SLEEP APNEA 01/19/2007   does not use CPAP  . OSTEOARTHRITIS 06/24/2007  . Other postprocedural status(V45.89) 01/19/2007  . PERCUTANEOUS TRANSLUMINAL CORONARY ANGIOPLASTY, HX OF 01/19/2007  . PLANTAR FASCIITIS, RIGHT 01/19/2007  . SHOULDER PAIN,  LEFT 03/24/2007  . SPINAL STENOSIS, LUMBAR 06/03/2009  . TINEA PEDIS 11/15/2009  . URI 01/11/2009   BP (!) 108/51   Pulse 60   Resp 14   SpO2 95%   Opioid Risk Score:   Fall Risk Score:  `1  Depression screen PHQ 2/9  Depression screen PHQ 2/9 01/01/2016  Decreased Interest 2  Down, Depressed, Hopeless 1  PHQ - 2 Score 3  Altered sleeping 2  Tired, decreased energy 3  Change in appetite 0  Feeling bad or failure about yourself  0  Trouble concentrating 2  Moving slowly or fidgety/restless 0  Suicidal thoughts 0  PHQ-9 Score 10  Difficult doing work/chores Somewhat difficult    Review of Systems  HENT: Negative.   Eyes: Negative.   Respiratory: Negative.   Cardiovascular: Negative.   Gastrointestinal: Positive for diarrhea.  Endocrine: Negative.        High blood sugar  Genitourinary: Positive for difficulty urinating.  Musculoskeletal: Positive for arthralgias and gait problem.  Allergic/Immunologic: Negative.   Neurological: Positive for weakness.  All other systems reviewed and are negative.     Objective:   Physical Exam Gen: NAD. Vital signs reviewed HENT: Normocephalic, Atraumatic Eyes: EOMI. No discharge.   Cardio: RRR. No JVD.  Pulm: B/l clear to auscultation.  Effort normal Abd: Soft, non-distended, non-tender, BS+ MSK:  Gait slightly antalgic.   Mild TTP proximal medial knee.    No edema.   PROM WNL Neuro:  Sensation intact to light touch in all LE dermatomes  Strength  5/5 in all LE myotomes Skin: Warm and Dry    Assessment & Plan:  82 y/o with pmh/psh of of generalized OA, spinal stenosis, DM with neuropathy, CTS, COPD, CHF, CAD, plantar fascitis, lumbar fusion presents for follow up b/l knee pain.  1. OA of knees  Xrays reviewed, showing bilateral medial compartment end-stage disease  NSAIDs not appropriate due to CAD  S/p Arthroscopic 15 years ago.  Pt evaluated by Ortho recommending TKA  Cortisone effective in past  Cont Voltaren gel  4/day  Encouraged to follow up with PT for consideration of quad strengthening and TENS eval and mobility again (referral in place)  Will consider cortisone injections in future and possible Synvisc (tried many years ago)  Encouraged pt to follow up with pool therapy (againx2)  Pt awaiting shoe wedges on left, will consider right in future  Will increase tramadol to 100 daily BID PRN  Will order Robaxin 500 TID PRN  Will consider Cymbalta in future   2. Diabetic neuropathy  Will increase Gabapentin 900 TID   3. Abnormality of gait  Cont single point cane for safety  4. Generalized OA  See #1

## 2016-03-31 DIAGNOSIS — M25561 Pain in right knee: Secondary | ICD-10-CM | POA: Diagnosis not present

## 2016-03-31 DIAGNOSIS — M25562 Pain in left knee: Secondary | ICD-10-CM | POA: Diagnosis not present

## 2016-03-31 DIAGNOSIS — M170 Bilateral primary osteoarthritis of knee: Secondary | ICD-10-CM | POA: Diagnosis not present

## 2016-03-31 DIAGNOSIS — R269 Unspecified abnormalities of gait and mobility: Secondary | ICD-10-CM | POA: Diagnosis not present

## 2016-04-20 ENCOUNTER — Encounter: Payer: Self-pay | Admitting: Family Medicine

## 2016-04-20 NOTE — Telephone Encounter (Signed)
Okay to refill? 

## 2016-04-24 ENCOUNTER — Telehealth: Payer: Self-pay | Admitting: *Deleted

## 2016-04-24 MED ORDER — ALPRAZOLAM 0.5 MG PO TABS: ORAL_TABLET | ORAL | 0 refills | Status: DC

## 2016-04-24 NOTE — Telephone Encounter (Signed)
Spoke to pt, told him Dr.Jordan said he can take Xanax 0.5 mg 1/2 - 1 tablet at bedtime prn. Asked pt where he would like Rx called into? Pt said [facility]. Told pt okay will call Rx in. Pt verbalized understanding.

## 2016-05-07 ENCOUNTER — Ambulatory Visit: Payer: PPO | Admitting: Physical Medicine & Rehabilitation

## 2016-05-20 DIAGNOSIS — H401191 Primary open-angle glaucoma, unspecified eye, mild stage: Secondary | ICD-10-CM | POA: Diagnosis not present

## 2016-05-20 DIAGNOSIS — H401131 Primary open-angle glaucoma, bilateral, mild stage: Secondary | ICD-10-CM | POA: Diagnosis not present

## 2016-05-20 DIAGNOSIS — H25811 Combined forms of age-related cataract, right eye: Secondary | ICD-10-CM | POA: Diagnosis not present

## 2016-05-20 DIAGNOSIS — E119 Type 2 diabetes mellitus without complications: Secondary | ICD-10-CM | POA: Diagnosis not present

## 2016-05-20 DIAGNOSIS — Z7984 Long term (current) use of oral hypoglycemic drugs: Secondary | ICD-10-CM | POA: Diagnosis not present

## 2016-05-20 DIAGNOSIS — H52223 Regular astigmatism, bilateral: Secondary | ICD-10-CM | POA: Diagnosis not present

## 2016-05-20 DIAGNOSIS — Z961 Presence of intraocular lens: Secondary | ICD-10-CM | POA: Diagnosis not present

## 2016-05-27 ENCOUNTER — Other Ambulatory Visit: Payer: Self-pay | Admitting: Family Medicine

## 2016-05-28 MED ORDER — GLIPIZIDE ER 5 MG PO TB24: 5.0000 mg | ORAL_TABLET | Freq: Every day | ORAL | 1 refills | Status: DC

## 2016-06-13 ENCOUNTER — Encounter: Payer: Self-pay | Admitting: Family Medicine

## 2016-06-15 ENCOUNTER — Encounter: Payer: Self-pay | Admitting: Physical Medicine & Rehabilitation

## 2016-06-15 ENCOUNTER — Other Ambulatory Visit: Payer: Self-pay

## 2016-06-15 MED ORDER — DOXAZOSIN MESYLATE 2 MG PO TABS: 2.0000 mg | ORAL_TABLET | Freq: Every day | ORAL | 2 refills | Status: DC

## 2016-06-16 ENCOUNTER — Telehealth: Payer: Self-pay | Admitting: *Deleted

## 2016-06-16 NOTE — Telephone Encounter (Signed)
Patient left a message asking for [place] to call back.  This may pertain to the e-mail he sent as well.

## 2016-06-16 NOTE — Telephone Encounter (Signed)
Dr Posey Pronto has answered his email. Because of the facility fee and his copay he is no longer able to justify coming to our office.

## 2016-06-17 ENCOUNTER — Telehealth: Payer: Self-pay | Admitting: Family Medicine

## 2016-06-17 NOTE — Telephone Encounter (Signed)
° ° ° °  Pt call to say he is having blurr visions. Questions is do he need to see opthamologist or fup with pcp. He has an appt on 07/01/16

## 2016-06-18 NOTE — Telephone Encounter (Signed)
Called and spoke with patient. Advised that he should try to see his eye doctor. Patient verbalized understanding and asked for his appointment on 3/21 to be canceled. Appointment has been canceled.

## 2016-06-18 NOTE — Telephone Encounter (Signed)
He can see eye care provider, ophthalmologists or optometrist are appropriate. Ideally who has sooner availability. I believe he is already established with ophthalmologists.  Thanks, BJ

## 2016-07-01 ENCOUNTER — Ambulatory Visit: Payer: PPO | Admitting: Family Medicine

## 2016-07-02 NOTE — Progress Notes (Signed)
HPI:   [name] is a 83 y.o. male, who is here today with his wife to follow on some chronic medical problems.  Hx of chronic pain, generalized OA. He was following with Dr Posey Pronto but he could not afford co pay and bills he received after OV's. According to pt, he was receiving bills from hospital because room used for his visits "treatment room" was not covered by his insurance. He was on Tramadol 100 mg bid, he is not sure if this was helping. He would like for me to resume knee steroid  injections, which he felt like were helping for 2-3 months. He has not re-schedule appt with ortho,arthroplasty was recommended. He did not re-schedule appt with [facility] either.   Hypertension:  Currently on Diovan 320 mg,Metoprolol Succinate 25 mg daily, and Cardura 2 mg at bedtime.  He is not checking BP lately.  He is taking medications as instructed, no side effects reported.  He has not noted unusual headache, visual changes, exertional chest pain, dyspnea,  focal weakness, or worsen ing edema.  + CAD and diastolic CHF: On Furosemide 40 mg daily. He denies orthopnea or PND.   Diabetes Mellitus II:   Currently on Glipizide XL 5 mg daily. Problem has been well controlled. Checking BS's : Some 200-300 x1, BS's seem to be higher when he is in severe pain. Hypoglycemia:Denies  He is tolerating medications well. He denies abdominal pain, nausea, vomiting, polydipsia, polyuria, or polyphagia. Hx of peripheral neuropathy, stable tingling/burning sensation on LE's.  He is on Gabapentin 300 mg am,noon,and 600 mg at bedtime.   Lab Results  Component Value Date   CREATININE 0.75 03/02/2016   BUN 18 03/02/2016   NA 141 03/02/2016   K 4.5 03/02/2016   CL 104 03/02/2016   CO2 28 03/02/2016    Lab Results  Component Value Date   HGBA1C 6.3 03/02/2016   Lab Results  Component Value Date   MICROALBUR 1.0 03/02/2016    Insomnia:  He is taking Xanax 0.5 mg daily ,  takes 1/2 tab and ab;e to sleep better,about 6 hours. He takes naps during the day sometimes.  Muscle cramps also affect his sleep,seem to be better,not as frequent as he was having them. He has tried Trazodone and Doxepin in the past but has not helped.   Chronic diarrhea: In general problem is better since he has been on Lomotil bid, which he is taking daily. Sometimes he also takes Imodium OTC. Stools per day: 1- 4. He denies blood in stool. Cramping sensation prior to defecation but resolves after. Last colonoscopy 2011 otherrwise normal,3 "diminutive" polys, < 5 mm.   Rash on legs: Today He is also c/o non pruritic rash on LE's, erythematous,and tender. Rash noted about 1-2 months ago, not sure about exacerbating factors. OTC Hydrocortisone seemed to help,no new lesions. He has not had similar rash in the past. He applies Voltaren gel on knees,not sure if this is causing rash, this is not a new medication.  Denies associated fever,chills,or worsening arthralgias or LE cramps.  Review of Systems  Constitutional: Positive for fatigue (no more than usual). Negative for activity change, appetite change, fever and unexpected weight change.  HENT: Negative for mouth sores, nosebleeds, sore throat and trouble swallowing.   Eyes: Negative for pain, redness and visual disturbance.  Respiratory: Negative for cough, shortness of breath and wheezing.        Hx of OSA,did not tolerate CPAP.  Cardiovascular: Positive for leg swelling (chronic). Negative for chest pain and palpitations.  Gastrointestinal: Positive for abdominal pain (occasional cramps) and diarrhea. Negative for blood in stool, nausea and vomiting.  Endocrine: Negative for polydipsia, polyphagia and polyuria.  Genitourinary: Negative for decreased urine volume and hematuria.  Musculoskeletal: Positive for arthralgias, gait problem and myalgias.  Skin: Positive for rash. Negative for wound.  Neurological: Negative for syncope,  weakness and headaches.  Psychiatric/Behavioral: Positive for sleep disturbance. Negative for confusion and suicidal ideas. The patient is nervous/anxious.       Current Outpatient Prescriptions on File Prior to Visit  Medication Sig Dispense Refill  . albuterol (PROVENTIL HFA;VENTOLIN HFA) 108 (90 Base) MCG/ACT inhaler Inhale 2 puffs into the lungs every 6 (six) hours as needed for wheezing or shortness of breath. 1 Inhaler 0  . ALPRAZolam (XANAX) 0.5 MG tablet TAKE 1/2 - 1 TABLET BY MOUTH AT BEDTIME AS NEEDED 30 tablet 0  . aspirin 81 MG EC tablet Take 81 mg by mouth daily.     . diclofenac sodium (VOLTAREN) 1 % GEL Apply 4 g topically 4 (four) times daily. 400 g 2  . diphenoxylate-atropine (LOMOTIL) 2.5-0.025 MG tablet Take 1 tablet by mouth 2 (two) times daily as needed for diarrhea or loose stools. 180 tablet 1  . doxazosin (CARDURA) 2 MG tablet Take 1 tablet (2 mg total) by mouth daily. 90 tablet 2  . finasteride (PROSCAR) 5 MG tablet Take 1 tablet (5 mg total) by mouth daily. 90 tablet 1  . furosemide (LASIX) 40 MG tablet Take 1 tab (40 mg total) by mouth daily. You may take 1 extra lasix in the evening if you have SOB, LEE, and weight gain > 3lbs in 24 hours 180 tablet 1  . glipiZIDE (GLUCOTROL XL) 5 MG 24 hr tablet Take 1 tablet (5 mg total) by mouth daily with breakfast. 90 tablet 1  . Glucose Blood (FREESTYLE LITE TEST VI)     . methocarbamol (ROBAXIN) 500 MG tablet Take 1 tablet (500 mg total) by mouth 3 (three) times daily as needed for muscle spasms. 90 tablet 1  . metoprolol succinate (TOPROL-XL) 25 MG 24 hr tablet Take 1 tablet (25 mg total) by mouth daily. 90 tablet 1  . ONE TOUCH ULTRA TEST test strip USE TO CHECK BLOOD SUGAR DAILY AND PRN 100 each 4  . ONETOUCH DELICA LANCETS 10U MISC USE TO CHECK BLOOD SUGAR DAILY AND PRN 100 each 4  . tamsulosin (FLOMAX) 0.4 MG CAPS capsule Take 1 capsule (0.4 mg total) by mouth daily after supper. 90 capsule 2  . timolol (TIMOPTIC-XR) 0.5  % ophthalmic gel-forming     . traMADol (ULTRAM) 50 MG tablet Take 2 tablets (100 mg total) by mouth 2 (two) times daily as needed. 120 tablet 0  . valsartan (DIOVAN) 320 MG tablet Take 1 tablet (320 mg total) by mouth daily. 90 tablet 3  . gabapentin (NEURONTIN) 300 MG capsule Take 3 capsules (900 mg total) by mouth 3 (three) times daily. 270 capsule 1   No current facility-administered medications on file prior to visit.      Past Medical History:  Diagnosis Date  . BACK PAIN, CHRONIC 04/18/2009  . BENIGN PROSTATIC HYPERTROPHY, HX OF 01/19/2007  . Carpal tunnel syndrome 03/24/2007  . CORONARY ARTERY DISEASE 01/19/2007  . DEPRESSION, CHRONIC 06/24/2007  . DIABETES MELLITUS 01/19/2007  . DIABETES MELLITUS, TYPE II, WITH NEUROLOGICAL COMPLICATIONS 10/12/5364  . Diarrhea 02/23/2007  . HEART MURMUR, SYSTOLIC 4/40/3474  .  HERPES SIMPLEX INFECTION 01/19/2007  . HYPERLIPIDEMIA 01/19/2007  . HYPERTENSION 01/19/2007  . Intermediate coronary syndrome ([place]) 08/29/2008  . LEG CRAMPS, NOCTURNAL 10/28/2007  . Leg cramps, sleep related 08/05/2010  . OBSTRUCTIVE SLEEP APNEA 01/19/2007   does not use CPAP  . OSTEOARTHRITIS 06/24/2007  . Other postprocedural status(V45.89) 01/19/2007  . PERCUTANEOUS TRANSLUMINAL CORONARY ANGIOPLASTY, HX OF 01/19/2007  . PLANTAR FASCIITIS, RIGHT 01/19/2007  . SHOULDER PAIN, LEFT 03/24/2007  . SPINAL STENOSIS, LUMBAR 06/03/2009  . TINEA PEDIS 11/15/2009  . URI 01/11/2009   No Known Allergies  Social History   Social History  . Marital status: Married    Spouse name: N/A  . Number of children: N/A  . Years of education: N/A   Occupational History  . truck Educational psychologist for [employer]  . Smoking status: Former Smoker    Quit date: 04/14/1983  . Smokeless tobacco: Never Used     Comment: stopped 1985  . Alcohol use Yes     Comment: 2-3 times a week - wine, scotch  . Drug use: No  . Sexual activity: Not Asked   Other Topics Concern    . None   Social History Narrative   Married in 1959 - 20yr, divorced; married 1974 - 1.5 years, divorced; married 1985 1 daughter 166 161step daughters; 2 grandchildren.    Vitals:   07/03/16 1034  BP: 130/80  Pulse: 64  Resp: 12  O2 sat 94% at RA. Body mass index is 30.2 kg/m.  Physical Exam  Nursing note and vitals reviewed. Constitutional: He is oriented to person, place, and time. He appears well-developed. No distress.  HENT:  Head: Atraumatic.  Mouth/Throat: Oropharynx is clear and moist and mucous membranes are normal.  Eyes: Conjunctivae and EOM are normal. Pupils are equal, round, and reactive to light.  Cardiovascular: Normal rate and regular rhythm.  Exam reveals no gallop.   Murmur (SEM II/VI base) heard. Pulses:      Dorsalis pedis pulses are 2+ on the right side, and 2+ on the left side.  Respiratory: Effort normal and breath sounds normal. No respiratory distress.  GI: Soft. He exhibits no mass. There is no hepatomegaly. There is no tenderness.  Musculoskeletal: He exhibits edema (1+ pitting edema LE bilateral.).  Knee crepitus bilateral, R>L,limitation of flexion L>R. Pain elicited with ROM right knee.   Lymphadenopathy:    He has no cervical adenopathy.  Neurological: He is alert and oriented to person, place, and time. He has normal strength. Coordination normal.  Stable gait, assisted with cane  Skin: Skin is warm. Rash noted. No ecchymosis noted. Rash is maculopapular. Rash is not vesicular. No erythema.  LE, mainly distal ,medial calves: Scattered erythematous annular pattern rash,clear center,mild scaly,no tender with palpation, no induration and no local heat.   Psychiatric: He has a normal mood and affect. Cognition and memory are normal.  Well groomed,good eye contact.   Foot exam: + Callus, no wound, good capillary refill. Monofilament decreased bilateral.   ASSESSMENT AND PLAN:   TElgiewas seen today for follow-up.  Diagnoses and  all orders for this visit:  Lab Results  Component Value Date   HGBA1C 6.9 (H) 07/03/2016    Diabetes mellitus type 2 with neurological manifestations ([place]   HgA1C has been at goal. No changes in current management. HgA1C pending, will adjust medication accordingly. Regular physical activity as tolerated and healthy diet with avoidance of added sugar food intake  is an important part of treatment and recommended. Annual eye exam, periodic dental and foot care recommended. F/U in 5-6 months  -     Hemoglobin A1c -     Microalbumin / creatinine urine ratio -     Comprehensive metabolic panel  Hypertension with heart disease  Adequately controlled. No changes in current management. DASH, low salt diet recommended.. F/U in 6 months, before if needed.  -     Comprehensive metabolic panel  Insomnia, unspecified type  Good sleep hygiene. We discussed some side effects of benzo meds. No changes in current management for now. Also Gabapentin 600 mg at bedtime to continue. F/U in 3 months.  Chronic diarrhea  Improved. He can take Lomotil tid as needed, avoid OTC Imodium,side effects discussed. Adequate hydration. We discussed last colonoscopy report. Instructed about warning signs. F/U in 3 months.  Rash and nonspecific skin eruption  Possible causes discussed. ? Vasculitis,med reaction,fungal among some to consider. Continue OTC topical steroid,Hydrocortisone 1% bid.  -     Sed Rate (ESR) -     C-reactive protein -     CBC with Differential/Platelet  Generalized osteoarthritis of multiple sites  I do not have Kenalog available today,order was placed. We can try another injection and see how he does.We discussed some side effects and risks of procedure.   For now we will continue Voltaren gel, if rash persists I may ask him to hold on it for a few weeks. Fall precautions. Re-schedule appt with ortho.     -Mr. BRODRICK CURRAN was advised to return sooner than  planned today if new concerns arise.       [name]. [name], MD  [facility]. [place] office.

## 2016-07-03 ENCOUNTER — Ambulatory Visit (INDEPENDENT_AMBULATORY_CARE_PROVIDER_SITE_OTHER): Payer: PPO | Admitting: Family Medicine

## 2016-07-03 ENCOUNTER — Encounter: Payer: Self-pay | Admitting: Family Medicine

## 2016-07-03 VITALS — BP 130/80 | HR 64 | Resp 12 | Ht 65.0 in | Wt 181.5 lb

## 2016-07-03 DIAGNOSIS — G4700 Insomnia, unspecified: Secondary | ICD-10-CM | POA: Diagnosis not present

## 2016-07-03 DIAGNOSIS — E1149 Type 2 diabetes mellitus with other diabetic neurological complication: Secondary | ICD-10-CM | POA: Diagnosis not present

## 2016-07-03 DIAGNOSIS — K529 Noninfective gastroenteritis and colitis, unspecified: Secondary | ICD-10-CM | POA: Diagnosis not present

## 2016-07-03 DIAGNOSIS — I119 Hypertensive heart disease without heart failure: Secondary | ICD-10-CM

## 2016-07-03 DIAGNOSIS — M159 Polyosteoarthritis, unspecified: Secondary | ICD-10-CM | POA: Diagnosis not present

## 2016-07-03 DIAGNOSIS — R21 Rash and other nonspecific skin eruption: Secondary | ICD-10-CM | POA: Diagnosis not present

## 2016-07-03 LAB — HEMOGLOBIN A1C: Hgb A1c MFr Bld: 6.9 % — ABNORMAL HIGH (ref 4.6–6.5)

## 2016-07-03 LAB — COMPREHENSIVE METABOLIC PANEL
ALBUMIN: 4.3 g/dL (ref 3.5–5.2)
ALK PHOS: 68 U/L (ref 39–117)
ALT: 26 U/L (ref 0–53)
AST: 25 U/L (ref 0–37)
BILIRUBIN TOTAL: 0.7 mg/dL (ref 0.2–1.2)
BUN: 19 mg/dL (ref 6–23)
CO2: 28 mEq/L (ref 19–32)
CREATININE: 0.78 mg/dL (ref 0.40–1.50)
Calcium: 9.8 mg/dL (ref 8.4–10.5)
Chloride: 104 mEq/L (ref 96–112)
GFR: 101.19 mL/min (ref >60.00)
GLUCOSE: 179 mg/dL — AB (ref 70–99)
Potassium: 4.3 mEq/L (ref 3.5–5.1)
SODIUM: 140 meq/L (ref 135–145)
TOTAL PROTEIN: 6.6 g/dL (ref 6.0–8.3)

## 2016-07-03 LAB — C-REACTIVE PROTEIN: CRP: 0.3 mg/dL — ABNORMAL LOW (ref 0.5–20.0)

## 2016-07-03 LAB — MICROALBUMIN / CREATININE URINE RATIO
Creatinine,U: 128.6 mg/dL
MICROALB/CREAT RATIO: 0.5 mg/g (ref 0.0–30.0)
Microalb, Ur: 0.7 mg/dL (ref 0.0–1.9)

## 2016-07-03 LAB — CBC WITH DIFFERENTIAL/PLATELET
BASOS PCT: 0.9 % (ref 0.0–3.0)
Basophils Absolute: 0 10*3/uL (ref 0.0–0.1)
EOS ABS: 0.1 10*3/uL (ref 0.0–0.7)
Eosinophils Relative: 2.7 % (ref 0.0–5.0)
HEMATOCRIT: 42.7 % (ref 39.0–52.0)
Hemoglobin: 14.2 g/dL (ref 13.0–17.0)
LYMPHS ABS: 1.3 10*3/uL (ref 0.7–4.0)
LYMPHS PCT: 27.8 % (ref 12.0–46.0)
MCHC: 33.3 g/dL (ref 30.0–36.0)
MCV: 91.3 fl (ref 78.0–100.0)
MONO ABS: 0.5 10*3/uL (ref 0.1–1.0)
Monocytes Relative: 10.6 % (ref 3.0–12.0)
NEUTROS ABS: 2.8 10*3/uL (ref 1.4–7.7)
NEUTROS PCT: 58 % (ref 43.0–77.0)
PLATELETS: 176 10*3/uL (ref 150.0–400.0)
RBC: 4.68 Mil/uL (ref 4.22–5.81)
RDW: 13.5 % (ref 11.5–15.5)
WBC: 4.8 10*3/uL (ref 4.0–10.5)

## 2016-07-03 LAB — SEDIMENTATION RATE: SED RATE: 12 mm/h (ref 0–20)

## 2016-07-03 NOTE — Progress Notes (Signed)
Pre visit review using our clinic review tool, if applicable. No additional management support is needed unless otherwise documented below in the visit note. 

## 2016-07-03 NOTE — Patient Instructions (Addendum)
A few things to remember from today's visit:   Diabetes mellitus type 2 with neurological manifestations ([place]) - Plan: Hemoglobin A1c, Microalbumin / creatinine urine ratio, Comprehensive metabolic panel  Essential hypertension - Plan: Comprehensive metabolic panel  Insomnia, unspecified type  Cramp of both lower extremities  Chronic diarrhea  Rash and nonspecific skin eruption - Plan: Sed Rate (ESR), C-reactive protein, CBC with Differential/Platelet   Please be sure medication list is accurate. If a new problem present, please set up appointment sooner than planned today.

## 2016-07-04 MED ORDER — DIPHENOXYLATE-ATROPINE 2.5-0.025 MG PO TABS: 1.0000 | ORAL_TABLET | Freq: Three times a day (TID) | ORAL | 1 refills | Status: DC | PRN

## 2016-07-09 ENCOUNTER — Encounter: Payer: Self-pay | Admitting: Family Medicine

## 2016-07-21 ENCOUNTER — Encounter: Payer: Self-pay | Admitting: Family Medicine

## 2016-07-26 DIAGNOSIS — M179 Osteoarthritis of knee, unspecified: Secondary | ICD-10-CM | POA: Insufficient documentation

## 2016-07-26 DIAGNOSIS — M1710 Unilateral primary osteoarthritis, unspecified knee: Secondary | ICD-10-CM | POA: Insufficient documentation

## 2016-07-26 NOTE — Progress Notes (Signed)
HPI:   ACUTE VISIT:  Chief Complaint  Patient presents with  . Knee Pain    [name] is a 83 y.o. male, who is here today complaining of left knee pain and requesting knee injection. He has had intra articular knee injections in the past and he feels like it helped,last one given about a year ago. He has followed with orthopedist, TKR was recommended but he has not made a decision yet. He denies recent trauma.  Hx of DM II, BS' 170-200's. Lab Results  Component Value Date   HGBA1C 6.9 (H) 07/03/2016   He has Hx of crams,mainly on lower extremities but sometimes also on feet and hands.He mentions that he had an episode yesterday,left fingers and hand painful cramp after eating breakfast. It lasted a couple minutes. He has not identified exacerbating or alleviating factors.  He is currently on Gabapentin.  He denies focal weakness.  Lab Results  Component Value Date   CREATININE 0.78 07/03/2016   BUN 19 07/03/2016   NA 140 07/03/2016   K 4.3 07/03/2016   CL 104 07/03/2016   CO2 28 07/03/2016     Review of Systems  Constitutional: Positive for fatigue (no more than usual). Negative for fever.  Respiratory: Negative for cough, shortness of breath and wheezing.   Cardiovascular: Positive for leg swelling (stable). Negative for chest pain and palpitations.  Genitourinary: Negative for decreased urine volume and hematuria.  Musculoskeletal: Positive for arthralgias, back pain, gait problem and myalgias.  Skin: Negative for color change and pallor.  Neurological: Negative for syncope, weakness and headaches.  Psychiatric/Behavioral: Positive for sleep disturbance. Negative for confusion. The patient is nervous/anxious.       Current Outpatient Prescriptions on File Prior to Visit  Medication Sig Dispense Refill  . albuterol (PROVENTIL HFA;VENTOLIN HFA) 108 (90 Base) MCG/ACT inhaler Inhale 2 puffs into the lungs every 6 (six) hours as needed for wheezing or  shortness of breath. 1 Inhaler 0  . ALPRAZolam (XANAX) 0.5 MG tablet TAKE 1/2 - 1 TABLET BY MOUTH AT BEDTIME AS NEEDED 30 tablet 0  . aspirin 81 MG EC tablet Take 81 mg by mouth daily.     . diclofenac sodium (VOLTAREN) 1 % GEL Apply 4 g topically 4 (four) times daily. 400 g 2  . diphenoxylate-atropine (LOMOTIL) 2.5-0.025 MG tablet Take 1 tablet by mouth 3 (three) times daily as needed for diarrhea or loose stools. 180 tablet 1  . doxazosin (CARDURA) 2 MG tablet Take 1 tablet (2 mg total) by mouth daily. 90 tablet 2  . finasteride (PROSCAR) 5 MG tablet Take 1 tablet (5 mg total) by mouth daily. 90 tablet 1  . furosemide (LASIX) 40 MG tablet Take 1 tab (40 mg total) by mouth daily. You may take 1 extra lasix in the evening if you have SOB, LEE, and weight gain > 3lbs in 24 hours 180 tablet 1  . glipiZIDE (GLUCOTROL XL) 5 MG 24 hr tablet Take 1 tablet (5 mg total) by mouth daily with breakfast. 90 tablet 1  . Glucose Blood (FREESTYLE LITE TEST VI)     . methocarbamol (ROBAXIN) 500 MG tablet Take 1 tablet (500 mg total) by mouth 3 (three) times daily as needed for muscle spasms. 90 tablet 1  . metoprolol succinate (TOPROL-XL) 25 MG 24 hr tablet Take 1 tablet (25 mg total) by mouth daily. 90 tablet 1  . ONE TOUCH ULTRA TEST test strip USE TO CHECK BLOOD SUGAR DAILY  AND PRN 100 each 4  . ONETOUCH DELICA LANCETS 75I MISC USE TO CHECK BLOOD SUGAR DAILY AND PRN 100 each 4  . tamsulosin (FLOMAX) 0.4 MG CAPS capsule Take 1 capsule (0.4 mg total) by mouth daily after supper. 90 capsule 2  . timolol (TIMOPTIC-XR) 0.5 % ophthalmic gel-forming     . traMADol (ULTRAM) 50 MG tablet Take 2 tablets (100 mg total) by mouth 2 (two) times daily as needed. 120 tablet 0  . valsartan (DIOVAN) 320 MG tablet Take 1 tablet (320 mg total) by mouth daily. 90 tablet 3  . gabapentin (NEURONTIN) 300 MG capsule Take 3 capsules (900 mg total) by mouth 3 (three) times daily. 270 capsule 1   No current facility-administered  medications on file prior to visit.      Past Medical History:  Diagnosis Date  . BACK PAIN, CHRONIC 04/18/2009  . BENIGN PROSTATIC HYPERTROPHY, HX OF 01/19/2007  . Carpal tunnel syndrome 03/24/2007  . CORONARY ARTERY DISEASE 01/19/2007  . DEPRESSION, CHRONIC 06/24/2007  . DIABETES MELLITUS 01/19/2007  . DIABETES MELLITUS, TYPE II, WITH NEUROLOGICAL COMPLICATIONS 07/14/3293  . Diarrhea 02/23/2007  . HEART MURMUR, SYSTOLIC 1/88/4166  . HERPES SIMPLEX INFECTION 01/19/2007  . HYPERLIPIDEMIA 01/19/2007  . HYPERTENSION 01/19/2007  . Intermediate coronary syndrome ([place]) 08/29/2008  . LEG CRAMPS, NOCTURNAL 10/28/2007  . Leg cramps, sleep related 08/05/2010  . OBSTRUCTIVE SLEEP APNEA 01/19/2007   does not use CPAP  . OSTEOARTHRITIS 06/24/2007  . Other postprocedural status(V45.89) 01/19/2007  . PERCUTANEOUS TRANSLUMINAL CORONARY ANGIOPLASTY, HX OF 01/19/2007  . PLANTAR FASCIITIS, RIGHT 01/19/2007  . SHOULDER PAIN, LEFT 03/24/2007  . SPINAL STENOSIS, LUMBAR 06/03/2009  . TINEA PEDIS 11/15/2009  . URI 01/11/2009   No Known Allergies  Social History   Social History  . Marital status: Married    Spouse name: N/A  . Number of children: N/A  . Years of education: N/A   Occupational History  . 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 Smoking status: Former Smoker    Quit date: 04/14/1983  . Smokeless tobacco: Never Used     Comment: stopped 1985  . Alcohol use Yes     Comment: 2-3 times a week - wine, scotch  . Drug use: No  . Sexual activity: Not Asked   Other Topics Concern  . None   Social History Narrative   Married in 1959 - 69yrs, divorced; married 1974 - 1.5 years, divorced; married 1985 1 daughter 23; 66 step daughters; 2 grandchildren.    Vitals:   07/27/16 1501  BP: 140/90  Pulse: 91  Resp: 12   Body mass index is 29.7 kg/m.   Physical Exam  Nursing note and vitals reviewed. Constitutional: He is oriented to person, place, and time.  He appears well-developed. No distress.  HENT:  Head: Atraumatic.  Eyes: Conjunctivae are normal.  Cardiovascular: Normal rate and regular rhythm.   Murmur (SEM II/VI base) heard. Respiratory: Effort normal and breath sounds normal. No respiratory distress.  Musculoskeletal: He exhibits edema (1+ pitting edema LE bilateral.).  Left knee mild-moderate limitation of flexion ,elicits pain, + crepitus.Antalgic gait.   Neurological: He is alert and oriented to person, place, and time.  No focal deficit appreciated. Stable gait, assisted with cane  Skin: Skin is warm.  Psychiatric: His mood appears anxious.  Well groomed,good eye contact.    ASSESSMENT AND PLAN:   [name] was seen today for knee pain.  Diagnoses and  all orders for this visit:  Osteoarthritis of left knee, unspecified osteoarthritis type  We dicussed some benefits and the risk of intra articular knee injections,including meniscal injury,infections,bleeding,allergic reactions among some. He voices understanding and gives verbal consent. After cleaning area with iodine and alcohol and in sterile fashion Kenalog 40 mg and plain Lidocaine 1% 2 cc were injected in left knee,medial anterior approach. He tolerated procedure well,no complications. Reported moderate improvement of pain. Instructions given.  -     triamcinolone acetonide (KENALOG-40) injection 40 mg; Inject 1 mL (40 mg total) into the articular space once.  Cramps, extremity  Chronic. Continue Gabapentin. Stretching exercises,fall prevention.  Diabetes mellitus type 2 with neurological manifestations ([place])  Monitor BS's closely, side effects of Kenalog discussed. No changes in current management. Keep next appt.   Return if symptoms worsen or fail to improve.    Tashari Schoenfelder G. [name], MD  [facility]. [place] office.

## 2016-07-27 ENCOUNTER — Encounter: Payer: Self-pay | Admitting: Family Medicine

## 2016-07-27 ENCOUNTER — Ambulatory Visit (INDEPENDENT_AMBULATORY_CARE_PROVIDER_SITE_OTHER): Payer: PPO | Admitting: Family Medicine

## 2016-07-27 VITALS — BP 140/90 | HR 91 | Resp 12 | Ht 65.0 in | Wt 178.5 lb

## 2016-07-27 DIAGNOSIS — M1712 Unilateral primary osteoarthritis, left knee: Secondary | ICD-10-CM

## 2016-07-27 DIAGNOSIS — R252 Cramp and spasm: Secondary | ICD-10-CM

## 2016-07-27 DIAGNOSIS — E1149 Type 2 diabetes mellitus with other diabetic neurological complication: Secondary | ICD-10-CM

## 2016-07-27 MED ORDER — TRIAMCINOLONE ACETONIDE 40 MG/ML IJ SUSP
40.0000 mg | Freq: Once | INTRAMUSCULAR | Status: AC
  Administered 2016-07-27: 40 mg via INTRA_ARTICULAR

## 2016-07-27 NOTE — Progress Notes (Signed)
Pre visit review using our clinic review tool, if applicable. No additional management support is needed unless otherwise documented below in the visit note. 

## 2016-07-27 NOTE — Patient Instructions (Addendum)
WE NOW OFFER   [name]'s FAST TRACK!!!  SAME DAY Appointments for ACUTE CARE  Such as: Sprains, Injuries, cuts, abrasions, rashes, muscle pain, joint pain, back pain Colds, flu, sore throats, headache, allergies, cough, fever  Ear pain, sinus and eye infections Abdominal pain, nausea, vomiting, diarrhea, upset stomach Animal/insect bites  3 Easy Ways to Schedule: Walk-In Scheduling Call in scheduling Mychart Sign-up: [email or web address]     A few things to remember from today's visit:   Generalized osteoarthritis of multiple sites  Osteoarthritis of left knee, unspecified osteoarthritis type - Plan: triamcinolone acetonide (KENALOG-40) injection 40 mg   Please be sure medication list is accurate. If a new problem present, please set up appointment sooner than planned today.

## 2016-07-29 ENCOUNTER — Encounter: Payer: Self-pay | Admitting: Family Medicine

## 2016-08-18 ENCOUNTER — Encounter: Payer: Self-pay | Admitting: Family Medicine

## 2016-08-22 ENCOUNTER — Other Ambulatory Visit: Payer: Self-pay | Admitting: Family Medicine

## 2016-08-22 ENCOUNTER — Encounter: Payer: Self-pay | Admitting: Family Medicine

## 2016-08-24 MED ORDER — ALPRAZOLAM 0.25 MG PO TABS: ORAL_TABLET | ORAL | 1 refills | Status: DC

## 2016-08-24 NOTE — Telephone Encounter (Signed)
Rx for Xanax 0.25 mg can be called in to continue 1/2-1 tab at bedtime as needed for anxiety. #30/1. Thanks

## 2016-08-25 NOTE — Telephone Encounter (Signed)
Rx phoned in.   

## 2016-08-26 ENCOUNTER — Encounter: Payer: Self-pay | Admitting: Family Medicine

## 2016-08-27 ENCOUNTER — Other Ambulatory Visit: Payer: Self-pay | Admitting: Family Medicine

## 2016-08-27 DIAGNOSIS — E1149 Type 2 diabetes mellitus with other diabetic neurological complication: Secondary | ICD-10-CM

## 2016-08-27 MED ORDER — GABAPENTIN 300 MG PO CAPS: 300.0000 mg | ORAL_CAPSULE | Freq: Three times a day (TID) | ORAL | 1 refills | Status: DC

## 2016-09-07 ENCOUNTER — Encounter: Payer: Self-pay | Admitting: Family Medicine

## 2016-09-07 DIAGNOSIS — K529 Noninfective gastroenteritis and colitis, unspecified: Secondary | ICD-10-CM

## 2016-09-08 ENCOUNTER — Other Ambulatory Visit: Payer: Self-pay

## 2016-09-08 MED ORDER — FINASTERIDE 5 MG PO TABS: 5.0000 mg | ORAL_TABLET | Freq: Every day | ORAL | 1 refills | Status: DC

## 2016-09-08 NOTE — Telephone Encounter (Signed)
Okay to refill Lomotil? 

## 2016-09-09 ENCOUNTER — Other Ambulatory Visit: Payer: Self-pay | Admitting: Family Medicine

## 2016-09-09 DIAGNOSIS — R351 Nocturia: Principal | ICD-10-CM

## 2016-09-09 DIAGNOSIS — N401 Enlarged prostate with lower urinary tract symptoms: Secondary | ICD-10-CM

## 2016-09-09 MED ORDER — DIPHENOXYLATE-ATROPINE 2.5-0.025 MG PO TABS
1.0000 | ORAL_TABLET | Freq: Three times a day (TID) | ORAL | 3 refills | Status: DC | PRN
Start: 2016-09-09 — End: 2017-02-10

## 2016-09-09 MED ORDER — FINASTERIDE 5 MG PO TABS: 5.0000 mg | ORAL_TABLET | Freq: Every day | ORAL | 2 refills | Status: DC

## 2016-09-09 NOTE — Telephone Encounter (Signed)
Per pharmacy last refill was 3/5 & 0 refills remained on patient's file. Rx called into pharmacist for 90 tabs with 3 refills with same directions.

## 2016-09-15 ENCOUNTER — Encounter: Payer: Self-pay | Admitting: Family Medicine

## 2016-09-16 ENCOUNTER — Other Ambulatory Visit: Payer: Self-pay

## 2016-09-16 DIAGNOSIS — I10 Essential (primary) hypertension: Secondary | ICD-10-CM

## 2016-09-16 MED ORDER — METOPROLOL SUCCINATE ER 25 MG PO TB24: 25.0000 mg | ORAL_TABLET | Freq: Every day | ORAL | 1 refills | Status: DC

## 2016-09-16 MED ORDER — VALSARTAN 320 MG PO TABS: 320.0000 mg | ORAL_TABLET | Freq: Every day | ORAL | 1 refills | Status: DC

## 2016-10-02 ENCOUNTER — Encounter: Payer: Self-pay | Admitting: Family Medicine

## 2016-10-05 ENCOUNTER — Ambulatory Visit (INDEPENDENT_AMBULATORY_CARE_PROVIDER_SITE_OTHER): Payer: PPO | Admitting: Family Medicine

## 2016-10-05 ENCOUNTER — Encounter: Payer: Self-pay | Admitting: Family Medicine

## 2016-10-05 VITALS — BP 130/80 | HR 76 | Temp 97.6°F | Resp 16 | Ht 65.0 in | Wt 183.1 lb

## 2016-10-05 DIAGNOSIS — J441 Chronic obstructive pulmonary disease with (acute) exacerbation: Secondary | ICD-10-CM

## 2016-10-05 DIAGNOSIS — J988 Other specified respiratory disorders: Secondary | ICD-10-CM | POA: Diagnosis not present

## 2016-10-05 MED ORDER — DOXYCYCLINE HYCLATE 100 MG PO TABS: 100.0000 mg | ORAL_TABLET | Freq: Two times a day (BID) | ORAL | 0 refills | Status: AC

## 2016-10-05 MED ORDER — ALBUTEROL SULFATE HFA 108 (90 BASE) MCG/ACT IN AERS: 2.0000 | INHALATION_SPRAY | Freq: Four times a day (QID) | RESPIRATORY_TRACT | 1 refills | Status: DC | PRN

## 2016-10-05 MED ORDER — METHYLPREDNISOLONE ACETATE 80 MG/ML IJ SUSP
40.0000 mg | Freq: Once | INTRAMUSCULAR | Status: AC
  Administered 2016-10-05: 40 mg via INTRAMUSCULAR

## 2016-10-05 MED ORDER — IPRATROPIUM-ALBUTEROL 0.5-2.5 (3) MG/3ML IN SOLN
3.0000 mL | Freq: Once | RESPIRATORY_TRACT | Status: AC
  Administered 2016-10-05: 3 mL via RESPIRATORY_TRACT

## 2016-10-05 MED ORDER — AEROCHAMBER PLUS MISC: 1 refills | Status: DC

## 2016-10-05 MED ORDER — PREDNISONE 20 MG PO TABS: 40.0000 mg | ORAL_TABLET | Freq: Every day | ORAL | 0 refills | Status: DC

## 2016-10-05 MED ORDER — TAMSULOSIN HCL 0.4 MG PO CAPS
0.4000 mg | ORAL_CAPSULE | Freq: Every day | ORAL | 1 refills | Status: DC
Start: 2016-10-05 — End: 2017-04-18

## 2016-10-05 NOTE — Progress Notes (Signed)
HPI:  ACUTE VISIT  Chief Complaint  Patient presents with  . Cough    [name] is a 83 y.o.male here today with his wife complaining of 2 days of respiratory symptoms.  Hx of COPD, former smoker, DM II, HTN, and diastolic dysfunction.  Cough  This is a new problem. The current episode started in the past 7 days. The problem has been gradually worsening. The problem occurs constantly. The cough is non-productive. Associated symptoms include myalgias, nasal congestion, postnasal drip, rhinorrhea, a sore throat ("Little"), shortness of breath and wheezing. Pertinent negatives include no chest pain, chills, ear congestion, ear pain, eye redness, fever, headaches, hemoptysis, rash or sweats. The symptoms are aggravated by exercise. Risk factors for lung disease include smoking/tobacco exposure. He has tried OTC cough suppressant and a beta-agonist inhaler for the symptoms. The treatment provided no relief. His past medical history is significant for COPD, emphysema and environmental allergies.   Non productive cough. Dyspnea and wheezing at rest. He denies orthopnea or PND.  Symptoms are worse at night and early morning. No Hx of recent travel. + Sick contact, his wife was sick recently with URI. No known insect bite.  Hx of allergies: Yes.  OTC medications for this problem: Mucinex and Robitussin. He used Albuterol inh yesterday and today morning, did not seem to help much.Albuterol inh may be expired.   DM II, BS checked yesterday, 160.    Review of Systems  Constitutional: Positive for appetite change and fatigue. Negative for chills and fever.  HENT: Positive for postnasal drip, rhinorrhea and sore throat ("Little"). Negative for ear pain and sinus pain.   Eyes: Negative for redness and visual disturbance.  Respiratory: Positive for cough, shortness of breath and wheezing. Negative for hemoptysis.   Cardiovascular: Positive for leg swelling (No more than usual).  Negative for chest pain.  Gastrointestinal: Positive for nausea. Negative for abdominal pain and vomiting.       No changes in bowel habits.  Genitourinary: Negative for decreased urine volume, dysuria and hematuria.  Musculoskeletal: Positive for arthralgias, gait problem and myalgias.       Hx of generalized OA.  Skin: Negative for pallor and rash.  Allergic/Immunologic: Positive for environmental allergies.  Neurological: Negative for syncope, weakness and headaches.  Psychiatric/Behavioral: Positive for sleep disturbance. Negative for confusion. The patient is nervous/anxious.       Current Outpatient Prescriptions on File Prior to Visit  Medication Sig Dispense Refill  . ALPRAZolam (XANAX) 0.25 MG tablet TAKE 1/2 - 1 TABLET BY MOUTH AT BEDTIME AS NEEDED 30 tablet 1  . aspirin 81 MG EC tablet Take 81 mg by mouth daily.     . diclofenac sodium (VOLTAREN) 1 % GEL Apply 4 g topically 4 (four) times daily. 400 g 2  . diphenoxylate-atropine (LOMOTIL) 2.5-0.025 MG tablet Take 1 tablet by mouth 3 (three) times daily as needed for diarrhea or loose stools. 90 tablet 3  . doxazosin (CARDURA) 2 MG tablet Take 1 tablet (2 mg total) by mouth daily. 90 tablet 2  . finasteride (PROSCAR) 5 MG tablet Take 1 tablet (5 mg total) by mouth daily. 90 tablet 2  . furosemide (LASIX) 40 MG tablet Take 1 tab (40 mg total) by mouth daily. You may take 1 extra lasix in the evening if you have SOB, LEE, and weight gain > 3lbs in 24 hours 180 tablet 1  . gabapentin (NEURONTIN) 300 MG capsule Take 1 capsule (300 mg total)  by mouth 3 (three) times daily. 180 capsule 1  . glipiZIDE (GLUCOTROL XL) 5 MG 24 hr tablet Take 1 tablet (5 mg total) by mouth daily with breakfast. 90 tablet 1  . Glucose Blood (FREESTYLE LITE TEST VI)     . methocarbamol (ROBAXIN) 500 MG tablet Take 1 tablet (500 mg total) by mouth 3 (three) times daily as needed for muscle spasms. 90 tablet 1  . metoprolol succinate (TOPROL-XL) 25 MG 24 hr  tablet Take 1 tablet (25 mg total) by mouth daily. 90 tablet 1  . ONE TOUCH ULTRA TEST test strip USE TO CHECK BLOOD SUGAR DAILY AND PRN 100 each 4  . ONETOUCH DELICA LANCETS 34L MISC USE TO CHECK BLOOD SUGAR DAILY AND PRN 100 each 4  . tamsulosin (FLOMAX) 0.4 MG CAPS capsule Take 1 capsule (0.4 mg total) by mouth daily after supper. 90 capsule 1  . timolol (TIMOPTIC-XR) 0.5 % ophthalmic gel-forming     . valsartan (DIOVAN) 320 MG tablet Take 1 tablet (320 mg total) by mouth daily. 90 tablet 1   No current facility-administered medications on file prior to visit.      Past Medical History:  Diagnosis Date  . BACK PAIN, CHRONIC 04/18/2009  . BENIGN PROSTATIC HYPERTROPHY, HX OF 01/19/2007  . Carpal tunnel syndrome 03/24/2007  . CORONARY ARTERY DISEASE 01/19/2007  . DEPRESSION, CHRONIC 06/24/2007  . DIABETES MELLITUS 01/19/2007  . DIABETES MELLITUS, TYPE II, WITH NEUROLOGICAL COMPLICATIONS 12/14/7900  . Diarrhea 02/23/2007  . HEART MURMUR, SYSTOLIC 07/21/7351  . HERPES SIMPLEX INFECTION 01/19/2007  . HYPERLIPIDEMIA 01/19/2007  . HYPERTENSION 01/19/2007  . Intermediate coronary syndrome ([place]) 08/29/2008  . LEG CRAMPS, NOCTURNAL 10/28/2007  . Leg cramps, sleep related 08/05/2010  . OBSTRUCTIVE SLEEP APNEA 01/19/2007   does not use CPAP  . OSTEOARTHRITIS 06/24/2007  . Other postprocedural status(V45.89) 01/19/2007  . PERCUTANEOUS TRANSLUMINAL CORONARY ANGIOPLASTY, HX OF 01/19/2007  . PLANTAR FASCIITIS, RIGHT 01/19/2007  . SHOULDER PAIN, LEFT 03/24/2007  . SPINAL STENOSIS, LUMBAR 06/03/2009  . TINEA PEDIS 11/15/2009  . URI 01/11/2009   No Known Allergies  Social History   Social History  . Marital status: Married    Spouse name: N/A  . Number of children: N/A  . Years of education: N/A   Occupational History  . 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 Smoking status: Former Smoker    Quit date: 04/14/1983  . Smokeless tobacco: Never Used     Comment: stopped  1985  . Alcohol use Yes     Comment: 2-3 times a week - wine, scotch  . Drug use: No  . Sexual activity: Not Asked   Other Topics Concern  . None   Social History Narrative   Married in 1959 - 2yrs, divorced; married 1974 - 1.5 years, divorced; married 1985 1 daughter 30; 11 step daughters; 2 grandchildren.    Vitals:   10/05/16 1629  BP: 130/80  Pulse: 76  Resp: 16  Temp: 97.6 F (36.4 C)  O2 sat at RA 93% Body mass index is 30.47 kg/m.   Physical Exam  Nursing note and vitals reviewed. Constitutional: He is oriented to person, place, and time. He appears well-developed.  Non-toxic appearance. He appears ill. No distress.  HENT:  Head: Atraumatic.  Nose: Rhinorrhea present. Right sinus exhibits no maxillary sinus tenderness and no frontal sinus tenderness. Left sinus exhibits no maxillary sinus tenderness and no frontal sinus tenderness.  Mouth/Throat: Oropharynx  is clear and moist and mucous membranes are normal.  Hearing aids bilateral. Nasal voice.  Eyes: Conjunctivae and EOM are normal.  Cardiovascular: Normal rate and regular rhythm.   Murmur (SEM II/VI RUSB) heard. Respiratory: Effort normal. No respiratory distress. He has wheezes. He has no rhonchi. He has no rales.  Lymphadenopathy:    He has no cervical adenopathy.  Neurological: He is alert and oriented to person, place, and time. He has normal strength.  Antalgic gait assisted with a cane.  Skin: Skin is warm. No erythema.  Psychiatric: He has a normal mood and affect. His speech is normal.  Well groomed, good eye contact.    ASSESSMENT AND PLAN:   [name] was seen today for cough.  Diagnoses and all orders for this visit:  COPD exacerbation ([place])  DuoNeb was well tolerated, no rales or rhonchi, persistent wheezing bilateral. After verbal consent she received Depo-Medrol 40 mg IM. He tolerated treatment well. Some side effects of prednisone and doxycycline discussed, he has taken both in the  past and had been well tolerated. Clearly instructed about warning signs. Albuterol inh 2 puff every 6 hours for a week then as needed for wheezing or shortness of breath.   Follow-up in 5 days, before if needed.  -     predniSONE (DELTASONE) 20 MG tablet; Take 2 tablets (40 mg total) by mouth daily with breakfast. -     Spacer/Aero-Holding Chambers (AEROCHAMBER PLUS) inhaler; Use as instructed to use with inahaler. -     albuterol (PROVENTIL HFA;VENTOLIN HFA) 108 (90 Base) MCG/ACT inhaler; Inhale 2 puffs into the lungs every 6 (six) hours as needed for wheezing or shortness of breath. -     doxycycline (VIBRA-TABS) 100 MG tablet; Take 1 tablet (100 mg total) by mouth 2 (two) times daily. -     DG Chest 2 View; Future  Respiratory tract infection  Explained that it could be viral but because age,COPD,and DM II I decided to treat empirically with abx. Further recommendations will be given according to imaging results. Adequate hydration. He can continue plain Mucinex.   -     doxycycline (VIBRA-TABS) 100 MG tablet; Take 1 tablet (100 mg total) by mouth 2 (two) times daily. -     DG Chest 2 View; Future     -[name] was advised to seek attention immediately if symptoms worsen, he and his wife voice understanding.        Eran Mistry G. [name], MD  [facility]. [place] office.

## 2016-10-05 NOTE — Telephone Encounter (Signed)
Last refill on Xanax was 5.15.18.  Okay to refill?

## 2016-10-05 NOTE — Patient Instructions (Signed)
A few things to remember from today's visit:   COPD exacerbation ([place])   Please be sure medication list is accurate. If a new problem present, please set up appointment sooner than planned today.

## 2016-10-06 ENCOUNTER — Ambulatory Visit (INDEPENDENT_AMBULATORY_CARE_PROVIDER_SITE_OTHER)
Admission: RE | Admit: 2016-10-06 | Discharge: 2016-10-06 | Disposition: A | Discharge status: Home / Self Care | Payer: PPO | Source: Ambulatory Visit | Attending: Family Medicine | Admitting: Family Medicine

## 2016-10-06 ENCOUNTER — Encounter: Payer: Self-pay | Admitting: Family Medicine

## 2016-10-06 DIAGNOSIS — J988 Other specified respiratory disorders: Secondary | ICD-10-CM

## 2016-10-06 DIAGNOSIS — J441 Chronic obstructive pulmonary disease with (acute) exacerbation: Secondary | ICD-10-CM | POA: Diagnosis not present

## 2016-10-06 DIAGNOSIS — R05 Cough: Secondary | ICD-10-CM | POA: Diagnosis not present

## 2016-10-07 MED ORDER — ALPRAZOLAM 0.25 MG PO TABS: ORAL_TABLET | ORAL | 1 refills | Status: DC

## 2016-10-07 NOTE — Telephone Encounter (Signed)
Alprazolam 0.25 mg can be continue at bedtime as needed, # 30/1 Thanks, BJ

## 2016-10-07 NOTE — Telephone Encounter (Signed)
Rx phoned in.   

## 2016-10-09 ENCOUNTER — Encounter: Payer: Self-pay | Admitting: Family Medicine

## 2016-10-09 ENCOUNTER — Ambulatory Visit (INDEPENDENT_AMBULATORY_CARE_PROVIDER_SITE_OTHER): Payer: PPO | Admitting: Family Medicine

## 2016-10-09 VITALS — BP 138/88 | HR 81 | Temp 98.1°F | Resp 12 | Ht 65.0 in | Wt 181.1 lb

## 2016-10-09 DIAGNOSIS — R21 Rash and other nonspecific skin eruption: Secondary | ICD-10-CM

## 2016-10-09 DIAGNOSIS — R140 Abdominal distension (gaseous): Secondary | ICD-10-CM

## 2016-10-09 DIAGNOSIS — F419 Anxiety disorder, unspecified: Secondary | ICD-10-CM | POA: Diagnosis not present

## 2016-10-09 DIAGNOSIS — F331 Major depressive disorder, recurrent, moderate: Secondary | ICD-10-CM | POA: Diagnosis not present

## 2016-10-09 DIAGNOSIS — J441 Chronic obstructive pulmonary disease with (acute) exacerbation: Secondary | ICD-10-CM

## 2016-10-09 MED ORDER — SERTRALINE HCL 50 MG PO TABS: 25.0000 mg | ORAL_TABLET | Freq: Every day | ORAL | 1 refills | Status: DC

## 2016-10-09 MED ORDER — IPRATROPIUM-ALBUTEROL 20-100 MCG/ACT IN AERS: 1.0000 | INHALATION_SPRAY | Freq: Four times a day (QID) | RESPIRATORY_TRACT | 2 refills | Status: DC

## 2016-10-09 MED ORDER — PREDNISONE 20 MG PO TABS: ORAL_TABLET | ORAL | 0 refills | Status: DC

## 2016-10-09 NOTE — Patient Instructions (Signed)
A few things to remember from today's visit:   COPD exacerbation ([place]) - Plan: predniSONE (DELTASONE) 20 MG tablet, Ipratropium-Albuterol (COMBIVENT RESPIMAT) 20-100 MCG/ACT AERS respimat  Depression, major, recurrent, moderate ([place]) - Plan: sertraline (ZOLOFT) 50 MG tablet  Anxiety disorder, unspecified type - Plan: sertraline (ZOLOFT) 50 MG tablet  Rash and nonspecific skin eruption - Plan: Ambulatory referral to Dermatology  Abdominal distention - Plan: US Abdomen Complete   Today we started Sertraline, this type of medications can increase suicidal risk. This is more prevalent among children,adolecents, and young adults with major depression or other psychiatric disorders. It can also make depression worse. Most common side effects are gastrointestinal, self limited after a few weeks: diarrhea, nausea, constipation  Or diarrhea among some.  In general it is well tolerated. We will follow closely.      Please be sure medication list is accurate. If a new problem present, please set up appointment sooner than planned today.

## 2016-10-09 NOTE — Progress Notes (Signed)
HPI:   [name] is a 83 y.o. male, who is here today with his wife to follow on recent OV.   He was seen on 10/05/16 for COPD exacerbation. He was started on Doxycycline 100 mg twice daily, today 5th day of abx.  He is also taking prednisone 40 mg daily and albuterol inhaler 2 puffs every 6 hours. He is tolerating medication well, he doesn't feel like albuterol is helping and wheezing. She denies any fever, chills, or chest pain. He still has wheezing and now he has productive cough, with clear, thick sputum. Exertional dyspnea has improved.  Overall he is feeling better, no new symptoms reported.  Concerns today: His wife is concerned about abdominal distention. She feels like his abdominal circumference has increased and is contributing to his difficulty breathing. He denies any history of alcohol abuse but he acknowledges that he is drinking more than usual. Alcohol seems to help with insomnia and depressed mood.   Denies unusual abdominal pain, nausea, vomiting, changes in bowel habits, blood in stool or melena.  Hx of IBS-D, stable.  Lab Results  Component Value Date   ALT 26 07/03/2016   AST 25 07/03/2016   ALKPHOS 68 07/03/2016   BILITOT 0.7 07/03/2016     Anxiety/depression:  He is also complaining of worsening anxiety, he is taking Xanax at bedtime but he has also taken it during the day to help with "shaking" episodes. He has tried Cymbalta a couple times in the past, he didn't tolerate well. He also remembers taking Zoloft and other anxiolytic/antidepressant medications.  He denies having a suicidal plan or ideation but tells me that sometimes he wishes to die while he is asleep. He attributes depressed mood to his physical limitation due to chronic pain. He is not able to do things he really enjoys.  He also tells me that about 3-4 days ago he tried marijuana, he got a "puff" from a neighbor and helped some with anxiety.  He denies any other  illicit drug use.  Rash: He still has tender rash on lower extremities. He has had this rash for about 3-4 months, he tried topical steroids and antimycotic medication with no improvement. He has not noted any improvement while he's been on Prednisone either. Some lesions are increasing in size. He has not identified exacerbating factors. No Hx of insect bites or known exposure.   Review of Systems  Constitutional: Positive for fatigue. Negative for appetite change, chills and fever.  HENT: Negative for congestion, facial swelling, mouth sores, nosebleeds, sore throat and trouble swallowing.   Respiratory: Positive for cough, shortness of breath and wheezing.        Hx of OSA, has not tolerated CPAP.  Cardiovascular: Positive for leg swelling (at his baseline). Negative for chest pain and palpitations.  Gastrointestinal: Negative for abdominal pain, nausea and vomiting.       No changes in bowel habits.  Genitourinary: Negative for decreased urine volume and hematuria.  Musculoskeletal: Positive for arthralgias, back pain and gait problem.  Skin: Positive for rash. Negative for wound.  Neurological: Negative for syncope, weakness and headaches.  Psychiatric/Behavioral: Positive for sleep disturbance. Negative for confusion and hallucinations. The patient is nervous/anxious.       Current Outpatient Prescriptions on File Prior to Visit  Medication Sig Dispense Refill  . albuterol (PROVENTIL HFA;VENTOLIN HFA) 108 (90 Base) MCG/ACT inhaler Inhale 2 puffs into the lungs every 6 (six) hours as needed for wheezing or  shortness of breath. 1 Inhaler 1  . ALPRAZolam (XANAX) 0.25 MG tablet TAKE 1/2 - 1 TABLET BY MOUTH AT BEDTIME AS NEEDED 30 tablet 1  . aspirin 81 MG EC tablet Take 81 mg by mouth daily.     . diclofenac sodium (VOLTAREN) 1 % GEL Apply 4 g topically 4 (four) times daily. 400 g 2  . diphenoxylate-atropine (LOMOTIL) 2.5-0.025 MG tablet Take 1 tablet by mouth 3 (three) times daily  as needed for diarrhea or loose stools. 90 tablet 3  . doxazosin (CARDURA) 2 MG tablet Take 1 tablet (2 mg total) by mouth daily. 90 tablet 2  . doxycycline (VIBRA-TABS) 100 MG tablet Take 1 tablet (100 mg total) by mouth 2 (two) times daily. 14 tablet 0  . finasteride (PROSCAR) 5 MG tablet Take 1 tablet (5 mg total) by mouth daily. 90 tablet 2  . furosemide (LASIX) 40 MG tablet Take 1 tab (40 mg total) by mouth daily. You may take 1 extra lasix in the evening if you have SOB, LEE, and weight gain > 3lbs in 24 hours 180 tablet 1  . gabapentin (NEURONTIN) 300 MG capsule Take 1 capsule (300 mg total) by mouth 3 (three) times daily. 180 capsule 1  . glipiZIDE (GLUCOTROL XL) 5 MG 24 hr tablet Take 1 tablet (5 mg total) by mouth daily with breakfast. 90 tablet 1  . Glucose Blood (FREESTYLE LITE TEST VI)     . methocarbamol (ROBAXIN) 500 MG tablet Take 1 tablet (500 mg total) by mouth 3 (three) times daily as needed for muscle spasms. 90 tablet 1  . metoprolol succinate (TOPROL-XL) 25 MG 24 hr tablet Take 1 tablet (25 mg total) by mouth daily. 90 tablet 1  . ONE TOUCH ULTRA TEST test strip USE TO CHECK BLOOD SUGAR DAILY AND PRN 100 each 4  . ONETOUCH DELICA LANCETS 02D MISC USE TO CHECK BLOOD SUGAR DAILY AND PRN 100 each 4  . Spacer/Aero-Holding Chambers (AEROCHAMBER PLUS) inhaler Use as instructed to use with inahaler. 1 each 1  . tamsulosin (FLOMAX) 0.4 MG CAPS capsule Take 1 capsule (0.4 mg total) by mouth daily after supper. 90 capsule 1  . timolol (TIMOPTIC-XR) 0.5 % ophthalmic gel-forming     . valsartan (DIOVAN) 320 MG tablet Take 1 tablet (320 mg total) by mouth daily. 90 tablet 1   No current facility-administered medications on file prior to visit.      Past Medical History:  Diagnosis Date  . BACK PAIN, CHRONIC 04/18/2009  . BENIGN PROSTATIC HYPERTROPHY, HX OF 01/19/2007  . Carpal tunnel syndrome 03/24/2007  . CORONARY ARTERY DISEASE 01/19/2007  . DEPRESSION, CHRONIC 06/24/2007  .  DIABETES MELLITUS 01/19/2007  . DIABETES MELLITUS, TYPE II, WITH NEUROLOGICAL COMPLICATIONS 10/14/1285  . Diarrhea 02/23/2007  . HEART MURMUR, SYSTOLIC 8/67/6720  . HERPES SIMPLEX INFECTION 01/19/2007  . HYPERLIPIDEMIA 01/19/2007  . HYPERTENSION 01/19/2007  . Intermediate coronary syndrome ([place]) 08/29/2008  . LEG CRAMPS, NOCTURNAL 10/28/2007  . Leg cramps, sleep related 08/05/2010  . OBSTRUCTIVE SLEEP APNEA 01/19/2007   does not use CPAP  . OSTEOARTHRITIS 06/24/2007  . Other postprocedural status(V45.89) 01/19/2007  . PERCUTANEOUS TRANSLUMINAL CORONARY ANGIOPLASTY, HX OF 01/19/2007  . PLANTAR FASCIITIS, RIGHT 01/19/2007  . SHOULDER PAIN, LEFT 03/24/2007  . SPINAL STENOSIS, LUMBAR 06/03/2009  . TINEA PEDIS 11/15/2009  . URI 01/11/2009   No Known Allergies  Social History   Social History  . Marital status: Married    Spouse name: N/A  . Number of  children: N/A  . Years of education: N/A   Occupational History  . 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 Smoking status: Former Smoker    Quit date: 04/14/1983  . Smokeless tobacco: Never Used     Comment: stopped 1985  . Alcohol use Yes     Comment: 2-3 times a week - wine, scotch  . Drug use: No  . Sexual activity: Not Asked   Other Topics Concern  . None   Social History Narrative   Married in 1959 - 38yrs, divorced; married 1974 - 1.5 years, divorced; married 1985 1 daughter 62; 44 step daughters; 2 grandchildren.    Vitals:   10/09/16 1155  BP: 138/88  Pulse: 81  Resp: 12  Temp: 98.1 F (36.7 C)  O2 sat at RA 95% Body mass index is 30.14 kg/m.   Physical Exam  Nursing note and vitals reviewed. Constitutional: He is oriented to person, place, and time. He appears well-developed. No distress.  HENT:  Head: Atraumatic.  Mouth/Throat: Oropharynx is clear and moist and mucous membranes are normal.  Eyes: Conjunctivae and EOM are normal. Pupils are equal, round, and reactive to light.    Neck: No JVD present.  Cardiovascular: Normal rate and regular rhythm.   Murmur (SEM I/VI RUSB-LUSB) heard. Pulses:      Dorsalis pedis pulses are 2+ on the right side, and 2+ on the left side.  Respiratory: Effort normal. No respiratory distress. He has wheezes. He has no rhonchi. He has no rales.  GI: Soft. He exhibits distension. He exhibits no ascites and no mass. There is no hepatomegaly. There is no tenderness.  Musculoskeletal: He exhibits edema (1+ pitting LE edema, bilateral.). He exhibits no tenderness.  Lymphadenopathy:    He has no cervical adenopathy.  Neurological: He is alert and oriented to person, place, and time. He has normal strength.  Stable gait assisted by cane.  Skin: Skin is warm. Rash noted. Rash is macular. Rash is not vesicular. No erythema.  Erythematous, scaly rash on LE, bilateral, R>L. Clear center, regular borders, no tender, no local heat or induration. 1-2 cm to 3-4 cm.   Psychiatric: His mood appears anxious. He exhibits a depressed mood. He expresses no suicidal ideation. He expresses no suicidal plans.  Well groomed, good eye contact.    ASSESSMENT AND PLAN:   [name] was seen today for follow-up.  Diagnoses and all orders for this visit:  COPD exacerbation ([place])  Symptoms mildly better, still having wheezing. Complete abx treatment. Prednisone taper to continue. Stop Albuterol and try Combivent qid. Instructed about warning signs. F/U in 2-3 weeks.  -     predniSONE (DELTASONE) 20 MG tablet; 2 tabs for 6 days, 2 tabs for 4 days, 1 tabs for 3 days, and 1/2 tab for 3 days. Take tables together with breakfast. -     Ipratropium-Albuterol (COMBIVENT RESPIMAT) 20-100 MCG/ACT AERS respimat; Inhale 1 puff into the lungs every 6 (six) hours.  Rash and nonspecific skin eruption  Not improved with topical antimycotic or steroid cream, intermittent. Dermatology referral placed.  -     Ambulatory referral to Dermatology  Abdominal  distention  No masses palpated. Given his Hx of alcohol abuse abdominal US was ordered to evaluate for ascitics or other abnormality. Further recommendations will be given according to imaging results.  -     US Abdomen Complete; Future  Depression, major, recurrent, moderate ([facility])  We discussed treatment  options and some side effects, which include suicidal thoughts among some. He agrees on trying a low odse. F/U in 2-3 weeks.   -     sertraline (ZOLOFT) 50 MG tablet; Take 0.5 tablets (25 mg total) by mouth daily.  Anxiety disorder, unspecified type  Some side effects of Xanax discussed, recommend not to take it more than once daily.  -     sertraline (ZOLOFT) 50 MG tablet; Take 0.5 tablets (25 mg total) by mouth daily.    [name]. [name], MD  [facility]. [place] office.

## 2016-10-23 ENCOUNTER — Other Ambulatory Visit: Payer: PPO

## 2016-10-29 NOTE — Progress Notes (Signed)
HPI:   [name] is a 83 y.o. male, who is here today with his wife to follow on recent OV.   He was seen on 10/09/16, when Zoloft 25 mg was started for depression and anxiety. He did not start medication. He is also taking Xanax 0.25 mg daily as needed.   He has Hx if insomnia, Xanax also helps him sleep.   COPD exacerbation:  He completed Prednisone taper and reporting complete resolution of symptoms. Last OV Combivent Respimat recommended but he did not fill Rx.  Las OV his wife was concerned about increased abdominal circumference. Because alcohol intake higher than recommended I ordered abdominal US. He cancelled [place] appt, states that he is not interested in having procedures, even if he has an abnormality on imaging he will not pursue treatment. He is not drinking as much as he was doing around last OV.   Diabetes Mellitus II:   Currently on Glipizide XL 5 mg daily.  Checking BS's : FG 80's-170, most < 150. Post prandial (after lunch) 300's Hypoglycemia:Denies.  He is tolerating medications well. He denies nausea, vomiting, polydipsia, polyuria, or polyphagia. + Peripheral neuropathy.    Lab Results  Component Value Date   CREATININE 0.78 07/03/2016   BUN 19 07/03/2016   NA 140 07/03/2016   K 4.3 07/03/2016   CL 104 07/03/2016   CO2 28 07/03/2016    Lab Results  Component Value Date   HGBA1C 6.9 (H) 07/03/2016   Lab Results  Component Value Date   MICROALBUR 0.7 07/03/2016    HTN:  Currently he is on Diovan 320 mg daily, Metoprolol Succinate 25 mg daily, and Cardura 2 mg at bedtime. Denies headache, visual changes, chest pain, dyspnea, palpitation, claudication, focal weakness, or worsening edema. Hx of right eye glaucoma, he has not followed with ophthalmologists.  Diastolic CHF, he is on Furosemide 40 mg daily. He denies orthopnea or PND.  Chronic diarrhea:  He is on Lomotil 25-0.025 mg tid, his wife states that sometimes he takes  more than instructed.  Medication has helped with diarrhea if he also avoid trigger factors: overeating. He denies blood in stool. Episose of fecal incontinence have decreased greatly and just happen when he eats "too much" or with certain food.  He is tolerating medication well.  C/O fatigue, which is chronic, has ben going on for years. Hx of OSA, he did not tolerate CPAP.  Lab Results  Component Value Date   TSH 1.60 10/16/2015   Lab Results  Component Value Date   WBC 4.8 07/03/2016   HGB 14.2 07/03/2016   HCT 42.7 07/03/2016   MCV 91.3 07/03/2016   PLT 176.0 07/03/2016   Lab Results  Component Value Date   ESRSEDRATE 12 07/03/2016   Lab Results  Component Value Date   CRP 0.3 (L) 07/03/2016    Cramps LE and UE, he has not identified exacerbating factors, alleviated by waking.Sometimes he also has episodes during the day but more frequent at night.  He is on Gabapentin 300 mg TID, he would like to increase dose. He has Hx of chronic pain: Generalized OA and back pain. He was on Methocarbamol 500 mg tid and he felt like it was helping.He is also requesting refill. He did not continue following with Dr Posey Pronto because he was receiving bills for hospital room that was used for visit.   Hx of BPH, he did not want to continue following with urologists. He denies changes  in urine frequency. Denies dysuria, gross hematuria,or decreased urine output.  He is currently on Flomax 0.4 mg daily and Proscar 5 mg daily.   Review of Systems  Constitutional: Positive for fatigue. Negative for activity change, appetite change, fever and unexpected weight change.  HENT: Negative for mouth sores, nosebleeds, sore throat and trouble swallowing.   Eyes: Negative for redness and visual disturbance.  Respiratory: Negative for cough, shortness of breath and wheezing.   Cardiovascular: Negative for chest pain, palpitations and leg swelling.  Gastrointestinal: Positive for diarrhea.  Negative for abdominal pain, blood in stool, nausea and vomiting.  Endocrine: Negative for cold intolerance, heat intolerance, polydipsia, polyphagia and polyuria.  Genitourinary: Negative for decreased urine volume, dysuria and hematuria.  Musculoskeletal: Positive for arthralgias, back pain, gait problem and myalgias.  Skin: Negative for rash.  Neurological: Negative for syncope, weakness and headaches.  Psychiatric/Behavioral: Positive for sleep disturbance. Negative for confusion and suicidal ideas. The patient is nervous/anxious.       Current Outpatient Prescriptions on File Prior to Visit  Medication Sig Dispense Refill  . albuterol (PROVENTIL HFA;VENTOLIN HFA) 108 (90 Base) MCG/ACT inhaler Inhale 2 puffs into the lungs every 6 (six) hours as needed for wheezing or shortness of breath. 1 Inhaler 1  . ALPRAZolam (XANAX) 0.25 MG tablet TAKE 1/2 - 1 TABLET BY MOUTH AT BEDTIME AS NEEDED 30 tablet 1  . aspirin 81 MG EC tablet Take 81 mg by mouth daily.     . diclofenac sodium (VOLTAREN) 1 % GEL Apply 4 g topically 4 (four) times daily. 400 g 2  . diphenoxylate-atropine (LOMOTIL) 2.5-0.025 MG tablet Take 1 tablet by mouth 3 (three) times daily as needed for diarrhea or loose stools. 90 tablet 3  . doxazosin (CARDURA) 2 MG tablet Take 1 tablet (2 mg total) by mouth daily. 90 tablet 2  . finasteride (PROSCAR) 5 MG tablet Take 1 tablet (5 mg total) by mouth daily. 90 tablet 2  . furosemide (LASIX) 40 MG tablet Take 1 tab (40 mg total) by mouth daily. You may take 1 extra lasix in the evening if you have SOB, LEE, and weight gain > 3lbs in 24 hours 180 tablet 1  . glipiZIDE (GLUCOTROL XL) 5 MG 24 hr tablet Take 1 tablet (5 mg total) by mouth daily with breakfast. 90 tablet 1  . Glucose Blood (FREESTYLE LITE TEST VI)     . Ipratropium-Albuterol (COMBIVENT RESPIMAT) 20-100 MCG/ACT AERS respimat Inhale 1 puff into the lungs every 6 (six) hours. 4 g 2  . metoprolol succinate (TOPROL-XL) 25 MG 24 hr  tablet Take 1 tablet (25 mg total) by mouth daily. 90 tablet 1  . ONE TOUCH ULTRA TEST test strip USE TO CHECK BLOOD SUGAR DAILY AND PRN 100 each 4  . ONETOUCH DELICA LANCETS 76H MISC USE TO CHECK BLOOD SUGAR DAILY AND PRN 100 each 4  . predniSONE (DELTASONE) 20 MG tablet 2 tabs for 6 days, 2 tabs for 4 days, 1 tabs for 3 days, and 1/2 tab for 3 days. Take tables together with breakfast. 25 tablet 0  . sertraline (ZOLOFT) 50 MG tablet Take 0.5 tablets (25 mg total) by mouth daily. 30 tablet 1  . Spacer/Aero-Holding Chambers (AEROCHAMBER PLUS) inhaler Use as instructed to use with inahaler. 1 each 1  . tamsulosin (FLOMAX) 0.4 MG CAPS capsule Take 1 capsule (0.4 mg total) by mouth daily after supper. 90 capsule 1  . timolol (TIMOPTIC-XR) 0.5 % ophthalmic gel-forming     .  valsartan (DIOVAN) 320 MG tablet Take 1 tablet (320 mg total) by mouth daily. 90 tablet 1   No current facility-administered medications on file prior to visit.      Past Medical History:  Diagnosis Date  . BACK PAIN, CHRONIC 04/18/2009  . BENIGN PROSTATIC HYPERTROPHY, HX OF 01/19/2007  . Carpal tunnel syndrome 03/24/2007  . CORONARY ARTERY DISEASE 01/19/2007  . DEPRESSION, CHRONIC 06/24/2007  . DIABETES MELLITUS 01/19/2007  . DIABETES MELLITUS, TYPE II, WITH NEUROLOGICAL COMPLICATIONS 05/20/348  . Diarrhea 02/23/2007  . HEART MURMUR, SYSTOLIC 0/93/8182  . HERPES SIMPLEX INFECTION 01/19/2007  . HYPERLIPIDEMIA 01/19/2007  . HYPERTENSION 01/19/2007  . Intermediate coronary syndrome ([place]) 08/29/2008  . LEG CRAMPS, NOCTURNAL 10/28/2007  . Leg cramps, sleep related 08/05/2010  . OBSTRUCTIVE SLEEP APNEA 01/19/2007   does not use CPAP  . OSTEOARTHRITIS 06/24/2007  . Other postprocedural status(V45.89) 01/19/2007  . PERCUTANEOUS TRANSLUMINAL CORONARY ANGIOPLASTY, HX OF 01/19/2007  . PLANTAR FASCIITIS, RIGHT 01/19/2007  . SHOULDER PAIN, LEFT 03/24/2007  . SPINAL STENOSIS, LUMBAR 06/03/2009  . TINEA PEDIS 11/15/2009  . URI 01/11/2009   No  Known Allergies  Social History   Social History  . Marital status: Married    Spouse name: N/A  . Number of children: N/A  . Years of education: N/A   Occupational History  . truck Educational psychologist for [employer]  . Smoking status: Former Smoker    Quit date: 04/14/1983  . Smokeless tobacco: Never Used     Comment: stopped 1985  . Alcohol use Yes     Comment: 2-3 times a week - wine, scotch  . Drug use: No  . Sexual activity: Not Asked   Other Topics Concern  . None   Social History Narrative   Married in 1959 - 6yrs, divorced; married 1974 - 1.5 years, divorced; married 1985 1 daughter 9; 69 step daughters; 2 grandchildren.    Vitals:   10/30/16 0933  BP: 124/78  Pulse: 86  Resp: 12  O2 sat at RA 93% Body mass index is 29.64 kg/m.   Wt Readings from Last 3 Encounters:  10/30/16 178 lb 2 oz (80.8 kg)  10/09/16 181 lb 2 oz (82.2 kg)  10/05/16 183 lb 2 oz (83.1 kg)     Physical Exam  Nursing note and vitals reviewed. Constitutional: He is oriented to person, place, and time. He appears well-developed. No distress.  HENT:  Head: Atraumatic.  Mouth/Throat: Oropharynx is clear and moist and mucous membranes are normal.  Eyes: Pupils are equal, round, and reactive to light. Conjunctivae and EOM are normal.  Cardiovascular: Normal rate and regular rhythm.   Murmur (SEM II/VI RUSB-RUSB) heard. DP pulses present bilateral.  Respiratory: Effort normal and breath sounds normal. No respiratory distress.  GI: Soft. He exhibits no mass. There is no hepatomegaly. There is no tenderness.  Musculoskeletal: He exhibits edema (1+).       Lumbar back: He exhibits no tenderness and no bony tenderness.  Knee crepitus bilateral with limitation of flexion, R>L. Antalgic gait.   Lymphadenopathy:    He has no cervical adenopathy.  Neurological: He is alert and oriented to person, place, and time.  No focal deficit appreciated. Mildly  unstable gait when he first gets up. Gait assisted by a cane.  Skin: Skin is warm. No erythema.  Psychiatric: His mood appears anxious.  Well groomed, good eye contact.     ASSESSMENT AND PLAN:   [name] was seen today for follow-up.  Diagnoses and all orders for this visit:  Lab Results  Component Value Date   HGBA1C 7.6 (H) 10/30/2016   Lab Results  Component Value Date   CREATININE 0.72 10/30/2016   BUN 18 10/30/2016   NA 142 10/30/2016   K 3.9 10/30/2016   CL 106 10/30/2016   CO2 28 10/30/2016    Diabetes mellitus type 2 with neurological manifestations ([place])  HgA1C pending. No changes in current management, will adjust treatment accordingly. Regular exercise, low impact as tolerated and healthy diet with avoidance of added sugar food intake is an important part of treatment and recommended. Eye exam is overdue. F/U in 5-6 months  -     Basic metabolic panel -     Hemoglobin A1c -     Fructosamine  Hypertension with heart disease  Adequately controlled. No changes in current management. DASH diet recommended.  F/U in 6 months, before if needed.  -     Basic metabolic panel  Irritable bowel syndrome with diarrhea  Some side effects of Lomotil discussed, strongly recommend taking medication as instructed. Adequate hydration. Avoid trigger factors he has already has identified.  COPD exacerbation ([place])  Acute exacerbation resolved. Continue Albuterol inh prn. F/U as needed.  BPH associated with nocturia  Stable. No changes in current management. F/U in 12 months.  Cramp of limb  Chronic. Some side effects of Methocarbamol discussed, it has helped. Gabapentin night dose increased from 300 mg to 600 mg, rest unchanged. F/U in 6 weeks.   -     methocarbamol (ROBAXIN) 500 MG tablet; Take 1 tablet (500 mg total) by mouth 3 (three) times daily as needed for muscle spasms.  Depression, major, recurrent, moderate ([place])  He would like to try  Sertraline, side effects reviewed. Instructed about warning signs. F/U in 6 weeks,before if needed.   SPINAL STENOSIS, LUMBAR  Gabapentin dose increased. Methocarbamol to continue. Fall precautions discussed.  -     methocarbamol (ROBAXIN) 500 MG tablet; Take 1 tablet (500 mg total) by mouth 3 (three) times daily as needed for muscle spasms.   Chronic fatigue  Hx of OSA,insomnia,chroic pain, and depression. All these can contribute to problem.     [name]. [name], MD  [facility]. [place] office.

## 2016-10-30 ENCOUNTER — Ambulatory Visit (INDEPENDENT_AMBULATORY_CARE_PROVIDER_SITE_OTHER): Payer: PPO | Admitting: Family Medicine

## 2016-10-30 ENCOUNTER — Telehealth: Payer: Self-pay

## 2016-10-30 ENCOUNTER — Encounter: Payer: Self-pay | Admitting: Family Medicine

## 2016-10-30 VITALS — BP 124/78 | HR 86 | Resp 12 | Ht 65.0 in | Wt 178.1 lb

## 2016-10-30 DIAGNOSIS — G629 Polyneuropathy, unspecified: Secondary | ICD-10-CM | POA: Insufficient documentation

## 2016-10-30 DIAGNOSIS — R351 Nocturia: Secondary | ICD-10-CM | POA: Diagnosis not present

## 2016-10-30 DIAGNOSIS — F331 Major depressive disorder, recurrent, moderate: Secondary | ICD-10-CM

## 2016-10-30 DIAGNOSIS — N401 Enlarged prostate with lower urinary tract symptoms: Secondary | ICD-10-CM

## 2016-10-30 DIAGNOSIS — E1149 Type 2 diabetes mellitus with other diabetic neurological complication: Secondary | ICD-10-CM | POA: Diagnosis not present

## 2016-10-30 DIAGNOSIS — M48061 Spinal stenosis, lumbar region without neurogenic claudication: Secondary | ICD-10-CM

## 2016-10-30 DIAGNOSIS — J441 Chronic obstructive pulmonary disease with (acute) exacerbation: Secondary | ICD-10-CM

## 2016-10-30 DIAGNOSIS — K580 Irritable bowel syndrome with diarrhea: Secondary | ICD-10-CM

## 2016-10-30 DIAGNOSIS — R252 Cramp and spasm: Secondary | ICD-10-CM

## 2016-10-30 DIAGNOSIS — I119 Hypertensive heart disease without heart failure: Secondary | ICD-10-CM

## 2016-10-30 DIAGNOSIS — R5382 Chronic fatigue, unspecified: Secondary | ICD-10-CM

## 2016-10-30 LAB — BASIC METABOLIC PANEL
BUN: 18 mg/dL (ref 6–23)
CALCIUM: 9 mg/dL (ref 8.4–10.5)
CO2: 28 meq/L (ref 19–32)
CREATININE: 0.72 mg/dL (ref 0.40–1.50)
Chloride: 106 mEq/L (ref 96–112)
GFR: 110.89 mL/min (ref >60.00)
Glucose, Bld: 219 mg/dL — ABNORMAL HIGH (ref 70–99)
Potassium: 3.9 mEq/L (ref 3.5–5.1)
Sodium: 142 mEq/L (ref 135–145)

## 2016-10-30 LAB — HEMOGLOBIN A1C: Hgb A1c MFr Bld: 7.6 % — ABNORMAL HIGH (ref 4.6–6.5)

## 2016-10-30 MED ORDER — METHOCARBAMOL 500 MG PO TABS: 500.0000 mg | ORAL_TABLET | Freq: Three times a day (TID) | ORAL | 1 refills | Status: DC | PRN

## 2016-10-30 NOTE — Patient Instructions (Signed)
A few things to remember from today's visit:   Diabetes mellitus type 2 with neurological manifestations ([name]) - Plan: Basic metabolic panel, Hemoglobin A1c, Fructosamine  Hypertension with heart disease  Irritable bowel syndrome with diarrhea  COPD exacerbation (HCC)  Gabapentin 600 mg at night rest doses the same. Continue Methocarbamol.  Start Sertraline.  DASH Eating Plan DASH stands for "Dietary Approaches to Stop Hypertension." The DASH eating plan is a healthy eating plan that has been shown to reduce high blood pressure (hypertension). It may also reduce your risk for type 2 diabetes, heart disease, and stroke. The DASH eating plan may also help with weight loss. What are tips for following this plan? General guidelines  Avoid eating more than 2,300 mg (milligrams) of salt (sodium) a day. If you have hypertension, you may need to reduce your sodium intake to 1,500 mg a day.  Limit alcohol intake to no more than 1 drink a day for nonpregnant women and 2 drinks a day for men. One drink equals 12 oz of beer, 5 oz of wine, or 1 oz of hard liquor.  Work with your health care provider to maintain a healthy body weight or to lose weight. Ask what an ideal weight is for you.  Get at least 30 minutes of exercise that causes your heart to beat faster (aerobic exercise) most days of the week. Activities may include walking, swimming, or biking.  Work with your health care provider or diet and nutrition specialist (dietitian) to adjust your eating plan to your individual calorie needs. Reading food labels  Check food labels for the amount of sodium per serving. Choose foods with less than 5 percent of the Daily Value of sodium. Generally, foods with less than 300 mg of sodium per serving fit into this eating plan.  To find whole grains, look for the word "whole" as the first word in the ingredient list. Shopping  Buy products labeled as "low-sodium" or "no salt added."  Buy fresh  foods. Avoid canned foods and premade or frozen meals. Cooking  Avoid adding salt when cooking. Use salt-free seasonings or herbs instead of table salt or sea salt. Check with your health care provider or pharmacist before using salt substitutes.  Do not fry foods. Cook foods using healthy methods such as baking, boiling, grilling, and broiling instead.  Cook with heart-healthy oils, such as olive, canola, soybean, or sunflower oil. Meal planning   Eat a balanced diet that includes: ? 5 or more servings of fruits and vegetables each day. At each meal, try to fill half of your plate with fruits and vegetables. ? Up to 6-8 servings of whole grains each day. ? Less than 6 oz of lean meat, poultry, or fish each day. A 3-oz serving of meat is about the same size as a deck of cards. One egg equals 1 oz. ? 2 servings of low-fat dairy each day. ? A serving of nuts, seeds, or beans 5 times each week. ? Heart-healthy fats. Healthy fats called Omega-3 fatty acids are found in foods such as flaxseeds and coldwater fish, like sardines, salmon, and mackerel.  Limit how much you eat of the following: ? Canned or prepackaged foods. ? Food that is high in trans fat, such as fried foods. ? Food that is high in saturated fat, such as fatty meat. ? Sweets, desserts, sugary drinks, and other foods with added sugar. ? Full-fat dairy products.  Do not salt foods before eating.  Try to eat at  least 2 vegetarian meals each week.  Eat more home-cooked food and less restaurant, buffet, and fast food.  When eating at a restaurant, ask that your food be prepared with less salt or no salt, if possible. What foods are recommended? The items listed may not be a complete list. Talk with your dietitian about what dietary choices are best for you. Grains Whole-grain or whole-wheat bread. Whole-grain or whole-wheat pasta. Brown rice. [name]. Bulgur. Whole-grain and low-sodium cereals. Pita bread. Low-fat,  low-sodium crackers. Whole-wheat flour tortillas. Vegetables Fresh or frozen vegetables (raw, steamed, roasted, or grilled). Low-sodium or reduced-sodium tomato and vegetable juice. Low-sodium or reduced-sodium tomato sauce and tomato paste. Low-sodium or reduced-sodium canned vegetables. Fruits All fresh, dried, or frozen fruit. Canned fruit in natural juice (without added sugar). Meat and other protein foods Skinless chicken or [place]. Ground chicken or [place]. Pork with fat trimmed off. Fish and seafood. Egg whites. Dried beans, peas, or lentils. Unsalted nuts, nut butters, and seeds. Unsalted canned beans. Lean cuts of beef with fat trimmed off. Low-sodium, lean deli meat. Dairy Low-fat (1%) or fat-free (skim) milk. Fat-free, low-fat, or reduced-fat cheeses. Nonfat, low-sodium ricotta or cottage cheese. Low-fat or nonfat yogurt. Low-fat, low-sodium cheese. Fats and oils Soft margarine without trans fats. Vegetable oil. Low-fat, reduced-fat, or light mayonnaise and salad dressings (reduced-sodium). Canola, safflower, olive, soybean, and sunflower oils. Avocado. Seasoning and other foods Herbs. Spices. Seasoning mixes without salt. Unsalted popcorn and pretzels. Fat-free sweets. What foods are not recommended? The items listed may not be a complete list. Talk with your dietitian about what dietary choices are best for you. Grains Baked goods made with fat, such as croissants, muffins, or some breads. Dry pasta or rice meal packs. Vegetables Creamed or fried vegetables. Vegetables in a cheese sauce. Regular canned vegetables (not low-sodium or reduced-sodium). Regular canned tomato sauce and paste (not low-sodium or reduced-sodium). Regular tomato and vegetable juice (not low-sodium or reduced-sodium). [name]. Olives. Fruits Canned fruit in a light or heavy syrup. Fried fruit. Fruit in cream or butter sauce. Meat and other protein foods Fatty cuts of meat. Ribs. Fried meat. [name]. Sausage.  Bologna and other processed lunch meats. Salami. Fatback. Hotdogs. Bratwurst. Salted nuts and seeds. Canned beans with added salt. Canned or smoked fish. Whole eggs or egg yolks. Chicken or [place] with skin. Dairy Whole or 2% milk, cream, and half-and-half. Whole or full-fat cream cheese. Whole-fat or sweetened yogurt. Full-fat cheese. Nondairy creamers. Whipped toppings. Processed cheese and cheese spreads. Fats and oils Butter. Stick margarine. Lard. Shortening. Ghee. Bacon fat. Tropical oils, such as coconut, palm kernel, or palm oil. Seasoning and other foods Salted popcorn and pretzels. Onion salt, garlic salt, seasoned salt, table salt, and sea salt. Worcestershire sauce. Tartar sauce. Barbecue sauce. Teriyaki sauce. Soy sauce, including reduced-sodium. Steak sauce. Canned and packaged gravies. Fish sauce. Oyster sauce. Cocktail sauce. Horseradish that you find on the shelf. Ketchup. Mustard. Meat flavorings and tenderizers. Bouillon cubes. Hot sauce and Tabasco sauce. Premade or packaged marinades. Premade or packaged taco seasonings. Relishes. Regular salad dressings. Where to find more information:  National Heart, Lung, and [place]: https://wilson-eaton.com/  American Heart Association: www.heart.org Summary  The DASH eating plan is a healthy eating plan that has been shown to reduce high blood pressure (hypertension). It may also reduce your risk for type 2 diabetes, heart disease, and stroke.  With the DASH eating plan, you should limit salt (sodium) intake to 2,300 mg a day. If you have hypertension, you  may need to reduce your sodium intake to 1,500 mg a day.  When on the DASH eating plan, aim to eat more fresh fruits and vegetables, whole grains, lean proteins, low-fat dairy, and heart-healthy fats.  Work with your health care provider or diet and nutrition specialist (dietitian) to adjust your eating plan to your individual calorie needs. This information is not intended to  replace advice given to you by your health care provider. Make sure you discuss any questions you have with your health care provider. Document Released: 03/19/2011 Document Revised: 03/23/2016 Document Reviewed: 03/23/2016 Elsevier Interactive Patient Education  2017 [employer].   Please be sure medication list is accurate. If a new problem present, please set up appointment sooner than planned today.

## 2016-10-30 NOTE — Telephone Encounter (Signed)
Received PA request for Methocarbamol. PA submitted & pending. Key: [id number]

## 2016-11-01 ENCOUNTER — Encounter: Payer: Self-pay | Admitting: Family Medicine

## 2016-11-01 ENCOUNTER — Other Ambulatory Visit: Payer: Self-pay | Admitting: Family Medicine

## 2016-11-01 DIAGNOSIS — E1140 Type 2 diabetes mellitus with diabetic neuropathy, unspecified: Secondary | ICD-10-CM

## 2016-11-01 MED ORDER — GLIPIZIDE 5 MG PO TABS: 5.0000 mg | ORAL_TABLET | Freq: Two times a day (BID) | ORAL | 3 refills | Status: DC

## 2016-11-02 LAB — FRUCTOSAMINE: Fructosamine: 259 umol/L (ref 190–270)

## 2016-11-02 NOTE — Telephone Encounter (Signed)
PA approved, form faxed back to pharmacy. 

## 2016-11-20 ENCOUNTER — Encounter: Payer: Self-pay | Admitting: Family Medicine

## 2016-11-25 DIAGNOSIS — L308 Other specified dermatitis: Secondary | ICD-10-CM | POA: Diagnosis not present

## 2016-11-25 DIAGNOSIS — X32XXXA Exposure to sunlight, initial encounter: Secondary | ICD-10-CM | POA: Diagnosis not present

## 2016-11-25 DIAGNOSIS — L821 Other seborrheic keratosis: Secondary | ICD-10-CM | POA: Diagnosis not present

## 2016-11-25 DIAGNOSIS — L720 Epidermal cyst: Secondary | ICD-10-CM | POA: Diagnosis not present

## 2016-11-25 DIAGNOSIS — L570 Actinic keratosis: Secondary | ICD-10-CM | POA: Diagnosis not present

## 2016-12-31 ENCOUNTER — Encounter: Payer: Self-pay | Admitting: Family Medicine

## 2017-01-01 ENCOUNTER — Telehealth: Payer: Self-pay

## 2017-01-01 ENCOUNTER — Other Ambulatory Visit: Payer: Self-pay

## 2017-01-01 ENCOUNTER — Encounter: Payer: Self-pay | Admitting: Family Medicine

## 2017-01-01 DIAGNOSIS — R252 Cramp and spasm: Secondary | ICD-10-CM

## 2017-01-01 DIAGNOSIS — M48061 Spinal stenosis, lumbar region without neurogenic claudication: Secondary | ICD-10-CM

## 2017-01-01 MED ORDER — GABAPENTIN 300 MG PO CAPS: 300.0000 mg | ORAL_CAPSULE | Freq: Three times a day (TID) | ORAL | 1 refills | Status: DC

## 2017-01-01 NOTE — Telephone Encounter (Signed)
[facility] called to advise that Methocarbamol 500mg  is on back order.   They would like to know what you would like to do.  Dr. [name] - Please advise.

## 2017-01-05 MED ORDER — TIZANIDINE HCL 2 MG PO TABS: ORAL_TABLET | ORAL | 2 refills | Status: DC

## 2017-01-05 NOTE — Telephone Encounter (Signed)
If [name] agrees we can try Zanaflex 2-4 mg tid as needed. Thanks

## 2017-01-05 NOTE — Telephone Encounter (Signed)
I sent patient a mychart message and have sent the new prescription to [facility].

## 2017-01-14 NOTE — Progress Notes (Signed)
HPI:   [name] is a 83 y.o. male, who is here today with his wife to follow on some chronic medical problems.  He was last seen on 10/30/16. Hx of chronic pain, generalized OA, depression,DM II, diastolic dysfunction,chronic fatigue, OSA (did not tolerate CPAP), and HTN among some.  Last OV he agreed with trying Zoloft 25 mg daily. A few weeks ago he emailed me reporting no major improvement with Zoloft 25 mg,so recommend increasing dose from 25 mg to 50 mg, he did continue 25 mg instead. He think his anxiety is better, he is accepting some of his limitations in regard to mobility. He is also on Xanax 0.25 mg daily as needed. He denies suicidal thoughts.   He takes gabapentin for radicular lower back pain (spinal stenosis) and peripheral neuropathy. He is not longer following with chronic pain management because of the cost, he is also not sure Tramadol was helping with pain.  Muscle cramps, Methocarbamol was initially prescribed by pain management and he felt like it was helping, so it was refilled last OV.There is a recent shortage, so Zanaflex was sent 12/2016. Problem is stable otherwise.  IBS-D: He is on Lomotil tid, which has helped with chronic diarrhea.  He is not sure about how often he has loose stools but states that sometimes it seems to be worse. He wonders if there is something stronger to help with diarrhea. He denies blood in the stool. He has associated mild abdominal cramps, usually right before defecation.  He is also complaining about urinary frequency, which is a chronic problem, Hx of BPH. He doesn't want to continue following with urologist. He is on Flomax and Proscar. Cardura 2 mg added at bedtime about a year ago has helped with nocturia but during the day he is still having frequency and urgency.He is on Furosemide, which aggravate problem. States that usually problem is worse when he is home during the day but if goes ut for 3-4 hours he does  not fel the need to void.  He denies gross hematuria or foam in urine.   Hypertension:   He discontinued Valsartan when med was recalled. He is on Metoprolol Succinate 25 mg daily  BP at home "a little high."  She has not noted unusual headache, visual changes, exertional chest pain or worsening dyspnea,  focal weakness. LE edema is stable, Hx of diastolic dysfunction. He denies orthopnea or PND, sleeping on 2 pillows (a big one and small one), at his baseline.   Lab Results  Component Value Date   CREATININE 0.72 10/30/2016   BUN 18 10/30/2016   NA 142 10/30/2016   K 3.9 10/30/2016   CL 106 10/30/2016   CO2 28 10/30/2016   Hx of COPD, "sometimes" he feels SOB with mild to moderate physical activity, "little" wheezing occasionally. He denies fever,chills,or worsening cough. He does not use Albuterol inhaler as instructed.  He is also c/o persistent very pruritic rash, initially noted on LE's a few months ago. After failing several topical treatments I recommended dermatology evaluation. Appt with derma 01/30/17 but he went to see another dermatologist recently, prescribed a "itching cream" OTC and another topical prescription medication but he does not remember name. He has noted new lesions on abdomen and back. No sick contact. Pruritus is affecting his sleep.    Review of Systems  Constitutional: Positive for fatigue. Negative for activity change, appetite change, chills and fever.  HENT: Positive for hearing loss (chronic/hearing  aids). Negative for mouth sores, nosebleeds, sore throat and trouble swallowing.   Eyes: Negative for redness and visual disturbance.  Respiratory: Positive for shortness of breath and wheezing. Negative for cough.   Cardiovascular: Positive for leg swelling. Negative for chest pain and palpitations.  Gastrointestinal: Positive for diarrhea. Negative for abdominal pain, blood in stool, nausea and vomiting.  Endocrine: Negative for cold  intolerance, heat intolerance, polydipsia and polyphagia.  Genitourinary: Positive for frequency and urgency. Negative for decreased urine volume, dysuria, flank pain and hematuria.  Musculoskeletal: Positive for arthralgias, gait problem and myalgias.  Skin: Negative for pallor, rash and wound.  Allergic/Immunologic: Positive for environmental allergies.  Neurological: Negative for syncope, speech difficulty, weakness and headaches.  Psychiatric/Behavioral: Positive for sleep disturbance. Negative for confusion and hallucinations. The patient is nervous/anxious.       Current Outpatient Prescriptions on File Prior to Visit  Medication Sig Dispense Refill  . ALPRAZolam (XANAX) 0.25 MG tablet TAKE 1/2 - 1 TABLET BY MOUTH AT BEDTIME AS NEEDED 30 tablet 1  . aspirin 81 MG EC tablet Take 81 mg by mouth daily.     . diclofenac sodium (VOLTAREN) 1 % GEL Apply 4 g topically 4 (four) times daily. 400 g 2  . diphenoxylate-atropine (LOMOTIL) 2.5-0.025 MG tablet Take 1 tablet by mouth 3 (three) times daily as needed for diarrhea or loose stools. 90 tablet 3  . finasteride (PROSCAR) 5 MG tablet Take 1 tablet (5 mg total) by mouth daily. 90 tablet 2  . furosemide (LASIX) 40 MG tablet Take 1 tab (40 mg total) by mouth daily. You may take 1 extra lasix in the evening if you have SOB, LEE, and weight gain > 3lbs in 24 hours 180 tablet 1  . gabapentin (NEURONTIN) 300 MG capsule Take 1 capsule (300 mg total) by mouth 3 (three) times daily. 270 capsule 1  . glipiZIDE (GLUCOTROL) 5 MG tablet Take 1 tablet (5 mg total) by mouth 2 (two) times daily before a meal. 60 tablet 3  . Glucose Blood (FREESTYLE LITE TEST VI)     . methocarbamol (ROBAXIN) 500 MG tablet Take 1 tablet (500 mg total) by mouth 3 (three) times daily as needed for muscle spasms. 90 tablet 1  . metoprolol succinate (TOPROL-XL) 25 MG 24 hr tablet Take 1 tablet (25 mg total) by mouth daily. 90 tablet 1  . ONE TOUCH ULTRA TEST test strip USE TO CHECK  BLOOD SUGAR DAILY AND PRN 100 each 4  . ONETOUCH DELICA LANCETS 81W MISC USE TO CHECK BLOOD SUGAR DAILY AND PRN 100 each 4  . tamsulosin (FLOMAX) 0.4 MG CAPS capsule Take 1 capsule (0.4 mg total) by mouth daily after supper. 90 capsule 1  . timolol (TIMOPTIC-XR) 0.5 % ophthalmic gel-forming     . tiZANidine (ZANAFLEX) 2 MG tablet Take 2-4 mg by mouth three times daily as needed. 90 tablet 2   No current facility-administered medications on file prior to visit.      Past Medical History:  Diagnosis Date  . BACK PAIN, CHRONIC 04/18/2009  . BENIGN PROSTATIC HYPERTROPHY, HX OF 01/19/2007  . Carpal tunnel syndrome 03/24/2007  . CORONARY ARTERY DISEASE 01/19/2007  . DEPRESSION, CHRONIC 06/24/2007  . DIABETES MELLITUS 01/19/2007  . DIABETES MELLITUS, TYPE II, WITH NEUROLOGICAL COMPLICATIONS 05/22/9369  . Diarrhea 02/23/2007  . HEART MURMUR, SYSTOLIC 6/96/7893  . HERPES SIMPLEX INFECTION 01/19/2007  . HYPERLIPIDEMIA 01/19/2007  . HYPERTENSION 01/19/2007  . Intermediate coronary syndrome ([place]) 08/29/2008  . LEG CRAMPS, NOCTURNAL  10/28/2007  . Leg cramps, sleep related 08/05/2010  . OBSTRUCTIVE SLEEP APNEA 01/19/2007   does not use CPAP  . OSTEOARTHRITIS 06/24/2007  . Other postprocedural status(V45.89) 01/19/2007  . PERCUTANEOUS TRANSLUMINAL CORONARY ANGIOPLASTY, HX OF 01/19/2007  . PLANTAR FASCIITIS, RIGHT 01/19/2007  . SHOULDER PAIN, LEFT 03/24/2007  . SPINAL STENOSIS, LUMBAR 06/03/2009  . TINEA PEDIS 11/15/2009  . URI 01/11/2009   Past Surgical History:  Procedure Laterality Date  . arthroscopy, knee of hx    . CARDIAC CATHETERIZATION    . COLONOSCOPY    . inguinal herniorrhapy, hx    . NASAL SINUS SURGERY    . percutaneous transluminal coronary angioplasty, hx of  2004   Dr. Lyndel Safe  . plastic joint in thumb    . PTCA/stent  2004  . TONSILLECTOMY    . VASECTOMY      No Known Allergies  Family History  Problem Relation Age of Onset  . Diabetes Other   . Coronary artery disease Other      Social History   Social History  . Marital status: Married    Spouse name: N/A  . Number of children: N/A  . Years of education: N/A   Occupational History  . truck Educational psychologist for [employer]  . Smoking status: Former Smoker    Quit date: 04/14/1983  . Smokeless tobacco: Never Used     Comment: stopped 1985  . Alcohol use Yes     Comment: 2-3 times a week - wine, scotch  . Drug use: No  . Sexual activity: Not Asked   Other Topics Concern  . None   Social History Narrative   Married in 1959 - 59yrs, divorced; married 1974 - 1.5 years, divorced; married 1985 1 daughter 39; 38 step daughters; 2 grandchildren.    Vitals:   01/15/17 1433 01/15/17 1527  BP: 130/90   Pulse: 88   Resp: 12   SpO2: 92% 97%   Body mass index is 29.97 kg/m.  Wt Readings from Last 3 Encounters:  01/15/17 180 lb 2 oz (81.7 kg)  10/30/16 178 lb 2 oz (80.8 kg)  10/09/16 181 lb 2 oz (82.2 kg)     Physical Exam  Nursing note and vitals reviewed. Constitutional: He is oriented to person, place, and time. He appears well-developed. No distress.  HENT:  Head: Normocephalic and atraumatic.  Mouth/Throat: Oropharynx is clear and moist and mucous membranes are normal.  Eyes: Pupils are equal, round, and reactive to light. Conjunctivae are normal.  Neck: No JVD present.  Cardiovascular: Normal rate and regular rhythm.   Murmur (SEM II/VI RUSB-LUSB) heard. Pulses:      Dorsalis pedis pulses are 2+ on the right side, and 2+ on the left side.  Respiratory: Effort normal and breath sounds normal. No respiratory distress.  GI: Soft. He exhibits no mass. There is no hepatomegaly. There is no tenderness.  Musculoskeletal: He exhibits edema (2+ pitting LE edema,bilateral.). He exhibits no tenderness.  Antalgic gait.  Lymphadenopathy:    He has no cervical adenopathy.  Neurological: He is alert and oriented to person, place, and time.  No focal deficit  appreciated. Mildly unstable gait assisted with a cane.  Skin: Skin is warm. Rash noted. Rash is maculopapular. No erythema.  Maculopapular erythematous lesions, rounded with clear center on abdomen and back. RLE more confluent rash, micropapular.  Psychiatric: His mood appears anxious. Cognition and memory are normal. He expresses no suicidal ideation.  Well groomed, good eye contact.    ASSESSMENT AND PLAN:   Mr. Bonny was seen today for follow-up.  Diagnoses and all orders for this visit:  Lab Results  Component Value Date   CREATININE 0.81 01/15/2017   BUN 17 01/15/2017   NA 144 01/15/2017   K 3.8 01/15/2017   CL 104 01/15/2017   CO2 27 01/15/2017   Hypertension with heart disease  In general adequate reading here in the office but reported as high at home. I recommend adding another ARB, Losartan low dose. No changes in Metoprolol Succinate. DASH-low salt diet also recommended. Continue monitoring BP at home. F/U in 4-6 weeks.  -     losartan (COZAAR) 25 MG tablet; Take 1 tablet (25 mg total) by mouth daily.  Cramp of both lower extremities  Stable. Continue Gabapentin and Zanaflex. Fall precautions discussed.  Depression, major, recurrent, moderate (HCC)  No significant different with current Zoloft dose, so increase to 50 mg daily. We discussed side effects and instructed about warning signs.  F/U in 4 weeks, before if needed.  -     sertraline (ZOLOFT) 50 MG tablet; Take 1 tablet (50 mg total) by mouth daily.  Chronic diarrhea  He has not identified exacerbating factors,did not improve after Metformin was discontinued.  No changes in Lomotil doses, side effects discussed. GI evaluation to discuss other treatment options but he is not interested in GI evaluation. Adequate hydration.  BPH without urinary obstruction  Doxazosin at bedtime helped with nocturia, we will try adding another dose in the morning, so increased to 2 mg bid. Side effects  discussed. No changes in Proscar and Flomax. F/U in 4 weeks.  -     doxazosin (CARDURA) 2 MG tablet; Take 1 tablet (2 mg total) by mouth 2 (two) times daily.  Diastolic congestive heart failure, unspecified HF chronicity (HCC)  Denies orthopnea or PND and LE edema stable. He has not tolerated high doses of Furosemide due to urinary frequency. Continue Metoprolol, Cozaar added. Continue low salt diet.  Last Echo 08/2015: The cavity size was normal. Systolic function was normal. Wall motion was normal; there were no regional wall motion abnormalities. Doppler parameters are consistent with abnormal left ventricular relaxation (grade 1 diastolic dysfunction). Doppler parameters are consistent with high ventricular filling pressure.  -     Basic metabolic panel -     Brain Natriuretic Peptide  Anxiety disorder, unspecified type  Improved some. Zoloft increased from 25 mg to 50 mg. No changes in Xanax. Side effects of medications discussed. F/U in 4 weeks, when I will consider Remeron or other options if symptoms of depression and anxiety are not better.  -     sertraline (ZOLOFT) 50 MG tablet; Take 1 tablet (50 mg total) by mouth daily.  Pruritic erythematous rash  Unknown etiology. ? Fungal but intense pruritus is not usually a main feature for tinea. ? Eczema, has not responded to topical steroids. He will try topical Ketoconazole. Keep appt with derma, 01/30/17 and take all meds with him to his appt.  -     ketoconazole (NIZORAL) 2 % cream; Apply 1 application topically daily.  Chronic obstructive pulmonary disease, unspecified COPD type ([place])  Today no wheezing or respiratory distress. This could be contributing to exertional dyspnea, otherwise stable. He has not been compliant with using inhalers. Recommend using Albuterol inh as needed or before planning prolonged walking.  Need for influenza vaccination -     Flu vaccine HIGH DOSE PF  40 min face to face OV. > 50% was  dedicated to discussion of Dx, prognosis, treatment options, and some side effects of medications. Some of his medications can increase risk of falls as well as chronic generalized pain,peripheral neuropathy,and unstable gait. He has Hx of OSA and has not tolerated CPAP in the past. We discussed the importance of compliance with medications.    -Mr. YAMIN SWINGLER was advised to return sooner than planned today if new concerns arise.       Kallie Depolo G. [name], MD  [facility]. [place] office.

## 2017-01-15 ENCOUNTER — Encounter: Payer: Self-pay | Admitting: Family Medicine

## 2017-01-15 ENCOUNTER — Ambulatory Visit (INDEPENDENT_AMBULATORY_CARE_PROVIDER_SITE_OTHER): Payer: PPO | Admitting: Family Medicine

## 2017-01-15 VITALS — BP 130/90 | HR 88 | Resp 12 | Ht 65.0 in | Wt 180.1 lb

## 2017-01-15 DIAGNOSIS — F419 Anxiety disorder, unspecified: Secondary | ICD-10-CM

## 2017-01-15 DIAGNOSIS — K529 Noninfective gastroenteritis and colitis, unspecified: Secondary | ICD-10-CM | POA: Diagnosis not present

## 2017-01-15 DIAGNOSIS — N400 Enlarged prostate without lower urinary tract symptoms: Secondary | ICD-10-CM

## 2017-01-15 DIAGNOSIS — J449 Chronic obstructive pulmonary disease, unspecified: Secondary | ICD-10-CM

## 2017-01-15 DIAGNOSIS — R252 Cramp and spasm: Secondary | ICD-10-CM

## 2017-01-15 DIAGNOSIS — F331 Major depressive disorder, recurrent, moderate: Secondary | ICD-10-CM | POA: Diagnosis not present

## 2017-01-15 DIAGNOSIS — I110 Hypertensive heart disease with heart failure: Secondary | ICD-10-CM

## 2017-01-15 DIAGNOSIS — Z23 Encounter for immunization: Secondary | ICD-10-CM | POA: Diagnosis not present

## 2017-01-15 DIAGNOSIS — L298 Other pruritus: Secondary | ICD-10-CM | POA: Diagnosis not present

## 2017-01-15 DIAGNOSIS — I5030 Unspecified diastolic (congestive) heart failure: Secondary | ICD-10-CM

## 2017-01-15 DIAGNOSIS — I119 Hypertensive heart disease without heart failure: Secondary | ICD-10-CM

## 2017-01-15 MED ORDER — KETOCONAZOLE 2 % EX CREA: 1.0000 "application " | TOPICAL_CREAM | Freq: Every day | CUTANEOUS | 0 refills | Status: DC

## 2017-01-15 MED ORDER — DOXAZOSIN MESYLATE 2 MG PO TABS: 2.0000 mg | ORAL_TABLET | Freq: Two times a day (BID) | ORAL | 2 refills | Status: DC

## 2017-01-15 MED ORDER — SERTRALINE HCL 50 MG PO TABS: 50.0000 mg | ORAL_TABLET | Freq: Every day | ORAL | 1 refills | Status: DC

## 2017-01-15 MED ORDER — LOSARTAN POTASSIUM 25 MG PO TABS: 25.0000 mg | ORAL_TABLET | Freq: Every day | ORAL | 2 refills | Status: DC

## 2017-01-15 NOTE — Patient Instructions (Addendum)
A few things to remember from today's visit:   Cramp of both lower extremities  Depression, major, recurrent, moderate ([place]) - Plan: sertraline (ZOLOFT) 50 MG tablet  Chronic diarrhea  Need for influenza vaccination - Plan: Flu vaccine HIGH DOSE PF  Diastolic congestive heart failure, unspecified HF chronicity ([place]) - Plan: Basic metabolic panel, Brain Natriuretic Peptide  Anxiety disorder, unspecified type - Plan: sertraline (ZOLOFT) 50 MG tablet   Please be sure medication list is accurate. If a new problem present, please set up appointment sooner than planned today.

## 2017-01-16 ENCOUNTER — Encounter: Payer: Self-pay | Admitting: Family Medicine

## 2017-01-16 LAB — BASIC METABOLIC PANEL
BUN: 17 mg/dL (ref 7–25)
CALCIUM: 9.3 mg/dL (ref 8.6–10.3)
CO2: 27 mmol/L (ref 20–32)
Chloride: 104 mmol/L (ref 98–110)
Creat: 0.81 mg/dL (ref 0.70–1.11)
GLUCOSE: 130 mg/dL — AB (ref 65–99)
POTASSIUM: 3.8 mmol/L (ref 3.5–5.3)
Sodium: 144 mmol/L (ref 135–146)

## 2017-01-16 LAB — BRAIN NATRIURETIC PEPTIDE: BRAIN NATRIURETIC PEPTIDE: 124 pg/mL — AB (ref <100)

## 2017-01-26 DIAGNOSIS — L309 Dermatitis, unspecified: Secondary | ICD-10-CM | POA: Diagnosis not present

## 2017-01-26 DIAGNOSIS — L853 Xerosis cutis: Secondary | ICD-10-CM | POA: Diagnosis not present

## 2017-02-04 ENCOUNTER — Encounter: Payer: Self-pay | Admitting: Family Medicine

## 2017-02-04 DIAGNOSIS — K529 Noninfective gastroenteritis and colitis, unspecified: Secondary | ICD-10-CM

## 2017-02-10 MED ORDER — DIPHENOXYLATE-ATROPINE 2.5-0.025 MG PO TABS: 1.0000 | ORAL_TABLET | Freq: Three times a day (TID) | ORAL | 2 refills | Status: DC | PRN

## 2017-02-13 ENCOUNTER — Encounter: Payer: Self-pay | Admitting: Family Medicine

## 2017-02-13 DIAGNOSIS — F331 Major depressive disorder, recurrent, moderate: Secondary | ICD-10-CM

## 2017-02-13 DIAGNOSIS — F419 Anxiety disorder, unspecified: Secondary | ICD-10-CM

## 2017-02-15 MED ORDER — SERTRALINE HCL 50 MG PO TABS: 50.0000 mg | ORAL_TABLET | Freq: Every day | ORAL | 1 refills | Status: DC

## 2017-02-17 ENCOUNTER — Ambulatory Visit: Payer: PPO | Admitting: Family Medicine

## 2017-02-17 ENCOUNTER — Encounter: Payer: Self-pay | Admitting: Neurology

## 2017-02-17 ENCOUNTER — Encounter: Payer: Self-pay | Admitting: Family Medicine

## 2017-02-17 VITALS — BP 126/72 | HR 62 | Temp 97.9°F | Resp 12 | Ht 65.0 in | Wt 181.0 lb

## 2017-02-17 DIAGNOSIS — K529 Noninfective gastroenteritis and colitis, unspecified: Secondary | ICD-10-CM

## 2017-02-17 DIAGNOSIS — R351 Nocturia: Secondary | ICD-10-CM

## 2017-02-17 DIAGNOSIS — J441 Chronic obstructive pulmonary disease with (acute) exacerbation: Secondary | ICD-10-CM

## 2017-02-17 DIAGNOSIS — R251 Tremor, unspecified: Secondary | ICD-10-CM

## 2017-02-17 DIAGNOSIS — F1090 Alcohol use, unspecified, uncomplicated: Secondary | ICD-10-CM

## 2017-02-17 DIAGNOSIS — N401 Enlarged prostate with lower urinary tract symptoms: Secondary | ICD-10-CM

## 2017-02-17 DIAGNOSIS — Z7289 Other problems related to lifestyle: Secondary | ICD-10-CM

## 2017-02-17 DIAGNOSIS — F331 Major depressive disorder, recurrent, moderate: Secondary | ICD-10-CM

## 2017-02-17 NOTE — Progress Notes (Signed)
HPI:   [name] is a 83 y.o. male, who is here today with his wife to follow on recent OV.    He was seen on 01/15/17,when he was c/o dyspnea and wheezing. COPD, I recommend using Albuterol inh as needed. He did not use inhaler. He also has Combivent Respimat, which was prescribed a few months when he had  COPD exacerbation, he did not use this inhaler either. He is reporting that dyspnea has improved, he does not feel short of breath. He thinks it may be related to his age.  Also Hx of BPH with nocturia and urinary urgency. Last OV Doxazosin was increased from 2 mg to 4 mg (2 mg bid). He does not have complaints in this regard. It seems to help and denies side effects.  Chronic diarrhea. He is on Lomotil 1-tab tid as needed. He states that he usually takes it bid, an extra one as needed. His wife tells me that he is also taking OTC Imodium and taking several tabs per day. Sometimes he has urgency. Having 2-3 loose stools per day. Problem did not improve with discontinuing Metformin a few months ago. He has mild cramp before defecation and alleviated after bowel movement. He denies blood in stool. Colonoscopy in 01/2010.  He has refused GI evaluation, does not want another colonoscopy.  Anxiety:  He thinks Sertraline is helping. Depression Dx in 2009. He is currently on Zoloft, increased from 25 mg to 50 mg. His wife thinks his mood has improved. He is less irritable. He denies side effects or suicidal thoughts.  His wife mentions that he is drinking "too much." He is drinking wine, about 24 Oz every night before he goes to bed. + Insomnia. He is also taking Xanax 0.25 mg at night as needed.  His wife is concerned about right > left hand tremor. They have a friend that was Dx with parkinson's and afraid that this could be causing some of his symptoms.  He is not sure about exacerbating or alleviating factors. It was noted a few months ago, states that has  forgotten mention it during prior visits but it seems to be getting worse. He is left handed.  He has Hx of unstable gait,his wife mentions that frequently he has to place feet further appt to help with his balance. Tremor has been noted also at rest. It is interfering with witting and visible when he drinks ,soemtimes spelling his coffee. He has a cousin with possible Parkinson's disease.   Review of Systems  Constitutional: Positive for fatigue (No more than usual). Negative for activity change, appetite change and fever.  HENT: Positive for hearing loss (Hearing aids). Negative for nosebleeds, sore throat and trouble swallowing.   Eyes: Negative for redness and visual disturbance.  Respiratory: Negative for cough, shortness of breath and wheezing.   Cardiovascular: Positive for leg swelling. Negative for chest pain and palpitations.  Gastrointestinal: Positive for diarrhea. Negative for abdominal pain, blood in stool, nausea and vomiting.       No changes in bowel habits.   Endocrine: Negative for cold intolerance, heat intolerance, polydipsia, polyphagia and polyuria.  Genitourinary: Positive for frequency. Negative for decreased urine volume, dysuria and hematuria.  Musculoskeletal: Positive for arthralgias and gait problem.  Skin: Negative for pallor and rash.  Allergic/Immunologic: Positive for environmental allergies.  Neurological: Positive for tremors. Negative for dizziness, syncope, facial asymmetry, speech difficulty, weakness and headaches.  Psychiatric/Behavioral: Positive for sleep disturbance. Negative for  confusion. The patient is nervous/anxious.       Current Outpatient Medications on File Prior to Visit  Medication Sig Dispense Refill  . ALPRAZolam (XANAX) 0.25 MG tablet TAKE 1/2 - 1 TABLET BY MOUTH AT BEDTIME AS NEEDED 30 tablet 1  . aspirin 81 MG EC tablet Take 81 mg by mouth daily.     . diclofenac sodium (VOLTAREN) 1 % GEL Apply 4 g topically 4 (four) times  daily. 400 g 2  . diphenoxylate-atropine (LOMOTIL) 2.5-0.025 MG tablet Take 1 tablet by mouth 3 (three) times daily as needed for diarrhea or loose stools. 90 tablet 2  . doxazosin (CARDURA) 2 MG tablet Take 1 tablet (2 mg total) by mouth 2 (two) times daily. 90 tablet 2  . finasteride (PROSCAR) 5 MG tablet Take 1 tablet (5 mg total) by mouth daily. 90 tablet 2  . furosemide (LASIX) 40 MG tablet Take 1 tab (40 mg total) by mouth daily. You may take 1 extra lasix in the evening if you have SOB, LEE, and weight gain > 3lbs in 24 hours 180 tablet 1  . gabapentin (NEURONTIN) 300 MG capsule Take 1 capsule (300 mg total) by mouth 3 (three) times daily. 270 capsule 1  . glipiZIDE (GLUCOTROL) 5 MG tablet Take 1 tablet (5 mg total) by mouth 2 (two) times daily before a meal. 60 tablet 3  . Glucose Blood (FREESTYLE LITE TEST VI)     . ketoconazole (NIZORAL) 2 % cream Apply 1 application topically daily. 15 g 0  . losartan (COZAAR) 25 MG tablet Take 1 tablet (25 mg total) by mouth daily. 30 tablet 2  . metoprolol succinate (TOPROL-XL) 25 MG 24 hr tablet Take 1 tablet (25 mg total) by mouth daily. 90 tablet 1  . ONE TOUCH ULTRA TEST test strip USE TO CHECK BLOOD SUGAR DAILY AND PRN 100 each 4  . ONETOUCH DELICA LANCETS 24O MISC USE TO CHECK BLOOD SUGAR DAILY AND PRN 100 each 4  . sertraline (ZOLOFT) 50 MG tablet Take 1 tablet (50 mg total) daily by mouth. 90 tablet 1  . tamsulosin (FLOMAX) 0.4 MG CAPS capsule Take 1 capsule (0.4 mg total) by mouth daily after supper. 90 capsule 1  . timolol (TIMOPTIC-XR) 0.5 % ophthalmic gel-forming      No current facility-administered medications on file prior to visit.      Past Medical History:  Diagnosis Date  . BACK PAIN, CHRONIC 04/18/2009  . BENIGN PROSTATIC HYPERTROPHY, HX OF 01/19/2007  . Carpal tunnel syndrome 03/24/2007  . CORONARY ARTERY DISEASE 01/19/2007  . DEPRESSION, CHRONIC 06/24/2007  . DIABETES MELLITUS 01/19/2007  . DIABETES MELLITUS, TYPE II, WITH  NEUROLOGICAL COMPLICATIONS 12/19/3530  . Diarrhea 02/23/2007  . HEART MURMUR, SYSTOLIC 9/92/4268  . HERPES SIMPLEX INFECTION 01/19/2007  . HYPERLIPIDEMIA 01/19/2007  . HYPERTENSION 01/19/2007  . Intermediate coronary syndrome ([place]) 08/29/2008  . LEG CRAMPS, NOCTURNAL 10/28/2007  . Leg cramps, sleep related 08/05/2010  . OBSTRUCTIVE SLEEP APNEA 01/19/2007   does not use CPAP  . OSTEOARTHRITIS 06/24/2007  . Other postprocedural status(V45.89) 01/19/2007  . PERCUTANEOUS TRANSLUMINAL CORONARY ANGIOPLASTY, HX OF 01/19/2007  . PLANTAR FASCIITIS, RIGHT 01/19/2007  . SHOULDER PAIN, LEFT 03/24/2007  . SPINAL STENOSIS, LUMBAR 06/03/2009  . TINEA PEDIS 11/15/2009  . URI 01/11/2009   No Known Allergies  Social History   Socioeconomic History  . Marital status: Married    Spouse name: None  . Number of children: None  . Years of education: None  .  Highest education level: None  Social Needs  . Financial resource strain: None  . Food insecurity - worry: None  . Food insecurity - inability: None  . Transportation needs - medical: None  . Transportation needs - non-medical: None  Occupational History  . 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 Smoking status: Former Smoker    Last attempt to quit: 04/14/1983    Years since quitting: 33.8  . Smokeless tobacco: Never Used  . Tobacco comment: stopped 1985  Substance and Sexual Activity  . Alcohol use: Yes    Comment: 2-3 times a week - wine, scotch  . Drug use: No  . Sexual activity: None  Other Topics Concern  . None  Social History Narrative   Married in 1959 - 75yrs, divorced; married 1974 - 1.5 years, divorced; married 1985 1 daughter 27; 46 step daughters; 2 grandchildren.    Vitals:   02/17/17 1035  BP: 126/72  Pulse: 62  Resp: 12  Temp: 97.9 F (36.6 C)  SpO2: 95%   Body mass index is 30.12 kg/m.   Physical Exam  Nursing note and vitals reviewed. Constitutional: He is oriented to person,  place, and time. He appears well-developed. No distress.  HENT:  Head: Normocephalic and atraumatic.  Mouth/Throat: Oropharynx is clear and moist and mucous membranes are normal.  Eyes: Conjunctivae are normal. Pupils are equal, round, and reactive to light.  Neck: No JVD present. No tracheal deviation present. No thyroid mass and no thyromegaly present.  Cardiovascular: Normal rate and regular rhythm.  Murmur (SEM I-II RUSB and LUSB) heard. Pulses:      Dorsalis pedis pulses are 2+ on the right side, and 2+ on the left side.  Respiratory: Effort normal and breath sounds normal. No respiratory distress.  GI: Soft. He exhibits no mass. There is no hepatomegaly. There is no tenderness.  Musculoskeletal: He exhibits edema (1+ pitting LE edema,bilateral.). He exhibits no tenderness.  Lymphadenopathy:    He has no cervical adenopathy.  Neurological: He is alert and oriented to person, place, and time. He has normal strength. He displays tremor.  Mild hand tremor not present at rest.  ? Cogwheel rigidity. Stable gait assisted with a cane. Some difficulty with nose-finger test.  Skin: Skin is warm. No erythema.  Psychiatric: His mood appears anxious.  Well groomed, good eye contact.    ASSESSMENT AND PLAN:   [name] was seen today for f/u cough and wheezing.  Diagnoses and all orders for this visit:  COPD exacerbation ([place])  Exacerbation resolved. He has Combivent Respimat at home, recommend a puff bid. Some side effects discussed.  Tremor, unspecified  Mild hand tremor noted on exam, not present at rest. We discussed possible etiologies: Anxiety,medications,alcohol,and essential among some. Referral to neuro placed.  -     Ambulatory referral to Neurology  Chronic diarrhea  IBS-D. Side effects of Lomotil/Imodium discussed. Refused GI evaluation. Low residual diet recommended as well as Metamucil. Adequate hydration.  BPH associated with nocturia  No changes in  current management. F/U in 3 months.  Alcohol intake above recommended sensible limits  We discussed adverse effects of alcohol abuse and recommended amount for males. This could aggrvate some of his chronic problems. Encouraged to decreased alcohol intake,max 6-8 Oz per day.  Depression, major, recurrent, moderate (HCC)  Depression and anxiety seem to have improved with Sertraline. No changes in current management. Instructed about warning signs. F/U in 3 months.    Inez Catalina  G. [name], MD  [facility]. [place] office.

## 2017-02-17 NOTE — Patient Instructions (Addendum)
A few things to remember from today's visit:   COPD exacerbation ([place])  Tremor, unspecified - Plan: Ambulatory referral to Neurology  Chronic diarrhea  Depression, major, recurrent, moderate (HCC)  Metamucil daily. Low residual diet.  Adequate hydration. Decrease alcohol intake, max 8 oz daily.    Please be sure medication list is accurate. If a new problem present, please set up appointment sooner than planned today.

## 2017-02-25 ENCOUNTER — Encounter: Payer: Self-pay | Admitting: Family Medicine

## 2017-03-01 ENCOUNTER — Encounter: Payer: Self-pay | Admitting: Family Medicine

## 2017-03-11 ENCOUNTER — Encounter: Payer: Self-pay | Admitting: Family Medicine

## 2017-03-11 ENCOUNTER — Other Ambulatory Visit: Payer: Self-pay | Admitting: Family Medicine

## 2017-03-11 DIAGNOSIS — E1140 Type 2 diabetes mellitus with diabetic neuropathy, unspecified: Secondary | ICD-10-CM

## 2017-03-16 ENCOUNTER — Other Ambulatory Visit: Payer: Self-pay | Admitting: Family Medicine

## 2017-03-16 DIAGNOSIS — R351 Nocturia: Secondary | ICD-10-CM

## 2017-03-16 DIAGNOSIS — I10 Essential (primary) hypertension: Secondary | ICD-10-CM

## 2017-03-16 DIAGNOSIS — E1140 Type 2 diabetes mellitus with diabetic neuropathy, unspecified: Secondary | ICD-10-CM

## 2017-03-16 DIAGNOSIS — N401 Enlarged prostate with lower urinary tract symptoms: Secondary | ICD-10-CM

## 2017-03-16 DIAGNOSIS — N400 Enlarged prostate without lower urinary tract symptoms: Secondary | ICD-10-CM

## 2017-03-16 MED ORDER — FINASTERIDE 5 MG PO TABS: 5.0000 mg | ORAL_TABLET | Freq: Every day | ORAL | 2 refills | Status: DC

## 2017-03-16 MED ORDER — METOPROLOL SUCCINATE ER 25 MG PO TB24: 25.0000 mg | ORAL_TABLET | Freq: Every day | ORAL | 1 refills | Status: DC

## 2017-03-16 MED ORDER — FUROSEMIDE 40 MG PO TABS: 40.0000 mg | ORAL_TABLET | Freq: Every day | ORAL | 2 refills | Status: DC

## 2017-03-16 MED ORDER — DOXAZOSIN MESYLATE 2 MG PO TABS: 2.0000 mg | ORAL_TABLET | Freq: Two times a day (BID) | ORAL | 1 refills | Status: DC

## 2017-03-16 MED ORDER — GLIPIZIDE 5 MG PO TABS: ORAL_TABLET | ORAL | 1 refills | Status: DC

## 2017-03-18 ENCOUNTER — Encounter: Payer: Self-pay | Admitting: Family Medicine

## 2017-03-30 NOTE — Progress Notes (Signed)
Subjective:   [name] was seen in consultation in the movement disorder clinic at the request of [name], Malka So, MD.  This patient is accompanied in the office by his spouse who supplements the history.  The evaluation is for tremor.  Tremor started approximately 6 months ago and involves the bilateral UE.  Tremor is most noticeable when he goes to do something with fine motor coordination.   There is no family hx of tremor.  Patient worried about the development of Parkinson's disease, as he had a friend who had Parkinson's disease.  Affected by caffeine:  No. (2 cups per day) Affected by alcohol:  Doesn't know (drinks 24 oz of wine per night - "helps me get to sleep") Affected by stress:  More anxious than in the past but doesn't affect tremor Affected by fatigue:  No. Spills soup if on spoon:  Yes.  , it can Spills glass of liquid if full:  Yes.  , sometimes Affects ADL's (tying shoes, brushing teeth, etc):  No.  Current/Previously tried tremor medications: xanax (takes 1 time per month for sleep); gabapentin: 900 mg tid for muscle cramping at night  Current medications that may exacerbate tremor:  Albuterol/ combivent (now off of these meds)  Outside reports reviewed: historical medical records, lab reports, office notes and referral letter/letters.  No Known Allergies  Outpatient Encounter Medications as of 04/01/2017  Medication Sig  . ALPRAZolam (XANAX) 0.25 MG tablet TAKE 1/2 - 1 TABLET BY MOUTH AT BEDTIME AS NEEDED  . diphenoxylate-atropine (LOMOTIL) 2.5-0.025 MG tablet Take 1 tablet by mouth 3 (three) times daily as needed for diarrhea or loose stools.  . doxazosin (CARDURA) 2 MG tablet Take 1 tablet (2 mg total) by mouth 2 (two) times daily.  . finasteride (PROSCAR) 5 MG tablet Take 1 tablet (5 mg total) by mouth daily.  . furosemide (LASIX) 40 MG tablet Take 1 tablet (40 mg total) by mouth daily. You may take 1 extra lasix in the evening if you have SOB, LEE, and  weight gain > 3lbs in 24 hours  . gabapentin (NEURONTIN) 300 MG capsule Take 1 capsule (300 mg total) by mouth 3 (three) times daily. (Patient taking differently: Take 900 mg by mouth 3 (three) times daily. )  . glipiZIDE (GLUCOTROL) 5 MG tablet TAKE 1 TABLET BY MOUTH TWICE A DAY BEFORE A MEAL  . ketoconazole (NIZORAL) 2 % cream Apply 1 application topically daily.  Marland Kitchen losartan (COZAAR) 25 MG tablet Take 1 tablet (25 mg total) by mouth daily.  . metoprolol succinate (TOPROL-XL) 25 MG 24 hr tablet Take 1 tablet (25 mg total) by mouth daily.  . sertraline (ZOLOFT) 50 MG tablet Take 1 tablet (50 mg total) daily by mouth.  . tamsulosin (FLOMAX) 0.4 MG CAPS capsule Take 1 capsule (0.4 mg total) by mouth daily after supper.  . tizanidine (ZANAFLEX) 2 MG capsule Take 2 mg by mouth as needed for muscle spasms.  . Glucose Blood (FREESTYLE LITE TEST VI)   . ONE TOUCH ULTRA TEST test strip USE TO CHECK BLOOD SUGAR DAILY AND PRN (Patient not taking: Reported on 04/01/2017)  . ONETOUCH DELICA LANCETS 79G MISC USE TO CHECK BLOOD SUGAR DAILY AND PRN (Patient not taking: Reported on 04/01/2017)  . [DISCONTINUED] aspirin 81 MG EC tablet Take 81 mg by mouth daily.   . [DISCONTINUED] diclofenac sodium (VOLTAREN) 1 % GEL Apply 4 g topically 4 (four) times daily.  . [DISCONTINUED] timolol (TIMOPTIC-XR) 0.5 % ophthalmic gel-forming  No facility-administered encounter medications on file as of 04/01/2017.     Past Medical History:  Diagnosis Date  . BACK PAIN, CHRONIC 04/18/2009  . BENIGN PROSTATIC HYPERTROPHY, HX OF 01/19/2007  . Carpal tunnel syndrome 03/24/2007  . CORONARY ARTERY DISEASE 01/19/2007  . DEPRESSION, CHRONIC 06/24/2007  . DIABETES MELLITUS 01/19/2007  . DIABETES MELLITUS, TYPE II, WITH NEUROLOGICAL COMPLICATIONS 05/23/5619  . Diarrhea 02/23/2007  . HEART MURMUR, SYSTOLIC 06/18/6576  . HERPES SIMPLEX INFECTION 01/19/2007  . HYPERLIPIDEMIA 01/19/2007  . HYPERTENSION 01/19/2007  . Intermediate coronary  syndrome ([place]) 08/29/2008  . LEG CRAMPS, NOCTURNAL 10/28/2007  . Leg cramps, sleep related 08/05/2010  . OBSTRUCTIVE SLEEP APNEA 01/19/2007   does not use CPAP  . OSTEOARTHRITIS 06/24/2007  . Other postprocedural status(V45.89) 01/19/2007  . PERCUTANEOUS TRANSLUMINAL CORONARY ANGIOPLASTY, HX OF 01/19/2007  . PLANTAR FASCIITIS, RIGHT 01/19/2007  . SHOULDER PAIN, LEFT 03/24/2007  . SPINAL STENOSIS, LUMBAR 06/03/2009  . TINEA PEDIS 11/15/2009  . URI 01/11/2009    Past Surgical History:  Procedure Laterality Date  . arthroscopy, knee of hx    . CARDIAC CATHETERIZATION    . COLONOSCOPY    . inguinal herniorrhapy, hx    . NASAL SINUS SURGERY    . percutaneous transluminal coronary angioplasty, hx of  2004   Dr. Lyndel Safe  . plastic joint in thumb    . PTCA/stent  2004  . TONSILLECTOMY    . VASECTOMY      Social History   Socioeconomic History  . Marital status: Married    Spouse name: Not on file  . Number of children: Not on file  . Years of education: Not on file  . Highest education level: Not on file  Social Needs  . Financial resource strain: Not on file  . Food insecurity - worry: Not on file  . Food insecurity - inability: Not on file  . Transportation needs - medical: Not on file  . Transportation needs - non-medical: Not on file  Occupational History  . 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 Smoking status: Former Smoker    Last attempt to quit: 04/14/1983    Years since quitting: 33.9  . Smokeless tobacco: Never Used  . Tobacco comment: stopped 1985  Substance and Sexual Activity  . Alcohol use: Yes    Comment: 2-3 times a week - wine, scotch  . Drug use: No  . Sexual activity: Not on file  Other Topics Concern  . Not on file  Social History Narrative   Married in 1959 - 48yrs, divorced; married 1974 - 1.5 years, divorced; married 1985 1 daughter 56; 39 step daughters; 2 grandchildren.    Family Status  Relation Name Status  .  Mother  Deceased at age 52       complications of rheum disease  . Father  Deceased at age 18  . Sister  (Not Specified)       rheumatism  . Daughter  (Not Specified)       '72  . Other family (Not Specified)    Review of Systems Occasional DOE.  A complete 10 system ROS was obtained and was negative apart from what is mentioned.   Objective:   VITALS:   Vitals:   04/01/17 1335  BP: (!) 148/96  Pulse: 74  SpO2: 92%  Weight: 181 lb (82.1 kg)  Height: 5\' 5"  (1.651 m)   Gen:  Appears stated age and in NAD. HEENT:  Normocephalic,  atraumatic. The mucous membranes are moist. The superficial temporal arteries are without ropiness or tenderness. Cardiovascular: Regular rate and rhythm. Lungs: Clear to auscultation bilaterally. Neck: There are no carotid bruits noted bilaterally.  NEUROLOGICAL:  Orientation:  The patient is alert and oriented x 3.  Recent and remote memory are intact.  Attention span and concentration are normal.  Able to name objects and repeat without trouble.  Fund of knowledge is appropriate Cranial nerves: There is good facial symmetry. The pupils are equal round and reactive to light bilaterally. Fundoscopic exam reveals clear disc margins bilaterally. Extraocular muscles are intact and visual fields are full to confrontational testing. Speech is fluent and clear. Soft palate rises symmetrically and there is no tongue deviation. Hearing is intact to conversational tone. Tone: Tone is good throughout. Sensation: Sensation is intact to light touch and pinprick throughout (facial, trunk, extremities). Vibration is absent at the bilateral big toe, ankle, knees. There is no extinction with double simultaneous stimulation. There is no sensory dermatomal level identified. Coordination:  The patient has no dysdiadichokinesia or dysmetria. Motor: Strength is 5/5 in the bilateral upper and lower extremities.  Shoulder shrug is equal bilaterally.  There is no pronator drift.   There are no fasciculations noted. DTR's: Deep tendon reflexes are 2/4 at the bilateral biceps, triceps, brachioradialis, absent at the bilateral patella and achilles.  Plantar responses are downgoing bilaterally. Gait and Station: The patient arises out of the chair with pushing off of it.  He ambulates with his cane.  He has a very wide-based and unsteady gait, but does okay with a cane.  MOVEMENT EXAM: Tremor:  There is minimal tremor in the UE, noted most significantly with action.  The patient is able to draw Archimedes spirals without significant difficulty.  There is no tremor at rest.  The patient is able to pour water from one glass to another without spilling it.  Labs:  Lab Results  Component Value Date   TSH 1.60 10/16/2015      Chemistry      Component Value Date/Time   NA 144 01/15/2017 1555   K 3.8 01/15/2017 1555   CL 104 01/15/2017 1555   CO2 27 01/15/2017 1555   BUN 17 01/15/2017 1555   CREATININE 0.81 01/15/2017 1555      Component Value Date/Time   CALCIUM 9.3 01/15/2017 1555   ALKPHOS 68 07/03/2016 1130   AST 25 07/03/2016 1130   ALT 26 07/03/2016 1130   BILITOT 0.7 07/03/2016 1130     Lab Results  Component Value Date   VITAMINB12 276 09/28/2012   Lab Results  Component Value Date   HGBA1C 7.6 (H) 10/30/2016     Assessment/Plan:   1.  Tremor  -explained that he does not have Parkinson's disease.  Reassurance was provided.  He may have a very mild case of essential tremor.  I do not recommend any medication for this.  He agreed.  -Discussed complex relationship of tremor to alcohol.  Discussed the importance of slowly tapering and ultimately discontinuing alcohol.  2.  Alcohol overuse and marijuana use  -He really is using alcohol to help him sleep.  Discussed that alcohol actually causes disruption of REM sleep.  It helps him get to sleep, but he admits that he does not stay asleep.  In addition, he has sleep apnea which is untreated.  Discussed  morbidity and mortality associated with untreated sleep apnea.  I would recommend not using alcohol for sleep and would recommend  discontinuing marijuana.  3.  Peripheral neuropathy, likely due to diabetes  -The patient has clinical examination evidence of a very significant diffuse peripheral neuropathy, which certainly can affect gait and balance.  We discussed safety associated with peripheral neuropathy.  We discussed balance therapy and the importance of ambulatory assistive device for balance assistance.  He is using his cane.  4.  LE cramping  -wonder if related to alcohol/dehydration at night.  He does not think so.  He has had lower extremity cramping for a long time.  It was better when he was using opioids, and came back when his opioids were discontinued because of concern about long-term opioid use.  -he tried zanaflex without relief.  D/c.    -try tonic water.  Not sure if it will help as tried quinine in the past without relief  -start baclofen, 10 mg at night.  Can increase if this is well-tolerated but ineffective.  Risks, benefits, side effects and alternative therapies were discussed.  The opportunity to ask questions was given and they were answered to the best of my ability.  The patient expressed understanding and willingness to follow the outlined treatment protocols.  5.  b12 deficiency  -not been checked in 4.5 years.  Will recheck.    6.  Follow up is anticipated in the next few months, sooner should new neurologic issues arise.    CC:  [name], [name], MD

## 2017-04-01 ENCOUNTER — Encounter: Payer: Self-pay | Admitting: Family Medicine

## 2017-04-01 ENCOUNTER — Ambulatory Visit: Payer: PPO | Admitting: Neurology

## 2017-04-01 ENCOUNTER — Other Ambulatory Visit: Payer: PPO

## 2017-04-01 ENCOUNTER — Encounter: Payer: Self-pay | Admitting: Neurology

## 2017-04-01 VITALS — BP 148/96 | HR 74 | Ht 65.0 in | Wt 181.0 lb

## 2017-04-01 DIAGNOSIS — G4762 Sleep related leg cramps: Secondary | ICD-10-CM

## 2017-04-01 DIAGNOSIS — E1142 Type 2 diabetes mellitus with diabetic polyneuropathy: Secondary | ICD-10-CM

## 2017-04-01 DIAGNOSIS — R251 Tremor, unspecified: Secondary | ICD-10-CM

## 2017-04-01 DIAGNOSIS — E538 Deficiency of other specified B group vitamins: Secondary | ICD-10-CM | POA: Diagnosis not present

## 2017-04-01 MED ORDER — BACLOFEN 10 MG PO TABS: 10.0000 mg | ORAL_TABLET | Freq: Every day | ORAL | 1 refills | Status: DC

## 2017-04-01 NOTE — Patient Instructions (Signed)
1. Your provider has requested that you have labwork completed today. Please go to [facility] ([id number]) on the second floor of this building before leaving the office today. You do not need to check in. If you are not called within 15 minutes please check with the front desk.   2. Stop Zanaflex. Start Baclofen 10 mg - one tablet at night. Prescription sent to your pharmacy.   3. Use Tonic Water - 1 oz 30 minutes prior to bedtime to help with leg cramping.

## 2017-04-02 ENCOUNTER — Telehealth: Payer: Self-pay | Admitting: Neurology

## 2017-04-02 LAB — VITAMIN B12: VITAMIN B 12: 370 pg/mL (ref 200–1100)

## 2017-04-02 LAB — TSH: TSH: 1.68 m[IU]/L (ref 0.40–4.50)

## 2017-04-02 NOTE — Telephone Encounter (Signed)
-----   Message from [place], DO sent at 04/02/2017  7:57 AM EST ----- Have him take oral B12 supplement

## 2017-04-02 NOTE — Telephone Encounter (Signed)
Mychart message sent to patient.

## 2017-04-05 ENCOUNTER — Ambulatory Visit: Payer: PPO | Admitting: Family Medicine

## 2017-04-05 ENCOUNTER — Encounter: Payer: Self-pay | Admitting: Family Medicine

## 2017-04-05 VITALS — BP 160/80 | HR 63 | Temp 97.6°F | Resp 16 | Ht 65.0 in | Wt 178.4 lb

## 2017-04-05 DIAGNOSIS — M1711 Unilateral primary osteoarthritis, right knee: Secondary | ICD-10-CM | POA: Diagnosis not present

## 2017-04-05 DIAGNOSIS — I119 Hypertensive heart disease without heart failure: Secondary | ICD-10-CM

## 2017-04-05 DIAGNOSIS — E1140 Type 2 diabetes mellitus with diabetic neuropathy, unspecified: Secondary | ICD-10-CM | POA: Diagnosis not present

## 2017-04-05 LAB — POCT GLYCOSYLATED HEMOGLOBIN (HGB A1C): Hemoglobin A1C: 7

## 2017-04-05 MED ORDER — VALSARTAN 80 MG PO TABS: 80.0000 mg | ORAL_TABLET | Freq: Every day | ORAL | 2 refills | Status: DC

## 2017-04-05 MED ORDER — TRIAMCINOLONE ACETONIDE 40 MG/ML IJ SUSP: 40.0000 mg | Freq: Once | INTRAMUSCULAR | Status: DC

## 2017-04-05 NOTE — Progress Notes (Signed)
ACUTE VISIT   HPI:  Chief Complaint  Patient presents with  . Shots in knees    knee pain    [name] is a 83 y.o. male, who is here today complaining of knee pain, requesting intera articular injection.  History of bilateral knee osteoarthritis, he has follow with orthopedist as well as pain management.  He has refused surgical treatment. He could not afford follow ups with Dr Posey Pronto, chronic pain.  Steroid intra-articular injection has helped in the past.  He denies recent trauma, edema,or erythema. Pain is constant, R>L, constant, exacerbated by standing up after prolonged sitting, walking, and prolonged standing. Pain is alleviated by rest. 8/10. He has not taking OTC medication for pain.   DM 2: Chronic. Concerned about elevated BS's. BS's 150-190 (FG). He has not had hypoglycemia. He is currently on Glipizide 5 mg twice daily. + Peripheral neuropathy,stable numbness sensation LE's.  Denies abdominal pain, nausea,vomiting, polydipsia,or polyphagia.   HTN:  His BP has been elevated at home since he changed from Valsartan  to Losartan.  Due to medication recall, he discontinued Valsartan a few months ago. 170's/90's.  He is also on Metoprolol Succinate 25 mg daily.  Denies headache, visual changes, chest pain, dyspnea, palpitation, focal weakness, or worsening edema.   Review of Systems  Constitutional: Positive for fatigue. Negative for activity change, appetite change and fever.  Eyes: Negative for redness and visual disturbance.  Respiratory: Negative for cough, shortness of breath and wheezing.   Cardiovascular: Negative for chest pain, palpitations and leg swelling.  Gastrointestinal: Negative for abdominal pain, nausea and vomiting.  Endocrine: Negative for polydipsia, polyphagia and polyuria.  Genitourinary: Negative for decreased urine volume, dysuria and hematuria.  Musculoskeletal: Positive for arthralgias, back pain and gait  problem. Negative for joint swelling.  Skin: Negative for rash and wound.  Neurological: Negative for syncope, weakness and headaches.  Psychiatric/Behavioral: Positive for sleep disturbance. Negative for confusion. The patient is nervous/anxious.       Current Outpatient Medications on File Prior to Visit  Medication Sig Dispense Refill  . ALPRAZolam (XANAX) 0.25 MG tablet TAKE 1/2 - 1 TABLET BY MOUTH AT BEDTIME AS NEEDED 30 tablet 1  . baclofen (LIORESAL) 10 MG tablet Take 1 tablet (10 mg total) by mouth at bedtime. 90 tablet 1  . diphenoxylate-atropine (LOMOTIL) 2.5-0.025 MG tablet Take 1 tablet by mouth 3 (three) times daily as needed for diarrhea or loose stools. 90 tablet 2  . doxazosin (CARDURA) 2 MG tablet Take 1 tablet (2 mg total) by mouth 2 (two) times daily. 180 tablet 1  . finasteride (PROSCAR) 5 MG tablet Take 1 tablet (5 mg total) by mouth daily. 90 tablet 2  . furosemide (LASIX) 40 MG tablet Take 1 tablet (40 mg total) by mouth daily. You may take 1 extra lasix in the evening if you have SOB, LEE, and weight gain > 3lbs in 24 hours 100 tablet 2  . gabapentin (NEURONTIN) 300 MG capsule Take 1 capsule (300 mg total) by mouth 3 (three) times daily. (Patient taking differently: Take 900 mg by mouth 3 (three) times daily. ) 270 capsule 1  . glipiZIDE (GLUCOTROL) 5 MG tablet TAKE 1 TABLET BY MOUTH TWICE A DAY BEFORE A MEAL 160 tablet 1  . Glucose Blood (FREESTYLE LITE TEST VI)     . ketoconazole (NIZORAL) 2 % cream Apply 1 application topically daily. 15 g 0  . metoprolol succinate (TOPROL-XL) 25 MG 24 hr tablet  Take 1 tablet (25 mg total) by mouth daily. 90 tablet 1  . ONE TOUCH ULTRA TEST test strip USE TO CHECK BLOOD SUGAR DAILY AND PRN 100 each 4  . ONETOUCH DELICA LANCETS 89F MISC USE TO CHECK BLOOD SUGAR DAILY AND PRN 100 each 4  . sertraline (ZOLOFT) 50 MG tablet Take 1 tablet (50 mg total) daily by mouth. 90 tablet 1  . tamsulosin (FLOMAX) 0.4 MG CAPS capsule Take 1 capsule  (0.4 mg total) by mouth daily after supper. 90 capsule 1   No current facility-administered medications on file prior to visit.      Past Medical History:  Diagnosis Date  . BACK PAIN, CHRONIC 04/18/2009  . BENIGN PROSTATIC HYPERTROPHY, HX OF 01/19/2007  . Carpal tunnel syndrome 03/24/2007  . CORONARY ARTERY DISEASE 01/19/2007  . DEPRESSION, CHRONIC 06/24/2007  . DIABETES MELLITUS 01/19/2007  . DIABETES MELLITUS, TYPE II, WITH NEUROLOGICAL COMPLICATIONS 11/11/173  . Diarrhea 02/23/2007  . HEART MURMUR, SYSTOLIC 04/14/5850  . HERPES SIMPLEX INFECTION 01/19/2007  . HYPERLIPIDEMIA 01/19/2007  . HYPERTENSION 01/19/2007  . Intermediate coronary syndrome ([place]) 08/29/2008  . LEG CRAMPS, NOCTURNAL 10/28/2007  . Leg cramps, sleep related 08/05/2010  . OBSTRUCTIVE SLEEP APNEA 01/19/2007   does not use CPAP  . OSTEOARTHRITIS 06/24/2007  . Other postprocedural status(V45.89) 01/19/2007  . PERCUTANEOUS TRANSLUMINAL CORONARY ANGIOPLASTY, HX OF 01/19/2007  . PLANTAR FASCIITIS, RIGHT 01/19/2007  . SHOULDER PAIN, LEFT 03/24/2007  . SPINAL STENOSIS, LUMBAR 06/03/2009  . TINEA PEDIS 11/15/2009  . URI 01/11/2009   No Known Allergies  Social History   Socioeconomic History  . Marital status: Married    Spouse name: None  . Number of children: None  . Years of education: None  . Highest education level: None  Social Needs  . Financial resource strain: None  . Food insecurity - worry: None  . Food insecurity - inability: None  . Transportation needs - medical: None  . Transportation needs - non-medical: None  Occupational History  . Occupation: retired    Comment: Truck Research officer, political party  Tobacco Use  . Smoking status: Former Smoker    Last attempt to quit: 04/14/1983    Years since quitting: 34.0  . Smokeless tobacco: Never Used  . Tobacco comment: stopped 1985  Substance and Sexual Activity  . Alcohol use: Yes    Comment: 24 oz daily wine  . Drug use: Yes    Types: Marijuana  . Sexual activity: None    Other Topics Concern  . None  Social History Narrative   Married in 1959 - 22yrs, divorced; married 1974 - 1.5 years, divorced; married 1985 1 daughter 2; 49 step daughters; 2 grandchildren.    Vitals:   04/05/17 1125 04/05/17 1259  BP: 140/84 (!) 160/80  Pulse: 63   Resp: 16   Temp: 97.6 F (36.4 C)   SpO2: 95%    Body mass index is 29.68 kg/m.   Physical Exam  Nursing note and vitals reviewed. Constitutional: He is oriented to person, place, and time. He appears well-developed. No distress.  HENT:  Head: Normocephalic and atraumatic.  Mouth/Throat: Oropharynx is clear and moist and mucous membranes are normal.  Eyes: Conjunctivae are normal. Pupils are equal, round, and reactive to light.  Cardiovascular: Normal rate and regular rhythm.  Murmur (SEM I-II/VI, heard on RUSB) heard. Pulses:      Dorsalis pedis pulses are 2+ on the right side, and 2+ on the left side.  Respiratory: Effort normal and breath sounds  normal. No respiratory distress.  Musculoskeletal: He exhibits edema (1+ pitting LE edema,bilateral.).  Right knee with no significant limitation in ROM. Severe crepitus with movement, which elicits pain. Minimal effusion,no erythema. Antalgic gait.  Lymphadenopathy:    He has no cervical adenopathy.  Neurological: He is alert and oriented to person, place, and time. He has normal strength.  Unstable gait assisted with a cane.  Skin: Skin is warm. No erythema.  Psychiatric: He has a normal mood and affect. Cognition and memory are normal.  Well groomed, good eye contact.     ASSESSMENT AND PLAN:   [name] was seen today for shots in knees.  Diagnoses and all orders for this visit:  Osteoarthritis of right knee, unspecified osteoarthritis type  After verbal consent and discussion of benefits vs risk of intra-articular steroid injection, he received Kenalog 40 mg mixed with Plain Lidocaine 1% 1.5 cc.  Lateral approach with patient sitting on  table, he tolerated procedure well, no complications. 10-15 minutes after procedure she noted great deal of improvement of right knee pain upon standing as well as walking. He will follow-up in 4 weeks, if he benefits from procedure, we can inject left knee next visit. Instructed to continue monitoring BS closely, side effects of steroids discussed.  -     triamcinolone acetonide (KENALOG-40) injection 40 mg  Type 2 diabetes mellitus with diabetic neuropathy, without long-term current use of insulin (HCC)  HgA1C at goal, 7.0. No changes in current management. Healthy diet with avoidance of added sugar food intake is an important part of treatment and recommended. Annual eye exam, periodic dental and foot care recommended. F/U in 5-6 months  -     POCT glycosylated hemoglobin (Hb A1C)  Hypertension with heart disease  Not well controlled. Possible complications of elevated BP discussed. Losartan to discontinue. Valsartan 80 mg started today. No changes on Metoprolol Succinate. Continue monitoring BP at home. Instructed about warning signs. Follow-up in 4 weeks.   -     valsartan (DIOVAN) 80 MG tablet; Take 1 tablet (80 mg total) by mouth daily.     Return in about 4 weeks (around 05/03/2017) for HTN,knee pain.     [name]. [name], MD  [facility]. [place] office.

## 2017-04-05 NOTE — Patient Instructions (Addendum)
A few things to remember from today's visit:   Type 2 diabetes mellitus with diabetic neuropathy, without long-term current use of insulin ([place]) - Plan: POCT glycosylated hemoglobin (Hb A1C)  Osteoarthritis of right knee, unspecified osteoarthritis type  Hypertension with heart disease  Stop Losartan. Resume Valsartan.  No changes in diabetes med.   Please be sure medication list is accurate. If a new problem present, please set up appointment sooner than planned today.

## 2017-04-12 MED ORDER — TRIAMCINOLONE ACETONIDE 40 MG/ML IJ SUSP
40.0000 mg | Freq: Once | INTRAMUSCULAR | Status: AC
  Administered 2017-04-05: 40 mg via INTRA_ARTICULAR

## 2017-04-12 NOTE — Addendum Note (Signed)
Addended by: Dorrene German on: 04/12/2017 08:14 AM   Modules accepted: Orders

## 2017-04-13 ENCOUNTER — Encounter: Payer: Self-pay | Admitting: Family Medicine

## 2017-04-14 ENCOUNTER — Other Ambulatory Visit: Payer: Self-pay | Admitting: *Deleted

## 2017-04-18 ENCOUNTER — Other Ambulatory Visit: Payer: Self-pay | Admitting: Family Medicine

## 2017-04-18 MED ORDER — TAMSULOSIN HCL 0.4 MG PO CAPS: 0.4000 mg | ORAL_CAPSULE | Freq: Every day | ORAL | 2 refills | Status: DC

## 2017-04-23 ENCOUNTER — Ambulatory Visit (INDEPENDENT_AMBULATORY_CARE_PROVIDER_SITE_OTHER): Payer: Medicare HMO | Admitting: Family Medicine

## 2017-04-23 ENCOUNTER — Encounter: Payer: Self-pay | Admitting: Family Medicine

## 2017-04-23 VITALS — BP 142/80 | HR 70 | Temp 97.6°F | Resp 16 | Ht 65.0 in | Wt 178.5 lb

## 2017-04-23 DIAGNOSIS — R21 Rash and other nonspecific skin eruption: Secondary | ICD-10-CM

## 2017-04-23 DIAGNOSIS — R209 Unspecified disturbances of skin sensation: Secondary | ICD-10-CM | POA: Diagnosis not present

## 2017-04-23 DIAGNOSIS — M79604 Pain in right leg: Secondary | ICD-10-CM

## 2017-04-23 DIAGNOSIS — R1011 Right upper quadrant pain: Secondary | ICD-10-CM

## 2017-04-23 LAB — CBC WITH DIFFERENTIAL/PLATELET
BASOS PCT: 0.6 % (ref 0.0–3.0)
Basophils Absolute: 0 10*3/uL (ref 0.0–0.1)
EOS PCT: 2.9 % (ref 0.0–5.0)
Eosinophils Absolute: 0.1 10*3/uL (ref 0.0–0.7)
HEMATOCRIT: 44.4 % (ref 39.0–52.0)
Hemoglobin: 15 g/dL (ref 13.0–17.0)
LYMPHS PCT: 21.4 % (ref 12.0–46.0)
Lymphs Abs: 0.9 10*3/uL (ref 0.7–4.0)
MCHC: 33.9 g/dL (ref 30.0–36.0)
MCV: 89.6 fl (ref 78.0–100.0)
Monocytes Absolute: 0.4 10*3/uL (ref 0.1–1.0)
Monocytes Relative: 10.8 % (ref 3.0–12.0)
NEUTROS ABS: 2.7 10*3/uL (ref 1.4–7.7)
Neutrophils Relative %: 64.3 % (ref 43.0–77.0)
PLATELETS: 133 10*3/uL — AB (ref 150.0–400.0)
RBC: 4.95 Mil/uL (ref 4.22–5.81)
RDW: 13.7 % (ref 11.5–15.5)
WBC: 4.2 10*3/uL (ref 4.0–10.5)

## 2017-04-23 LAB — COMPREHENSIVE METABOLIC PANEL
ALBUMIN: 4.4 g/dL (ref 3.5–5.2)
ALT: 25 U/L (ref 0–53)
AST: 24 U/L (ref 0–37)
Alkaline Phosphatase: 71 U/L (ref 39–117)
BUN: 16 mg/dL (ref 6–23)
CALCIUM: 9.2 mg/dL (ref 8.4–10.5)
CHLORIDE: 104 meq/L (ref 96–112)
CO2: 28 meq/L (ref 19–32)
CREATININE: 0.67 mg/dL (ref 0.40–1.50)
GFR: 120.35 mL/min (ref >60.00)
Glucose, Bld: 181 mg/dL — ABNORMAL HIGH (ref 70–99)
POTASSIUM: 4 meq/L (ref 3.5–5.1)
Sodium: 141 mEq/L (ref 135–145)
Total Bilirubin: 0.8 mg/dL (ref 0.2–1.2)
Total Protein: 6.5 g/dL (ref 6.0–8.3)

## 2017-04-23 MED ORDER — FAMCICLOVIR 500 MG PO TABS: 500.0000 mg | ORAL_TABLET | Freq: Three times a day (TID) | ORAL | 0 refills | Status: AC

## 2017-04-23 MED ORDER — PREDNISONE 20 MG PO TABS: ORAL_TABLET | ORAL | 0 refills | Status: AC

## 2017-04-23 MED ORDER — LIDOCAINE 4 % EX CREA: 1.0000 "application " | TOPICAL_CREAM | Freq: Three times a day (TID) | CUTANEOUS | 0 refills | Status: AC | PRN

## 2017-04-23 NOTE — Patient Instructions (Signed)
A few things to remember from today's visit:   RUQ abdominal pain - Plan: US Abdomen Limited RUQ, CBC with Differential/Platelet, Comprehensive metabolic panel  Skin sensation disturbance - Plan: lidocaine (LMX) 4 % cream  Leg pain, anterior, right - Plan: predniSONE (DELTASONE) 20 MG tablet  Rash and nonspecific skin eruption - Plan: famciclovir (FAMVIR) 500 MG tablet   Please be sure medication list is accurate. If a new problem present, please set up appointment sooner than planned today.

## 2017-04-23 NOTE — Progress Notes (Signed)
ACUTE VISIT   HPI:  Chief Complaint  Patient presents with  . Right-sided pain    groin and right leg, started 2 weeks ago    [name] is a 84 y.o. male, who is here today complaining of about 2 weeks of right leg "nerve pain" that has extended to right groin area and right side of his abdomen.  He cannot describe type of pain, "inside leg" and sensitive skin of affected areas.  Right hip and anterior thigh pain. Pain is exacerbated by movement,alliviated by rest. No Hx of fall or recent injury.  According to patient, he has had similar symptoms on his extremity for years, intermittently but has been more frequent for the past few weeks.  Problem in groin area and abdomen are new.  + High alcohol intake.  He has no noted fever, chills, changes in bowel habits, nausea, vomiting, urinary symptoms, or rash on affected area. History of lower back pain, he denies saddle anesthesia. No focal weakness.  History of urine and occasional fecal incontinence, both otherwise stable.  He mentions a rash in the right groin area, which he also has during this past summer and associated to heat.  He has taken OTC Ibuprofen.  Lab Results  Component Value Date   ALT 26 07/03/2016   AST 25 07/03/2016   ALKPHOS 68 07/03/2016   BILITOT 0.7 07/03/2016       He has had pruritic rash for about a year, he has followed-up with dermatology uncommonly he is applying OTC Hydrocortisone.   Review of Systems  Constitutional: Positive for fatigue (No more than usual). Negative for appetite change, fever and unexpected weight change.  HENT: Negative for facial swelling, mouth sores, nosebleeds and trouble swallowing.   Eyes: Negative for redness and visual disturbance.  Respiratory: Negative for cough, shortness of breath and wheezing.   Cardiovascular: Negative for palpitations and leg swelling.  Gastrointestinal: Positive for abdominal pain. Negative for blood in stool,  constipation, nausea and vomiting.  Endocrine: Negative for polydipsia, polyphagia and polyuria.  Genitourinary: Negative for decreased urine volume, dysuria and hematuria.  Musculoskeletal: Positive for arthralgias, back pain and gait problem.  Skin: Positive for rash. Negative for wound.  Neurological: Negative for syncope and headaches.  Psychiatric/Behavioral: Negative for confusion. The patient is nervous/anxious.       Current Outpatient Medications on File Prior to Visit  Medication Sig Dispense Refill  . ALPRAZolam (XANAX) 0.25 MG tablet TAKE 1/2 - 1 TABLET BY MOUTH AT BEDTIME AS NEEDED 30 tablet 1  . baclofen (LIORESAL) 10 MG tablet Take 1 tablet (10 mg total) by mouth at bedtime. 90 tablet 1  . diphenoxylate-atropine (LOMOTIL) 2.5-0.025 MG tablet Take 1 tablet by mouth 3 (three) times daily as needed for diarrhea or loose stools. 90 tablet 2  . doxazosin (CARDURA) 2 MG tablet Take 1 tablet (2 mg total) by mouth 2 (two) times daily. 180 tablet 1  . finasteride (PROSCAR) 5 MG tablet Take 1 tablet (5 mg total) by mouth daily. 90 tablet 2  . furosemide (LASIX) 40 MG tablet Take 1 tablet (40 mg total) by mouth daily. You may take 1 extra lasix in the evening if you have SOB, LEE, and weight gain > 3lbs in 24 hours 100 tablet 2  . gabapentin (NEURONTIN) 300 MG capsule Take 1 capsule (300 mg total) by mouth 3 (three) times daily. (Patient taking differently: Take 900 mg by mouth 3 (three) times daily. ) 270  capsule 1  . glipiZIDE (GLUCOTROL) 5 MG tablet TAKE 1 TABLET BY MOUTH TWICE A DAY BEFORE A MEAL 160 tablet 1  . Glucose Blood (FREESTYLE LITE TEST VI)     . ketoconazole (NIZORAL) 2 % cream Apply 1 application topically daily. 15 g 0  . metoprolol succinate (TOPROL-XL) 25 MG 24 hr tablet Take 1 tablet (25 mg total) by mouth daily. 90 tablet 1  . ONE TOUCH ULTRA TEST test strip USE TO CHECK BLOOD SUGAR DAILY AND PRN 100 each 4  . ONETOUCH DELICA LANCETS 02H MISC USE TO CHECK BLOOD SUGAR  DAILY AND PRN 100 each 4  . sertraline (ZOLOFT) 50 MG tablet Take 1 tablet (50 mg total) daily by mouth. 90 tablet 1  . tamsulosin (FLOMAX) 0.4 MG CAPS capsule Take 1 capsule (0.4 mg total) by mouth daily after supper. 90 capsule 2  . valsartan (DIOVAN) 80 MG tablet Take 1 tablet (80 mg total) by mouth daily. 30 tablet 2   Current Facility-Administered Medications on File Prior to Visit  Medication Dose Route Frequency Provider Last Rate Last Dose  . triamcinolone acetonide (KENALOG-40) injection 40 mg  40 mg Intramuscular Once [name], Daronte Shostak G, MD         Past Medical History:  Diagnosis Date  . BACK PAIN, CHRONIC 04/18/2009  . BENIGN PROSTATIC HYPERTROPHY, HX OF 01/19/2007  . Carpal tunnel syndrome 03/24/2007  . CORONARY ARTERY DISEASE 01/19/2007  . DEPRESSION, CHRONIC 06/24/2007  . DIABETES MELLITUS 01/19/2007  . DIABETES MELLITUS, TYPE II, WITH NEUROLOGICAL COMPLICATIONS 11/15/2776  . Diarrhea 02/23/2007  . HEART MURMUR, SYSTOLIC 2/42/3536  . HERPES SIMPLEX INFECTION 01/19/2007  . HYPERLIPIDEMIA 01/19/2007  . HYPERTENSION 01/19/2007  . Intermediate coronary syndrome ([place]) 08/29/2008  . LEG CRAMPS, NOCTURNAL 10/28/2007  . Leg cramps, sleep related 08/05/2010  . OBSTRUCTIVE SLEEP APNEA 01/19/2007   does not use CPAP  . OSTEOARTHRITIS 06/24/2007  . Other postprocedural status(V45.89) 01/19/2007  . PERCUTANEOUS TRANSLUMINAL CORONARY ANGIOPLASTY, HX OF 01/19/2007  . PLANTAR FASCIITIS, RIGHT 01/19/2007  . SHOULDER PAIN, LEFT 03/24/2007  . SPINAL STENOSIS, LUMBAR 06/03/2009  . TINEA PEDIS 11/15/2009  . URI 01/11/2009   No Known Allergies  Social History   Socioeconomic History  . Marital status: Married    Spouse name: None  . Number of children: None  . Years of education: None  . Highest education level: None  Social Needs  . Financial resource strain: None  . Food insecurity - worry: None  . Food insecurity - inability: None  . Transportation needs - medical: None  . Transportation  needs - non-medical: None  Occupational History  . Occupation: retired    Comment: Truck Research officer, political party  Tobacco Use  . Smoking status: Former Smoker    Last attempt to quit: 04/14/1983    Years since quitting: 34.0  . Smokeless tobacco: Never Used  . Tobacco comment: stopped 1985  Substance and Sexual Activity  . Alcohol use: Yes    Comment: 24 oz daily wine  . Drug use: Yes    Types: Marijuana  . Sexual activity: None  Other Topics Concern  . None  Social History Narrative   Married in 1959 - 74yrs, divorced; married 1974 - 1.5 years, divorced; married 1985 1 daughter 21; 18 step daughters; 2 grandchildren.    Vitals:   04/23/17 0749  BP: (!) 142/80  Pulse: 70  Resp: 16  Temp: 97.6 F (36.4 C)  SpO2: 97%   Body mass index is 29.7 kg/m.  Physical Exam  Nursing note and vitals reviewed. Constitutional: He is oriented to person, place, and time. He appears well-developed. No distress.  HENT:  Head: Normocephalic and atraumatic.  Mouth/Throat: Oropharynx is clear and moist and mucous membranes are normal.  Eyes: Conjunctivae are normal. Pupils are equal, round, and reactive to light.  Cardiovascular: Normal rate and regular rhythm.  Murmur (SEM I-II LUSB and RUSB) heard. DP pulses present,bilateral.  Respiratory: Effort normal and breath sounds normal. No respiratory distress.  GI: Soft. He exhibits no mass. There is tenderness. There is no rebound and no guarding.  Musculoskeletal: He exhibits edema (1+ pitting LE edema,bilateral.).       Thoracic back: He exhibits no tenderness.       Lumbar back: He exhibits tenderness. He exhibits no bony tenderness.  Right hip pain is not elicited with movement.  Lymphadenopathy:    He has no cervical adenopathy.  Neurological: He is alert and oriented to person, place, and time. He has normal strength.  Skin: Skin is warm. Rash noted. Rash is macular and maculopapular. No erythema.     Right groin macular erythema,no  tender. Scattered on back and LE's macular,scaly,mildly erythematous rash. On right flank and low back a few maculopapular erythematous rash, not tender (#2)  There are a few papular erythematous lesion on sacrum,confluent and mildly tender with palpation. (#1)  Psychiatric: His mood appears anxious.     ASSESSMENT AND PLAN:   Mr. Keanu was seen today for right-sided pain.  Diagnoses and all orders for this visit:  Lab Results  Component Value Date   WBC 4.2 04/23/2017   HGB 15.0 04/23/2017   HCT 44.4 04/23/2017   MCV 89.6 04/23/2017   PLT 133.0 (L) 04/23/2017   Lab Results  Component Value Date   ALT 25 04/23/2017   AST 24 04/23/2017   ALKPHOS 71 04/23/2017   BILITOT 0.8 04/23/2017   Lab Results  Component Value Date   CREATININE 0.67 04/23/2017   BUN 16 04/23/2017   NA 141 04/23/2017   K 4.0 04/23/2017   CL 104 04/23/2017   CO2 28 04/23/2017    RUQ abdominal pain  Possible etiologies discussed: ? Radicular,GI, neuropathic,and musculoskeletal among some. Clearly instructed about warning signs. Further recommendations will be given according to labs/imaging results.  -     US Abdomen Limited RUQ; Future -     CBC with Differential/Platelet -     Comprehensive metabolic panel  Skin sensation disturbance  Sensitive to light touch on some areas on right low back and right-sided abdomen. ? Zoster , radicular pain. Topical Lidocaine recommended. Instructed about warning signs.  -     lidocaine (LMX) 4 % cream; Apply 1 application topically 3 (three) times daily as needed for up to 14 days.  Leg pain, anterior, right  ? Radicular pain, OA among some. Peripheral pulses present.  He has tolerated Prednisone before, taper recommended. Instructed about warning signs. May consider lumbar MRI if not better in 4-5 weeks,before of worsening pain.  -     predniSONE (DELTASONE) 20 MG tablet; 3 tabs for 3 days, 2 tabs for 3 days, 1 tabs for 3 days, and 1/2 tab for  3 days. Take tables together with breakfast.  Rash and nonspecific skin eruption  Poor historian. Some rash I have seen before but some lesions seem new, he is not sure. I am treating with antiviral medication ,? Zoster.  -     famciclovir (FAMVIR) 500 MG tablet; Take  1 tablet (500 mg total) by mouth 3 (three) times daily for 7 days.    Return in about 1 week (around 04/30/2017).     -[name] was advised to seek immediate medical attention if sudden worsening symptoms.      Samyak Sackmann G. [name], MD  [facility]. [place] office.

## 2017-04-28 ENCOUNTER — Ambulatory Visit: Payer: Self-pay | Admitting: Family Medicine

## 2017-04-30 ENCOUNTER — Encounter: Payer: Self-pay | Admitting: Family Medicine

## 2017-05-01 ENCOUNTER — Encounter: Payer: Self-pay | Admitting: Family Medicine

## 2017-05-03 ENCOUNTER — Ambulatory Visit (INDEPENDENT_AMBULATORY_CARE_PROVIDER_SITE_OTHER): Payer: Medicare HMO | Admitting: Family Medicine

## 2017-05-03 ENCOUNTER — Encounter: Payer: Self-pay | Admitting: Family Medicine

## 2017-05-03 VITALS — BP 120/82 | HR 61 | Temp 98.3°F | Resp 16 | Ht 65.0 in | Wt 176.6 lb

## 2017-05-03 DIAGNOSIS — E1140 Type 2 diabetes mellitus with diabetic neuropathy, unspecified: Secondary | ICD-10-CM | POA: Diagnosis not present

## 2017-05-03 DIAGNOSIS — R1011 Right upper quadrant pain: Secondary | ICD-10-CM | POA: Diagnosis not present

## 2017-05-03 DIAGNOSIS — I119 Hypertensive heart disease without heart failure: Secondary | ICD-10-CM

## 2017-05-03 DIAGNOSIS — M79604 Pain in right leg: Secondary | ICD-10-CM

## 2017-05-03 NOTE — Progress Notes (Signed)
HPI:   [name] is a 84 y.o. male, who is here today with his wife to follow on recent OV.   He was seen on 1/11 2019, when he was complaining of right sided abdominal pain and "nerve pain" as well as sensitive skin on right lower extremity as well as RLQ.  At the time of the visit he had some erythema does papular rash on sacral area and skin was tender upon light touch, following dermatomal distribution.  So I recommended antiviral treatment with Famciclovir. History of lumbar stenosis, RLE symptoms thought to be radicular pain, short course of Prednisone was also recommended.  He has not had RUQ US done yet. Abdominal pain has resolved. Rash has improved. LE "nerve" pain has also resolved.  Overall he tolerated meds well and states that BS's were better when he was on Prednisone.   Lab Results  Component Value Date   HGBA1C 7.0 04/05/2017   BS's 90's-low 100's He is on Glipizide 5 mg twice daily. He denies hypoglycemia.  No changes in bowel habits, nausea, vomiting, dysuria, or decreased urine output. History of chronic diarrhea, IBS, stable.  HTN: Concerned about BP's. He had some elevated BP readings at home.  History of diastolic dysfunction, he denies orthopnea or PND.  BP 168/90 x 1 and 136/85  He denies headache, chest pain, dyspnea, or worsening edema.  He is currently on valsartan 80 mg daily and Metoprolol Succinate 25 mg daily.  Review of Systems  Constitutional: Positive for fatigue (At his baseline.). Negative for activity change, appetite change and fever.  HENT: Negative for nosebleeds, sore throat and trouble swallowing.   Eyes: Negative for redness and visual disturbance.  Respiratory: Negative for cough, shortness of breath and wheezing.   Cardiovascular: Positive for leg swelling. Negative for chest pain and palpitations.  Gastrointestinal: Negative for abdominal pain, nausea and vomiting.       No changes in bowel habits.    Endocrine: Negative for polydipsia, polyphagia and polyuria.  Genitourinary: Negative for decreased urine volume, dysuria and hematuria.  Musculoskeletal: Positive for arthralgias, back pain and gait problem.  Skin: Positive for rash. Negative for wound.  Neurological: Negative for syncope, weakness, numbness and headaches.  Psychiatric/Behavioral: Positive for sleep disturbance. Negative for confusion. The patient is nervous/anxious.       Current Outpatient Medications on File Prior to Visit  Medication Sig Dispense Refill  . ALPRAZolam (XANAX) 0.25 MG tablet TAKE 1/2 - 1 TABLET BY MOUTH AT BEDTIME AS NEEDED 30 tablet 1  . baclofen (LIORESAL) 10 MG tablet Take 1 tablet (10 mg total) by mouth at bedtime. 90 tablet 1  . diphenoxylate-atropine (LOMOTIL) 2.5-0.025 MG tablet Take 1 tablet by mouth 3 (three) times daily as needed for diarrhea or loose stools. 90 tablet 2  . doxazosin (CARDURA) 2 MG tablet Take 1 tablet (2 mg total) by mouth 2 (two) times daily. 180 tablet 1  . finasteride (PROSCAR) 5 MG tablet Take 1 tablet (5 mg total) by mouth daily. 90 tablet 2  . furosemide (LASIX) 40 MG tablet Take 1 tablet (40 mg total) by mouth daily. You may take 1 extra lasix in the evening if you have SOB, LEE, and weight gain > 3lbs in 24 hours 100 tablet 2  . gabapentin (NEURONTIN) 300 MG capsule Take 1 capsule (300 mg total) by mouth 3 (three) times daily. (Patient taking differently: Take 900 mg by mouth 3 (three) times daily. ) 270 capsule 1  .  glipiZIDE (GLUCOTROL) 5 MG tablet TAKE 1 TABLET BY MOUTH TWICE A DAY BEFORE A MEAL 160 tablet 1  . Glucose Blood (FREESTYLE LITE TEST VI)     . ketoconazole (NIZORAL) 2 % cream Apply 1 application topically daily. 15 g 0  . metoprolol succinate (TOPROL-XL) 25 MG 24 hr tablet Take 1 tablet (25 mg total) by mouth daily. 90 tablet 1  . sertraline (ZOLOFT) 50 MG tablet Take 1 tablet (50 mg total) daily by mouth. 90 tablet 1  . tamsulosin (FLOMAX) 0.4 MG CAPS  capsule Take 1 capsule (0.4 mg total) by mouth daily after supper. 90 capsule 2  . valsartan (DIOVAN) 80 MG tablet Take 1 tablet (80 mg total) by mouth daily. 30 tablet 2   Current Facility-Administered Medications on File Prior to Visit  Medication Dose Route Frequency Provider Last Rate Last Dose  . triamcinolone acetonide (KENALOG-40) injection 40 mg  40 mg Intramuscular Once [name], Aerika Groll G, MD         Past Medical History:  Diagnosis Date  . BACK PAIN, CHRONIC 04/18/2009  . BENIGN PROSTATIC HYPERTROPHY, HX OF 01/19/2007  . Carpal tunnel syndrome 03/24/2007  . CORONARY ARTERY DISEASE 01/19/2007  . DEPRESSION, CHRONIC 06/24/2007  . DIABETES MELLITUS 01/19/2007  . DIABETES MELLITUS, TYPE II, WITH NEUROLOGICAL COMPLICATIONS 04/18/1094  . Diarrhea 02/23/2007  . HEART MURMUR, SYSTOLIC 0/45/4098  . HERPES SIMPLEX INFECTION 01/19/2007  . HYPERLIPIDEMIA 01/19/2007  . HYPERTENSION 01/19/2007  . Intermediate coronary syndrome ([place]) 08/29/2008  . LEG CRAMPS, NOCTURNAL 10/28/2007  . Leg cramps, sleep related 08/05/2010  . OBSTRUCTIVE SLEEP APNEA 01/19/2007   does not use CPAP  . OSTEOARTHRITIS 06/24/2007  . Other postprocedural status(V45.89) 01/19/2007  . PERCUTANEOUS TRANSLUMINAL CORONARY ANGIOPLASTY, HX OF 01/19/2007  . PLANTAR FASCIITIS, RIGHT 01/19/2007  . SHOULDER PAIN, LEFT 03/24/2007  . SPINAL STENOSIS, LUMBAR 06/03/2009  . TINEA PEDIS 11/15/2009  . URI 01/11/2009   No Known Allergies  Social History   Socioeconomic History  . Marital status: Married    Spouse name: None  . Number of children: None  . Years of education: None  . Highest education level: None  Social Needs  . Financial resource strain: None  . Food insecurity - worry: None  . Food insecurity - inability: None  . Transportation needs - medical: None  . Transportation needs - non-medical: None  Occupational History  . Occupation: retired    Comment: Truck Research officer, political party  Tobacco Use  . Smoking status: Former Smoker     Last attempt to quit: 04/14/1983    Years since quitting: 34.0  . Smokeless tobacco: Never Used  . Tobacco comment: stopped 1985  Substance and Sexual Activity  . Alcohol use: Yes    Comment: 24 oz daily wine  . Drug use: Yes    Types: Marijuana  . Sexual activity: None  Other Topics Concern  . None  Social History Narrative   Married in 1959 - 44yrs, divorced; married 1974 - 1.5 years, divorced; married 1985 1 daughter 48; 75 step daughters; 2 grandchildren.    Vitals:   05/03/17 1150  BP: 120/82  Pulse: 61  Resp: 16  Temp: 98.3 F (36.8 C)  SpO2: 97%   Body mass index is 29.39 kg/m.   Physical Exam  Nursing note and vitals reviewed. Constitutional: He is oriented to person, place, and time. He appears well-developed. No distress.  HENT:  Head: Normocephalic and atraumatic.  Mouth/Throat: Oropharynx is clear and moist and mucous membranes are  normal.  Eyes: Conjunctivae are normal. Pupils are equal, round, and reactive to light.  Neck: No thyroid mass present.  Cardiovascular: Normal rate and regular rhythm.  Murmur (SEM I/VI RUSB) heard. Pulses:      Dorsalis pedis pulses are 2+ on the right side, and 2+ on the left side.  Respiratory: Effort normal and breath sounds normal. No respiratory distress.  GI: Soft. He exhibits no mass. There is no hepatomegaly. There is no tenderness.  Musculoskeletal: He exhibits edema (1+ pitting LE edema,bilateral.). He exhibits no tenderness.  Lymphadenopathy:    He has no cervical adenopathy.  Neurological: He is alert and oriented to person, place, and time. He has normal strength.  Unstable gait assisted with a cane.  Skin: Skin is warm. No erythema.  Crusty rounded lesions on sacrum, where papular rash was  Last visit. No tenderness.  Psychiatric: His mood appears anxious.  Well groomed, good eye contact.     ASSESSMENT AND PLAN:   Mr. Johnston was seen today for follow-up.  Diagnoses and all orders for this  visit:  Leg pain, anterior, right  Resolved. Most likely related to lumbar stenosis. Instructed about warning signs.  RUQ abdominal pain  Resolved. RUQ [place] is being arranged, pending.  Type 2 diabetes mellitus with diabetic neuropathy, without long-term current use of insulin (HCC)  No changes in current management. Caution with hypoglycemia. Follow-up in 3-4 months.  Hypertension with heart disease  Today BP is adequately controlled. Continue monitoring BP at home. No changes in current management. Follow-up in 3-4 months.     Seyed Heffley G. [name], MD  [facility]. [place] office.

## 2017-05-03 NOTE — Patient Instructions (Signed)
A few things to remember from today's visit:   Leg pain, anterior, right  RUQ abdominal pain   Resolved,so I see you back in 07/2017 to follow on diabetes.     Please be sure medication list is accurate. If a new problem present, please set up appointment sooner than planned today.

## 2017-05-04 ENCOUNTER — Other Ambulatory Visit: Payer: Self-pay | Admitting: Family Medicine

## 2017-05-04 ENCOUNTER — Other Ambulatory Visit: Payer: Self-pay | Admitting: *Deleted

## 2017-05-04 DIAGNOSIS — R69 Illness, unspecified: Secondary | ICD-10-CM | POA: Diagnosis not present

## 2017-05-04 MED ORDER — ONETOUCH DELICA LANCETS 33G MISC: 4 refills | Status: DC

## 2017-05-04 MED ORDER — ONETOUCH ULTRA BLUE VI STRP: ORAL_STRIP | 4 refills | Status: DC

## 2017-05-07 DIAGNOSIS — H401132 Primary open-angle glaucoma, bilateral, moderate stage: Secondary | ICD-10-CM | POA: Diagnosis not present

## 2017-05-17 ENCOUNTER — Ambulatory Visit
Admission: RE | Admit: 2017-05-17 | Discharge: 2017-05-17 | Disposition: A | Discharge status: Home / Self Care | Payer: Medicare HMO | Source: Ambulatory Visit | Attending: Family Medicine | Admitting: Family Medicine

## 2017-05-17 DIAGNOSIS — R1011 Right upper quadrant pain: Secondary | ICD-10-CM

## 2017-05-17 DIAGNOSIS — K760 Fatty (change of) liver, not elsewhere classified: Secondary | ICD-10-CM | POA: Diagnosis not present

## 2017-05-25 ENCOUNTER — Encounter: Payer: Self-pay | Admitting: Family Medicine

## 2017-06-04 DIAGNOSIS — H2513 Age-related nuclear cataract, bilateral: Secondary | ICD-10-CM | POA: Diagnosis not present

## 2017-06-04 DIAGNOSIS — H401132 Primary open-angle glaucoma, bilateral, moderate stage: Secondary | ICD-10-CM | POA: Diagnosis not present

## 2017-06-05 ENCOUNTER — Encounter: Payer: Self-pay | Admitting: Family Medicine

## 2017-06-08 ENCOUNTER — Other Ambulatory Visit: Payer: Self-pay | Admitting: Family Medicine

## 2017-06-08 DIAGNOSIS — K529 Noninfective gastroenteritis and colitis, unspecified: Secondary | ICD-10-CM

## 2017-06-08 MED ORDER — DIPHENOXYLATE-ATROPINE 2.5-0.025 MG PO TABS: 1.0000 | ORAL_TABLET | Freq: Three times a day (TID) | ORAL | 2 refills | Status: DC | PRN

## 2017-06-08 MED ORDER — ALPRAZOLAM 0.25 MG PO TABS: ORAL_TABLET | ORAL | 1 refills | Status: DC

## 2017-06-09 ENCOUNTER — Other Ambulatory Visit: Payer: Self-pay | Admitting: *Deleted

## 2017-06-09 DIAGNOSIS — I119 Hypertensive heart disease without heart failure: Secondary | ICD-10-CM

## 2017-06-09 MED ORDER — VALSARTAN 80 MG PO TABS: 80.0000 mg | ORAL_TABLET | Freq: Every day | ORAL | 2 refills | Status: DC

## 2017-06-23 ENCOUNTER — Other Ambulatory Visit: Payer: Self-pay | Admitting: *Deleted

## 2017-06-23 ENCOUNTER — Telehealth: Payer: Self-pay | Admitting: Family Medicine

## 2017-06-23 DIAGNOSIS — E1140 Type 2 diabetes mellitus with diabetic neuropathy, unspecified: Secondary | ICD-10-CM

## 2017-06-23 MED ORDER — GLIPIZIDE 5 MG PO TABS: ORAL_TABLET | ORAL | 2 refills | Status: DC

## 2017-06-23 NOTE — Telephone Encounter (Signed)
Patient uses his medication twice daily and needs the # changed to 180. Please correct his Rx and resend for him.

## 2017-06-23 NOTE — Telephone Encounter (Signed)
Copied from [place]. Topic: Quick Communication - Rx Refill/Question >> Jun 23, 2017  1:15 PM Scherrie Gerlach wrote: Medication: glipiZIDE (GLUCOTROL) 5 MG tablet  [facility] # [id number], [place] [phone] ([phone] (Fax)  Is calling to request this Rx be sent as a 90 day, and insurance will pay. It was sent last for a 80 day.  Can you please change and resend? thanks

## 2017-06-23 NOTE — Telephone Encounter (Signed)
Rx sent to pharmacy as requested.

## 2017-07-20 NOTE — Progress Notes (Signed)
HPI:   [name] is a 84 y.o. male, who is here today with his wife for 3 months follow up.   He was last seen on 05/03/17.  Hypertension:   Currently on Metoprolol Succinate 25 mg daily and Valsartan 80 mg daily.  He takes Doxazosin at night for BPH symptoms.    Taking medications as instructed, no side effects reported.  He has not noted unusual headache, visual changes, exertional chest pain,  focal weakness, or worsening edema.   Lab Results  Component Value Date   CREATININE 0.67 04/23/2017   BUN 16 04/23/2017   NA 141 04/23/2017   K 4.0 04/23/2017   CL 104 04/23/2017   CO2 28 04/23/2017   + Fatigue, stable otherwise. OSA,he did not tolerate CPAP,he tried twice.  Diabetes Mellitus II:    Currently on Glipizide 5 mg daily.. Checking BS's : 130's-low 200's Hypoglycemia:No  He is tolerating medications well. No unusual abdominal pain, nausea, vomiting, polydipsia, polyuria, or polyphagia.  + Peripheral neuropathy,he is on Gabapentin 300 mg TID. He does not think Gabapentin is helping with LE neuropathic pain.    Lab Results  Component Value Date   HGBA1C 7.0 04/05/2017   Lab Results  Component Value Date   MICROALBUR 0.7 07/03/2016   Gabapentin was also recommended for cramps, mainly LE's, but doe snot seem to be helping with these problem either.  I did prescribe Zanaflex 2 mg before and [name] recently recommended Baclofen 10 mg, he took both one night and noted that cramps did not wake him up that night. He is not sure which one helps the most.  + LE edema, no erythema. + Diastolic dysfunction. No orthopnea or PND.  He is concerned about SOB, which he feels like slowly getting worse. No associated wheezing or cough. No chest pain,palpitations,or diaphoresis.  Chronic pain: Generalized OA. He is taking Ibuprofen 400 mg tid and seems to help. He was following with [name] but did not continue due to cost.   Hx of COPD. Former  smoker.  He has not used Albuterol inh, which was recommended a few visits ago prn.  SOB is alleviated by rest and exacerbated by walking less than 1/2 block. No fever,chills,or worsening fatigue.   IBS diarrhea:  Currently he is on Lomotil 2.5-0.025 mg TID as needed, cost went up with his insurance. According to his wife,he also takes OTC Imodium in between.  He has max 5-6 loose stools when symptomatic ,some days he does not have any. No blood noted.  He has not identified exacerbating or alleviating factors. Did not change much after discontinuing Metformin.  Problem otherwise stable.   Anxiety and depression: He takes Alprazolam 0.25 mg at bedtime as needed, he is not sure why he is taking it, he does not feel like he needs it.  Zoloft 50 mg daily has helped with irritability and anxiety, still having some. He thinks a higher dose will help. Well tolerated ,no suicidal thoughts.  For the past 3-4 months having hot flashes intermittently with sweating. He has not identified exacerbating or alleviating factors.  HLD:  He is on non pharmacologic treatment, he took statin meds in the past but did not tolerate well.  Lab Results  Component Value Date   CHOL 197 08/12/2015   HDL 37.50 (L) 08/12/2015   LDLCALC 50 03/14/2012   LDLDIRECT 92.0 08/12/2015   TRIG (H) 08/12/2015    411.0 Triglyceride is over 400; calculations  on Lipids are invalid.   CHOLHDL 5 08/12/2015     Review of Systems  Constitutional: Positive for fatigue. Negative for activity change, appetite change, chills, fever and unexpected weight change.  HENT: Negative for mouth sores, nosebleeds, sore throat and trouble swallowing.   Eyes: Negative for redness and visual disturbance.  Respiratory: Positive for shortness of breath. Negative for cough and wheezing.   Cardiovascular: Positive for leg swelling. Negative for chest pain and palpitations.  Gastrointestinal: Positive for diarrhea. Negative for  abdominal pain, nausea and vomiting.  Endocrine: Negative for polydipsia, polyphagia and polyuria.  Genitourinary: Negative for decreased urine volume, dysuria and hematuria.  Musculoskeletal: Positive for arthralgias, back pain, gait problem and myalgias.  Skin: Negative for rash and wound.  Allergic/Immunologic: Positive for environmental allergies.  Neurological: Negative for syncope, weakness and headaches.  Psychiatric/Behavioral: Positive for sleep disturbance. Negative for confusion. The patient is nervous/anxious.       Current Outpatient Medications on File Prior to Visit  Medication Sig Dispense Refill  . baclofen (LIORESAL) 10 MG tablet Take 1 tablet (10 mg total) by mouth at bedtime. 90 tablet 1  . doxazosin (CARDURA) 2 MG tablet Take 1 tablet (2 mg total) by mouth 2 (two) times daily. 180 tablet 1  . finasteride (PROSCAR) 5 MG tablet Take 1 tablet (5 mg total) by mouth daily. 90 tablet 2  . furosemide (LASIX) 40 MG tablet Take 1 tablet (40 mg total) by mouth daily. You may take 1 extra lasix in the evening if you have SOB, LEE, and weight gain > 3lbs in 24 hours 100 tablet 2  . gabapentin (NEURONTIN) 300 MG capsule Take 1 capsule (300 mg total) by mouth 3 (three) times daily. (Patient taking differently: Take 900 mg by mouth 3 (three) times daily. ) 270 capsule 1  . glipiZIDE (GLUCOTROL) 5 MG tablet TAKE 1 TABLET BY MOUTH TWICE A DAY BEFORE A MEAL 180 tablet 2  . Glucose Blood (FREESTYLE LITE TEST VI)     . ketoconazole (NIZORAL) 2 % cream Apply 1 application topically daily. 15 g 0  . metoprolol succinate (TOPROL-XL) 25 MG 24 hr tablet Take 1 tablet (25 mg total) by mouth daily. 90 tablet 1  . ONE TOUCH ULTRA TEST test strip USE TO CHECK BLOOD SUGAR DAILY AND PRN 100 each 4  . ONETOUCH DELICA LANCETS 76P MISC USE TO CHECK BLOOD SUGAR DAILY AND PRN 100 each 4  . tamsulosin (FLOMAX) 0.4 MG CAPS capsule Take 1 capsule (0.4 mg total) by mouth daily after supper. 90 capsule 2  .  valsartan (DIOVAN) 80 MG tablet Take 1 tablet (80 mg total) by mouth daily. 90 tablet 2   Current Facility-Administered Medications on File Prior to Visit  Medication Dose Route Frequency Provider Last Rate Last Dose  . triamcinolone acetonide (KENALOG-40) injection 40 mg  40 mg Intramuscular Once [name], [name], MD         Past Medical History:  Diagnosis Date  . BACK PAIN, CHRONIC 04/18/2009  . BENIGN PROSTATIC HYPERTROPHY, HX OF 01/19/2007  . Carpal tunnel syndrome 03/24/2007  . CORONARY ARTERY DISEASE 01/19/2007  . DEPRESSION, CHRONIC 06/24/2007  . DIABETES MELLITUS 01/19/2007  . DIABETES MELLITUS, TYPE II, WITH NEUROLOGICAL COMPLICATIONS 12/17/930  . Diarrhea 02/23/2007  . HEART MURMUR, SYSTOLIC 6/71/2458  . HERPES SIMPLEX INFECTION 01/19/2007  . HYPERLIPIDEMIA 01/19/2007  . HYPERTENSION 01/19/2007  . Intermediate coronary syndrome ([place]) 08/29/2008  . LEG CRAMPS, NOCTURNAL 10/28/2007  . Leg cramps, sleep related 08/05/2010  .  OBSTRUCTIVE SLEEP APNEA 01/19/2007   does not use CPAP  . OSTEOARTHRITIS 06/24/2007  . Other postprocedural status(V45.89) 01/19/2007  . PERCUTANEOUS TRANSLUMINAL CORONARY ANGIOPLASTY, HX OF 01/19/2007  . PLANTAR FASCIITIS, RIGHT 01/19/2007  . SHOULDER PAIN, LEFT 03/24/2007  . SPINAL STENOSIS, LUMBAR 06/03/2009  . TINEA PEDIS 11/15/2009  . URI 01/11/2009   No Known Allergies  Social History   Socioeconomic History  . Marital status: Married    Spouse name: Not on file  . Number of children: Not on file  . Years of education: Not on file  . Highest education level: Not on file  Occupational History  . Occupation: retired    Comment: Truck Research officer, political party  Social Needs  . Financial resource strain: Not on file  . Food insecurity:    Worry: Not on file    Inability: Not on file  . Transportation needs:    Medical: Not on file    Non-medical: Not on file  Tobacco Use  . Smoking status: Former Smoker    Last attempt to quit: 04/14/1983    Years since quitting:  34.3  . Smokeless tobacco: Never Used  . Tobacco comment: stopped 1985  Substance and Sexual Activity  . Alcohol use: Yes    Comment: 24 oz daily wine  . Drug use: Yes    Types: Marijuana  . Sexual activity: Not on file  Lifestyle  . Physical activity:    Days per week: Not on file    Minutes per session: Not on file  . Stress: Not on file  Relationships  . Social connections:    Talks on phone: Not on file    Gets together: Not on file    Attends religious service: Not on file    Active member of club or organization: Not on file    Attends meetings of clubs or organizations: Not on file    Relationship status: Not on file  Other Topics Concern  . Not on file  Social History Narrative   Married in 1959 - 61yrs, divorced; married 1974 - 1.5 years, divorced; married 1985 1 daughter 58; 75 step daughters; 2 grandchildren.    Vitals:   07/21/17 1025  BP: 133/70  Pulse: (!) 59  Resp: 16  Temp: 97.9 F (36.6 C)  SpO2: 98%   Body mass index is 29.87 kg/m.   Physical Exam  Nursing note and vitals reviewed. Constitutional: He is oriented to person, place, and time. He appears well-developed. No distress.  HENT:  Head: Normocephalic and atraumatic.  Mouth/Throat: Oropharynx is clear and moist and mucous membranes are normal.  Eyes: Pupils are equal, round, and reactive to light. Conjunctivae are normal.  Neck: No JVD present.  Cardiovascular: Regular rhythm. Bradycardia present.  Murmur (SEM I/VI RUSB-LUSB) heard. Pulses:      Dorsalis pedis pulses are 2+ on the right side, and 2+ on the left side.  Respiratory: Effort normal and breath sounds normal. No respiratory distress.  GI: Soft. He exhibits no mass. There is no hepatomegaly. There is no tenderness.  Musculoskeletal: He exhibits edema (1-2+ pitting LE edema,bilateral.).  Lymphadenopathy:    He has no cervical adenopathy.  Neurological: He is alert and oriented to person, place, and time. He has normal  strength.  Mildly unstable gait assisted by a cane.  Skin: Skin is warm. No erythema.  Psychiatric: His mood appears anxious.  Well groomed, good eye contact.       ASSESSMENT AND PLAN:   [name] was seen today for 3 months follow-up.  Orders Placed This Encounter  Procedures  . Lipid panel  . Hemoglobin A1c  . LDL cholesterol, direct   Lab Results  Component Value Date   HGBA1C 6.5 07/21/2017   Lab Results  Component Value Date   CHOL 213 (H) 07/21/2017   HDL 40.60 07/21/2017   LDLCALC 50 03/14/2012   LDLDIRECT 121.0 07/21/2017   TRIG 287.0 (H) 07/21/2017   CHOLHDL 5 07/21/2017     1. Diabetes mellitus type 2 with neurological manifestations ([place])  HgA1C at goal. No changes in current management. Regular physical activity as tolerated. Healthy diet with avoidance of added sugar food intake are recommended. Annual eye exam and foot care. F/U in 5-6 months  - Hemoglobin A1c  2. Hypertension with heart disease  Adequately controlled. No changes in current management. Low salt diet to continue.  Eye exam recommended annually. F/U in 6 months, before if needed.  3. Chronic diarrhea  Lomotil discontinued due to cost. He will continue OTC Imodium max 3-4 tabs daily. Non residual diet and OTC Metamucil tid may also help. F/U in 4 weeks.  4. Peripheral polyneuropathy  He is not sure if gabapentin is helping,so we will start decreasing dose as tolerated. Fall prevention.   5. Cramp of limb  I do not recommend taking both ,Zanaflex and Baclofen.He can take one for a few weeks and then the other one to determine which one helps the most. Some side effects discussed.   6. Pure hypercholesterolemia  Further recommendations will be given according to labs/imaging results. He has not tolerated stains in the past.  - Lipid panel  7. Chronic obstructive pulmonary disease, unspecified COPD type (HCC)  Symbicort 80-4.5 mcg bid recommended. Some  side effects discussed. Albuterol inh as needed.   If dyspnea continues we need to consider repeating echo.  F/U in 4 weeks.  8. Depression, major, recurrent, moderate ([place])  Improved. Sertraline increased from 50 mg to 100 mg.  Alprazolam to discontinue.  F/U in 4 weeks.     -Mr. Arvid Right Manon to return sooner than planned today if new concerns arise.       [name]. [name], MD  [facility]. [place] office.

## 2017-07-21 ENCOUNTER — Encounter: Payer: Self-pay | Admitting: Family Medicine

## 2017-07-21 ENCOUNTER — Ambulatory Visit (INDEPENDENT_AMBULATORY_CARE_PROVIDER_SITE_OTHER): Payer: Medicare HMO | Admitting: Family Medicine

## 2017-07-21 VITALS — BP 133/70 | HR 59 | Temp 97.9°F | Resp 16 | Ht 65.0 in | Wt 179.5 lb

## 2017-07-21 DIAGNOSIS — F331 Major depressive disorder, recurrent, moderate: Secondary | ICD-10-CM

## 2017-07-21 DIAGNOSIS — R69 Illness, unspecified: Secondary | ICD-10-CM | POA: Diagnosis not present

## 2017-07-21 DIAGNOSIS — I119 Hypertensive heart disease without heart failure: Secondary | ICD-10-CM

## 2017-07-21 DIAGNOSIS — E1149 Type 2 diabetes mellitus with other diabetic neurological complication: Secondary | ICD-10-CM | POA: Diagnosis not present

## 2017-07-21 DIAGNOSIS — R252 Cramp and spasm: Secondary | ICD-10-CM | POA: Diagnosis not present

## 2017-07-21 DIAGNOSIS — J449 Chronic obstructive pulmonary disease, unspecified: Secondary | ICD-10-CM | POA: Diagnosis not present

## 2017-07-21 DIAGNOSIS — E7800 Pure hypercholesterolemia, unspecified: Secondary | ICD-10-CM | POA: Diagnosis not present

## 2017-07-21 DIAGNOSIS — K529 Noninfective gastroenteritis and colitis, unspecified: Secondary | ICD-10-CM | POA: Diagnosis not present

## 2017-07-21 DIAGNOSIS — G629 Polyneuropathy, unspecified: Secondary | ICD-10-CM | POA: Diagnosis not present

## 2017-07-21 LAB — LIPID PANEL
Cholesterol: 213 mg/dL — ABNORMAL HIGH (ref 0–200)
HDL: 40.6 mg/dL (ref >39.00)
NONHDL: 172.27
TRIGLYCERIDES: 287 mg/dL — AB (ref 0.0–149.0)
Total CHOL/HDL Ratio: 5
VLDL: 57.4 mg/dL — ABNORMAL HIGH (ref 0.0–40.0)

## 2017-07-21 LAB — LDL CHOLESTEROL, DIRECT: Direct LDL: 121 mg/dL

## 2017-07-21 LAB — HEMOGLOBIN A1C: Hgb A1c MFr Bld: 6.5 % (ref 4.6–6.5)

## 2017-07-21 MED ORDER — SERTRALINE HCL 100 MG PO TABS: 100.0000 mg | ORAL_TABLET | Freq: Every day | ORAL | 1 refills | Status: DC

## 2017-07-21 NOTE — Patient Instructions (Addendum)
A few things to remember from today's visit:   Diabetes mellitus type 2 with neurological manifestations ([place]) - Plan: Hemoglobin A1c  Hypertension with heart disease  Chronic diarrhea  Peripheral polyneuropathy  Cramp of limb  Pure hypercholesterolemia - Plan: Lipid panel  COPD exacerbation ([place])  Stop Imodium and continue over the counter Imodium 3-4 times as needed. Sertraline increased from 50 mg to 100 mg. Alprazolam to stop.  Albuterol in as needed. Symbicort added today to use 2 puff 2 times per day.Rinse mouth.  Metamucil 3 times per day. Low residual diet.  Gabapentin decreased to once daily.  Ibuprofen 400 mg 3 times per day with food and Tylenol arthritis 3 times per day.   Please be sure medication list is accurate. If a new problem present, please set up appointment sooner than planned today.

## 2017-07-22 ENCOUNTER — Encounter: Payer: Self-pay | Admitting: Family Medicine

## 2017-07-23 ENCOUNTER — Encounter: Payer: Self-pay | Admitting: Family Medicine

## 2017-07-24 ENCOUNTER — Encounter: Payer: Self-pay | Admitting: Family Medicine

## 2017-07-24 MED ORDER — BUDESONIDE-FORMOTEROL FUMARATE 80-4.5 MCG/ACT IN AERO: 2.0000 | INHALATION_SPRAY | Freq: Two times a day (BID) | RESPIRATORY_TRACT | 3 refills | Status: DC

## 2017-08-11 ENCOUNTER — Encounter: Payer: Self-pay | Admitting: Family Medicine

## 2017-08-13 ENCOUNTER — Other Ambulatory Visit: Payer: Self-pay | Admitting: *Deleted

## 2017-08-13 DIAGNOSIS — R69 Illness, unspecified: Secondary | ICD-10-CM | POA: Diagnosis not present

## 2017-08-13 MED ORDER — ONETOUCH ULTRA BLUE VI STRP: ORAL_STRIP | 4 refills | Status: DC

## 2017-08-25 ENCOUNTER — Ambulatory Visit (INDEPENDENT_AMBULATORY_CARE_PROVIDER_SITE_OTHER): Payer: Medicare HMO | Admitting: Family Medicine

## 2017-08-25 ENCOUNTER — Encounter: Payer: Self-pay | Admitting: Family Medicine

## 2017-08-25 VITALS — BP 140/78 | HR 73 | Temp 98.5°F | Resp 16 | Ht 65.0 in | Wt 175.4 lb

## 2017-08-25 DIAGNOSIS — R69 Illness, unspecified: Secondary | ICD-10-CM | POA: Diagnosis not present

## 2017-08-25 DIAGNOSIS — F331 Major depressive disorder, recurrent, moderate: Secondary | ICD-10-CM

## 2017-08-25 DIAGNOSIS — M1711 Unilateral primary osteoarthritis, right knee: Secondary | ICD-10-CM | POA: Diagnosis not present

## 2017-08-25 DIAGNOSIS — J449 Chronic obstructive pulmonary disease, unspecified: Secondary | ICD-10-CM

## 2017-08-25 DIAGNOSIS — I119 Hypertensive heart disease without heart failure: Secondary | ICD-10-CM

## 2017-08-25 DIAGNOSIS — I5032 Chronic diastolic (congestive) heart failure: Secondary | ICD-10-CM

## 2017-08-25 DIAGNOSIS — R0609 Other forms of dyspnea: Secondary | ICD-10-CM | POA: Diagnosis not present

## 2017-08-25 DIAGNOSIS — R0600 Dyspnea, unspecified: Secondary | ICD-10-CM

## 2017-08-25 MED ORDER — DICLOFENAC SODIUM 1 % TD GEL: 4.0000 g | Freq: Four times a day (QID) | TRANSDERMAL | 3 refills | Status: DC

## 2017-08-25 NOTE — Assessment & Plan Note (Signed)
Echo will be arranged. Further recommendation will be given according to echo results. Continue metoprolol and furosemide. Continue low-salt diet.

## 2017-08-25 NOTE — Patient Instructions (Signed)
A few things to remember from today's visit:   Hypertension with heart disease - Plan: ECHOCARDIOGRAM COMPLETE  Chronic diastolic congestive heart failure ([place]) - Plan: ECHOCARDIOGRAM COMPLETE  Osteoarthritis of right knee, unspecified osteoarthritis type - Plan: diclofenac sodium (VOLTAREN) 1 % GEL  Chronic obstructive pulmonary disease, unspecified COPD type ([place]) - Plan: Ambulatory referral to Pulmonology   Please be sure medication list is accurate. If a new problem present, please set up appointment sooner than planned today.

## 2017-08-25 NOTE — Assessment & Plan Note (Addendum)
In the past he has not noted significant improvement in knee pain with intra-articular steroid injection.  We discussed some side effects of the steroids in general. He will try Voltaren gel. He is not interested in a walker or a scooter. Fall precautions also discussed.

## 2017-08-25 NOTE — Assessment & Plan Note (Signed)
Symptoms have not improved with Symbicort and albuterol. I recommend pulmonology evaluation, he is going to need a PFTs. For now no changes in current management.

## 2017-08-25 NOTE — Assessment & Plan Note (Signed)
BP otherwise adequate control today. No changes in metoprolol succinate 25 mg or valsartan 80 mg daily. He continues monitoring BP at home. I will see him back in 3 to 4 months.

## 2017-08-25 NOTE — Progress Notes (Signed)
HPI:   [name] is a 84 y.o. male, who is here today with his wife to follow on recent OV.   He was seen on 07/21/2013. Last visit he was concerned about dyspnea, getting worse. History of COPD.  Recommending Symbicort 80-4.5 mcg twice daily daily and albuterol as needed.  Symbicort did not help with symptoms, still having exertional dyspnea with occasional cough and wheezing. No associated palpitations or chest pain.  History of CAD and diastolic dysfunction.  He is not on statin medication, has not tolerated in the past and he has not been interested in resuming medication. He is on metoprolol succinate 25 mg daily and valsartan 80 mg daily. Lower extremity edema has been a stable, he is on furosemide 40 mg daily, which sometimes he skips when going out for long time, due to worsening urinary frequency.  Lab Results  Component Value Date   CREATININE 0.67 04/23/2017   BUN 16 04/23/2017   NA 141 04/23/2017   K 4.0 04/23/2017   CL 104 04/23/2017   CO2 28 04/23/2017    Last echocardiogram in 11/3417: Normal systolic function and wall motion.  Grade 1 diastolic dysfunction. Mild aortic valve stenosis and regurgitation.  He was evaluated for preop evaluation on 08/29/2015, at that time he was planning on total knee replacement.  Depression: Last visit we also increase sertraline from 50 mg 200 mg.  Alprazolam was discontinued. No suicidal thoughts.  IBS: Lamotrigine was discontinued due to cost, recommend continuing OTC Imodium and Metamucil 3 times daily.  He did not notice major difference with diarrhea frequency while taking Metamucil. Today he is complaining of worsening bilateral knee pain, he wonders if he needs to get another intra-articular injection.  In the past he has not noticed major benefit with steroid intra-articular knee injection, it seems to help for a few days. History of DM 2.  He was following with orthopedist, total knee replacement was  recommended, he could not afford device that was recommended to build up muscles around his knee before surgery. Knee pain is alleviated by rest and exacerbated by walking. He feels like a walker helps with his balance and some with knee pain but he does not want to use it. Knee pain and OA in general exacerbate depression, he feels like he "cannot do anything", not able to engage in activities he enjoys. He has not used topical Voltaren in a while.  He also mentions that he still has lower extremity pruritic rash, intermittently. Treatment recommended by dermatologist did not help much. Problem seems to be stable. He has not identified exacerbating factors.   Review of Systems  Constitutional: Positive for fatigue. Negative for activity change, appetite change and fever.  HENT: Negative for nosebleeds, sore throat and trouble swallowing.   Eyes: Negative for redness and visual disturbance.  Respiratory: Positive for cough, shortness of breath and wheezing. Negative for apnea.   Cardiovascular: Negative for chest pain, palpitations and leg swelling.  Gastrointestinal: Positive for diarrhea. Negative for abdominal pain, nausea and vomiting.       No changes in bowel habits.  Genitourinary: Negative for decreased urine volume, dysuria and hematuria.  Musculoskeletal: Positive for arthralgias and gait problem.  Skin: Positive for rash (LE,stable). Negative for wound.  Neurological: Negative for syncope, weakness and headaches.  Psychiatric/Behavioral: Positive for sleep disturbance. Negative for confusion and suicidal ideas. The patient is nervous/anxious.       Current Outpatient Medications on File Prior to  Visit  Medication Sig Dispense Refill  . ALPRAZolam (XANAX) 0.25 MG tablet Take 0.25 mg by mouth at bedtime as needed for anxiety.    . budesonide-formoterol (SYMBICORT) 80-4.5 MCG/ACT inhaler Inhale 2 puffs into the lungs 2 (two) times daily. 1 Inhaler 3  . doxazosin (CARDURA) 2  MG tablet Take 1 tablet (2 mg total) by mouth 2 (two) times daily. 180 tablet 1  . finasteride (PROSCAR) 5 MG tablet Take 1 tablet (5 mg total) by mouth daily. 90 tablet 2  . furosemide (LASIX) 40 MG tablet Take 1 tablet (40 mg total) by mouth daily. You may take 1 extra lasix in the evening if you have SOB, LEE, and weight gain > 3lbs in 24 hours 100 tablet 2  . glipiZIDE (GLUCOTROL) 5 MG tablet TAKE 1 TABLET BY MOUTH TWICE A DAY BEFORE A MEAL 180 tablet 2  . Glucose Blood (FREESTYLE LITE TEST VI)     . metoprolol succinate (TOPROL-XL) 25 MG 24 hr tablet Take 1 tablet (25 mg total) by mouth daily. 90 tablet 1  . ONE TOUCH ULTRA TEST test strip USE TO CHECK BLOOD SUGAR DAILY 100 each 4  . ONETOUCH DELICA LANCETS 26Z MISC USE TO CHECK BLOOD SUGAR DAILY AND PRN 100 each 4  . sertraline (ZOLOFT) 100 MG tablet Take 1 tablet (100 mg total) by mouth daily. 30 tablet 1  . tamsulosin (FLOMAX) 0.4 MG CAPS capsule Take 1 capsule (0.4 mg total) by mouth daily after supper. 90 capsule 2  . TIZANIDINE HCL PO Take 2 mg by mouth as needed.    . valsartan (DIOVAN) 80 MG tablet Take 1 tablet (80 mg total) by mouth daily. 90 tablet 2   Current Facility-Administered Medications on File Prior to Visit  Medication Dose Route Frequency Provider Last Rate Last Dose  . triamcinolone acetonide (KENALOG-40) injection 40 mg  40 mg Intramuscular Once [name], Alazay Leicht G, MD         Past Medical History:  Diagnosis Date  . BACK PAIN, CHRONIC 04/18/2009  . BENIGN PROSTATIC HYPERTROPHY, HX OF 01/19/2007  . Carpal tunnel syndrome 03/24/2007  . CORONARY ARTERY DISEASE 01/19/2007  . DEPRESSION, CHRONIC 06/24/2007  . DIABETES MELLITUS 01/19/2007  . DIABETES MELLITUS, TYPE II, WITH NEUROLOGICAL COMPLICATIONS 04/14/4578  . Diarrhea 02/23/2007  . HEART MURMUR, SYSTOLIC 9/98/3382  . HERPES SIMPLEX INFECTION 01/19/2007  . HYPERLIPIDEMIA 01/19/2007  . HYPERTENSION 01/19/2007  . Intermediate coronary syndrome ([place]) 08/29/2008  . LEG  CRAMPS, NOCTURNAL 10/28/2007  . Leg cramps, sleep related 08/05/2010  . OBSTRUCTIVE SLEEP APNEA 01/19/2007   does not use CPAP  . OSTEOARTHRITIS 06/24/2007  . Other postprocedural status(V45.89) 01/19/2007  . PERCUTANEOUS TRANSLUMINAL CORONARY ANGIOPLASTY, HX OF 01/19/2007  . PLANTAR FASCIITIS, RIGHT 01/19/2007  . SHOULDER PAIN, LEFT 03/24/2007  . SPINAL STENOSIS, LUMBAR 06/03/2009  . TINEA PEDIS 11/15/2009  . URI 01/11/2009   No Known Allergies  Social History   Socioeconomic History  . Marital status: Married    Spouse name: Not on file  . Number of children: Not on file  . Years of education: Not on file  . Highest education level: Not on file  Occupational History  . Occupation: retired    Comment: Truck Research officer, political party  Social Needs  . Financial resource strain: Not on file  . Food insecurity:    Worry: Not on file    Inability: Not on file  . Transportation needs:    Medical: Not on file    Non-medical: Not  on file  Tobacco Use  . Smoking status: Former Smoker    Last attempt to quit: 04/14/1983    Years since quitting: 34.3  . Smokeless tobacco: Never Used  . Tobacco comment: stopped 1985  Substance and Sexual Activity  . Alcohol use: Yes    Comment: 24 oz daily wine  . Drug use: Yes    Types: Marijuana  . Sexual activity: Not on file  Lifestyle  . Physical activity:    Days per week: Not on file    Minutes per session: Not on file  . Stress: Not on file  Relationships  . Social connections:    Talks on phone: Not on file    Gets together: Not on file    Attends religious service: Not on file    Active member of club or organization: Not on file    Attends meetings of clubs or organizations: Not on file    Relationship status: Not on file  Other Topics Concern  . Not on file  Social History Narrative   Married in 1959 - 5yrs, divorced; married 1974 - 1.5 years, divorced; married 1985 1 daughter 36; 77 step daughters; 2 grandchildren.   family history  includes Arthritis in his sister; Coronary artery disease in his other; Diabetes in his other.  Vitals:   08/25/17 1212  BP: 140/78  Pulse: 73  Resp: 16  Temp: 98.5 F (36.9 C)  SpO2: 95%   Body mass index is 29.18 kg/m.  Physical Exam  Nursing note and vitals reviewed. Constitutional: He is oriented to person, place, and time. He appears well-developed. No distress.  HENT:  Head: Normocephalic and atraumatic.  Mouth/Throat: Oropharynx is clear and moist and mucous membranes are normal.  Eyes: Pupils are equal, round, and reactive to light. Conjunctivae are normal.  Cardiovascular: Normal rate and regular rhythm.  Murmur (SEM II-III/VI, LUSB and RUSB) heard. Pulses:      Dorsalis pedis pulses are 2+ on the right side, and 2+ on the left side.  Respiratory: Effort normal and breath sounds normal. No respiratory distress.  GI: Soft. He exhibits no mass. There is no tenderness.  Musculoskeletal: He exhibits edema (+2 pitting LE edema,bilateral.). He exhibits no tenderness.  Lymphadenopathy:    He has no cervical adenopathy.  Neurological: He is alert and oriented to person, place, and time. He has normal strength. Gait abnormal.  Antalgic gait assisted with a cane.  Skin: Skin is warm. Rash noted. No erythema.  There is a known erythematosus scaly macular rash around the  right ankle.  Psychiatric: His mood appears anxious. Cognition and memory are normal.  Well groomed, good eye contact.    ASSESSMENT AND PLAN:  Mr. Franz was seen today for follow-up.  Orders Placed This Encounter  Procedures  . Ambulatory referral to Pulmonology  . ECHOCARDIOGRAM COMPLETE     Exertional dyspnea  Seems to be related to COPD,other possible etiologies discussed. Hx of CAD,has not had chest pain or diaphoresis. ? Deconditioning.  Echo will be arranged. Pulmonology referral placed.  -     Ambulatory referral to Pulmonology   Hypertension with heart disease BP otherwise  adequate control today. No changes in metoprolol succinate 25 mg or valsartan 80 mg daily. He continues monitoring BP at home. I will see him back in 3 to 4 months.  COPD exacerbation ([place]) Symptoms have not improved with Symbicort and albuterol. I recommend pulmonology evaluation, he is going to need a PFTs. For now no changes  in current management.  Knee osteoarthritis In the past he has not noted significant improvement in knee pain with intra-articular steroid injection.  We discussed some side effects of the steroids in general. He will try Voltaren gel. He is not interested in a walking or a scooter. Fall precautions also discussed.  Diastolic CHF ([place]) Echo will be arranged. Further recommendation will be given according to echo results. Continue metoprolol and furosemide. Continue low-salt diet.   Depression, major, recurrent, moderate ([place])  For now no changes in Sertraline, his wife has noted some improvement in mood with medication. Recommend considering psychotherapy, information given (handout).    12:33-1:15 pm face to face OV. > 50% was dedicated to discussion of Dx, prognosis, treatment options as well as some side effects of medications.      Klover Priestly G. [name], MD  [facility]. [place] office.

## 2017-08-31 ENCOUNTER — Other Ambulatory Visit: Payer: Self-pay

## 2017-08-31 ENCOUNTER — Ambulatory Visit (HOSPITAL_COMMUNITY): Payer: Medicare HMO | Attending: Cardiovascular Disease

## 2017-08-31 DIAGNOSIS — I5032 Chronic diastolic (congestive) heart failure: Secondary | ICD-10-CM | POA: Diagnosis not present

## 2017-08-31 DIAGNOSIS — E669 Obesity, unspecified: Secondary | ICD-10-CM | POA: Diagnosis not present

## 2017-08-31 DIAGNOSIS — I110 Hypertensive heart disease with heart failure: Secondary | ICD-10-CM | POA: Insufficient documentation

## 2017-08-31 DIAGNOSIS — E785 Hyperlipidemia, unspecified: Secondary | ICD-10-CM | POA: Diagnosis not present

## 2017-08-31 DIAGNOSIS — I2510 Atherosclerotic heart disease of native coronary artery without angina pectoris: Secondary | ICD-10-CM | POA: Diagnosis not present

## 2017-08-31 DIAGNOSIS — E119 Type 2 diabetes mellitus without complications: Secondary | ICD-10-CM | POA: Insufficient documentation

## 2017-08-31 DIAGNOSIS — Z6829 Body mass index (BMI) 29.0-29.9, adult: Secondary | ICD-10-CM | POA: Insufficient documentation

## 2017-08-31 DIAGNOSIS — I119 Hypertensive heart disease without heart failure: Secondary | ICD-10-CM | POA: Diagnosis not present

## 2017-08-31 DIAGNOSIS — I080 Rheumatic disorders of both mitral and aortic valves: Secondary | ICD-10-CM | POA: Insufficient documentation

## 2017-08-31 DIAGNOSIS — J449 Chronic obstructive pulmonary disease, unspecified: Secondary | ICD-10-CM | POA: Insufficient documentation

## 2017-09-02 DIAGNOSIS — H401231 Low-tension glaucoma, bilateral, mild stage: Secondary | ICD-10-CM | POA: Diagnosis not present

## 2017-09-02 DIAGNOSIS — H5213 Myopia, bilateral: Secondary | ICD-10-CM | POA: Diagnosis not present

## 2017-09-02 DIAGNOSIS — Z961 Presence of intraocular lens: Secondary | ICD-10-CM | POA: Diagnosis not present

## 2017-09-02 DIAGNOSIS — E119 Type 2 diabetes mellitus without complications: Secondary | ICD-10-CM | POA: Diagnosis not present

## 2017-09-02 DIAGNOSIS — H52223 Regular astigmatism, bilateral: Secondary | ICD-10-CM | POA: Diagnosis not present

## 2017-09-02 DIAGNOSIS — H25811 Combined forms of age-related cataract, right eye: Secondary | ICD-10-CM | POA: Diagnosis not present

## 2017-09-02 DIAGNOSIS — Z7984 Long term (current) use of oral hypoglycemic drugs: Secondary | ICD-10-CM | POA: Diagnosis not present

## 2017-09-07 ENCOUNTER — Encounter: Payer: Self-pay | Admitting: Family Medicine

## 2017-09-07 ENCOUNTER — Other Ambulatory Visit: Payer: Self-pay | Admitting: *Deleted

## 2017-09-07 DIAGNOSIS — I10 Essential (primary) hypertension: Secondary | ICD-10-CM

## 2017-09-07 DIAGNOSIS — N401 Enlarged prostate with lower urinary tract symptoms: Secondary | ICD-10-CM

## 2017-09-07 DIAGNOSIS — R351 Nocturia: Secondary | ICD-10-CM

## 2017-09-07 MED ORDER — FINASTERIDE 5 MG PO TABS: 5.0000 mg | ORAL_TABLET | Freq: Every day | ORAL | 2 refills | Status: DC

## 2017-09-07 MED ORDER — METOPROLOL SUCCINATE ER 25 MG PO TB24: 25.0000 mg | ORAL_TABLET | Freq: Every day | ORAL | 2 refills | Status: DC

## 2017-09-09 ENCOUNTER — Institutional Professional Consult (permissible substitution): Payer: Medicare HMO | Admitting: Emergency Medicine

## 2017-09-20 NOTE — Progress Notes (Signed)
Subjective:   [name] was seen in consultation in the movement disorder clinic at the request of [name], Malka So, MD.  This patient is accompanied in the office by his spouse who supplements the history.  The evaluation is for tremor.  Tremor started approximately 6 months ago and involves the bilateral UE.  Tremor is most noticeable when he goes to do something with fine motor coordination.   There is no family hx of tremor.  Patient worried about the development of Parkinson's disease, as he had a friend who had Parkinson's disease.  Affected by caffeine:  No. (2 cups per day) Affected by alcohol:  Doesn't know (drinks 24 oz of wine per night - "helps me get to sleep") Affected by stress:  More anxious than in the past but doesn't affect tremor Affected by fatigue:  No. Spills soup if on spoon:  Yes.  , it can Spills glass of liquid if full:  Yes.  , sometimes Affects ADL's (tying shoes, brushing teeth, etc):  No.  Current/Previously tried tremor medications: xanax (takes 1 time per month for sleep); gabapentin: 900 mg tid for muscle cramping at night  Current medications that may exacerbate tremor:  Albuterol/ combivent (now off of these meds)  Outside reports reviewed: historical medical records, lab reports, office notes and referral letter/letters.  09/21/17 update: Patient is seen today in follow-up for tremor and cramping.  He is accompanied by his wife who supplements the history.  He was given a prescription for baclofen at last visit.  He reports that didn't help and he d/c it.  He tried tonic water without relief.  has been on quinine in the past.  "I have had nighttime cramping all of my life."  Reports some day cramping as well that has become bothersome.  Reports tremor is a little worse as well.  Notices it most when he does something requiring fine motor coordination, such as changing the battery in his hearing aid.  Records have been reviewed since our last visit.  Has  recently seen his primary care physician as his COPD has not been improved with Symbicort and albuterol.  Pulmonary consultation was recommended.  No Known Allergies  Outpatient Encounter Medications as of 09/21/2017  Medication Sig  . ALPRAZolam (XANAX) 0.25 MG tablet Take 0.25 mg by mouth at bedtime as needed for anxiety.  . budesonide-formoterol (SYMBICORT) 80-4.5 MCG/ACT inhaler Inhale 2 puffs into the lungs 2 (two) times daily.  . diclofenac sodium (VOLTAREN) 1 % GEL Apply 4 g topically 4 (four) times daily.  Marland Kitchen doxazosin (CARDURA) 2 MG tablet Take 1 tablet (2 mg total) by mouth 2 (two) times daily.  . finasteride (PROSCAR) 5 MG tablet Take 1 tablet (5 mg total) by mouth daily.  . furosemide (LASIX) 40 MG tablet Take 1 tablet (40 mg total) by mouth daily. You may take 1 extra lasix in the evening if you have SOB, LEE, and weight gain > 3lbs in 24 hours  . glipiZIDE (GLUCOTROL) 5 MG tablet TAKE 1 TABLET BY MOUTH TWICE A DAY BEFORE A MEAL  . Glucose Blood (FREESTYLE LITE TEST VI)   . metoprolol succinate (TOPROL-XL) 25 MG 24 hr tablet Take 1 tablet (25 mg total) by mouth daily.  . ONE TOUCH ULTRA TEST test strip USE TO CHECK BLOOD SUGAR DAILY  . ONETOUCH DELICA LANCETS 02H MISC USE TO CHECK BLOOD SUGAR DAILY AND PRN  . sertraline (ZOLOFT) 100 MG tablet Take 1 tablet (100 mg total)  by mouth daily.  . tamsulosin (FLOMAX) 0.4 MG CAPS capsule Take 1 capsule (0.4 mg total) by mouth daily after supper.  Marland Kitchen TIZANIDINE HCL PO Take 2 mg by mouth as needed.  . valsartan (DIOVAN) 80 MG tablet Take 1 tablet (80 mg total) by mouth daily.   Facility-Administered Encounter Medications as of 09/21/2017  Medication  . triamcinolone acetonide (KENALOG-40) injection 40 mg    Past Medical History:  Diagnosis Date  . BACK PAIN, CHRONIC 04/18/2009  . BENIGN PROSTATIC HYPERTROPHY, HX OF 01/19/2007  . Carpal tunnel syndrome 03/24/2007  . CORONARY ARTERY DISEASE 01/19/2007  . DEPRESSION, CHRONIC 06/24/2007  .  DIABETES MELLITUS 01/19/2007  . DIABETES MELLITUS, TYPE II, WITH NEUROLOGICAL COMPLICATIONS 10/19/2954  . Diarrhea 02/23/2007  . HEART MURMUR, SYSTOLIC 05/27/863  . HERPES SIMPLEX INFECTION 01/19/2007  . HYPERLIPIDEMIA 01/19/2007  . HYPERTENSION 01/19/2007  . Intermediate coronary syndrome ([place]) 08/29/2008  . LEG CRAMPS, NOCTURNAL 10/28/2007  . Leg cramps, sleep related 08/05/2010  . OBSTRUCTIVE SLEEP APNEA 01/19/2007   does not use CPAP  . OSTEOARTHRITIS 06/24/2007  . Other postprocedural status(V45.89) 01/19/2007  . PERCUTANEOUS TRANSLUMINAL CORONARY ANGIOPLASTY, HX OF 01/19/2007  . PLANTAR FASCIITIS, RIGHT 01/19/2007  . SHOULDER PAIN, LEFT 03/24/2007  . SPINAL STENOSIS, LUMBAR 06/03/2009  . TINEA PEDIS 11/15/2009  . URI 01/11/2009    Past Surgical History:  Procedure Laterality Date  . arthroscopy, knee of hx    . CARDIAC CATHETERIZATION    . CATARACT EXTRACTION Left   . COLONOSCOPY    . inguinal herniorrhapy, hx    . NASAL SINUS SURGERY    . percutaneous transluminal coronary angioplasty, hx of  2004   Dr. Lyndel Safe  . plastic joint in thumb    . PTCA/stent  2004  . TONSILLECTOMY    . VASECTOMY      Social History   Socioeconomic History  . Marital status: Married    Spouse name: Not on file  . Number of children: Not on file  . Years of education: Not on file  . Highest education level: Not on file  Occupational History  . Occupation: retired    Comment: Truck Research officer, political party  Social Needs  . Financial resource strain: Not on file  . Food insecurity:    Worry: Not on file    Inability: Not on file  . Transportation needs:    Medical: Not on file    Non-medical: Not on file  Tobacco Use  . Smoking status: Former Smoker    Last attempt to quit: 04/14/1983    Years since quitting: 34.4  . Smokeless tobacco: Never Used  . Tobacco comment: stopped 1985  Substance and Sexual Activity  . Alcohol use: Yes    Comment: 24 oz daily wine  . Drug use: Yes    Types: Marijuana  .  Sexual activity: Not on file  Lifestyle  . Physical activity:    Days per week: Not on file    Minutes per session: Not on file  . Stress: Not on file  Relationships  . Social connections:    Talks on phone: Not on file    Gets together: Not on file    Attends religious service: Not on file    Active member of club or organization: Not on file    Attends meetings of clubs or organizations: Not on file    Relationship status: Not on file  . Intimate partner violence:    Fear of current or ex partner: Not  on file    Emotionally abused: Not on file    Physically abused: Not on file    Forced sexual activity: Not on file  Other Topics Concern  . Not on file  Social History Narrative   Married in 1959 - 88yrs, divorced; married 1974 - 1.5 years, divorced; married 1985 1 daughter 86; 61 step daughters; 2 grandchildren.    Family Status  Relation Name Status  . Mother  Deceased at age 82       complications of rheum disease  . Father  Deceased at age 38  . Sister  Alive       rheumatism  . Daughter  Alive       '72  . Other family (Not Specified)    Review of Systems Review of Systems  Constitutional: Negative.   HENT: Negative.   Respiratory: Positive for shortness of breath.   Gastrointestinal: Negative.   Genitourinary: Negative.   Musculoskeletal: Negative.   Skin: Negative.   Neurological: Positive for tremors.  Psychiatric/Behavioral: Negative.       Objective:   VITALS:   Vitals:   09/21/17 1121  BP: 104/74  Pulse: 60  SpO2: 95%  Weight: 175 lb 8 oz (79.6 kg)  Height: 5\' 5"  (1.651 m)   GEN:  The patient appears stated age and is in NAD. HEENT:  Normocephalic, atraumatic.  The mucous membranes are moist. The superficial temporal arteries are without ropiness or tenderness. CV:  RRR Lungs:  CTAB Neck/HEME:  There are no carotid bruits bilaterally.  Neurological examination:  Orientation: The patient is alert and oriented x3. Cranial nerves:  There is good facial symmetry. The speech is fluent and clear. Soft palate rises symmetrically and there is no tongue deviation. Hearing is intact to conversational tone. Sensation: Sensation is intact to light touch throughout Motor: Strength is at least antigravity x4.  Movement examination: Tone: There is normal tone in the RUE.  There is normal tone in the LUE.  There is normal tone in the RLE.  There is normal tone in the LLE.  Abnormal movements: There is mild tremor of the outstretched hands. Coordination:  There is no decremation with RAM's, with any form of RAMS, including alternating supination and pronation of the forearm, hand opening and closing, finger taps, heel taps and toe taps. Gait and Station: The patient pushes off of the chair to arise.  He is slightly unstable even with a cane.   Labs:  Lab Results  Component Value Date   TSH 1.68 04/01/2017      Chemistry      Component Value Date/Time   NA 141 04/23/2017 0828   K 4.0 04/23/2017 0828   CL 104 04/23/2017 0828   CO2 28 04/23/2017 0828   BUN 16 04/23/2017 0828   CREATININE 0.67 04/23/2017 0828   CREATININE 0.81 01/15/2017 1555      Component Value Date/Time   CALCIUM 9.2 04/23/2017 0828   ALKPHOS 71 04/23/2017 0828   AST 24 04/23/2017 0828   ALT 25 04/23/2017 0828   BILITOT 0.8 04/23/2017 0828     Lab Results  Component Value Date   VITAMINB12 370 04/01/2017   Lab Results  Component Value Date   HGBA1C 6.5 07/21/2017     Assessment/Plan:   1.  Tremor  -He asked me about primidone today, but ultimately decided that he did not want to take it.  I agree with this, as tremor is actually quite mild.  -Discussed complex relationship  of tremor to alcohol.  Discussed the importance of slowly tapering and ultimately discontinuing alcohol.  2.   Peripheral neuropathy, likely due to diabetes  -Likely the etiology of gait/balance issues.  3.  LE cramping  -wonder if related to alcohol/dehydration at  night.  He does not think so.  He has had lower extremity cramping for a long time.  It was better when he was using opioids, and came back when his opioids were discontinued because of concern about long-term opioid use.  -he tried zanaflex and baclofen without relief  -discussed that old AED's like dilantin or tegretol may help but there are risks to these medications.  Wife wants him to try.  Would start with dilantin 100 mg bid.  Risks, benefits, side effects and alternative therapies were discussed.  The opportunity to ask questions was given and they were answered to the best of my ability.  The patient expressed understanding and willingness to follow the outlined treatment protocols.  We will call him in a month and see how he is doing.  5.  b12 deficiency  -would recommend a supplement.  He forgot that I had recommended this previously.  Recommend B12, 1000 mcg daily.  6.  Follow-up will depend on whether or not he decides to stay on medication.  Will assess that when we call him in a month or so.  CC:  [name], [name], MD

## 2017-09-21 ENCOUNTER — Ambulatory Visit (INDEPENDENT_AMBULATORY_CARE_PROVIDER_SITE_OTHER): Payer: Medicare HMO | Admitting: Neurology

## 2017-09-21 ENCOUNTER — Encounter: Payer: Self-pay | Admitting: Neurology

## 2017-09-21 VITALS — BP 104/74 | HR 60 | Ht 65.0 in | Wt 175.5 lb

## 2017-09-21 DIAGNOSIS — R252 Cramp and spasm: Secondary | ICD-10-CM

## 2017-09-21 DIAGNOSIS — E1142 Type 2 diabetes mellitus with diabetic polyneuropathy: Secondary | ICD-10-CM

## 2017-09-21 DIAGNOSIS — E1149 Type 2 diabetes mellitus with other diabetic neurological complication: Secondary | ICD-10-CM

## 2017-09-21 DIAGNOSIS — E538 Deficiency of other specified B group vitamins: Secondary | ICD-10-CM | POA: Diagnosis not present

## 2017-09-21 MED ORDER — PHENYTOIN SODIUM EXTENDED 100 MG PO CAPS: 100.0000 mg | ORAL_CAPSULE | Freq: Two times a day (BID) | ORAL | 2 refills | Status: DC

## 2017-09-21 NOTE — Patient Instructions (Addendum)
1.  We discussed primidone.  You decided not to take it.  If you decide to change your mind, you can let me know.    2.  Take over the counter b12 - 1020mcg daily  3.  You can try dilantin - 100mg  twice per day for cramping.  You will need to try that for a few weeks to see if helpful.  My MA will call you in a month to see how you are doing.

## 2017-09-30 ENCOUNTER — Telehealth: Payer: Self-pay | Admitting: *Deleted

## 2017-09-30 NOTE — Telephone Encounter (Addendum)
PA for diclofenac 1% gel initiated via CoverMyMeds. Awaiting determination. Key: [name]

## 2017-10-13 ENCOUNTER — Encounter: Payer: Self-pay | Admitting: Family Medicine

## 2017-10-13 NOTE — Telephone Encounter (Signed)
PA approved through 04/12/18 

## 2017-10-19 ENCOUNTER — Ambulatory Visit: Payer: Medicare HMO | Admitting: Pulmonary Disease

## 2017-10-19 ENCOUNTER — Encounter: Payer: Self-pay | Admitting: Pulmonary Disease

## 2017-10-19 ENCOUNTER — Ambulatory Visit (INDEPENDENT_AMBULATORY_CARE_PROVIDER_SITE_OTHER)
Admission: RE | Admit: 2017-10-19 | Discharge: 2017-10-19 | Disposition: A | Discharge status: Home / Self Care | Payer: Medicare HMO | Source: Ambulatory Visit | Attending: Pulmonary Disease | Admitting: Pulmonary Disease

## 2017-10-19 VITALS — BP 124/78 | HR 89 | Ht 65.0 in | Wt 180.4 lb

## 2017-10-19 DIAGNOSIS — R0602 Shortness of breath: Secondary | ICD-10-CM

## 2017-10-19 DIAGNOSIS — J929 Pleural plaque without asbestos: Secondary | ICD-10-CM | POA: Diagnosis not present
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
## 2017-10-19 NOTE — Patient Instructions (Signed)
We will schedule you for a chest x-ray and pulmonary function test for evaluation of the lung Follow-up in clinic in 1 to 2 months to reevaluate.

## 2017-10-21 ENCOUNTER — Telehealth: Payer: Self-pay | Admitting: Neurology

## 2017-10-21 NOTE — Telephone Encounter (Signed)
-----   Message from Annamaria Helling, [place] sent at 09/23/2017  8:06 AM EDT ----- Tat, Eustace Quail, DO sent to Annamaria Helling, [place]    Call pt in a month and see if dilantin helped cramping

## 2017-10-21 NOTE — Telephone Encounter (Signed)
Mychart message sent to patient.

## 2017-11-14 ENCOUNTER — Encounter: Payer: Self-pay | Admitting: Family Medicine

## 2017-11-15 ENCOUNTER — Other Ambulatory Visit: Payer: Self-pay | Admitting: *Deleted

## 2017-11-19 ENCOUNTER — Other Ambulatory Visit: Payer: Self-pay | Admitting: Family Medicine

## 2017-11-19 ENCOUNTER — Encounter: Payer: Self-pay | Admitting: Family Medicine

## 2017-11-19 MED ORDER — TIZANIDINE HCL 2 MG PO TABS: 2.0000 mg | ORAL_TABLET | ORAL | 0 refills | Status: DC | PRN

## 2017-11-22 ENCOUNTER — Encounter: Payer: Self-pay | Admitting: Family Medicine

## 2017-12-17 ENCOUNTER — Ambulatory Visit (INDEPENDENT_AMBULATORY_CARE_PROVIDER_SITE_OTHER): Payer: Medicare HMO | Admitting: Family Medicine

## 2017-12-17 ENCOUNTER — Encounter: Payer: Self-pay | Admitting: Family Medicine

## 2017-12-17 VITALS — BP 160/75 | HR 72 | Temp 97.9°F | Resp 16 | Ht 65.0 in | Wt 177.1 lb

## 2017-12-17 DIAGNOSIS — I119 Hypertensive heart disease without heart failure: Secondary | ICD-10-CM | POA: Diagnosis not present

## 2017-12-17 DIAGNOSIS — R252 Cramp and spasm: Secondary | ICD-10-CM

## 2017-12-17 DIAGNOSIS — N401 Enlarged prostate with lower urinary tract symptoms: Secondary | ICD-10-CM | POA: Diagnosis not present

## 2017-12-17 DIAGNOSIS — R5382 Chronic fatigue, unspecified: Secondary | ICD-10-CM

## 2017-12-17 DIAGNOSIS — R6889 Other general symptoms and signs: Secondary | ICD-10-CM

## 2017-12-17 DIAGNOSIS — R351 Nocturia: Secondary | ICD-10-CM

## 2017-12-17 LAB — VITAMIN B12: VITAMIN B 12: 770 pg/mL (ref 211–911)

## 2017-12-17 MED ORDER — TIZANIDINE HCL 4 MG PO CAPS: 4.0000 mg | ORAL_CAPSULE | Freq: Every evening | ORAL | 1 refills | Status: DC | PRN

## 2017-12-17 MED ORDER — VALSARTAN 40 MG PO TABS: 20.0000 mg | ORAL_TABLET | Freq: Every day | ORAL | 1 refills | Status: DC

## 2017-12-17 MED ORDER — DOXAZOSIN MESYLATE 4 MG PO TABS: 4.0000 mg | ORAL_TABLET | Freq: Every day | ORAL | 1 refills | Status: DC

## 2017-12-17 NOTE — Assessment & Plan Note (Signed)
BP recheck today and elevated at 160/75. He agrees with resuming Diovan but a lower dose, half tablet of Diovan 40 mg daily. Continue monitoring BP at home, recommend bringing BP monitor next visit. Continue low-salt diet. Follow-up in 01/2018.

## 2017-12-17 NOTE — Progress Notes (Signed)
HPI:   [name] is a 84 y.o. male, who is here today for  follow up on some concerned, not new complaints.   He was last seen on 08/25/2017, when he was complaining about exertional dyspnea. He has history of chronic fatigue, generalized OA, COPD, OSA, and intermittent exertional dyspnea. He states that somebody told him to stop all his medication because they could be causing all his symptoms. He discontinued metoprolol, valsartan, sertraline, and Flexeril. He is not taking furosemide 40 mg daily but rather daily as needed.  He has checking BP at home, 150s/90s, numbers are not different to those he was having when he was taking his medications. He states that after discontinuing medications he feels much better, diarrhea and the knee pain improved as well as dyspnea and fatigue.  He is also asking if he really needs to have a PFTs done. He was evaluated by pulmonologist recently due to exertional dyspnea and COPD. He has an appointment to have a PFTs as well as 6 minutes walking, he wonders about the purpose of this test. Exertional dyspnea is not getting worse and seems to be better after stopping some of his medications. He has history of diastolic dysfunction grade 2, last echo in 08/2017: LVEF 60 to 65%.  Mild aortic and mitral regurgitation, and mild pulmonary hypertension. He has history of OSA, he has not tolerated CPAP mask, which she tried many years ago but not interested in trying again.  He is also concerned about memory problems, his wife concurs. For a while now he has had problems with word finding, which he thinks is getting worse. According to his wife, he repeats himself.  He follows with neurologist, next appointment in a few weeks.   BPH with nocturia: He is currently on Flomax 0.4 mg daily and doxazosin 2 mg twice daily. He states that initially when he started doxazosin he noticed some improvement in urinary frequency, he was able to sleep through  the night but he is having nocturia again, 2-3. He denies dysuria, gross hematuria, abdominal pain, or increased urinary frequency during the day.  He has followed with urologist in the past.   Pruritic rash: He has had problem for 1 to 2 years. He has seen dermatologist twice. Topical steroid has been recommended, last one was betamethasone.  Topical steroid helps but he has not been consistent with medication, he wants to know if he can continue with twice daily daily. Rash is on lower extremities, around ankles, R>L He has not identified exacerbating or alleviating factors. Problem is overall stable. Pruritus interferes with sleep.   Review of Systems  Constitutional: Positive for fatigue. Negative for activity change, appetite change and fever.  HENT: Negative for nosebleeds and sore throat.   Eyes: Negative for redness and visual disturbance.  Respiratory: Positive for shortness of breath. Negative for cough and wheezing.   Cardiovascular: Positive for leg swelling. Negative for chest pain and palpitations.  Gastrointestinal: Negative for abdominal pain, diarrhea, nausea and vomiting.  Genitourinary: Positive for frequency. Negative for decreased urine volume, dysuria, hematuria and testicular pain.  Musculoskeletal: Positive for arthralgias and gait problem.  Skin: Positive for rash. Negative for wound.  Neurological: Negative for syncope, weakness and headaches.  Psychiatric/Behavioral: Positive for sleep disturbance. The patient is nervous/anxious.      Current Outpatient Medications on File Prior to Visit  Medication Sig Dispense Refill  . Albuterol Sulfate (PROAIR HFA IN) Inhale into the lungs as needed.     Marland Kitchen  budesonide-formoterol (SYMBICORT) 80-4.5 MCG/ACT inhaler Inhale 2 puffs into the lungs 2 (two) times daily.    . COLLAGEN PO Take by mouth daily.    . Cyanocobalamin (B-12) 1000 MCG TABS Take 1,000 mcg by mouth daily.    . diclofenac sodium (VOLTAREN) 1 % GEL  Apply 4 g topically 4 (four) times daily. 4 Tube 3  . finasteride (PROSCAR) 5 MG tablet Take 1 tablet (5 mg total) by mouth daily. 90 tablet 2  . furosemide (LASIX) 40 MG tablet Take 1 tablet (40 mg total) by mouth daily. You may take 1 extra lasix in the evening if you have SOB, LEE, and weight gain > 3lbs in 24 hours (Patient taking differently: Take 40 mg by mouth as needed. You may take 1 extra lasix in the evening if you have SOB, LEE, and weight gain > 3lbs in 24 hours) 100 tablet 2  . glipiZIDE (GLUCOTROL) 5 MG tablet TAKE 1 TABLET BY MOUTH TWICE A DAY BEFORE A MEAL 180 tablet 2  . Glucose Blood (FREESTYLE LITE TEST VI)     . ONE TOUCH ULTRA TEST test strip USE TO CHECK BLOOD SUGAR DAILY 100 each 4  . ONETOUCH DELICA LANCETS 86V MISC USE TO CHECK BLOOD SUGAR DAILY AND PRN 100 each 4  . phenytoin (DILANTIN) 100 MG ER capsule Take 1 capsule (100 mg total) by mouth 2 (two) times daily. 60 capsule 2  . tamsulosin (FLOMAX) 0.4 MG CAPS capsule Take 1 capsule (0.4 mg total) by mouth daily after supper. 90 capsule 2   Current Facility-Administered Medications on File Prior to Visit  Medication Dose Route Frequency Provider Last Rate Last Dose  . triamcinolone acetonide (KENALOG-40) injection 40 mg  40 mg Intramuscular Once [name], [name], MD         Past Medical History:  Diagnosis Date  . BACK PAIN, CHRONIC 04/18/2009  . BENIGN PROSTATIC HYPERTROPHY, HX OF 01/19/2007  . Carpal tunnel syndrome 03/24/2007  . CORONARY ARTERY DISEASE 01/19/2007  . DEPRESSION, CHRONIC 06/24/2007  . DIABETES MELLITUS 01/19/2007  . DIABETES MELLITUS, TYPE II, WITH NEUROLOGICAL COMPLICATIONS 10/19/4694  . Diarrhea 02/23/2007  . HEART MURMUR, SYSTOLIC 2/95/2841  . HERPES SIMPLEX INFECTION 01/19/2007  . HYPERLIPIDEMIA 01/19/2007  . HYPERTENSION 01/19/2007  . Intermediate coronary syndrome ([place]) 08/29/2008  . LEG CRAMPS, NOCTURNAL 10/28/2007  . Leg cramps, sleep related 08/05/2010  . OBSTRUCTIVE SLEEP APNEA 01/19/2007    does not use CPAP  . OSTEOARTHRITIS 06/24/2007  . Other postprocedural status(V45.89) 01/19/2007  . PERCUTANEOUS TRANSLUMINAL CORONARY ANGIOPLASTY, HX OF 01/19/2007  . PLANTAR FASCIITIS, RIGHT 01/19/2007  . SHOULDER PAIN, LEFT 03/24/2007  . SPINAL STENOSIS, LUMBAR 06/03/2009  . TINEA PEDIS 11/15/2009  . URI 01/11/2009   No Known Allergies  Social History   Socioeconomic History  . Marital status: Married    Spouse name: Not on file  . Number of children: Not on file  . Years of education: Not on file  . Highest education level: Not on file  Occupational History  . Occupation: retired    Comment: Truck Research officer, political party  Social Needs  . Financial resource strain: Not on file  . Food insecurity:    Worry: Not on file    Inability: Not on file  . Transportation needs:    Medical: Not on file    Non-medical: Not on file  Tobacco Use  . Smoking status: Former Smoker    Last attempt to quit: 04/14/1983    Years since quitting: 34.7  .  Smokeless tobacco: Never Used  . Tobacco comment: stopped 1985  Substance and Sexual Activity  . Alcohol use: Yes    Comment: 24 oz daily wine  . Drug use: Not Currently    Types: Marijuana    Comment: occ  . Sexual activity: Not on file  Lifestyle  . Physical activity:    Days per week: Not on file    Minutes per session: Not on file  . Stress: Not on file  Relationships  . Social connections:    Talks on phone: Not on file    Gets together: Not on file    Attends religious service: Not on file    Active member of club or organization: Not on file    Attends meetings of clubs or organizations: Not on file    Relationship status: Not on file  Other Topics Concern  . Not on file  Social History Narrative   Married in 1959 - 24yrs, divorced; married 1974 - 1.5 years, divorced; married 1985 1 daughter 73; 101 step daughters; 2 grandchildren.    Vitals:   12/17/17 1420 12/17/17 1527  BP: 132/84 (!) 160/75  Pulse: 72   Resp: 16   Temp: 97.9  F (36.6 C)   SpO2: 95%    Body mass index is 29.48 kg/m.    Physical Exam  Nursing note reviewed. Constitutional: He is oriented to person, place, and time. He appears well-developed. No distress.  HENT:  Head: Normocephalic and atraumatic.  Mouth/Throat: Oropharynx is clear and moist and mucous membranes are normal.  Eyes: Pupils are equal, round, and reactive to light. Conjunctivae are normal.  Cardiovascular: Normal rate and regular rhythm.  Murmur (SEM II-III RUSB and LUSB) heard. Pulses:      Dorsalis pedis pulses are 2+ on the right side, and 2+ on the left side.  Respiratory: Effort normal and breath sounds normal. No respiratory distress.  GI: Soft. He exhibits no mass. There is no hepatomegaly. There is no tenderness.  Musculoskeletal: He exhibits edema (1+ pitting LE edema,bilateral.).  Lymphadenopathy:    He has no cervical adenopathy.  Neurological: He is alert and oriented to person, place, and time. He has normal strength. No cranial nerve deficit. Gait normal.  Skin: Skin is warm. No rash noted. No erythema.  Psychiatric: He has a normal mood and affect. Cognition and memory are normal.  Well groomed, good eye contact.       ASSESSMENT AND PLAN:   [name] was seen today for follow-up.  Orders Placed This Encounter  Procedures  . Vitamin B12   Lab Results  Component Value Date   [id number] 202 12/17/2017    Hypertension with heart disease BP recheck today and elevated at 160/75. He agrees with resuming Diovan but a lower dose, half tablet of Diovan 40 mg daily. Continue monitoring BP at home, recommend bringing BP monitor next visit. Continue low-salt diet. Follow-up in 01/2018.  BPH associated with nocturia We discussed symptoms, prognosis, and treatment options for BPH. He has followed with urologist and he is not interested in going back. He feels like doxazosin just at the time was better than twice daily, so he will take doxazosin  4 mg at bedtime. No changes on Flomax or Proscar. Some side effects of medication discussed.   Chronic fatigue It seems to be better after he discontinued some of his medications. We discussed other possible etiologies.   Cramp of both lower extremities Because Zanaflex 4 mg at bedtime  seems to help better than 2 mg, we will change Zanaflex strength tablet from 2 mg to 4 mg to continue 1 tablet at bedtime. We discussed some side effects. Fall precautions discussed.  Forgetfulness Chronic problem and getting worse. Today he is oriented x3. We discussed possible etiologies, including depression, MCI, and some vitamin deficiencies. Recommend cognitive challenging exercises.   Recommend addressing this problem with his neurologist.       [name]. [name], MD  [facility]. [place] office.

## 2017-12-17 NOTE — Assessment & Plan Note (Addendum)
Chronic problem and getting worse. Today he is oriented x3. We discussed possible etiologies, including depression, MCI, and some vitamin deficiencies. Recommend cognitive challenging exercises.   Recommend addressing this problem with his neurologist.

## 2017-12-17 NOTE — Assessment & Plan Note (Signed)
It seems to be better after he discontinued some of his medications. We discussed other possible etiologies.

## 2017-12-17 NOTE — Assessment & Plan Note (Signed)
We discussed symptoms, prognosis, and treatment options for BPH. He has followed with urologist and he is not interested in going back. He feels like doxazosin just at the time was better than twice daily, so he will take doxazosin 4 mg at bedtime. No changes on Flomax or Proscar. Some side effects of medication discussed.

## 2017-12-17 NOTE — Assessment & Plan Note (Signed)
Because Zanaflex 4 mg at bedtime seems to help better than 2 mg, we will change Zanaflex strength tablet from 2 mg to 4 mg to continue 1 tablet at bedtime. We discussed some side effects. Fall precautions discussed.

## 2017-12-17 NOTE — Patient Instructions (Addendum)
A few things to remember from today's visit:   Forgetfulness - Plan: Vitamin B12  Hypertension with heart disease  Resume Valsartan 40 mg 1/2 tab daily Continue monitoring blood pressure.  Please be sure medication list is accurate. If a new problem present, please set up appointment sooner than planned today.

## 2017-12-19 ENCOUNTER — Encounter: Payer: Self-pay | Admitting: Family Medicine

## 2017-12-21 ENCOUNTER — Ambulatory Visit (INDEPENDENT_AMBULATORY_CARE_PROVIDER_SITE_OTHER): Payer: Medicare HMO | Admitting: Pulmonary Disease

## 2017-12-21 ENCOUNTER — Encounter: Payer: Self-pay | Admitting: Pulmonary Disease

## 2017-12-21 ENCOUNTER — Ambulatory Visit: Payer: Medicare HMO | Admitting: Pulmonary Disease

## 2017-12-21 VITALS — BP 140/72 | HR 74 | Ht 63.0 in | Wt 178.0 lb

## 2017-12-21 DIAGNOSIS — R0602 Shortness of breath: Secondary | ICD-10-CM

## 2017-12-21 DIAGNOSIS — J449 Chronic obstructive pulmonary disease, unspecified: Secondary | ICD-10-CM

## 2017-12-21 DIAGNOSIS — Z23 Encounter for immunization: Secondary | ICD-10-CM

## 2017-12-21 LAB — PULMONARY FUNCTION TEST
DL/VA % pred: 115 %
DL/VA: 4.66 ml/min/mmHg/L
DLCO unc % pred: 82 %
DLCO unc: 18.96 ml/min/mmHg
FEF 25-75 Post: 1.11 L/s
FEF 25-75 Pre: 0.71 L/s
FEF2575-%Change-Post: 54 %
FEF2575-%Pred-Post: 95 %
FEF2575-%Pred-Pre: 61 %
FEV1-%Change-Post: 11 %
FEV1-%Pred-Post: 80 %
FEV1-%Pred-Pre: 72 %
FEV1-Post: 1.5 L
FEV1-Pre: 1.34 L
FEV1FVC-%Change-Post: 7 %
FEV1FVC-%Pred-Pre: 95 %
FEV6-%Change-Post: 4 %
FEV6-%Pred-Post: 82 %
FEV6-%Pred-Pre: 79 %
FEV6-Post: 2.05 L
FEV6-Pre: 1.97 L
FEV6FVC-%Change-Post: 0 %
FEV6FVC-%Pred-Post: 109 %
FEV6FVC-%Pred-Pre: 109 %
FVC-%Change-Post: 3 %
FVC-%Pred-Post: 74 %
FVC-%Pred-Pre: 72 %
FVC-Post: 2.05 L
FVC-Pre: 1.98 L
Post FEV1/FVC ratio: 73 %
Post FEV6/FVC ratio: 100 %
Pre FEV1/FVC ratio: 68 %
Pre FEV6/FVC Ratio: 99 %
RV % pred: 96 %
RV: 2.26 L
TLC % pred: 77 %
TLC: 4.38 L

## 2017-12-21 NOTE — Patient Instructions (Signed)
Continue albuterol as needed Your lung function tests show mild COPD Work on weight loss with diet and exercise Follow-up as needed.

## 2017-12-21 NOTE — Progress Notes (Signed)
Patient completed full PFT today. 
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
## 2017-12-27 DIAGNOSIS — R69 Illness, unspecified: Secondary | ICD-10-CM | POA: Diagnosis not present

## 2017-12-30 DIAGNOSIS — H401131 Primary open-angle glaucoma, bilateral, mild stage: Secondary | ICD-10-CM | POA: Diagnosis not present

## 2018-01-17 ENCOUNTER — Other Ambulatory Visit: Payer: Self-pay | Admitting: Neurology

## 2018-01-26 ENCOUNTER — Encounter: Payer: Self-pay | Admitting: Family Medicine

## 2018-01-28 ENCOUNTER — Other Ambulatory Visit: Payer: Self-pay | Admitting: *Deleted

## 2018-01-28 MED ORDER — TAMSULOSIN HCL 0.4 MG PO CAPS: 0.4000 mg | ORAL_CAPSULE | Freq: Every day | ORAL | 2 refills | Status: DC

## 2018-02-08 ENCOUNTER — Encounter: Payer: Self-pay | Admitting: Family Medicine

## 2018-02-08 ENCOUNTER — Ambulatory Visit (INDEPENDENT_AMBULATORY_CARE_PROVIDER_SITE_OTHER): Payer: Medicare HMO | Admitting: Family Medicine

## 2018-02-08 VITALS — BP 150/90 | HR 58 | Temp 97.6°F | Resp 16 | Wt 177.6 lb

## 2018-02-08 DIAGNOSIS — K529 Noninfective gastroenteritis and colitis, unspecified: Secondary | ICD-10-CM | POA: Diagnosis not present

## 2018-02-08 DIAGNOSIS — E1149 Type 2 diabetes mellitus with other diabetic neurological complication: Secondary | ICD-10-CM | POA: Diagnosis not present

## 2018-02-08 DIAGNOSIS — G629 Polyneuropathy, unspecified: Secondary | ICD-10-CM | POA: Diagnosis not present

## 2018-02-08 DIAGNOSIS — I119 Hypertensive heart disease without heart failure: Secondary | ICD-10-CM

## 2018-02-08 LAB — POCT GLYCOSYLATED HEMOGLOBIN (HGB A1C): HEMOGLOBIN A1C: 6.3 % — AB (ref 4.0–5.6)

## 2018-02-08 MED ORDER — VALSARTAN 40 MG PO TABS: 40.0000 mg | ORAL_TABLET | Freq: Every day | ORAL | 1 refills | Status: DC

## 2018-02-08 NOTE — Patient Instructions (Signed)
A few things to remember from today's visit:   Diabetes mellitus type 2 with neurological manifestations ([place]) - Plan: POCT glycosylated hemoglobin (Hb X7F), Basic metabolic panel, Fructosamine   Please be sure medication list is accurate. If a new problem present, please set up appointment sooner than planned today.

## 2018-02-08 NOTE — Progress Notes (Signed)
HPI:   [name] is a 84 y.o. male, who is here today for 6 months follow up.   He was last seen on 12/17/17 for acute visit.  DM II: He is on Glipizide 5 mg bid. Denies abdominal pain, nausea,vomiting, polydipsia,polyuria, or polyphagia. He has not noted hypoglycemic events.  BS's 130-140's.  Last HgA1C was 6.5 on 07/21/17.  Lab Results  Component Value Date   MICROALBUR 0.7 07/03/2016   Denies abdominal pain, nausea,vomiting, polydipsia,polyuria, or polyphagia. Peripheral neuropathy, he has not noted skin ulcers. Bilateral foot numbness. He was on Gabapentin.  HTN: BP elevated today. Home BP readings 130's-150's/80's.  He is on non pharmacologic treatment. He discontinued all medications, were aggravating fatigue.   Lab Results  Component Value Date   CREATININE 0.67 04/23/2017   BUN 16 04/23/2017   NA 141 04/23/2017   K 4.0 04/23/2017   CL 104 04/23/2017   CO2 28 04/23/2017   Denies severe/frequent headache, visual changes, chest pain, worsening dyspnea, palpitation, claudication, focal weakness, or worsening edema.  Chronic diarrhea/IBS: Currently on OTC Imodium, which usually helps when he takes it daily 2 per day. Occasionally he has loose stools with episodes of incontinence. He has not identified exacerbating or alleviating factors.  Denies abdominal pain, nausea, vomiting, changes in bowel habits, blood in stool or melena.   Review of Systems  Constitutional: Positive for fatigue. Negative for activity change, appetite change and fever.  HENT: Negative for nosebleeds, sore throat and trouble swallowing.   Eyes: Negative for redness and visual disturbance.  Respiratory: Negative for cough, shortness of breath and wheezing.   Cardiovascular: Negative for chest pain, palpitations and leg swelling.  Gastrointestinal: Positive for diarrhea. Negative for abdominal pain, nausea and vomiting.  Genitourinary: Negative for decreased urine volume,  dysuria and hematuria.  Musculoskeletal: Positive for arthralgias, back pain and gait problem.  Skin: Negative for rash and wound.  Neurological: Positive for numbness. Negative for syncope, weakness and headaches.  Psychiatric/Behavioral: Positive for sleep disturbance. The patient is nervous/anxious.     Current Outpatient Medications on File Prior to Visit  Medication Sig Dispense Refill  . Albuterol Sulfate (PROAIR HFA IN) Inhale into the lungs as needed.     . budesonide-formoterol (SYMBICORT) 80-4.5 MCG/ACT inhaler Inhale 2 puffs into the lungs 2 (two) times daily.    . COLLAGEN PO Take by mouth daily.    . Cyanocobalamin (B-12) 1000 MCG TABS Take 1,000 mcg by mouth daily.    . diclofenac sodium (VOLTAREN) 1 % GEL Apply 4 g topically 4 (four) times daily. 4 Tube 3  . doxazosin (CARDURA) 4 MG tablet Take 1 tablet (4 mg total) by mouth daily. 90 tablet 1  . finasteride (PROSCAR) 5 MG tablet Take 1 tablet (5 mg total) by mouth daily. 90 tablet 2  . furosemide (LASIX) 40 MG tablet Take 1 tablet (40 mg total) by mouth daily. You may take 1 extra lasix in the evening if you have SOB, LEE, and weight gain > 3lbs in 24 hours (Patient taking differently: Take 40 mg by mouth as needed. You may take 1 extra lasix in the evening if you have SOB, LEE, and weight gain > 3lbs in 24 hours) 100 tablet 2  . glipiZIDE (GLUCOTROL) 5 MG tablet TAKE 1 TABLET BY MOUTH TWICE A DAY BEFORE A MEAL 180 tablet 2  . Glucose Blood (FREESTYLE LITE TEST VI)     . ONE TOUCH ULTRA TEST test strip USE  TO CHECK BLOOD SUGAR DAILY 100 each 4  . ONETOUCH DELICA LANCETS 37C MISC USE TO CHECK BLOOD SUGAR DAILY AND PRN 100 each 4  . phenytoin (DILANTIN) 100 MG ER capsule TAKE ONE CAPSULE BY MOUTH TWICE DAILY  60 capsule 1  . tamsulosin (FLOMAX) 0.4 MG CAPS capsule Take 1 capsule (0.4 mg total) by mouth daily after supper. 90 capsule 2  . tiZANidine (ZANAFLEX) 4 MG capsule Take 1 capsule (4 mg total) by mouth at bedtime as needed  for muscle spasms. 90 capsule 1   Current Facility-Administered Medications on File Prior to Visit  Medication Dose Route Frequency Provider Last Rate Last Dose  . triamcinolone acetonide (KENALOG-40) injection 40 mg  40 mg Intramuscular Once [name], Florette Thai G, MD         Past Medical History:  Diagnosis Date  . BACK PAIN, CHRONIC 04/18/2009  . BENIGN PROSTATIC HYPERTROPHY, HX OF 01/19/2007  . Carpal tunnel syndrome 03/24/2007  . CORONARY ARTERY DISEASE 01/19/2007  . DEPRESSION, CHRONIC 06/24/2007  . DIABETES MELLITUS 01/19/2007  . DIABETES MELLITUS, TYPE II, WITH NEUROLOGICAL COMPLICATIONS 08/18/8500  . Diarrhea 02/23/2007  . HEART MURMUR, SYSTOLIC 7/74/1287  . HERPES SIMPLEX INFECTION 01/19/2007  . HYPERLIPIDEMIA 01/19/2007  . HYPERTENSION 01/19/2007  . Intermediate coronary syndrome ([place]) 08/29/2008  . LEG CRAMPS, NOCTURNAL 10/28/2007  . Leg cramps, sleep related 08/05/2010  . OBSTRUCTIVE SLEEP APNEA 01/19/2007   does not use CPAP  . OSTEOARTHRITIS 06/24/2007  . Other postprocedural status(V45.89) 01/19/2007  . PERCUTANEOUS TRANSLUMINAL CORONARY ANGIOPLASTY, HX OF 01/19/2007  . PLANTAR FASCIITIS, RIGHT 01/19/2007  . SHOULDER PAIN, LEFT 03/24/2007  . SPINAL STENOSIS, LUMBAR 06/03/2009  . TINEA PEDIS 11/15/2009  . URI 01/11/2009   No Known Allergies  Social History   Socioeconomic History  . Marital status: Married    Spouse name: Not on file  . Number of children: Not on file  . Years of education: Not on file  . Highest education level: Not on file  Occupational History  . Occupation: retired    Comment: Truck Research officer, political party  Social Needs  . Financial resource strain: Not on file  . Food insecurity:    Worry: Not on file    Inability: Not on file  . Transportation needs:    Medical: Not on file    Non-medical: Not on file  Tobacco Use  . Smoking status: Former Smoker    Last attempt to quit: 04/14/1983    Years since quitting: 34.8  . Smokeless tobacco: Never Used  . Tobacco  comment: stopped 1985  Substance and Sexual Activity  . Alcohol use: Yes    Comment: 24 oz daily wine  . Drug use: Not Currently    Types: Marijuana    Comment: occ  . Sexual activity: Not on file  Lifestyle  . Physical activity:    Days per week: Not on file    Minutes per session: Not on file  . Stress: Not on file  Relationships  . Social connections:    Talks on phone: Not on file    Gets together: Not on file    Attends religious service: Not on file    Active member of club or organization: Not on file    Attends meetings of clubs or organizations: Not on file    Relationship status: Not on file  Other Topics Concern  . Not on file  Social History Narrative   Married in 1959 - 35yrs, divorced; married 1974 - 1.5 years,  divorced; married 1985 1 daughter 52; 72 step daughters; 2 grandchildren.    Vitals:   02/08/18 1459  BP: (!) 150/90  Pulse: (!) 58  Resp: 16  Temp: 97.6 F (36.4 C)  SpO2: 95%   Body mass index is 31.46 kg/m.   Physical Exam  Nursing note and vitals reviewed. Constitutional: He is oriented to person, place, and time. He appears well-developed. No distress.  HENT:  Head: Normocephalic and atraumatic.  Mouth/Throat: Oropharynx is clear and moist and mucous membranes are normal.  Eyes: Pupils are equal, round, and reactive to light. Conjunctivae are normal.  Cardiovascular: Regular rhythm. Bradycardia present.  Murmur (SEM I-II/VI, LUSB and RUSB) heard. Pulses:      Dorsalis pedis pulses are 2+ on the right side, and 2+ on the left side.  Respiratory: Effort normal and breath sounds normal. No respiratory distress.  GI: Soft. He exhibits no mass. There is no hepatomegaly. There is no tenderness.  Musculoskeletal: He exhibits edema.  Lymphadenopathy:    He has no cervical adenopathy.  Neurological: He is alert and oriented to person, place, and time. He has normal strength. No cranial nerve deficit. Gait normal.  Skin: Skin is warm. No  rash noted. No erythema.  Psychiatric: His mood appears anxious. Cognition and memory are normal.  Well groomed, good eye contact.       ASSESSMENT AND PLAN:   [name] was seen today for 6 months follow-up.  Orders Placed This Encounter  Procedures  . Basic metabolic panel  . Fructosamine  . POCT glycosylated hemoglobin (Hb A1C)    Lab Results  Component Value Date   CREATININE 0.64 02/08/2018   BUN 19 02/08/2018   NA 142 02/08/2018   K 4.3 02/08/2018   CL 104 02/08/2018   CO2 27 02/08/2018   Lab Results  Component Value Date   HGBA1C 6.3 (A) 02/08/2018    Diabetes mellitus type 2 with neurological manifestations ([place])  HgA1C at goal. No changes in current management. Regular exercise as tolerated and healthy diet with avoidance of added sugar food intake is an important part of treatment and recommended. Annual eye exam, periodic dental and foot care recommended. F/U in 5-6 months  -     POCT glycosylated hemoglobin (Hb A1C) -     Basic metabolic panel -     Fructosamine  Hypertension with heart disease  Not well controlled. Possible complications of elevated BP discussed. Valsartan 40 mg daily. Annual eye examination. Continue checking BP at home. F/U in 3-4 months.  -     valsartan (DIOVAN) 40 MG tablet; Take 1 tablet (40 mg total) by mouth daily.  Chronic diarrhea  Improved with Imodium. He is not interested in GI referral. Adequate hydration. No change in current management.  Peripheral polyneuropathy  Stable. Good foot care and adequate DM controlled recommended.     Tobie Hellen G. [name], MD  [facility] [place] office.

## 2018-02-09 LAB — BASIC METABOLIC PANEL WITH GFR
BUN: 19 mg/dL (ref 6–23)
CO2: 27 meq/L (ref 19–32)
Calcium: 9.6 mg/dL (ref 8.4–10.5)
Chloride: 104 meq/L (ref 96–112)
Creatinine, Ser: 0.64 mg/dL (ref 0.40–1.50)
GFR: 126.64 mL/min
Glucose, Bld: 98 mg/dL (ref 70–99)
Potassium: 4.3 meq/L (ref 3.5–5.1)
Sodium: 142 meq/L (ref 135–145)

## 2018-02-12 ENCOUNTER — Encounter: Payer: Self-pay | Admitting: Family Medicine

## 2018-02-13 LAB — FRUCTOSAMINE: Fructosamine: 290 umol/L — ABNORMAL HIGH (ref 205–285)

## 2018-03-18 MED ORDER — PHENYTOIN SODIUM EXTENDED 100 MG PO CAPS: 100.0000 mg | ORAL_CAPSULE | Freq: Two times a day (BID) | ORAL | 0 refills | Status: DC

## 2018-03-29 ENCOUNTER — Encounter: Payer: Self-pay | Admitting: Family Medicine

## 2018-03-29 ENCOUNTER — Ambulatory Visit (INDEPENDENT_AMBULATORY_CARE_PROVIDER_SITE_OTHER): Payer: Medicare HMO | Admitting: Family Medicine

## 2018-03-29 VITALS — BP 126/80 | HR 78 | Temp 97.7°F | Resp 16 | Ht 63.0 in | Wt 177.2 lb

## 2018-03-29 DIAGNOSIS — R32 Unspecified urinary incontinence: Secondary | ICD-10-CM | POA: Insufficient documentation

## 2018-03-29 DIAGNOSIS — N401 Enlarged prostate with lower urinary tract symptoms: Secondary | ICD-10-CM

## 2018-03-29 DIAGNOSIS — R600 Localized edema: Secondary | ICD-10-CM

## 2018-03-29 DIAGNOSIS — H6063 Unspecified chronic otitis externa, bilateral: Secondary | ICD-10-CM | POA: Diagnosis not present

## 2018-03-29 DIAGNOSIS — R252 Cramp and spasm: Secondary | ICD-10-CM | POA: Diagnosis not present

## 2018-03-29 DIAGNOSIS — K529 Noninfective gastroenteritis and colitis, unspecified: Secondary | ICD-10-CM

## 2018-03-29 DIAGNOSIS — N3945 Continuous leakage: Secondary | ICD-10-CM

## 2018-03-29 DIAGNOSIS — N138 Other obstructive and reflux uropathy: Secondary | ICD-10-CM | POA: Diagnosis not present

## 2018-03-29 LAB — COMPREHENSIVE METABOLIC PANEL
ALK PHOS: 81 U/L (ref 39–117)
ALT: 18 U/L (ref 0–53)
AST: 17 U/L (ref 0–37)
Albumin: 4.6 g/dL (ref 3.5–5.2)
BILIRUBIN TOTAL: 0.5 mg/dL (ref 0.2–1.2)
BUN: 15 mg/dL (ref 6–23)
CO2: 30 mEq/L (ref 19–32)
CREATININE: 0.61 mg/dL (ref 0.40–1.50)
Calcium: 9.3 mg/dL (ref 8.4–10.5)
Chloride: 105 mEq/L (ref 96–112)
GFR: 133.81 mL/min (ref >60.00)
GLUCOSE: 185 mg/dL — AB (ref 70–99)
Potassium: 4.4 mEq/L (ref 3.5–5.1)
Sodium: 142 mEq/L (ref 135–145)
TOTAL PROTEIN: 6.6 g/dL (ref 6.0–8.3)

## 2018-03-29 LAB — BRAIN NATRIURETIC PEPTIDE: PRO B NATRI PEPTIDE: 171 pg/mL — AB (ref 0.0–100.0)

## 2018-03-29 MED ORDER — DEXAMETHASONE 0.1 % OP SUSP: 1.0000 [drp] | Freq: Two times a day (BID) | OPHTHALMIC | 2 refills | Status: DC | PRN

## 2018-03-29 MED ORDER — TRIAMCINOLONE 0.1 % CREAM:EUCERIN CREAM 1:1: 1.0000 "application " | TOPICAL_CREAM | Freq: Two times a day (BID) | CUTANEOUS | 1 refills | Status: DC | PRN

## 2018-03-29 MED ORDER — FUROSEMIDE 40 MG PO TABS: 20.0000 mg | ORAL_TABLET | Freq: Two times a day (BID) | ORAL | 2 refills | Status: DC

## 2018-03-29 MED ORDER — TIZANIDINE HCL 6 MG PO CAPS: 6.0000 mg | ORAL_CAPSULE | Freq: Every day | ORAL | 0 refills | Status: DC

## 2018-03-29 NOTE — Progress Notes (Signed)
ACUTE VISIT   HPI:  Chief Complaint  Patient presents with  . Diarrhea  . Joint Swelling    swollen ankles  . Leg Pain    leg and feet pain    AndrewCALAN Gamble is a 84 y.o. male with Hx of DM II,CHF,HTN,HLD,OSA not on CPAP,and generalized OA is here today with his wife to address some concerns.    Pruritic skin rash: Probably has been going on for about 2 years. He was evaluated by dermatologist, who according to patient, prescribed 2 types of topical medications.  He states that he has not been very consistent with instructions given by dermatologist or with medication. Problem seems to be stable. He has not identified exacerbating factors. Rash is localized on back and lower extremities.  Diarrhea: Chronic problem. He has not identified exacerbating or alleviating factors. Imodium does not seem to be helping now. In the past he has been on loperamide, which did not help much. He has not noted blood in the stool. He has had a couple of stool incontinence this year.  Ears itching: This has been going on for a while. He thinks he may be related with wax excess. He uses Q-tips frequently. He denies worsening hearing loss, he wears hearing aids. Denies earache or drainage. He has not used OTC treatment.  Lower extremity edema: Currently he is on furosemide 40 mg daily + an extra tablet if needed in the afternoon.  He has not been consistent with taking the medication due to side effects. His wife is concerned about lower extremity edema, which is a chronic problem. In the morning when he first gets up edema is "almost gone." Problem is elevated by prolonged standing/walking, worse at the end of the day. Negative for erythema.  Echo done on 08/31/2016 showed LVEF 60 to 35%, grade 2 diastolic dysfunction.  History of OA, mainly hips and knees. Pain is exacerbated by prolonged walking and alleviated by rest. He has not noted joint erythema or  erythema. Intra-articular steroid injections are not longer effective to treat knee pain.  Leg cramps: Currently he is on Zanaflex 4 mg daily. Problem is usually at night, interfering with his sleep. Problem started suddenly while he is in bed, he has to get up and walk for a few minutes until cramp is gone. Moving lower extremities while he is in bed does not help with cramps.  He states that sometimes he drinks alcohol at bedtime to help him "relax."  He denies alcohol abuse, his wife thinks he drinks "too much."  He follows with Dr. Carles Collet, started on phenytoin 100 mg daily.  He feels like current treatment has helped some. He tried Zanaflex 6 mg and feels like cramps greatly improved. Denies side effects of Zanaflex.  Urinary incontinence: History of BPH with nocturia and obstruction. Currently he is on Flomax and Proscar. He followed with urologist a few years ago. Urine frequency and constant urine leakage. Symptoms getting worse.  He denies gross hematuria or dysuria.    Review of Systems  Constitutional: Positive for fatigue. Negative for activity change, appetite change and fever.  HENT: Positive for hearing loss. Negative for nosebleeds, sore throat and trouble swallowing.   Eyes: Negative for redness and visual disturbance.  Respiratory: Positive for shortness of breath (stable.). Negative for cough and wheezing.   Cardiovascular: Negative for chest pain, palpitations and leg swelling.  Gastrointestinal: Positive for abdominal pain (Occasional cramps) and diarrhea. Negative for nausea and vomiting.  Genitourinary: Positive for frequency. Negative for decreased urine volume, dysuria and hematuria.  Musculoskeletal: Positive for arthralgias, back pain, gait problem and myalgias.  Skin: Positive for rash. Negative for wound.  Allergic/Immunologic: Positive for environmental allergies.  Neurological: Negative for syncope, weakness and headaches.  Psychiatric/Behavioral:  Positive for sleep disturbance. The patient is nervous/anxious.       Current Outpatient Medications on File Prior to Visit  Medication Sig Dispense Refill  . Albuterol Sulfate (PROAIR HFA IN) Inhale into the lungs as needed.     . budesonide-formoterol (SYMBICORT) 80-4.5 MCG/ACT inhaler Inhale 2 puffs into the lungs 2 (two) times daily.    . Cyanocobalamin (B-12) 1000 MCG TABS Take 1,000 mcg by mouth daily.    Marland Kitchen doxazosin (CARDURA) 4 MG tablet Take 1 tablet (4 mg total) by mouth daily. 90 tablet 1  . finasteride (PROSCAR) 5 MG tablet Take 1 tablet (5 mg total) by mouth daily. 90 tablet 2  . glipiZIDE (GLUCOTROL) 5 MG tablet TAKE 1 TABLET BY MOUTH TWICE A DAY BEFORE A MEAL 180 tablet 2  . Glucose Blood (FREESTYLE LITE TEST VI)     . ONE TOUCH ULTRA TEST test strip USE TO CHECK BLOOD SUGAR DAILY 100 each 4  . ONETOUCH DELICA LANCETS 63O MISC USE TO CHECK BLOOD SUGAR DAILY AND PRN 100 each 4  . phenytoin (DILANTIN) 100 MG ER capsule Take 1 capsule (100 mg total) by mouth 2 (two) times daily. 180 capsule 0  . tamsulosin (FLOMAX) 0.4 MG CAPS capsule Take 1 capsule (0.4 mg total) by mouth daily after supper. 90 capsule 2  . valsartan (DIOVAN) 40 MG tablet Take 1 tablet (40 mg total) by mouth daily. 90 tablet 1   Current Facility-Administered Medications on File Prior to Visit  Medication Dose Route Frequency Provider Last Rate Last Dose  . triamcinolone acetonide (KENALOG-40) injection 40 mg  40 mg Intramuscular Once [name], Skarlett Sedlacek G, MD         Past Medical History:  Diagnosis Date  . BACK PAIN, CHRONIC 04/18/2009  . BENIGN PROSTATIC HYPERTROPHY, HX OF 01/19/2007  . Carpal tunnel syndrome 03/24/2007  . CORONARY ARTERY DISEASE 01/19/2007  . DEPRESSION, CHRONIC 06/24/2007  . DIABETES MELLITUS 01/19/2007  . DIABETES MELLITUS, TYPE II, WITH NEUROLOGICAL COMPLICATIONS 10/16/6431  . Diarrhea 02/23/2007  . HEART MURMUR, SYSTOLIC 2/95/1884  . HERPES SIMPLEX INFECTION 01/19/2007  . HYPERLIPIDEMIA  01/19/2007  . HYPERTENSION 01/19/2007  . Intermediate coronary syndrome ([place]) 08/29/2008  . LEG CRAMPS, NOCTURNAL 10/28/2007  . Leg cramps, sleep related 08/05/2010  . OBSTRUCTIVE SLEEP APNEA 01/19/2007   does not use CPAP  . OSTEOARTHRITIS 06/24/2007  . Other postprocedural status(V45.89) 01/19/2007  . PERCUTANEOUS TRANSLUMINAL CORONARY ANGIOPLASTY, HX OF 01/19/2007  . PLANTAR FASCIITIS, RIGHT 01/19/2007  . SHOULDER PAIN, LEFT 03/24/2007  . SPINAL STENOSIS, LUMBAR 06/03/2009  . TINEA PEDIS 11/15/2009  . URI 01/11/2009   No Known Allergies  Social History   Socioeconomic History  . Marital status: Married    Spouse name: Not on file  . Number of children: Not on file  . Years of education: Not on file  . Highest education level: Not on file  Occupational History  . Occupation: retired    Comment: Truck Research officer, political party  Social Needs  . Financial resource strain: Not on file  . Food insecurity:    Worry: Not on file    Inability: Not on file  . Transportation needs:    Medical: Not on file  Non-medical: Not on file  Tobacco Use  . Smoking status: Former Smoker    Last attempt to quit: 04/14/1983    Years since quitting: 34.9  . Smokeless tobacco: Never Used  . Tobacco comment: stopped 1985  Substance and Sexual Activity  . Alcohol use: Yes    Comment: 24 oz daily wine  . Drug use: Not Currently    Types: Marijuana    Comment: occ  . Sexual activity: Not on file  Lifestyle  . Physical activity:    Days per week: Not on file    Minutes per session: Not on file  . Stress: Not on file  Relationships  . Social connections:    Talks on phone: Not on file    Gets together: Not on file    Attends religious service: Not on file    Active member of club or organization: Not on file    Attends meetings of clubs or organizations: Not on file    Relationship status: Not on file  Other Topics Concern  . Not on file  Social History Narrative   Married in 1959 - 60yrs, divorced;  married 1974 - 1.5 years, divorced; married 1985 1 daughter 50; 21 step daughters; 2 grandchildren.    Vitals:   03/29/18 1210  BP: 126/80  Pulse: 78  Resp: 16  Temp: 97.7 F (36.5 C)  SpO2: 96%   Body mass index is 31.4 kg/m.   Physical Exam  Nursing note reviewed. Constitutional: He is oriented to person, place, and time. He appears well-developed. No distress.  HENT:  Head: Normocephalic and atraumatic.  Mouth/Throat: Oropharynx is clear and moist and mucous membranes are normal.  Eyes: Pupils are equal, round, and reactive to light. Conjunctivae are normal.  Cardiovascular: Normal rate and regular rhythm.  Murmur (Dyastolic I-II/VI RUSB, SEM I/VI LUSB) heard. Pulses:      Dorsalis pedis pulses are 2+ on the right side and 2+ on the left side.  Respiratory: Effort normal and breath sounds normal. No respiratory distress.  GI: Soft. He exhibits no mass. There is no hepatomegaly. There is no abdominal tenderness.  Musculoskeletal:        General: Edema (1+ pitting LE edema,bilateral.) present.  Lymphadenopathy:    He has no cervical adenopathy.  Neurological: He is alert and oriented to person, place, and time. He has normal strength. No cranial nerve deficit. Gait abnormal.  Unstable gait assisted with a cane.  Skin: Skin is warm. Rash noted. Rash is maculopapular (On lower extremities, scaly.). No erythema.  Psychiatric: His mood appears anxious.  Well groomed, good eye contact.      ASSESSMENT AND PLAN:  Mr. Jorel was seen today for diarrhea, joint swelling and leg pain.  Orders Placed This Encounter  Procedures  . Brain Natriuretic Peptide  . Comprehensive metabolic panel  . Ambulatory referral to Urology   Lab Results  Component Value Date   ALT 18 03/29/2018   AST 17 03/29/2018   ALKPHOS 81 03/29/2018   BILITOT 0.5 03/29/2018   Lab Results  Component Value Date   CREATININE 0.61 03/29/2018   BUN 15 03/29/2018   NA 142 03/29/2018   K 4.4  03/29/2018   CL 105 03/29/2018   CO2 30 03/29/2018    Cramp of both lower extremities Zanaflex dose increased from 4 mg to 6 mg. We discussed some side effects of Zanaflex. Fall precautions. He is also following with neurologist, Dr. Carles Collet. No changes in phenytoin dose. Follow-up in  3 months.  BPH with urinary obstruction Still symptomatic despite of taking Flomax and Proscar. He agrees with urology evaluation.  Urine incontinence Referral to urology placed.  Chronic diarrhea Problem has been stable. We discussed a few treatment options as well as side effects. Viberzi is an option but I do not feel comfortable given his history of frequent alcohol intake. He is not interested in GI evaluation.  Bilateral lower extremity edema We discussed possible etiologies. He is not taking furosemide consistently because aggravates urine frequency. Lower extremity elevation above waist level and/or compression stockings may help. Recommend taking furosemide 20 mg in the morning and 20 mg around noon (if he is not planning on going out).  Educated about the importance of compliance. We discussed some side effects of furosemide. Further recommendation will be given according to BNP results.  Chronic otitis externa of both ears We discussed possible etiologies,?  Fungal (seborrheic dermatitis) versus eczema. Recommend topical steroids daily as needed. Follow-up as needed.     40 min face to face OV. > 50% was dedicated to discussion of Dx, prognosis, treatment options, and some side effects of medications as well as coordination of care.    Return in about 3 months (around 06/28/2018).      Derrick Orris G. [name], MD  [facility]. [place] office.

## 2018-03-29 NOTE — Assessment & Plan Note (Signed)
Problem has been stable. We discussed a few treatment options as well as side effects. Viberzi is an option but I do not feel comfortable given his history of frequent alcohol intake. He is not interested in GI evaluation.

## 2018-03-29 NOTE — Assessment & Plan Note (Signed)
Zanaflex dose increased from 4 mg to 6 mg. We discussed some side effects of Zanaflex. Fall precautions. He is also following with neurologist, Dr. Carles Collet. No changes in phenytoin dose. Follow-up in 3 months.

## 2018-03-29 NOTE — Assessment & Plan Note (Signed)
We discussed possible etiologies,?  Fungal (seborrheic dermatitis) versus eczema. Recommend topical steroids daily as needed. Follow-up as needed.

## 2018-03-29 NOTE — Patient Instructions (Addendum)
A few things to remember from today's visit:   Bilateral lower extremity edema - Plan: Brain Natriuretic Peptide, Comprehensive metabolic panel  BPH associated with nocturia - Plan: Ambulatory referral to Urology  Continuous leakage of urine - Plan: Ambulatory referral to Urology  Cramp of both lower extremities  Chronic diarrhea  Zanaflex dose increased from 4 mg to 6 mg. Take furosemide 20 mg in the morning and 20 mg between 12 and 2 PM.  I will place referral for palliative care.  Please be sure medication list is accurate. If a new problem present, please set up appointment sooner than planned today.

## 2018-03-29 NOTE — Assessment & Plan Note (Signed)
We discussed possible etiologies. He is not taking furosemide consistently because aggravates urine frequency. Lower extremity elevation above waist level and/or compression stockings may help. Recommend taking furosemide 20 mg in the morning and 20 mg around noon (if he is not planning on going out).  Educated about the importance of compliance. We discussed some side effects of furosemide. Further recommendation will be given according to BNP results.

## 2018-03-29 NOTE — Assessment & Plan Note (Signed)
Still symptomatic despite of taking Flomax and Proscar. He agrees with urology evaluation.

## 2018-03-29 NOTE — Assessment & Plan Note (Signed)
Referral to urology placed

## 2018-03-30 ENCOUNTER — Telehealth: Payer: Self-pay | Admitting: Family Medicine

## 2018-03-30 NOTE — Telephone Encounter (Signed)
Spoke with pharmacy and clarified per Dr. [name] that it is an eye drop that patient is to use in both ears as discussed at office visit.

## 2018-03-30 NOTE — Telephone Encounter (Signed)
Copied from [place] [id number]. Topic: Quick Communication - See Telephone Encounter >> Mar 30, 2018 11:20 AM Rutherford Nail, NT wrote: CRM for notification. See Telephone encounter for: 03/30/18. Sarah with [facility] calling and states that they are needing clarification on the dexamethasone (DECADRON) 0.1 % ophthalmic suspension. States that the instructions says to do Place 1 drop into both eyes 2 (two) times daily as needed (ears). Please advise.

## 2018-03-30 NOTE — Telephone Encounter (Signed)
Please clarify directions of use -- is this for the eyes or ears?

## 2018-03-31 DIAGNOSIS — H401111 Primary open-angle glaucoma, right eye, mild stage: Secondary | ICD-10-CM | POA: Diagnosis not present

## 2018-03-31 DIAGNOSIS — Z961 Presence of intraocular lens: Secondary | ICD-10-CM | POA: Diagnosis not present

## 2018-03-31 DIAGNOSIS — H2511 Age-related nuclear cataract, right eye: Secondary | ICD-10-CM | POA: Diagnosis not present

## 2018-03-31 DIAGNOSIS — H401121 Primary open-angle glaucoma, left eye, mild stage: Secondary | ICD-10-CM | POA: Diagnosis not present

## 2018-03-31 DIAGNOSIS — H4010X1 Unspecified open-angle glaucoma, mild stage: Secondary | ICD-10-CM | POA: Diagnosis not present

## 2018-03-31 DIAGNOSIS — H26492 Other secondary cataract, left eye: Secondary | ICD-10-CM | POA: Diagnosis not present

## 2018-04-02 ENCOUNTER — Encounter: Payer: Self-pay | Admitting: Family Medicine

## 2018-04-19 ENCOUNTER — Ambulatory Visit (INDEPENDENT_AMBULATORY_CARE_PROVIDER_SITE_OTHER): Payer: Medicare HMO

## 2018-04-19 ENCOUNTER — Ambulatory Visit (INDEPENDENT_AMBULATORY_CARE_PROVIDER_SITE_OTHER): Payer: Medicare HMO | Admitting: Family Medicine

## 2018-04-19 ENCOUNTER — Other Ambulatory Visit: Payer: Self-pay | Admitting: Family Medicine

## 2018-04-19 ENCOUNTER — Encounter: Payer: Self-pay | Admitting: Family Medicine

## 2018-04-19 VITALS — BP 130/88 | HR 89 | Temp 98.4°F | Resp 16 | Ht 63.0 in | Wt 173.4 lb

## 2018-04-19 DIAGNOSIS — Y92009 Unspecified place in unspecified non-institutional (private) residence as the place of occurrence of the external cause: Secondary | ICD-10-CM

## 2018-04-19 DIAGNOSIS — S99911A Unspecified injury of right ankle, initial encounter: Secondary | ICD-10-CM

## 2018-04-19 DIAGNOSIS — S4991XA Unspecified injury of right shoulder and upper arm, initial encounter: Secondary | ICD-10-CM

## 2018-04-19 DIAGNOSIS — W19XXXA Unspecified fall, initial encounter: Secondary | ICD-10-CM

## 2018-04-19 DIAGNOSIS — J441 Chronic obstructive pulmonary disease with (acute) exacerbation: Secondary | ICD-10-CM | POA: Diagnosis not present

## 2018-04-19 DIAGNOSIS — M19011 Primary osteoarthritis, right shoulder: Secondary | ICD-10-CM | POA: Diagnosis not present

## 2018-04-19 DIAGNOSIS — M25571 Pain in right ankle and joints of right foot: Secondary | ICD-10-CM | POA: Diagnosis not present

## 2018-04-19 DIAGNOSIS — J988 Other specified respiratory disorders: Secondary | ICD-10-CM | POA: Diagnosis not present

## 2018-04-19 DIAGNOSIS — S92351A Displaced fracture of fifth metatarsal bone, right foot, initial encounter for closed fracture: Secondary | ICD-10-CM | POA: Diagnosis not present

## 2018-04-19 DIAGNOSIS — R5382 Chronic fatigue, unspecified: Secondary | ICD-10-CM

## 2018-04-19 DIAGNOSIS — M159 Polyosteoarthritis, unspecified: Secondary | ICD-10-CM

## 2018-04-19 DIAGNOSIS — M25511 Pain in right shoulder: Secondary | ICD-10-CM | POA: Diagnosis not present

## 2018-04-19 DIAGNOSIS — R6889 Other general symptoms and signs: Secondary | ICD-10-CM

## 2018-04-19 DIAGNOSIS — G629 Polyneuropathy, unspecified: Secondary | ICD-10-CM

## 2018-04-19 MED ORDER — DOXYCYCLINE HYCLATE 100 MG PO TABS: 100.0000 mg | ORAL_TABLET | Freq: Two times a day (BID) | ORAL | 0 refills | Status: AC

## 2018-04-19 NOTE — Progress Notes (Signed)
ACUTE VISIT  HPI:  Chief Complaint  Patient presents with  . Cough    sx started 12 days ago, wife has flu A  . Nasal Congestion  . Fall    fell 12 days ago, pain in right shoulder and foot, bruuise on chest and right foot    [name] is a 85 y.o.male with Hx of OSA,recurrent wheezing (COPD), and generalized OA with unstable gait is here today with his wife complaining of 12 days of respiratory symptoms. Intermittent cough and wheezing, exacerbated by exertion none when lying down at night. Productive cough with yellowish sputum,denies hemoptysis. Subjective fever for a few days when symptoms started. Rattling chest at night noted by wife.  Cough is exacerbated by exertion and by lying down. He has not noted orthopnea or PND.  Nasal congestion and rhinorrhea,improved.  Hx of COPD, he has used Albuterol inh. He does not feel like medication helps but his wife thinks otherwise.   Hx of chronic diarrhea but has not had problem since respiratory illness started. Negative for abdominal pain,nausea,or vomiting.   Also c/o right shoulder,foot,and ankle pain after fall about 12 days ago. When above problem started,he was feeling fatigue and lightheaded. His wife was helping him to go back to bed, she was walking in front of him and he had hands on her shoulders. He tripped,lost balance and fell. Landed on right side of his body.  Right side chest ecchymosis,. Right shoulder limitation of movement and pain, no deformity. Right ankle edema and pain with walking. No deformity.  Knee pain and shoulder pain improving.   Hx of unstable gait,he uses a cane.   Cough  This is a new problem. The current episode started 1 to 4 weeks ago. The problem has been unchanged. The cough is productive of sputum. Associated symptoms include myalgias, nasal congestion, postnasal drip, rhinorrhea and wheezing. Pertinent negatives include no chills, ear pain, eye redness,  headaches, heartburn, hemoptysis, rash, sore throat or shortness of breath. The symptoms are aggravated by lying down and exercise. He has tried a beta-agonist inhaler for the symptoms. The treatment provided mild relief. His past medical history is significant for COPD and environmental allergies.    No Hx of recent travel. Wife diagnosed with influenza A 12 days ago. No known insect bite.  Hx of allergies: Yes, allergic rhinitis. Former smoker.  OTC medications for this problem: None.    Review of Systems  Constitutional: Positive for activity change, appetite change and fatigue. Negative for chills.  HENT: Positive for congestion, postnasal drip and rhinorrhea. Negative for ear pain, mouth sores, sneezing and sore throat.   Eyes: Negative for discharge and redness.  Respiratory: Positive for cough and wheezing. Negative for hemoptysis, chest tightness and shortness of breath.   Cardiovascular: Positive for leg swelling (stable.).  Gastrointestinal: Negative for abdominal pain, heartburn, nausea and vomiting.  Genitourinary: Negative for decreased urine volume, dysuria and hematuria.  Musculoskeletal: Positive for arthralgias, gait problem, joint swelling and myalgias. Negative for neck pain.  Skin: Negative for rash and wound.  Allergic/Immunologic: Positive for environmental allergies.  Neurological: Negative for weakness and headaches.  Psychiatric/Behavioral: Positive for sleep disturbance. Negative for confusion. The patient is nervous/anxious.       Current Outpatient Medications on File Prior to Visit  Medication Sig Dispense Refill  . Albuterol Sulfate (PROAIR HFA IN) Inhale into the lungs as needed.     . budesonide-formoterol (SYMBICORT) 80-4.5 MCG/ACT inhaler Inhale 2  puffs into the lungs 2 (two) times daily.    . Cyanocobalamin (B-12) 1000 MCG TABS Take 1,000 mcg by mouth daily.    Marland Kitchen dexamethasone (DECADRON) 0.1 % ophthalmic suspension Place 1 drop into both eyes 2  (two) times daily as needed (ears). 15 mL 2  . doxazosin (CARDURA) 4 MG tablet Take 1 tablet (4 mg total) by mouth daily. 90 tablet 1  . finasteride (PROSCAR) 5 MG tablet Take 1 tablet (5 mg total) by mouth daily. 90 tablet 2  . furosemide (LASIX) 40 MG tablet Take 0.5 tablets (20 mg total) by mouth 2 (two) times daily. You may take 1 extra lasix in the evening if you have SOB, LEE, and weight gain > 3lbs in 24 hours 100 tablet 2  . glipiZIDE (GLUCOTROL) 5 MG tablet TAKE 1 TABLET BY MOUTH TWICE A DAY BEFORE A MEAL 180 tablet 2  . Glucose Blood (FREESTYLE LITE TEST VI)     . ONE TOUCH ULTRA TEST test strip USE TO CHECK BLOOD SUGAR DAILY 100 each 4  . ONETOUCH DELICA LANCETS 98X MISC USE TO CHECK BLOOD SUGAR DAILY AND PRN 100 each 4  . phenytoin (DILANTIN) 100 MG ER capsule Take 1 capsule (100 mg total) by mouth 2 (two) times daily. 180 capsule 0  . tamsulosin (FLOMAX) 0.4 MG CAPS capsule Take 1 capsule (0.4 mg total) by mouth daily after supper. 90 capsule 2  . tizanidine (ZANAFLEX) 6 MG capsule Take 1 capsule (6 mg total) by mouth at bedtime. 90 capsule 0  . Triamcinolone Acetonide (TRIAMCINOLONE 0.1 % CREAM : EUCERIN) CREA Apply 1 application topically 2 (two) times daily as needed for rash or itching. 500 each 1  . valsartan (DIOVAN) 40 MG tablet Take 1 tablet (40 mg total) by mouth daily. 90 tablet 1   Current Facility-Administered Medications on File Prior to Visit  Medication Dose Route Frequency Provider Last Rate Last Dose  . triamcinolone acetonide (KENALOG-40) injection 40 mg  40 mg Intramuscular Once [name], Dinah Lupa G, MD         Past Medical History:  Diagnosis Date  . BACK PAIN, CHRONIC 04/18/2009  . BENIGN PROSTATIC HYPERTROPHY, HX OF 01/19/2007  . Carpal tunnel syndrome 03/24/2007  . CORONARY ARTERY DISEASE 01/19/2007  . DEPRESSION, CHRONIC 06/24/2007  . DIABETES MELLITUS 01/19/2007  . DIABETES MELLITUS, TYPE II, WITH NEUROLOGICAL COMPLICATIONS 05/15/1939  . Diarrhea 02/23/2007  .  HEART MURMUR, SYSTOLIC 7/40/8144  . HERPES SIMPLEX INFECTION 01/19/2007  . HYPERLIPIDEMIA 01/19/2007  . HYPERTENSION 01/19/2007  . Intermediate coronary syndrome ([place]) 08/29/2008  . LEG CRAMPS, NOCTURNAL 10/28/2007  . Leg cramps, sleep related 08/05/2010  . OBSTRUCTIVE SLEEP APNEA 01/19/2007   does not use CPAP  . OSTEOARTHRITIS 06/24/2007  . Other postprocedural status(V45.89) 01/19/2007  . PERCUTANEOUS TRANSLUMINAL CORONARY ANGIOPLASTY, HX OF 01/19/2007  . PLANTAR FASCIITIS, RIGHT 01/19/2007  . SHOULDER PAIN, LEFT 03/24/2007  . SPINAL STENOSIS, LUMBAR 06/03/2009  . TINEA PEDIS 11/15/2009  . URI 01/11/2009   No Known Allergies  Social History   Socioeconomic History  . Marital status: Married    Spouse name: Not on file  . Number of children: Not on file  . Years of education: Not on file  . Highest education level: Not on file  Occupational History  . Occupation: retired    Comment: Truck Research officer, political party  Social Needs  . Financial resource strain: Not on file  . Food insecurity:    Worry: Not on file    Inability:  Not on file  . Transportation needs:    Medical: Not on file    Non-medical: Not on file  Tobacco Use  . Smoking status: Former Smoker    Last attempt to quit: 04/14/1983    Years since quitting: 35.0  . Smokeless tobacco: Never Used  . Tobacco comment: stopped 1985  Substance and Sexual Activity  . Alcohol use: Yes    Comment: 24 oz daily wine  . Drug use: Not Currently    Types: Marijuana    Comment: occ  . Sexual activity: Not on file  Lifestyle  . Physical activity:    Days per week: Not on file    Minutes per session: Not on file  . Stress: Not on file  Relationships  . Social connections:    Talks on phone: Not on file    Gets together: Not on file    Attends religious service: Not on file    Active member of club or organization: Not on file    Attends meetings of clubs or organizations: Not on file    Relationship status: Not on file  Other Topics  Concern  . Not on file  Social History Narrative   Married in 1959 - 32yrs, divorced; married 1974 - 1.5 years, divorced; married 1985 1 daughter 15; 76 step daughters; 2 grandchildren.    Vitals:   04/19/18 1431  BP: 130/88  Pulse: 89  Resp: 16  Temp: 98.4 F (36.9 C)  SpO2: 95%   Body mass index is 30.71 kg/m.    Physical Exam  Nursing note and vitals reviewed. Constitutional: He is oriented to person, place, and time. He appears well-developed. He does not appear ill. No distress.  HENT:  Head: Normocephalic and atraumatic.  Nose: Rhinorrhea present. Right sinus exhibits no maxillary sinus tenderness and no frontal sinus tenderness. Left sinus exhibits no maxillary sinus tenderness and no frontal sinus tenderness.  Mouth/Throat: Oropharynx is clear and moist and mucous membranes are normal.  Eyes: Conjunctivae and EOM are normal.  Cardiovascular: Normal rate and regular rhythm.  Murmur (Diastolic I-II RUSB and SEM LUSB I/VI) heard. Respiratory: Effort normal. No respiratory distress. He has no wheezes. He has no rhonchi.  Prolonged expiration.  Musculoskeletal:     Right shoulder: He exhibits decreased range of motion and tenderness. He exhibits no bony tenderness and no swelling.     Right ankle: He exhibits decreased range of motion and swelling. He exhibits no deformity. Tenderness. Medial malleolus tenderness found.     Cervical back: He exhibits no tenderness and no bony tenderness.     Thoracic back: He exhibits no tenderness and no bony tenderness.       Feet:     Comments: Mild limitation of right shoulder range of motion, it elicits pain. Pain upon palpation of 5th metatarsus right foot. No deformity.    Lymphadenopathy:    He has no cervical adenopathy.  Neurological: He is alert and oriented to person, place, and time. He has normal strength. Gait abnormal.  Gait assisted by a cane.  Skin: Skin is warm. Ecchymosis noted. No rash noted. No erythema.      Psychiatric: He has a normal mood and affect. His speech is normal.  Well groomed, good eye contact.      ASSESSMENT AND PLAN:   [name] was seen today for cough, nasal congestion and fall.  Diagnoses and all orders for this visit:  Fall in home, initial encounter Fall precautions discussed. He  is not interested in PT for now. Risk factors OA,peripheral neuropathy,and medications.  -     DG Foot Complete Right; Future -     DG Shoulder Right; Future -     DG Clavicle Right; Future -     DG Ankle Complete Right; Future  Injury of right ankle, initial encounter Tender malleolus. Further recommendations according to imaging results. LE elevation and local ice.  -     DG Foot Complete Right; Future -     DG Ankle Complete Right; Future  Injury of right shoulder, initial encounter ? Sprain shoulder. Pain is improving, wife still concerned. ROM exercises recommended.  Explained that ecchymosis can take a few weeks to completely resolved.  -     DG Shoulder Right; Future  Respiratory tract infection Lung auscultation negative for rales. Given his Hx of DM,COPD,and CHF I recommend abx treatment. Explained that cough and congestion can last a few weeks.  -     doxycycline (VIBRA-TABS) 100 MG tablet; Take 1 tablet (100 mg total) by mouth 2 (two) times daily for 7 days.  COPD exacerbation (HCC) Continue Albuterol inh, Albuterol inh 2 puff every 6 hours for a week then as needed for wheezing or shortness of breath.  Recommend using spacer. Side effects of abx discussed. Instructed about warning signs.  -     doxycycline (VIBRA-TABS) 100 MG tablet; Take 1 tablet (100 mg total) by mouth 2 (two) times daily for 7 days.     Return if symptoms worsen or fail to improve, for Keep next appt.       Khadim Lundberg G. [name], MD  [facility] [place]. [place] office.

## 2018-04-19 NOTE — Patient Instructions (Addendum)
A few things to remember from today's visit:   Fall in home, initial encounter - Plan: DG Foot Complete Right, DG Shoulder Right, DG Clavicle Right, DG Ankle Complete Right, DG Ankle Complete Right, DG Clavicle Right, DG Shoulder Right, DG Foot Complete Right  Injury of right ankle, initial encounter - Plan: DG Foot Complete Right, DG Ankle Complete Right, DG Ankle Complete Right, DG Foot Complete Right  Injury of right shoulder, initial encounter - Plan: DG Shoulder Right, DG Shoulder Right  Respiratory tract infection - Plan: doxycycline (VIBRA-TABS) 100 MG tablet  COPD exacerbation (HCC) - Plan: doxycycline (VIBRA-TABS) 100 MG tablet  Albuterol inh 2 puff every 4 hours for a week then as needed for wheezing or shortness of breath.   Please be sure medication list is accurate. If a new problem present, please set up appointment sooner than planned today.

## 2018-04-20 ENCOUNTER — Encounter: Payer: Self-pay | Admitting: Family Medicine

## 2018-04-24 ENCOUNTER — Encounter: Payer: Self-pay | Admitting: Family Medicine

## 2018-04-24 MED ORDER — AEROCHAMBER PLUS MISC: 1 refills | Status: DC

## 2018-04-25 DIAGNOSIS — R69 Illness, unspecified: Secondary | ICD-10-CM | POA: Diagnosis not present

## 2018-04-28 ENCOUNTER — Telehealth: Payer: Self-pay

## 2018-04-28 NOTE — Telephone Encounter (Signed)
VM left to schedule visit with Palliative care. 

## 2018-05-04 ENCOUNTER — Telehealth: Payer: Self-pay

## 2018-05-04 NOTE — Telephone Encounter (Signed)
VM left for patient offering to schedule a visit with Palliative Care.

## 2018-05-04 NOTE — Progress Notes (Deleted)
Subjective:   [name] was seen in consultation in the movement disorder clinic at the request of [name], Malka So, MD.  This patient is accompanied in the office by his spouse who supplements the history.  The evaluation is for tremor.  Tremor started approximately 6 months ago and involves the bilateral UE.  Tremor is most noticeable when he goes to do something with fine motor coordination.   There is no family hx of tremor.  Patient worried about the development of Parkinson's disease, as he had a friend who had Parkinson's disease.  Affected by caffeine:  No. (2 cups per day) Affected by alcohol:  Doesn't know (drinks 24 oz of wine per night - "helps me get to sleep") Affected by stress:  More anxious than in the past but doesn't affect tremor Affected by fatigue:  No. Spills soup if on spoon:  Yes.  , it can Spills glass of liquid if full:  Yes.  , sometimes Affects ADL's (tying shoes, brushing teeth, etc):  No.  Current/Previously tried tremor medications: xanax (takes 1 time per month for sleep); gabapentin: 900 mg tid for muscle cramping at night  Current medications that may exacerbate tremor:  Albuterol/ combivent (now off of these meds)  Outside reports reviewed: historical medical records, lab reports, office notes and referral letter/letters.  09/21/17 update: Patient is seen today in follow-up for tremor and cramping.  He is accompanied by his wife who supplements the history.  He was given a prescription for baclofen at last visit.  He reports that didn't help and he d/c it.  He tried tonic water without relief.  has been on quinine in the past.  "I have had nighttime cramping all of my life."  Reports some day cramping as well that has become bothersome.  Reports tremor is a little worse as well.  Notices it most when he does something requiring fine motor coordination, such as changing the battery in his hearing aid.  Records have been reviewed since our last visit.  Has  recently seen his primary care physician as his COPD has not been improved with Symbicort and albuterol.  Pulmonary consultation was recommended.  05/05/18 update: Patient seen today in follow-up for tremor and cramping.  He is accompanied by his wife who supplements the history.  He is on no medication for tremor, which is his choice.  Tremor has been stable.  He has better and worse days.  Last visit, we gave him Dilantin for the cramping.  We called him about a month later to see how he was doing, and he stated that the medication was helping significantly.  Records are reviewed since last visit.  He was seen by his primary care physician on January 7 after a fall 12 days prior.  He did have several x-rays.  He refused physical therapy.  Right foot x-ray showed a a displaced mid distal fifth metatarsal shaft fracture.  He was referred to Ortho urgent care.  No Known Allergies  Outpatient Encounter Medications as of 05/06/2018  Medication Sig  . Albuterol Sulfate (PROAIR HFA IN) Inhale into the lungs as needed.   . budesonide-formoterol (SYMBICORT) 80-4.5 MCG/ACT inhaler Inhale 2 puffs into the lungs 2 (two) times daily.  . Cyanocobalamin (B-12) 1000 MCG TABS Take 1,000 mcg by mouth daily.  Marland Kitchen dexamethasone (DECADRON) 0.1 % ophthalmic suspension Place 1 drop into both eyes 2 (two) times daily as needed (ears).  . doxazosin (CARDURA) 4 MG tablet Take 1  tablet (4 mg total) by mouth daily.  . finasteride (PROSCAR) 5 MG tablet Take 1 tablet (5 mg total) by mouth daily.  . furosemide (LASIX) 40 MG tablet Take 0.5 tablets (20 mg total) by mouth 2 (two) times daily. You may take 1 extra lasix in the evening if you have SOB, LEE, and weight gain > 3lbs in 24 hours  . glipiZIDE (GLUCOTROL) 5 MG tablet TAKE 1 TABLET BY MOUTH TWICE A DAY BEFORE A MEAL  . Glucose Blood (FREESTYLE LITE TEST VI)   . ONE TOUCH ULTRA TEST test strip USE TO CHECK BLOOD SUGAR DAILY  . ONETOUCH DELICA LANCETS 46E MISC USE TO CHECK  BLOOD SUGAR DAILY AND PRN  . phenytoin (DILANTIN) 100 MG ER capsule Take 1 capsule (100 mg total) by mouth 2 (two) times daily.  Marland Kitchen Spacer/Aero-Holding Chambers (AEROCHAMBER PLUS) inhaler Use as instructed to use with inahaler.  . tamsulosin (FLOMAX) 0.4 MG CAPS capsule Take 1 capsule (0.4 mg total) by mouth daily after supper.  . tizanidine (ZANAFLEX) 6 MG capsule Take 1 capsule (6 mg total) by mouth at bedtime.  . Triamcinolone Acetonide (TRIAMCINOLONE 0.1 % CREAM : EUCERIN) CREA Apply 1 application topically 2 (two) times daily as needed for rash or itching.  . valsartan (DIOVAN) 40 MG tablet Take 1 tablet (40 mg total) by mouth daily.   Facility-Administered Encounter Medications as of 05/06/2018  Medication  . triamcinolone acetonide (KENALOG-40) injection 40 mg    Past Medical History:  Diagnosis Date  . BACK PAIN, CHRONIC 04/18/2009  . BENIGN PROSTATIC HYPERTROPHY, HX OF 01/19/2007  . Carpal tunnel syndrome 03/24/2007  . CORONARY ARTERY DISEASE 01/19/2007  . DEPRESSION, CHRONIC 06/24/2007  . DIABETES MELLITUS 01/19/2007  . DIABETES MELLITUS, TYPE II, WITH NEUROLOGICAL COMPLICATIONS 7/0/3500  . Diarrhea 02/23/2007  . HEART MURMUR, SYSTOLIC 9/38/1829  . HERPES SIMPLEX INFECTION 01/19/2007  . HYPERLIPIDEMIA 01/19/2007  . HYPERTENSION 01/19/2007  . Intermediate coronary syndrome ([place]) 08/29/2008  . LEG CRAMPS, NOCTURNAL 10/28/2007  . Leg cramps, sleep related 08/05/2010  . OBSTRUCTIVE SLEEP APNEA 01/19/2007   does not use CPAP  . OSTEOARTHRITIS 06/24/2007  . Other postprocedural status(V45.89) 01/19/2007  . PERCUTANEOUS TRANSLUMINAL CORONARY ANGIOPLASTY, HX OF 01/19/2007  . PLANTAR FASCIITIS, RIGHT 01/19/2007  . SHOULDER PAIN, LEFT 03/24/2007  . SPINAL STENOSIS, LUMBAR 06/03/2009  . TINEA PEDIS 11/15/2009  . URI 01/11/2009    Past Surgical History:  Procedure Laterality Date  . arthroscopy, knee of hx    . CARDIAC CATHETERIZATION    . CATARACT EXTRACTION Left   . COLONOSCOPY    . inguinal  herniorrhapy, hx    . NASAL SINUS SURGERY    . percutaneous transluminal coronary angioplasty, hx of  2004   Dr. Lyndel Safe  . plastic joint in thumb    . PTCA/stent  2004  . TONSILLECTOMY    . VASECTOMY      Social History   Socioeconomic History  . Marital status: Married    Spouse name: Not on file  . Number of children: Not on file  . Years of education: Not on file  . Highest education level: Not on file  Occupational History  . Occupation: retired    Comment: Truck Research officer, political party  Social Needs  . Financial resource strain: Not on file  . Food insecurity:    Worry: Not on file    Inability: Not on file  . Transportation needs:    Medical: Not on file    Non-medical: Not on  file  Tobacco Use  . Smoking status: Former Smoker    Last attempt to quit: 04/14/1983    Years since quitting: 35.0  . Smokeless tobacco: Never Used  . Tobacco comment: stopped 1985  Substance and Sexual Activity  . Alcohol use: Yes    Comment: 24 oz daily wine  . Drug use: Not Currently    Types: Marijuana    Comment: occ  . Sexual activity: Not on file  Lifestyle  . Physical activity:    Days per week: Not on file    Minutes per session: Not on file  . Stress: Not on file  Relationships  . Social connections:    Talks on phone: Not on file    Gets together: Not on file    Attends religious service: Not on file    Active member of club or organization: Not on file    Attends meetings of clubs or organizations: Not on file    Relationship status: Not on file  . Intimate partner violence:    Fear of current or ex partner: Not on file    Emotionally abused: Not on file    Physically abused: Not on file    Forced sexual activity: Not on file  Other Topics Concern  . Not on file  Social History Narrative   Married in 1959 - 53yrs, divorced; married 1974 - 1.5 years, divorced; married 1985 1 daughter 52; 55 step daughters; 2 grandchildren.    Family Status  Relation Name Status  .  Mother  Deceased at age 86       complications of rheum disease  . Father  Deceased at age 79  . Sister  Alive       rheumatism  . Daughter  Alive       '72  . Other family (Not Specified)    Review of Systems ROS    Objective:   VITALS:   There were no vitals filed for this visit. GEN:  The patient appears stated age and is in NAD. HEENT:  Normocephalic, atraumatic.  The mucous membranes are moist. The superficial temporal arteries are without ropiness or tenderness. CV:  RRR Lungs:  CTAB Neck/HEME:  There are no carotid bruits bilaterally.  Neurological examination:  Orientation: The patient is alert and oriented x3. Cranial nerves: There is good facial symmetry. The speech is fluent and clear. Soft palate rises symmetrically and there is no tongue deviation. Hearing is intact to conversational tone. Sensation: Sensation is intact to light touch throughout Motor: Strength is at least antigravity x4.  Movement examination: Tone: There is normal tone in the RUE.  There is normal tone in the LUE.  There is normal tone in the RLE.  There is normal tone in the LLE.  Abnormal movements: There is mild tremor of the outstretched hands. Coordination:  There is no decremation with RAM's, with any form of RAMS, including alternating supination and pronation of the forearm, hand opening and closing, finger taps, heel taps and toe taps. Gait and Station: The patient pushes off of the chair to arise.  He is slightly unstable even with a cane.   Labs:  Lab Results  Component Value Date   TSH 1.68 04/01/2017      Chemistry      Component Value Date/Time   NA 142 03/29/2018 1317   K 4.4 03/29/2018 1317   CL 105 03/29/2018 1317   CO2 30 03/29/2018 1317   BUN 15 03/29/2018 1317  CREATININE 0.61 03/29/2018 1317   CREATININE 0.81 01/15/2017 1555      Component Value Date/Time   CALCIUM 9.3 03/29/2018 1317   ALKPHOS 81 03/29/2018 1317   AST 17 03/29/2018 1317   ALT 18  03/29/2018 1317   BILITOT 0.5 03/29/2018 1317     Lab Results  Component Value Date   VITAMINB12 770 12/17/2017   Lab Results  Component Value Date   HGBA1C 6.3 (A) 02/08/2018     Assessment/Plan:   1.  Tremor  -He asked me about primidone today, but ultimately decided that he did not want to take it.  I agree with this, as tremor is actually quite mild.  -Discussed complex relationship of tremor to alcohol.  Discussed the importance of slowly tapering and ultimately discontinuing alcohol.  2.   Peripheral neuropathy, likely due to diabetes  -Likely the etiology of gait/balance issues.  3.  LE cramping  -wonder if related to alcohol/dehydration at night.  He does not think so.  He has had lower extremity cramping for a long time.  It was better when he was using opioids, and came back when his opioids were discontinued because of concern about long-term opioid use.  -he tried zanaflex and baclofen without relief  -discussed that old AED's like dilantin or tegretol may help but there are risks to these medications.  Wife wants him to try.  Would start with dilantin 100 mg bid.  Risks, benefits, side effects and alternative therapies were discussed.  The opportunity to ask questions was given and they were answered to the best of my ability.  The patient expressed understanding and willingness to follow the outlined treatment protocols.  We will call him in a month and see how he is doing.  5.  b12 deficiency  -would recommend a supplement.  He forgot that I had recommended this previously.  Recommend B12, 1000 mcg daily.  6.  Follow-up will depend on whether or not he decides to stay on medication.  Will assess that when we call him in a month or so.  CC:  [name], [name], MD

## 2018-05-06 ENCOUNTER — Telehealth: Payer: Self-pay

## 2018-05-06 ENCOUNTER — Ambulatory Visit: Payer: Medicare HMO | Admitting: Neurology

## 2018-05-06 NOTE — Telephone Encounter (Signed)
Phone call placed to patient to provide explanation of palliative care services. Patient declined palliative care at this time. Will notify PCP

## 2018-05-23 ENCOUNTER — Encounter: Payer: Self-pay | Admitting: Family Medicine

## 2018-05-27 ENCOUNTER — Other Ambulatory Visit: Payer: Self-pay | Admitting: Family Medicine

## 2018-05-27 DIAGNOSIS — E1140 Type 2 diabetes mellitus with diabetic neuropathy, unspecified: Secondary | ICD-10-CM

## 2018-05-27 MED ORDER — DOXAZOSIN MESYLATE 4 MG PO TABS: 4.0000 mg | ORAL_TABLET | Freq: Every day | ORAL | 2 refills | Status: DC

## 2018-05-27 MED ORDER — GLIPIZIDE 5 MG PO TABS: ORAL_TABLET | ORAL | 1 refills | Status: DC

## 2018-06-06 ENCOUNTER — Other Ambulatory Visit: Payer: Self-pay | Admitting: *Deleted

## 2018-06-06 DIAGNOSIS — R252 Cramp and spasm: Secondary | ICD-10-CM

## 2018-06-06 MED ORDER — TIZANIDINE HCL 2 MG PO CAPS: 6.0000 mg | ORAL_CAPSULE | Freq: Every day | ORAL | 0 refills | Status: DC

## 2018-06-14 DIAGNOSIS — R69 Illness, unspecified: Secondary | ICD-10-CM | POA: Diagnosis not present

## 2018-06-20 NOTE — Progress Notes (Signed)
Subjective:   [name] was seen in consultation in the movement disorder clinic at the request of [name], Malka So, MD.  This patient is accompanied in the office by his spouse who supplements the history.  The evaluation is for tremor.  Tremor started approximately 6 months ago and involves the bilateral UE.  Tremor is most noticeable when he goes to do something with fine motor coordination.   There is no family hx of tremor.  Patient worried about the development of Parkinson's disease, as he had a friend who had Parkinson's disease.  Affected by caffeine:  No. (2 cups per day) Affected by alcohol:  Doesn't know (drinks 24 oz of wine per night - "helps me get to sleep") Affected by stress:  More anxious than in the past but doesn't affect tremor Affected by fatigue:  No. Spills soup if on spoon:  Yes.  , it can Spills glass of liquid if full:  Yes.  , sometimes Affects ADL's (tying shoes, brushing teeth, etc):  No.  Current/Previously tried tremor medications: xanax (takes 1 time per month for sleep); gabapentin: 900 mg tid for muscle cramping at night  Current medications that may exacerbate tremor:  Albuterol/ combivent (now off of these meds)  Outside reports reviewed: historical medical records, lab reports, office notes and referral letter/letters.  09/21/17 update: Patient is seen today in follow-up for tremor and cramping.  He is accompanied by his wife who supplements the history.  He was given a prescription for baclofen at last visit.  He reports that didn't help and he d/c it.  He tried tonic water without relief.  has been on quinine in the past.  "I have had nighttime cramping all of my life."  Reports some day cramping as well that has become bothersome.  Reports tremor is a little worse as well.  Notices it most when he does something requiring fine motor coordination, such as changing the battery in his hearing aid.  Records have been reviewed since our last visit.  Has  recently seen his primary care physician as his COPD has not been improved with Symbicort and albuterol.  Pulmonary consultation was recommended.  06/21/2018 update: Patient is seen today in follow-up for lower extremity cramping.  Patient is accompanied by his wife who supplements the history.  Patient is on low-dose Dilantin for this, 100 mg twice per day.  Patient reports that it initially was helpful but now isn't as sure.  He still has "muscle spasms" and cramping of the hands and feet.  Fell in Jan.  Doesn't know how he fell.  fx a bone in his foot but no surgery needed  No Known Allergies  Outpatient Encounter Medications as of 06/21/2018  Medication Sig  . Albuterol Sulfate (PROAIR HFA IN) Inhale into the lungs as needed.   . budesonide-formoterol (SYMBICORT) 80-4.5 MCG/ACT inhaler Inhale 2 puffs into the lungs 2 (two) times daily.  . Cyanocobalamin (B-12) 1000 MCG TABS Take 1,000 mcg by mouth daily.  Marland Kitchen dexamethasone (DECADRON) 0.1 % ophthalmic suspension Place 1 drop into both eyes 2 (two) times daily as needed (ears).  . doxazosin (CARDURA) 4 MG tablet Take 1 tablet (4 mg total) by mouth daily.  . finasteride (PROSCAR) 5 MG tablet Take 1 tablet (5 mg total) by mouth daily.  . furosemide (LASIX) 40 MG tablet Take 0.5 tablets (20 mg total) by mouth 2 (two) times daily. You may take 1 extra lasix in the evening if you have  SOB, LEE, and weight gain > 3lbs in 24 hours  . glipiZIDE (GLUCOTROL) 5 MG tablet TAKE 1 TABLET BY MOUTH TWICE A DAY BEFORE A MEAL  . Glucose Blood (FREESTYLE LITE TEST VI)   . ONE TOUCH ULTRA TEST test strip USE TO CHECK BLOOD SUGAR DAILY  . ONETOUCH DELICA LANCETS 60F MISC USE TO CHECK BLOOD SUGAR DAILY AND PRN  . phenytoin (DILANTIN) 100 MG ER capsule Take 1 capsule (100 mg total) by mouth 2 (two) times daily.  Marland Kitchen Spacer/Aero-Holding Chambers (AEROCHAMBER PLUS) inhaler Use as instructed to use with inahaler.  . tamsulosin (FLOMAX) 0.4 MG CAPS capsule Take 1 capsule (0.4  mg total) by mouth daily after supper.  . tizanidine (ZANAFLEX) 2 MG capsule Take 3 capsules (6 mg total) by mouth at bedtime.  . Triamcinolone Acetonide (TRIAMCINOLONE 0.1 % CREAM : EUCERIN) CREA Apply 1 application topically 2 (two) times daily as needed for rash or itching.  . valsartan (DIOVAN) 40 MG tablet Take 1 tablet (40 mg total) by mouth daily.   Facility-Administered Encounter Medications as of 06/21/2018  Medication  . triamcinolone acetonide (KENALOG-40) injection 40 mg    Past Medical History:  Diagnosis Date  . BACK PAIN, CHRONIC 04/18/2009  . BENIGN PROSTATIC HYPERTROPHY, HX OF 01/19/2007  . Carpal tunnel syndrome 03/24/2007  . CORONARY ARTERY DISEASE 01/19/2007  . DEPRESSION, CHRONIC 06/24/2007  . DIABETES MELLITUS 01/19/2007  . DIABETES MELLITUS, TYPE II, WITH NEUROLOGICAL COMPLICATIONS 0/12/3233  . Diarrhea 02/23/2007  . HEART MURMUR, SYSTOLIC 5/73/2202  . HERPES SIMPLEX INFECTION 01/19/2007  . HYPERLIPIDEMIA 01/19/2007  . HYPERTENSION 01/19/2007  . Intermediate coronary syndrome ([place]) 08/29/2008  . LEG CRAMPS, NOCTURNAL 10/28/2007  . Leg cramps, sleep related 08/05/2010  . OBSTRUCTIVE SLEEP APNEA 01/19/2007   does not use CPAP  . OSTEOARTHRITIS 06/24/2007  . Other postprocedural status(V45.89) 01/19/2007  . PERCUTANEOUS TRANSLUMINAL CORONARY ANGIOPLASTY, HX OF 01/19/2007  . PLANTAR FASCIITIS, RIGHT 01/19/2007  . SHOULDER PAIN, LEFT 03/24/2007  . SPINAL STENOSIS, LUMBAR 06/03/2009  . TINEA PEDIS 11/15/2009  . URI 01/11/2009    Past Surgical History:  Procedure Laterality Date  . arthroscopy, knee of hx    . CARDIAC CATHETERIZATION    . CATARACT EXTRACTION Left   . COLONOSCOPY    . inguinal herniorrhapy, hx    . NASAL SINUS SURGERY    . percutaneous transluminal coronary angioplasty, hx of  2004   Dr. Lyndel Safe  . plastic joint in thumb    . PTCA/stent  2004  . TONSILLECTOMY    . VASECTOMY      Social History   Socioeconomic History  . Marital status: Married     Spouse name: Not on file  . Number of children: Not on file  . Years of education: Not on file  . Highest education level: Not on file  Occupational History  . Occupation: retired    Comment: Truck Research officer, political party  Social Needs  . Financial resource strain: Not on file  . Food insecurity:    Worry: Not on file    Inability: Not on file  . Transportation needs:    Medical: Not on file    Non-medical: Not on file  Tobacco Use  . Smoking status: Former Smoker    Last attempt to quit: 04/14/1983    Years since quitting: 35.2  . Smokeless tobacco: Never Used  . Tobacco comment: stopped 1985  Substance and Sexual Activity  . Alcohol use: Yes    Comment: 24 oz daily  wine  . Drug use: Not Currently    Types: Marijuana    Comment: occ  . Sexual activity: Not on file  Lifestyle  . Physical activity:    Days per week: Not on file    Minutes per session: Not on file  . Stress: Not on file  Relationships  . Social connections:    Talks on phone: Not on file    Gets together: Not on file    Attends religious service: Not on file    Active member of club or organization: Not on file    Attends meetings of clubs or organizations: Not on file    Relationship status: Not on file  . Intimate partner violence:    Fear of current or ex partner: Not on file    Emotionally abused: Not on file    Physically abused: Not on file    Forced sexual activity: Not on file  Other Topics Concern  . Not on file  Social History Narrative   Married in 1959 - 105yrs, divorced; married 1974 - 1.5 years, divorced; married 1985 1 daughter 88; 12 step daughters; 2 grandchildren.    Family Status  Relation Name Status  . Mother  Deceased at age 106       complications of rheum disease  . Father  Deceased at age 4  . Sister  Alive       rheumatism  . Daughter  Alive       '72  . Other family (Not Specified)    Review of Systems Review of Systems  Constitutional: Negative.   HENT: Negative.     Eyes: Negative.   Respiratory: Positive for shortness of breath (per wife, pt unsure).   Cardiovascular: Negative.   Musculoskeletal: Positive for joint pain (bilateral knees).  Skin: Negative.   Psychiatric/Behavioral: Negative.       Objective:   VITALS:   Vitals:   06/21/18 1308  BP: (!) 150/85  Pulse: (!) 56  Resp: 14  Weight: 174 lb (78.9 kg)  Height: 5\' 3"  (1.6 m)   GEN:  The patient appears stated age and is in NAD. HEENT:  Normocephalic, atraumatic.  The mucous membranes are moist. The superficial temporal arteries are without ropiness or tenderness. CV:  RRR with 3/6 SEM Lungs:  CTAB (some DOE) Neck/HEME:  There are no carotid bruits bilaterally.  Neurological examination:  Orientation: The patient is alert and oriented x3. Cranial nerves: There is good facial symmetry. The speech is fluent and clear. Soft palate rises symmetrically and there is no tongue deviation. Hearing is intact to conversational tone. Sensation: Sensation is intact to light touch throughout Motor: Strength is at least antigravity x4.  Movement examination: Tone: There is normal tone in the RUE.  There is normal tone in the LUE.  There is normal tone in the RLE.  There is normal tone in the LLE.  Abnormal movements: There is mild tremor of the outstretched hands. Coordination:  There is no decremation with RAM's, with any form of RAMS, including alternating supination and pronation of the forearm, hand opening and closing, finger taps, heel taps and toe taps. Gait and Station: The patient pushes off of the chair to arise.  He is slightly unstable even with a cane.   Labs:  Lab Results  Component Value Date   TSH 1.68 04/01/2017      Chemistry      Component Value Date/Time   NA 142 03/29/2018 1317   K  4.4 03/29/2018 1317   CL 105 03/29/2018 1317   CO2 30 03/29/2018 1317   BUN 15 03/29/2018 1317   CREATININE 0.61 03/29/2018 1317   CREATININE 0.81 01/15/2017 1555      Component  Value Date/Time   CALCIUM 9.3 03/29/2018 1317   ALKPHOS 81 03/29/2018 1317   AST 17 03/29/2018 1317   ALT 18 03/29/2018 1317   BILITOT 0.5 03/29/2018 1317     Lab Results  Component Value Date   VITAMINB12 770 12/17/2017   Lab Results  Component Value Date   HGBA1C 6.3 (A) 02/08/2018     Assessment/Plan:   1.  Tremor  -He decided to hold on medication today.  -Discussed complex relationship of tremor to alcohol.  Discussed the importance of slowly tapering and ultimately discontinuing alcohol.  2.   Peripheral neuropathy, likely due to diabetes  -Likely the etiology of gait/balance issues.  3.  LE cramping  -wonder if related to alcohol/dehydration at night.  He does not think so.  He has had lower extremity cramping for a long time.  It was better when he was using opioids, and came back when his opioids were discontinued because of concern about long-term opioid use.  -he tried zanaflex and baclofen without relief  -On low-dose Dilantin, 100 mg twice per day.  Initially thought that it was helpful but now not sure.  Wants to try something else.  Will taper and d/c.  -will try trileptal, 150 mg bid x 3 days and then increase to 300 mg bid.  Discussed risk of hyponatremia.  Risks, benefits, side effects and alternative therapies were discussed.  The opportunity to ask questions was given and they were answered to the best of my ability.  The patient expressed understanding and willingness to follow the outlined treatment protocols.    -check bmp in 2 weeks  5.  b12 deficiency  -better with supplementation.    6.  Follow up is anticipated in the next 6 months, sooner should new neurologic issues arise.  Much greater than 50% of this visit was spent in counseling and coordinating care.  Total face to face time:  25 min  CC:  [name], [name], MD

## 2018-06-21 ENCOUNTER — Encounter: Payer: Self-pay | Admitting: Neurology

## 2018-06-21 ENCOUNTER — Ambulatory Visit: Payer: Medicare HMO | Admitting: Neurology

## 2018-06-21 VITALS — BP 150/85 | HR 56 | Resp 14 | Ht 63.0 in | Wt 174.0 lb

## 2018-06-21 DIAGNOSIS — R252 Cramp and spasm: Secondary | ICD-10-CM

## 2018-06-21 DIAGNOSIS — R251 Tremor, unspecified: Secondary | ICD-10-CM | POA: Diagnosis not present

## 2018-06-21 DIAGNOSIS — Z5181 Encounter for therapeutic drug level monitoring: Secondary | ICD-10-CM | POA: Diagnosis not present

## 2018-06-21 NOTE — Patient Instructions (Addendum)
1.  Decrease dilantin - 100 mg once per day for a week and then stop the dilantin 2.  Start trileptal - 150 mg twice per day x 3 days and then increase to 300 mg twice per day 3.  In 10 days, you will get lab work done for me. Your provider has requested that you have labwork completed in 10 days. Please check into the lab from our office on 07/01/18. Then go to [facility] ([id number]) on the second floor of this building. If you are not called within 15 minutes please check with the front desk.

## 2018-06-22 MED ORDER — OXCARBAZEPINE 150 MG PO TABS: 150.0000 mg | ORAL_TABLET | Freq: Two times a day (BID) | ORAL | 0 refills | Status: DC

## 2018-06-22 MED ORDER — OXCARBAZEPINE 300 MG PO TABS: 300.0000 mg | ORAL_TABLET | Freq: Two times a day (BID) | ORAL | 1 refills | Status: DC

## 2018-06-27 ENCOUNTER — Encounter: Payer: Self-pay | Admitting: Family Medicine

## 2018-06-27 DIAGNOSIS — N401 Enlarged prostate with lower urinary tract symptoms: Secondary | ICD-10-CM

## 2018-06-27 DIAGNOSIS — R351 Nocturia: Principal | ICD-10-CM

## 2018-06-27 MED ORDER — FINASTERIDE 5 MG PO TABS: 5.0000 mg | ORAL_TABLET | Freq: Every day | ORAL | 0 refills | Status: DC

## 2018-07-02 ENCOUNTER — Encounter: Payer: Self-pay | Admitting: Family Medicine

## 2018-07-05 ENCOUNTER — Other Ambulatory Visit: Payer: Self-pay | Admitting: *Deleted

## 2018-07-05 DIAGNOSIS — N401 Enlarged prostate with lower urinary tract symptoms: Secondary | ICD-10-CM

## 2018-07-05 DIAGNOSIS — R351 Nocturia: Principal | ICD-10-CM

## 2018-07-05 MED ORDER — FINASTERIDE 5 MG PO TABS: 5.0000 mg | ORAL_TABLET | Freq: Every day | ORAL | 0 refills | Status: DC

## 2018-07-06 ENCOUNTER — Other Ambulatory Visit: Payer: Self-pay

## 2018-07-06 ENCOUNTER — Other Ambulatory Visit (INDEPENDENT_AMBULATORY_CARE_PROVIDER_SITE_OTHER): Payer: Medicare HMO

## 2018-07-06 DIAGNOSIS — Z5181 Encounter for therapeutic drug level monitoring: Secondary | ICD-10-CM | POA: Diagnosis not present

## 2018-07-06 DIAGNOSIS — R251 Tremor, unspecified: Secondary | ICD-10-CM | POA: Diagnosis not present

## 2018-07-06 DIAGNOSIS — E1140 Type 2 diabetes mellitus with diabetic neuropathy, unspecified: Secondary | ICD-10-CM | POA: Diagnosis not present

## 2018-07-06 LAB — BASIC METABOLIC PANEL
BUN: 14 mg/dL (ref 6–23)
CO2: 28 mEq/L (ref 19–32)
Calcium: 9 mg/dL (ref 8.4–10.5)
Chloride: 105 mEq/L (ref 96–112)
Creatinine, Ser: 0.64 mg/dL (ref 0.40–1.50)
GFR: 119.04 mL/min (ref >60.00)
GLUCOSE: 156 mg/dL — AB (ref 70–99)
Potassium: 4.7 mEq/L (ref 3.5–5.1)
Sodium: 142 mEq/L (ref 135–145)

## 2018-07-06 LAB — HEMOGLOBIN A1C: Hgb A1c MFr Bld: 6.5 % (ref 4.6–6.5)

## 2018-07-06 NOTE — Addendum Note (Signed)
Addended by: Gwynne Edinger on: 07/06/2018 12:11 PM   Modules accepted: Orders

## 2018-07-07 ENCOUNTER — Telehealth: Payer: Self-pay | Admitting: Neurology

## 2018-07-07 NOTE — Telephone Encounter (Signed)
Mychart message sent to patient.

## 2018-07-07 NOTE — Telephone Encounter (Signed)
-----   Message from [place], DO sent at 07/07/2018  8:08 AM EDT ----- I have reviewed all lab results which are normal or stable. Please inform the patient.

## 2018-07-10 ENCOUNTER — Encounter: Payer: Self-pay | Admitting: Family Medicine

## 2018-07-11 ENCOUNTER — Other Ambulatory Visit: Payer: Self-pay | Admitting: *Deleted

## 2018-07-11 DIAGNOSIS — R600 Localized edema: Secondary | ICD-10-CM

## 2018-07-11 DIAGNOSIS — R69 Illness, unspecified: Secondary | ICD-10-CM | POA: Diagnosis not present

## 2018-07-11 MED ORDER — FUROSEMIDE 40 MG PO TABS: 20.0000 mg | ORAL_TABLET | Freq: Two times a day (BID) | ORAL | 2 refills | Status: DC

## 2018-07-11 MED ORDER — ONETOUCH ULTRA BLUE VI STRP: ORAL_STRIP | 4 refills | Status: DC

## 2018-07-11 MED ORDER — ONETOUCH DELICA LANCETS 33G MISC: 4 refills | Status: DC

## 2018-07-13 ENCOUNTER — Encounter: Payer: Self-pay | Admitting: Family Medicine

## 2018-07-16 ENCOUNTER — Encounter: Payer: Self-pay | Admitting: Family Medicine

## 2018-07-18 ENCOUNTER — Other Ambulatory Visit: Payer: Self-pay | Admitting: *Deleted

## 2018-07-18 DIAGNOSIS — R600 Localized edema: Secondary | ICD-10-CM

## 2018-07-18 MED ORDER — FUROSEMIDE 40 MG PO TABS: 20.0000 mg | ORAL_TABLET | Freq: Two times a day (BID) | ORAL | 2 refills | Status: DC

## 2018-07-24 ENCOUNTER — Encounter: Payer: Self-pay | Admitting: Family Medicine

## 2018-07-25 ENCOUNTER — Other Ambulatory Visit: Payer: Self-pay | Admitting: *Deleted

## 2018-07-25 DIAGNOSIS — R600 Localized edema: Secondary | ICD-10-CM

## 2018-07-25 MED ORDER — OXCARBAZEPINE 300 MG PO TABS: 300.0000 mg | ORAL_TABLET | Freq: Two times a day (BID) | ORAL | 1 refills | Status: DC

## 2018-07-25 MED ORDER — FUROSEMIDE 40 MG PO TABS: 20.0000 mg | ORAL_TABLET | Freq: Two times a day (BID) | ORAL | 2 refills | Status: DC

## 2018-07-27 ENCOUNTER — Other Ambulatory Visit: Payer: Self-pay | Admitting: Family Medicine

## 2018-07-27 DIAGNOSIS — R600 Localized edema: Secondary | ICD-10-CM

## 2018-07-27 MED ORDER — FUROSEMIDE 40 MG PO TABS: 20.0000 mg | ORAL_TABLET | Freq: Two times a day (BID) | ORAL | 2 refills | Status: DC

## 2018-07-27 NOTE — Telephone Encounter (Signed)
Was sent to [facility] with the class NO PRINT, sent with NORMAL class to [facility] as requested.

## 2018-07-28 ENCOUNTER — Other Ambulatory Visit: Payer: Self-pay | Admitting: *Deleted

## 2018-07-28 DIAGNOSIS — R600 Localized edema: Secondary | ICD-10-CM

## 2018-07-28 MED ORDER — FUROSEMIDE 40 MG PO TABS: 20.0000 mg | ORAL_TABLET | Freq: Two times a day (BID) | ORAL | 2 refills | Status: DC

## 2018-08-12 DIAGNOSIS — N529 Male erectile dysfunction, unspecified: Secondary | ICD-10-CM | POA: Diagnosis not present

## 2018-08-12 DIAGNOSIS — I10 Essential (primary) hypertension: Secondary | ICD-10-CM | POA: Diagnosis not present

## 2018-08-12 DIAGNOSIS — M1990 Unspecified osteoarthritis, unspecified site: Secondary | ICD-10-CM | POA: Diagnosis not present

## 2018-08-12 DIAGNOSIS — E1142 Type 2 diabetes mellitus with diabetic polyneuropathy: Secondary | ICD-10-CM | POA: Diagnosis not present

## 2018-08-12 DIAGNOSIS — E1165 Type 2 diabetes mellitus with hyperglycemia: Secondary | ICD-10-CM | POA: Diagnosis not present

## 2018-08-12 DIAGNOSIS — E669 Obesity, unspecified: Secondary | ICD-10-CM | POA: Diagnosis not present

## 2018-08-12 DIAGNOSIS — N400 Enlarged prostate without lower urinary tract symptoms: Secondary | ICD-10-CM | POA: Diagnosis not present

## 2018-08-12 DIAGNOSIS — I2510 Atherosclerotic heart disease of native coronary artery without angina pectoris: Secondary | ICD-10-CM | POA: Diagnosis not present

## 2018-08-12 DIAGNOSIS — J449 Chronic obstructive pulmonary disease, unspecified: Secondary | ICD-10-CM | POA: Diagnosis not present

## 2018-08-12 DIAGNOSIS — G8929 Other chronic pain: Secondary | ICD-10-CM | POA: Diagnosis not present

## 2018-08-17 ENCOUNTER — Encounter: Payer: Self-pay | Admitting: Family Medicine

## 2018-08-18 ENCOUNTER — Other Ambulatory Visit: Payer: Self-pay | Admitting: *Deleted

## 2018-08-18 DIAGNOSIS — R252 Cramp and spasm: Secondary | ICD-10-CM

## 2018-08-18 MED ORDER — TIZANIDINE HCL 2 MG PO CAPS: 6.0000 mg | ORAL_CAPSULE | Freq: Every day | ORAL | 0 refills | Status: DC

## 2018-08-19 MED ORDER — OXCARBAZEPINE 600 MG PO TABS: 600.0000 mg | ORAL_TABLET | Freq: Every day | ORAL | 1 refills | Status: DC

## 2018-09-26 ENCOUNTER — Other Ambulatory Visit: Payer: Self-pay | Admitting: Family Medicine

## 2018-09-26 DIAGNOSIS — N401 Enlarged prostate with lower urinary tract symptoms: Secondary | ICD-10-CM

## 2018-09-27 ENCOUNTER — Encounter: Payer: Self-pay | Admitting: Family Medicine

## 2018-09-27 DIAGNOSIS — N401 Enlarged prostate with lower urinary tract symptoms: Secondary | ICD-10-CM

## 2018-09-28 MED ORDER — FINASTERIDE 5 MG PO TABS: 5.0000 mg | ORAL_TABLET | Freq: Every day | ORAL | 0 refills | Status: DC

## 2018-10-06 DIAGNOSIS — R69 Illness, unspecified: Secondary | ICD-10-CM | POA: Diagnosis not present

## 2018-10-23 ENCOUNTER — Encounter: Payer: Self-pay | Admitting: Family Medicine

## 2018-10-24 ENCOUNTER — Other Ambulatory Visit: Payer: Self-pay | Admitting: Family Medicine

## 2018-10-24 DIAGNOSIS — R600 Localized edema: Secondary | ICD-10-CM

## 2018-11-02 ENCOUNTER — Other Ambulatory Visit: Payer: Self-pay | Admitting: *Deleted

## 2018-11-02 ENCOUNTER — Encounter: Payer: Self-pay | Admitting: Family Medicine

## 2018-11-02 MED ORDER — METOPROLOL TARTRATE 25 MG PO TABS: 25.0000 mg | ORAL_TABLET | Freq: Every day | ORAL | 2 refills | Status: DC

## 2018-11-02 MED ORDER — TAMSULOSIN HCL 0.4 MG PO CAPS: 0.4000 mg | ORAL_CAPSULE | Freq: Every day | ORAL | 2 refills | Status: DC

## 2018-11-02 NOTE — Telephone Encounter (Signed)
Pt has been scheduled for a virtual appointment with Dr. [name] on 11/08/2018////ltd

## 2018-11-08 ENCOUNTER — Ambulatory Visit (INDEPENDENT_AMBULATORY_CARE_PROVIDER_SITE_OTHER): Payer: Medicare HMO | Admitting: Family Medicine

## 2018-11-08 ENCOUNTER — Encounter: Payer: Self-pay | Admitting: Family Medicine

## 2018-11-08 ENCOUNTER — Other Ambulatory Visit: Payer: Self-pay

## 2018-11-08 VITALS — BP 109/65 | HR 49

## 2018-11-08 DIAGNOSIS — I5032 Chronic diastolic (congestive) heart failure: Secondary | ICD-10-CM

## 2018-11-08 DIAGNOSIS — Z0000 Encounter for general adult medical examination without abnormal findings: Secondary | ICD-10-CM

## 2018-11-08 DIAGNOSIS — E782 Mixed hyperlipidemia: Secondary | ICD-10-CM

## 2018-11-08 DIAGNOSIS — E1149 Type 2 diabetes mellitus with other diabetic neurological complication: Secondary | ICD-10-CM

## 2018-11-08 DIAGNOSIS — R252 Cramp and spasm: Secondary | ICD-10-CM | POA: Diagnosis not present

## 2018-11-08 DIAGNOSIS — M159 Polyosteoarthritis, unspecified: Secondary | ICD-10-CM

## 2018-11-08 DIAGNOSIS — I119 Hypertensive heart disease without heart failure: Secondary | ICD-10-CM | POA: Diagnosis not present

## 2018-11-08 DIAGNOSIS — J449 Chronic obstructive pulmonary disease, unspecified: Secondary | ICD-10-CM | POA: Diagnosis not present

## 2018-11-08 DIAGNOSIS — F331 Major depressive disorder, recurrent, moderate: Secondary | ICD-10-CM

## 2018-11-08 DIAGNOSIS — R69 Illness, unspecified: Secondary | ICD-10-CM | POA: Diagnosis not present

## 2018-11-08 DIAGNOSIS — R001 Bradycardia, unspecified: Secondary | ICD-10-CM

## 2018-11-08 MED ORDER — TIZANIDINE HCL 2 MG PO CAPS: 6.0000 mg | ORAL_CAPSULE | Freq: Every day | ORAL | 1 refills | Status: DC

## 2018-11-08 NOTE — Assessment & Plan Note (Addendum)
Having pain and stiffness that affect daily activities. He is not interested in a motorized scooter to use for long walks,so he can join family. He has been on opioid medication and followed with pain management but treatments did not seem to help. Low impact exercise and fall precautions discussed.

## 2018-11-08 NOTE — Assessment & Plan Note (Signed)
HgA1C was at goal in 06/2018. No changes in current management, we discussed side effects of glipizide and the importance of taking med as prescribed. Regular exercise as tolerated and healthy diet with avoidance of added sugar food intake is an important part of treatment and recommended. Annual eye exam, periodic dental and foot care recommended. F/U in 4-5 months

## 2018-11-08 NOTE — Assessment & Plan Note (Addendum)
Aggravated by daily activities limitations due to his chronic medical conditions. Adjustment disorder. Refused pharmacologic treatment.

## 2018-11-08 NOTE — Progress Notes (Signed)
Virtual Visit via Video Note   I connected with [name] on 11/08/18 at 12:00 PM EDT by a video enabled telemedicine application and verified that I am speaking with the correct person using two identifiers.  Location patient: home Location provider:home office Persons participating in the virtual visit: patient, provider  I discussed the limitations of evaluation and management by telemedicine and the availability of in person appointments. The patient expressed understanding and agreed to proceed.   HPI: [name] is a 85 yo male seen today for chronic disease management and AWV. No new problems since his last OV on 04/19/18.  He lives with his wife.[name] He needs assistance for some ADL's and independent IADL's. Hx of generalized OA affecting mainly LE's.He has a cane to help with transfer.  Functional Status Survey: Is the patient deaf or have difficulty hearing?: Yes(Hearing aids.) Does the patient have difficulty seeing, even when wearing glasses/contacts?: No Does the patient have difficulty concentrating, remembering, or making decisions?: No Does the patient have difficulty walking or climbing stairs?: Yes Does the patient have difficulty dressing or bathing?: No Does the patient have difficulty doing errands alone such as visiting a doctor's office or shopping?: No  Fall Risk  11/08/2018 06/21/2018 09/21/2017 04/01/2017 03/25/2016  Falls in the past year? 1 1 No No No  Number falls in past yr: 0 0 - - -  Comment Right foot fracture. - - - -  Injury with Fall? 1 1 - - -  Risk for fall due to : Impaired balance/gait;Impaired mobility;Orthopedic patient - - - -  Follow up Education provided - - - -   Providers he sees regularly: Eye care provider: Dr Sabra Heck, Hx of glaucoma.Last OV 6 months ago. Dr Tat,neurologist for tremor and muscle cramps.  Refused palliative care.  Depression screen PHQ 2/9 11/08/2018  Decreased Interest 2  Down, Depressed, Hopeless 2  PHQ - 2 Score 4   Altered sleeping 2  Tired, decreased energy 3  Change in appetite 2  Feeling bad or failure about yourself  3  Trouble concentrating 2  Moving slowly or fidgety/restless 0  Suicidal thoughts 0  PHQ-9 Score 16  Difficult doing work/chores Somewhat difficult  Some recent data might be hidden   Hx of depression and fatigue,he tried SSRI last year,not compliant,discontinued because did not feel like med help. He attributes depressed mood to the fact he has limitations in regard to mobility due to OA and hearing loss. Negative for suicidal thoughts. + Sleep interrupted due to cramps,chronic.  He drinks alcohol daily,he does not specify amount.  Mini-Cog - 11/08/18 1257    Normal clock drawing test?  yes    How many words correct?  3      He enjoys reading and visiting grandchildren.   Hearing Screening   125Hz  250Hz  500Hz  1000Hz  2000Hz  3000Hz  4000Hz  6000Hz  8000Hz   Right ear:           Left ear:           Vision Screening Comments: Virtual visit.  DM II,he is on Glipizide 5 mg bid before meals.Sometimes he forgot to take medication,asking if he can take med after meals. Negative for hypoglycemic events. BS's 160's-200. Denies abdominal pain, nausea,vomiting, polydipsia,polyuria, or polyphagia.   Lab Results  Component Value Date   HGBA1C 6.5 07/06/2018    Lab Results  Component Value Date   CHOL 213 (H) 07/21/2017   HDL 40.60 07/21/2017   LDLCALC 50 03/14/2012   LDLDIRECT 121.0 07/21/2017  TRIG 287.0 (H) 07/21/2017   CHOLHDL 5 07/21/2017   HTN, He is on Metoprolol Tartrate 25 mg daily and Diovan 40 mg daily.  He also takes Furosemide 40 mg ,1/2 to 1 tab bid. He takes diuretic as needed because exacerbated urinary frequency and urgency.  LE edema stable. Diastolic dysfunction. Last echo 08/31/2017: LVEF 60-65% and grade 2 diastolic dysfunction.Mild AR and MR. Mildly increased pulmonary arteries pressure.  Hx of OSA,he has not worn CPAP. He denies orthopnea or  PND.   Lab Results  Component Value Date   CREATININE 0.64 07/06/2018   BUN 14 07/06/2018   NA 142 07/06/2018   K 4.7 07/06/2018   CL 105 07/06/2018   CO2 28 07/06/2018   Dizziness when standing ,unbalance,no spinning. It does "not last long." Problem has been going on for "a while" and stable otherwise. Negative for associated palpitation,CP,dyspnea,or diaphoresis.  COPD,he is not using Symbicort. Denies fever,chills,sore throat,cough,dyspnea,of wheezing. Former smoker. He has not used Albuterol inh in moths.  ROS: See pertinent positives and negatives per HPI.  Past Medical History:  Diagnosis Date  . BACK PAIN, CHRONIC 04/18/2009  . BENIGN PROSTATIC HYPERTROPHY, HX OF 01/19/2007  . Carpal tunnel syndrome 03/24/2007  . CORONARY ARTERY DISEASE 01/19/2007  . DEPRESSION, CHRONIC 06/24/2007  . DIABETES MELLITUS 01/19/2007  . DIABETES MELLITUS, TYPE II, WITH NEUROLOGICAL COMPLICATIONS 0/04/270  . Diarrhea 02/23/2007  . HEART MURMUR, SYSTOLIC 5/36/6440  . HERPES SIMPLEX INFECTION 01/19/2007  . HYPERLIPIDEMIA 01/19/2007  . HYPERTENSION 01/19/2007  . Intermediate coronary syndrome ([place]) 08/29/2008  . LEG CRAMPS, NOCTURNAL 10/28/2007  . Leg cramps, sleep related 08/05/2010  . OBSTRUCTIVE SLEEP APNEA 01/19/2007   does not use CPAP  . OSTEOARTHRITIS 06/24/2007  . Other postprocedural status(V45.89) 01/19/2007  . PERCUTANEOUS TRANSLUMINAL CORONARY ANGIOPLASTY, HX OF 01/19/2007  . PLANTAR FASCIITIS, RIGHT 01/19/2007  . SHOULDER PAIN, LEFT 03/24/2007  . SPINAL STENOSIS, LUMBAR 06/03/2009  . TINEA PEDIS 11/15/2009  . URI 01/11/2009    Past Surgical History:  Procedure Laterality Date  . arthroscopy, knee of hx    . CARDIAC CATHETERIZATION    . CATARACT EXTRACTION Left   . COLONOSCOPY    . inguinal herniorrhapy, hx    . NASAL SINUS SURGERY    . percutaneous transluminal coronary angioplasty, hx of  2004   Dr. Lyndel Safe  . plastic joint in thumb    . PTCA/stent  2004  . TONSILLECTOMY    .  VASECTOMY      Family History  Problem Relation Age of Onset  . Arthritis Sister   . Diabetes Other   . Coronary artery disease Other     Social History   Socioeconomic History  . Marital status: Married    Spouse name: Not on file  . Number of children: Not on file  . Years of education: Not on file  . Highest education level: Not on file  Occupational History  . Occupation: retired    Comment: Truck Research officer, political party  Social Needs  . Financial resource strain: Not on file  . Food insecurity    Worry: Not on file    Inability: Not on file  . Transportation needs    Medical: Not on file    Non-medical: Not on file  Tobacco Use  . Smoking status: Former Smoker    Quit date: 04/14/1983    Years since quitting: 35.5  . Smokeless tobacco: Never Used  . Tobacco comment: stopped 1985  Substance and Sexual Activity  . Alcohol  use: Yes    Comment: 24 oz daily wine  . Drug use: Not Currently    Types: Marijuana    Comment: occ  . Sexual activity: Not on file  Lifestyle  . Physical activity    Days per week: Not on file    Minutes per session: Not on file  . Stress: Not on file  Relationships  . Social Herbalist on phone: Not on file    Gets together: Not on file    Attends religious service: Not on file    Active member of club or organization: Not on file    Attends meetings of clubs or organizations: Not on file    Relationship status: Not on file  . Intimate partner violence    Fear of current or ex partner: Not on file    Emotionally abused: Not on file    Physically abused: Not on file    Forced sexual activity: Not on file  Other Topics Concern  . Not on file  Social History Narrative   Married in 1959 - 89yrs, divorced; married 1974 - 1.5 years, divorced; married 1985 1 daughter 21; 81 step daughters; 2 grandchildren.     Current Outpatient Medications:  .  Albuterol Sulfate (PROAIR HFA IN), Inhale into the lungs as needed. , Disp: , Rfl:  .   Cyanocobalamin (B-12) 1000 MCG TABS, Take 1,000 mcg by mouth daily., Disp: , Rfl:  .  dexamethasone (DECADRON) 0.1 % ophthalmic suspension, Place 1 drop into both eyes 2 (two) times daily as needed (ears)., Disp: 15 mL, Rfl: 2 .  finasteride (PROSCAR) 5 MG tablet, Take 1 tablet (5 mg total) by mouth daily., Disp: 90 tablet, Rfl: 0 .  furosemide (LASIX) 40 MG tablet, TAKE 0.5 TABLET BY MOUTH 2 TIMES DAILY. YOU MAY TAKE 1 EXTRA IN THE EVENING IF YOU HAVE SOB, LEE, AND WEIGHT GAIN >3LBS IN 24 HOURS, Disp: 270 tablet, Rfl: 1 .  glipiZIDE (GLUCOTROL) 5 MG tablet, TAKE 1 TABLET BY MOUTH TWICE A DAY BEFORE A MEAL, Disp: 180 tablet, Rfl: 1 .  metoprolol tartrate (LOPRESSOR) 25 MG tablet, Take 1 tablet (25 mg total) by mouth daily., Disp: 90 tablet, Rfl: 2 .  ONE TOUCH ULTRA TEST test strip, USE TO CHECK BLOOD SUGAR DAILY, Disp: 100 each, Rfl: 4 .  OneTouch Delica Lancets 55H MISC, USE TO CHECK BLOOD SUGAR DAILY AND PRN, Disp: 100 each, Rfl: 4 .  oxcarbazepine (TRILEPTAL) 600 MG tablet, Take 1 tablet (600 mg total) by mouth daily., Disp: 90 tablet, Rfl: 1 .  phenytoin (DILANTIN) 100 MG ER capsule, Take 1 capsule (100 mg total) by mouth 2 (two) times daily., Disp: 180 capsule, Rfl: 0 .  Spacer/Aero-Holding Chambers (AEROCHAMBER PLUS) inhaler, Use as instructed to use with inahaler., Disp: 1 each, Rfl: 1 .  tamsulosin (FLOMAX) 0.4 MG CAPS capsule, Take 1 capsule (0.4 mg total) by mouth daily after supper., Disp: 90 capsule, Rfl: 2 .  tizanidine (ZANAFLEX) 2 MG capsule, Take 3 capsules (6 mg total) by mouth at bedtime., Disp: 270 capsule, Rfl: 1 .  Triamcinolone Acetonide (TRIAMCINOLONE 0.1 % CREAM : EUCERIN) CREA, Apply 1 application topically 2 (two) times daily as needed for rash or itching., Disp: 500 each, Rfl: 1 .  valsartan (DIOVAN) 40 MG tablet, Take 1 tablet (40 mg total) by mouth daily., Disp: 90 tablet, Rfl: 1  Current Facility-Administered Medications:  .  triamcinolone acetonide (KENALOG-40)  injection 40 mg, 40 mg, Intramuscular,  Once, [name], [name], MD  EXAMTonette Bihari per patient if applicable:BP 671/24   Pulse (!) 49   GENERAL: alert, oriented, appears well and in no acute distress  HEENT: atraumatic, conjunctiva clear, no obvious facial abnormalities on inspection.  NECK: normal movements of the head and neck  LUNGS: on inspection no signs of respiratory distress, breathing rate appears normal, no obvious gross SOB, gasping or wheezing  CV: no obvious cyanosis  MS: No signs of synovitis.  PSYCH/NEURO: pleasant and cooperative, no obvious depression or anxiety, speech and thought processing grossly intact  ASSESSMENT AND PLAN:  Discussed the following assessment and plan:  He does not want lab appt.  Medicare annual wellness visit, subsequent - Plan: We discussed the importance of staying active, physically (as tolerated) and mentally, as well as the benefits of a healthy/balance diet. Low impact exercise that involve stretching and strengthing are ideal. Vaccines up to date. We discussed preventive screening needed for the next 5-10 years Advance directives and end of life discussed, he has both.    Health Maintenance  Topic Date Due  . Flu Shot  11/12/2018  . Eye exam for diabetics  12/13/2018  . Hemoglobin A1C  01/06/2019  . Complete foot exam   02/09/2019  . Tetanus Vaccine  04/05/2019  . Pneumonia vaccines  Discontinued     Diabetes mellitus type 2 with neurological manifestations ([place]) HgA1C was at goal in 06/2018. No changes in current management, we discussed side effects of glipizide and the importance of taking med as prescribed. Regular exercise as tolerated and healthy diet with avoidance of added sugar food intake is an important part of treatment and recommended. Annual eye exam, periodic dental and foot care recommended. F/U in 4-5 months   Generalized osteoarthritis of multiple sites Having pain and stiffness that affect daily  activities. He is not interested in a motorized scooter to use for long walks,so he can join family. He has been on opioid medication and followed with pain management but treatments did not seem to help. Low impact exercise and fall precautions discussed.  Sinus bradycardia This problem could be contributing to fatigue. Metoprolol tartrate to be weaned off, decreased from 25 mg daily to 12.5 mg daily x 7 d and then q 2 days x 7  Days. Continue monitoring HR. Instructed about warning signs.   Depression, major, recurrent, moderate ([place]) Aggravated by daily activities limitations due to his chronic medical conditions. Adjustment disorder. Refused pharmacologic treatment.  Hyperlipidemia, mixed He has not tolerated statin medications,aggravate myalgias. Continue low fat diet. He prefers to hold on lab work,does not feel comfortable coming to the office due to [place] 19 pandemia.  Cramp of both lower extremities He reports some improvement with Zanaflex but states that he is not sure. Zanaflex 6 mg tid as needed to resume. Instructed to monitor for changes in cramps frequency and/or intensity. If there is not a clear benefits medication will be discontinued. Side effects discussed.  COPD (chronic obstructive pulmonary disease) ([place]) He has not had symptoms in a few months. Continue Albuterol inh 2 puff q 4-6 hours as needed. Instructed about warning signs.  Diastolic CHF ([place]) Asymptomatic. Metoprolol discontinued due to bradycardia. On Diovan and Furosemide.  Last echo 08/31/2017: LVEF 60-65% and grade 2 diastolic dysfunction.Mild AR and MR. Mildly increased pulmonary arteries pressure.   Hypertension with heart disease Today BP mildly low. Because bradycardia ,Metoprolol was discontinued. Continue Valsartan 40 mg daily. Low salt diet. Monitor BP daily. If needed  we can add Amlodipine. He will let me know about BP readings in 3-4 weeks.   45 min video visit.  All  medications reviewed and side effects discussed.  I discussed the assessment and treatment plan with the patient. He was provided an opportunity to ask questions and all were answered. He agreed with the plan and demonstrated an understanding of the instructions.     Return in about 4 months (around 03/11/2019) for DM II,HTN.    [name], MD

## 2018-11-08 NOTE — Assessment & Plan Note (Signed)
He has not tolerated statin medications,aggravate myalgias. Continue low fat diet. He prefers to hold on lab work,does not feel comfortable coming to the office due to [place] 19 pandemia.

## 2018-11-08 NOTE — Assessment & Plan Note (Signed)
He reports some improvement with Zanaflex but states that he is not sure. Zanaflex 6 mg tid as needed to resume. Instructed to monitor for changes in cramps frequency and/or intensity. If there is not a clear benefits medication will be discontinued. Side effects discussed.

## 2018-11-08 NOTE — Assessment & Plan Note (Signed)
He has not had symptoms in a few months. Continue Albuterol inh 2 puff q 4-6 hours as needed. Instructed about warning signs.

## 2018-11-08 NOTE — Assessment & Plan Note (Signed)
Asymptomatic. Metoprolol discontinued due to bradycardia. On Diovan and Furosemide.  Last echo 08/31/2017: LVEF 60-65% and grade 2 diastolic dysfunction.Mild AR and MR. Mildly increased pulmonary arteries pressure.

## 2018-11-08 NOTE — Assessment & Plan Note (Signed)
This problem could be contributing to fatigue. Metoprolol tartrate to be weaned off, decreased from 25 mg daily to 12.5 mg daily x 7 d and then q 2 days x 7  Days. Continue monitoring HR. Instructed about warning signs.

## 2018-11-08 NOTE — Assessment & Plan Note (Signed)
Today BP mildly low. Because bradycardia ,Metoprolol was discontinued. Continue Valsartan 40 mg daily. Low salt diet. Monitor BP daily. If needed we can add Amlodipine. He will let me know about BP readings in 3-4 weeks.

## 2018-11-21 ENCOUNTER — Encounter: Payer: Self-pay | Admitting: Family Medicine

## 2018-11-21 ENCOUNTER — Other Ambulatory Visit: Payer: Self-pay | Admitting: *Deleted

## 2018-11-21 DIAGNOSIS — E1140 Type 2 diabetes mellitus with diabetic neuropathy, unspecified: Secondary | ICD-10-CM

## 2018-11-21 MED ORDER — GLIPIZIDE 5 MG PO TABS: 5.0000 mg | ORAL_TABLET | Freq: Two times a day (BID) | ORAL | 0 refills | Status: DC

## 2018-12-22 ENCOUNTER — Other Ambulatory Visit: Payer: Self-pay | Admitting: Family Medicine

## 2018-12-22 DIAGNOSIS — N401 Enlarged prostate with lower urinary tract symptoms: Secondary | ICD-10-CM

## 2018-12-24 ENCOUNTER — Encounter: Payer: Self-pay | Admitting: Family Medicine

## 2018-12-27 ENCOUNTER — Other Ambulatory Visit: Payer: Self-pay | Admitting: Family Medicine

## 2018-12-27 DIAGNOSIS — E1140 Type 2 diabetes mellitus with diabetic neuropathy, unspecified: Secondary | ICD-10-CM

## 2019-01-23 DIAGNOSIS — R69 Illness, unspecified: Secondary | ICD-10-CM | POA: Diagnosis not present

## 2019-01-24 ENCOUNTER — Other Ambulatory Visit: Payer: Self-pay | Admitting: Family Medicine

## 2019-01-24 DIAGNOSIS — I119 Hypertensive heart disease without heart failure: Secondary | ICD-10-CM

## 2019-01-30 ENCOUNTER — Encounter: Payer: Self-pay | Admitting: Family Medicine

## 2019-01-30 ENCOUNTER — Other Ambulatory Visit: Payer: Self-pay

## 2019-01-30 ENCOUNTER — Ambulatory Visit (INDEPENDENT_AMBULATORY_CARE_PROVIDER_SITE_OTHER): Payer: Medicare HMO | Admitting: Family Medicine

## 2019-01-30 VITALS — BP 120/70 | HR 96 | Temp 97.0°F | Resp 20 | Ht 63.0 in | Wt 173.0 lb

## 2019-01-30 DIAGNOSIS — E1140 Type 2 diabetes mellitus with diabetic neuropathy, unspecified: Secondary | ICD-10-CM | POA: Diagnosis not present

## 2019-01-30 DIAGNOSIS — I119 Hypertensive heart disease without heart failure: Secondary | ICD-10-CM | POA: Diagnosis not present

## 2019-01-30 DIAGNOSIS — R519 Headache, unspecified: Secondary | ICD-10-CM

## 2019-01-30 DIAGNOSIS — R600 Localized edema: Secondary | ICD-10-CM | POA: Diagnosis not present

## 2019-01-30 DIAGNOSIS — R252 Cramp and spasm: Secondary | ICD-10-CM | POA: Diagnosis not present

## 2019-01-30 DIAGNOSIS — E782 Mixed hyperlipidemia: Secondary | ICD-10-CM | POA: Diagnosis not present

## 2019-01-30 DIAGNOSIS — Z23 Encounter for immunization: Secondary | ICD-10-CM | POA: Diagnosis not present

## 2019-01-30 LAB — MICROALBUMIN / CREATININE URINE RATIO
Creatinine,U: 88.6 mg/dL
Microalb Creat Ratio: 2.6 mg/g (ref 0.0–30.0)
Microalb, Ur: 2.3 mg/dL — ABNORMAL HIGH (ref 0.0–1.9)

## 2019-01-30 LAB — BASIC METABOLIC PANEL
BUN: 15 mg/dL (ref 6–23)
CO2: 26 mEq/L (ref 19–32)
Calcium: 9.2 mg/dL (ref 8.4–10.5)
Chloride: 106 mEq/L (ref 96–112)
Creatinine, Ser: 0.67 mg/dL (ref 0.40–1.50)
GFR: 112.75 mL/min (ref >60.00)
Glucose, Bld: 157 mg/dL — ABNORMAL HIGH (ref 70–99)
Potassium: 4.1 mEq/L (ref 3.5–5.1)
Sodium: 142 mEq/L (ref 135–145)

## 2019-01-30 LAB — LIPID PANEL
Cholesterol: 206 mg/dL — ABNORMAL HIGH (ref 0–200)
HDL: 44 mg/dL (ref >39.00)
LDL Cholesterol: 124 mg/dL — ABNORMAL HIGH (ref 0–99)
NonHDL: 161.67
Total CHOL/HDL Ratio: 5
Triglycerides: 190 mg/dL — ABNORMAL HIGH (ref 0.0–149.0)
VLDL: 38 mg/dL (ref 0.0–40.0)

## 2019-01-30 LAB — HEMOGLOBIN A1C: Hgb A1c MFr Bld: 6.4 % (ref 4.6–6.5)

## 2019-01-30 MED ORDER — ROPINIROLE HCL 0.5 MG PO TABS: 0.5000 mg | ORAL_TABLET | Freq: Every day | ORAL | 0 refills | Status: DC

## 2019-01-30 NOTE — Patient Instructions (Addendum)
A few things to remember from today's visit:   Type 2 diabetes mellitus with diabetic neuropathy, without long-term current use of insulin ([place]) - Plan: Fructosamine, Hemoglobin A1c, Microalbumin / creatinine urine ratio, Basic metabolic panel  Hypertension with heart disease - Plan: Basic metabolic panel  Hyperlipidemia, mixed - Plan: Lipid panel  Headache, occipital  Cramp of limb  Try Requip at bedtime to see if this help with leg cramps. BP 160/80. You need to arrange eye exam. Start losartan 25 mg daily. Continue monitoring blood pressure.  Please be sure medication list is accurate. If a new problem present, please set up appointment sooner than planned today.

## 2019-01-30 NOTE — Assessment & Plan Note (Signed)
HgA1C has been at goal. For now no changes in current management, we discussed side effects of glipizide. Regular exercise as tolerated and healthy diet with avoidance of added sugar food intake is an important part of treatment and recommended. Annual eye exam (he is overdue), periodic dental and foot care recommended. F/U in 5-6 months

## 2019-01-30 NOTE — Assessment & Plan Note (Signed)
This problem is chronic. He has tried different medications unsuccessfully. Zanaflex and Trileptal helping some. He agrees with trial of Requip 0.5 mg at bedtime for 2 to 3 weeks, do not continue if medication does not help. Adequate hydration. Following with neurologist.

## 2019-01-30 NOTE — Assessment & Plan Note (Signed)
Re-checked 160/80. He has prescription for losartan 25 mg that he never started, recommend starting medication daily at night. We discussed possible complications of elevated BP. Low-salt diet also recommended. Continue monitoring BP regularly.

## 2019-01-30 NOTE — Progress Notes (Signed)
HPI:   [name] is a 85 y.o. male, who is here today with his wife for chronic disease management.  He was last seen on 11/08/2018. DM II: He is concerned about elevated BS,175-200's. Currently he is on glipizide 5 mg twice daily. He denies episodes of hypoglycemia.  Denies abdominal pain, nausea,vomiting, polydipsia,polyuria, or polyphagia.   Lab Results  Component Value Date   HGBA1C 6.5 07/06/2018   Lab Results  Component Value Date   MICROALBUR 0.7 07/03/2016   Hypertension: He is continue valsartan 20 mg. He was having some dizziness when standing up. Home BP readings: 140s/70s.  He denies sudden visual changes, chest pain, dyspnea, palpitations, orthopnea, PND, or worsening edema.  He is not taking furosemide as recommended, it makes urinary frequency worse. Lower extremity edema is better when he first gets up in the morning and worse at the end of the day. He denies erythema.  Lab Results  Component Value Date   CREATININE 0.64 07/06/2018   BUN 14 07/06/2018   NA 142 07/06/2018   K 4.7 07/06/2018   CL 105 07/06/2018   CO2 28 07/06/2018   Hyperlipidemia: Currently he is not on statin medication, he has not tolerated them in the past. He has not been consistent with following low-fat diet.  Lab Results  Component Value Date   CHOL 213 (H) 07/21/2017   HDL 40.60 07/21/2017   LDLCALC 50 03/14/2012   LDLDIRECT 121.0 07/21/2017   TRIG 287.0 (H) 07/21/2017   CHOLHDL 5 07/21/2017   Complaining about persistent lower extremity cramps, most frequent at night. Interferes with sleep,wakes him up in the middle of the night, he has to get up and walk for a few minutes. During the day he does not have problems, even if he takes naps. Currently he is following with neurologist, he was started on Trileptal 600 mg twice daily, which has helped some. He is also on Zanaflex 2 mg capsule, he takes 3 capsules at bedtime.  He denies side effects.  +  Fatigue. OSA, he did not tolerate CPAP mask x 2.  His wife mentions 3 to 4 weeks of occipital dull headache, it is "not bad", and almost daily. Pain seems to be coming from neck. No hx of trauma. Negative for MS changes.  He denies associated visual changes, photophobia, nausea, vomiting, or neurologic deficit. He is taking ibuprofen 200 mg x 2.  Review of Systems  Constitutional: Positive for fatigue. Negative for activity change, appetite change and fever.  HENT: Negative for mouth sores, nosebleeds and sore throat.   Eyes: Negative for pain and redness.  Respiratory: Positive for apnea. Negative for cough and wheezing.   Cardiovascular: Positive for leg swelling.  Gastrointestinal: Negative for abdominal distention.  Genitourinary: Negative for decreased urine volume, dysuria and hematuria.  Musculoskeletal: Positive for arthralgias, back pain and gait problem.  Skin: Negative for rash and wound.  Neurological: Positive for headaches. Negative for syncope.  Psychiatric/Behavioral: Positive for sleep disturbance. Negative for confusion. The patient is nervous/anxious.   Rest see pertinent positives and negatives per HPI.   Current Outpatient Medications on File Prior to Visit  Medication Sig Dispense Refill  . Albuterol Sulfate (PROAIR HFA IN) Inhale into the lungs as needed.     . Cyanocobalamin (B-12) 1000 MCG TABS Take 1,000 mcg by mouth daily.    Marland Kitchen dexamethasone (DECADRON) 0.1 % ophthalmic suspension Place 1 drop into both eyes 2 (two) times daily as needed (ears).  15 mL 2  . doxazosin (CARDURA) 4 MG tablet Take 4 mg by mouth daily.    . finasteride (PROSCAR) 5 MG tablet TAKE 1 TABLET BY MOUTH EVERY DAY 90 tablet 0  . furosemide (LASIX) 40 MG tablet TAKE 0.5 TABLET BY MOUTH 2 TIMES DAILY. YOU MAY TAKE 1 EXTRA IN THE EVENING IF YOU HAVE SOB, LEE, AND WEIGHT GAIN >3LBS IN 24 HOURS 270 tablet 1  . glipiZIDE (GLUCOTROL) 5 MG tablet Take 1 tablet (5 mg total) by mouth 2 (two) times  daily. TAKE 1 TABLET BY MOUTH TWICE A DAY BEFORE A MEAL 180 tablet 0  . metoprolol tartrate (LOPRESSOR) 25 MG tablet Take 1 tablet (25 mg total) by mouth daily. 90 tablet 2  . ONE TOUCH ULTRA TEST test strip USE TO CHECK BLOOD SUGAR DAILY 100 each 4  . OneTouch Delica Lancets 99991111 MISC USE TO CHECK BLOOD SUGAR DAILY AND PRN 100 each 4  . oxcarbazepine (TRILEPTAL) 600 MG tablet Take 1 tablet (600 mg total) by mouth daily. 90 tablet 1  . Spacer/Aero-Holding Chambers (AEROCHAMBER PLUS) inhaler Use as instructed to use with inahaler. 1 each 1  . tizanidine (ZANAFLEX) 2 MG capsule Take 3 capsules (6 mg total) by mouth at bedtime. 270 capsule 1  . Triamcinolone Acetonide (TRIAMCINOLONE 0.1 % CREAM : EUCERIN) CREA Apply 1 application topically 2 (two) times daily as needed for rash or itching. 500 each 1   Current Facility-Administered Medications on File Prior to Visit  Medication Dose Route Frequency Provider Last Rate Last Dose  . triamcinolone acetonide (KENALOG-40) injection 40 mg  40 mg Intramuscular Once [name], Halil Rentz G, MD         Past Medical History:  Diagnosis Date  . BACK PAIN, CHRONIC 04/18/2009  . BENIGN PROSTATIC HYPERTROPHY, HX OF 01/19/2007  . Carpal tunnel syndrome 03/24/2007  . CORONARY ARTERY DISEASE 01/19/2007  . DEPRESSION, CHRONIC 06/24/2007  . DIABETES MELLITUS 01/19/2007  . DIABETES MELLITUS, TYPE II, WITH NEUROLOGICAL COMPLICATIONS 99991111  . Diarrhea 02/23/2007  . HEART MURMUR, SYSTOLIC [id number]  . HERPES SIMPLEX INFECTION 01/19/2007  . HYPERLIPIDEMIA 01/19/2007  . HYPERTENSION 01/19/2007  . Intermediate coronary syndrome ([place]) 08/29/2008  . LEG CRAMPS, NOCTURNAL 10/28/2007  . Leg cramps, sleep related 08/05/2010  . OBSTRUCTIVE SLEEP APNEA 01/19/2007   does not use CPAP  . OSTEOARTHRITIS 06/24/2007  . Other postprocedural status(V45.89) 01/19/2007  . PERCUTANEOUS TRANSLUMINAL CORONARY ANGIOPLASTY, HX OF 01/19/2007  . PLANTAR FASCIITIS, RIGHT 01/19/2007  . SHOULDER PAIN, LEFT  03/24/2007  . SPINAL STENOSIS, LUMBAR 06/03/2009  . TINEA PEDIS 11/15/2009  . URI 01/11/2009   No Known Allergies  Social History   Socioeconomic History  . Marital status: Married    Spouse name: Not on file  . Number of children: Not on file  . Years of education: Not on file  . Highest education level: Not on file  Occupational History  . Occupation: retired    Comment: Truck Research officer, political party  Social Needs  . Financial resource strain: Not on file  . Food insecurity    Worry: Not on file    Inability: Not on file  . Transportation needs    Medical: Not on file    Non-medical: Not on file  Tobacco Use  . Smoking status: Former Smoker    Quit date: 04/14/1983    Years since quitting: 35.8  . Smokeless tobacco: Never Used  . Tobacco comment: stopped 1985  Substance and Sexual Activity  . Alcohol use: Yes  Comment: 24 oz daily wine  . Drug use: Not Currently    Types: Marijuana    Comment: occ  . Sexual activity: Not on file  Lifestyle  . Physical activity    Days per week: Not on file    Minutes per session: Not on file  . Stress: Not on file  Relationships  . Social Herbalist on phone: Not on file    Gets together: Not on file    Attends religious service: Not on file    Active member of club or organization: Not on file    Attends meetings of clubs or organizations: Not on file    Relationship status: Not on file  Other Topics Concern  . Not on file  Social History Narrative   Married in 1959 - 13yrs, divorced; married 1974 - 1.5 years, divorced; married 1985 1 daughter 80; 35 step daughters; 2 grandchildren.    Vitals:   01/30/19 1045  BP: 120/70  Pulse: 96  Resp: 20  Temp: (!) 97 F (36.1 C)  SpO2: 95%   Body mass index is 30.65 kg/m.  Physical Exam  Nursing note reviewed. Constitutional: He is oriented to person, place, and time. He appears well-developed. No distress.  HENT:  Head: Normocephalic and atraumatic.  Mouth/Throat:  Oropharynx is clear and moist and mucous membranes are normal.  Eyes: Pupils are equal, round, and reactive to light. Conjunctivae are normal.  Cardiovascular: Normal rate and regular rhythm.  Murmur (SEM I-II/VI LUSB, RUSB) heard. Pulses:      Dorsalis pedis pulses are 2+ on the right side and 2+ on the left side.  Respiratory: Effort normal and breath sounds normal. No respiratory distress.  GI: Soft. There is no abdominal tenderness.  Musculoskeletal:        General: Edema (2+ LE pitting edema,bilateral.) present.  Lymphadenopathy:    He has no cervical adenopathy.  Neurological: He is alert and oriented to person, place, and time. He has normal strength. No cranial nerve deficit.  Unstable gait, assisted with a cane.  Skin: Skin is warm. No rash noted. No erythema.  Psychiatric: His mood appears anxious.  Well groomed, good eye contact.    ASSESSMENT AND PLAN:  Mr. Dekker was seen today for follow-up.  Diagnoses and all orders for this visit:  Orders Placed This Encounter  Procedures  . Flu Vaccine QUAD High Dose(Fluad)  . Fructosamine  . Hemoglobin A1c  . Lipid panel  . Microalbumin / creatinine urine ratio  . Basic metabolic panel   Lab Results  Component Value Date   CREATININE 0.67 01/30/2019   BUN 15 01/30/2019   NA 142 01/30/2019   K 4.1 01/30/2019   CL 106 01/30/2019   CO2 26 01/30/2019   Lab Results  Component Value Date   HGBA1C 6.4 01/30/2019   Lab Results  Component Value Date   CHOL 206 (H) 01/30/2019   HDL 44.00 01/30/2019   LDLCALC 124 (H) 01/30/2019   LDLDIRECT 121.0 07/21/2017   TRIG 190.0 (H) 01/30/2019   CHOLHDL 5 01/30/2019   Lab Results  Component Value Date   MICROALBUR 2.3 (H) 01/30/2019    Headache, occipital ? Tension headache. Examination and Hx doe not suggest a serious process. Instructed about warning signs. Recommend addressing problem with his neurologist.  Need for immunization against influenza -     Flu Vaccine  QUAD High Dose(Fluad)   Type 2 diabetes mellitus with diabetic neuropathy, unspecified ([place]) HgA1C has been  at goal. For now no changes in current management, we discussed side effects of glipizide. Regular exercise as tolerated and healthy diet with avoidance of added sugar food intake is an important part of treatment and recommended. Annual eye exam (he is overdue), periodic dental and foot care recommended. F/U in 5-6 months   Bilateral lower extremity edema In general problem is stable. Recommend taking furosemide 20 mg daily as needed. Lower extremity elevation and compression stockings may also help. Appropriate skin care to prevent ulcers.  Hypertension with heart disease Re-checked 160/80. He has prescription for losartan 25 mg that he never started, recommend starting medication daily at night. We discussed possible complications of elevated BP. Low-salt diet also recommended. Continue monitoring BP regularly.  Cramp of limb This problem is chronic. He has tried different medications unsuccessfully. Zanaflex and Trileptal helping some. He agrees with trial of Requip 0.5 mg at bedtime for 2 to 3 weeks, do not continue if medication does not help. Adequate hydration. Following with neurologist.   Hyperlipidemia, mixed He has not tolerated statins in the past. Further recommendation will be given according to lipid panel results.   Return in about 6 months (around 07/31/2019).   Natilee Gauer G. [name], MD  [facility]. [place] office.

## 2019-01-30 NOTE — Assessment & Plan Note (Signed)
He has not tolerated statins in the past. Further recommendation will be given according to lipid panel results.

## 2019-01-30 NOTE — Assessment & Plan Note (Signed)
In general problem is stable. Recommend taking furosemide 20 mg daily as needed. Lower extremity elevation and compression stockings may also help. Appropriate skin care to prevent ulcers.

## 2019-02-02 ENCOUNTER — Encounter: Payer: Self-pay | Admitting: Family Medicine

## 2019-02-05 LAB — FRUCTOSAMINE: Fructosamine: 263 umol/L (ref 205–285)

## 2019-02-13 ENCOUNTER — Other Ambulatory Visit: Payer: Self-pay | Admitting: Family Medicine

## 2019-02-13 DIAGNOSIS — N401 Enlarged prostate with lower urinary tract symptoms: Secondary | ICD-10-CM

## 2019-02-13 DIAGNOSIS — E1140 Type 2 diabetes mellitus with diabetic neuropathy, unspecified: Secondary | ICD-10-CM

## 2019-02-20 ENCOUNTER — Other Ambulatory Visit: Payer: Self-pay | Admitting: Family Medicine

## 2019-02-20 ENCOUNTER — Encounter: Payer: Self-pay | Admitting: Family Medicine

## 2019-02-20 ENCOUNTER — Other Ambulatory Visit: Payer: Self-pay | Admitting: Neurology

## 2019-02-21 NOTE — Telephone Encounter (Signed)
Last visit 06/21/2018 ok to fill?

## 2019-02-23 ENCOUNTER — Telehealth: Payer: Self-pay | Admitting: Neurology

## 2019-02-23 ENCOUNTER — Other Ambulatory Visit: Payer: Self-pay | Admitting: Family Medicine

## 2019-02-23 MED ORDER — ROPINIROLE HCL 0.5 MG PO TABS: 0.5000 mg | ORAL_TABLET | Freq: Every day | ORAL | 1 refills | Status: DC

## 2019-02-23 NOTE — Telephone Encounter (Signed)
No refills until patient has an appointment

## 2019-02-23 NOTE — Telephone Encounter (Signed)
Okay I will call patient to schedule an appointment. Thank you

## 2019-02-23 NOTE — Telephone Encounter (Signed)
Santiago Glad called regarding patients Oxcarbazepine being denied and no refills. She faxed over a refill request. Thank you

## 2019-02-28 MED ORDER — OXCARBAZEPINE 600 MG PO TABS: 600.0000 mg | ORAL_TABLET | Freq: Every day | ORAL | 1 refills | Status: DC

## 2019-03-01 ENCOUNTER — Encounter: Payer: Self-pay | Admitting: Family Medicine

## 2019-03-01 MED ORDER — DOXAZOSIN MESYLATE 4 MG PO TABS: 4.0000 mg | ORAL_TABLET | Freq: Every day | ORAL | 0 refills | Status: DC

## 2019-03-21 ENCOUNTER — Encounter: Payer: Self-pay | Admitting: Neurology

## 2019-03-21 NOTE — Progress Notes (Signed)
Virtual Visit via Video Note The purpose of this virtual visit is to provide medical care while limiting exposure to the novel coronavirus.    Consent was obtained for video visit:  Yes.   Answered questions that patient had about telehealth interaction:  Yes.   I discussed the limitations, risks, security and privacy concerns of performing an evaluation and management service by telemedicine. I also discussed with the patient that there may be a patient responsible charge related to this service. The patient expressed understanding and agreed to proceed.  Pt location: Home Physician Location: office Name of referring provider:  [name], [name], MD I connected with Marcille Buffy at patients initiation/request on 03/22/2019 at 10:45 AM EST by video enabled telemedicine application and verified that I am speaking with the correct person using two identifiers. Pt MRN:  [id number] Pt DOB:  November 20, 1931 Video Participants:  Marcille Buffy;     History of Present Illness:  Patient seen today in follow-up for lower extremity cramping.  Dilantin was discontinued after our last visit.  He is on Trileptal, 300 mg twice per day (actually splitting 600 mg in half and taking 1/2 bid because it is cheaper).  He does think that the medication helps.  He is still on B12 supplementation for his B12 deficiency.  In regards to alcohol, states he is drinking "too much"  and isn't sure if cramping is related.  "at this point, I'm old and enjoy drinking maybe more than I hate cramping."  States that he has had cramping for over 20 years, so really does not think it is related to the alcohol, but admits that it could be and he does not drink much water.     Current Outpatient Medications on File Prior to Visit  Medication Sig Dispense Refill  . Albuterol Sulfate (PROAIR HFA IN) Inhale into the lungs as needed.     . Cyanocobalamin (B-12) 1000 MCG TABS Take 1,000 mcg by mouth daily.    Marland Kitchen dexamethasone (DECADRON) 0.1 %  ophthalmic suspension Place 1 drop into both eyes 2 (two) times daily as needed (ears). 15 mL 2  . doxazosin (CARDURA) 4 MG tablet Take 1 tablet (4 mg total) by mouth daily. 90 tablet 0  . finasteride (PROSCAR) 5 MG tablet TAKE 1 TABLET BY MOUTH EVERY DAY 90 tablet 0  . furosemide (LASIX) 40 MG tablet TAKE 0.5 TABLET BY MOUTH 2 TIMES DAILY. YOU MAY TAKE 1 EXTRA IN THE EVENING IF YOU HAVE SOB, LEE, AND WEIGHT GAIN >3LBS IN 24 HOURS 270 tablet 1  . glipiZIDE (GLUCOTROL) 5 MG tablet TAKE 1 TABLET BY MOUTH TWICE A DAY BEFORE A MEAL 180 tablet 0  . metoprolol tartrate (LOPRESSOR) 25 MG tablet Take 1 tablet (25 mg total) by mouth daily. 90 tablet 2  . ONE TOUCH ULTRA TEST test strip USE TO CHECK BLOOD SUGAR DAILY 100 each 4  . OneTouch Delica Lancets 99991111 MISC USE TO CHECK BLOOD SUGAR DAILY AND PRN 100 each 4  . oxcarbazepine (TRILEPTAL) 600 MG tablet Take 1 tablet (600 mg total) by mouth daily. 90 tablet 1  . rOPINIRole (REQUIP) 0.5 MG tablet Take 1 tablet (0.5 mg total) by mouth at bedtime. 90 tablet 1  . Spacer/Aero-Holding Chambers (AEROCHAMBER PLUS) inhaler Use as instructed to use with inahaler. 1 each 1  . tizanidine (ZANAFLEX) 2 MG capsule Take 3 capsules (6 mg total) by mouth at bedtime. 270 capsule 1  . Triamcinolone Acetonide (TRIAMCINOLONE 0.1 %  CREAM : EUCERIN) CREA Apply 1 application topically 2 (two) times daily as needed for rash or itching. 500 each 1   Current Facility-Administered Medications on File Prior to Visit  Medication Dose Route Frequency Provider Last Rate Last Dose  . triamcinolone acetonide (KENALOG-40) injection 40 mg  40 mg Intramuscular Once [name], [name], MD         Observations/Objective:   There were no vitals filed for this visit. GEN:  The patient appears stated age and is in NAD.  Neurological examination:  Orientation: The patient is alert and oriented x3. Cranial nerves: There is good facial symmetry. There is no facial hypomimia.  The speech is fluent  and clear. Soft palate rises symmetrically and there is no tongue deviation. Hearing is intact to conversational tone. Motor: Strength is at least antigravity x 4.   Shoulder shrug is equal and symmetric.  There is no pronator drift.     Chemistry      Component Value Date/Time   NA 142 01/30/2019 1142   K 4.1 01/30/2019 1142   CL 106 01/30/2019 1142   CO2 26 01/30/2019 1142   BUN 15 01/30/2019 1142   CREATININE 0.67 01/30/2019 1142   CREATININE 0.81 01/15/2017 1555      Component Value Date/Time   CALCIUM 9.2 01/30/2019 1142   ALKPHOS 81 03/29/2018 1317   AST 17 03/29/2018 1317   ALT 18 03/29/2018 1317   BILITOT 0.5 03/29/2018 1317     Lab Results  Component Value Date   WBC 4.2 04/23/2017   HGB 15.0 04/23/2017   HCT 44.4 04/23/2017   MCV 89.6 04/23/2017   PLT 133.0 (L) 04/23/2017     Assessment and Plan:   1.  Lower extremity cramping  -On Trileptal, 600 mg, half tablet twice per day.  Doing well with the medication.  Lab work looks good.  We will continue this.  -Tried Zanaflex and baclofen without relief.  Dilantin provided some relief.  -Discussed that limiting alcohol could help.  Discussed that alcohol is very dehydrating.  In addition, he is not drinking as much water because of the drinking of alcohol, and I suspect that is contributing to the cramping.  He does not necessarily disagree, but is not sure that he is willing to give up the alcohol.  2.  Tremor.  -No medication is necessary.  3.  Diabetic peripheral neuropathy  -Likely etiology of gait/balance issues.  4.  B12 deficiency  -On supplementation  Follow Up Instructions: 1 year   -I discussed the assessment and treatment plan with the patient. The patient was provided an opportunity to ask questions and all were answered. The patient agreed with the plan and demonstrated an understanding of the instructions.   The patient was advised to call back or seek an in-person evaluation if the symptoms  worsen or if the condition fails to improve as anticipated.    Total Time spent in visit with the patient was:  10 min, of which more than 50% of the time was spent in counseling.   Pt understands and agrees with the plan of care outlined.     Andrew Bogus, DO

## 2019-03-22 ENCOUNTER — Other Ambulatory Visit: Payer: Self-pay

## 2019-03-22 ENCOUNTER — Encounter: Payer: Self-pay | Admitting: Family Medicine

## 2019-03-22 ENCOUNTER — Telehealth (INDEPENDENT_AMBULATORY_CARE_PROVIDER_SITE_OTHER): Payer: Medicare HMO | Admitting: Neurology

## 2019-03-22 DIAGNOSIS — R252 Cramp and spasm: Secondary | ICD-10-CM

## 2019-03-22 DIAGNOSIS — E1140 Type 2 diabetes mellitus with diabetic neuropathy, unspecified: Secondary | ICD-10-CM | POA: Diagnosis not present

## 2019-03-22 DIAGNOSIS — D519 Vitamin B12 deficiency anemia, unspecified: Secondary | ICD-10-CM

## 2019-03-22 DIAGNOSIS — Z7289 Other problems related to lifestyle: Secondary | ICD-10-CM

## 2019-03-22 DIAGNOSIS — R251 Tremor, unspecified: Secondary | ICD-10-CM | POA: Diagnosis not present

## 2019-03-22 DIAGNOSIS — Z789 Other specified health status: Secondary | ICD-10-CM

## 2019-04-22 DIAGNOSIS — R69 Illness, unspecified: Secondary | ICD-10-CM | POA: Diagnosis not present

## 2019-05-02 ENCOUNTER — Ambulatory Visit: Payer: Medicare Other | Attending: Internal Medicine

## 2019-05-02 DIAGNOSIS — Z23 Encounter for immunization: Secondary | ICD-10-CM | POA: Insufficient documentation

## 2019-05-02 NOTE — Progress Notes (Signed)
   Covid-19 Vaccination Clinic  Name:  [name]    MRN: [id number] DOB: July 30, 1931  05/02/2019  [name] was observed post Covid-19 immunization for 15 minutes without incidence. He was provided with Vaccine Information Sheet and instruction to access the V-Safe system.   [name] was instructed to call 911 with any severe reactions post vaccine: Marland Kitchen Difficulty breathing  . Swelling of your face and throat  . A fast heartbeat  . A bad rash all over your body  . Dizziness and weakness    Immunizations Administered    Name Date Dose VIS Date Route   Pfizer COVID-19 Vaccine 05/02/2019 12:54 PM 0.3 mL 03/24/2019 Intramuscular   Manufacturer: [place]   Lot: [id number]   [place]: [id number]

## 2019-05-08 ENCOUNTER — Other Ambulatory Visit: Payer: Self-pay | Admitting: Family Medicine

## 2019-05-08 DIAGNOSIS — R252 Cramp and spasm: Secondary | ICD-10-CM

## 2019-05-09 ENCOUNTER — Other Ambulatory Visit: Payer: Self-pay | Admitting: Family Medicine

## 2019-05-09 DIAGNOSIS — E1140 Type 2 diabetes mellitus with diabetic neuropathy, unspecified: Secondary | ICD-10-CM

## 2019-05-10 ENCOUNTER — Encounter: Payer: Self-pay | Admitting: Cardiology

## 2019-05-22 ENCOUNTER — Ambulatory Visit: Payer: Medicare HMO | Attending: Internal Medicine

## 2019-05-22 DIAGNOSIS — Z23 Encounter for immunization: Secondary | ICD-10-CM

## 2019-05-22 NOTE — Progress Notes (Signed)
   Covid-19 Vaccination Clinic  Name:  [name]    MRN: [id number] DOB: 1932/02/01  05/22/2019  [name] was observed post Covid-19 immunization for 15 minutes without incidence. He was provided with Vaccine Information Sheet and instruction to access the V-Safe system.   [name] was instructed to call 911 with any severe reactions post vaccine: Marland Kitchen Difficulty breathing  . Swelling of your face and throat  . A fast heartbeat  . A bad rash all over your body  . Dizziness and weakness    Immunizations Administered    Name Date Dose VIS Date Route   Pfizer COVID-19 Vaccine 05/22/2019  1:43 PM 0.3 mL 03/24/2019 Intramuscular   Manufacturer: [place]   Lot: [id number]   [place]: [id number]

## 2019-06-01 ENCOUNTER — Other Ambulatory Visit: Payer: Self-pay | Admitting: Family Medicine

## 2019-06-01 DIAGNOSIS — N401 Enlarged prostate with lower urinary tract symptoms: Secondary | ICD-10-CM

## 2019-06-10 DIAGNOSIS — M1990 Unspecified osteoarthritis, unspecified site: Secondary | ICD-10-CM | POA: Diagnosis not present

## 2019-06-10 DIAGNOSIS — I2510 Atherosclerotic heart disease of native coronary artery without angina pectoris: Secondary | ICD-10-CM | POA: Diagnosis not present

## 2019-06-10 DIAGNOSIS — J45909 Unspecified asthma, uncomplicated: Secondary | ICD-10-CM | POA: Diagnosis not present

## 2019-06-10 DIAGNOSIS — I10 Essential (primary) hypertension: Secondary | ICD-10-CM | POA: Diagnosis not present

## 2019-06-10 DIAGNOSIS — G8929 Other chronic pain: Secondary | ICD-10-CM | POA: Diagnosis not present

## 2019-06-10 DIAGNOSIS — E1142 Type 2 diabetes mellitus with diabetic polyneuropathy: Secondary | ICD-10-CM | POA: Diagnosis not present

## 2019-06-10 DIAGNOSIS — R32 Unspecified urinary incontinence: Secondary | ICD-10-CM | POA: Diagnosis not present

## 2019-06-10 DIAGNOSIS — N400 Enlarged prostate without lower urinary tract symptoms: Secondary | ICD-10-CM | POA: Diagnosis not present

## 2019-06-10 DIAGNOSIS — R197 Diarrhea, unspecified: Secondary | ICD-10-CM | POA: Diagnosis not present

## 2019-06-10 DIAGNOSIS — M62838 Other muscle spasm: Secondary | ICD-10-CM | POA: Diagnosis not present

## 2019-07-03 DIAGNOSIS — H40003 Preglaucoma, unspecified, bilateral: Secondary | ICD-10-CM | POA: Diagnosis not present

## 2019-07-18 ENCOUNTER — Other Ambulatory Visit: Payer: Self-pay | Admitting: Family Medicine

## 2019-07-28 ENCOUNTER — Other Ambulatory Visit: Payer: Self-pay | Admitting: Family Medicine

## 2019-07-28 DIAGNOSIS — R69 Illness, unspecified: Secondary | ICD-10-CM | POA: Diagnosis not present

## 2019-08-07 ENCOUNTER — Encounter: Payer: Self-pay | Admitting: Family Medicine

## 2019-08-08 ENCOUNTER — Other Ambulatory Visit: Payer: Self-pay | Admitting: *Deleted

## 2019-08-08 DIAGNOSIS — R69 Illness, unspecified: Secondary | ICD-10-CM | POA: Diagnosis not present

## 2019-08-08 MED ORDER — ONETOUCH DELICA LANCETS 33G MISC: 4 refills | Status: AC

## 2019-08-10 ENCOUNTER — Encounter: Payer: Self-pay | Admitting: Family Medicine

## 2019-08-14 ENCOUNTER — Other Ambulatory Visit: Payer: Self-pay | Admitting: Family Medicine

## 2019-08-14 ENCOUNTER — Other Ambulatory Visit: Payer: Self-pay

## 2019-08-15 ENCOUNTER — Ambulatory Visit (INDEPENDENT_AMBULATORY_CARE_PROVIDER_SITE_OTHER): Payer: Medicare HMO | Admitting: Family Medicine

## 2019-08-15 ENCOUNTER — Encounter: Payer: Self-pay | Admitting: Family Medicine

## 2019-08-15 VITALS — BP 132/88 | HR 61 | Temp 97.5°F | Ht 63.0 in | Wt 174.5 lb

## 2019-08-15 DIAGNOSIS — I5032 Chronic diastolic (congestive) heart failure: Secondary | ICD-10-CM

## 2019-08-15 DIAGNOSIS — R252 Cramp and spasm: Secondary | ICD-10-CM | POA: Diagnosis not present

## 2019-08-15 DIAGNOSIS — H8110 Benign paroxysmal vertigo, unspecified ear: Secondary | ICD-10-CM | POA: Diagnosis not present

## 2019-08-15 DIAGNOSIS — I119 Hypertensive heart disease without heart failure: Secondary | ICD-10-CM

## 2019-08-15 DIAGNOSIS — E1140 Type 2 diabetes mellitus with diabetic neuropathy, unspecified: Secondary | ICD-10-CM

## 2019-08-15 LAB — BASIC METABOLIC PANEL
BUN: 18 mg/dL (ref 6–23)
CO2: 28 mEq/L (ref 19–32)
Calcium: 9.1 mg/dL (ref 8.4–10.5)
Chloride: 103 mEq/L (ref 96–112)
Creatinine, Ser: 0.65 mg/dL (ref 0.40–1.50)
GFR: 116.62 mL/min (ref >60.00)
Glucose, Bld: 144 mg/dL — ABNORMAL HIGH (ref 70–99)
Potassium: 3.9 mEq/L (ref 3.5–5.1)
Sodium: 137 mEq/L (ref 135–145)

## 2019-08-15 LAB — HEMOGLOBIN A1C: Hgb A1c MFr Bld: 6.4 % (ref 4.6–6.5)

## 2019-08-15 MED ORDER — LANCET DEVICE WITH EJECTOR MISC: 1.0000 | Freq: Every day | 1 refills | Status: AC

## 2019-08-15 NOTE — Progress Notes (Signed)
ACUTE VISIT  Chief Complaint  Patient presents with  . Dizziness    off and on for about 2 months   HPI: [name] is a 86 y.o. male, who is here today with his wife with above complaints. He was last seen on 01/30/19. No new problems since his last visit.  2 months of intermittent episodes of dizziness, described as spinning sensation. Exacerbated by bending over and when lying in bed. It last a few seconds at the time. No associated headache,CP,palpitations,SOB,diaphoresis,or MS changes. He has not noted hearing changes,tinnitus,or vomiting. Mild nausea and "slight" occipital headache this morning.  No recent URI or travel. He has not tried OTC treatments. Problem is getting better.  Hx of allergies, having rhinorrhea. Negative for fever,chills,sore throat,cough,or wheezing.  DM II: He is on Glipizide 5 mg bid. BS's 110's-150's most of the time. Negative for polydipsia,polyuria, or polyphagia. No hypoglycemic episodes.  Lab Results  Component Value Date   HGBA1C 6.4 01/30/2019   HTN: He is on Metoprolol Titrate 25 mg daily. + HFpEF. He doe snot take Furosemide often because aggravated urinary urgency and incontinence.  Negative for visual changes,orthopnea,PND,or worsening edema.  Lab Results  Component Value Date   CREATININE 0.67 01/30/2019   BUN 15 01/30/2019   NA 142 01/30/2019   K 4.1 01/30/2019   CL 106 01/30/2019   CO2 26 01/30/2019   Leg cramps. He tried Requip and it seems to help. He is still having cramps that waking up but in general he feels like he is sleeping better. He also follows with neuro and taking Trileptal and Zanaflex.  Recently pharmacy sent a request for Doxazosin 4 mg . He is now on Flomax 0.4 mg daily. Doxazosin was not helping with urinary frequency.  He is also on Proscar 5 mg daily. Evaluated by urologist 4-5 years ago.  Review of Systems  Constitutional: Positive for fatigue. Negative for activity change and  appetite change.  HENT: Negative for mouth sores, nosebleeds and trouble swallowing.   Eyes: Negative for discharge and redness.  Cardiovascular: Positive for leg swelling.  Gastrointestinal: Negative for abdominal pain.  Genitourinary: Negative for decreased urine volume, dysuria and hematuria.  Musculoskeletal: Positive for arthralgias, back pain and gait problem.  Allergic/Immunologic: Positive for environmental allergies.  Neurological: Negative for syncope and facial asymmetry.  Psychiatric/Behavioral: Negative for confusion. The patient is nervous/anxious.   Rest see pertinent positives and negatives per HPI.  Current Outpatient Medications on File Prior to Visit  Medication Sig Dispense Refill  . Albuterol Sulfate (PROAIR HFA IN) Inhale into the lungs as needed.     . Cyanocobalamin (B-12) 1000 MCG TABS Take 1,000 mcg by mouth daily.    . finasteride (PROSCAR) 5 MG tablet TAKE 1 TABLET BY MOUTH EVERY DAY 90 tablet 2  . furosemide (LASIX) 40 MG tablet TAKE 0.5 TABLET BY MOUTH 2 TIMES DAILY. YOU MAY TAKE 1 EXTRA IN THE EVENING IF YOU HAVE SOB, LEE, AND WEIGHT GAIN >3LBS IN 24 HOURS 270 tablet 1  . metoprolol tartrate (LOPRESSOR) 25 MG tablet Take 1 tablet (25 mg total) by mouth daily. 90 tablet 2  . OneTouch Delica Lancets 99991111 MISC USE TO CHECK BLOOD SUGAR DAILY AND PRN 100 each 4  . ONETOUCH ULTRA test strip USE TO CHECK BLOOD SUGAR DAILY E11.9 100 strip 4  . oxcarbazepine (TRILEPTAL) 600 MG tablet Take 1 tablet (600 mg total) by mouth daily. 90 tablet 1  . Spacer/Aero-Holding Chambers (AEROCHAMBER PLUS) inhaler  Use as instructed to use with inahaler. 1 each 1  . tamsulosin (FLOMAX) 0.4 MG CAPS capsule TAKE 1 CAPSULE BY MOUTH EVERY DAY AFTER SUPPER 90 capsule 0  . tizanidine (ZANAFLEX) 2 MG capsule TAKE 3 CAPSULES BY MOUTH EVERY DAY AT BEDTIME 270 capsule 1  . Triamcinolone Acetonide (TRIAMCINOLONE 0.1 % CREAM : EUCERIN) CREA Apply 1 application topically 2 (two) times daily as needed  for rash or itching. 500 each 1  . rOPINIRole (REQUIP) 0.5 MG tablet TAKE 1 TABLET BY MOUTH AT BEDTIME. 90 tablet 1   Current Facility-Administered Medications on File Prior to Visit  Medication Dose Route Frequency Provider Last Rate Last Admin  . triamcinolone acetonide (KENALOG-40) injection 40 mg  40 mg Intramuscular Once [name], Azaliyah Kennard G, MD         Past Medical History:  Diagnosis Date  . BACK PAIN, CHRONIC 04/18/2009  . BENIGN PROSTATIC HYPERTROPHY, HX OF 01/19/2007  . Carpal tunnel syndrome 03/24/2007  . CORONARY ARTERY DISEASE 01/19/2007  . DEPRESSION, CHRONIC 06/24/2007  . DIABETES MELLITUS 01/19/2007  . DIABETES MELLITUS, TYPE II, WITH NEUROLOGICAL COMPLICATIONS 99991111  . Diarrhea 02/23/2007  . HEART MURMUR, SYSTOLIC [id number]  . HERPES SIMPLEX INFECTION 01/19/2007  . HYPERLIPIDEMIA 01/19/2007  . HYPERTENSION 01/19/2007  . Intermediate coronary syndrome ([place]) 08/29/2008  . LEG CRAMPS, NOCTURNAL 10/28/2007  . Leg cramps, sleep related 08/05/2010  . OBSTRUCTIVE SLEEP APNEA 01/19/2007   does not use CPAP  . OSTEOARTHRITIS 06/24/2007  . Other postprocedural status(V45.89) 01/19/2007  . PERCUTANEOUS TRANSLUMINAL CORONARY ANGIOPLASTY, HX OF 01/19/2007  . PLANTAR FASCIITIS, RIGHT 01/19/2007  . SHOULDER PAIN, LEFT 03/24/2007  . SPINAL STENOSIS, LUMBAR 06/03/2009  . TINEA PEDIS 11/15/2009  . URI 01/11/2009   No Known Allergies  Social History   Socioeconomic History  . Marital status: Married    Spouse name: Not on file  . Number of children: Not on file  . Years of education: Not on file  . Highest education level: Not on file  Occupational History  . Occupation: retired    Comment: Truck Research officer, political party  Tobacco Use  . Smoking status: Former Smoker    Quit date: 04/14/1983    Years since quitting: 36.3  . Smokeless tobacco: Never Used  . Tobacco comment: stopped 1985  Substance and Sexual Activity  . Alcohol use: Yes    Comment: 24 oz daily wine  . Drug use: Not Currently     Types: Marijuana    Comment: occ  . Sexual activity: Not on file  Other Topics Concern  . Not on file  Social History Narrative   Married in 1959 - 74yrs, divorced; married 1974 - 1.5 years, divorced; married 1985 1 daughter 50; 54 step daughters; 2 grandchildren.   Social Determinants of Health   Financial Resource Strain:   . Difficulty of Paying Living Expenses:   Food Insecurity:   . Worried About Charity fundraiser in the Last Year:   . Arboriculturist in the Last Year:   Transportation Needs:   . Film/video editor (Medical):   Marland Kitchen Lack of Transportation (Non-Medical):   Physical Activity:   . Days of Exercise per Week:   . Minutes of Exercise per Session:   Stress:   . Feeling of Stress :   Social Connections:   . Frequency of Communication with Friends and Family:   . Frequency of Social Gatherings with Friends and Family:   . Attends Religious Services:   . Active  Member of Clubs or Organizations:   . Attends Archivist Meetings:   Marland Kitchen Marital Status:     Vitals:   08/15/19 1053  BP: 132/88  Pulse: 61  Temp: (!) 97.5 F (36.4 C)  SpO2: 96%   Body mass index is 30.91 kg/m.  Physical Exam  Nursing note and vitals reviewed. Constitutional: He is oriented to person, place, and time. He appears well-developed. No distress.  HENT:  Head: Normocephalic and atraumatic.  Right Ear: Tympanic membrane, external ear and ear canal normal. Decreased hearing is noted.  Left Ear: Tympanic membrane, external ear and ear canal normal. Decreased hearing is noted.  Mouth/Throat: Oropharynx is clear and moist and mucous membranes are normal.  + Hearing aids. Spinning sensation elicited when lying down.  Eyes: Pupils are equal, round, and reactive to light. Conjunctivae are normal.  Cardiovascular: Normal rate and regular rhythm.  Murmur (SEM I/VI LUSB) heard. Pulses:      Dorsalis pedis pulses are 2+ on the right side and 2+ on the left side.  Respiratory:  Effort normal and breath sounds normal. No respiratory distress.  GI: Soft. He exhibits no mass. There is no hepatomegaly. There is no abdominal tenderness.  Musculoskeletal:        General: Edema (2+ pitting LE edema,bilateral.) present.  Lymphadenopathy:    He has no cervical adenopathy.  Neurological: He is alert and oriented to person, place, and time. He has normal strength. No cranial nerve deficit.  Unstable gait, assisted with a cane.  Skin: Skin is warm. No rash noted. No erythema.  Psychiatric: He has a normal mood and affect.  Well groomed, good eye contact.   ASSESSMENT AND PLAN: Mr. Tyreque was seen today for dizziness.  Diagnoses and all orders for this visit:  Orders Placed This Encounter  Procedures  . Basic metabolic panel  . Hemoglobin A1c   Lab Results  Component Value Date   HGBA1C 6.4 08/15/2019   Lab Results  Component Value Date   CREATININE 0.65 08/15/2019   BUN 18 08/15/2019   NA 137 08/15/2019   K 3.9 08/15/2019   CL 103 08/15/2019   CO2 28 08/15/2019   Benign paroxysmal positional vertigo, unspecified laterality We discussed other possible etiologies of dizziness, Hx and examination today suggest benign vertigo. I do not think further work-up is necessary at this time but needs to be consider if worsening or persient symptoms for more that 2 weeks; in this case ENT will be considered. Explained that problem can be recurrent. Fall prevention. It is mild , so I am not recommending Meclizine for now. Instructed about warning signs.  Type 2 diabetes mellitus with diabetic neuropathy, without long-term current use of insulin ([place]) HgA1C has been at goal. No changes in current management. Healthy diet with avoidance of added sugar food intake is an important part of treatment and recommended. Exercise capacity is limited due to OA. Annual eye exam, periodic dental and foot care recommended. F/U in 5-6 months  -     Lancet Devices (LANCET DEVICE  WITH EJECTOR) MISC; 1 Device by Does not apply route daily. -     glipiZIDE (GLUCOTROL) 5 MG tablet; TAKE 1 TABLET BY MOUTH TWICE A DAY BEFORE A MEAL  Hypertension with heart disease BP adequately controlled. No changes in current management. Continue monitoring BP regularly. Continue low salt diet.  Cramp of limb Still not well controlled but reporting some improvement. Following with neuro.  Chronic diastolic congestive heart failure ([place])  Continue Metoprolol and Furosemide. Low sat diet. Last echo 08/31/2017: LVEF 60-65% and grade 2 diastolic dysfunction.   Return in about 6 months (around 02/15/2020).   Jiovany Scheffel G. [name], MD  [facility]. [place] office.  Discharge Instructions   None    A few things to remember from today's visit:   Benign paroxysmal positional vertigo, unspecified laterality  Type 2 diabetes mellitus with diabetic neuropathy, without long-term current use of insulin (HCC)  Hypertension with heart disease  Cramp of limb  No changes in medications today.  Dizziness is a perception of movement, it is sometimes difficult to describe and can be  caused by different problems, most benign but others can be life threaten.  Vertigo is the most common cause of dizziness, usually related with inner ear and can be associated with nausea, vomiting, and unbalance sensation. It can be complicated by falls due to lose of balance; so fall precautions are very important.  Most of the time dizziness is benign, usually intermittent, last a few seconds at the time and aggravated by certain positions. It usually resolves in a few weeks without residual effect but it could be recurrent.  Prevent activities that can increase the risk for falls.  Seek immediate medical attention if: New severe headache, dobble vision, fever (100 F or more), associated numbness/tingling, focal weakness, persistent vomiting, not able to walk, or sudden worsening symptoms.  If  you need refills please call your pharmacy. Do not use My Chart to request refills or for acute issues that need immediate attention.    Please be sure medication list is accurate. If a new problem present, please set up appointment sooner than planned today.

## 2019-08-15 NOTE — Patient Instructions (Addendum)
A few things to remember from today's visit:   Benign paroxysmal positional vertigo, unspecified laterality  Type 2 diabetes mellitus with diabetic neuropathy, without long-term current use of insulin (HCC)  Hypertension with heart disease  Cramp of limb  No changes in medications today.  Dizziness is a perception of movement, it is sometimes difficult to describe and can be  caused by different problems, most benign but others can be life threaten.  Vertigo is the most common cause of dizziness, usually related with inner ear and can be associated with nausea, vomiting, and unbalance sensation. It can be complicated by falls due to lose of balance; so fall precautions are very important.  Most of the time dizziness is benign, usually intermittent, last a few seconds at the time and aggravated by certain positions. It usually resolves in a few weeks without residual effect but it could be recurrent.  Prevent activities that can increase the risk for falls.  Seek immediate medical attention if: New severe headache, dobble vision, fever (100 F or more), associated numbness/tingling, focal weakness, persistent vomiting, not able to walk, or sudden worsening symptoms.  If you need refills please call your pharmacy. Do not use My Chart to request refills or for acute issues that need immediate attention.    Please be sure medication list is accurate. If a new problem present, please set up appointment sooner than planned today.

## 2019-08-16 DIAGNOSIS — R69 Illness, unspecified: Secondary | ICD-10-CM | POA: Diagnosis not present

## 2019-08-17 MED ORDER — GLIPIZIDE 5 MG PO TABS: ORAL_TABLET | ORAL | 1 refills | Status: DC

## 2019-08-28 ENCOUNTER — Other Ambulatory Visit: Payer: Self-pay | Admitting: Neurology

## 2019-08-29 NOTE — Telephone Encounter (Signed)
Rx(s) sent to pharmacy electronically.  

## 2019-09-13 ENCOUNTER — Encounter (HOSPITAL_BASED_OUTPATIENT_CLINIC_OR_DEPARTMENT_OTHER): Payer: Self-pay

## 2019-09-13 ENCOUNTER — Other Ambulatory Visit: Payer: Self-pay

## 2019-09-13 ENCOUNTER — Emergency Department (HOSPITAL_BASED_OUTPATIENT_CLINIC_OR_DEPARTMENT_OTHER): Payer: Medicare HMO

## 2019-09-13 ENCOUNTER — Emergency Department (HOSPITAL_BASED_OUTPATIENT_CLINIC_OR_DEPARTMENT_OTHER)
Admission: EM | Admit: 2019-09-13 | Discharge: 2019-09-13 | Disposition: A | Discharge status: Home / Self Care | Payer: Medicare HMO | Attending: Emergency Medicine | Admitting: Emergency Medicine

## 2019-09-13 DIAGNOSIS — L03114 Cellulitis of left upper limb: Secondary | ICD-10-CM | POA: Insufficient documentation

## 2019-09-13 DIAGNOSIS — S6722XA Crushing injury of left hand, initial encounter: Secondary | ICD-10-CM | POA: Diagnosis not present

## 2019-09-13 DIAGNOSIS — Z87891 Personal history of nicotine dependence: Secondary | ICD-10-CM | POA: Diagnosis not present

## 2019-09-13 DIAGNOSIS — M79642 Pain in left hand: Secondary | ICD-10-CM | POA: Diagnosis present

## 2019-09-13 DIAGNOSIS — E785 Hyperlipidemia, unspecified: Secondary | ICD-10-CM | POA: Diagnosis not present

## 2019-09-13 DIAGNOSIS — I2510 Atherosclerotic heart disease of native coronary artery without angina pectoris: Secondary | ICD-10-CM | POA: Diagnosis not present

## 2019-09-13 DIAGNOSIS — J449 Chronic obstructive pulmonary disease, unspecified: Secondary | ICD-10-CM | POA: Insufficient documentation

## 2019-09-13 DIAGNOSIS — E119 Type 2 diabetes mellitus without complications: Secondary | ICD-10-CM | POA: Diagnosis not present

## 2019-09-13 DIAGNOSIS — Z79899 Other long term (current) drug therapy: Secondary | ICD-10-CM | POA: Diagnosis not present

## 2019-09-13 DIAGNOSIS — I110 Hypertensive heart disease with heart failure: Secondary | ICD-10-CM | POA: Diagnosis not present

## 2019-09-13 DIAGNOSIS — M7989 Other specified soft tissue disorders: Secondary | ICD-10-CM | POA: Diagnosis not present

## 2019-09-13 DIAGNOSIS — Z7984 Long term (current) use of oral hypoglycemic drugs: Secondary | ICD-10-CM | POA: Insufficient documentation

## 2019-09-13 DIAGNOSIS — I5030 Unspecified diastolic (congestive) heart failure: Secondary | ICD-10-CM | POA: Insufficient documentation

## 2019-09-13 MED ORDER — CEPHALEXIN 500 MG PO CAPS
500.0000 mg | ORAL_CAPSULE | Freq: Four times a day (QID) | ORAL | 0 refills | Status: DC
Start: 2019-09-13 — End: 2019-11-21

## 2019-09-13 MED ORDER — CEPHALEXIN 250 MG PO CAPS
500.0000 mg | ORAL_CAPSULE | Freq: Once | ORAL | Status: AC
  Administered 2019-09-13: 500 mg via ORAL
  Filled 2019-09-13: qty 2

## 2019-09-13 NOTE — ED Triage Notes (Signed)
Pt states his dog bit left hand 5/29-area with scab and redness-NAD-slow gait with own cane

## 2019-09-13 NOTE — Discharge Instructions (Addendum)
Your xray was without broken bone  Return for rapid spreading redness, fever, drainage.  Follow up with your family doc.

## 2019-09-13 NOTE — ED Provider Notes (Signed)
[facility] EMERGENCY DEPARTMENT Provider Note   CSN: [id number] Arrival date & time: 09/13/19  1550     History Chief Complaint  Patient presents with  . Animal Bite    [name] is a 86 y.o. male.  86 yo M with a chief complaints of a left hand injury.  Patient was initially triage that his dog had bit him on the hands but in reality the patient said that he struck his hand against his car when he was trying to get his dog into the car.  He denies any animal related injury otherwise.  He applied black pepper to the area and got the bleeding to stop.  Starting to hurt him a little bit more today and so his family suggested he come to the ED for evaluation.  He denies fevers.  Denies other areas of injury.  The history is provided by the patient.  Animal Bite Associated symptoms: no fever and no rash   Illness Severity:  Moderate Onset quality:  Gradual Timing:  Constant Progression:  Worsening Chronicity:  New Associated symptoms: no abdominal pain, no chest pain, no congestion, no diarrhea, no fever, no headaches, no myalgias, no rash, no shortness of breath and no vomiting        Past Medical History:  Diagnosis Date  . BACK PAIN, CHRONIC 04/18/2009  . BENIGN PROSTATIC HYPERTROPHY, HX OF 01/19/2007  . Carpal tunnel syndrome 03/24/2007  . CORONARY ARTERY DISEASE 01/19/2007  . DEPRESSION, CHRONIC 06/24/2007  . DIABETES MELLITUS 01/19/2007  . DIABETES MELLITUS, TYPE II, WITH NEUROLOGICAL COMPLICATIONS 99991111  . Diarrhea 02/23/2007  . HEART MURMUR, SYSTOLIC [id number]  . HERPES SIMPLEX INFECTION 01/19/2007  . HYPERLIPIDEMIA 01/19/2007  . HYPERTENSION 01/19/2007  . Intermediate coronary syndrome ([place]) 08/29/2008  . LEG CRAMPS, NOCTURNAL 10/28/2007  . Leg cramps, sleep related 08/05/2010  . OBSTRUCTIVE SLEEP APNEA 01/19/2007   does not use CPAP  . OSTEOARTHRITIS 06/24/2007  . Other postprocedural status(V45.89) 01/19/2007  . PERCUTANEOUS TRANSLUMINAL CORONARY  ANGIOPLASTY, HX OF 01/19/2007  . PLANTAR FASCIITIS, RIGHT 01/19/2007  . SHOULDER PAIN, LEFT 03/24/2007  . SPINAL STENOSIS, LUMBAR 06/03/2009  . TINEA PEDIS 11/15/2009  . URI 01/11/2009    Patient Active Problem List   Diagnosis Date Noted  . Urine incontinence 03/29/2018  . Bilateral lower extremity edema 03/29/2018  . Chronic otitis externa of both ears 03/29/2018  . Forgetfulness 12/17/2017  . Peripheral neuropathy 10/30/2016  . Chronic fatigue 10/30/2016  . Knee osteoarthritis 07/26/2016  . Chronic diarrhea 10/16/2015  . Murmur 08/29/2015  . Sinus bradycardia 08/29/2015  . Myalgia due to statin 08/29/2015  . Coronary artery disease involving native coronary artery of native heart without angina pectoris 08/29/2015  . Cramp of both lower extremities 08/29/2015  . Insomnia 08/12/2015  . Diastolic CHF ([place]) [id number]  . COPD (chronic obstructive pulmonary disease) ([place]) 07/22/2015  . Spondylolisthesis of lumbar region 05/02/2013  . Cervical spine disease 04/16/2013  . Advanced care planning/counseling discussion 03/02/2012  . BPH with urinary obstruction 03/22/2011  . Diabetes mellitus type 2 with neurological manifestations ([place]) 06/17/2010  . SPINAL STENOSIS, LUMBAR 06/03/2009  . BACK PAIN, CHRONIC 04/18/2009  . HEART MURMUR, SYSTOLIC [id number]  . Cramp of limb 10/28/2007  . Depression, major, recurrent, moderate ([place]) 06/24/2007  . Generalized osteoarthritis of multiple sites 06/24/2007  . CARPAL TUNNEL SYNDROME 03/24/2007  . HERPES SIMPLEX INFECTION 01/19/2007  . Type 2 diabetes mellitus with diabetic neuropathy, unspecified ([place]) 01/19/2007  . Hyperlipidemia, mixed 01/19/2007  .  OBSTRUCTIVE SLEEP APNEA 01/19/2007  . Hypertension with heart disease 01/19/2007  . CORONARY ARTERY DISEASE 01/19/2007  . BPH associated with nocturia 01/19/2007  . PERCUTANEOUS TRANSLUMINAL CORONARY ANGIOPLASTY, HX OF 01/19/2007    Past Surgical History:  Procedure Laterality Date  .  arthroscopy, knee of hx    . CARDIAC CATHETERIZATION    . CATARACT EXTRACTION Left   . COLONOSCOPY    . inguinal herniorrhapy, hx    . NASAL SINUS SURGERY    . percutaneous transluminal coronary angioplasty, hx of  2004   Dr. Lyndel Safe  . plastic joint in thumb    . PTCA/stent  2004  . TONSILLECTOMY    . VASECTOMY         Family History  Problem Relation Age of Onset  . Arthritis Sister   . Diabetes Other   . Coronary artery disease Other     Social History   Tobacco Use  . Smoking status: Former Smoker    Quit date: 04/14/1983    Years since quitting: 36.4  . Smokeless tobacco: Never Used  . Tobacco comment: stopped 1985  Substance Use Topics  . Alcohol use: Yes    Comment: 24 oz daily wine  . Drug use: Not Currently    Types: Marijuana    Comment: occ    Home Medications Prior to Admission medications   Medication Sig Start Date End Date Taking? Authorizing Provider  Albuterol Sulfate (PROAIR HFA IN) Inhale into the lungs as needed.     [provider]  cephALEXin (KEFLEX) 500 MG capsule Take 1 capsule (500 mg total) by mouth 4 (four) times daily. 09/13/19   Deno Etienne, DO  Cyanocobalamin (B-12) 1000 MCG TABS Take 1,000 mcg by mouth daily.    [provider]  finasteride (PROSCAR) 5 MG tablet TAKE 1 TABLET BY MOUTH EVERY DAY 06/02/19   [name], [name], MD  furosemide (LASIX) 40 MG tablet TAKE 0.5 TABLET BY MOUTH 2 TIMES DAILY. YOU MAY TAKE 1 EXTRA IN THE EVENING IF YOU HAVE SOB, LEE, AND WEIGHT GAIN >3LBS IN 24 HOURS 10/25/18   [name], [name], MD  glipiZIDE (GLUCOTROL) 5 MG tablet TAKE 1 TABLET BY MOUTH TWICE A DAY BEFORE A MEAL 08/17/19   [name], [name], MD  Lancet Devices (LANCET DEVICE WITH EJECTOR) MISC 1 Device by Does not apply route daily. 08/15/19   [name], [name], MD  metoprolol tartrate (LOPRESSOR) 25 MG tablet Take 1 tablet (25 mg total) by mouth daily. 11/02/18   [name], [name], MD  OneTouch Delica Lancets 99991111 MISC USE TO CHECK BLOOD SUGAR DAILY  AND PRN 08/08/19   [name], [name], MD  [facility] ULTRA test strip USE TO CHECK BLOOD SUGAR DAILY E11.9 07/28/19   [name], [name], MD  oxcarbazepine (TRILEPTAL) 600 MG tablet Take 0.5 tablets (300 mg total) by mouth 2 (two) times daily. 08/29/19   Tat, Eustace Quail, DO  rOPINIRole (REQUIP) 0.5 MG tablet TAKE 1 TABLET BY MOUTH AT BEDTIME. 08/16/19   [name], [name], MD  Spacer/Aero-Holding Chambers (AEROCHAMBER PLUS) inhaler Use as instructed to use with inahaler. 04/24/18   [name], [name], MD  tamsulosin (FLOMAX) 0.4 MG CAPS capsule TAKE 1 CAPSULE BY MOUTH EVERY DAY AFTER SUPPER 07/19/19   [name], [name], MD  tizanidine (ZANAFLEX) 2 MG capsule TAKE 3 CAPSULES BY MOUTH EVERY DAY AT BEDTIME 05/08/19   [name], [name], MD  Triamcinolone Acetonide (TRIAMCINOLONE 0.1 % CREAM : EUCERIN) CREA Apply 1 application  topically 2 (two) times daily as needed for rash or itching. 03/29/18   [name], [name], MD    Allergies    Patient has no known allergies.  Review of Systems   Review of Systems  Constitutional: Negative for chills and fever.  HENT: Negative for congestion and facial swelling.   Eyes: Negative for discharge and visual disturbance.  Respiratory: Negative for shortness of breath.   Cardiovascular: Negative for chest pain and palpitations.  Gastrointestinal: Negative for abdominal pain, diarrhea and vomiting.  Musculoskeletal: Positive for arthralgias. Negative for myalgias.  Skin: Negative for color change and rash.  Neurological: Negative for tremors, syncope and headaches.  Psychiatric/Behavioral: Negative for confusion and dysphoric mood.    Physical Exam Updated Vital Signs BP (!) 185/108 (BP Location: Right Arm)   Pulse 83   Temp 98.1 F (36.7 C) (Oral)   Resp 16   Ht 5\' 3"  (1.6 m)   Wt 78.7 kg   SpO2 96%   BMI 30.73 kg/m   Physical Exam Vitals and nursing note reviewed.  Constitutional:      Appearance: He is well-developed.  HENT:     Head: Normocephalic and atraumatic.    Eyes:     Pupils: Pupils are equal, round, and reactive to light.  Neck:     Vascular: No JVD.  Cardiovascular:     Rate and Rhythm: Normal rate and regular rhythm.     Heart sounds: No murmur. No friction rub. No gallop.   Pulmonary:     Effort: No respiratory distress.     Breath sounds: No wheezing.  Abdominal:     General: There is no distension.     Tenderness: There is no guarding or rebound.  Musculoskeletal:        General: Tenderness present. Normal range of motion.     Cervical back: Normal range of motion and neck supple.     Comments: Old appearing laceration with mild surrounding erythema to the dorsum of the left hand about the second and third digit.  No obvious fluctuance.  No obvious bony tenderness.  Mildly edematous compared to the other hand.  Pulse motor and sensation are intact.  Skin:    Coloration: Skin is not pale.     Findings: No rash.  Neurological:     Mental Status: He is alert and oriented to person, place, and time.  Psychiatric:        Behavior: Behavior normal.     ED Results / Procedures / Treatments   Labs (all labs ordered are listed, but only abnormal results are displayed) Labs Reviewed - No data to display  EKG None  Radiology No results found.  Procedures Procedures (including critical care time)  Medications Ordered in ED Medications  cephALEXin (KEFLEX) capsule 500 mg (500 mg Oral Given 09/13/19 1619)    ED Course  I have reviewed the triage vital signs and the nursing notes.  Pertinent labs & imaging results that were available during my care of the patient were reviewed by me and considered in my medical decision making (see chart for details).    MDM Rules/Calculators/A&P                      86 yo M with likely early cellulitis after a skin tear after bumping his hand against a car.  Will obtain an x-ray to evaluate for foreign body or fracture.  Started on antibiotics.  PCP follow-up.  X-ray viewed by me  without  fracture.  5:10 PM:  I have discussed the diagnosis/risks/treatment options with the patient and family and believe the pt to be eligible for discharge home to follow-up with PCP. We also discussed returning to the ED immediately if new or worsening sx occur. We discussed the sx which are most concerning (e.g.,rapid spreading redness, drainage, fever, inability to tolerate by mouth) that necessitate immediate return. Medications administered to the patient during their visit and any new prescriptions provided to the patient are listed below.  Medications given during this visit Medications  cephALEXin (KEFLEX) capsule 500 mg (500 mg Oral Given 09/13/19 1619)     The patient appears reasonably screen and/or stabilized for discharge and I doubt any other medical condition or other [facility] requiring further screening, evaluation, or treatment in the ED at this time prior to discharge.   Final Clinical Impression(s) / ED Diagnoses Final diagnoses:  Cellulitis of left upper extremity    Rx / DC Orders ED Discharge Orders         Ordered    cephALEXin (KEFLEX) 500 MG capsule  4 times daily     09/13/19 [place], Krystal Teachey, DO 09/13/19 1710

## 2019-09-16 ENCOUNTER — Other Ambulatory Visit: Payer: Self-pay | Admitting: Family Medicine

## 2019-09-20 ENCOUNTER — Encounter: Payer: Self-pay | Admitting: Family Medicine

## 2019-09-29 ENCOUNTER — Encounter: Payer: Self-pay | Admitting: Family Medicine

## 2019-10-14 ENCOUNTER — Other Ambulatory Visit: Payer: Self-pay | Admitting: Family Medicine

## 2019-10-30 ENCOUNTER — Encounter: Payer: Self-pay | Admitting: Family Medicine

## 2019-10-31 MED ORDER — TAMSULOSIN HCL 0.4 MG PO CAPS: ORAL_CAPSULE | ORAL | 2 refills | Status: DC

## 2019-10-31 MED ORDER — METOPROLOL TARTRATE 25 MG PO TABS: 25.0000 mg | ORAL_TABLET | Freq: Every day | ORAL | 2 refills | Status: DC

## 2019-11-07 ENCOUNTER — Other Ambulatory Visit: Payer: Self-pay | Admitting: Family Medicine

## 2019-11-07 DIAGNOSIS — R252 Cramp and spasm: Secondary | ICD-10-CM

## 2019-11-16 ENCOUNTER — Other Ambulatory Visit: Payer: Self-pay | Admitting: Family Medicine

## 2019-11-20 NOTE — Patient Instructions (Addendum)
A few things to remember from today's visit:  Type 2 diabetes mellitus with diabetic neuropathy, without long-term current use of insulin ([place]) - Plan: POC HgB A1c, glipiZIDE (GLUCOTROL) 5 MG tablet  Diabetes mellitus type 2 with neurological manifestations ([facility])  Glipizide decreased from 2 tabs daily to 1 tab 10 min before the biggest meal. Tylenol max 3 g daily. Fall precautions. Use you Albuterol when feeling short of breath. Let me know if you change up your mind about echo or if you want to follow with heart doctor.  If you need refills please call your pharmacy. Do not use My Chart to request refills or for acute issues that need immediate attention.   Please be sure medication list is accurate. If a new problem present, please set up appointment sooner than planned today.  If we have ordered labs or studies at this visit, it can take up to 1-2 weeks for results and processing. IF results require follow up or explanation, we will call you with instructions. Clinically stable results will be released to your [facility]. If you have not heard from [place] or cannot find your results in [facility] in 2 weeks please contact our office at [phone].  If you are not yet signed up for [facility], please consider signing up.

## 2019-11-21 ENCOUNTER — Other Ambulatory Visit: Payer: Self-pay

## 2019-11-21 ENCOUNTER — Ambulatory Visit (INDEPENDENT_AMBULATORY_CARE_PROVIDER_SITE_OTHER): Payer: Medicare HMO | Admitting: Family Medicine

## 2019-11-21 ENCOUNTER — Encounter: Payer: Self-pay | Admitting: Family Medicine

## 2019-11-21 VITALS — BP 136/80 | HR 80 | Temp 98.0°F | Resp 16 | Ht 63.0 in | Wt 170.2 lb

## 2019-11-21 DIAGNOSIS — M159 Polyosteoarthritis, unspecified: Secondary | ICD-10-CM

## 2019-11-21 DIAGNOSIS — I119 Hypertensive heart disease without heart failure: Secondary | ICD-10-CM

## 2019-11-21 DIAGNOSIS — R32 Unspecified urinary incontinence: Secondary | ICD-10-CM | POA: Diagnosis not present

## 2019-11-21 DIAGNOSIS — J449 Chronic obstructive pulmonary disease, unspecified: Secondary | ICD-10-CM

## 2019-11-21 DIAGNOSIS — E1149 Type 2 diabetes mellitus with other diabetic neurological complication: Secondary | ICD-10-CM

## 2019-11-21 DIAGNOSIS — F331 Major depressive disorder, recurrent, moderate: Secondary | ICD-10-CM | POA: Diagnosis not present

## 2019-11-21 DIAGNOSIS — I5032 Chronic diastolic (congestive) heart failure: Secondary | ICD-10-CM | POA: Diagnosis not present

## 2019-11-21 DIAGNOSIS — R69 Illness, unspecified: Secondary | ICD-10-CM | POA: Diagnosis not present

## 2019-11-21 LAB — POCT GLYCOSYLATED HEMOGLOBIN (HGB A1C): Hemoglobin A1C: 6.7 % — AB (ref 4.0–5.6)

## 2019-11-21 MED ORDER — GLIPIZIDE 5 MG PO TABS: ORAL_TABLET | ORAL | 1 refills | Status: DC

## 2019-11-21 MED ORDER — ALBUTEROL SULFATE HFA 108 (90 BASE) MCG/ACT IN AERS: 2.0000 | INHALATION_SPRAY | Freq: Four times a day (QID) | RESPIRATORY_TRACT | 3 refills | Status: DC | PRN

## 2019-11-21 NOTE — Assessment & Plan Note (Signed)
We discussed Dx,prognosis,and treatment options. Given his age + CVD, recommend continuing with Tylenol, max 3 g daily. Fall precautions.

## 2019-11-21 NOTE — Assessment & Plan Note (Addendum)
Problem is not well controlled. For now he does not want to go to urologist. Furosemide side effects discussed. Continue Flomax 0.4 mg daily.

## 2019-11-21 NOTE — Progress Notes (Signed)
Chief Complaint  Patient presents with  . Blood Sugar Problem  . Pain    HPI: [name] is a 86 y.o. male with history of diastolic dysfunction, DM 2, generalized OA, and COPD here today concerned about above issues.  She was last seen on 08/15/19. Generalized OA, mainly affecting knees. In the past he has followed with orthopedics, he was not interested in surgical treatment. He has tried tramadol, hydrocodone, and Percocet in the past with no major benefit. He followed with pain management for 1 to 2 years, stopped because of cost. He has taken Tylenol and it seems to help some, he wonders how much Tylenol he can take.  Urinary frequency and urine leakage:  This is a chronic problem. He did not keep appointment with urologist. If he is visiting or going out it can be 3-4 hours before her feels the need to void. But when he is at home he feels like he needs to go q 20 min and has urine leakage if does not go right away. He wonders if this is a mental issue. Currently he is on Flomax 0.4 mg daily. Negative for dysuria, gross hematuria, or decreased urine output.  DM2: Currently he is on glipizide 5 mg twice daily. He denies checking BS daily. He has had "shaky" episodes and found out it was because low BS's, low 70's. Sometimes in the 200's. He is skipping breakfast and lunch, eating when he feels hungry. Negative for polydipsia and polyphagia.  Lab Results  Component Value Date   HGBA1C 6.4 08/15/2019   Hypertension: Currently he is on metoprolol tartrate 25 mg twice daily. Diastolic dysfunction: Negative for orthopnea and PND. 08/2017 LVEF 60-65% + grade 2 diastolic dysfunction.  Lower extremity edema, worse at the end of the day. He takes furosemide 40 mg daily, one half twice daily, he was instructed by cardiologist to take it twice daily but it makes urinary symptoms worse. Lower extremity edema seems to be stable. Negative for unusual headache, CP, palpitations,  or diaphoresis.  Lab Results  Component Value Date   CREATININE 0.65 08/15/2019   BUN 18 08/15/2019   NA 137 08/15/2019   K 3.9 08/15/2019   CL 103 08/15/2019   CO2 28 08/15/2019   Fell from his bed about 6 months ago, no serious injury or head trauma.  "Little" SOB and fatigue with some activities, no more than usual. Occasionally cough and wheezing. He does not been consistent with Albuterol inh, he has it at home.  Falling asleep better. Did not take alcohol or marijuana yesterday and did well; he has done this to help with sleep. Wakes up to use the bathroom and it is hard to go back to sleep.  Review of Systems  Constitutional: Negative for activity change, appetite change and fever.  HENT: Negative for mouth sores, nosebleeds and sore throat.   Eyes: Negative for redness and visual disturbance.  Gastrointestinal: Negative for abdominal pain, nausea and vomiting.  Musculoskeletal: Positive for arthralgias, back pain and gait problem.  Skin: Negative for rash and wound.  Neurological: Negative for syncope and facial asymmetry.  Psychiatric/Behavioral: Positive for sleep disturbance. Negative for confusion.  Rest of ROS, see pertinent positives sand negatives in HPI  Current Outpatient Medications on File Prior to Visit  Medication Sig Dispense Refill  . Cyanocobalamin (B-12) 1000 MCG TABS Take 1,000 mcg by mouth daily.    . finasteride (PROSCAR) 5 MG tablet TAKE 1 TABLET BY MOUTH EVERY DAY 90  tablet 2  . furosemide (LASIX) 40 MG tablet TAKE 0.5 TABLET BY MOUTH 2 TIMES DAILY. YOU MAY TAKE 1 EXTRA IN THE EVENING IF YOU HAVE SOB, LEE, AND WEIGHT GAIN >3LBS IN 24 HOURS 270 tablet 1  . Lancet Devices (LANCET DEVICE WITH EJECTOR) MISC 1 Device by Does not apply route daily. 1 each 1  . metoprolol tartrate (LOPRESSOR) 25 MG tablet Take 1 tablet (25 mg total) by mouth daily. 90 tablet 2  . OneTouch Delica Lancets 66A MISC USE TO CHECK BLOOD SUGAR DAILY AND PRN 100 each 4  .  ONETOUCH ULTRA test strip USE TO CHECK BLOOD SUGAR DAILY E11.9 100 strip 4  . oxcarbazepine (TRILEPTAL) 600 MG tablet Take 0.5 tablets (300 mg total) by mouth 2 (two) times daily. 90 tablet 1  . rOPINIRole (REQUIP) 0.5 MG tablet TAKE 1 TABLET BY MOUTH AT BEDTIME. 90 tablet 1  . Spacer/Aero-Holding Chambers (AEROCHAMBER PLUS) inhaler Use as instructed to use with inahaler. 1 each 1  . tamsulosin (FLOMAX) 0.4 MG CAPS capsule TAKE 1 CAPSULE BY MOUTH EVERY DAY AFTER SUPPER 90 capsule 2  . tizanidine (ZANAFLEX) 2 MG capsule TAKE 3 CAPSULES BY MOUTH EVERY DAY AT BEDTIME 270 capsule 1  . Triamcinolone Acetonide (TRIAMCINOLONE 0.1 % CREAM : EUCERIN) CREA Apply 1 application topically 2 (two) times daily as needed for rash or itching. 500 each 1   Current Facility-Administered Medications on File Prior to Visit  Medication Dose Route Frequency Provider Last Rate Last Admin  . triamcinolone acetonide (KENALOG-40) injection 40 mg  40 mg Intramuscular Once [name], Thetis Schwimmer G, MD       Past Medical History:  Diagnosis Date  . BACK PAIN, CHRONIC 04/18/2009  . BENIGN PROSTATIC HYPERTROPHY, HX OF 01/19/2007  . Carpal tunnel syndrome 03/24/2007  . CORONARY ARTERY DISEASE 01/19/2007  . DEPRESSION, CHRONIC 06/24/2007  . DIABETES MELLITUS 01/19/2007  . DIABETES MELLITUS, TYPE II, WITH NEUROLOGICAL COMPLICATIONS 09/14/158  . Diarrhea 02/23/2007  . HEART MURMUR, SYSTOLIC 04/21/3233  . HERPES SIMPLEX INFECTION 01/19/2007  . HYPERLIPIDEMIA 01/19/2007  . HYPERTENSION 01/19/2007  . Intermediate coronary syndrome ([place]) 08/29/2008  . LEG CRAMPS, NOCTURNAL 10/28/2007  . Leg cramps, sleep related 08/05/2010  . OBSTRUCTIVE SLEEP APNEA 01/19/2007   does not use CPAP  . OSTEOARTHRITIS 06/24/2007  . Other postprocedural status(V45.89) 01/19/2007  . PERCUTANEOUS TRANSLUMINAL CORONARY ANGIOPLASTY, HX OF 01/19/2007  . PLANTAR FASCIITIS, RIGHT 01/19/2007  . SHOULDER PAIN, LEFT 03/24/2007  . SPINAL STENOSIS, LUMBAR 06/03/2009  . TINEA PEDIS  11/15/2009  . URI 01/11/2009   No Known Allergies  Social History   Socioeconomic History  . Marital status: Married    Spouse name: Not on file  . Number of children: Not on file  . Years of education: Not on file  . Highest education level: Not on file  Occupational History  . Occupation: retired    Comment: Truck Research officer, political party  Tobacco Use  . Smoking status: Former Smoker    Quit date: 04/14/1983    Years since quitting: 36.6  . Smokeless tobacco: Never Used  . Tobacco comment: stopped 1985  Vaping Use  . Vaping Use: Never used  Substance and Sexual Activity  . Alcohol use: Yes    Comment: 24 oz daily wine  . Drug use: Not Currently    Types: Marijuana    Comment: occ  . Sexual activity: Not on file  Other Topics Concern  . Not on file  Social History Narrative   Married in  62 - 31yrs, divorced; married 1974 - 1.5 years, divorced; married 1985 1 daughter 47; 67 step daughters; 2 grandchildren.   Social Determinants of Health   Financial Resource Strain:   . Difficulty of Paying Living Expenses:   Food Insecurity:   . Worried About Charity fundraiser in the Last Year:   . Arboriculturist in the Last Year:   Transportation Needs:   . Film/video editor (Medical):   Marland Kitchen Lack of Transportation (Non-Medical):   Physical Activity:   . Days of Exercise per Week:   . Minutes of Exercise per Session:   Stress:   . Feeling of Stress :   Social Connections:   . Frequency of Communication with Friends and Family:   . Frequency of Social Gatherings with Friends and Family:   . Attends Religious Services:   . Active Member of Clubs or Organizations:   . Attends Archivist Meetings:   Marland Kitchen Marital Status:    Vitals:   11/21/19 1051  BP: 136/80  Pulse: 80  Resp: 16  Temp: 98 F (36.7 C)  SpO2: 95%   Body mass index is 30.16 kg/m.  Physical Exam Nursing note reviewed.  Constitutional:      General: He is not in acute distress.    Appearance: He  is well-developed.  HENT:     Head: Normocephalic and atraumatic.  Eyes:     Conjunctiva/sclera: Conjunctivae normal.     Pupils: Pupils are equal, round, and reactive to light.  Cardiovascular:     Rate and Rhythm: Normal rate and regular rhythm.     Heart sounds: Murmur (Dyastolic I-II/VI RUSB,SEM I/VI LUSB) heard.      Comments: DP pulses present. Pulmonary:     Effort: Pulmonary effort is normal. No respiratory distress.     Breath sounds: Normal breath sounds.  Abdominal:     Palpations: Abdomen is soft.     Tenderness: There is no abdominal tenderness.  Musculoskeletal:     Right lower leg: 2+ Pitting Edema present.     Left lower leg: 2+ Pitting Edema present.  Lymphadenopathy:     Cervical: No cervical adenopathy.  Skin:    General: Skin is warm.     Findings: No erythema or rash.  Neurological:     Mental Status: He is alert and oriented to person, place, and time.     Cranial Nerves: No cranial nerve deficit.     Comments: Antalgic gait, assisted by a cane.  Psychiatric:     Comments: Well groomed, good eye contact.    ASSESSMENT AND PLAN:  [name] was seen today for follow-up.  Orders Placed This Encounter  Procedures  . POC HgB A1c   Lab Results  Component Value Date   HGBA1C 6.7 (A) 11/21/2019    Diabetes mellitus type 2 with neurological manifestations ([place]) HgA1C at goal. Decrease Glipizide from 5 mg bid to once daily before biggest meal. Caution with skipping meals, side effects of medication discussed. Annual eye exam and foot care to continue. F/U in 4 months   Hypertension with heart disease BP adequately controlled. No changes in current management. Continue low salt diet. Eye exam is current.  Generalized osteoarthritis of multiple sites We discussed Dx,prognosis,and treatment options. Given his age + CVD, recommend continuing with Tylenol, max 3 g daily. Fall precautions.  Depression, major, recurrent, moderate  (HCC) Stable. He has tried medications in the past but did not seem to  help. Will continue monitoring.  COPD (chronic obstructive pulmonary disease) (HCC) Symptoms are stable. Recommend using Albuterol inh 2 puff q 4-6 hours as needed. Instructed about warning signs.  (HFpEF) heart failure with preserved ejection fraction (HCC) Asymptomatic at the time. Continue Furosemide and Metoprolol. He does not want further f/u echo. He has seen cardiologist in the past, he is not interested in following up. Clearly instructed about warning signs.  Urine incontinence Problem is not well controlled. For now he does not want to go to urologist. Furosemide side effects discussed. Continue Flomax 0.4 mg daily.  41 min visit dedicated to obtaining hx,documentation,performing examination, reviewed of prior imaging results,and discussion of plan.  Return in about 4 months (around 03/22/2020) for HTN,DM II.  Lendy Dittrich G. [name], MD  [facility]. [place] office.   A few things to remember from today's visit:  Type 2 diabetes mellitus with diabetic neuropathy, without long-term current use of insulin ([place]) - Plan: POC HgB A1c, glipiZIDE (GLUCOTROL) 5 MG tablet  Diabetes mellitus type 2 with neurological manifestations (HCC)  Glipizide decreased from 2 tabs daily to 1 tab 10 min before the biggest meal. Tylenol max 3 g daily. Fall precautions. Use you Albuterol when feeling short of breath. Let me know if you change up your mind about echo or if you want to follow with heart doctor.  If you need refills please call your pharmacy. Do not use My Chart to request refills or for acute issues that need immediate attention.   Please be sure medication list is accurate. If a new problem present, please set up appointment sooner than planned today.  If we have ordered labs or studies at this visit, it can take up to 1-2 weeks for results and processing. IF results require follow up or  explanation, we will call you with instructions. Clinically stable results will be released to your [facility]. If you have not heard from [place] or cannot find your results in [facility] in 2 weeks please contact our office at [phone].  If you are not yet signed up for [facility], please consider signing up.

## 2019-11-21 NOTE — Assessment & Plan Note (Addendum)
HgA1C at goal. Decrease Glipizide from 5 mg bid to once daily before biggest meal. Caution with skipping meals, side effects of medication discussed. Annual eye exam and foot care to continue. F/U in 4 months

## 2019-11-21 NOTE — Assessment & Plan Note (Addendum)
Symptoms are stable. Recommend using Albuterol inh 2 puff q 4-6 hours as needed. Instructed about warning signs.

## 2019-11-21 NOTE — Assessment & Plan Note (Signed)
Asymptomatic at the time. Continue Furosemide and Metoprolol. He does not want further f/u echo. He has seen cardiologist in the past, he is not interested in following up. Clearly instructed about warning signs.

## 2019-11-21 NOTE — Assessment & Plan Note (Signed)
Stable. He has tried medications in the past but did not seem to help. Will continue monitoring.

## 2019-11-21 NOTE — Assessment & Plan Note (Signed)
BP adequately controlled. No changes in current management. Continue low-salt diet. Eye exam is current. 

## 2019-12-06 ENCOUNTER — Other Ambulatory Visit: Payer: Self-pay

## 2019-12-06 ENCOUNTER — Ambulatory Visit (INDEPENDENT_AMBULATORY_CARE_PROVIDER_SITE_OTHER): Payer: Medicare HMO

## 2019-12-06 DIAGNOSIS — Z0000 Encounter for general adult medical examination without abnormal findings: Secondary | ICD-10-CM | POA: Diagnosis not present

## 2019-12-06 NOTE — Progress Notes (Signed)
Virtual Visit via Telephone Note  I connected with  [name] on 12/06/19 at  1:45 PM EDT by telephone and verified that I am speaking with the correct person using two identifiers.  Medicare Annual Wellness visit completed telephonically due to Covid-19 pandemic.   Persons participating in this call: This Health Coach and this patient.  Location: Patient: Home Provider: Office   I discussed the limitations, risks, security and privacy concerns of performing an evaluation and management service by telephone and the availability of in person appointments. The patient expressed understanding and agreed to proceed.  Unable to perform video visit due to video visit attempted and failed and/or patient does not have video capability.   Some vital signs may be absent or patient reported.   Willette Brace, LPN    Subjective:   [name] is a 86 y.o. male who presents for Medicare Annual/Subsequent preventive examination.  Review of Systems     Cardiac Risk Factors include: hypertension;diabetes mellitus;male gender;obesity (BMI >30kg/m2)     Objective:    Today's Vitals   12/06/19 1346  PainSc: 5    There is no height or weight on file to calculate BMI.  Advanced Directives 12/06/2019 03/25/2016 01/29/2016 01/01/2016 04/24/2015 05/02/2013 04/25/2013  Does Patient Have a Medical Advance Directive? Yes Yes Yes Yes Yes Patient has advance directive, copy in chart Patient has advance directive, copy not in chart  Type of Advance Directive [place];Living will Living will Living will Living will - - Living will  Copy of [place] in Chart? No - copy requested - - - No - copy requested - Copy requested from family    Current Medications (verified) Outpatient Encounter Medications as of 12/06/2019  Medication Sig  . Cyanocobalamin (B-12) 1000 MCG TABS Take 1,000 mcg by mouth daily.  . finasteride (PROSCAR) 5 MG tablet TAKE 1 TABLET BY MOUTH  EVERY DAY  . furosemide (LASIX) 40 MG tablet TAKE 0.5 TABLET BY MOUTH 2 TIMES DAILY. YOU MAY TAKE 1 EXTRA IN THE EVENING IF YOU HAVE SOB, LEE, AND WEIGHT GAIN >3LBS IN 24 HOURS  . glipiZIDE (GLUCOTROL) 5 MG tablet TAKE 1 TABLET BY MOUTH BEFORE your biggest meal.  . Lancet Devices (LANCET DEVICE WITH EJECTOR) MISC 1 Device by Does not apply route daily.  . metoprolol tartrate (LOPRESSOR) 25 MG tablet Take 1 tablet (25 mg total) by mouth daily.  Glory Rosebush Delica Lancets 93A MISC USE TO CHECK BLOOD SUGAR DAILY AND PRN  . ONETOUCH ULTRA test strip USE TO CHECK BLOOD SUGAR DAILY E11.9  . oxcarbazepine (TRILEPTAL) 600 MG tablet Take 0.5 tablets (300 mg total) by mouth 2 (two) times daily.  Marland Kitchen rOPINIRole (REQUIP) 0.5 MG tablet TAKE 1 TABLET BY MOUTH AT BEDTIME.  Marland Kitchen Spacer/Aero-Holding Chambers (AEROCHAMBER PLUS) inhaler Use as instructed to use with inahaler.  . tamsulosin (FLOMAX) 0.4 MG CAPS capsule TAKE 1 CAPSULE BY MOUTH EVERY DAY AFTER SUPPER  . tizanidine (ZANAFLEX) 2 MG capsule TAKE 3 CAPSULES BY MOUTH EVERY DAY AT BEDTIME  . albuterol (VENTOLIN HFA) 108 (90 Base) MCG/ACT inhaler Inhale 2 puffs into the lungs every 6 (six) hours as needed. (Patient not taking: Reported on 12/06/2019)  . Triamcinolone Acetonide (TRIAMCINOLONE 0.1 % CREAM : EUCERIN) CREA Apply 1 application topically 2 (two) times daily as needed for rash or itching. (Patient not taking: Reported on 12/06/2019)   Facility-Administered Encounter Medications as of 12/06/2019  Medication  . triamcinolone acetonide (KENALOG-40) injection  40 mg    Allergies (verified) Patient has no known allergies.   History: Past Medical History:  Diagnosis Date  . BACK PAIN, CHRONIC 04/18/2009  . BENIGN PROSTATIC HYPERTROPHY, HX OF 01/19/2007  . Carpal tunnel syndrome 03/24/2007  . CORONARY ARTERY DISEASE 01/19/2007  . DEPRESSION, CHRONIC 06/24/2007  . DIABETES MELLITUS 01/19/2007  . DIABETES MELLITUS, TYPE II, WITH NEUROLOGICAL COMPLICATIONS  04/18/1094  . Diarrhea 02/23/2007  . HEART MURMUR, SYSTOLIC 0/45/4098  . HERPES SIMPLEX INFECTION 01/19/2007  . HYPERLIPIDEMIA 01/19/2007  . HYPERTENSION 01/19/2007  . Intermediate coronary syndrome ([place]) 08/29/2008  . LEG CRAMPS, NOCTURNAL 10/28/2007  . Leg cramps, sleep related 08/05/2010  . OBSTRUCTIVE SLEEP APNEA 01/19/2007   does not use CPAP  . OSTEOARTHRITIS 06/24/2007  . Other postprocedural status(V45.89) 01/19/2007  . PERCUTANEOUS TRANSLUMINAL CORONARY ANGIOPLASTY, HX OF 01/19/2007  . PLANTAR FASCIITIS, RIGHT 01/19/2007  . SHOULDER PAIN, LEFT 03/24/2007  . SPINAL STENOSIS, LUMBAR 06/03/2009  . TINEA PEDIS 11/15/2009  . URI 01/11/2009   Past Surgical History:  Procedure Laterality Date  . arthroscopy, knee of hx    . CARDIAC CATHETERIZATION    . CATARACT EXTRACTION Left   . COLONOSCOPY    . inguinal herniorrhapy, hx    . NASAL SINUS SURGERY    . percutaneous transluminal coronary angioplasty, hx of  2004   Dr. Lyndel Safe  . plastic joint in thumb    . PTCA/stent  2004  . TONSILLECTOMY    . VASECTOMY     Family History  Problem Relation Age of Onset  . Arthritis Sister   . Diabetes Other   . Coronary artery disease Other    Social History   Socioeconomic History  . Marital status: Married    Spouse name: Not on file  . Number of children: Not on file  . Years of education: Not on file  . Highest education level: Not on file  Occupational History  . Occupation: retired    Comment: Truck Research officer, political party  Tobacco Use  . Smoking status: Former Smoker    Quit date: 04/14/1983    Years since quitting: 36.6  . Smokeless tobacco: Never Used  . Tobacco comment: stopped 1985  Vaping Use  . Vaping Use: Never used  Substance and Sexual Activity  . Alcohol use: Yes    Comment: 24 oz daily wine  . Drug use: Not Currently    Types: Marijuana    Comment: occ  . Sexual activity: Not on file  Other Topics Concern  . Not on file  Social History Narrative   Married in 1959 - 29yrs,  divorced; married 1974 - 1.5 years, divorced; married 1985 1 daughter 28; 68 step daughters; 2 grandchildren.   Social Determinants of Health   Financial Resource Strain: Low Risk   . Difficulty of Paying Living Expenses: Not hard at all  Food Insecurity: No Food Insecurity  . Worried About Charity fundraiser in the Last Year: Never true  . Ran Out of Food in the Last Year: Never true  Transportation Needs: No Transportation Needs  . Lack of Transportation (Medical): No  . Lack of Transportation (Non-Medical): No  Physical Activity: Inactive  . Days of Exercise per Week: 0 days  . Minutes of Exercise per Session: 0 min  Stress: No Stress Concern Present  . Feeling of Stress : Only a little  Social Connections: Moderately Isolated  . Frequency of Communication with Friends and Family: More than three times a week  . Frequency  of Social Gatherings with Friends and Family: Twice a week  . Attends Religious Services: Never  . Active Member of Clubs or Organizations: No  . Attends Archivist Meetings: Never  . Marital Status: Married    Tobacco Counseling Counseling given: Not Answered Comment: stopped 1985   Clinical Intake:  Pre-visit preparation completed: Yes  Pain : 0-10 (from cutting grass) Pain Score: 5  Pain Type: Acute pain Pain Location: Leg (and arms) Pain Descriptors / Indicators: Aching Pain Onset: Yesterday Pain Frequency: Other (Comment) (from cutting grass and house chores)     BMI - recorded: 30.17 Nutritional Status: BMI > 30  Obese Nutritional Risks: None Diabetes: Yes CBG done?: No Did pt. bring in CBG monitor from home?: No  How often do you need to have someone help you when you read instructions, pamphlets, or other written materials from your doctor or pharmacy?: 1 - Never  Diabetic?Yes  Interpreter Needed?: No  Information entered by :: Charlott Rakes, LPN   Activities of Daily Living In your present state of health, do  you have any difficulty performing the following activities: 12/06/2019 11/21/2019  Hearing? Y N  Comment wears earing aids -  Vision? Y N  Comment concerned about blindeness in DM history, will follow up with eye Dr -  Difficulty concentrating or making decisions? Y N  Comment forgetting at times -  Walking or climbing stairs? Y N  Comment a little bit realted to knees -  Dressing or bathing? Y N  Comment bathing attimes has a tub to climb in -  Doing errands, shopping? N N  Preparing Food and eating ? N -  Using the Toilet? N -  In the past six months, have you accidently leaked urine? Y -  Comment wear depends -  Do you have problems with loss of bowel control? Y -  Comment Wears depends -  Managing your Medications? N -  Managing your Finances? N -  Housekeeping or managing your Housekeeping? N -  Some recent data might be hidden    Patient Care Team: [name], [name], MD as PCP - General (Family Medicine)  Indicate any recent Medical Services you may have received from other than [facility] providers in the past year (date may be approximate).     Assessment:   This is a routine wellness examination for [facility].  Hearing/Vision screen  Hearing Screening   125Hz  250Hz  500Hz  1000Hz  2000Hz  3000Hz  4000Hz  6000Hz  8000Hz   Right ear:           Left ear:           Comments: Pt wears hearing aids  Vision Screening Comments: Pt follows up annually with eye Dr in [place]   Dietary issues and exercise activities discussed: Current Exercise Habits: The patient does not participate in regular exercise at present, Exercise limited by: orthopedic condition(s)  Goals    . Patient Stated     Stay healthy      Depression Screen PHQ 2/9 Scores 12/06/2019 11/08/2018 10/09/2016 01/01/2016  PHQ - 2 Score 1 4 6 3   PHQ- 9 Score - 16 18 10     Fall Risk Fall Risk  12/06/2019 11/08/2018 06/21/2018 09/21/2017 04/01/2017  Falls in the past year? 0 1 1 No No  Number falls in past yr: 0 0 0 - -    Comment - Right foot fracture. - - -  Injury with Fall? 0 1 1 - -  Risk for fall due to :  Impaired balance/gait;Impaired vision Impaired balance/gait;Impaired mobility;Orthopedic patient - - -  Follow up Falls prevention discussed Education provided - - -    Any stairs in or around the home? Yes  If so, are there any without handrails? No  Home free of loose throw rugs in walkways, pet beds, electrical cords, etc? Yes  Adequate lighting in your home to reduce risk of falls? Yes   ASSISTIVE DEVICES UTILIZED TO PREVENT FALLS:  Life alert? No  Use of a cane, walker or w/c? Yes  Grab bars in the bathroom? No  Shower chair or bench in shower? No  Elevated toilet seat or a handicapped toilet? Yes   TIMED UP AND GO:  Was the test performed? No .    Cognitive Function:     6CIT Screen 12/06/2019  What Year? 0 points  What month? 0 points  Count back from 20 0 points  Months in reverse 0 points  Repeat phrase 0 points    Immunizations Immunization History  Administered Date(s) Administered  . Fluad Quad(high Dose 65+) 01/30/2019  . Influenza Split 01/12/2011, 01/18/2012  . Influenza Whole 02/23/2007, 02/01/2008, 01/11/2009, 01/16/2010  . Influenza, High Dose Seasonal PF 03/02/2016, 01/15/2017, 12/21/2017  . Influenza-Unspecified 01/11/2013, 12/12/2013  . PFIZER SARS-COV-2 Vaccination 05/02/2019, 05/22/2019  . Pneumococcal Conjugate-13 01/26/2013  . Pneumococcal Polysaccharide-23 07/15/1993  . Td 04/04/2009  . Zoster 02/12/2012    TDAP status: Due, Education has been provided regarding the importance of this vaccine. Advised may receive this vaccine at local pharmacy or Health Dept. Aware to provide a copy of the vaccination record if obtained from local pharmacy or Health Dept. Verbalized acceptance and understanding. Flu Vaccine status: Up to date Pneumococcal vaccine status: Up to date Covid-19 vaccine status: Completed vaccines  Qualifies for Shingles Vaccine? Yes    Zostavax completed Yes   Shingrix Completed?: No.    Education has been provided regarding the importance of this vaccine. Patient has been advised to call insurance company to determine out of pocket expense if they have not yet received this vaccine. Advised may also receive vaccine at local pharmacy or Health Dept. Verbalized acceptance and understanding.  Screening Tests Health Maintenance  Topic Date Due  . URINE MICROALBUMIN  01/30/2020  . INFLUENZA VACCINE  02/20/2020 (Originally 11/12/2019)  . FOOT EXAM  01/30/2020  . HEMOGLOBIN A1C  05/23/2020  . OPHTHALMOLOGY EXAM  07/02/2020  . COVID-19 Vaccine  Completed  . TETANUS/TDAP  Discontinued  . PNA vac Low Risk Adult  Discontinued    Health Maintenance  Health Maintenance Due  Topic Date Due  . URINE MICROALBUMIN  01/30/2020    Colorectal cancer screening: No longer required.    Additional Screening:    Vision Screening: Recommended annual ophthalmology exams for early detection of glaucoma and other disorders of the eye. Is the patient up to date with their annual eye exam?  Yes  Who is the provider or what is the name of the office in which the patient attends annual eye exams? Dr in [place] unsure of name    Dental Screening: Recommended annual dental exams for proper oral hygiene  Community Resource Referral / Chronic Care Management: CRR required this visit?  No   CCM required this visit?  No     Plan:     I have personally reviewed and noted the following in the patient's chart:   . Medical and social history . Use of alcohol, tobacco or illicit drugs  . Current medications and supplements .  Functional ability and status . Nutritional status . Physical activity . Advanced directives . List of other physicians . Hospitalizations, surgeries, and ER visits in previous 12 months . Vitals . Screenings to include cognitive, depression, and falls . Referrals and appointments  In addition, I have  reviewed and discussed with patient certain preventive protocols, quality metrics, and best practice recommendations. A written personalized care plan for preventive services as well as general preventive health recommendations were provided to patient.     Willette Brace, LPN   09/16/7701   Nurse Notes: None

## 2019-12-06 NOTE — Patient Instructions (Addendum)
[name] , Thank you for taking time to come for your Medicare Wellness Visit. I appreciate your ongoing commitment to your health goals. Please review the following plan we discussed and let me know if I can assist you in the future.   Screening recommendations/referrals: Colonoscopy: No longer required  Recommended yearly ophthalmology/optometry visit for glaucoma screening and checkup Recommended yearly dental visit for hygiene and checkup  Vaccinations: Influenza vaccine: Up to date Pneumococcal vaccine: Up to date Tdap vaccine: Due and Discussed Shingles vaccine: Shingrix discussed. Please contact your pharmacy for coverage information. Received Zoster 02/12/12   Covid-19: Completed 1/19 & 05/22/19  Advanced directives: Please bring a copy of your health care power of attorney and living will to the office at your convenience.  Conditions/risks identified: Stay Healthy  Next appointment: Follow up in one year for your annual wellness visit.   Preventive Care 84 Years and Older, Male Preventive care refers to lifestyle choices and visits with your health care provider that can promote health and wellness. What does preventive care include?  A yearly physical exam. This is also called an annual well check.  Dental exams once or twice a year.  Routine eye exams. Ask your health care provider how often you should have your eyes checked.  Personal lifestyle choices, including:  Daily care of your teeth and gums.  Regular physical activity.  Eating a healthy diet.  Avoiding tobacco and drug use.  Limiting alcohol use.  Practicing safe sex.  Taking low doses of aspirin every day.  Taking vitamin and mineral supplements as recommended by your health care provider. What happens during an annual well check? The services and screenings done by your health care provider during your annual well check will depend on your age, overall health, lifestyle risk factors, and family  history of disease. Counseling  Your health care provider may ask you questions about your:  Alcohol use.  Tobacco use.  Drug use.  Emotional well-being.  Home and relationship well-being.  Sexual activity.  Eating habits.  History of falls.  Memory and ability to understand (cognition).  Work and work Statistician. Screening  You may have the following tests or measurements:  Height, weight, and BMI.  Blood pressure.  Lipid and cholesterol levels. These may be checked every 5 years, or more frequently if you are over 84 years old.  Skin check.  Lung cancer screening. You may have this screening every year starting at age 84 if you have a 30-pack-year history of smoking and currently smoke or have quit within the past 15 years.  Fecal occult blood test (FOBT) of the stool. You may have this test every year starting at age 84.  Flexible sigmoidoscopy or colonoscopy. You may have a sigmoidoscopy every 5 years or a colonoscopy every 10 years starting at age 84.  Prostate cancer screening. Recommendations will vary depending on your family history and other risks.  Hepatitis C blood test.  Hepatitis B blood test.  Sexually transmitted disease (STD) testing.  Diabetes screening. This is done by checking your blood sugar (glucose) after you have not eaten for a while (fasting). You may have this done every 1-3 years.  Abdominal aortic aneurysm (AAA) screening. You may need this if you are a current or former smoker.  Osteoporosis. You may be screened starting at age 84 if you are at high risk. Talk with your health care provider about your test results, treatment options, and if necessary, the need for more tests. Vaccines  Your health care provider may recommend certain vaccines, such as:  Influenza vaccine. This is recommended every year.  Tetanus, diphtheria, and acellular pertussis (Tdap, Td) vaccine. You may need a Td booster every 10 years.  Zoster vaccine.  You may need this after age 84.  Pneumococcal 13-valent conjugate (PCV13) vaccine. One dose is recommended after age 84.  Pneumococcal polysaccharide (PPSV23) vaccine. One dose is recommended after age 84. Talk to your health care provider about which screenings and vaccines you need and how often you need them. This information is not intended to replace advice given to you by your health care provider. Make sure you discuss any questions you have with your health care provider. Document Released: 04/26/2015 Document Revised: 12/18/2015 Document Reviewed: 01/29/2015 Elsevier Interactive Patient Education  2017 [place] Prevention in the Home Falls can cause injuries. They can happen to people of all ages. There are many things you can do to make your home safe and to help prevent falls. What can I do on the outside of my home?  Regularly fix the edges of walkways and driveways and fix any cracks.  Remove anything that might make you trip as you walk through a door, such as a raised step or threshold.  Trim any bushes or trees on the path to your home.  Use bright outdoor lighting.  Clear any walking paths of anything that might make someone trip, such as rocks or tools.  Regularly check to see if handrails are loose or broken. Make sure that both sides of any steps have handrails.  Any raised decks and porches should have guardrails on the edges.  Have any leaves, snow, or ice cleared regularly.  Use sand or salt on walking paths during winter.  Clean up any spills in your garage right away. This includes oil or grease spills. What can I do in the bathroom?  Use night lights.  Install grab bars by the toilet and in the tub and shower. Do not use towel bars as grab bars.  Use non-skid mats or decals in the tub or shower.  If you need to sit down in the shower, use a plastic, non-slip stool.  Keep the floor dry. Clean up any water that spills on the floor as soon  as it happens.  Remove soap buildup in the tub or shower regularly.  Attach bath mats securely with double-sided non-slip rug tape.  Do not have throw rugs and other things on the floor that can make you trip. What can I do in the bedroom?  Use night lights.  Make sure that you have a light by your bed that is easy to reach.  Do not use any sheets or blankets that are too big for your bed. They should not hang down onto the floor.  Have a firm chair that has side arms. You can use this for support while you get dressed.  Do not have throw rugs and other things on the floor that can make you trip. What can I do in the kitchen?  Clean up any spills right away.  Avoid walking on wet floors.  Keep items that you use a lot in easy-to-reach places.  If you need to reach something above you, use a strong step stool that has a grab bar.  Keep electrical cords out of the way.  Do not use floor polish or wax that makes floors slippery. If you must use wax, use non-skid floor wax.  Do  not have throw rugs and other things on the floor that can make you trip. What can I do with my stairs?  Do not leave any items on the stairs.  Make sure that there are handrails on both sides of the stairs and use them. Fix handrails that are broken or loose. Make sure that handrails are as long as the stairways.  Check any carpeting to make sure that it is firmly attached to the stairs. Fix any carpet that is loose or worn.  Avoid having throw rugs at the top or bottom of the stairs. If you do have throw rugs, attach them to the floor with carpet tape.  Make sure that you have a light switch at the top of the stairs and the bottom of the stairs. If you do not have them, ask someone to add them for you. What else can I do to help prevent falls?  Wear shoes that:  Do not have high heels.  Have rubber bottoms.  Are comfortable and fit you well.  Are closed at the toe. Do not wear sandals.  If  you use a stepladder:  Make sure that it is fully opened. Do not climb a closed stepladder.  Make sure that both sides of the stepladder are locked into place.  Ask someone to hold it for you, if possible.  Clearly mark and make sure that you can see:  Any grab bars or handrails.  First and last steps.  Where the edge of each step is.  Use tools that help you move around (mobility aids) if they are needed. These include:  Canes.  Walkers.  Scooters.  Crutches.  Turn on the lights when you go into a dark area. Replace any light bulbs as soon as they burn out.  Set up your furniture so you have a clear path. Avoid moving your furniture around.  If any of your floors are uneven, fix them.  If there are any pets around you, be aware of where they are.  Review your medicines with your doctor. Some medicines can make you feel dizzy. This can increase your chance of falling. Ask your doctor what other things that you can do to help prevent falls. This information is not intended to replace advice given to you by your health care provider. Make sure you discuss any questions you have with your health care provider. Document Released: 01/24/2009 Document Revised: 09/05/2015 Document Reviewed: 05/04/2014 Elsevier Interactive Patient Education  2017 [employer].

## 2020-01-02 DIAGNOSIS — H02831 Dermatochalasis of right upper eyelid: Secondary | ICD-10-CM | POA: Diagnosis not present

## 2020-01-02 DIAGNOSIS — H43813 Vitreous degeneration, bilateral: Secondary | ICD-10-CM | POA: Diagnosis not present

## 2020-01-02 DIAGNOSIS — H527 Unspecified disorder of refraction: Secondary | ICD-10-CM | POA: Diagnosis not present

## 2020-01-02 DIAGNOSIS — E119 Type 2 diabetes mellitus without complications: Secondary | ICD-10-CM | POA: Diagnosis not present

## 2020-01-02 DIAGNOSIS — H02834 Dermatochalasis of left upper eyelid: Secondary | ICD-10-CM | POA: Diagnosis not present

## 2020-01-02 DIAGNOSIS — H40003 Preglaucoma, unspecified, bilateral: Secondary | ICD-10-CM | POA: Diagnosis not present

## 2020-01-26 ENCOUNTER — Other Ambulatory Visit: Payer: Self-pay | Admitting: Family Medicine

## 2020-01-26 DIAGNOSIS — N401 Enlarged prostate with lower urinary tract symptoms: Secondary | ICD-10-CM

## 2020-02-04 ENCOUNTER — Other Ambulatory Visit: Payer: Self-pay | Admitting: Family Medicine

## 2020-02-26 ENCOUNTER — Other Ambulatory Visit: Payer: Self-pay | Admitting: Neurology

## 2020-02-26 ENCOUNTER — Encounter: Payer: Self-pay | Admitting: Family Medicine

## 2020-02-27 NOTE — Telephone Encounter (Signed)
Rx(s) sent to pharmacy electronically.  

## 2020-03-01 ENCOUNTER — Other Ambulatory Visit: Payer: Self-pay | Admitting: Family Medicine

## 2020-03-01 MED ORDER — ACCU-CHEK AVIVA CONNECT W/DEVICE KIT
1.0000 | PACK | Freq: Every day | 0 refills | Status: AC
Start: 2020-03-01 — End: ?

## 2020-03-12 ENCOUNTER — Other Ambulatory Visit: Payer: Self-pay | Admitting: Neurology

## 2020-03-18 NOTE — Progress Notes (Signed)
Assessment/Plan:   1.  Lower extremity cramping  -On Trileptal, 600 mg, half tablet twice per day.  He is currently on q day and told him to take bid and see if it will help more.    -Alcohol likely contributing.  Discussed with the patient that alcohol may contribute  -discussed tonic water (not mixed with alcohol)  2.  Tremor  -No medication needed.  No evidence of Parkinson's.  3.  Diabetic peripheral neuropathy  -Etiology of gait/balance issues.  -discussed physical therapy and he is agreeable.  He wants to go to [facility]  4.  ?RLS  -pt started on requip by pcp recently.  He doesn't think that it helped.  -will check iron/ferritin  5.  F/u 1 year Subjective:   [name] was seen today in follow up for lower extremity cramping.  Wife with patient and supplements the history.  My previous records as well as any outside records available were reviewed prior to todays visit.  Pt is currently on Trileptal. Pt states that sometimes he takes 1/2 po bid and sometimes he takes 1 tablet at bedtime and none in the day.  He states that he still has some cramping but cannot state that it is better or worse than prior to meds - "my memory is bad."   Pt denies falls.  In regards to alcohol use, the patient states that "I was thinking that would help."   pt denies lightheadedness, near syncope.  No hallucinations.  Mood has been good.   PREVIOUS MEDICATIONS: Tizanidine and baclofen without relief; Dilantin with some relief  CURRENT MEDICATIONS:  Outpatient Encounter Medications as of 03/21/2020  Medication Sig  . albuterol (VENTOLIN HFA) 108 (90 Base) MCG/ACT inhaler Inhale 2 puffs into the lungs every 6 (six) hours as needed.  . Blood Glucose Monitoring Suppl (ACCU-CHEK AVIVA CONNECT) w/Device KIT 1 Device by Does not apply route daily.  . Cyanocobalamin (B-12) 1000 MCG TABS Take 1,000 mcg by mouth daily.  Marland Kitchen docusate sodium (COLACE) 100 MG capsule Take 100 mg by mouth 2 (two) times  daily.  . finasteride (PROSCAR) 5 MG tablet TAKE 1 TABLET BY MOUTH EVERY DAY  . furosemide (LASIX) 40 MG tablet TAKE 0.5 TABLET BY MOUTH 2 TIMES DAILY. YOU MAY TAKE 1 EXTRA IN THE EVENING IF YOU HAVE SOB, LEE, AND WEIGHT GAIN >3LBS IN 24 HOURS  . glipiZIDE (GLUCOTROL) 5 MG tablet TAKE 1 TABLET BY MOUTH BEFORE your biggest meal.  . Lancet Devices (LANCET DEVICE WITH EJECTOR) MISC 1 Device by Does not apply route daily.  . metoprolol tartrate (LOPRESSOR) 25 MG tablet Take 1 tablet (25 mg total) by mouth daily.  Glory Rosebush Delica Lancets 94R MISC USE TO CHECK BLOOD SUGAR DAILY AND PRN  . ONETOUCH ULTRA test strip USE TO CHECK BLOOD SUGAR DAILY E11.9  . oxcarbazepine (TRILEPTAL) 600 MG tablet TAKE HALF TABLET BY MOUTH TWICE DAILY (Patient taking differently: Take 300 mg by mouth 2 (two) times daily.)  . rOPINIRole (REQUIP) 0.5 MG tablet TAKE 1 TABLET BY MOUTH AT BEDTIME.  Marland Kitchen Spacer/Aero-Holding Chambers (AEROCHAMBER PLUS) inhaler Use as instructed to use with inahaler.  . tamsulosin (FLOMAX) 0.4 MG CAPS capsule TAKE 1 CAPSULE BY MOUTH EVERY DAY AFTER SUPPER  . tizanidine (ZANAFLEX) 2 MG capsule TAKE 3 CAPSULES BY MOUTH EVERY DAY AT BEDTIME  . Triamcinolone Acetonide (TRIAMCINOLONE 0.1 % CREAM : EUCERIN) CREA Apply 1 application topically 2 (two) times daily as needed for rash or  itching.  . valsartan (DIOVAN) 40 MG tablet Take 40 mg by mouth daily.   Facility-Administered Encounter Medications as of 03/21/2020  Medication  . triamcinolone acetonide (KENALOG-40) injection 40 mg     Objective:   PHYSICAL EXAMINATION:    VITALS:   Vitals:   03/21/20 1309  BP: (!) 172/91  Pulse: (!) 58  SpO2: 97%  Weight: 168 lb (76.2 kg)  Height: _0  (1.6 m)    GEN:  The patient appears stated age and is in NAD. HEENT:  Normocephalic, atraumatic.  The mucous membranes are moist. The superficial temporal arteries are without ropiness or tenderness. CV:  RRR Lungs:  CTAB Neck/HEME:  There are no  carotid bruits bilaterally.  Neurological examination:  Orientation: The patient is alert and oriented x3. Cranial nerves: There is good facial symmetry.The speech is fluent and clear. Soft palate rises symmetrically and there is no tongue deviation. Hearing is intact to conversational tone. Sensation: Sensation is intact to light touch throughout Motor: Strength is at least antigravity x4.  Movement examination: Tone: There is normal tone in the UE/LE Abnormal movements:  no tremor.  No myoclonus.  No asterixis.   Coordination:  There is no decremation with RAM's. Gait and Station: The patient has no difficulty arising out of a deep-seated chair without the use of the hands. The patient's stride length is good.    I have reviewed and interpreted the following labs independently   Chemistry      Component Value Date/Time   NA 137 08/15/2019 1155   K 3.9 08/15/2019 1155   CL 103 08/15/2019 1155   CO2 28 08/15/2019 1155   BUN 18 08/15/2019 1155   CREATININE 0.65 08/15/2019 1155   CREATININE 0.81 01/15/2017 1555      Component Value Date/Time   CALCIUM 9.1 08/15/2019 1155   ALKPHOS 81 03/29/2018 1317   AST 17 03/29/2018 1317   ALT 18 03/29/2018 1317   BILITOT 0.5 03/29/2018 1317     Lab Results  Component Value Date   HGBA1C 6.7 (A) 11/21/2019   No results found for: FERRITIN  No results found for: IRON, TIBC, FERRITIN  Lab Results  Component Value Date   WBC 4.2 04/23/2017   HGB 15.0 04/23/2017   HCT 44.4 04/23/2017   MCV 89.6 04/23/2017   PLT 133.0 (L) 04/23/2017     Total time spent on today's visit was 30 minutes, including both face-to-face time and nonface-to-face time.  Time included that spent on review of records (prior notes available to me/labs/imaging if pertinent), discussing treatment and goals, answering patient's questions and coordinating care.  Cc:  [name], [name], MD

## 2020-03-19 DIAGNOSIS — R69 Illness, unspecified: Secondary | ICD-10-CM | POA: Diagnosis not present

## 2020-03-21 ENCOUNTER — Encounter: Payer: Self-pay | Admitting: Neurology

## 2020-03-21 ENCOUNTER — Ambulatory Visit: Payer: Medicare HMO | Admitting: Neurology

## 2020-03-21 ENCOUNTER — Other Ambulatory Visit (INDEPENDENT_AMBULATORY_CARE_PROVIDER_SITE_OTHER): Payer: Medicare HMO

## 2020-03-21 ENCOUNTER — Other Ambulatory Visit: Payer: Self-pay

## 2020-03-21 VITALS — BP 172/91 | HR 58 | Ht 63.0 in | Wt 168.0 lb

## 2020-03-21 DIAGNOSIS — G2581 Restless legs syndrome: Secondary | ICD-10-CM

## 2020-03-21 DIAGNOSIS — D509 Iron deficiency anemia, unspecified: Secondary | ICD-10-CM

## 2020-03-21 DIAGNOSIS — R251 Tremor, unspecified: Secondary | ICD-10-CM

## 2020-03-21 DIAGNOSIS — G629 Polyneuropathy, unspecified: Secondary | ICD-10-CM | POA: Diagnosis not present

## 2020-03-21 LAB — CBC
HCT: 42 % (ref 39.0–52.0)
Hemoglobin: 14.2 g/dL (ref 13.0–17.0)
MCHC: 33.7 g/dL (ref 30.0–36.0)
MCV: 91.7 fl (ref 78.0–100.0)
Platelets: 169 10*3/uL (ref 150.0–400.0)
RBC: 4.58 Mil/uL (ref 4.22–5.81)
RDW: 13.2 % (ref 11.5–15.5)
WBC: 5.5 10*3/uL (ref 4.0–10.5)

## 2020-03-21 NOTE — Patient Instructions (Signed)
Your provider has requested that you have labwork completed today. Please go to [facility] ([id number]) on the second floor of this building before leaving the office today. You do not need to check in. If you are not called within 15 minutes please check with the front desk.    Someone from [facility] will be in touch with you.

## 2020-03-21 NOTE — Addendum Note (Signed)
Addended by: Ulice Brilliant T on: 03/21/2020 01:39 PM   Modules accepted: Orders

## 2020-03-21 NOTE — Addendum Note (Signed)
Addended by: Ulice Brilliant T on: 03/21/2020 01:34 PM   Modules accepted: Orders

## 2020-03-22 LAB — IRON,TIBC AND FERRITIN PANEL
%SAT: 38 % (calc) (ref 20–48)
Ferritin: 148 ng/mL (ref 24–380)
Iron: 140 ug/dL (ref 50–180)
TIBC: 366 mcg/dL (calc) (ref 250–425)

## 2020-04-22 ENCOUNTER — Other Ambulatory Visit: Payer: Self-pay | Admitting: Family Medicine

## 2020-04-22 DIAGNOSIS — E1149 Type 2 diabetes mellitus with other diabetic neurological complication: Secondary | ICD-10-CM

## 2020-04-22 DIAGNOSIS — R252 Cramp and spasm: Secondary | ICD-10-CM

## 2020-04-23 DIAGNOSIS — E261 Secondary hyperaldosteronism: Secondary | ICD-10-CM | POA: Diagnosis not present

## 2020-04-23 DIAGNOSIS — I2510 Atherosclerotic heart disease of native coronary artery without angina pectoris: Secondary | ICD-10-CM | POA: Diagnosis not present

## 2020-04-23 DIAGNOSIS — N400 Enlarged prostate without lower urinary tract symptoms: Secondary | ICD-10-CM | POA: Diagnosis not present

## 2020-04-23 DIAGNOSIS — M1990 Unspecified osteoarthritis, unspecified site: Secondary | ICD-10-CM | POA: Diagnosis not present

## 2020-04-23 DIAGNOSIS — I509 Heart failure, unspecified: Secondary | ICD-10-CM | POA: Diagnosis not present

## 2020-04-23 DIAGNOSIS — E1142 Type 2 diabetes mellitus with diabetic polyneuropathy: Secondary | ICD-10-CM | POA: Diagnosis not present

## 2020-04-23 DIAGNOSIS — G8929 Other chronic pain: Secondary | ICD-10-CM | POA: Diagnosis not present

## 2020-04-23 DIAGNOSIS — R197 Diarrhea, unspecified: Secondary | ICD-10-CM | POA: Diagnosis not present

## 2020-04-23 DIAGNOSIS — G2581 Restless legs syndrome: Secondary | ICD-10-CM | POA: Diagnosis not present

## 2020-04-23 DIAGNOSIS — I110 Hypertensive heart disease with heart failure: Secondary | ICD-10-CM | POA: Diagnosis not present

## 2020-04-25 ENCOUNTER — Other Ambulatory Visit: Payer: Self-pay

## 2020-04-26 ENCOUNTER — Encounter: Payer: Self-pay | Admitting: Family Medicine

## 2020-04-26 ENCOUNTER — Ambulatory Visit (INDEPENDENT_AMBULATORY_CARE_PROVIDER_SITE_OTHER): Payer: Medicare HMO | Admitting: Family Medicine

## 2020-04-26 VITALS — BP 126/70 | HR 78 | Resp 16 | Ht 63.0 in | Wt 166.2 lb

## 2020-04-26 DIAGNOSIS — E1140 Type 2 diabetes mellitus with diabetic neuropathy, unspecified: Secondary | ICD-10-CM | POA: Diagnosis not present

## 2020-04-26 DIAGNOSIS — M159 Polyosteoarthritis, unspecified: Secondary | ICD-10-CM | POA: Diagnosis not present

## 2020-04-26 DIAGNOSIS — E782 Mixed hyperlipidemia: Secondary | ICD-10-CM | POA: Diagnosis not present

## 2020-04-26 DIAGNOSIS — I119 Hypertensive heart disease without heart failure: Secondary | ICD-10-CM | POA: Diagnosis not present

## 2020-04-26 DIAGNOSIS — E1149 Type 2 diabetes mellitus with other diabetic neurological complication: Secondary | ICD-10-CM

## 2020-04-26 DIAGNOSIS — R079 Chest pain, unspecified: Secondary | ICD-10-CM

## 2020-04-26 DIAGNOSIS — S7002XA Contusion of left hip, initial encounter: Secondary | ICD-10-CM | POA: Diagnosis not present

## 2020-04-26 DIAGNOSIS — I25110 Atherosclerotic heart disease of native coronary artery with unstable angina pectoris: Secondary | ICD-10-CM

## 2020-04-26 DIAGNOSIS — I5032 Chronic diastolic (congestive) heart failure: Secondary | ICD-10-CM

## 2020-04-26 LAB — LDL CHOLESTEROL, DIRECT: Direct LDL: 121 mg/dL

## 2020-04-26 LAB — LIPID PANEL
Cholesterol: 242 mg/dL — ABNORMAL HIGH (ref 0–200)
HDL: 47.4 mg/dL (ref >39.00)
NonHDL: 194.56
Total CHOL/HDL Ratio: 5
Triglycerides: 281 mg/dL — ABNORMAL HIGH (ref 0.0–149.0)
VLDL: 56.2 mg/dL — ABNORMAL HIGH (ref 0.0–40.0)

## 2020-04-26 LAB — COMPREHENSIVE METABOLIC PANEL
ALT: 15 U/L (ref 0–53)
AST: 19 U/L (ref 0–37)
Albumin: 4.7 g/dL (ref 3.5–5.2)
Alkaline Phosphatase: 80 U/L (ref 39–117)
BUN: 21 mg/dL (ref 6–23)
CO2: 31 mEq/L (ref 19–32)
Calcium: 9.5 mg/dL (ref 8.4–10.5)
Chloride: 103 mEq/L (ref 96–112)
Creatinine, Ser: 0.79 mg/dL (ref 0.40–1.50)
GFR: 80.62 mL/min (ref >60.00)
Glucose, Bld: 187 mg/dL — ABNORMAL HIGH (ref 70–99)
Potassium: 4.3 mEq/L (ref 3.5–5.1)
Sodium: 141 mEq/L (ref 135–145)
Total Bilirubin: 0.5 mg/dL (ref 0.2–1.2)
Total Protein: 6.8 g/dL (ref 6.0–8.3)

## 2020-04-26 LAB — POCT GLYCOSYLATED HEMOGLOBIN (HGB A1C): HbA1c, POC (controlled diabetic range): 6.7 % (ref 0.0–7.0)

## 2020-04-26 LAB — MICROALBUMIN / CREATININE URINE RATIO
Creatinine,U: 92.6 mg/dL
Microalb Creat Ratio: 5.8 mg/g (ref 0.0–30.0)
Microalb, Ur: 5.3 mg/dL — ABNORMAL HIGH (ref 0.0–1.9)

## 2020-04-26 MED ORDER — ROSUVASTATIN CALCIUM 10 MG PO TABS: 10.0000 mg | ORAL_TABLET | Freq: Every day | ORAL | 1 refills | Status: DC

## 2020-04-26 MED ORDER — NITROGLYCERIN 0.4 MG SL SUBL: 0.4000 mg | SUBLINGUAL_TABLET | SUBLINGUAL | 3 refills | Status: DC | PRN

## 2020-04-26 NOTE — Patient Instructions (Addendum)
A few things to remember from today's visit:   Chest pain, unspecified type - Plan: EKG 12-Lead  Type 2 diabetes mellitus with diabetic neuropathy, without long-term current use of insulin ([place]) - Plan: POC HgB A1c, Comprehensive metabolic panel, Microalbumin / creatinine urine ratio, Fructosamine  Hypertension with heart disease  Generalized osteoarthritis of multiple sites  Diabetes mellitus type 2 with neurological manifestations (HCC)  Chronic heart failure with preserved ejection fraction (HCC)  Atherosclerosis of native coronary artery of native heart with unstable angina pectoris ([place]) - Plan: Lipid panel  If you need refills please call your pharmacy. Do not use My Chart to request refills or for acute issues that need immediate attention.   Today Crestor 10 mg daily and Aspirin 81 mg daily. Fall precaution. PT at home. Nitroglycerine if chest pain. No changes in rest of meds today.   Please be sure medication list is accurate. If a new problem present, please set up appointment sooner than planned today.
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
## 2020-04-30 LAB — FRUCTOSAMINE: Fructosamine: 269 umol/L (ref 205–285)

## 2020-06-20 ENCOUNTER — Other Ambulatory Visit: Payer: Self-pay | Admitting: Neurology

## 2020-06-20 NOTE — Telephone Encounter (Signed)
Rx(s) sent to pharmacy electronically.  

## 2020-06-22 ENCOUNTER — Encounter: Payer: Self-pay | Admitting: Family Medicine

## 2020-06-24 MED ORDER — ONETOUCH ULTRA VI STRP: ORAL_STRIP | 4 refills | Status: DC

## 2020-07-02 DIAGNOSIS — H40003 Preglaucoma, unspecified, bilateral: Secondary | ICD-10-CM | POA: Diagnosis not present

## 2020-07-12 NOTE — Progress Notes (Signed)
Virtual Visit Via Video   The purpose of this virtual visit is to provide medical care while limiting exposure to the novel coronavirus.    Consent was obtained for video visit:  Yes.   Answered questions that patient had about telehealth interaction:  Yes.   I discussed the limitations, risks, security and privacy concerns of performing an evaluation and management service by telemedicine. I also discussed with the patient that there may be a patient responsible charge related to this service. The patient expressed understanding and agreed to proceed.  Pt location: Home Physician Location: office Name of referring provider:  [name], [name], MD I connected with [name] at patients initiation/request on 07/15/2020 at 10:15 AM EDT by video enabled telemedicine application and verified that I am speaking with the correct person using two identifiers. Pt MRN:  [id number] Pt DOB:  [birth year] Video Participants:  [name];    Assessment/Plan:   1.  Lower extremity cramping, likely made worse by alcohol  -not convinced what he thinks are potentially SE actually are all attributable to his medicine, but we will decrease Trileptal, half tablet once per day for 2 weeks and then patient can stop the medication.  If his symptoms do not go away, then we will obviously note was not from the medication.  If cramping comes back in that case, we will just put him back on the Trileptal.  If his symptoms do go away and cramping comes back, the patient would like to retry Dilantin.  He tried it with some success in the past.  He is to email me in the next 4 to 6 weeks to let me know how he is doing.  -Needs to discontinue alcohol.  2.  Tremor  -No evidence of Parkinson's disease.  No medication required.  3.  Diabetic peripheral neuropathy  -Alcohol may have contributed as well.  Subjective   Patient seen today in follow-up at his request.  He would like to get off of the Trileptal.  He read  about some side effects and thought potentially he was experiencing them.  He also would like to minimize his medication list given his age.  He does worry he will get the cramps back and asks about other meds if that happens.  States that he is having double vision at a distance.  He is fatigued.  He has occ nausea.  He has some hand tremor (chronic per our records).  He states that sometimes if he looks at a pic, he cannot figure out what it is.  Also, notes bad memory with word finding.    Current movement d/o meds:  Trileptal, 600 mg, half tablet twice per day   PREVIOUS MEDICATIONS: Tizanidine and baclofen without relief; Dilantin with some relief Current Outpatient Medications on File Prior to Visit  Medication Sig Dispense Refill  . albuterol (VENTOLIN HFA) 108 (90 Base) MCG/ACT inhaler Inhale 2 puffs into the lungs every 6 (six) hours as needed. 18 g 3  . Blood Glucose Monitoring Suppl (ACCU-CHEK AVIVA CONNECT) w/Device KIT 1 Device by Does not apply route daily. 1 kit 0  . Cyanocobalamin (B-12) 1000 MCG TABS Take 1,000 mcg by mouth daily.    Marland Kitchen docusate sodium (COLACE) 100 MG capsule Take 100 mg by mouth 2 (two) times daily.    . finasteride (PROSCAR) 5 MG tablet TAKE 1 TABLET BY MOUTH EVERY DAY 90 tablet 2  . furosemide (LASIX) 40 MG tablet TAKE 0.5 TABLET BY  MOUTH 2 TIMES DAILY. YOU MAY TAKE 1 EXTRA IN THE EVENING IF YOU HAVE SOB, LEE, AND WEIGHT GAIN >3LBS IN 24 HOURS 270 tablet 1  . glipiZIDE (GLUCOTROL) 5 MG tablet TAKE 1 TABLET BY MOUTH TWICE A DAY BEFORE A MEAL 180 tablet 1  . Lancet Devices (LANCET DEVICE WITH EJECTOR) MISC 1 Device by Does not apply route daily. 1 each 1  . metoprolol tartrate (LOPRESSOR) 25 MG tablet Take 1 tablet (25 mg total) by mouth daily. 90 tablet 2  . nitroGLYCERIN (NITROSTAT) 0.4 MG SL tablet Place 1 tablet (0.4 mg total) under the tongue every 5 (five) minutes as needed for chest pain. 50 tablet 3  . OneTouch Delica Lancets 30Q MISC USE TO CHECK BLOOD  SUGAR DAILY AND PRN 100 each 4  . ONETOUCH ULTRA test strip USE TO CHECK BLOOD SUGAR DAILY E11.9 100 strip 4  . oxcarbazepine (TRILEPTAL) 600 MG tablet TAKE HALF TABLET BY MOUTH TWICE DAILY 90 tablet 1  . rOPINIRole (REQUIP) 0.5 MG tablet TAKE 1 TABLET BY MOUTH AT BEDTIME. 90 tablet 1  . rosuvastatin (CRESTOR) 10 MG tablet Take 1 tablet (10 mg total) by mouth daily. 90 tablet 1  . Spacer/Aero-Holding Chambers (AEROCHAMBER PLUS) inhaler Use as instructed to use with inahaler. 1 each 1  . tamsulosin (FLOMAX) 0.4 MG CAPS capsule TAKE 1 CAPSULE BY MOUTH EVERY DAY AFTER SUPPER 90 capsule 2  . tizanidine (ZANAFLEX) 2 MG capsule TAKE 3 CAPSULES BY MOUTH EVERY DAY AT BEDTIME 270 capsule 1  . Triamcinolone Acetonide (TRIAMCINOLONE 0.1 % CREAM : EUCERIN) CREA Apply 1 application topically 2 (two) times daily as needed for rash or itching. 500 each 1   Current Facility-Administered Medications on File Prior to Visit  Medication Dose Route Frequency Provider Last Rate Last Admin  . triamcinolone acetonide (KENALOG-40) injection 40 mg  40 mg Intramuscular Once [name], [name], MD         Objective   There were no vitals filed for this visit. GEN:  The patient appears stated age and is in NAD.  Neurological examination:  Orientation: The patient is alert and oriented x3. Cranial nerves: There is good facial symmetry.  Speech is fluent and clear.    Follow up Instructions      -I discussed the assessment and treatment plan with the patient. The patient was provided an opportunity to ask questions and all were answered. The patient agreed with the plan and demonstrated an understanding of the instructions.   The patient was advised to call back or seek an in-person evaluation if the symptoms worsen or if the condition fails to improve as anticipated.    Total time spent on today's visit was 20 minutes, including both face-to-face time and nonface-to-face time.  Time included that spent on review  of records (prior notes available to me/labs/imaging if pertinent), discussing treatment and goals, answering patient's questions and coordinating care.   Alonza Bogus, DO

## 2020-07-15 ENCOUNTER — Other Ambulatory Visit: Payer: Self-pay

## 2020-07-15 ENCOUNTER — Telehealth (INDEPENDENT_AMBULATORY_CARE_PROVIDER_SITE_OTHER): Payer: Medicare HMO | Admitting: Neurology

## 2020-07-15 ENCOUNTER — Encounter: Payer: Self-pay | Admitting: Neurology

## 2020-07-15 VITALS — Ht 63.0 in | Wt 159.0 lb

## 2020-07-15 DIAGNOSIS — R252 Cramp and spasm: Secondary | ICD-10-CM

## 2020-07-17 ENCOUNTER — Other Ambulatory Visit: Payer: Self-pay | Admitting: Family Medicine

## 2020-08-05 ENCOUNTER — Other Ambulatory Visit: Payer: Self-pay

## 2020-08-05 DIAGNOSIS — R079 Chest pain, unspecified: Secondary | ICD-10-CM

## 2020-08-05 MED ORDER — NITROGLYCERIN 0.4 MG SL SUBL: 0.4000 mg | SUBLINGUAL_TABLET | SUBLINGUAL | 3 refills | Status: DC | PRN

## 2020-08-05 NOTE — Addendum Note (Signed)
Addended by: Rodrigo Ran on: 08/05/2020 04:11 PM   Modules accepted: Orders

## 2020-08-13 ENCOUNTER — Other Ambulatory Visit: Payer: Self-pay

## 2020-08-13 ENCOUNTER — Ambulatory Visit (INDEPENDENT_AMBULATORY_CARE_PROVIDER_SITE_OTHER): Payer: Medicare HMO | Admitting: Family Medicine

## 2020-08-13 ENCOUNTER — Encounter: Payer: Self-pay | Admitting: Family Medicine

## 2020-08-13 VITALS — BP 130/78 | HR 54 | Temp 97.5°F | Resp 16 | Ht 63.0 in | Wt 160.6 lb

## 2020-08-13 DIAGNOSIS — R079 Chest pain, unspecified: Secondary | ICD-10-CM | POA: Diagnosis not present

## 2020-08-13 DIAGNOSIS — I25110 Atherosclerotic heart disease of native coronary artery with unstable angina pectoris: Secondary | ICD-10-CM

## 2020-08-13 DIAGNOSIS — Z7189 Other specified counseling: Secondary | ICD-10-CM | POA: Diagnosis not present

## 2020-08-13 DIAGNOSIS — I119 Hypertensive heart disease without heart failure: Secondary | ICD-10-CM

## 2020-08-13 DIAGNOSIS — I5032 Chronic diastolic (congestive) heart failure: Secondary | ICD-10-CM | POA: Diagnosis not present

## 2020-08-13 DIAGNOSIS — R600 Localized edema: Secondary | ICD-10-CM

## 2020-08-13 DIAGNOSIS — E1149 Type 2 diabetes mellitus with other diabetic neurological complication: Secondary | ICD-10-CM | POA: Diagnosis not present

## 2020-08-13 DIAGNOSIS — E782 Mixed hyperlipidemia: Secondary | ICD-10-CM | POA: Diagnosis not present

## 2020-08-13 LAB — TSH: TSH: 2.36 u[IU]/mL (ref 0.35–4.50)

## 2020-08-13 LAB — HEMOGLOBIN A1C: Hgb A1c MFr Bld: 7.5 % — ABNORMAL HIGH (ref 4.6–6.5)

## 2020-08-13 LAB — BASIC METABOLIC PANEL
BUN: 20 mg/dL (ref 6–23)
CO2: 28 mEq/L (ref 19–32)
Calcium: 9.5 mg/dL (ref 8.4–10.5)
Chloride: 104 mEq/L (ref 96–112)
Creatinine, Ser: 0.67 mg/dL (ref 0.40–1.50)
GFR: 84.56 mL/min (ref >60.00)
Glucose, Bld: 187 mg/dL — ABNORMAL HIGH (ref 70–99)
Potassium: 4.2 mEq/L (ref 3.5–5.1)
Sodium: 140 mEq/L (ref 135–145)

## 2020-08-13 LAB — CBC
HCT: 42 % (ref 39.0–52.0)
Hemoglobin: 14.5 g/dL (ref 13.0–17.0)
MCHC: 34.5 g/dL (ref 30.0–36.0)
MCV: 88.7 fl (ref 78.0–100.0)
Platelets: 158 10*3/uL (ref 150.0–400.0)
RBC: 4.74 Mil/uL (ref 4.22–5.81)
RDW: 13.3 % (ref 11.5–15.5)
WBC: 5.4 10*3/uL (ref 4.0–10.5)

## 2020-08-13 MED ORDER — FUROSEMIDE 40 MG PO TABS: 40.0000 mg | ORAL_TABLET | Freq: Two times a day (BID) | ORAL | 1 refills | Status: DC

## 2020-08-13 MED ORDER — METOPROLOL TARTRATE 25 MG PO TABS: 12.5000 mg | ORAL_TABLET | Freq: Every day | ORAL | 0 refills | Status: DC

## 2020-08-13 MED ORDER — ISOSORBIDE MONONITRATE ER 30 MG PO TB24: 30.0000 mg | ORAL_TABLET | Freq: Every day | ORAL | 0 refills | Status: DC

## 2020-08-13 MED ORDER — NITROGLYCERIN 0.4 MG SL SUBL: 0.4000 mg | SUBLINGUAL_TABLET | SUBLINGUAL | 1 refills | Status: DC | PRN

## 2020-08-13 MED ORDER — ROSUVASTATIN CALCIUM 10 MG PO TABS: 20.0000 mg | ORAL_TABLET | Freq: Every day | ORAL | 1 refills | Status: DC

## 2020-08-13 NOTE — Patient Instructions (Signed)
A few things to remember from today's visit:   Chest pain, unspecified type - Plan: EKG 12-Lead  Hyperlipidemia, mixed - Plan: rosuvastatin (CRESTOR) 10 MG tablet  Atherosclerosis of native coronary artery of native heart with unstable angina pectoris ([place]) - Plan: rosuvastatin (CRESTOR) 10 MG tablet  Bilateral lower extremity edema - Plan: furosemide (LASIX) 40 MG tablet  Hypertension with heart disease - Plan: Basic metabolic panel, TSH, CBC  Diabetes mellitus type 2 with neurological manifestations ([place]) - Plan: Hemoglobin A1c, Fructosamine  Chronic heart failure with preserved ejection fraction ([place])  If you need refills please call your pharmacy. Do not use My Chart to request refills or for acute issues that need immediate attention.   Today Crestor increased from 10 mg to 20 mg. Metoprolol tartrate decreased from 25 mg to 12.5 mg. Imdur 30 mg added. Monitor blood pressure. Continue Aspirin.  Angina  Angina is discomfort or pain in the chest, neck, arm, jaw, or back. The discomfort is caused by a lack of blood in the middle layer of the heart wall. The middle layer of the heart wall is called the myocardium. What are the causes? This condition is caused by a buildup of fat and cholesterol, or plaque, in your arteries. This buildup narrows the arteries and makes it hard for blood to flow. What increases the risk? Main risks  High levels of cholesterol in your blood.  High blood pressure.  Diabetes.  Family history of heart disease.  Not exercising or moving enough.  Depression.  Having had radiation treatment on the left side of your chest. Other risks  Tobacco use.  Too much body weight (obesity).  A diet that is high in unhealthy fats (saturated fats).  Stress.  Using drugs, such as cocaine. Risks for women  Being older than 55 years.  Being in menopause. This is the time when a woman no longer has a menstrual period. What are the signs or  symptoms? Symptoms in all people  Chest pain, which may: ? Feel like a crushing or squeezing in the chest. ? Feel like a tightness, pressure, fullness, or heaviness in the chest. ? Last for more than a few minutes at a time. ? Stop and come back (recur) after a few minutes.  Pain in the neck, arm, jaw, or back.  Heartburn or upset stomach (indigestion) for no reason.  Being short of breath.  Feeling like you may vomit (nauseous).  Sudden cold sweats. Symptoms in women and people with diabetes  Tiredness.  Worry and anxiety.  Weakness.  Dizziness or fainting. How is this treated? This condition may be treated with:  Medicines. These can be given to prevent blood clots, stop a heart attack, lower blood pressure, or treat other risk factors.  A procedure to widen a narrowed or blocked artery in the heart.  Surgery to allow blood to go around a blocked artery. Follow these instructions at home: Medicines  Take over-the-counter and prescription medicines only as told by your doctor.  Do not take these medicines unless your doctor says that you can: ? NSAIDs. These include:  Ibuprofen.  Naproxen. ? Vitamin supplements that have vitamin A, vitamin E, or both. ? Hormone therapy that contains estrogen with or without progestin. Eating and drinking  Eat a heart-healthy diet that includes: ? Lots of fresh fruits and vegetables. ? Whole grains. ? Low-fat (lean) protein. ? Low-fat dairy products.  Follow instructions from your doctor about what you cannot eat or drink.  Activity  Follow an exercise program that your doctor tells you.  Talk with your doctor about joining a program to make your heart strong again (cardiac rehab).  When you feel tired, take a break. Plan breaks if you know you are going to feel tired. Lifestyle  Do not smoke or use any products that contain nicotine or tobacco. If you need help quitting, ask your doctor.  If your doctor says you  can drink alcohol: ? Limit how much you have to:  0-1 drink a day for women who are not pregnant.  0-2 drinks a day for men. ? Know how much alcohol is in your drink. In the [place] one drink equals one 12 oz bottle of beer (355 mL), one 5 oz glass of wine (148 mL), or one 1 oz glass of hard liquor (44 mL).   General instructions  Stay at a healthy weight. If told to lose weight, work with your doctor to do lose weight safely.  Learn to manage stress. If you need help, ask your doctor.  Keep your vaccines up to date. Get a flu shot every year.  Talk with your doctor if you feel sad. Take a screening test to see if you are at risk for depression.  Work with your doctor to manage any other health problems that you have. These may include diabetes or high blood pressure.  Keep all follow-up visits. Get help right away if:  You have pain in your chest, neck, arm, jaw, or back, and the pain: ? Lasts more than a few minutes. ? Comes back. ? Does not get better after you take medicine under your tongue (sublingual nitroglycerin). ? Keeps getting worse. ? Comes more often.  You have any of these problems: ? Sweating a lot. ? Heartburn or upset stomach. ? Shortness of breath. ? Trouble breathing. ? Feeling like you may vomit. ? Vomiting. ? Feeling more tired than normal. ? Feeling nervous or worrying more than normal. ? Weakness.  You are dizzy or light-headed all of a sudden.  You faint. These symptoms may be an emergency. Get help right away. Call your local emergency services (911 in the [place]  Do not wait to see if the symptoms will go away.  Do not drive yourself to the hospital. Summary  Angina is discomfort or pain in the chest, neck, arm, neck, or back.  Symptoms include chest pain, heartburn or upset stomach, and shortness of breath.  Women or people with diabetes may have other symptoms, such as feeling nervous, being worried, or being weak or tired.  Take all  medicines only as told by your doctor.  You should eat a heart-healthy diet and follow an exercise program. This information is not intended to replace advice given to you by your health care provider. Make sure you discuss any questions you have with your health care provider. Document Revised: 09/22/2019 Document Reviewed: 09/22/2019 Elsevier Patient Education  2021 [place].  Please be sure medication list is accurate. If a new problem present, please set up appointment sooner than planned today.

## 2020-08-13 NOTE — Progress Notes (Addendum)
Chief Complaint  Patient presents with  . Chest Pain    Medication    HPI: [name] is a pleasant 87 y.o. male with history of CAD, CHF, hypertension, DM 2, hyperlipidemia, and chronic pain here with his wife today complaining of episodes of mid chest pain, achy like,radiated to jaw and UE's.  He has not identified exacerbating or alleviating factors. It has happened at rest and with mild exertion. SL nitroglycerine helped.  He started taking Furosemide 40 mg 2-3 times per day and noted that pain resolved. He was not taking diuretic because aggravated urinary frequency (hx of BPH).  He has not had any CP in a week. He denies associated dyspnea, palpitation, diaphoresis, or dizziness.  Evaluated by cardiology, last visit in 08/2015. CAD status post PCI to RCA in 2004, non-STEMI in 2010  s/p bare-metal stent to RCA. He is on Aspirin 81 mg daily.  Echo on 08/31/2017: LVEF 60 to 83%, grade 2 diastolic dysfunction.  No regional wall motion abnormalities, mild AR, mild MR, mildly increased pulmonary artery pressure, 38 mmHg. Negative for orthopnea and PND. LE edema has improved since he has been taking furosemide daily.It is better in the morning and worse at the end of the day.  Hypertension on metoprolol tartrate 25 mg daily. Negative for unusual/frequent headache, visual changes, or focal neurologic deficit. Lab Results  Component Value Date   CREATININE 0.79 04/26/2020   BUN 21 04/26/2020   NA 141 04/26/2020   K 4.3 04/26/2020   CL 103 04/26/2020   CO2 31 04/26/2020   Currently he is on Crestor 10 mg daily, which he has tolerated well. Lipitor caused myalgias.  Lab Results  Component Value Date   CHOL 242 (H) 04/26/2020   HDL 47.40 04/26/2020   LDLCALC 124 (H) 01/30/2019   LDLDIRECT 121.0 04/26/2020   TRIG 281.0 (H) 04/26/2020   CHOLHDL 5 04/26/2020   DM2: Currently he is on glipizide 5 mg twice daily. Negative for polyphagia, polydipsia, and polyuria. He  has decreased portions and has lost some wt. Physical activity is limited due to generalized OA.  Lab Results  Component Value Date   HGBA1C 6.7 04/26/2020   Lower extremity cramps: Following with neurologist. He does continue oxcarbazepine, it was causing drowsiness and worsening unstable gait. He feels like cramps have improved. Currently he is on Requip 0.5 mg daily at bedtime.  Review of Systems  Constitutional: Positive for fatigue. Negative for activity change, appetite change and fever.  HENT: Negative for mouth sores, nosebleeds and sore throat.   Respiratory: Negative for cough and wheezing.   Gastrointestinal: Negative for abdominal pain, nausea and vomiting.  Genitourinary: Negative for decreased urine volume, dysuria and hematuria.  Musculoskeletal: Positive for arthralgias and gait problem.  Skin: Negative for pallor and rash.  Neurological: Negative for syncope, facial asymmetry and weakness.  Psychiatric/Behavioral: Positive for sleep disturbance. Negative for confusion.  Rest see pertinent positives and negatives per HPI.  Current Outpatient Medications on File Prior to Visit  Medication Sig Dispense Refill  . albuterol (VENTOLIN HFA) 108 (90 Base) MCG/ACT inhaler Inhale 2 puffs into the lungs every 6 (six) hours as needed. 18 g 3  . Blood Glucose Monitoring Suppl (ACCU-CHEK AVIVA CONNECT) w/Device KIT 1 Device by Does not apply route daily. 1 kit 0  . Cyanocobalamin (B-12) 1000 MCG TABS Take 1,000 mcg by mouth daily. (Patient not taking: Reported on 07/15/2020)    . finasteride (PROSCAR) 5 MG tablet TAKE 1  TABLET BY MOUTH EVERY DAY 90 tablet 2  . glipiZIDE (GLUCOTROL) 5 MG tablet TAKE 1 TABLET BY MOUTH TWICE A DAY BEFORE A MEAL 180 tablet 1  . Lancet Devices (LANCET DEVICE WITH EJECTOR) MISC 1 Device by Does not apply route daily. 1 each 1  . OneTouch Delica Lancets 29N MISC USE TO CHECK BLOOD SUGAR DAILY AND PRN 100 each 4  . ONETOUCH ULTRA test strip USE TO CHECK  BLOOD SUGAR DAILY E11.9 100 strip 4  . oxcarbazepine (TRILEPTAL) 600 MG tablet TAKE HALF TABLET BY MOUTH TWICE DAILY (Patient not taking: Reported on 07/15/2020) 90 tablet 1  . rOPINIRole (REQUIP) 0.5 MG tablet TAKE 1 TABLET BY MOUTH AT BEDTIME. 90 tablet 1  . tamsulosin (FLOMAX) 0.4 MG CAPS capsule TAKE 1 CAPSULE BY MOUTH EVERY DAY AFTER SUPPER 90 capsule 2  . tizanidine (ZANAFLEX) 2 MG capsule TAKE 3 CAPSULES BY MOUTH EVERY DAY AT BEDTIME 270 capsule 1   Current Facility-Administered Medications on File Prior to Visit  Medication Dose Route Frequency Provider Last Rate Last Admin  . triamcinolone acetonide (KENALOG-40) injection 40 mg  40 mg Intramuscular Once [name], Pearce Littlefield G, MD         Past Medical History:  Diagnosis Date  . BACK PAIN, CHRONIC 04/18/2009  . BENIGN PROSTATIC HYPERTROPHY, HX OF 01/19/2007  . Carpal tunnel syndrome 03/24/2007  . CORONARY ARTERY DISEASE 01/19/2007  . DEPRESSION, CHRONIC 06/24/2007  . DIABETES MELLITUS 01/19/2007  . DIABETES MELLITUS, TYPE II, WITH NEUROLOGICAL COMPLICATIONS 12/19/9209  . Diarrhea 02/23/2007  . HEART MURMUR, SYSTOLIC 9/41/7408  . HERPES SIMPLEX INFECTION 01/19/2007  . HYPERLIPIDEMIA 01/19/2007  . HYPERTENSION 01/19/2007  . Intermediate coronary syndrome ([place]) 08/29/2008  . LEG CRAMPS, NOCTURNAL 10/28/2007  . Leg cramps, sleep related 08/05/2010  . OBSTRUCTIVE SLEEP APNEA 01/19/2007   does not use CPAP  . OSTEOARTHRITIS 06/24/2007  . Other postprocedural status(V45.89) 01/19/2007  . PERCUTANEOUS TRANSLUMINAL CORONARY ANGIOPLASTY, HX OF 01/19/2007  . PLANTAR FASCIITIS, RIGHT 01/19/2007  . SHOULDER PAIN, LEFT 03/24/2007  . SPINAL STENOSIS, LUMBAR 06/03/2009  . TINEA PEDIS 11/15/2009  . URI 01/11/2009   No Known Allergies  Social History   Socioeconomic History  . Marital status: Married    Spouse name: Not on file  . Number of children: Not on file  . Years of education: Not on file  . Highest education level: Not on file  Occupational History   . Occupation: retired    Comment: Truck Research officer, political party  Tobacco Use  . Smoking status: Former Smoker    Quit date: 04/14/1983    Years since quitting: 37.3  . Smokeless tobacco: Never Used  . Tobacco comment: stopped 1985  Vaping Use  . Vaping Use: Never used  Substance and Sexual Activity  . Alcohol use: Yes    Comment: OCCASSIONAL  . Drug use: Not Currently    Types: Marijuana    Comment: occ  . Sexual activity: Not on file  Other Topics Concern  . Not on file  Social History Narrative   Married in 1959 - 6yr, divorced; married 1974 - 1.5 years, divorced; married 1985 1 daughter 136 120step daughters; 2 grandchildren.   Social Determinants of Health   Financial Resource Strain: Low Risk   . Difficulty of Paying Living Expenses: Not hard at all  Food Insecurity: No Food Insecurity  . Worried About RCharity fundraiserin the Last Year: Never true  . Ran Out of Food in the Last Year: Never  true  Transportation Needs: No Transportation Needs  . Lack of Transportation (Medical): No  . Lack of Transportation (Non-Medical): No  Physical Activity: Inactive  . Days of Exercise per Week: 0 days  . Minutes of Exercise per Session: 0 min  Stress: No Stress Concern Present  . Feeling of Stress : Only a little  Social Connections: Moderately Isolated  . Frequency of Communication with Friends and Family: More than three times a week  . Frequency of Social Gatherings with Friends and Family: Twice a week  . Attends Religious Services: Never  . Active Member of Clubs or Organizations: No  . Attends Archivist Meetings: Never  . Marital Status: Married    Vitals:   08/13/20 1132  BP: 130/78  Pulse: (!) 54  Resp: 16  Temp: (!) 97.5 F (36.4 C)  SpO2: 96%   Body mass index is 28.45 kg/m.  Physical Exam Vitals and nursing note reviewed.  Constitutional:      General: He is not in acute distress.    Appearance: He is well-developed.  HENT:     Head:  Normocephalic and atraumatic.  Eyes:     Conjunctiva/sclera: Conjunctivae normal.  Neck:     Vascular: No JVD.  Cardiovascular:     Rate and Rhythm: Regular rhythm. Bradycardia present. Occasional extrasystoles (X 1) are present.    Pulses:          Dorsalis pedis pulses are 2+ on the right side and 2+ on the left side.     Heart sounds: Murmur (SEM II/VI and diastolic I/VI RUSB-LUSB) heard.    Pulmonary:     Effort: Pulmonary effort is normal. No respiratory distress.     Breath sounds: Normal breath sounds.  Abdominal:     Palpations: Abdomen is soft. There is no hepatomegaly or mass.     Tenderness: There is no abdominal tenderness.  Musculoskeletal:     Right lower leg: 1+ Pitting Edema present.     Left lower leg: 1+ Pitting Edema present.  Lymphadenopathy:     Cervical: No cervical adenopathy.  Skin:    General: Skin is warm.     Findings: No erythema or rash.  Neurological:     General: No focal deficit present.     Mental Status: He is alert and oriented to person, place, and time.     Cranial Nerves: No cranial nerve deficit.     Comments: Unstable/antalgic gait assisted with a cane.  Psychiatric:        Mood and Affect: Mood is not anxious or depressed.     Comments: Well groomed, good eye contact.    ASSESSMENT AND PLAN:  [name] was seen today for chest pain.  Diagnoses and all orders for this visit: Orders Placed This Encounter  Procedures  . Basic metabolic panel  . Hemoglobin A1c  . Fructosamine  . TSH  . CBC  . EKG 12-Lead   Lab Results  Component Value Date   WBC 5.4 08/13/2020   HGB 14.5 08/13/2020   HCT 42.0 08/13/2020   MCV 88.7 08/13/2020   PLT 158.0 08/13/2020   Lab Results  Component Value Date   TSH 2.36 08/13/2020   Lab Results  Component Value Date   HGBA1C 7.5 (H) 08/13/2020   Lab Results  Component Value Date   CREATININE 0.67 08/13/2020   BUN 20 08/13/2020   NA 140 08/13/2020   K 4.2 08/13/2020   CL 104 08/13/2020    CO2  28 08/13/2020    Atherosclerosis of native coronary artery of native heart with unstable angina pectoris [facility]) He has tolerated Crestor 10 mg well, so he agrees with increasing dose to 20 mg. Metoprolol dose decreased fro 25 mg to 12.5 mg. Imdur 30 mg added. He is not interested in invasive treatments.  -     rosuvastatin (CRESTOR) 10 MG tablet; Take 2 tablets (20 mg total) by mouth daily. -     metoprolol tartrate (LOPRESSOR) 25 MG tablet; Take 0.5 tablets (12.5 mg total) by mouth daily. -     isosorbide mononitrate (IMDUR) 30 MG 24 hr tablet; Take 1 tablet (30 mg total) by mouth daily. -     nitroGLYCERIN (NITROSTAT) 0.4 MG SL tablet; Place 1 tablet (0.4 mg total) under the tongue every 5 (five) minutes as needed for chest pain.  Chest pain, unspecified type Asymptomatic for the past few days. We discussed possible etiologies. History suggest angina. EKG today: Sinus bradycardia, no significant difference with EKG done on 04/26/20. He does not want to see cardiologist. Clearly instructed about warning signs.  Hyperlipidemia, mixed LDL goal < 70. he has tolerated Crestor 10 mg well, recommend trying to increase dose to 20 mg. Continue low-fat diet.  -     rosuvastatin (CRESTOR) 10 MG tablet; Take 2 tablets (20 mg total) by mouth daily.  Bilateral lower extremity edema Improved with taking furosemide daily. We discussed some side effects of diuretics. For now continue furosemide 40 mg twice daily. LE elevation a few times throughout the day. Compression stockings are ideal but he is not able to put them on.  -     furosemide (LASIX) 40 MG tablet; Take 1 tablet (40 mg total) by mouth 2 (two) times daily.  Hypertension with heart disease Today BP adequately controlled. Metoprolol tartrate decreased from 25 mg daily to 12.5 mg daily. Imdur 30 mg added today. We discussed some side effects of medications. Monitor BP at home.  -     metoprolol tartrate (LOPRESSOR) 25 MG  tablet; Take 0.5 tablets (12.5 mg total) by mouth daily.  Diabetes mellitus type 2 with neurological manifestations ([place]) Problem has been adequately controlled. Continue glipizide 5 mg twice daily. Appropriate foot care and annual eye examination. Further recommendation will be given according to A1c result.  Chronic heart failure with preserved ejection fraction (HCC) Continue furosemide 40 mg twice daily. Because of bradycardia, metoprolol titrate dose decreased from 25 mg to 12.5 mg, planning on weaning it off. Low-salt diet.  -     furosemide (LASIX) 40 MG tablet; Take 1 tablet (40 mg total) by mouth 2 (two) times daily.  Goals of care, counseling/discussion He does not want aggressive treatments. He is not suicidal but states that he is ready to died whenever it happens. When asked about code status, he does not give me a Y/N answer but tells me that he has lived a long and good life.Today we did not make a decision, strongly recommend discussion at home.   Spent 55 minutes with pt.  During this time history was obtained and documented, examination was performed, prior labs/imaging reviewed, and assessment/plan discussed.  Return in about 4 weeks (around 09/10/2020).   Kaily Wragg G. [name], MD  [facility]. [place] office.   A few things to remember from today's visit:   Chest pain, unspecified type - Plan: EKG 12-Lead  Hyperlipidemia, mixed - Plan: rosuvastatin (CRESTOR) 10 MG tablet  Atherosclerosis of native coronary artery of native heart with  unstable angina pectoris ([place]) - Plan: rosuvastatin (CRESTOR) 10 MG tablet  Bilateral lower extremity edema - Plan: furosemide (LASIX) 40 MG tablet  Hypertension with heart disease - Plan: Basic metabolic panel, TSH, CBC  Diabetes mellitus type 2 with neurological manifestations ([name]) - Plan: Hemoglobin A1c, Fructosamine  Chronic heart failure with preserved ejection fraction ([name])  If you need refills please call  your pharmacy. Do not use My Chart to request refills or for acute issues that need immediate attention.   Today Crestor increased from 10 mg to 20 mg. Metoprolol tartrate decreased from 25 mg to 12.5 mg. Imdur 30 mg added. Monitor blood pressure. Continue Aspirin.  Angina  Angina is discomfort or pain in the chest, neck, arm, jaw, or back. The discomfort is caused by a lack of blood in the middle layer of the heart wall. The middle layer of the heart wall is called the myocardium. What are the causes? This condition is caused by a buildup of fat and cholesterol, or plaque, in your arteries. This buildup narrows the arteries and makes it hard for blood to flow. What increases the risk? Main risks  High levels of cholesterol in your blood.  High blood pressure.  Diabetes.  Family history of heart disease.  Not exercising or moving enough.  Depression.  Having had radiation treatment on the left side of your chest. Other risks  Tobacco use.  Too much body weight (obesity).  A diet that is high in unhealthy fats (saturated fats).  Stress.  Using drugs, such as cocaine. Risks for women  Being older than 55 years.  Being in menopause. This is the time when a woman no longer has a menstrual period. What are the signs or symptoms? Symptoms in all people  Chest pain, which may: ? Feel like a crushing or squeezing in the chest. ? Feel like a tightness, pressure, fullness, or heaviness in the chest. ? Last for more than a few minutes at a time. ? Stop and come back (recur) after a few minutes.  Pain in the neck, arm, jaw, or back.  Heartburn or upset stomach (indigestion) for no reason.  Being short of breath.  Feeling like you may vomit (nauseous).  Sudden cold sweats. Symptoms in women and people with diabetes  Tiredness.  Worry and anxiety.  Weakness.  Dizziness or fainting. How is this treated? This condition may be treated with:  Medicines. These  can be given to prevent blood clots, stop a heart attack, lower blood pressure, or treat other risk factors.  A procedure to widen a narrowed or blocked artery in the heart.  Surgery to allow blood to go around a blocked artery. Follow these instructions at home: Medicines  Take over-the-counter and prescription medicines only as told by your doctor.  Do not take these medicines unless your doctor says that you can: ? NSAIDs. These include:  Ibuprofen.  Naproxen. ? Vitamin supplements that have vitamin A, vitamin E, or both. ? Hormone therapy that contains estrogen with or without progestin. Eating and drinking  Eat a heart-healthy diet that includes: ? Lots of fresh fruits and vegetables. ? Whole grains. ? Low-fat (lean) protein. ? Low-fat dairy products.  Follow instructions from your doctor about what you cannot eat or drink.   Activity  Follow an exercise program that your doctor tells you.  Talk with your doctor about joining a program to make your heart strong again (cardiac rehab).  When you feel tired, take a break. Plan  breaks if you know you are going to feel tired. Lifestyle  Do not smoke or use any products that contain nicotine or tobacco. If you need help quitting, ask your doctor.  If your doctor says you can drink alcohol: ? Limit how much you have to:  0-1 drink a day for women who are not pregnant.  0-2 drinks a day for men. ? Know how much alcohol is in your drink. In the [place] one drink equals one 12 oz bottle of beer (355 mL), one 5 oz glass of wine (148 mL), or one 1 oz glass of hard liquor (44 mL).   General instructions  Stay at a healthy weight. If told to lose weight, work with your doctor to do lose weight safely.  Learn to manage stress. If you need help, ask your doctor.  Keep your vaccines up to date. Get a flu shot every year.  Talk with your doctor if you feel sad. Take a screening test to see if you are at risk for  depression.  Work with your doctor to manage any other health problems that you have. These may include diabetes or high blood pressure.  Keep all follow-up visits. Get help right away if:  You have pain in your chest, neck, arm, jaw, or back, and the pain: ? Lasts more than a few minutes. ? Comes back. ? Does not get better after you take medicine under your tongue (sublingual nitroglycerin). ? Keeps getting worse. ? Comes more often.  You have any of these problems: ? Sweating a lot. ? Heartburn or upset stomach. ? Shortness of breath. ? Trouble breathing. ? Feeling like you may vomit. ? Vomiting. ? Feeling more tired than normal. ? Feeling nervous or worrying more than normal. ? Weakness.  You are dizzy or light-headed all of a sudden.  You faint. These symptoms may be an emergency. Get help right away. Call your local emergency services (911 in the [place]  Do not wait to see if the symptoms will go away.  Do not drive yourself to the hospital. Summary  Angina is discomfort or pain in the chest, neck, arm, neck, or back.  Symptoms include chest pain, heartburn or upset stomach, and shortness of breath.  Women or people with diabetes may have other symptoms, such as feeling nervous, being worried, or being weak or tired.  Take all medicines only as told by your doctor.  You should eat a heart-healthy diet and follow an exercise program. This information is not intended to replace advice given to you by your health care provider. Make sure you discuss any questions you have with your health care provider. Document Revised: 09/22/2019 Document Reviewed: 09/22/2019 Elsevier Patient Education  2021 [place].  Please be sure medication list is accurate. If a new problem present, please set up appointment sooner than planned today.

## 2020-08-18 LAB — FRUCTOSAMINE: Fructosamine: 302 umol/L — ABNORMAL HIGH (ref 205–285)

## 2020-09-10 ENCOUNTER — Ambulatory Visit: Payer: Medicare HMO | Admitting: Family Medicine

## 2020-09-17 ENCOUNTER — Ambulatory Visit (INDEPENDENT_AMBULATORY_CARE_PROVIDER_SITE_OTHER): Payer: Medicare HMO | Admitting: Family Medicine

## 2020-09-17 ENCOUNTER — Encounter: Payer: Self-pay | Admitting: Family Medicine

## 2020-09-17 ENCOUNTER — Other Ambulatory Visit: Payer: Self-pay

## 2020-09-17 VITALS — BP 126/60 | HR 70 | Resp 16 | Ht 63.0 in | Wt 159.0 lb

## 2020-09-17 DIAGNOSIS — J449 Chronic obstructive pulmonary disease, unspecified: Secondary | ICD-10-CM

## 2020-09-17 DIAGNOSIS — I25110 Atherosclerotic heart disease of native coronary artery with unstable angina pectoris: Secondary | ICD-10-CM

## 2020-09-17 DIAGNOSIS — R079 Chest pain, unspecified: Secondary | ICD-10-CM | POA: Diagnosis not present

## 2020-09-17 DIAGNOSIS — R252 Cramp and spasm: Secondary | ICD-10-CM

## 2020-09-17 DIAGNOSIS — R69 Illness, unspecified: Secondary | ICD-10-CM | POA: Diagnosis not present

## 2020-09-17 DIAGNOSIS — E1140 Type 2 diabetes mellitus with diabetic neuropathy, unspecified: Secondary | ICD-10-CM | POA: Diagnosis not present

## 2020-09-17 DIAGNOSIS — F330 Major depressive disorder, recurrent, mild: Secondary | ICD-10-CM

## 2020-09-17 MED ORDER — EMPAGLIFLOZIN 10 MG PO TABS: 10.0000 mg | ORAL_TABLET | Freq: Every day | ORAL | 0 refills | Status: DC

## 2020-09-17 NOTE — Progress Notes (Signed)
Chief Complaint  Patient presents with  . Follow-up   HPI: [name] is a 87 y.o. male with hypertension, COPD, hyperlipidemia, DM 2, CAD, OSA, and generalized OA here today to follow on recent OV.  He was seen on 08/13/2020, when he was complaining of episodes of CP, achy-like pain, radiated to jaw upper extremities. He declined cardiologist evaluation. Imdur 30 mg daily started last visit. He has tolerated medication well.  Because of bradycardia, metoprolol tartrate was decreased from 25 mg daily to 12.5 mg daily.  Since his last visit he has had more episodes of chest pain with similar characteristics. No associated diaphoresis,dyspnea,palpitation,or dizziness.  Not identified exacerbating factors.  Pain last a few minutes, usually resolves after taking Nitroglycerine sl, 1-2.  He has not sought medical attention. He does not want to be evaluated in the ER or hospitalized for this problem.  He has had some issue with wisdom tooth, wonders if this is causing the episodes of jaw pain.  Sometimes he feels like he has to breath deep a few times while seated, no clear SOB.  Lab Results  Component Value Date   CREATININE 0.67 08/13/2020   BUN 20 08/13/2020   NA 140 08/13/2020   K 4.2 08/13/2020   CL 104 08/13/2020   CO2 28 08/13/2020   Evaluated by cardiology in 08/2015. CAD status post PCI to RCA in 2004, non-STEMI in 2010  s/p bare-metal stent to RCA. He is on Aspirin 81 mg daily, he sometimes take 2 tabs when he is having CP.  Echo on 08/31/2017: LVEF 60 to 82%, grade 2 diastolic dysfunction.  No regional wall motion abnormalities, mild AR, mild MR, mildly increased pulmonary artery pressure, 38 mmHg.  He is taking Furosemide 20 mg bid, sometimes he takes a 3rd dose. Negative for orthopnea and PND. LE edema has been stable.  HLD: He decreased Crestor dose down to 20 mg because felt like LE cramps were worse with 40 mg daily.  Lab Results  Component Value  Date   CHOL 242 (H) 04/26/2020   HDL 47.40 04/26/2020   LDLCALC 124 (H) 01/30/2019   LDLDIRECT 121.0 04/26/2020   TRIG 281.0 (H) 04/26/2020   CHOLHDL 5 04/26/2020   COPD: Usually symptoms are exacerbated by respiratory infections. Negative for wheezing and cough. He uses Albuterol inh prn. Former smoker.  DM II: He is forgetting to take his glipizide. He is eating smaller portions. BS's 120-150's. Negative for polydipsia,polyuria, or polyphagia. Peripheral neuropathy: Feels like he has soaks on all the time.   Lab Results  Component Value Date   HGBA1C 7.5 (H) 08/13/2020   Leg cramps: Problem has been going on for many years, > 5. He follows with [name], he d/c'ed Trileptal. He also takes Zanaflex 2 mg, increased dose from 3 tabs to 4-5 tabs daily, after stopping Trileptal because cramps got slightly worse. Zanaflex helps. He takes Requp 0.5 mg daily, he is not sure if helping with leg cramps.  Frustrated because ADL limitations due to chronic pain. Has had depression in the past. He would like to be able to do yard work and help more with house chores. He tried [employer] for short period of time, did not helped. Overall he is feeling "fine."  Depression screen [facility] 2/9 09/17/2020 12/06/2019 11/08/2018 10/09/2016 01/01/2016  Decreased Interest 2 0 _0 Down, Depressed, Hopeless _1 PHQ - 2 Score _2 Altered  sleeping 2 - _0 Tired, decreased energy 2 - _1 Change in appetite 2 - 2 1 0  Feeling bad or failure about yourself  0 - 3 3 0  Trouble concentrating 1 - 2 0 2  Moving slowly or fidgety/restless 2 - 0 1 0  Suicidal thoughts 1 - 0 1 0  PHQ-9 Score 13 - _2 Difficult doing work/chores Somewhat difficult - Somewhat difficult Very difficult Somewhat difficult  Some recent data might be hidden   Review of Systems  Constitutional: Positive for fatigue. Negative for activity change and fever.  HENT: Negative for mouth sores, nosebleeds and sore  throat.   Eyes: Negative for redness and visual disturbance.  Gastrointestinal: Negative for abdominal pain, nausea and vomiting.  Genitourinary: Negative for decreased urine volume, dysuria and hematuria.  Musculoskeletal: Positive for arthralgias and gait problem.  Neurological: Negative for weakness and headaches.  Psychiatric/Behavioral: Positive for sleep disturbance. Negative for confusion.  Rest see pertinent positives and negatives per HPI.  Current Outpatient Medications on File Prior to Visit  Medication Sig Dispense Refill  . albuterol (VENTOLIN HFA) 108 (90 Base) MCG/ACT inhaler Inhale 2 puffs into the lungs every 6 (six) hours as needed. 18 g 3  . Blood Glucose Monitoring Suppl (ACCU-CHEK AVIVA CONNECT) w/Device KIT 1 Device by Does not apply route daily. 1 kit 0  . Cyanocobalamin (B-12) 1000 MCG TABS Take 1,000 mcg by mouth daily.    . finasteride (PROSCAR) 5 MG tablet TAKE 1 TABLET BY MOUTH EVERY DAY 90 tablet 2  . furosemide (LASIX) 40 MG tablet Take 1 tablet (40 mg total) by mouth 2 (two) times daily. 180 tablet 1  . isosorbide mononitrate (IMDUR) 30 MG 24 hr tablet Take 1 tablet (30 mg total) by mouth daily. 90 tablet 0  . Lancet Devices (LANCET DEVICE WITH EJECTOR) MISC 1 Device by Does not apply route daily. 1 each 1  . metoprolol tartrate (LOPRESSOR) 25 MG tablet Take 0.5 tablets (12.5 mg total) by mouth daily. 90 tablet 0  . nitroGLYCERIN (NITROSTAT) 0.4 MG SL tablet Place 1 tablet (0.4 mg total) under the tongue every 5 (five) minutes as needed for chest pain. 50 tablet 1  . OneTouch Delica Lancets 15Q MISC USE TO CHECK BLOOD SUGAR DAILY AND PRN 100 each 4  . ONETOUCH ULTRA test strip USE TO CHECK BLOOD SUGAR DAILY E11.9 100 strip 4  . oxcarbazepine (TRILEPTAL) 600 MG tablet TAKE HALF TABLET BY MOUTH TWICE DAILY 90 tablet 1  . rOPINIRole (REQUIP) 0.5 MG tablet TAKE 1 TABLET BY MOUTH AT BEDTIME. 90 tablet 1  . rosuvastatin (CRESTOR) 10 MG tablet Take 2 tablets (20 mg  total) by mouth daily. 90 tablet 1  . tamsulosin (FLOMAX) 0.4 MG CAPS capsule TAKE 1 CAPSULE BY MOUTH EVERY DAY AFTER SUPPER 90 capsule 2  . tizanidine (ZANAFLEX) 2 MG capsule TAKE 3 CAPSULES BY MOUTH EVERY DAY AT BEDTIME 270 capsule 1   Current Facility-Administered Medications on File Prior to Visit  Medication Dose Route Frequency Provider Last Rate Last Admin  . triamcinolone acetonide (KENALOG-40) injection 40 mg  40 mg Intramuscular Once [name], Andrew Bise G, MD       Past Medical History:  Diagnosis Date  . BACK PAIN, CHRONIC 04/18/2009  . BENIGN PROSTATIC HYPERTROPHY, HX OF 01/19/2007  . Carpal tunnel syndrome 03/24/2007  . CORONARY ARTERY DISEASE 01/19/2007  . DEPRESSION, CHRONIC 06/24/2007  . DIABETES MELLITUS 01/19/2007  . DIABETES  MELLITUS, TYPE II, WITH NEUROLOGICAL COMPLICATIONS 10/16/6431  . Diarrhea 02/23/2007  . HEART MURMUR, SYSTOLIC 2/95/1884  . HERPES SIMPLEX INFECTION 01/19/2007  . HYPERLIPIDEMIA 01/19/2007  . HYPERTENSION 01/19/2007  . Intermediate coronary syndrome ([place]) 08/29/2008  . LEG CRAMPS, NOCTURNAL 10/28/2007  . Leg cramps, sleep related 08/05/2010  . OBSTRUCTIVE SLEEP APNEA 01/19/2007   does not use CPAP  . OSTEOARTHRITIS 06/24/2007  . Other postprocedural status(V45.89) 01/19/2007  . PERCUTANEOUS TRANSLUMINAL CORONARY ANGIOPLASTY, HX OF 01/19/2007  . PLANTAR FASCIITIS, RIGHT 01/19/2007  . SHOULDER PAIN, LEFT 03/24/2007  . SPINAL STENOSIS, LUMBAR 06/03/2009  . TINEA PEDIS 11/15/2009  . URI 01/11/2009   No Known Allergies  Social History   Socioeconomic History  . Marital status: Married    Spouse name: Not on file  . Number of children: Not on file  . Years of education: Not on file  . Highest education level: Not on file  Occupational History  . Occupation: retired    Comment: Truck Research officer, political party  Tobacco Use  . Smoking status: Former Smoker    Quit date: 04/14/1983    Years since quitting: 37.4  . Smokeless tobacco: Never Used  . Tobacco comment: stopped 1985   Vaping Use  . Vaping Use: Never used  Substance and Sexual Activity  . Alcohol use: Yes    Comment: OCCASSIONAL  . Drug use: Not Currently    Types: Marijuana    Comment: occ  . Sexual activity: Not on file  Other Topics Concern  . Not on file  Social History Narrative   Married in 1959 - 28yr, divorced; married 1974 - 1.5 years, divorced; married 1985 1 daughter [phone] daughters; 2 grandchildren.   Social Determinants of Health   Financial Resource Strain: Low Risk   . Difficulty of Paying Living Expenses: Not hard at all  Food Insecurity: No Food Insecurity  . Worried About RCharity fundraiserin the Last Year: Never true  . Ran Out of Food in the Last Year: Never true  Transportation Needs: No Transportation Needs  . Lack of Transportation (Medical): No  . Lack of Transportation (Non-Medical): No  Physical Activity: Inactive  . Days of Exercise per Week: 0 days  . Minutes of Exercise per Session: 0 min  Stress: No Stress Concern Present  . Feeling of Stress : Only a little  Social Connections: Moderately Isolated  . Frequency of Communication with Friends and Family: More than three times a week  . Frequency of Social Gatherings with Friends and Family: Twice a week  . Attends Religious Services: Never  . Active Member of Clubs or Organizations: No  . Attends CArchivistMeetings: Never  . Marital Status: Married   Vitals:   09/17/20 1352  BP: 126/60  Pulse: 70  Resp: 16  SpO2: 97%   Body mass index is 28.17 kg/m.  Physical Exam Vitals and nursing note reviewed.  Constitutional:      General: He is not in acute distress.    Appearance: He is well-developed.  HENT:     Head: Normocephalic and atraumatic.     Mouth/Throat:     Mouth: Mucous membranes are moist.     Pharynx: Oropharynx is clear.  Eyes:     Conjunctiva/sclera: Conjunctivae normal.  Cardiovascular:     Rate and Rhythm: Normal rate and regular rhythm.     Heart sounds:  Murmur (Murmur (Dyastolic II/VI RUSB, SEM II/VI LUSB)) heard.    Pulmonary:  Effort: Pulmonary effort is normal. No respiratory distress.     Breath sounds: Normal breath sounds.  Abdominal:     Palpations: Abdomen is soft. There is no mass.     Tenderness: There is no abdominal tenderness.  Musculoskeletal:     Right lower leg: 2+ Pitting Edema present.     Left lower leg: 2+ Pitting Edema present.  Lymphadenopathy:     Cervical: No cervical adenopathy.  Skin:    General: Skin is warm.     Findings: No erythema or rash.  Neurological:     General: No focal deficit present.     Mental Status: He is alert and oriented to person, place, and time.     Comments: Antalgic gait, assisted with a cane.  Psychiatric:     Comments: Well groomed, good eye contact.   ASSESSMENT AND PLAN:  Mr.Jovane was seen today for follow-up.  Diagnoses and all orders for this visit:  Type 2 diabetes mellitus with diabetic neuropathy, without long-term current use of insulin ([place]) He agrees with changing Glipizide for Jardiance 10 mg daily. Continue monitoring BS's. Appropriate foot care and annual eye exam to continue.  -     empagliflozin (JARDIANCE) 10 MG TABS tablet; Take 1 tablet (10 mg total) by mouth daily before breakfast.  Atherosclerosis of native coronary artery of native heart with unstable angina pectoris ([place]) Reluctant to seeing cardiologist, agrees with keeping appt for this Friday. Continue Aspirin 81 mg daily and Crestor 20 mg daily. Clearly instructed about warning signs. No changes in Imdur or Metoprolol tartrate dose. Clearly instructed about warning signs.  Chest pain, unspecified type We discussed differential Dx, not having symptoms at this time.  Cramp of limb Chronic. We discussed some side effects of Zanaflex. She does not want to take Trileptal, did not feel like medication helped much. He can take Zanaflex up to 8 mg daily. He is not sure of Requip is helping,  so recommended decreasing dose from 0.5 mg to 0.25 mg aily. Fall precautions.  Chronic obstructive pulmonary disease, unspecified COPD type ([place]) Problem is well controlled. Continue Albuterol inh 2 puff qid prn.  Mild episode of recurrent major depressive disorder (HCC) Exacerbated by some of his chronic medical problem, specially OA and BPH. He has not been interested in pharmacologic treatment or CBT. We will continue following.   Spent 47 minutes. During this time history was obtained and documented, examination was performed, prior labs/imaging reviewed, and assessment/plan discussed.  Return in about 3 months (around 12/18/2020) for DM II,HTN.   Andrew Erway G. [name], MD  [facility]. [place] office.   A few things to remember from today's visit:   No diagnosis found.  If you need refills please call your pharmacy. Do not use My Chart to request refills or for acute issues that need immediate attention.   Zanaflex up to 4 tabs daily for cramps. Try to decrease dose of Requip (Ropinirol) from 0.5 mg to 0.25 mg and will continue weaning off if it is not helping with cramps.  Keep appt with cardiologist. If chest pain does not resolve with nitro x 1,you call 911.  Avoid physical activity for now. Stop glipizide and start samples of jardiance.  Please be sure medication list is accurate. If a new problem present, please set up appointment sooner than planned today.

## 2020-09-17 NOTE — Patient Instructions (Addendum)
A few things to remember from today's visit:   No diagnosis found.  If you need refills please call your pharmacy. Do not use My Chart to request refills or for acute issues that need immediate attention.   Zanaflex up to 4 tabs daily for cramps. Try to decrease dose of Requip (Ropinirol) from 0.5 mg to 0.25 mg and will continue weaning off if it is not helping with cramps.  Keep appt with cardiologist. If chest pain does not resolve with nitro x 1,you call 911.  Avoid physical activity for now. Stop glipizide and start samples of jardiance.  Please be sure medication list is accurate. If a new problem present, please set up appointment sooner than planned today.

## 2020-09-20 ENCOUNTER — Ambulatory Visit: Payer: Medicare HMO | Admitting: Cardiology

## 2020-09-28 ENCOUNTER — Encounter: Payer: Self-pay | Admitting: Family Medicine

## 2020-09-30 MED ORDER — EMPAGLIFLOZIN 10 MG PO TABS: 10.0000 mg | ORAL_TABLET | Freq: Every day | ORAL | 3 refills | Status: DC

## 2020-10-01 ENCOUNTER — Encounter: Payer: Self-pay | Admitting: Family Medicine

## 2020-10-07 ENCOUNTER — Encounter: Payer: Self-pay | Admitting: Family Medicine

## 2020-10-10 ENCOUNTER — Other Ambulatory Visit: Payer: Self-pay | Admitting: Family Medicine

## 2020-10-10 MED ORDER — GLIPIZIDE 5 MG PO TABS: 5.0000 mg | ORAL_TABLET | Freq: Every day | ORAL | 1 refills | Status: DC

## 2020-10-19 ENCOUNTER — Other Ambulatory Visit: Payer: Self-pay | Admitting: Family Medicine

## 2020-10-19 DIAGNOSIS — I25110 Atherosclerotic heart disease of native coronary artery with unstable angina pectoris: Secondary | ICD-10-CM

## 2020-10-19 DIAGNOSIS — I119 Hypertensive heart disease without heart failure: Secondary | ICD-10-CM

## 2020-10-20 ENCOUNTER — Other Ambulatory Visit: Payer: Self-pay | Admitting: Family Medicine

## 2020-10-20 DIAGNOSIS — E782 Mixed hyperlipidemia: Secondary | ICD-10-CM

## 2020-10-20 DIAGNOSIS — I25110 Atherosclerotic heart disease of native coronary artery with unstable angina pectoris: Secondary | ICD-10-CM

## 2020-10-21 ENCOUNTER — Other Ambulatory Visit: Payer: Self-pay | Admitting: Family Medicine

## 2020-10-21 DIAGNOSIS — R252 Cramp and spasm: Secondary | ICD-10-CM

## 2020-10-28 ENCOUNTER — Other Ambulatory Visit: Payer: Self-pay | Admitting: Family Medicine

## 2020-10-28 DIAGNOSIS — N401 Enlarged prostate with lower urinary tract symptoms: Secondary | ICD-10-CM

## 2020-11-09 ENCOUNTER — Other Ambulatory Visit: Payer: Self-pay | Admitting: Family Medicine

## 2020-11-09 DIAGNOSIS — I25110 Atherosclerotic heart disease of native coronary artery with unstable angina pectoris: Secondary | ICD-10-CM

## 2020-11-09 DIAGNOSIS — I119 Hypertensive heart disease without heart failure: Secondary | ICD-10-CM

## 2020-11-14 ENCOUNTER — Telehealth: Payer: Self-pay | Admitting: Family Medicine

## 2020-11-14 NOTE — Chronic Care Management (AMB) (Signed)
  Chronic Care Management   Note  11/14/2020 Name: [name] MRN: [id number] DOB: Aug 10, 1931  [name] is a 87 y.o. year old male who is a primary care patient of [name], Malka So, MD. I reached out to Marcille Buffy by phone today in response to a referral sent by Mr. [place] PCP, [name], [name], MD.   Mr. Book was given information about Chronic Care Management services today including:  CCM service includes personalized support from designated clinical staff supervised by his physician, including individualized plan of care and coordination with other care providers 24/7 contact phone numbers for assistance for urgent and routine care needs. Service will only be billed when office clinical staff spend 20 minutes or more in a month to coordinate care. Only one practitioner may furnish and bill the service in a calendar month. The patient may stop CCM services at any time (effective at the end of the month) by phone call to the office staff.   Patient agreed to services and verbal consent obtained.   Follow up plan:   Tatjana Secretary/administrator

## 2020-11-15 ENCOUNTER — Telehealth: Payer: Self-pay | Admitting: Pharmacist

## 2020-11-15 NOTE — Chronic Care Management (AMB) (Signed)
Chronic Care Management [facility]   Name: [name]  MRN: [id number] DOB: October 27, 1931  Reason for Encounter: Chart review for Initial visit with [name] Pharmacist on 11-19-2020 via phone call at 9 am   Conditions to be addressed/monitored: HTN, HLD, COPD, and DMII   Recent office visits:  09-17-2020 [name], [name], MD - Patient presented for Type 2 diabetes mellitus with diabetic neuropathy, without long-term current use of insulin And other issues. Prescribed Jardiance 10 mg daily. Stopped Glipizide 5 mg.   08-13-2020 [name], [name], MD - Patient presented for Atherosclerosis of native coronary artery of native heart with unstable angina pectoris and other concerns. Prescribed Isosorbide mononitrate 30 mg daily. Changed  Furosemide to 40 mg twice daily-  Metoprolol tartrate to 12.5 mg daily and Rosuvastatin to 20 mg daily.   Recent consult visits:  07-15-2020 Tat, Andrew Quail, DO (Neurology) - Patient presented for leg cramps. Stopped Docusate Sodium and Stopped Triamcinolone.  07-02-2020 [name] ( Ophthalmology )- Patient presented for pre-glaucoma. No medication changes found.   Hospital visits:  None in previous 6 months  Medications: Outpatient Encounter Medications as of 11/15/2020  Medication Sig   albuterol (VENTOLIN HFA) 108 (90 Base) MCG/ACT inhaler Inhale 2 puffs into the lungs every 6 (six) hours as needed.   Blood Glucose Monitoring Suppl (ACCU-CHEK AVIVA CONNECT) w/Device KIT 1 Device by Does not apply route daily.   Cyanocobalamin (B-12) 1000 MCG TABS Take 1,000 mcg by mouth daily.   empagliflozin (JARDIANCE) 10 MG TABS tablet Take 1 tablet (10 mg total) by mouth daily before breakfast.   finasteride (PROSCAR) 5 MG tablet TAKE 1 TABLET BY MOUTH EVERY DAY   furosemide (LASIX) 40 MG tablet Take 1 tablet (40 mg total) by mouth 2 (two) times daily.   glipiZIDE (GLUCOTROL) 5 MG tablet Take 1 tablet (5 mg total) by mouth daily before breakfast.  20-30 minutes before meal.   isosorbide mononitrate (IMDUR) 30 MG 24 hr tablet TAKE 1 TABLET BY MOUTH EVERY DAY   Lancet Devices (LANCET DEVICE WITH EJECTOR) MISC 1 Device by Does not apply route daily.   metoprolol tartrate (LOPRESSOR) 25 MG tablet TAKE 1 TABLET BY MOUTH EVERY DAY   nitroGLYCERIN (NITROSTAT) 0.4 MG SL tablet Place 1 tablet (0.4 mg total) under the tongue every 5 (five) minutes as needed for chest pain.   OneTouch Delica Lancets 78H MISC USE TO CHECK BLOOD SUGAR DAILY AND PRN   ONETOUCH ULTRA test strip USE TO CHECK BLOOD SUGAR DAILY E11.9   oxcarbazepine (TRILEPTAL) 600 MG tablet TAKE HALF TABLET BY MOUTH TWICE DAILY   rOPINIRole (REQUIP) 0.5 MG tablet TAKE 1 TABLET BY MOUTH AT BEDTIME.   rosuvastatin (CRESTOR) 10 MG tablet TAKE 1 TABLET BY MOUTH EVERY DAY   tamsulosin (FLOMAX) 0.4 MG CAPS capsule TAKE 1 CAPSULE BY MOUTH EVERY DAY AFTER SUPPER   tizanidine (ZANAFLEX) 2 MG capsule Take 4 capsules (8 mg total) by mouth at bedtime as needed for muscle spasms.   Facility-Administered Encounter Medications as of 11/15/2020  Medication   triamcinolone acetonide (KENALOG-40) injection 40 mg    Have you seen any other providers since your last visit? Patient reports no  Any changes in your medications or health? Patient reports no  Any side effects from any medications? Patient reports no  Do you have an symptoms or problems not managed by your medications? Patient reports no  Any concerns about your health right now? Patient reports no  Has your provider asked that you check blood pressure, blood sugar, or follow special diet at home? Patient reports he checks it daily and is keeping a record yesterday morning  134 on the 7th was 108. Patient reports he does not eat very much for breakfast he enjoys eggs. For lunches he will have chilli with vegetables and bread , for dinner he will have fish or chicken. He watches his salt intake and is drinking water or tonic water, tea and  coffee unsweetened and milk.   Do you get any type of exercise on a regular basis? He reports he is not real active has walker and cane most time does not use it, fell recently but unsure of the date  Can you think of a goal you would like to reach for your health? Would like to still be able to do activities like fishing, and better mobility.   Do you have any problems getting your medications? Patient reports his pharmacy is frustrating with fluctuating prices, although he is managing it and no other issues.   Is there anything that you would like to discuss during the appointment? Patient reports he has trouble remembering words.   Patient aware to have medications and supplements and logs for call.   Fill History : FINASTERIDE 5MG TAB 10/28/2020 90   FUROSEMIDE 40MG TAB 11/10/2020 90   ONETOUCH ULTRA TEST STRIP 09/25/2020 90   ISOSORBIDE MONONITRATE ER 30MG ER TAB 11/11/2020 90   ONETOUCH DELICA 45W LANCETS 09/81/1914 90   METOPROLOL TARTRATE 25 MG TAB 10/21/2020 90   NITROGLYCERIN 0.4 MG TABLET SL 08/05/2020 10   OXCARBAZEPINE 600MG TAB 07/04/2020 90   ROSUVASTATIN CALCIUM 10 MG TAB 10/21/2020 90   TAMSULOSIN HCL 0.4 MG CAPSULE 10/19/2020 90   GLIPIZIDE 5 MG TABLET 10/10/2020 90   ROPINIROLE HCL 0.5 MG TABLET 10/20/2020 90   TIZANIDINE HCL 2 MG CAPSULE 10/23/2020 90   Care Gaps:  AWV - scheduled for 12-18-2020 Zoster Vaccine - Overdue Foot Exam- Overdue Eye Exam - Overdue Flu Vaccine - Overdue  Star Rating Drugs: Rosuvastatin 1m - Last filled 10-21-2020 90 [facility] at [facility] Glipizide 537m- Last filled 10-10-2020 90 DS at [facility] Empafliflozin 1039m Patient has not filled due to price has been taking Glipizide per notes with PCP in chart.    LarRosemeadinical Pharmacist Gamble [phone]

## 2020-11-19 ENCOUNTER — Ambulatory Visit (INDEPENDENT_AMBULATORY_CARE_PROVIDER_SITE_OTHER): Payer: Medicare HMO | Admitting: Pharmacist

## 2020-11-19 DIAGNOSIS — E1140 Type 2 diabetes mellitus with diabetic neuropathy, unspecified: Secondary | ICD-10-CM | POA: Diagnosis not present

## 2020-11-19 DIAGNOSIS — E782 Mixed hyperlipidemia: Secondary | ICD-10-CM | POA: Diagnosis not present

## 2020-11-19 NOTE — Progress Notes (Signed)
Chronic Care Management Pharmacy Note  12/06/2020 Name:  [name] MRN:  [id number] DOB:  February 19, 1932  Summary: A1c not at goal <7% LDL not at goal < 70  Recommendations/Changes made from today's visit: -Recommended for patient to increase rosuvastatin to 2 tablets daily as he does not believe he tried this -Recommended trial of capsaicin cream for joint pain  Plan: Tolerance assessment in 3-4 weeks for rosuvastatin Plan for PAP for SGLT2  Subjective: [name] is an 87 y.o. year old male who is a primary patient of [name], Malka So, MD.  The CCM team was consulted for assistance with disease management and care coordination needs.    Engaged with patient by telephone for initial visit in response to provider referral for pharmacy case management and/or care coordination services.   Consent to Services:  The patient was given the following information about Chronic Care Management services today, agreed to services, and gave verbal consent: 1. CCM service includes personalized support from designated clinical staff supervised by the primary care provider, including individualized plan of care and coordination with other care providers 2. 24/7 contact phone numbers for assistance for urgent and routine care needs. 3. Service will only be billed when office clinical staff spend 20 minutes or more in a month to coordinate care. 4. Only one practitioner may furnish and bill the service in a calendar month. 5.The patient may stop CCM services at any time (effective at the end of the month) by phone call to the office staff. 6. The patient will be responsible for cost sharing (co-pay) of up to 20% of the service fee (after annual deductible is met). Patient agreed to services and consent obtained.  Patient Care Team: [name], [name], MD as PCP - General (Family Medicine) Viona Gilmore, [facility] as Pharmacist (Pharmacist)  Recent office visits: 09-17-2020 [name], [name], MD - Patient  presented for Type 2 diabetes mellitus with diabetic neuropathy, without long-term current use of insulin And other issues. Prescribed Jardiance 10 mg daily. Stopped Glipizide 5 mg.  08-13-2020 [name], [name], MD - Patient presented for Atherosclerosis of native coronary artery of native heart with unstable angina pectoris and other concerns. Prescribed Isosorbide mononitrate 30 mg daily. Changed  Furosemide to 40 mg twice daily-  Metoprolol tartrate to 12.5 mg daily and Rosuvastatin to 20 mg daily.   Recent consult visits: 07-15-2020 Tat, Eustace Quail, DO (Neurology) - Patient presented for leg cramps. Stopped Docusate Sodium and Stopped Triamcinolone.   07-02-2020 Oliver Barre ( Ophthalmology )- Patient presented for pre-glaucoma. No medication changes found.   Hospital visits: None in previous 6 months   Objective:  Lab Results  Component Value Date   CREATININE 0.67 08/13/2020   BUN 20 08/13/2020   GFR 84.56 08/13/2020   GFRNONAA >89 08/12/2015   GFRAA >89 08/12/2015   NA 140 08/13/2020   K 4.2 08/13/2020   CALCIUM 9.5 08/13/2020   CO2 28 08/13/2020   GLUCOSE 187 (H) 08/13/2020    Lab Results  Component Value Date/Time   HGBA1C 7.5 (H) 08/13/2020 12:54 PM   HGBA1C 6.7 04/26/2020 12:29 PM   HGBA1C 6.7 (A) 11/21/2019 11:12 AM   HGBA1C 6.4 08/15/2019 11:55 AM   FRUCTOSAMINE 302 (H) 08/13/2020 12:54 PM   FRUCTOSAMINE 269 04/26/2020 01:08 PM   GFR 84.56 08/13/2020 12:54 PM   GFR 80.62 04/26/2020 01:08 PM   MICROALBUR 5.3 (H) 04/26/2020 01:08 PM   MICROALBUR 2.3 (H) 01/30/2019 11:42 AM  Last diabetic Eye exam:  Lab Results  Component Value Date/Time   HMDIABEYEEXA No Retinopathy 08/18/2013 12:00 AM    Last diabetic Foot exam: No results found for: HMDIABFOOTEX   Lab Results  Component Value Date   CHOL 242 (H) 04/26/2020   HDL 47.40 04/26/2020   LDLCALC 124 (H) 01/30/2019   LDLDIRECT 121.0 04/26/2020   TRIG 281.0 (H) 04/26/2020   CHOLHDL 5 04/26/2020    Hepatic  Function Latest Ref Rng & Units 04/26/2020 03/29/2018 04/23/2017  Total Protein 6.0 - 8.3 g/dL 6.8 6.6 6.5  Albumin 3.5 - 5.2 g/dL 4.7 4.6 4.4  AST 0 - 37 U/L _0 ALT 0 - 53 U/L _1 Alk Phosphatase 39 - 117 U/L 80 81 71  Total Bilirubin 0.2 - 1.2 mg/dL 0.5 0.5 0.8  Bilirubin, Direct 0.0 - 0.3 mg/dL - - -    Lab Results  Component Value Date/Time   TSH 2.36 08/13/2020 12:54 PM   TSH 1.68 04/01/2017 02:27 PM    CBC Latest Ref Rng & Units 08/13/2020 03/21/2020 04/23/2017  WBC 4.0 - 10.5 K/uL 5.4 5.5 4.2  Hemoglobin 13.0 - 17.0 g/dL 14.5 14.2 15.0  Hematocrit 39.0 - 52.0 % 42.0 42.0 44.4  Platelets 150.0 - 400.0 K/uL 158.0 169.0 133.0(L)    No results found for: VD25OH  Clinical ASCVD: Yes  The ASCVD Risk score Mikey Bussing DC Jr., et al., 2013) failed to calculate for the following reasons:   The 2013 ASCVD risk score is only valid for ages 35 to 84    Depression screen PHQ 2/9 09/17/2020 12/06/2019 11/08/2018  Decreased Interest 2 0 2  Down, Depressed, Hopeless _2 PHQ - 2 Score _3 Altered sleeping 2 - 2  Tired, decreased energy 2 - 3  Change in appetite 2 - 2  Feeling bad or failure about yourself  0 - 3  Trouble concentrating 1 - 2  Moving slowly or fidgety/restless 2 - 0  Suicidal thoughts 1 - 0  PHQ-9 Score 13 - 16  Difficult doing work/chores Somewhat difficult - Somewhat difficult  Some recent data might be hidden    No flowsheet data found.  No flowsheet data found.    Social History   Tobacco Use  Smoking Status Former   Types: Cigarettes   Quit date: 04/14/1983   Years since quitting: 37.6  Smokeless Tobacco Never  Tobacco Comments   stopped 1985   BP Readings from Last 3 Encounters:  09/17/20 126/60  08/13/20 130/78  04/26/20 126/70   Pulse Readings from Last 3 Encounters:  09/17/20 70  08/13/20 (!) 54  04/26/20 78   Wt Readings from Last 3 Encounters:  09/17/20 159 lb (72.1 kg)  08/13/20 160 lb 9.6 oz (72.8 kg)  07/15/20 159 lb (72.1  kg)   BMI Readings from Last 3 Encounters:  09/17/20 28.17 kg/m  08/13/20 28.45 kg/m  07/15/20 28.17 kg/m    Assessment/Interventions: Review of patient past medical history, allergies, medications, health status, including review of consultants reports, laboratory and other test data, was performed as part of comprehensive evaluation and provision of chronic care management services.   SDOH:  (Social Determinants of Health) assessments and interventions performed: Yes SDOH Interventions    Flowsheet Row Most Recent Value  SDOH Interventions   Financial Strain Interventions Other (Comment)  [patient assistance]  Transportation Interventions Intervention Not Indicated      SDOH Screenings   Alcohol Screen: Not  on file  Depression (PHQ2-9): Medium Risk   PHQ-2 Score: 13  Financial Resource Strain: Medium Risk   Difficulty of Paying Living Expenses: Somewhat hard  Food Insecurity: Not on file  Housing: Not on file  Physical Activity: Not on file  Social Connections: Not on file  Stress: Not on file  Tobacco Use: Medium Risk   Smoking Tobacco Use: Former   Smokeless Tobacco Use: Never  Transportation Needs: No Transportation Needs   Lack of Transportation (Medical): No   Lack of Transportation (Non-Medical): No   Patient goes to bed late 2-3am and gets up at 9 am and he cannot sleep for 8 hours. He doesn't eat until 2pm and sometimes has trouble remembering to take the glipizide before a meal.  Patient tries to exercise a bit but his knees are pretty bad and still walks and often without a cane. Patient doesn't walk a lot and maybe walks to his mailbox a few times a week. Patient tries to be active but is limited. Patient enjoys reading for long periods of time. Patient gets tired easily and has very bad cramps in arms and legs.  Patient tries to eat "better" now. He loves spicy foods. He eats cayenne peppers on everything. He eats a lot of eggs and pork and beef, fish and  lamb. He tries to eats vegetables. He wife is from [place] and they eat arepas often. He does eat some take out, sometimes weeks without takeout.  Patient has a bit of CHF and this limits him. He doesn't have a lot of friends that are alive and his wife is 91 years younger. Patient had an older sister who passed away.  Patient has a daughter 15 minutes away and gets to see her and grandchildren often. His grandchildren are a real pleasure and they are 19 and 23 and both girls.  Patient denies any issues with his current medications. Patient takes the medications because he's told to. Patient knows what all of his medications are for.  CCM Care Plan  No Known Allergies  Medications Reviewed Today     Reviewed by Viona Gilmore, [facility] (Pharmacist) on 11/19/20 at 1024  Med List Status: <None>   Medication Order Taking? Sig Documenting Provider Last Dose Status Informant  acetaminophen (TYLENOL) 500 MG tablet [id number] Yes Take 500 mg by mouth every 6 (six) hours as needed. [provider] Taking Active   albuterol (VENTOLIN HFA) 108 (90 Base) MCG/ACT inhaler [id number] Yes Inhale 2 puffs into the lungs every 6 (six) hours as needed. [name], [name], MD Taking Active   Blood Glucose Monitoring Suppl (ACCU-CHEK AVIVA CONNECT) w/Device KIT [id number]  1 Device by Does not apply route daily. [name], [name], MD  Active   Cyanocobalamin (B-12) 1000 MCG TABS [id number] Yes Take 1,000 mcg by mouth daily. [provider] Taking Active   empagliflozin (JARDIANCE) 10 MG TABS tablet [id number] No Take 1 tablet (10 mg total) by mouth daily before breakfast.  Patient not taking: Reported on 11/19/2020   [name], [name], MD Not Taking Active   finasteride (PROSCAR) 5 MG tablet [id number] Yes TAKE 1 TABLET BY MOUTH EVERY DAY [name], [name], MD Taking Active   furosemide (LASIX) 40 MG tablet [id number] Yes Take 1 tablet (40 mg total) by mouth 2 (two) times daily.  Patient taking differently:  Take 40 mg by mouth daily.   [name], [name], MD Taking Active   glipiZIDE (GLUCOTROL) 5 MG tablet [id number] Yes Take  1 tablet (5 mg total) by mouth daily before breakfast. 20-30 minutes before meal. [name], [name], MD Taking Active   isosorbide mononitrate (IMDUR) 30 MG 24 hr tablet [id number] Yes TAKE 1 TABLET BY MOUTH EVERY DAY [name], [name], MD Taking Active   Lancet Devices (LANCET DEVICE WITH EJECTOR) [place] [id number]  1 Device by Does not apply route daily. [name], [name], MD  Active   loperamide (IMODIUM A-D) 2 MG tablet [id number] Yes Take 2 mg by mouth 4 (four) times daily as needed for diarrhea or loose stools. [provider] Taking Active   metoprolol tartrate (LOPRESSOR) 25 MG tablet [id number] Yes TAKE 1 TABLET BY MOUTH EVERY DAY [name], [name], MD Taking Active   nitroGLYCERIN (NITROSTAT) 0.4 MG SL tablet [id number] Yes Place 1 tablet (0.4 mg total) under the tongue every 5 (five) minutes as needed for chest pain. [name], [name], MD Taking Active   OneTouch Delica Lancets 94W MISC [id number]  USE TO CHECK BLOOD SUGAR DAILY AND PRN [name], [name], MD  Active   [facility] ULTRA test strip [id number]  USE TO CHECK BLOOD SUGAR DAILY E11.9 [name], [name], MD  Active   rOPINIRole (REQUIP) 0.5 MG tablet [id number] Yes TAKE 1 TABLET BY MOUTH AT BEDTIME. [name], [name], MD Taking Active   rosuvastatin (CRESTOR) 10 MG tablet [id number] Yes TAKE 1 TABLET BY MOUTH EVERY DAY [name], [name], MD Taking Active   tamsulosin (FLOMAX) 0.4 MG CAPS capsule [id number] Yes TAKE 1 CAPSULE BY MOUTH EVERY DAY AFTER SUPPER [name], [name], MD Taking Active   tizanidine (ZANAFLEX) 2 MG capsule [id number] Yes Take 4 capsules (8 mg total) by mouth at bedtime as needed for muscle spasms. [name], [name], MD Taking Active   triamcinolone acetonide [facility]) injection 40 mg [id number]   [name], [name], MD  Active             Patient Active Problem List   Diagnosis Date Noted   Urine incontinence  03/29/2018   Bilateral lower extremity edema 03/29/2018   Chronic otitis externa of both ears 03/29/2018   Forgetfulness 12/17/2017   Peripheral neuropathy 10/30/2016   Chronic fatigue 10/30/2016   Knee osteoarthritis 07/26/2016   Chronic diarrhea 10/16/2015   Murmur 08/29/2015   Sinus bradycardia 08/29/2015   Myalgia due to statin 08/29/2015   Cramp of both lower extremities 08/29/2015   Insomnia 08/12/2015   (HFpEF) heart failure with preserved ejection fraction ([place]) 07/22/2015   COPD (chronic obstructive pulmonary disease) ([place]) 07/22/2015   Spondylolisthesis of lumbar region 05/02/2013   Cervical spine disease 04/16/2013   Advanced care planning/counseling discussion 03/02/2012   BPH with urinary obstruction 03/22/2011   Diabetes mellitus type 2 with neurological manifestations ([place]) 06/17/2010   SPINAL STENOSIS, LUMBAR 06/03/2009   BACK PAIN, CHRONIC 04/18/2009   HEART MURMUR, SYSTOLIC 61/95/0932   Cramp of limb 10/28/2007   Depression, major, recurrent, moderate ([place]) 06/24/2007   Generalized osteoarthritis of multiple sites 06/24/2007   CARPAL TUNNEL SYNDROME 03/24/2007   HERPES SIMPLEX INFECTION 01/19/2007   Type 2 diabetes mellitus with diabetic neuropathy, unspecified ([place]) 01/19/2007   Hyperlipidemia, mixed 01/19/2007   OBSTRUCTIVE SLEEP APNEA 01/19/2007   Hypertension with heart disease 01/19/2007   Coronary atherosclerosis 01/19/2007   BPH associated with nocturia 01/19/2007   PERCUTANEOUS TRANSLUMINAL CORONARY ANGIOPLASTY, HX OF 01/19/2007    Immunization History  Administered Date(s) Administered   Fluad Quad(high Dose 65+) 01/30/2019   Influenza Split 01/12/2011, 01/18/2012   Influenza Whole  02/23/2007, 02/01/2008, 01/11/2009, 01/16/2010   Influenza, High Dose Seasonal PF 03/02/2016, 01/15/2017, 12/21/2017   Influenza-Unspecified 01/11/2013, 12/12/2013   PFIZER Comirnaty(Gray Top)Covid-19 Tri-Sucrose Vaccine 09/04/2020   PFIZER(Purple Top)SARS-COV-2  Vaccination 05/02/2019, 05/22/2019, 02/16/2020   Pneumococcal Conjugate-13 01/26/2013   Pneumococcal Polysaccharide-23 07/15/1993   Td 04/04/2009   Zoster, Live 02/12/2012    Conditions to be addressed/monitored:  Hypertension, Hyperlipidemia, Diabetes, Heart Failure, COPD, and BPH  Care Plan : [place]  Updates made by Viona Gilmore, [place] since 12/06/2020 12:00 AM     Problem: Problem:Hypertension, Hyperlipidemia, Diabetes, Heart Failure, COPD, and BPH      Long-Range Goal: Patient-Specific Goal   Start Date: 11/19/2020  Expected End Date: 11/19/2021  This Visit's Progress: On track  Priority: High  Note:   Current Barriers:  Unable to independently afford treatment regimen Unable to independently monitor therapeutic efficacy Unable to achieve control of cholesterol  Unable to maintain control of diabetes  Pharmacist Clinical Goal(s):  Patient will verbalize ability to afford treatment regimen achieve adherence to monitoring guidelines and medication adherence to achieve therapeutic efficacy achieve control of cholesterol as evidenced by lipid panel maintain control of diabetes as evidenced by A1c  through collaboration with PharmD and provider.   Interventions: 1:1 collaboration with [name], [name], MD regarding development and update of comprehensive plan of care as evidenced by provider attestation and co-signature Inter-disciplinary care team collaboration (see longitudinal plan of care) Comprehensive medication review performed; medication list updated in electronic medical record  Hypertension (BP goal <140/90) -Controlled -Current treatment: Furosemide 40 mg 1 tablet twice daily - only taking once a day lately Metoprolol tartrate 25 mg 1/2 tablet every day -Medications previously tried: n/a  -Current home readings: has an arm cuff that is older and does not use -Current dietary habits: doesn't eat canned vegetables -Current exercise habits:  limited -Denies hypotensive/hypertensive symptoms -Educated on BP goals and benefits of medications for prevention of heart attack, stroke and kidney damage; Daily salt intake goal < 2300 mg; Exercise goal of 150 minutes per week; Importance of home blood pressure monitoring; Proper BP monitoring technique; -Counseled to monitor BP at home weekly, document, and provide log at future appointments -Counseled on diet and exercise extensively Recommended to continue current medication  Coronary atherosclerosis/chest pain/angina (Goal: prevent heart events and minimize chest pain) -Controlled -Current treatment  Nitroglycerin 0.4 mg SL tabs as needed Isosorbide mononitrate 30 mg 1 tablet daily -Medications previously tried: none  -Recommended to continue current medication   Hyperlipidemia: (LDL goal < 70) -Uncontrolled -Current treatment: Rosuvastatin 10 mg 1 tablet daily -Medications previously tried: simvastatin  -Current dietary patterns: eating "healthier"; not eating many donuts or greasy foods -Current exercise habits: minimal -Educated on Cholesterol goals;  Benefits of statin for ASCVD risk reduction; Importance of limiting foods high in cholesterol; Exercise goal of 150 minutes per week; -Counseled on diet and exercise extensively Recommended trial of increasing rosuvastatin to 2 tablets daily. Patient does not believe he ever tried this.  Diabetes (A1c goal <7%) -Uncontrolled -Current medications: Glipizide 5 mg 1 tablet daily (alternates with twice daily) -Medications previously tried: Geneticist, molecular (cost)  -Current home glucose readings fasting glucose: 134, 140, 108, 164 post prandial glucose: 158  -Denies hypoglycemic/hyperglycemic symptoms -Current meal patterns:  breakfast: refer to above  lunch: refer to above  dinner: refer to above snacks: ice cream (buys every 3-4 months); not eating a lot of sweets drinks: loves alcohol (recommended 1-2 glasses); water; no  soda only tonic  water;  -Current exercise: minimal -Educated on A1c and blood sugar goals; Prevention and management of hypoglycemic episodes; Benefits of routine self-monitoring of blood sugar; Carbohydrate counting and/or plate method -Counseled to check feet daily and get yearly eye exams -Counseled on diet and exercise extensively Recommended to continue current medication Mailed healthy plate handout and hypoglycemia handout.  Heart Failure (Goal: manage symptoms and prevent exacerbations) -Controlled -Last ejection fraction:  60-65% (Date: 08/31/17) -HF type: Diastolic -NYHA Class: III (marked limitation of activity) -AHA HF Stage: C (Heart disease and symptoms present) -Current treatment: Furosemide 40 mg 1 tablet twice daily Metoprolol tartrate 25 mg 1 tablet every day -Medications previously tried: none  -Current home BP/HR readings: does not check -Current dietary habits: does not eat canned vegetables -Current exercise habits: minimal -Educated on Importance of weighing daily; if you gain more than 3 pounds in one day or 5 pounds in one week, call cardiologist. Proper diuretic administration and potassium supplementation Importance of blood pressure control -Counseled on diet and exercise extensively Recommended to continue current medication  BPH (Goal: minimize symptoms of enlarged prostate) -Controlled -Current treatment  Finasteride 5 mg 1 tablet daily Tamsulosin 0.4 mg 1 capsule daily after supper -Medications previously tried: none  -Recommended to continue current medication  Lower extremity cramping (Goal: minimize pain with cramping) -Uncontrolled -Current treatment  Acetaminophen 650 mg 2 tablets every 8 hours as needed Ropinirole 0.5 mg 1 tablet at bedtime Tizanidine 2 mg 4 capsules at bedtime -Medications previously tried: oxcarbazepine (ineffective), diclofenac gel -Recommended trial of magnesium 400 mg daily if he has not tried before.    Health  Maintenance -Vaccine gaps:  shingrix, influenza -Current therapy:  Vitamin B12 1000 mcg daily -Educated on Cost vs benefit of each product must be carefully weighed by individual consumer -Patient is satisfied with current therapy and denies issues -Recommended to continue current medication  Patient Goals/Self-Care Activities Patient will:  - take medications as prescribed check glucose daily, document, and provide at future appointments check blood pressure weekly, document, and provide at future appointments target a minimum of 150 minutes of moderate intensity exercise weekly  Follow Up Plan: Telephone follow up appointment with care management team member scheduled for: 4 months       Medication Assistance:  Will reassess the need for Jardiance PAP at follow up.  Compliance/Adherence/Medication fill history: Care Gaps: Shingrix, influenza, foot exam, eye exam  Star-Rating Drugs: Rosuvastatin 50m - [facility] Glipizide 522m- Last filled 10-10-2020 [facility] Empafliflozin 1065m Patient has not filled due to price has been taking Glipizide per notes with PCP in chart.   Patient's preferred pharmacy is:  [id number] [place] [place] -[place]: [phone]: [phone] [address] -[place]: [phone]: [address]-[place] [address]: [phone]: [phone] pill box? Yes - in AM/PM (8 days) Pt endorses 80% compliance (glipizide) - metoprolol  We discussed: Benefits of medication synchronization, packaging and delivery as well as enhanced pharmacist oversight with [employer]. Patient decided to: Continue current medication management  strategy  Care Plan and Follow Up Patient Decision:  Patient agrees to Care Plan and Follow-up.  Plan:  Telephone follow up appointment with care management team member scheduled for:  4 months  Jeni Salles, PharmD, [place] Pharmacist [place] at [place] [phone]

## 2020-12-06 NOTE — Patient Instructions (Addendum)
Hi [name],  It was great to get to meet you over the telephone! Below is a summary of some of the topics we discussed.   Please reach out to me if you have any questions or need anything before our follow up!  Best, Maddie  Jeni Salles, PharmD, [place] [facility] [place]   Visit Information   Goals Addressed             This Visit's Progress    Track and Manage My Blood Pressure-Hypertension       Timeframe:  Short-Term Goal Priority:  Medium Start Date:                             Expected End Date:                       Follow Up Date 03/22/21    - check blood pressure weekly - choose a place to take my blood pressure (home, clinic or office, retail store) - write blood pressure results in a log or diary    Why is this important?   You won't feel high blood pressure, but it can still hurt your blood vessels.  High blood pressure can cause heart or kidney problems. It can also cause a stroke.  Making lifestyle changes like losing a little weight or eating less salt will help.  Checking your blood pressure at home and at different times of the day can help to control blood pressure.  If the doctor prescribes medicine remember to take it the way the doctor ordered.  Call the office if you cannot afford the medicine or if there are questions about it.     Notes:        Patient Care Plan: CCM Pharmacy Care Plan     Problem Identified: Problem:Hypertension, Hyperlipidemia, Diabetes, Heart Failure, COPD, and BPH      Long-Range Goal: Patient-Specific Goal   Start Date: 11/19/2020  Expected End Date: 11/19/2021  This Visit's Progress: On track  Priority: High  Note:   Current Barriers:  Unable to independently afford treatment regimen Unable to independently monitor therapeutic efficacy Unable to achieve control of cholesterol  Unable to maintain control of diabetes  Pharmacist Clinical Goal(s):  Patient will  verbalize ability to afford treatment regimen achieve adherence to monitoring guidelines and medication adherence to achieve therapeutic efficacy achieve control of cholesterol as evidenced by lipid panel maintain control of diabetes as evidenced by A1c  through collaboration with PharmD and provider.   Interventions: 1:1 collaboration with [name], [name], MD regarding development and update of comprehensive plan of care as evidenced by provider attestation and co-signature Inter-disciplinary care team collaboration (see longitudinal plan of care) Comprehensive medication review performed; medication list updated in electronic medical record  Hypertension (BP goal <140/90) -Controlled -Current treatment: Furosemide 40 mg 1 tablet twice daily - only taking once a day lately Metoprolol tartrate 25 mg 1/2 tablet every day -Medications previously tried: n/a  -Current home readings: has an arm cuff that is older and does not use -Current dietary habits: doesn't eat canned vegetables -Current exercise habits: limited -Denies hypotensive/hypertensive symptoms -Educated on BP goals and benefits of medications for prevention of heart attack, stroke and kidney damage; Daily salt intake goal < 2300 mg; Exercise goal of 150 minutes per week; Importance of home blood pressure monitoring; Proper BP monitoring technique; -Counseled to monitor BP at  home weekly, document, and provide log at future appointments -Counseled on diet and exercise extensively Recommended to continue current medication  Coronary atherosclerosis/chest pain/angina (Goal: prevent heart events and minimize chest pain) -Controlled -Current treatment  Nitroglycerin 0.4 mg SL tabs as needed Isosorbide mononitrate 30 mg 1 tablet daily -Medications previously tried: none  -Recommended to continue current medication   Hyperlipidemia: (LDL goal < 70) -Uncontrolled -Current treatment: Rosuvastatin 10 mg 1 tablet  daily -Medications previously tried: simvastatin  -Current dietary patterns: eating "healthier"; not eating many donuts or greasy foods -Current exercise habits: minimal -Educated on Cholesterol goals;  Benefits of statin for ASCVD risk reduction; Importance of limiting foods high in cholesterol; Exercise goal of 150 minutes per week; -Counseled on diet and exercise extensively Recommended trial of increasing rosuvastatin to 2 tablets daily. Patient does not believe he ever tried this.  Diabetes (A1c goal <7%) -Uncontrolled -Current medications: Glipizide 5 mg 1 tablet daily (alternates with twice daily) -Medications previously tried: Geneticist, molecular (cost)  -Current home glucose readings fasting glucose: 134, 140, 108, 164 post prandial glucose: 158  -Denies hypoglycemic/hyperglycemic symptoms -Current meal patterns:  breakfast: refer to above  lunch: refer to above  dinner: refer to above snacks: ice cream (buys every 3-4 months); not eating a lot of sweets drinks: loves alcohol (recommended 1-2 glasses); water; no soda only tonic water;  -Current exercise: minimal -Educated on A1c and blood sugar goals; Prevention and management of hypoglycemic episodes; Benefits of routine self-monitoring of blood sugar; Carbohydrate counting and/or plate method -Counseled to check feet daily and get yearly eye exams -Counseled on diet and exercise extensively Recommended to continue current medication Mailed healthy plate handout and hypoglycemia handout.  Heart Failure (Goal: manage symptoms and prevent exacerbations) -Controlled -Last ejection fraction:  60-65% (Date: 08/31/17) -HF type: Diastolic -NYHA Class: III (marked limitation of activity) -AHA HF Stage: C (Heart disease and symptoms present) -Current treatment: Furosemide 40 mg 1 tablet twice daily Metoprolol tartrate 25 mg 1 tablet every day -Medications previously tried: none  -Current home BP/HR readings: does not  check -Current dietary habits: does not eat canned vegetables -Current exercise habits: minimal -Educated on Importance of weighing daily; if you gain more than 3 pounds in one day or 5 pounds in one week, call cardiologist. Proper diuretic administration and potassium supplementation Importance of blood pressure control -Counseled on diet and exercise extensively Recommended to continue current medication  BPH (Goal: minimize symptoms of enlarged prostate) -Controlled -Current treatment  Finasteride 5 mg 1 tablet daily Tamsulosin 0.4 mg 1 capsule daily after supper -Medications previously tried: none  -Recommended to continue current medication  Lower extremity cramping (Goal: minimize pain with cramping) -Uncontrolled -Current treatment  Acetaminophen 650 mg 2 tablets every 8 hours as needed Ropinirole 0.5 mg 1 tablet at bedtime Tizanidine 2 mg 4 capsules at bedtime -Medications previously tried: oxcarbazepine (ineffective), diclofenac gel -Recommended trial of magnesium 400 mg daily if he has not tried before.    Health Maintenance -Vaccine gaps:  shingrix, influenza -Current therapy:  Vitamin B12 1000 mcg daily -Educated on Cost vs benefit of each product must be carefully weighed by individual consumer -Patient is satisfied with current therapy and denies issues -Recommended to continue current medication  Patient Goals/Self-Care Activities Patient will:  - take medications as prescribed check glucose daily, document, and provide at future appointments check blood pressure weekly, document, and provide at future appointments target a minimum of 150 minutes of moderate intensity exercise weekly  Follow Up Plan: Telephone  follow up appointment with care management team member scheduled for: 4 months      Mr. Verhoef was given information about Chronic Care Management services today including:  CCM service includes personalized support from designated clinical staff  supervised by his physician, including individualized plan of care and coordination with other care providers 24/7 contact phone numbers for assistance for urgent and routine care needs. Standard insurance, coinsurance, copays and deductibles apply for chronic care management only during months in which we provide at least 20 minutes of these services. Most insurances cover these services at 100%, however patients may be responsible for any copay, coinsurance and/or deductible if applicable. This service may help you avoid the need for more expensive face-to-face services. Only one practitioner may furnish and bill the service in a calendar month. The patient may stop CCM services at any time (effective at the end of the month) by phone call to the office staff.  Patient agreed to services and verbal consent obtained.   The patient verbalized understanding of instructions, educational materials, and care plan provided today and agreed to receive a mailed copy of patient instructions, educational materials, and care plan.  Telephone follow up appointment with pharmacy team member scheduled for: 4 months  Viona Gilmore, [facility]

## 2020-12-09 NOTE — Progress Notes (Signed)
Cardiology Office Note:    Date:  12/10/2020   ID:  [name], DOB 26-Jan-1932, MRN [id number]  PCP:  [name], [name], MD  Cardiologist:  Shirlee More, MD   Referring MD: [name], [name], MD  ASSESSMENT:    1. Coronary artery disease of native artery of native heart with stable angina pectoris ([place])   2. Hypertension with heart disease   3. Chronic heart failure with preserved ejection fraction ([place])   4. Sinus bradycardia   5. Hyperlipidemia, mixed   6. Type 2 diabetes mellitus with diabetic neuropathy, without long-term current use of insulin ([place])   7. Nonrheumatic aortic valve stenosis   8. Nonrheumatic aortic valve insufficiency    PLAN:    In order of problems listed above:  He has known CAD remote PCI's most recently 2010 although not listed on his meds he takes aspirin daily and is having a pattern of nonexertional anginal discomfort he attributes to fluid overload.  He may well be correct however he may be having symptoms becoming unstable from his CAD and have asked him to undergo myocardial perfusion study pharmacologically.  He is on a good medical regimen including low-dose aspirin he takes 81 mg daily his high intensity statin beta-blocker with heart rate at target on oral nitrates if he has demonstrable ischemia that is mild we can intensify treatment adding calcium channel blocker ranolazine if he has high risk markers consider percutaneous intervention.  In the interim of asked him to take his furosemide daily Continue current medical therapy including diuretic beta-blocker. Continue his high intensity statin with CAD diabetes managed by his PCP his aortic stenosis murmur is unremarkable however his last echocardiogram was 3664 certainly is at risk for progression we will also check echocardiogram at times physical examination could be unremarkable.  Next appointment 4 weeks   Medication Adjustments/Labs and Tests Ordered: Current medicines are reviewed at length  with the patient today.  Concerns regarding medicines are outlined above.  No orders of the defined types were placed in this encounter.  No orders of the defined types were placed in this encounter.    Chief Complaint  Patient presents with   Follow-up   Coronary Artery Disease  Having more frequent angina  History of Present Illness:    [name] is a 87 y.o. male who is being seen today for the evaluation of CAD at the request of [name], [name], MD. when seen in office follow-up with his PCP 09/17/2020 he was continuing to have episodes of angina.  Previously he was seen by Dr. Lilian Kapur 08/29/2015 with a history of type 2 diabetes hypertension with diastolic heart failure hyperlipidemia and CAD with PCI of the right coronary artery in 2004 subsequent non-ST elevation 2010 and PCI and bare-metal stent to the right coronary artery at that time. Echocardiogram 2015 showed mild concentric LVH normal systolic function no segmental left ventricular dysfunction normal left ventricular filling pressures he had mild aortic stenosis and moderate left atrial enlargement.  Subsequent echocardiogram 09/04/2015 showed normal left ventricular size systolic function elevated left ventricular filling pressure.  Aortic stenosis continued to be mild and there is mild valvular regurgitation.  Left atrium is moderately to severely dilated.  He has a history of peripheral edema as well as to take a diuretic daily and notices only does not take the diuretic he gets vague chest discomfort not typical exertional angina but he is relieved with the nitroglycerin when he uses it conversely he  can take the trash which is a long distance to the road and does not have chest pain.  He has an episode perhaps once a week the pattern stable its not occurring nocturnally awakening from sleep.  Although he has edema and takes a diuretic he does not have e orthopnea.  He has had no palpitation or syncope.  He thinks  he has early dementia his wife is present participates in evaluation decision making  Past Medical History:  Diagnosis Date   BACK PAIN, CHRONIC 04/18/2009   BENIGN PROSTATIC HYPERTROPHY, HX OF 01/19/2007   Carpal tunnel syndrome 03/24/2007   CORONARY ARTERY DISEASE 01/19/2007   DEPRESSION, CHRONIC 06/24/2007   DIABETES MELLITUS 01/19/2007   DIABETES MELLITUS, TYPE II, WITH NEUROLOGICAL COMPLICATIONS 05/16/4823   Diarrhea 02/23/2007   HEART MURMUR, SYSTOLIC 0/06/7046   HERPES SIMPLEX INFECTION 01/19/2007   HYPERLIPIDEMIA 01/19/2007   HYPERTENSION 01/19/2007   Intermediate coronary syndrome ([place]) 08/29/2008   LEG CRAMPS, NOCTURNAL 10/28/2007   Leg cramps, sleep related 08/05/2010   OBSTRUCTIVE SLEEP APNEA 01/19/2007   does not use CPAP   OSTEOARTHRITIS 06/24/2007   Other postprocedural status(V45.89) 01/19/2007   PERCUTANEOUS TRANSLUMINAL CORONARY ANGIOPLASTY, HX OF 01/19/2007   PLANTAR FASCIITIS, RIGHT 01/19/2007   SHOULDER PAIN, LEFT 03/24/2007   SPINAL STENOSIS, LUMBAR 06/03/2009   TINEA PEDIS 11/15/2009   URI 01/11/2009    Past Surgical History:  Procedure Laterality Date   arthroscopy, knee of hx     CARDIAC CATHETERIZATION     CATARACT EXTRACTION Left    COLONOSCOPY     inguinal herniorrhapy, hx     NASAL SINUS SURGERY     percutaneous transluminal coronary angioplasty, hx of  2004   Dr. Lyndel Safe   plastic joint in thumb     PTCA/stent  2004   TONSILLECTOMY     VASECTOMY      Current Medications: Current Meds  Medication Sig   acetaminophen (TYLENOL) 500 MG tablet Take 500 mg by mouth every 6 (six) hours as needed.   albuterol (VENTOLIN HFA) 108 (90 Base) MCG/ACT inhaler Inhale 2 puffs into the lungs every 6 (six) hours as needed. (Patient taking differently: Inhale 2 puffs into the lungs every 6 (six) hours as needed for wheezing or shortness of breath.)   Blood Glucose Monitoring Suppl (ACCU-CHEK AVIVA CONNECT) w/Device KIT 1 Device by Does not apply route daily.   Cyanocobalamin  (B-12) 1000 MCG TABS Take 1,000 mcg by mouth daily.   finasteride (PROSCAR) 5 MG tablet TAKE 1 TABLET BY MOUTH EVERY DAY   furosemide (LASIX) 40 MG tablet Take 1 tablet (40 mg total) by mouth 2 (two) times daily. (Patient taking differently: Take 40 mg by mouth daily.)   glipiZIDE (GLUCOTROL) 5 MG tablet Take 1 tablet (5 mg total) by mouth daily before breakfast. 20-30 minutes before meal.   isosorbide mononitrate (IMDUR) 30 MG 24 hr tablet TAKE 1 TABLET BY MOUTH EVERY DAY   Lancet Devices (LANCET DEVICE WITH EJECTOR) MISC 1 Device by Does not apply route daily.   loperamide (IMODIUM A-D) 2 MG tablet Take 2 mg by mouth 4 (four) times daily as needed for diarrhea or loose stools.   metoprolol tartrate (LOPRESSOR) 25 MG tablet TAKE 1 TABLET BY MOUTH EVERY DAY   nitroGLYCERIN (NITROSTAT) 0.4 MG SL tablet Place 1 tablet (0.4 mg total) under the tongue every 5 (five) minutes as needed for chest pain.   OneTouch Delica Lancets 88B MISC USE TO CHECK BLOOD SUGAR DAILY AND PRN  ONETOUCH ULTRA test strip USE TO CHECK BLOOD SUGAR DAILY E11.9   rOPINIRole (REQUIP) 0.5 MG tablet TAKE 1 TABLET BY MOUTH AT BEDTIME.   rosuvastatin (CRESTOR) 10 MG tablet TAKE 1 TABLET BY MOUTH EVERY DAY   tamsulosin (FLOMAX) 0.4 MG CAPS capsule TAKE 1 CAPSULE BY MOUTH EVERY DAY AFTER SUPPER   tizanidine (ZANAFLEX) 2 MG capsule Take 4 capsules (8 mg total) by mouth at bedtime as needed for muscle spasms.   Current Facility-Administered Medications for the 12/10/20 encounter (Office Visit) with [name], MD  Medication   triamcinolone acetonide (KENALOG-40) injection 40 mg     Allergies:   Patient has no known allergies.   Social History   Socioeconomic History   Marital status: Married    Spouse name: Not on file   Number of children: Not on file   Years of education: Not on file   Highest education level: Not on file  Occupational History   Occupation: retired    Comment: Truck Research officer, political party  Tobacco Use    Smoking status: Former    Types: Cigarettes    Quit date: 04/14/1983    Years since quitting: 37.6   Smokeless tobacco: Never   Tobacco comments:    stopped 1985  Vaping Use   Vaping Use: Never used  Substance and Sexual Activity   Alcohol use: Yes    Comment: OCCASSIONAL   Drug use: Not Currently    Types: Marijuana    Comment: occ   Sexual activity: Not on file  Other Topics Concern   Not on file  Social History Narrative   Married in 1959 - 46yr, divorced; married 1974 - 1.5 years, divorced; married 1985 1 daughter 116 [id number] daughters; 2 grandchildren.   Social Determinants of Health   Financial Resource Strain: Medium Risk   Difficulty of Paying Living Expenses: Somewhat hard  Food Insecurity: Not on file  Transportation Needs: No Transportation Needs   Lack of Transportation (Medical): No   Lack of Transportation (Non-Medical): No  Physical Activity: Not on file  Stress: Not on file  Social Connections: Not on file     Family History: The patient's family history includes Arthritis in his sister; Coronary artery disease in an other family member; Diabetes in an other family member.  ROS:   ROS Please see the history of present illness.     All other systems reviewed and are negative.  EKGs/Labs/Other Studies Reviewed:    The following studies were reviewed today:   EKG:  EKG is sinus bradycardia 57 bpm flattened T waves but really abnormal EKGRecent Labs: 04/26/2020: ALT 15 08/13/2020: BUN 20; Creatinine, Ser 0.67; Hemoglobin 14.5; Platelets 158.0; Potassium 4.2; Sodium 140; TSH 2.36  Recent Lipid Panel    Component Value Date/Time   CHOL 242 (H) 04/26/2020 1308   TRIG 281.0 (H) 04/26/2020 1308   TRIG 162 (H) 03/24/2006 0902   HDL 47.40 04/26/2020 1308   CHOLHDL 5 04/26/2020 1308   VLDL 56.2 (H) 04/26/2020 1308   LDLCALC 124 (H) 01/30/2019 1142   LDLDIRECT 121.0 04/26/2020 1308    Physical Exam:    VS:  BP (!) 152/81 (BP Location: Right Arm,  Patient Position: Sitting, Cuff Size: Normal)   Pulse (!) 57   Ht 5' 3"  (1.6 m)   Wt 157 lb (71.2 kg)   SpO2 95%   BMI 27.81 kg/m     Wt Readings from Last 3 Encounters:  12/10/20 157 lb (71.2 kg)  09/17/20  159 lb (72.1 kg)  08/13/20 160 lb 9.6 oz (72.8 kg)     GEN: He appears his age well nourished, well developed in no acute distress HEENT: Normal NECK: No JVD; No carotid bruits LYMPHATICS: No lymphadenopathy CARDIAC: Grade 1/6 midsystolic ejection murmur localized aortic area S2 is normal no AR RRR, no  rubs, gallops RESPIRATORY:  Clear to auscultation without rales, wheezing or rhonchi  ABDOMEN: Soft, non-tender, non-distended MUSCULOSKELETAL:  No edema; No deformity  SKIN: Warm and dry NEUROLOGIC:  Alert and oriented x 3 PSYCHIATRIC:  Normal affect     Signed, Shirlee More, MD  12/10/2020 3:28 PM    [facility]

## 2020-12-10 ENCOUNTER — Encounter: Payer: Self-pay | Admitting: Cardiology

## 2020-12-10 ENCOUNTER — Ambulatory Visit: Payer: Medicare HMO | Admitting: Cardiology

## 2020-12-10 ENCOUNTER — Other Ambulatory Visit: Payer: Self-pay

## 2020-12-10 VITALS — BP 152/81 | HR 57 | Ht 63.0 in | Wt 157.0 lb

## 2020-12-10 DIAGNOSIS — E782 Mixed hyperlipidemia: Secondary | ICD-10-CM | POA: Diagnosis not present

## 2020-12-10 DIAGNOSIS — I350 Nonrheumatic aortic (valve) stenosis: Secondary | ICD-10-CM

## 2020-12-10 DIAGNOSIS — I351 Nonrheumatic aortic (valve) insufficiency: Secondary | ICD-10-CM

## 2020-12-10 DIAGNOSIS — I5032 Chronic diastolic (congestive) heart failure: Secondary | ICD-10-CM | POA: Diagnosis not present

## 2020-12-10 DIAGNOSIS — E1140 Type 2 diabetes mellitus with diabetic neuropathy, unspecified: Secondary | ICD-10-CM | POA: Diagnosis not present

## 2020-12-10 DIAGNOSIS — I119 Hypertensive heart disease without heart failure: Secondary | ICD-10-CM

## 2020-12-10 DIAGNOSIS — I25118 Atherosclerotic heart disease of native coronary artery with other forms of angina pectoris: Secondary | ICD-10-CM

## 2020-12-10 DIAGNOSIS — R001 Bradycardia, unspecified: Secondary | ICD-10-CM | POA: Diagnosis not present

## 2020-12-10 NOTE — Patient Instructions (Addendum)
Medication Instructions:  Your physician recommends that you continue on your current medications as directed. Please refer to the Current Medication list given to you today.  *If you need a refill on your cardiac medications before your next appointment, please call your pharmacy*   Lab Work: None If you have labs (blood work) drawn today and your tests are completely normal, you will receive your results only by: [place] (if you have MyChart) OR A paper copy in the mail If you have any lab test that is abnormal or we need to change your treatment, we will call you to review the results.   Testing/Procedures:   [facility] at [facility] [address] [place], [place] [address] Phone: [phone]    Please arrive 15 minutes prior to your appointment time for registration and insurance purposes.  The test will take approximately 3 to 4 hours to complete; you may bring reading material.  If someone comes with you to your appointment, they will need to remain in the main lobby due to limited space in the testing area. **If you are pregnant or breastfeeding, please notify the nuclear lab prior to your appointment**  How to prepare for your Myocardial Perfusion Test: Do not eat or drink 3 hours prior to your test, except you may have water. Do not consume products containing caffeine (regular or decaffeinated) 12 hours prior to your test. (ex: coffee, chocolate, sodas, tea). Do bring a list of your current medications with you.  If not listed below, you may take your medications as normal. Do wear comfortable clothes (no dresses or overalls) and walking shoes, tennis shoes preferred (No heels or open toe shoes are allowed). Do NOT wear cologne, perfume, aftershave, or lotions (deodorant is allowed). If these instructions are not followed, your test will have to be rescheduled.  Please report to [address] for your  test.  If you have questions or concerns about your appointment, you can call the Nuclear Lab at [phone].  If you cannot keep your appointment, please provide 24 hours notification to the Nuclear Lab, to avoid a possible $50 charge to your account.  Your physician has requested that you have an echocardiogram. Echocardiography is a painless test that uses sound waves to create images of your heart. It provides your doctor with information about the size and shape of your heart and how well your heart's chambers and valves are working. This procedure takes approximately one hour. There are no restrictions for this procedure.     Follow-Up: At [facility], you and your health needs are our priority.  As part of our continuing mission to provide you with exceptional heart care, we have created designated Provider Care Teams.  These Care Teams include your primary Cardiologist (physician) and Advanced Practice Providers (APPs -  Physician Assistants and Nurse Practitioners) who all work together to provide you with the care you need, when you need it.  We recommend signing up for the patient portal called "MyChart".  Sign up information is provided on this After Visit Summary.  MyChart is used to connect with patients for Virtual Visits (Telemedicine).  Patients are able to view lab/test results, encounter notes, upcoming appointments, etc.  Non-urgent messages can be sent to your provider as well.   To learn more about what you can do with MyChart, go to [email or web address].    Your next appointment:   6 week(s)  The format for your next  appointment:   In Person  Provider:   Shirlee More, MD   Other Instructions

## 2020-12-12 ENCOUNTER — Telehealth: Payer: Self-pay | Admitting: Cardiology

## 2020-12-12 NOTE — Telephone Encounter (Signed)
Spoke to the patient just now. He let me know that someone called him this morning to schedule his stress test. He tells me "I am not going to have this test completed". I told him if he changes his mind or would like to speak more about the test to let us know.    Encouraged patient to call back with any questions or concerns.

## 2020-12-12 NOTE — Telephone Encounter (Signed)
Patient was returning call 

## 2020-12-17 ENCOUNTER — Telehealth: Payer: Self-pay | Admitting: Pharmacist

## 2020-12-17 NOTE — Progress Notes (Signed)
Chronic Care Management Pharmacy Gamble   Name: [name]  MRN: [id number] DOB: 07-24-1931  Reason for Encounter: Medication Tolerance Assessment and Pap discussion per [name]    Recent office visits:  None  Recent consult visits:  12-10-2020 Andrew Priest, MD (Cardiology) - Patient presented for Coronary artery disease of native artery of native heart with stable angina pectoris and other concerns. Stopped Jardiance 10 mg per pt not taking.   Hospital visits:  None in previous 6 months  Medications: Outpatient Encounter Medications as of 12/17/2020  Medication Sig   acetaminophen (TYLENOL) 500 MG tablet Take 500 mg by mouth every 6 (six) hours as needed.   albuterol (VENTOLIN HFA) 108 (90 Base) MCG/ACT inhaler Inhale 2 puffs into the lungs every 6 (six) hours as needed. (Patient taking differently: Inhale 2 puffs into the lungs every 6 (six) hours as needed for wheezing or shortness of breath.)   Blood Glucose Monitoring Suppl (ACCU-CHEK AVIVA CONNECT) w/Device KIT 1 Device by Does not apply route daily.   Cyanocobalamin (B-12) 1000 MCG TABS Take 1,000 mcg by mouth daily.   finasteride (PROSCAR) 5 MG tablet TAKE 1 TABLET BY MOUTH EVERY DAY   furosemide (LASIX) 40 MG tablet Take 1 tablet (40 mg total) by mouth 2 (two) times daily. (Patient taking differently: Take 40 mg by mouth daily.)   glipiZIDE (GLUCOTROL) 5 MG tablet Take 1 tablet (5 mg total) by mouth daily before breakfast. 20-30 minutes before meal.   isosorbide mononitrate (IMDUR) 30 MG 24 hr tablet TAKE 1 TABLET BY MOUTH EVERY DAY   Lancet Devices (LANCET DEVICE WITH EJECTOR) MISC 1 Device by Does not apply route daily.   loperamide (IMODIUM A-D) 2 MG tablet Take 2 mg by mouth 4 (four) times daily as needed for diarrhea or loose stools.   metoprolol tartrate (LOPRESSOR) 25 MG tablet TAKE 1 TABLET BY MOUTH EVERY DAY   nitroGLYCERIN (NITROSTAT) 0.4 MG SL tablet Place 1 tablet (0.4 mg total) under the tongue every 5  (five) minutes as needed for chest pain.   OneTouch Delica Lancets 96P MISC USE TO CHECK BLOOD SUGAR DAILY AND PRN   ONETOUCH ULTRA test strip USE TO CHECK BLOOD SUGAR DAILY E11.9   rOPINIRole (REQUIP) 0.5 MG tablet TAKE 1 TABLET BY MOUTH AT BEDTIME.   rosuvastatin (CRESTOR) 10 MG tablet TAKE 1 TABLET BY MOUTH EVERY DAY   tamsulosin (FLOMAX) 0.4 MG CAPS capsule TAKE 1 CAPSULE BY MOUTH EVERY DAY AFTER SUPPER   tizanidine (ZANAFLEX) 2 MG capsule Take 4 capsules (8 mg total) by mouth at bedtime as needed for muscle spasms.   Facility-Administered Encounter Medications as of 12/17/2020  Medication   triamcinolone acetonide (KENALOG-40) injection 40 mg  Notes: Per [name] call to patient to see how he is tolerating the Rosuvastatin once daily. Patient reports he has been taking once a day and has been experiencing cramping, he reports it is during the day at times but it also occurs at night and is worse and causes him difficulty when trying to sleep. He reports his arms and legs have the cramping and they get tight and stiff and very uncomfortable. He reported he would like to go ahead and proceed with the patient assistance application for the Jardiance, advised I would send him the application with instructions for his portion to complete and he is then to return to Dr [name] office to be sent off to the manufacturer. He was in agreement.  PAP for  Jardiance completed to be mailed to patient  Care Gaps: AWV - scheduled for 12-18-2020 Zoster Vaccine - Overdue Foot Exam- Overdue Eye Exam - Overdue Flu Vaccine - Overdue CCM F/U - 03-13-21  Star Rating Drugs: Rosuvastatin 26m - Last filled 10-21-2020 [facility] Glipizide 541m- Last filled 10-10-2020 [facility] at [facility] Empafliflozin 1072m Patient has not filled due to price has been taking Glipizide per notes with PCP in chart.   [name] [phone]

## 2020-12-18 ENCOUNTER — Ambulatory Visit: Payer: Medicare HMO

## 2020-12-20 NOTE — Progress Notes (Signed)
Chief Complaint  Patient presents with   Follow-up   [name] is a 87 y.o. male, who is here today with his wife for chronic disease management. He was last seen on 09/17/20. Since his last visit he has seen cardiologist. He is still having episodes of chest pain radiated to LUE and jaw but not as frequent or intense as they were. Last episode 3 days ago. It is not associated with exertion. Associated diaphoresis. No palpitations or SOB.  Stress test and echo were recommended, he has not yet decided if he wants to pursue further work up. He is not interested in extreme/invasive treatments.  He is on Imdur 30 mg daily. Metoprolol Tartrate is being weaned off because bradycardia, he is on 12.5 mg daily. LE edema is stable. He feels better when he is taking Furosemide 20 mg bid. Negative for orthopnea and PND.  Echo in 08/2017: - Left ventricle: The cavity size was normal. Wall thickness was increased in a pattern of moderate LVH. Systolic function was    normal. The estimated ejection fraction was in the range of 60% to 65%. Wall motion was normal; there were no regional wall motion abnormalities. Features are consistent with a pseudonormal left ventricular filling pattern, with concomitant abnormal relaxation and increased filling pressure (grade 2 diastolic dysfunction).  - Aortic valve: There was mild regurgitation.  - Mitral valve: Mildly calcified annulus. There was mild    regurgitation.  - Left atrium: The atrium was mildly dilated.  - Right atrium: The atrium was mildly dilated.  - Pulmonary arteries: Systolic pressure was mildly increased. PA  peak pressure: 38 mm Hg (S).   Hx of OSA, he did not tolerate CPAP.  Lab Results  Component Value Date   CREATININE 0.67 08/13/2020   BUN 20 08/13/2020   NA 140 08/13/2020   K 4.2 08/13/2020   CL 104 08/13/2020   CO2 28 08/13/2020   -A week ago sudden onset of left hand edema and severe pain. There was some erythema.  No hx of trauma. + Limitation of ROM. No hx of gout. Gradually improved after 3 days. Right handed.  -Chronic diarrhea: Problem has been intermittent for years. He has not identified exacerbating or alleviating factors. It was better for sometimes but recently he has had some "surprising" episodes,unexpected.  He takes OTC Imodium prn, he wonders if he needs to take it daily.  No associated abdominal pain,nausea,vomiting,or blood in stool.  He has noted wt loss, he does not feel hungry during the day, so eats mainly at night. His wife reports that he loves hotdog, he eats 2-3 in one day. He eats around 2 Am.  -Sleeping about 5 hours. Hx of insomnia. Pharmacologic treatments have not helped in the past. Sleep affected by nocturia. Hx of BPH, he has been evaluated by urologist, symptoms have not been well controlled. He is on Flomax 0.4 mg, wonders if can take more than once daily. He is also on Proscar 5 mg daily.  Doxazosin did not help. Negative for dysuria,gross hematuria,or decreased urine output.  -Diabetes Mellitus II:  - Checking BG at home: 130's. Some post prandials 200-300's., 300's after eating ice cream. - Medications: Glipizide 5 mg daily, about q 3 weeks he takes an extra one,usually when BS's are higher than his usual. - Diet: Trying to do better. - Exercise: Not able to do so because chronic pain (OA). - eye exam: 06/2019. - foot exam: 08/2020. - Negative for symptoms of  hypoglycemia, polyuria, polydipsia, foot ulcers/trauma + Peripheral neuropathy.  HgA1C was 7.5 on 08/13/20.  Lab Results  Component Value Date   MICROALBUR 5.3 (H) 04/26/2020   -HLD He was taking Crestor 10 mg until 5 days ago, when he discontinued. He met with pharmacist and was told statin med can cause cramps. He has had cramps for years,interfere with sleep. He follows with neurologist, has tried different medications unsuccessfully. Cramps seemed to improved sine he last took  medication. He has not tolerated other statins in the past.  Lab Results  Component Value Date   CHOL 242 (H) 04/26/2020   HDL 47.40 04/26/2020   LDLCALC 124 (H) 01/30/2019   LDLDIRECT 121.0 04/26/2020   TRIG 281.0 (H) 04/26/2020   CHOLHDL 5 04/26/2020   Review of Systems  Constitutional:  Positive for fatigue. Negative for activity change and fever.  HENT:  Negative for mouth sores, nosebleeds and sore throat.   Eyes:  Negative for redness and visual disturbance.  Respiratory:  Negative for cough and wheezing.   Genitourinary:  Negative for decreased urine volume, dysuria and hematuria.  Musculoskeletal:  Positive for arthralgias, back pain and gait problem.  Neurological:  Negative for syncope, weakness and headaches.  Psychiatric/Behavioral:  Positive for sleep disturbance. Negative for confusion.   Rest of ROS see pertinent positives and negatives in HPI.  Current Outpatient Medications on File Prior to Visit  Medication Sig Dispense Refill   acetaminophen (TYLENOL) 500 MG tablet Take 500 mg by mouth every 6 (six) hours as needed.     albuterol (VENTOLIN HFA) 108 (90 Base) MCG/ACT inhaler Inhale 2 puffs into the lungs every 6 (six) hours as needed. (Patient taking differently: Inhale 2 puffs into the lungs every 6 (six) hours as needed for wheezing or shortness of breath.) 18 g 3   Blood Glucose Monitoring Suppl (ACCU-CHEK AVIVA CONNECT) w/Device KIT 1 Device by Does not apply route daily. 1 kit 0   Cyanocobalamin (B-12) 1000 MCG TABS Take 1,000 mcg by mouth daily.     finasteride (PROSCAR) 5 MG tablet TAKE 1 TABLET BY MOUTH EVERY DAY 90 tablet 2   furosemide (LASIX) 40 MG tablet Take 1 tablet (40 mg total) by mouth 2 (two) times daily. (Patient taking differently: Take 40 mg by mouth daily.) 180 tablet 1   glipiZIDE (GLUCOTROL) 5 MG tablet Take 1 tablet (5 mg total) by mouth daily before breakfast. 20-30 minutes before meal. 90 tablet 1   isosorbide mononitrate (IMDUR) 30 MG 24  hr tablet TAKE 1 TABLET BY MOUTH EVERY DAY 90 tablet 2   Lancet Devices (LANCET DEVICE WITH EJECTOR) MISC 1 Device by Does not apply route daily. 1 each 1   loperamide (IMODIUM A-D) 2 MG tablet Take 2 mg by mouth 4 (four) times daily as needed for diarrhea or loose stools.     metoprolol tartrate (LOPRESSOR) 25 MG tablet TAKE 1 TABLET BY MOUTH EVERY DAY 90 tablet 2   nitroGLYCERIN (NITROSTAT) 0.4 MG SL tablet Place 1 tablet (0.4 mg total) under the tongue every 5 (five) minutes as needed for chest pain. 50 tablet 1   OneTouch Delica Lancets 64P MISC USE TO CHECK BLOOD SUGAR DAILY AND PRN 100 each 4   ONETOUCH ULTRA test strip USE TO CHECK BLOOD SUGAR DAILY E11.9 100 strip 4   rOPINIRole (REQUIP) 0.5 MG tablet TAKE 1 TABLET BY MOUTH AT BEDTIME. 90 tablet 1   rosuvastatin (CRESTOR) 10 MG tablet TAKE 1 TABLET BY MOUTH EVERY DAY  90 tablet 1   tamsulosin (FLOMAX) 0.4 MG CAPS capsule TAKE 1 CAPSULE BY MOUTH EVERY DAY AFTER SUPPER 90 capsule 2   tizanidine (ZANAFLEX) 2 MG capsule Take 4 capsules (8 mg total) by mouth at bedtime as needed for muscle spasms. 360 capsule 1   Current Facility-Administered Medications on File Prior to Visit  Medication Dose Route Frequency Provider Last Rate Last Admin   triamcinolone acetonide (KENALOG-40) injection 40 mg  40 mg Intramuscular Once [name], Lateefa Crosby G, MD        Past Medical History:  Diagnosis Date   BACK PAIN, CHRONIC 04/18/2009   BENIGN PROSTATIC HYPERTROPHY, HX OF 01/19/2007   Carpal tunnel syndrome 03/24/2007   CORONARY ARTERY DISEASE 01/19/2007   DEPRESSION, CHRONIC 06/24/2007   DIABETES MELLITUS 01/19/2007   DIABETES MELLITUS, TYPE II, WITH NEUROLOGICAL COMPLICATIONS 05/17/4626   Diarrhea 02/23/2007   HEART MURMUR, SYSTOLIC 6/38/1771   HERPES SIMPLEX INFECTION 01/19/2007   HYPERLIPIDEMIA 01/19/2007   HYPERTENSION 01/19/2007   Intermediate coronary syndrome ([place]) 08/29/2008   LEG CRAMPS, NOCTURNAL 10/28/2007   Leg cramps, sleep related 08/05/2010    OBSTRUCTIVE SLEEP APNEA 01/19/2007   does not use CPAP   OSTEOARTHRITIS 06/24/2007   Other postprocedural status(V45.89) 01/19/2007   PERCUTANEOUS TRANSLUMINAL CORONARY ANGIOPLASTY, HX OF 01/19/2007   PLANTAR FASCIITIS, RIGHT 01/19/2007   SHOULDER PAIN, LEFT 03/24/2007   SPINAL STENOSIS, LUMBAR 06/03/2009   TINEA PEDIS 11/15/2009   URI 01/11/2009    No Known Allergies  Social History   Socioeconomic History   Marital status: Married    Spouse name: Not on file   Number of children: Not on file   Years of education: Not on file   Highest education level: Not on file  Occupational History   Occupation: retired    Comment: Truck Research officer, political party  Tobacco Use   Smoking status: Former    Types: Cigarettes    Quit date: 04/14/1983    Years since quitting: 37.7   Smokeless tobacco: Never   Tobacco comments:    stopped 1985  Vaping Use   Vaping Use: Never used  Substance and Sexual Activity   Alcohol use: Yes    Comment: OCCASSIONAL   Drug use: Not Currently    Types: Marijuana    Comment: occ   Sexual activity: Not on file  Other Topics Concern   Not on file  Social History Narrative   Married in [place] - 85yr, divorced; married 1974 - 1.5 years, divorced; married 1985 1 daughter 190 [id number] daughters; 2 grandchildren.   Social Determinants of Health   Financial Resource Strain: Medium Risk   Difficulty of Paying Living Expenses: Somewhat hard  Food Insecurity: Not on file  Transportation Needs: No Transportation Needs   Lack of Transportation (Medical): No   Lack of Transportation (Non-Medical): No  Physical Activity: Not on file  Stress: Not on file  Social Connections: Not on file    Today's Vitals   12/23/20 1457  BP: 140/80  Pulse: 73  Resp: 16  SpO2: 97%  Weight: 159 lb (72.1 kg)  Height: 5' 3"  (1.6 m)   Wt Readings from Last 3 Encounters:  12/23/20 159 lb (72.1 kg)  12/10/20 157 lb (71.2 kg)  09/17/20 159 lb (72.1 kg)   Body mass index is 28.17  kg/m.  Physical Exam Nursing note reviewed.  Constitutional:      General: He is not in acute distress.    Appearance: He is well-developed.  HENT:  Head: Normocephalic and atraumatic.     Mouth/Throat:     Mouth: Mucous membranes are moist.     Pharynx: Oropharynx is clear.  Eyes:     Conjunctiva/sclera: Conjunctivae normal.  Cardiovascular:     Rate and Rhythm: Normal rate and regular rhythm.     Pulses:          Dorsalis pedis pulses are 2+ on the right side and 2+ on the left side.     Heart sounds: Murmur (Dyastolic I-II/VI RUSB, SEM I/VI LUSB) heard.  Pulmonary:     Effort: Pulmonary effort is normal. No respiratory distress.     Breath sounds: Normal breath sounds.  Abdominal:     Palpations: Abdomen is soft.     Tenderness: There is no abdominal tenderness.  Musculoskeletal:     Right lower leg: 1+ Pitting Edema present.     Left lower leg: 1+ Pitting Edema present.  Lymphadenopathy:     Cervical: No cervical adenopathy.  Skin:    General: Skin is warm.     Findings: No erythema or rash.  Neurological:     Mental Status: He is alert and oriented to person, place, and time.     Cranial Nerves: No cranial nerve deficit.     Gait: Gait normal.  Psychiatric:     Comments: Well groomed, good eye contact.   ASSESSMENT AND PLAN:  Mr.Currie was seen today for follow-up.  Diagnoses and all orders for this visit:  Orders Placed This Encounter  Procedures   Basic metabolic panel   Lipid panel   Fructosamine   POC HgB A1c   Lab Results  Component Value Date   HGBA1C 7.0 (A) 12/23/2020   Lab Results  Component Value Date   CHOL 168 12/23/2020   HDL 48.60 12/23/2020   LDLCALC 93 12/23/2020   LDLDIRECT 121.0 04/26/2020   TRIG 132.0 12/23/2020   CHOLHDL 3 12/23/2020   Lab Results  Component Value Date   CREATININE 0.65 12/23/2020   BUN 16 12/23/2020   NA 140 12/23/2020   K 3.9 12/23/2020   CL 105 12/23/2020   CO2 26 12/23/2020   Type 2 diabetes  mellitus with diabetic neuropathy, without long-term current use of insulin ([place]) Problem adequately controlled. His health insurance has not covered other pharmacologic treatment,including Jardiance and Januvia.Metformin aggravated diarrhea. So continue Glipizide 5 mg daily. Caution with hypoglycemic events.  Annual eye exam and appropriate foot care to continue. F/U in 5-6 months  Hyperlipidemia, mixed We discussed CV benefits of statins as well as some side effects. LDL 124 in 04/2020, goal < 70. He can try Crestor again and monitor for changes in cramps, can try a few days per week.If not well tolerated a PCSK9 inhibitor will be a good option for him.  Chronic heart failure with preserved ejection fraction (HCC) We discussed prior echo findings. Continue Furosemide 20 mg bid and Metoprolol Tartrate 12.5 mg daily. Low salt diet. Recommend having echo done, his cardiologist have placed order.  Cramp of limb Chronic. He feels like it improved after d/c of statin. Following with neurologist. Continue Zanaflex 8 mg at bedtime prn.  Hypertension with heart disease BP is adequately controlled,home BP"s are better.  Continue Imdur 30 mg daily and Metoprolol Tartrate 12.5 mg daily. Low salt diet. Continue monitoring BP at home.  Chronic diarrhea He can taking Imodium OTC 1 tab daily and a 2nd one if needed. Adequate hydration. Instructed about warning signs.  Atherosclerosis of native coronary  artery of native heart with unstable angina pectoris (HCC) Still symptomatic. He has not decided yet about having stress test, advised to follow cardio recommendations. Continue Imdur and Metoprolol tartrate. Will consider increasing Imdur from 30 mg to 60 mg, will wait for lab results. He is willing to try Crestor again.  Continue Aspirin 81 mg daily. Instructed about warning signs. Following with cardiologist.  I spent a total of 45 minutes in both face to face and non face to face  activities for this visit on the date of this encounter. During this time history was obtained and documented, examination was performed, prior labs/imaging reviewed, and assessment/plan discussed.  Return in about 4 months (around 04/24/2021).   Tigerlily Christine [name], MD [facility]. [place] office.

## 2020-12-23 ENCOUNTER — Encounter: Payer: Self-pay | Admitting: Family Medicine

## 2020-12-23 ENCOUNTER — Ambulatory Visit (INDEPENDENT_AMBULATORY_CARE_PROVIDER_SITE_OTHER): Payer: Medicare HMO | Admitting: Family Medicine

## 2020-12-23 VITALS — BP 140/80 | HR 73 | Resp 16 | Ht 63.0 in | Wt 159.0 lb

## 2020-12-23 DIAGNOSIS — K529 Noninfective gastroenteritis and colitis, unspecified: Secondary | ICD-10-CM

## 2020-12-23 DIAGNOSIS — I5032 Chronic diastolic (congestive) heart failure: Secondary | ICD-10-CM | POA: Diagnosis not present

## 2020-12-23 DIAGNOSIS — I25110 Atherosclerotic heart disease of native coronary artery with unstable angina pectoris: Secondary | ICD-10-CM | POA: Diagnosis not present

## 2020-12-23 DIAGNOSIS — E1140 Type 2 diabetes mellitus with diabetic neuropathy, unspecified: Secondary | ICD-10-CM

## 2020-12-23 DIAGNOSIS — I119 Hypertensive heart disease without heart failure: Secondary | ICD-10-CM | POA: Diagnosis not present

## 2020-12-23 DIAGNOSIS — R079 Chest pain, unspecified: Secondary | ICD-10-CM

## 2020-12-23 DIAGNOSIS — R252 Cramp and spasm: Secondary | ICD-10-CM | POA: Diagnosis not present

## 2020-12-23 DIAGNOSIS — E782 Mixed hyperlipidemia: Secondary | ICD-10-CM | POA: Diagnosis not present

## 2020-12-23 LAB — POCT GLYCOSYLATED HEMOGLOBIN (HGB A1C): Hemoglobin A1C: 7 % — AB (ref 4.0–5.6)

## 2020-12-23 NOTE — Patient Instructions (Addendum)
A few things to remember from today's visit:  Type 2 diabetes mellitus with diabetic neuropathy, without long-term current use of insulin ([place]) - Plan: POC HgB [id number], Basic metabolic panel, Fructosamine  Hyperlipidemia, mixed - Plan: Lipid panel  Chronic heart failure with preserved ejection fraction (HCC)  Chest pain, unspecified type  Cramp of limb  No changes today. Please reconsider having test your cardiologist recommended.  If you need refills please call your pharmacy. Do not use My Chart to request refills or for acute issues that need immediate attention.   Please be sure medication list is accurate. If a new problem present, please set up appointment sooner than planned today.

## 2020-12-24 LAB — LIPID PANEL
Cholesterol: 168 mg/dL (ref 0–200)
HDL: 48.6 mg/dL (ref >39.00)
LDL Cholesterol: 93 mg/dL (ref 0–99)
NonHDL: 119.8
Total CHOL/HDL Ratio: 3
Triglycerides: 132 mg/dL (ref 0.0–149.0)
VLDL: 26.4 mg/dL (ref 0.0–40.0)

## 2020-12-24 LAB — BASIC METABOLIC PANEL
BUN: 16 mg/dL (ref 6–23)
CO2: 26 mEq/L (ref 19–32)
Calcium: 9.2 mg/dL (ref 8.4–10.5)
Chloride: 105 mEq/L (ref 96–112)
Creatinine, Ser: 0.65 mg/dL (ref 0.40–1.50)
GFR: 85.12 mL/min (ref >60.00)
Glucose, Bld: 149 mg/dL — ABNORMAL HIGH (ref 70–99)
Potassium: 3.9 mEq/L (ref 3.5–5.1)
Sodium: 140 mEq/L (ref 135–145)

## 2020-12-26 LAB — FRUCTOSAMINE: Fructosamine: 276 umol/L (ref 205–285)

## 2020-12-26 MED ORDER — ISOSORBIDE MONONITRATE ER 60 MG PO TB24: 60.0000 mg | ORAL_TABLET | Freq: Every day | ORAL | 1 refills | Status: DC

## 2020-12-26 NOTE — Progress Notes (Signed)
    Chronic Care Management Pharmacy Assistant  12-26-2020 Received forwarded message from patient left at office in regards to his Jardiance application. Patient notes he did receive paperwork and does not think that with his income and assets he will qualify, Patient was sent paperwork on last touch point from myself as he was in agreement in proceeding with application. Will update records for [facility] that patient is no longer interested. Pharmacist made aware also      [name] Pharmacist Assistant [phone]

## 2020-12-30 ENCOUNTER — Telehealth: Payer: Self-pay | Admitting: Family Medicine

## 2020-12-30 NOTE — Telephone Encounter (Signed)
Left message for patient to call back and schedule Medicare Annual Wellness Visit (AWV) either virtually or in office. Left  my Herbie Drape number [phone]   Last AWV ;12/06/19  please schedule at anytime with [facility] Nurse Health Advisor 1 or 2   This should be a 45 minute visit.

## 2021-01-07 NOTE — Progress Notes (Signed)
    Chronic Care Management Pharmacy Assistant   Name: [name]  MRN: [id number] DOB: 1931-10-05  Reason for Encounter: Reschedule Appointment with Jeni Salles as she will be at a different office on Thursdays per Thurmond Butts PTM.  Call to patient advised of the above, canceled appointment from 03-13-2021 and rescheduled for 03-28-2021 patient in agreement.  [place] Clinical Pharmacist Assistant [phone]

## 2021-01-14 ENCOUNTER — Ambulatory Visit: Payer: Medicare HMO

## 2021-01-14 ENCOUNTER — Telehealth: Payer: Self-pay

## 2021-01-14 NOTE — Telephone Encounter (Signed)
Called patient x 3 with no answer.  Patient may reschedule next available appointment.  L.Chapman Matteucci,LPN

## 2021-01-15 ENCOUNTER — Other Ambulatory Visit: Payer: Self-pay | Admitting: Family Medicine

## 2021-01-27 ENCOUNTER — Ambulatory Visit (INDEPENDENT_AMBULATORY_CARE_PROVIDER_SITE_OTHER): Payer: Medicare HMO

## 2021-01-27 DIAGNOSIS — Z0000 Encounter for general adult medical examination without abnormal findings: Secondary | ICD-10-CM

## 2021-01-27 NOTE — Patient Instructions (Signed)
[name] , Thank you for taking time to come for your Medicare Wellness Visit. I appreciate your ongoing commitment to your health goals. Please review the following plan we discussed and let me know if I can assist you in the future.   Screening recommendations/referrals: Colonoscopy: no longer required  Recommended yearly ophthalmology/optometry visit for glaucoma screening and checkup Recommended yearly dental visit for hygiene and checkup  Vaccinations: Influenza vaccine: due in fall 2022  Pneumococcal vaccine: complete series  Tdap vaccine: due with injury  Shingles vaccine: declined     Advanced directives: will provide copies   Conditions/risks identified: none   Next appointment: none   Preventive Care 77 Years and Older, Male Preventive care refers to lifestyle choices and visits with your health care provider that can promote health and wellness. What does preventive care include? A yearly physical exam. This is also called an annual well check. Dental exams once or twice a year. Routine eye exams. Ask your health care provider how often you should have your eyes checked. Personal lifestyle choices, including: Daily care of your teeth and gums. Regular physical activity. Eating a healthy diet. Avoiding tobacco and drug use. Limiting alcohol use. Practicing safe sex. Taking low doses of aspirin every day. Taking vitamin and mineral supplements as recommended by your health care provider. What happens during an annual well check? The services and screenings done by your health care provider during your annual well check will depend on your age, overall health, lifestyle risk factors, and family history of disease. Counseling  Your health care provider may ask you questions about your: Alcohol use. Tobacco use. Drug use. Emotional well-being. Home and relationship well-being. Sexual activity. Eating habits. History of falls. Memory and ability to understand  (cognition). Work and work Statistician. Screening  You may have the following tests or measurements: Height, weight, and BMI. Blood pressure. Lipid and cholesterol levels. These may be checked every 5 years, or more frequently if you are over 87 years old. Skin check. Lung cancer screening. You may have this screening every year starting at age 81 if you have a 30-pack-year history of smoking and currently smoke or have quit within the past 15 years. Fecal occult blood test (FOBT) of the stool. You may have this test every year starting at age 67. Flexible sigmoidoscopy or colonoscopy. You may have a sigmoidoscopy every 5 years or a colonoscopy every 10 years starting at age 35. Prostate cancer screening. Recommendations will vary depending on your family history and other risks. Hepatitis C blood test. Hepatitis B blood test. Sexually transmitted disease (STD) testing. Diabetes screening. This is done by checking your blood sugar (glucose) after you have not eaten for a while (fasting). You may have this done every 1-3 years. Abdominal aortic aneurysm (AAA) screening. You may need this if you are a current or former smoker. Osteoporosis. You may be screened starting at age 17 if you are at high risk. Talk with your health care provider about your test results, treatment options, and if necessary, the need for more tests. Vaccines  Your health care provider may recommend certain vaccines, such as: Influenza vaccine. This is recommended every year. Tetanus, diphtheria, and acellular pertussis (Tdap, Td) vaccine. You may need a Td booster every 10 years. Zoster vaccine. You may need this after age 72. Pneumococcal 13-valent conjugate (PCV13) vaccine. One dose is recommended after age 41. Pneumococcal polysaccharide (PPSV23) vaccine. One dose is recommended after age 31. Talk to your health care provider  about which screenings and vaccines you need and how often you need them. This  information is not intended to replace advice given to you by your health care provider. Make sure you discuss any questions you have with your health care provider. Document Released: 04/26/2015 Document Revised: 12/18/2015 Document Reviewed: 01/29/2015 Elsevier Interactive Patient Education  2017 [employer] Prevention in the Home Falls can cause injuries. They can happen to people of all ages. There are many things you can do to make your home safe and to help prevent falls. What can I do on the outside of my home? Regularly fix the edges of walkways and driveways and fix any cracks. Remove anything that might make you trip as you walk through a door, such as a raised step or threshold. Trim any bushes or trees on the path to your home. Use bright outdoor lighting. Clear any walking paths of anything that might make someone trip, such as rocks or tools. Regularly check to see if handrails are loose or broken. Make sure that both sides of any steps have handrails. Any raised decks and porches should have guardrails on the edges. Have any leaves, snow, or ice cleared regularly. Use sand or salt on walking paths during winter. Clean up any spills in your garage right away. This includes oil or grease spills. What can I do in the bathroom? Use night lights. Install grab bars by the toilet and in the tub and shower. Do not use towel bars as grab bars. Use non-skid mats or decals in the tub or shower. If you need to sit down in the shower, use a plastic, non-slip stool. Keep the floor dry. Clean up any water that spills on the floor as soon as it happens. Remove soap buildup in the tub or shower regularly. Attach bath mats securely with double-sided non-slip rug tape. Do not have throw rugs and other things on the floor that can make you trip. What can I do in the bedroom? Use night lights. Make sure that you have a light by your bed that is easy to reach. Do not use any sheets or  blankets that are too big for your bed. They should not hang down onto the floor. Have a firm chair that has side arms. You can use this for support while you get dressed. Do not have throw rugs and other things on the floor that can make you trip. What can I do in the kitchen? Clean up any spills right away. Avoid walking on wet floors. Keep items that you use a lot in easy-to-reach places. If you need to reach something above you, use a strong step stool that has a grab bar. Keep electrical cords out of the way. Do not use floor polish or wax that makes floors slippery. If you must use wax, use non-skid floor wax. Do not have throw rugs and other things on the floor that can make you trip. What can I do with my stairs? Do not leave any items on the stairs. Make sure that there are handrails on both sides of the stairs and use them. Fix handrails that are broken or loose. Make sure that handrails are as long as the stairways. Check any carpeting to make sure that it is firmly attached to the stairs. Fix any carpet that is loose or worn. Avoid having throw rugs at the top or bottom of the stairs. If you do have throw rugs, attach them to the floor  with carpet tape. Make sure that you have a light switch at the top of the stairs and the bottom of the stairs. If you do not have them, ask someone to add them for you. What else can I do to help prevent falls? Wear shoes that: Do not have high heels. Have rubber bottoms. Are comfortable and fit you well. Are closed at the toe. Do not wear sandals. If you use a stepladder: Make sure that it is fully opened. Do not climb a closed stepladder. Make sure that both sides of the stepladder are locked into place. Ask someone to hold it for you, if possible. Clearly mark and make sure that you can see: Any grab bars or handrails. First and last steps. Where the edge of each step is. Use tools that help you move around (mobility aids) if they are  needed. These include: Canes. Walkers. Scooters. Crutches. Turn on the lights when you go into a dark area. Replace any light bulbs as soon as they burn out. Set up your furniture so you have a clear path. Avoid moving your furniture around. If any of your floors are uneven, fix them. If there are any pets around you, be aware of where they are. Review your medicines with your doctor. Some medicines can make you feel dizzy. This can increase your chance of falling. Ask your doctor what other things that you can do to help prevent falls. This information is not intended to replace advice given to you by your health care provider. Make sure you discuss any questions you have with your health care provider. Document Released: 01/24/2009 Document Revised: 09/05/2015 Document Reviewed: 05/04/2014 Elsevier Interactive Patient Education  2017 [employer].

## 2021-01-27 NOTE — Progress Notes (Signed)
Subjective:   [name] is a 87 y.o. male who presents for an Subsequent  Medicare Annual Wellness Visit.   I connected with [name] today by telephone and verified that I am speaking with the correct person using two identifiers. Location patient: home Location provider: work Persons participating in the virtual visit: patient, provider.   I discussed the limitations, risks, security and privacy concerns of performing an evaluation and management service by telephone and the availability of in person appointments. I also discussed with the patient that there may be a patient responsible charge related to this service. The patient expressed understanding and verbally consented to this telephonic visit.    Interactive audio and video telecommunications were attempted between this provider and patient, however failed, due to patient having technical difficulties OR patient did not have access to video capability.  We continued and completed visit with audio only.    Review of Systems     Cardiac Risk Factors include: advanced age (>39mn, >>7women);diabetes mellitus;dyslipidemia;male gender;hypertension     Objective:    Today's Vitals   There is no height or weight on file to calculate BMI.  Advanced Directives 01/27/2021 07/15/2020 03/21/2020 12/06/2019 03/25/2016 01/29/2016 01/01/2016  Does Patient Have a Medical Advance Directive? Yes Yes Yes Yes Yes Yes Yes  Type of AParamedicof [place] will Living will;Healthcare Power of A[place] CityLiving will [place] will Living will Living will Living will  Copy of [place] Chart? No - copy requested - - No - copy requested - - -    Current Medications (verified) Outpatient Encounter Medications as of 01/27/2021  Medication Sig   acetaminophen (TYLENOL) 500 MG tablet Take 500 mg by mouth every 6 (six) hours as needed.    albuterol (VENTOLIN HFA) 108 (90 Base) MCG/ACT inhaler Inhale 2 puffs into the lungs every 6 (six) hours as needed. (Patient taking differently: Inhale 2 puffs into the lungs every 6 (six) hours as needed for wheezing or shortness of breath.)   Blood Glucose Monitoring Suppl (ACCU-CHEK AVIVA CONNECT) w/Device KIT 1 Device by Does not apply route daily.   Cyanocobalamin (B-12) 1000 MCG TABS Take 1,000 mcg by mouth daily.   finasteride (PROSCAR) 5 MG tablet TAKE 1 TABLET BY MOUTH EVERY DAY   furosemide (LASIX) 40 MG tablet Take 1 tablet (40 mg total) by mouth 2 (two) times daily. (Patient taking differently: Take 40 mg by mouth daily.)   glipiZIDE (GLUCOTROL) 5 MG tablet Take 1 tablet (5 mg total) by mouth daily before breakfast. 20-30 minutes before meal.   isosorbide mononitrate (IMDUR) 60 MG 24 hr tablet Take 1 tablet (60 mg total) by mouth daily.   Lancet Devices (LANCET DEVICE WITH EJECTOR) MISC 1 Device by Does not apply route daily.   loperamide (IMODIUM A-D) 2 MG tablet Take 2 mg by mouth 4 (four) times daily as needed for diarrhea or loose stools.   metoprolol tartrate (LOPRESSOR) 25 MG tablet TAKE 1 TABLET BY MOUTH EVERY DAY   nitroGLYCERIN (NITROSTAT) 0.4 MG SL tablet Place 1 tablet (0.4 mg total) under the tongue every 5 (five) minutes as needed for chest pain.   OneTouch Delica Lancets 375QMISC USE TO CHECK BLOOD SUGAR DAILY AND PRN   ONETOUCH ULTRA test strip USE TO CHECK BLOOD SUGAR DAILY E11.9   rOPINIRole (REQUIP) 0.5 MG tablet TAKE 1 TABLET BY MOUTH EVERYDAY AT BEDTIME   tamsulosin (FLOMAX) 0.4  MG CAPS capsule TAKE 1 CAPSULE BY MOUTH EVERY DAY AFTER SUPPER   tizanidine (ZANAFLEX) 2 MG capsule Take 4 capsules (8 mg total) by mouth at bedtime as needed for muscle spasms.   rosuvastatin (CRESTOR) 10 MG tablet TAKE 1 TABLET BY MOUTH EVERY DAY (Patient not taking: Reported on 01/27/2021)   Facility-Administered Encounter Medications as of 01/27/2021  Medication   triamcinolone  acetonide (KENALOG-40) injection 40 mg    Allergies (verified) Patient has no known allergies.   History: Past Medical History:  Diagnosis Date   BACK PAIN, CHRONIC 04/18/2009   BENIGN PROSTATIC HYPERTROPHY, HX OF 01/19/2007   Carpal tunnel syndrome 03/24/2007   CORONARY ARTERY DISEASE 01/19/2007   DEPRESSION, CHRONIC 06/24/2007   DIABETES MELLITUS 01/19/2007   DIABETES MELLITUS, TYPE II, WITH NEUROLOGICAL COMPLICATIONS 04/19/4713   Diarrhea 02/23/2007   HEART MURMUR, SYSTOLIC 9/53/9672   HERPES SIMPLEX INFECTION 01/19/2007   HYPERLIPIDEMIA 01/19/2007   HYPERTENSION 01/19/2007   Intermediate coronary syndrome ([place]) 08/29/2008   LEG CRAMPS, NOCTURNAL 10/28/2007   Leg cramps, sleep related 08/05/2010   OBSTRUCTIVE SLEEP APNEA 01/19/2007   does not use CPAP   OSTEOARTHRITIS 06/24/2007   Other postprocedural status(V45.89) 01/19/2007   PERCUTANEOUS TRANSLUMINAL CORONARY ANGIOPLASTY, HX OF 01/19/2007   PLANTAR FASCIITIS, RIGHT 01/19/2007   SHOULDER PAIN, LEFT 03/24/2007   SPINAL STENOSIS, LUMBAR 06/03/2009   TINEA PEDIS 11/15/2009   URI 01/11/2009   Past Surgical History:  Procedure Laterality Date   arthroscopy, knee of hx     CARDIAC CATHETERIZATION     CATARACT EXTRACTION Left    COLONOSCOPY     inguinal herniorrhapy, hx     NASAL SINUS SURGERY     percutaneous transluminal coronary angioplasty, hx of  2004   Dr. Lyndel Safe   plastic joint in thumb     PTCA/stent  2004   TONSILLECTOMY     VASECTOMY     Family History  Problem Relation Age of Onset   Arthritis Sister    Diabetes Other    Coronary artery disease Other    Social History   Socioeconomic History   Marital status: Married    Spouse name: Not on file   Number of children: Not on file   Years of education: Not on file   Highest education level: Not on file  Occupational History   Occupation: retired    Comment: Truck Research officer, political party  Tobacco Use   Smoking status: Former    Types: Cigarettes    Quit date: 04/14/1983     Years since quitting: 37.8   Smokeless tobacco: Never   Tobacco comments:    stopped 1985  Vaping Use   Vaping Use: Never used  Substance and Sexual Activity   Alcohol use: Yes    Comment: OCCASSIONAL   Drug use: Not Currently    Types: Marijuana    Comment: occ   Sexual activity: Not on file  Other Topics Concern   Not on file  Social History Narrative   Married in [place] - 100yr, divorced; married 1974 - 1.5 years, divorced; married 1985 1 daughter [phone] daughters; 2 grandchildren.   Social Determinants of Health   Financial Resource Strain: Low Risk    Difficulty of Paying Living Expenses: Not hard at all  Food Insecurity: No Food Insecurity   Worried About RCharity fundraiserin the Last Year: Never true   Ran Out of Food in the Last Year: Never true  Transportation Needs: No Transportation Needs  Lack of Transportation (Medical): No   Lack of Transportation (Non-Medical): No  Physical Activity: Insufficiently Active   Days of Exercise per Week: 1 day   Minutes of Exercise per Session: 10 min  Stress: No Stress Concern Present   Feeling of Stress : Only a little  Social Connections: Moderately Integrated   Frequency of Communication with Friends and Family: Twice a week   Frequency of Social Gatherings with Friends and Family: Twice a week   Attends Religious Services: 1 to 4 times per year   Active Member of [employer] or Organizations: No   Attends Music therapist: Never   Marital Status: Married    Tobacco Counseling Counseling given: Not Answered Tobacco comments: stopped 1985   Clinical Intake:  Pre-visit preparation completed: Yes  Pain : No/denies pain     Nutritional Risks: None Diabetes: Yes CBG done?: No Did pt. bring in CBG monitor from home?: No  How often do you need to have someone help you when you read instructions, pamphlets, or other written materials from your doctor or pharmacy?: 1 - Never What is the last grade  level you completed in school?: High School  Diabetic?yes Nutrition Risk Assessment:  Has the patient had any N/V/D within the last 2 months?  No  Does the patient have any non-healing wounds?  No  Has the patient had any unintentional weight loss or weight gain?  No   Diabetes:  Is the patient diabetic?  Yes  If diabetic, was a CBG obtained today?  No  Did the patient bring in their glucometer from home?  No  How often do you monitor your CBG's?  2 x week  Financial Strains and Diabetes Management:  Are you having any financial strains with the device, your supplies or your medication? No .  Does the patient want to be seen by Chronic Care Management for management of their diabetes?  No  Would the patient like to be referred to a Nutritionist or for Diabetic Management?  No   Diabetic Exams:  Diabetic Eye Exam: Completed 07/2020 Diabetic Foot Exam: Overdue, Pt has been advised about the importance in completing this exam. Pt is scheduled for diabetic foot exam on next office visit .   Interpreter Needed?: No  Information entered by :: Cassandra of Daily Living In your present state of health, do you have any difficulty performing the following activities: 01/27/2021  Hearing? N  Vision? N  Difficulty concentrating or making decisions? N  Walking or climbing stairs? N  Dressing or bathing? N  Doing errands, shopping? N  Preparing Food and eating ? N  Using the Toilet? N  In the past six months, have you accidently leaked urine? N  Do you have problems with loss of bowel control? N  Managing your Medications? N  Managing your Finances? N  Housekeeping or managing your Housekeeping? N  Some recent data might be hidden    Patient Care Team: [name], [name], MD as PCP - General (Family Medicine) Viona Gilmore, [facility] as Pharmacist (Pharmacist)  Indicate any recent Medical Services you may have received from other than [facility] providers in the past year  (date may be approximate).     Assessment:   This is a routine wellness examination for [facility].  Hearing/Vision screen No results found.  Dietary issues and exercise activities discussed: Current Exercise Habits: The patient has a physically strenuous job, but has no regular exercise apart from work., Exercise  limited by: None identified   Goals Addressed             This Visit's Progress    Patient Stated   On track    Stay healthy       Depression Screen PHQ 2/9 Scores 01/27/2021 09/17/2020 12/06/2019 11/08/2018 10/09/2016 01/01/2016  PHQ - 2 Score 0 3 1 4 6 3   PHQ- 9 Score - 13 - 16 18 10     Fall Risk Fall Risk  01/27/2021 07/15/2020 03/21/2020 12/06/2019 11/08/2018  Falls in the past year? 0 1 0 0 1  Number falls in past yr: 0 1 0 0 0  Comment - - - - Right foot fracture.  Injury with Fall? 0 0 0 0 1  Risk for fall due to : - - - Impaired balance/gait;Impaired vision Impaired balance/gait;Impaired mobility;Orthopedic patient  Follow up Falls evaluation completed - - Falls prevention discussed Education provided    [place]:  Any stairs in or around the home? Yes  If so, are there any without handrails? No  Home free of loose throw rugs in walkways, pet beds, electrical cords, etc? Yes  Adequate lighting in your home to reduce risk of falls? Yes   ASSISTIVE DEVICES UTILIZED TO PREVENT FALLS:  Life alert? No  Use of a cane, walker or w/c? Yes  Grab bars in the bathroom? Yes  Shower chair or bench in shower? No  Elevated toilet seat or a handicapped toilet? Yes   Cognitive Function:  Normal cognitive status assessed by direct observation by this Nurse Health Advisor. No abnormalities found.     6CIT Screen 12/06/2019  What Year? 0 points  What month? 0 points  Count back from 20 0 points  Months in reverse 0 points  Repeat phrase 0 points    Immunizations Immunization History  Administered Date(s) Administered   Fluad  Quad(high Dose 65+) 01/30/2019   Influenza Split 01/12/2011, 01/18/2012   Influenza Whole 02/23/2007, 02/01/2008, 01/11/2009, 01/16/2010   Influenza, High Dose Seasonal PF 03/02/2016, 01/15/2017, 12/21/2017   Influenza-Unspecified 01/11/2013, 12/12/2013   PFIZER Comirnaty(Gray Top)Covid-19 Tri-Sucrose Vaccine 09/04/2020   PFIZER(Purple Top)SARS-COV-2 Vaccination 05/02/2019, 05/22/2019, 02/16/2020   Pneumococcal Conjugate-13 01/26/2013   Pneumococcal Polysaccharide-23 07/15/1993   Td 04/04/2009   Zoster, Live 02/12/2012    TDAP status: Due, Education has been provided regarding the importance of this vaccine. Advised may receive this vaccine at local pharmacy or Health Dept. Aware to provide a copy of the vaccination record if obtained from local pharmacy or Health Dept. Verbalized acceptance and understanding.  Flu Vaccine status: Due, Education has been provided regarding the importance of this vaccine. Advised may receive this vaccine at local pharmacy or Health Dept. Aware to provide a copy of the vaccination record if obtained from local pharmacy or Health Dept. Verbalized acceptance and understanding.  Pneumococcal vaccine status: Up to date  Covid-19 vaccine status: Completed vaccines  Qualifies for Shingles Vaccine? Yes   Zostavax completed No   Shingrix Completed?: No.    Education has been provided regarding the importance of this vaccine. Patient has been advised to call insurance company to determine out of pocket expense if they have not yet received this vaccine. Advised may also receive vaccine at local pharmacy or Health Dept. Verbalized acceptance and understanding.  Screening Tests Health Maintenance  Topic Date Due   Zoster Vaccines- Shingrix (1 of 2) Never done   FOOT EXAM  01/30/2020   OPHTHALMOLOGY EXAM  07/02/2020  INFLUENZA VACCINE  11/11/2020   URINE MICROALBUMIN  04/26/2021   HEMOGLOBIN A1C  06/22/2021   COVID-19 Vaccine  Completed   HPV VACCINES  Aged Out    TETANUS/TDAP  Discontinued    Health Maintenance  Health Maintenance Due  Topic Date Due   Zoster Vaccines- Shingrix (1 of 2) Never done   FOOT EXAM  01/30/2020   OPHTHALMOLOGY EXAM  07/02/2020   INFLUENZA VACCINE  11/11/2020    Colorectal cancer screening: No longer required.   Lung Cancer Screening: (Low Dose CT Chest recommended if Age 9-80 years, 30 pack-year currently smoking OR have quit w/in 15years.) does not qualify.   Lung Cancer Screening Referral: n/a  Additional Screening:  Hepatitis C Screening: does not qualify;  Vision Screening: Recommended annual ophthalmology exams for early detection of glaucoma and other disorders of the eye. Is the patient up to date with their annual eye exam?  Yes  Who is the provider or what is the name of the office in which the patient attends annual eye exams? [facility] If pt is not established with a provider, would they like to be referred to a provider to establish care? No .   Dental Screening: Recommended annual dental exams for proper oral hygiene  Community Resource Referral / Chronic Care Management: CRR required this visit?  No   CCM required this visit?  No      Plan:     I have personally reviewed and noted the following in the patient's chart:   Medical and social history Use of alcohol, tobacco or illicit drugs  Current medications and supplements including opioid prescriptions. Patient is not currently taking opioid prescriptions. Functional ability and status Nutritional status Physical activity Advanced directives List of other physicians Hospitalizations, surgeries, and ER visits in previous 12 months Vitals Screenings to include cognitive, depression, and falls Referrals and appointments  In addition, I have reviewed and discussed with patient certain preventive protocols, quality metrics, and best practice recommendations. A written personalized care plan for preventive services as well as  general preventive health recommendations were provided to patient.     Randel Pigg, LPN   60/63/0160   Nurse Notes: none

## 2021-02-05 ENCOUNTER — Other Ambulatory Visit: Payer: Self-pay | Admitting: Family Medicine

## 2021-02-05 ENCOUNTER — Encounter: Payer: Self-pay | Admitting: Family Medicine

## 2021-02-05 ENCOUNTER — Telehealth (INDEPENDENT_AMBULATORY_CARE_PROVIDER_SITE_OTHER): Payer: Medicare HMO | Admitting: Family Medicine

## 2021-02-05 VITALS — Ht 63.0 in

## 2021-02-05 DIAGNOSIS — I5032 Chronic diastolic (congestive) heart failure: Secondary | ICD-10-CM

## 2021-02-05 DIAGNOSIS — U071 COVID-19: Secondary | ICD-10-CM

## 2021-02-05 DIAGNOSIS — R600 Localized edema: Secondary | ICD-10-CM

## 2021-02-05 DIAGNOSIS — J449 Chronic obstructive pulmonary disease, unspecified: Secondary | ICD-10-CM | POA: Diagnosis not present

## 2021-02-05 MED ORDER — MOLNUPIRAVIR EUA 200MG CAPSULE: 4.0000 | ORAL_CAPSULE | Freq: Two times a day (BID) | ORAL | 0 refills | Status: AC

## 2021-02-05 NOTE — Progress Notes (Signed)
Virtual Visit via Video Note I connected with [name] on 02/05/21 by a video enabled telemedicine application and verified that I am speaking with the correct person using two identifiers.  Location patient: home Location provider:work or home office Persons participating in the virtual visit: patient, provider  I discussed the limitations of evaluation and management by telemedicine and the availability of in person appointments. The patient expressed understanding and agreed to proceed.  Chief Complaint  Patient presents with   Covid Positive   HPI: [name] is a 87 year old male with history of DM 2, CAD, hypertension, hyperlipidemia, COPD,chronic diarrhea, and HFpEF complaining of 2 days of respiratory symptoms.  Fatigue, body aches, headache,decreased appetite, nasal congestion, rhinorrhea,mild nonproductive cough, abdominal cramps, and mild nausea.  He thought he had influenza but his wife was Dx'ed with COVID 19 infection; so he did a home COVID 19 test today and it was positive.tested positive.  Negative for fever, sore throat, anosmia, ageusia, vomiting, changes in bowel habits, urinary symptoms, or a skin rash.  Has taken Tylenol.  COPD: He has not used Albuterol inh in a while. Negative for wheezing or SOB.  COVID 19 vaccination completed.  ROS: See pertinent positives and negatives per HPI.  Past Medical History:  Diagnosis Date   BACK PAIN, CHRONIC 04/18/2009   BENIGN PROSTATIC HYPERTROPHY, HX OF 01/19/2007   Carpal tunnel syndrome 03/24/2007   CORONARY ARTERY DISEASE 01/19/2007   DEPRESSION, CHRONIC 06/24/2007   DIABETES MELLITUS 01/19/2007   DIABETES MELLITUS, TYPE II, WITH NEUROLOGICAL COMPLICATIONS 11/15/1322   Diarrhea 02/23/2007   HEART MURMUR, SYSTOLIC 07/13/270   HERPES SIMPLEX INFECTION 01/19/2007   HYPERLIPIDEMIA 01/19/2007   HYPERTENSION 01/19/2007   Intermediate coronary syndrome ([place]) 08/29/2008   LEG CRAMPS, NOCTURNAL 10/28/2007   Leg cramps, sleep  related 08/05/2010   OBSTRUCTIVE SLEEP APNEA 01/19/2007   does not use CPAP   OSTEOARTHRITIS 06/24/2007   Other postprocedural status(V45.89) 01/19/2007   PERCUTANEOUS TRANSLUMINAL CORONARY ANGIOPLASTY, HX OF 01/19/2007   PLANTAR FASCIITIS, RIGHT 01/19/2007   SHOULDER PAIN, LEFT 03/24/2007   SPINAL STENOSIS, LUMBAR 06/03/2009   TINEA PEDIS 11/15/2009   URI 01/11/2009    Past Surgical History:  Procedure Laterality Date   arthroscopy, knee of hx     CARDIAC CATHETERIZATION     CATARACT EXTRACTION Left    COLONOSCOPY     inguinal herniorrhapy, hx     NASAL SINUS SURGERY     percutaneous transluminal coronary angioplasty, hx of  2004   Dr. Lyndel Safe   plastic joint in thumb     PTCA/stent  2004   TONSILLECTOMY     VASECTOMY      Family History  Problem Relation Age of Onset   Arthritis Sister    Diabetes Other    Coronary artery disease Other     Social History   Socioeconomic History   Marital status: Married    Spouse name: Not on file   Number of children: Not on file   Years of education: Not on file   Highest education level: Not on file  Occupational History   Occupation: retired    Comment: Truck Research officer, political party  Tobacco Use   Smoking status: Former    Types: Cigarettes    Quit date: 04/14/1983    Years since quitting: 37.8   Smokeless tobacco: Never   Tobacco comments:    stopped 1985  Vaping Use   Vaping Use: Never used  Substance and Sexual Activity   Alcohol use:  Yes    Comment: OCCASSIONAL   Drug use: Not Currently    Types: Marijuana    Comment: occ   Sexual activity: Not on file  Other Topics Concern   Not on file  Social History Narrative   Married in 1959 - 41yr, divorced; married 1974 - 1.5 years, divorced; married 1985 1 daughter [phone] daughters; 2 grandchildren.   Social Determinants of Health   Financial Resource Strain: Low Risk    Difficulty of Paying Living Expenses: Not hard at all  Food Insecurity: No Food Insecurity   Worried  About RCharity fundraiserin the Last Year: Never true   [place] the Last Year: Never true  Transportation Needs: No Transportation Needs   Lack of Transportation (Medical): No   Lack of Transportation (Non-Medical): No  Physical Activity: Insufficiently Active   Days of Exercise per Week: 1 day   Minutes of Exercise per Session: 10 min  Stress: No Stress Concern Present   Feeling of Stress : Only a little  Social Connections: Moderately Integrated   Frequency of Communication with Friends and Family: Twice a week   Frequency of Social Gatherings with Friends and Family: Twice a week   Attends Religious Services: 1 to 4 times per year   Active Member of [employer] Organizations: No   Attends CMusic therapist Never   Marital Status: Married  IHuman resources officerViolence: Not At Risk   Fear of Current or Ex-Partner: No   Emotionally Abused: No   Physically Abused: No   Sexually Abused: No    Current Outpatient Medications:    acetaminophen (TYLENOL) 500 MG tablet, Take 500 mg by mouth every 6 (six) hours as needed., Disp: , Rfl:    albuterol (VENTOLIN HFA) 108 (90 Base) MCG/ACT inhaler, Inhale 2 puffs into the lungs every 6 (six) hours as needed. (Patient taking differently: Inhale 2 puffs into the lungs every 6 (six) hours as needed for wheezing or shortness of breath.), Disp: 18 g, Rfl: 3   Blood Glucose Monitoring Suppl (ACCU-CHEK AVIVA CONNECT) w/Device KIT, 1 Device by Does not apply route daily., Disp: 1 kit, Rfl: 0   Cyanocobalamin (B-12) 1000 MCG TABS, Take 1,000 mcg by mouth daily., Disp: , Rfl:    finasteride (PROSCAR) 5 MG tablet, TAKE 1 TABLET BY MOUTH EVERY DAY, Disp: 90 tablet, Rfl: 2   furosemide (LASIX) 40 MG tablet, TAKE 1 TABLET BY MOUTH TWICE A DAY, Disp: 180 tablet, Rfl: 1   glipiZIDE (GLUCOTROL) 5 MG tablet, Take 1 tablet (5 mg total) by mouth daily before breakfast. 20-30 minutes before meal., Disp: 90 tablet, Rfl: 1   isosorbide mononitrate  (IMDUR) 60 MG 24 hr tablet, Take 1 tablet (60 mg total) by mouth daily., Disp: 90 tablet, Rfl: 1   Lancet Devices (LANCET DEVICE WITH EJECTOR) MISC, 1 Device by Does not apply route daily., Disp: 1 each, Rfl: 1   loperamide (IMODIUM A-D) 2 MG tablet, Take 2 mg by mouth 4 (four) times daily as needed for diarrhea or loose stools., Disp: , Rfl:    metoprolol tartrate (LOPRESSOR) 25 MG tablet, TAKE 1 TABLET BY MOUTH EVERY DAY, Disp: 90 tablet, Rfl: 2   molnupiravir EUA (LAGEVRIO) 200 mg CAPS capsule, Take 4 capsules (800 mg total) by mouth 2 (two) times daily for 5 days., Disp: 40 capsule, Rfl: 0   nitroGLYCERIN (NITROSTAT) 0.4 MG SL tablet, Place 1 tablet (0.4 mg total) under the tongue  every 5 (five) minutes as needed for chest pain., Disp: 50 tablet, Rfl: 1   OneTouch Delica Lancets 08Q MISC, USE TO CHECK BLOOD SUGAR DAILY AND PRN, Disp: 100 each, Rfl: 4   ONETOUCH ULTRA test strip, USE TO CHECK BLOOD SUGAR DAILY E11.9, Disp: 100 strip, Rfl: 4   rOPINIRole (REQUIP) 0.5 MG tablet, TAKE 1 TABLET BY MOUTH EVERYDAY AT BEDTIME, Disp: 90 tablet, Rfl: 2   rosuvastatin (CRESTOR) 10 MG tablet, TAKE 1 TABLET BY MOUTH EVERY DAY, Disp: 90 tablet, Rfl: 1   tamsulosin (FLOMAX) 0.4 MG CAPS capsule, TAKE 1 CAPSULE BY MOUTH EVERY DAY AFTER SUPPER, Disp: 90 capsule, Rfl: 2   tizanidine (ZANAFLEX) 2 MG capsule, Take 4 capsules (8 mg total) by mouth at bedtime as needed for muscle spasms., Disp: 360 capsule, Rfl: 1  Current Facility-Administered Medications:    triamcinolone acetonide (KENALOG-40) injection 40 mg, 40 mg, Intramuscular, Once, [name], Malka So, MD  EXAM:  VITALS per patient if applicable:Ht 5' 3"  (1.6 m)   BMI 28.17 kg/m   GENERAL: alert, oriented, appears well and in no acute distress  HEENT: atraumatic, conjunctiva clear, no obvious abnormalities on inspection of external nose and ears  NECK: normal movements of the head and neck  LUNGS: on inspection no signs of respiratory distress,  breathing rate appears normal, no obvious gross SOB, gasping or wheezing  CV: no obvious cyanosis  MS: moves all visible extremities without noticeable abnormality  PSYCH/NEURO: pleasant and cooperative, no obvious depression or anxiety, speech and thought processing grossly intact  ASSESSMENT AND PLAN:  Discussed the following assessment and plan:  COVID-19 virus infection - Plan: molnupiravir EUA (LAGEVRIO) 200 mg CAPS capsule We discussed Dx,possible complications and treatment options. He has a mild to moderate case with high risk for complications. We discussed oral antiviral options and side effects. He agrees with trying Molnupiravir. Symptomatic treatment with plenty of fluids,rest,tylenol 500 mg 3-4 times per day prn. He has Flonase nasal spray at home, recommend it daily prn. Nasal saline irrigations as needed. Explained that cough and congestion may last a few more days and even weeks after acute symptoms have resolved. Clearly instructed about warning signs.  Chronic obstructive pulmonary disease, unspecified COPD type ([place]) Without exacerbation at this time. If cough becomes productive,he can start plain Mucinex. If cough gets any worse, recommend starting Albuterol inh 2 puff every 6 hours for a week then as needed.  I discussed the assessment and treatment plan with the patient. [name] was provided an opportunity to ask questions and all were answered. He agreed with the plan and demonstrated an understanding of the instructions.  Return if symptoms worsen or fail to improve.  Victoriana Aziz G. [name], MD [facility]. [place] office.

## 2021-02-07 ENCOUNTER — Telehealth: Payer: Self-pay | Admitting: Family Medicine

## 2021-02-07 NOTE — Telephone Encounter (Signed)
Spoke with [name] and his wife. He is feeling better, started Molnupiravir and has tolerated well. Chills and subjective fever. Productive cough, it is not interfering with sleep. He has taken Benzonatate x 1 and helped. Negative for wheezing,SOB,CP,or palpitations.  [name] is also feeling better. Daughter is bring them food and they do not need anything at this time.  Both were instructed about warning signs. Continue adequate hydration and complete antiviral treatment. Laderrick Wilk [name], MD

## 2021-02-14 ENCOUNTER — Telehealth (INDEPENDENT_AMBULATORY_CARE_PROVIDER_SITE_OTHER): Payer: Medicare HMO | Admitting: Family Medicine

## 2021-02-14 ENCOUNTER — Encounter: Payer: Self-pay | Admitting: Family Medicine

## 2021-02-14 VITALS — Ht 63.0 in

## 2021-02-14 DIAGNOSIS — R5383 Other fatigue: Secondary | ICD-10-CM | POA: Diagnosis not present

## 2021-02-14 DIAGNOSIS — R0981 Nasal congestion: Secondary | ICD-10-CM | POA: Diagnosis not present

## 2021-02-14 DIAGNOSIS — J3489 Other specified disorders of nose and nasal sinuses: Secondary | ICD-10-CM | POA: Diagnosis not present

## 2021-02-14 MED ORDER — FLUTICASONE PROPIONATE 50 MCG/ACT NA SUSP
1.0000 | Freq: Two times a day (BID) | NASAL | 0 refills | Status: DC
Start: 2021-02-14 — End: 2021-03-14

## 2021-02-14 NOTE — Progress Notes (Signed)
Virtual Visit via Video Note I connected with [name] on 02/14/21 by a video enabled telemedicine application and verified that I am speaking with the correct person using two identifiers.  Location patient: home Location provider:work office Persons participating in the virtual visit: patient, wife,provider  I discussed the limitations of evaluation and management by telemedicine and the availability of in person appointments. The patient expressed understanding and agreed to proceed.  Chief Complaint  Patient presents with   Follow-up    Still feeling bad from having covid   HPI: [name] is a 87 yo male with hx of HFpEF,DM II,HTN,CAD,COPD,and chronic pain c/o productive cough, nasal congestion,and fatigue. He was seen virtually on 02/05/21, when he reported a positive home COVID-19 test.  He was treated with Molnupiravir. Negative for fever,chills,decreased appetite,sore throat,CP,wheezing,or SOB. No changes in bowel habits,N/V,or abdominal pain.  Nasal congestion, worse at night, cannot breath through his nose. He is doing nasal saline irrigations as needed.  Couth is productive with a "little" sputum, denies hemoptysis. Exacerbating and alleviating factors have not been identified. He has taken Benzonatate and it seems to help.  Fatigue, does not feel like doing anything. Repeat COVID 19 home test 2 days ago and still positive. Concerned about still being contagious. His wife just recovered from [place] 19 infection.  ROS: See pertinent positives and negatives per HPI.  Past Medical History:  Diagnosis Date   BACK PAIN, CHRONIC 04/18/2009   BENIGN PROSTATIC HYPERTROPHY, HX OF 01/19/2007   Carpal tunnel syndrome 03/24/2007   CORONARY ARTERY DISEASE 01/19/2007   DEPRESSION, CHRONIC 06/24/2007   DIABETES MELLITUS 01/19/2007   DIABETES MELLITUS, TYPE II, WITH NEUROLOGICAL COMPLICATIONS 12/19/9209   Diarrhea 02/23/2007   HEART MURMUR, SYSTOLIC 9/41/7408   HERPES SIMPLEX  INFECTION 01/19/2007   HYPERLIPIDEMIA 01/19/2007   HYPERTENSION 01/19/2007   Intermediate coronary syndrome ([place]) 08/29/2008   LEG CRAMPS, NOCTURNAL 10/28/2007   Leg cramps, sleep related 08/05/2010   OBSTRUCTIVE SLEEP APNEA 01/19/2007   does not use CPAP   OSTEOARTHRITIS 06/24/2007   Other postprocedural status(V45.89) 01/19/2007   PERCUTANEOUS TRANSLUMINAL CORONARY ANGIOPLASTY, HX OF 01/19/2007   PLANTAR FASCIITIS, RIGHT 01/19/2007   SHOULDER PAIN, LEFT 03/24/2007   SPINAL STENOSIS, LUMBAR 06/03/2009   TINEA PEDIS 11/15/2009   URI 01/11/2009    Past Surgical History:  Procedure Laterality Date   arthroscopy, knee of hx     CARDIAC CATHETERIZATION     CATARACT EXTRACTION Left    COLONOSCOPY     inguinal herniorrhapy, hx     NASAL SINUS SURGERY     percutaneous transluminal coronary angioplasty, hx of  2004   Dr. Lyndel Safe   plastic joint in thumb     PTCA/stent  2004   TONSILLECTOMY     VASECTOMY     Family History  Problem Relation Age of Onset   Arthritis Sister    Diabetes Other    Coronary artery disease Other     Social History   Socioeconomic History   Marital status: Married    Spouse name: Not on file   Number of children: Not on file   Years of education: Not on file   Highest education level: Not on file  Occupational History   Occupation: retired    Comment: Truck Research officer, political party  Tobacco Use   Smoking status: Former    Types: Cigarettes    Quit date: 04/14/1983    Years since quitting: 37.8   Smokeless tobacco: Never   Tobacco comments:  stopped 1985  Vaping Use   Vaping Use: Never used  Substance and Sexual Activity   Alcohol use: Yes    Comment: OCCASSIONAL   Drug use: Not Currently    Types: Marijuana    Comment: occ   Sexual activity: Not on file  Other Topics Concern   Not on file  Social History Narrative   Married in 1959 - 25yr, divorced; married 1974 - 1.5 years, divorced; married 1985 1 daughter 168 [id number] daughters; 2 grandchildren.    Social Determinants of Health   Financial Resource Strain: Low Risk    Difficulty of Paying Living Expenses: Not hard at all  Food Insecurity: No Food Insecurity   Worried About RCharity fundraiserin the Last Year: Never true   [place]in the Last Year: Never true  Transportation Needs: No Transportation Needs   Lack of Transportation (Medical): No   Lack of Transportation (Non-Medical): No  Physical Activity: Insufficiently Active   Days of Exercise per Week: 1 day   Minutes of Exercise per Session: 10 min  Stress: No Stress Concern Present   Feeling of Stress : Only a little  Social Connections: Moderately Integrated   Frequency of Communication with Friends and Family: Twice a week   Frequency of Social Gatherings with Friends and Family: Twice a week   Attends Religious Services: 1 to 4 times per year   Active Member of [employer] Organizations: No   Attends CMusic therapist Never   Marital Status: Married  IHuman resources officerViolence: Not At Risk   Fear of Current or Ex-Partner: No   Emotionally Abused: No   Physically Abused: No   Sexually Abused: No    Current Outpatient Medications:    acetaminophen (TYLENOL) 500 MG tablet, Take 500 mg by mouth every 6 (six) hours as needed., Disp: , Rfl:    albuterol (VENTOLIN HFA) 108 (90 Base) MCG/ACT inhaler, Inhale 2 puffs into the lungs every 6 (six) hours as needed. (Patient taking differently: Inhale 2 puffs into the lungs every 6 (six) hours as needed for wheezing or shortness of breath.), Disp: 18 g, Rfl: 3   Blood Glucose Monitoring Suppl (ACCU-CHEK AVIVA CONNECT) w/Device KIT, 1 Device by Does not apply route daily., Disp: 1 kit, Rfl: 0   Cyanocobalamin (B-12) 1000 MCG TABS, Take 1,000 mcg by mouth daily., Disp: , Rfl:    finasteride (PROSCAR) 5 MG tablet, TAKE 1 TABLET BY MOUTH EVERY DAY, Disp: 90 tablet, Rfl: 2   fluticasone (FLONASE) 50 MCG/ACT nasal spray, Place 1 spray into both nostrils 2 (two) times  daily., Disp: 16 g, Rfl: 0   furosemide (LASIX) 40 MG tablet, TAKE 1 TABLET BY MOUTH TWICE A DAY, Disp: 180 tablet, Rfl: 1   glipiZIDE (GLUCOTROL) 5 MG tablet, Take 1 tablet (5 mg total) by mouth daily before breakfast. 20-30 minutes before meal., Disp: 90 tablet, Rfl: 1   isosorbide mononitrate (IMDUR) 60 MG 24 hr tablet, Take 1 tablet (60 mg total) by mouth daily., Disp: 90 tablet, Rfl: 1   Lancet Devices (LANCET DEVICE WITH EJECTOR) MISC, 1 Device by Does not apply route daily., Disp: 1 each, Rfl: 1   loperamide (IMODIUM A-D) 2 MG tablet, Take 2 mg by mouth 4 (four) times daily as needed for diarrhea or loose stools., Disp: , Rfl:    metoprolol tartrate (LOPRESSOR) 25 MG tablet, TAKE 1 TABLET BY MOUTH EVERY DAY, Disp: 90 tablet, Rfl: 2   nitroGLYCERIN (  NITROSTAT) 0.4 MG SL tablet, Place 1 tablet (0.4 mg total) under the tongue every 5 (five) minutes as needed for chest pain., Disp: 50 tablet, Rfl: 1   OneTouch Delica Lancets 33G MISC, USE TO CHECK BLOOD SUGAR DAILY AND PRN, Disp: 100 each, Rfl: 4   ONETOUCH ULTRA test strip, USE TO CHECK BLOOD SUGAR DAILY E11.9, Disp: 100 strip, Rfl: 4   rOPINIRole (REQUIP) 0.5 MG tablet, TAKE 1 TABLET BY MOUTH EVERYDAY AT BEDTIME, Disp: 90 tablet, Rfl: 2   rosuvastatin (CRESTOR) 10 MG tablet, TAKE 1 TABLET BY MOUTH EVERY DAY, Disp: 90 tablet, Rfl: 1   tamsulosin (FLOMAX) 0.4 MG CAPS capsule, TAKE 1 CAPSULE BY MOUTH EVERY DAY AFTER SUPPER, Disp: 90 capsule, Rfl: 2   tizanidine (ZANAFLEX) 2 MG capsule, Take 4 capsules (8 mg total) by mouth at bedtime as needed for muscle spasms., Disp: 360 capsule, Rfl: 1  Current Facility-Administered Medications:    triamcinolone acetonide (KENALOG-40) injection 40 mg, 40 mg, Intramuscular, Once, Jordan, [name], MD  EXAM:  VITALS per patient if applicable:Ht 5' 3" (1.6 m)   BMI 28.17 kg/m   GENERAL: alert, oriented, appears well and in no acute distress  HEENT: atraumatic, conjunctiva clear, no obvious abnormalities on  inspection of external nose and ears  LUNGS: on inspection no signs of respiratory distress, breathing rate appears normal, no obvious gross SOB, gasping or wheezing  CV: no obvious cyanosis  PSYCH/NEURO: pleasant and cooperative, no obvious depression or anxiety, speech and thought processing grossly intact  ASSESSMENT AND PLAN:  Discussed the following assessment and plan:  Nasal congestion with rhinorrhea - Plan: fluticasone (FLONASE) 50 MCG/ACT nasal spray Explained that nasal congestion and cough can last a few more days and even weeks after acute symptoms have resolved. Continue nasal saline irrigations as needed. Recommend Flonase nasal spray 1 puff in each nostril daily for 10 to 14 days, then as needed. We discussed the option of short course of oral prednisone but he prefers not to do so. Continue Benzonatate for cough treatment.  Other fatigue S/p COVID-19 infection, completed antiviral medication. Explained that it is not uncommon to feel fatigued for a few more weeks and even months after COVID-19 infection. Continue daily activity as tolerated. He prefers to hold on chest x-ray for now. Monitor for new symptoms. Clearly instructed about warning signs.  I discussed the assessment and treatment plan with the patient. Mr. Wilemon was provided an opportunity to ask questions and all were answered. He agreed with the plan and demonstrated an understanding of the instructions.  Return if symptoms worsen or fail to improve.  [name]. Jordan, MD  [facility]. [place] office.   

## 2021-02-26 ENCOUNTER — Encounter: Payer: Self-pay | Admitting: Family Medicine

## 2021-02-28 ENCOUNTER — Other Ambulatory Visit: Payer: Self-pay | Admitting: Family Medicine

## 2021-02-28 DIAGNOSIS — R059 Cough, unspecified: Secondary | ICD-10-CM

## 2021-03-04 ENCOUNTER — Other Ambulatory Visit: Payer: Medicare HMO

## 2021-03-04 ENCOUNTER — Other Ambulatory Visit: Payer: Self-pay

## 2021-03-04 ENCOUNTER — Ambulatory Visit (INDEPENDENT_AMBULATORY_CARE_PROVIDER_SITE_OTHER): Payer: Medicare HMO

## 2021-03-04 DIAGNOSIS — J439 Emphysema, unspecified: Secondary | ICD-10-CM | POA: Diagnosis not present

## 2021-03-04 DIAGNOSIS — R059 Cough, unspecified: Secondary | ICD-10-CM

## 2021-03-05 ENCOUNTER — Other Ambulatory Visit: Payer: Self-pay

## 2021-03-05 MED ORDER — DOXYCYCLINE HYCLATE 100 MG PO TABS: 100.0000 mg | ORAL_TABLET | Freq: Two times a day (BID) | ORAL | 0 refills | Status: AC

## 2021-03-13 ENCOUNTER — Other Ambulatory Visit: Payer: Self-pay | Admitting: Family Medicine

## 2021-03-13 ENCOUNTER — Telehealth: Payer: Medicare HMO

## 2021-03-13 DIAGNOSIS — R0981 Nasal congestion: Secondary | ICD-10-CM

## 2021-03-21 ENCOUNTER — Ambulatory Visit: Payer: Medicare HMO | Admitting: Neurology

## 2021-03-26 ENCOUNTER — Telehealth: Payer: Self-pay | Admitting: Pharmacist

## 2021-03-26 NOTE — Chronic Care Management (AMB) (Signed)
° ° °  Chronic Care Management Pharmacy Assistant   Name: [name]  MRN: [id number] DOB: 08/27/1931 03/26/21 APPOINTMENT REMINDER   Called Patient No answer, left message of appointment on 03/28/21 at 12:30 via telephone visit with Jeni Salles, Pharm D.   Notified to have all medications, supplements, blood pressure and/or blood sugar logs available during appointment and to return call if need to reschedule.     Care Gaps: AWV - 10/22 Zoster Vaccine - Overdue Foot Exam- Overdue Eye Exam - Overdue Flu Vaccine - Overdue BP-140/80 (12/23/20) Lab Results  Component Value Date   HGBA1C 7.0 (A) 12/23/2020    Star Rating Drug: Rosuvastatin 42m - Last filled 01/17/2021 90 [facility] Glipizide 574m- Last filled 01/17/2021 90 DS at [facility]    Any gaps in medications fill history? None       Medications: Outpatient Encounter Medications as of 03/26/2021  Medication Sig   acetaminophen (TYLENOL) 500 MG tablet Take 500 mg by mouth every 6 (six) hours as needed.   albuterol (VENTOLIN HFA) 108 (90 Base) MCG/ACT inhaler Inhale 2 puffs into the lungs every 6 (six) hours as needed. (Patient taking differently: Inhale 2 puffs into the lungs every 6 (six) hours as needed for wheezing or shortness of breath.)   Blood Glucose Monitoring Suppl (ACCU-CHEK AVIVA CONNECT) w/Device KIT 1 Device by Does not apply route daily.   Cyanocobalamin (B-12) 1000 MCG TABS Take 1,000 mcg by mouth daily.   finasteride (PROSCAR) 5 MG tablet TAKE 1 TABLET BY MOUTH EVERY DAY   fluticasone (FLONASE) 50 MCG/ACT nasal spray PLACE 1 SPRAY INTO BOTH NOSTRILS 2 (TWO) TIMES DAILY   furosemide (LASIX) 40 MG tablet TAKE 1 TABLET BY MOUTH TWICE A DAY   glipiZIDE (GLUCOTROL) 5 MG tablet Take 1 tablet (5 mg total) by mouth daily before breakfast. 20-30 minutes before meal.   isosorbide mononitrate (IMDUR) 60 MG 24 hr tablet Take 1 tablet (60 mg total) by mouth daily.   Lancet Devices (LANCET DEVICE WITH EJECTOR) MISC 1  Device by Does not apply route daily.   loperamide (IMODIUM A-D) 2 MG tablet Take 2 mg by mouth 4 (four) times daily as needed for diarrhea or loose stools.   metoprolol tartrate (LOPRESSOR) 25 MG tablet TAKE 1 TABLET BY MOUTH EVERY DAY   nitroGLYCERIN (NITROSTAT) 0.4 MG SL tablet Place 1 tablet (0.4 mg total) under the tongue every 5 (five) minutes as needed for chest pain.   OneTouch Delica Lancets 3391YISC USE TO CHECK BLOOD SUGAR DAILY AND PRN   ONETOUCH ULTRA test strip USE TO CHECK BLOOD SUGAR DAILY E11.9   rOPINIRole (REQUIP) 0.5 MG tablet TAKE 1 TABLET BY MOUTH EVERYDAY AT BEDTIME   rosuvastatin (CRESTOR) 10 MG tablet TAKE 1 TABLET BY MOUTH EVERY DAY   tamsulosin (FLOMAX) 0.4 MG CAPS capsule TAKE 1 CAPSULE BY MOUTH EVERY DAY AFTER SUPPER   tizanidine (ZANAFLEX) 2 MG capsule Take 4 capsules (8 mg total) by mouth at bedtime as needed for muscle spasms.   Facility-Administered Encounter Medications as of 03/26/2021  Medication   triamcinolone acetonide (KENALOG-40) injection 40 mg     Patient Assistance: Jardiance 2022- patient declined  [facility] Pharmacist Assistant [phone]

## 2021-03-28 ENCOUNTER — Ambulatory Visit (INDEPENDENT_AMBULATORY_CARE_PROVIDER_SITE_OTHER): Payer: Medicare HMO | Admitting: Pharmacist

## 2021-03-28 DIAGNOSIS — E1140 Type 2 diabetes mellitus with diabetic neuropathy, unspecified: Secondary | ICD-10-CM

## 2021-03-28 DIAGNOSIS — E782 Mixed hyperlipidemia: Secondary | ICD-10-CM

## 2021-03-28 NOTE — Progress Notes (Signed)
Chronic Care Management Pharmacy Note  04/12/2021 Name:  TRON FLYTHE MRN:  [id number] DOB:  [birth year]  Summary: A1c not at goal <7% LDL not at goal < 70   Recommendations/Changes made from today's visit: -Recommended Zetia for LDL lowering  -Recommended weekly BP monitoring   Plan: Tolerance assessment in 3-4 weeks after starting the medication  Subjective: [name] is an 87 y.o. year old male who is a primary patient of [name], Malka So, MD.  The CCM team was consulted for assistance with disease management and care coordination needs.    Engaged with patient by telephone for follow up visit in response to provider referral for pharmacy case management and/or care coordination services.   Consent to Services:  The patient was given information about Chronic Care Management services, agreed to services, and gave verbal consent prior to initiation of services.  Please see initial visit note for detailed documentation.   Patient Care Team: [name], [name], MD as PCP - General (Family Medicine) Viona Gilmore, [facility] as Pharmacist (Pharmacist)  Recent office visits: 02/14/21 [name], MD: Patient presented for video visit for COVID infection coughing. Prescribed Flonase.  02/05/21 [name], MD: Patient presented for video visit for COVID infection. Prescribed molnupiravir and recommended Mucinex.  01/27/21 Randel Pigg, LPN: Patient presented for AWV.  12/23/20 [name], MD: Patient presented for diabetes follow up. Plan to retry rosuvastatin a few days a week. Increased Imdur to 60 mg daily.  Recent consult visits: 12-10-2020 [name], MD (Cardiology) - Patient presented for Coronary artery disease of native artery of native heart with stable angina pectoris and other concerns. Stopped Jardiance 10 mg per pt not taking.  07-15-2020 TatEustace Quail, DO (Neurology) - Patient presented for leg cramps. Stopped Docusate Sodium and Stopped Triamcinolone.    07-02-2020 Oliver Barre ( Ophthalmology )- Patient presented for pre-glaucoma. No medication changes found.   Hospital visits: None in previous 6 months   Objective:  Lab Results  Component Value Date   CREATININE 0.65 12/23/2020   BUN 16 12/23/2020   GFR 85.12 12/23/2020   GFRNONAA >89 08/12/2015   GFRAA >89 08/12/2015   NA 140 12/23/2020   K 3.9 12/23/2020   CALCIUM 9.2 12/23/2020   CO2 26 12/23/2020   GLUCOSE 149 (H) 12/23/2020    Lab Results  Component Value Date/Time   HGBA1C 7.0 (A) 12/23/2020 03:05 PM   HGBA1C 7.5 (H) 08/13/2020 12:54 PM   HGBA1C 6.7 04/26/2020 12:29 PM   HGBA1C 6.7 (A) 11/21/2019 11:12 AM   HGBA1C 6.4 08/15/2019 11:55 AM   FRUCTOSAMINE 276 12/23/2020 04:05 PM   FRUCTOSAMINE 302 (H) 08/13/2020 12:54 PM   GFR 85.12 12/23/2020 04:05 PM   GFR 84.56 08/13/2020 12:54 PM   MICROALBUR 5.3 (H) 04/26/2020 01:08 PM   MICROALBUR 2.3 (H) 01/30/2019 11:42 AM    Last diabetic Eye exam:  Lab Results  Component Value Date/Time   HMDIABEYEEXA No Retinopathy 08/18/2013 12:00 AM    Last diabetic Foot exam: No results found for: HMDIABFOOTEX   Lab Results  Component Value Date   CHOL 168 12/23/2020   HDL 48.60 12/23/2020   LDLCALC 93 12/23/2020   LDLDIRECT 121.0 04/26/2020   TRIG 132.0 12/23/2020   CHOLHDL 3 12/23/2020    Hepatic Function Latest Ref Rng & Units 04/26/2020 03/29/2018 04/23/2017  Total Protein 6.0 - 8.3 g/dL 6.8 6.6 6.5  Albumin 3.5 - 5.2 g/dL 4.7 4.6 4.4  AST 0 - 37 U/L  _0 ALT 0 - 53 U/L _1 Alk Phosphatase 39 - 117 U/L 80 81 71  Total Bilirubin 0.2 - 1.2 mg/dL 0.5 0.5 0.8  Bilirubin, Direct 0.0 - 0.3 mg/dL - - -    Lab Results  Component Value Date/Time   TSH 2.36 08/13/2020 12:54 PM   TSH 1.68 04/01/2017 02:27 PM    CBC Latest Ref Rng & Units 08/13/2020 03/21/2020 04/23/2017  WBC 4.0 - 10.5 K/uL 5.4 5.5 4.2  Hemoglobin 13.0 - 17.0 g/dL 14.5 14.2 15.0  Hematocrit 39.0 - 52.0 % 42.0 42.0 44.4  Platelets 150.0 - 400.0  K/uL 158.0 169.0 133.0(L)    No results found for: VD25OH  Clinical ASCVD: Yes  The ASCVD Risk score (Arnett DK, et al., 2019) failed to calculate for the following reasons:   The 2019 ASCVD risk score is only valid for ages 4 to 42    Depression screen PHQ 2/9 01/27/2021 09/17/2020 12/06/2019  Decreased Interest 0 2 0  Down, Depressed, Hopeless 0 1 1  PHQ - 2 Score 0 3 1  Altered sleeping - 2 -  Tired, decreased energy - 2 -  Change in appetite - 2 -  Feeling bad or failure about yourself  - 0 -  Trouble concentrating - 1 -  Moving slowly or fidgety/restless - 2 -  Suicidal thoughts - 1 -  PHQ-9 Score - 13 -  Difficult doing work/chores - Somewhat difficult -  Some recent data might be hidden    No flowsheet data found.  No flowsheet data found.    Social History   Tobacco Use  Smoking Status Former   Types: Cigarettes   Quit date: 04/14/1983   Years since quitting: 38.0  Smokeless Tobacco Never  Tobacco Comments   stopped 1985   BP Readings from Last 3 Encounters:  12/23/20 140/80  12/10/20 (!) 152/81  09/17/20 126/60   Pulse Readings from Last 3 Encounters:  12/23/20 73  12/10/20 (!) 57  09/17/20 70   Wt Readings from Last 3 Encounters:  12/23/20 159 lb (72.1 kg)  12/10/20 157 lb (71.2 kg)  09/17/20 159 lb (72.1 kg)   BMI Readings from Last 3 Encounters:  02/14/21 28.17 kg/m  02/05/21 28.17 kg/m  12/23/20 28.17 kg/m    Assessment/Interventions: Review of patient past medical history, allergies, medications, health status, including review of consultants reports, laboratory and other test data, was performed as part of comprehensive evaluation and provision of chronic care management services.   SDOH:  (Social Determinants of Health) assessments and interventions performed: No   SDOH Screenings   Alcohol Screen: Low Risk    Last Alcohol Screening Score (AUDIT): 2  Depression (PHQ2-9): Low Risk    PHQ-2 Score: 0  Financial Resource Strain: Low  Risk    Difficulty of Paying Living Expenses: Not hard at all  Food Insecurity: No Food Insecurity   Worried About Charity fundraiser in the Last Year: Never true   Ran Out of Food in the Last Year: Never true  Housing: Low Risk    Last Housing Risk Score: 0  Physical Activity: Insufficiently Active   Days of Exercise per Week: 1 day   Minutes of Exercise per Session: 10 min  Social Connections: Moderately Integrated   Frequency of Communication with Friends and Family: Twice a week   Frequency of Social Gatherings with Friends and Family: Twice a week   Attends Religious Services: 1 to 4 times  per year   Active Member of Clubs or Organizations: No   Attends Archivist Meetings: Never   Marital Status: Married  Stress: No Stress Concern Present   Feeling of Stress : Only a little  Tobacco Use: Medium Risk   Smoking Tobacco Use: Former   Smokeless Tobacco Use: Never   Passive Exposure: Not on file  Transportation Needs: No Transportation Needs   Lack of Transportation (Medical): No   Lack of Transportation (Non-Medical): No   CCM Care Plan  No Known Allergies  Medications Reviewed Today     Reviewed by [name], [name], MD (Physician) on 02/14/21 at 1207  Med List Status: <None>   Medication Order Taking? Sig Documenting Provider Last Dose Status Informant  acetaminophen (TYLENOL) 500 MG tablet [id number] Yes Take 500 mg by mouth every 6 (six) hours as needed. [provider] Taking Active   albuterol (VENTOLIN HFA) 108 (90 Base) MCG/ACT inhaler [id number] Yes Inhale 2 puffs into the lungs every 6 (six) hours as needed.  Patient taking differently: Inhale 2 puffs into the lungs every 6 (six) hours as needed for wheezing or shortness of breath.   [name], [name], MD Taking Active   Blood Glucose Monitoring Suppl (ACCU-CHEK AVIVA CONNECT) w/Device KIT [id number] Yes 1 Device by Does not apply route daily. [name], [name], MD Taking Active   Cyanocobalamin (B-12)  1000 MCG TABS [id number] Yes Take 1,000 mcg by mouth daily. [provider] Taking Active   finasteride (PROSCAR) 5 MG tablet [id number] Yes TAKE 1 TABLET BY MOUTH EVERY DAY [name], [name], MD Taking Active   furosemide (LASIX) 40 MG tablet [id number] Yes TAKE 1 TABLET BY MOUTH TWICE A DAY [name], [name], MD Taking Active   glipiZIDE (GLUCOTROL) 5 MG tablet [id number] Yes Take 1 tablet (5 mg total) by mouth daily before breakfast. 20-30 minutes before meal. [name], [name], MD Taking Active   isosorbide mononitrate (IMDUR) 60 MG 24 hr tablet [id number] Yes Take 1 tablet (60 mg total) by mouth daily. [name], [name], MD Taking Active   Lancet Devices [facility] DEVICE WITH EJECTOR) [place] [id number] Yes 1 Device by Does not apply route daily. [name], [name], MD Taking Active   loperamide (IMODIUM A-D) 2 MG tablet [id number] Yes Take 2 mg by mouth 4 (four) times daily as needed for diarrhea or loose stools. [provider] Taking Active   metoprolol tartrate (LOPRESSOR) 25 MG tablet [id number] Yes TAKE 1 TABLET BY MOUTH EVERY DAY [name], [name], MD Taking Active   nitroGLYCERIN (NITROSTAT) 0.4 MG SL tablet [id number] Yes Place 1 tablet (0.4 mg total) under the tongue every 5 (five) minutes as needed for chest pain. [name], [name], MD Taking Active   OneTouch Delica Lancets 32D MISC [id number] Yes USE TO CHECK BLOOD SUGAR DAILY AND PRN [name], [name], MD Taking Active   [facility] ULTRA test strip [id number] Yes USE TO CHECK BLOOD SUGAR DAILY E11.9 [name], [name], MD Taking Active   rOPINIRole (REQUIP) 0.5 MG tablet [id number] Yes TAKE 1 TABLET BY MOUTH EVERYDAY AT BEDTIME [name], [name], MD Taking Active   rosuvastatin (CRESTOR) 10 MG tablet [id number] Yes TAKE 1 TABLET BY MOUTH EVERY DAY [name], [name], MD Taking Active   tamsulosin (FLOMAX) 0.4 MG CAPS capsule [id number] Yes TAKE 1 CAPSULE BY MOUTH EVERY DAY AFTER SUPPER [name], [name], MD Taking Active   tizanidine (ZANAFLEX) 2 MG capsule  [id number] Yes Take 4 capsules (8 mg total) by  mouth at bedtime as needed for muscle spasms. [name], [name], MD Taking Active   triamcinolone acetonide [facility]) injection 40 mg [id number]   [name], [name], MD  Active             Patient Active Problem List   Diagnosis Date Noted   Urine incontinence 03/29/2018   Bilateral lower extremity edema 03/29/2018   Chronic otitis externa of both ears 03/29/2018   Forgetfulness 12/17/2017   Peripheral neuropathy 10/30/2016   Chronic fatigue 10/30/2016   Knee osteoarthritis 07/26/2016   Chronic diarrhea 10/16/2015   Murmur 08/29/2015   Sinus bradycardia 08/29/2015   Myalgia due to statin 08/29/2015   Cramp of both lower extremities 08/29/2015   Insomnia 08/12/2015   (HFpEF) heart failure with preserved ejection fraction ([place]) 07/22/2015   COPD (chronic obstructive pulmonary disease) ([place]) 07/22/2015   Spondylolisthesis of lumbar region 05/02/2013   Cervical spine disease 04/16/2013   Advanced care planning/counseling discussion 03/02/2012   BPH with urinary obstruction 03/22/2011   Diabetes mellitus type 2 with neurological manifestations ([place]) 06/17/2010   SPINAL STENOSIS, LUMBAR 06/03/2009   BACK PAIN, CHRONIC 04/18/2009   HEART MURMUR, SYSTOLIC 14/48/1856   Cramp of limb 10/28/2007   Depression, major, recurrent, moderate ([place]) 06/24/2007   Generalized osteoarthritis of multiple sites 06/24/2007   CARPAL TUNNEL SYNDROME 03/24/2007   HERPES SIMPLEX INFECTION 01/19/2007   Type 2 diabetes mellitus with diabetic neuropathy, unspecified ([place]) 01/19/2007   Hyperlipidemia, mixed 01/19/2007   OBSTRUCTIVE SLEEP APNEA 01/19/2007   Hypertension with heart disease 01/19/2007   Coronary atherosclerosis 01/19/2007   BPH associated with nocturia 01/19/2007   PERCUTANEOUS TRANSLUMINAL CORONARY ANGIOPLASTY, HX OF 01/19/2007    Immunization History  Administered Date(s) Administered   Fluad Quad(high Dose 65+) 01/30/2019   Influenza  Split 01/12/2011, 01/18/2012   Influenza Whole 02/23/2007, 02/01/2008, 01/11/2009, 01/16/2010   Influenza, High Dose Seasonal PF 03/02/2016, 01/15/2017, 12/21/2017   Influenza-Unspecified 01/11/2013, 12/12/2013   PFIZER Comirnaty(Gray Top)Covid-19 Tri-Sucrose Vaccine 09/04/2020   PFIZER(Purple Top)SARS-COV-2 Vaccination 05/02/2019, 05/22/2019, 02/16/2020   Pneumococcal Conjugate-13 01/26/2013   Pneumococcal Polysaccharide-23 07/15/1993   Td 04/04/2009   Zoster, Live 02/12/2012    Conditions to be addressed/monitored:  Hypertension, Hyperlipidemia, Diabetes, Heart Failure, COPD, and BPH  Conditions addressed this visit: Hypertension, hyperlipidemia, diabetes  Care Plan : [place]  Updates made by Viona Gilmore, [place] since 04/12/2021 12:00 AM     Problem: Problem:Hypertension, Hyperlipidemia, Diabetes, Heart Failure, COPD, and BPH      Long-Range Goal: Patient-Specific Goal   Start Date: 11/19/2020  Expected End Date: 11/19/2021  Recent Progress: On track  Priority: High  Note:   Current Barriers:  Unable to independently afford treatment regimen Unable to independently monitor therapeutic efficacy Unable to achieve control of cholesterol  Unable to maintain control of diabetes  Pharmacist Clinical Goal(s):  Patient will verbalize ability to afford treatment regimen achieve adherence to monitoring guidelines and medication adherence to achieve therapeutic efficacy achieve control of cholesterol as evidenced by lipid panel maintain control of diabetes as evidenced by A1c  through collaboration with PharmD and provider.   Interventions: 1:1 collaboration with [name], [name], MD regarding development and update of comprehensive plan of care as evidenced by provider attestation and co-signature Inter-disciplinary care team collaboration (see longitudinal plan of care) Comprehensive medication review performed; medication list updated in electronic medical  record  Hypertension (BP goal <140/90) -Controlled -Current treatment: Furosemide 40 mg 1 tablet twice daily - only taking once a day  lately Metoprolol tartrate 25 mg 1/2 tablet every day -Medications previously tried: n/a  -Current home readings: has an arm cuff that is older and does not use -Current dietary habits: doesn't eat canned vegetables -Current exercise habits: limited -Denies hypotensive/hypertensive symptoms -Educated on BP goals and benefits of medications for prevention of heart attack, stroke and kidney damage; Daily salt intake goal < 2300 mg; Exercise goal of 150 minutes per week; Importance of home blood pressure monitoring; Proper BP monitoring technique; -Counseled to monitor BP at home weekly, document, and provide log at future appointments -Counseled on diet and exercise extensively Recommended to continue current medication  Coronary atherosclerosis/chest pain/angina (Goal: prevent heart events and minimize chest pain) -Controlled -Current treatment  Nitroglycerin 0.4 mg SL tabs as needed Isosorbide mononitrate 60 mg 1 tablet daily -Medications previously tried: none  -Recommended to continue current medication   Hyperlipidemia: (LDL goal < 70) -Uncontrolled -Current treatment: No medications -Medications previously tried: simvastatin, rosuvastatin (cramping) -Current dietary patterns: eating "healthier"; not eating many donuts or greasy foods -Current exercise habits: minimal -Educated on Cholesterol goals;  Benefits of statin for ASCVD risk reduction; Importance of limiting foods high in cholesterol; Exercise goal of 150 minutes per week; -Counseled on diet and exercise extensively Recommended trial of Zetia for LDL lowering as patient does not wish to use an injectable.  Diabetes (A1c goal <7%) -Uncontrolled -Current medications: Glipizide 5 mg 1 tablet daily (alternates with twice daily) -Medications previously tried: Geneticist, molecular (cost)   -Current home glucose readings fasting glucose: 134, 140, 108, 164 post prandial glucose: 158  -Denies hypoglycemic/hyperglycemic symptoms -Current meal patterns:  breakfast: refer to above  lunch: refer to above  dinner: refer to above snacks: ice cream (buys every 3-4 months); not eating a lot of sweets drinks: loves alcohol (recommended 1-2 glasses); water; no soda only tonic water;  -Current exercise: minimal -Educated on A1c and blood sugar goals; Prevention and management of hypoglycemic episodes; Benefits of routine self-monitoring of blood sugar; Carbohydrate counting and/or plate method -Counseled to check feet daily and get yearly eye exams -Counseled on diet and exercise extensively Recommended to continue current medication   Heart Failure (Goal: manage symptoms and prevent exacerbations) -Controlled -Last ejection fraction:  60-65% (Date: 08/31/17) -HF type: Diastolic -NYHA Class: III (marked limitation of activity) -AHA HF Stage: C (Heart disease and symptoms present) -Current treatment: Furosemide 40 mg 1 tablet twice daily Metoprolol tartrate 25 mg 1 tablet every day -Medications previously tried: none  -Current home BP/HR readings: does not check -Current dietary habits: does not eat canned vegetables -Current exercise habits: minimal -Educated on Importance of weighing daily; if you gain more than 3 pounds in one day or 5 pounds in one week, call cardiologist. Proper diuretic administration and potassium supplementation Importance of blood pressure control -Counseled on diet and exercise extensively Recommended to continue current medication  BPH (Goal: minimize symptoms of enlarged prostate) -Controlled -Current treatment  Finasteride 5 mg 1 tablet daily Tamsulosin 0.4 mg 1 capsule daily after supper -Medications previously tried: none  -Recommended to continue current medication  Lower extremity cramping (Goal: minimize pain with  cramping) -Uncontrolled -Current treatment  Acetaminophen 650 mg 2 tablets every 8 hours as needed Ropinirole 0.5 mg 1 tablet at bedtime Tizanidine 2 mg 4 capsules at bedtime -Medications previously tried: oxcarbazepine (ineffective), diclofenac gel -Recommended trial of magnesium 400 mg daily if he has not tried before.    Health Maintenance -Vaccine gaps:  shingrix, influenza -Current therapy:  Vitamin B12 1000  mcg daily -Educated on Cost vs benefit of each product must be carefully weighed by individual consumer -Patient is satisfied with current therapy and denies issues -Recommended to continue current medication  Patient Goals/Self-Care Activities Patient will:  - take medications as prescribed check glucose daily, document, and provide at future appointments check blood pressure weekly, document, and provide at future appointments target a minimum of 150 minutes of moderate intensity exercise weekly  Follow Up Plan: The care management team will reach out to the patient again over the next 30 days.        Medication Assistance:  Will reassess the need for Jardiance PAP at follow up.  Compliance/Adherence/Medication fill history: Care Gaps: Shingrix, influenza, foot exam, eye exam BP-140/80 (12/23/20) A1c: 7.0% (12/23/20)  Star-Rating Drugs: Rosuvastatin 51m - [facility] Glipizide 531m- Last filled [facility]/17/2021 [facility]  [facility]  [id number] [place] [id number]: [phone]: [phone] Of [facility] LLC[address] Sw[address]agle[address]EThe [id number]: [phone]: [address] [place]one: [phone]: [phone] pill box? Yes - in  AM/PM (8 days) Pt endorses 80% compliance (glipizide) - metoprolol  We discussed: Benefits of medication synchronization, packaging and delivery as well as enhanced pharmacist oversight with [employer]. Patient decided to: Continue current medication management strategy  Care Plan and Follow Up Patient Decision:  Patient agrees to Care Plan and Follow-up.  Plan: Telephone follow up appointment with care management team member scheduled for:  4 months  MadJeni SallesharmD, BCATollesonarmacist Le[place]ronton [phone]

## 2021-04-12 DIAGNOSIS — I25119 Atherosclerotic heart disease of native coronary artery with unspecified angina pectoris: Secondary | ICD-10-CM | POA: Diagnosis not present

## 2021-04-12 DIAGNOSIS — Z7984 Long term (current) use of oral hypoglycemic drugs: Secondary | ICD-10-CM

## 2021-04-12 DIAGNOSIS — E1159 Type 2 diabetes mellitus with other circulatory complications: Secondary | ICD-10-CM

## 2021-04-12 DIAGNOSIS — E782 Mixed hyperlipidemia: Secondary | ICD-10-CM

## 2021-04-12 DIAGNOSIS — I509 Heart failure, unspecified: Secondary | ICD-10-CM | POA: Diagnosis not present

## 2021-04-12 DIAGNOSIS — I110 Hypertensive heart disease with heart failure: Secondary | ICD-10-CM

## 2021-04-12 NOTE — Patient Instructions (Signed)
Hi Tom,  It was great to catch up with you again!  Please reach out to me if you have any questions or need anything before our follow up!  Best, Maddie  Jeni Salles, PharmD, [facility]   Visit Information   Goals Addressed   None    Patient Care Plan: CCM Pharmacy Care Plan     Problem Identified: Problem:Hypertension, Hyperlipidemia, Diabetes, Heart Failure, COPD, and BPH      Long-Range Goal: Patient-Specific Goal   Start Date: 11/19/2020  Expected End Date: 11/19/2021  Recent Progress: On track  Priority: High  Note:   Current Barriers:  Unable to independently afford treatment regimen Unable to independently monitor therapeutic efficacy Unable to achieve control of cholesterol  Unable to maintain control of diabetes  Pharmacist Clinical Goal(s):  Patient will verbalize ability to afford treatment regimen achieve adherence to monitoring guidelines and medication adherence to achieve therapeutic efficacy achieve control of cholesterol as evidenced by lipid panel maintain control of diabetes as evidenced by A1c  through collaboration with PharmD and provider.   Interventions: 1:1 collaboration with [name], [name], MD regarding development and update of comprehensive plan of care as evidenced by provider attestation and co-signature Inter-disciplinary care team collaboration (see longitudinal plan of care) Comprehensive medication review performed; medication list updated in electronic medical record  Hypertension (BP goal <140/90) -Controlled -Current treatment: Furosemide 40 mg 1 tablet twice daily - only taking once a day lately Metoprolol tartrate 25 mg 1/2 tablet every day -Medications previously tried: n/a  -Current home readings: has an arm cuff that is older and does not use -Current dietary habits: doesn't eat canned vegetables -Current exercise habits: limited -Denies  hypotensive/hypertensive symptoms -Educated on BP goals and benefits of medications for prevention of heart attack, stroke and kidney damage; Daily salt intake goal < 2300 mg; Exercise goal of 150 minutes per week; Importance of home blood pressure monitoring; Proper BP monitoring technique; -Counseled to monitor BP at home weekly, document, and provide log at future appointments -Counseled on diet and exercise extensively Recommended to continue current medication  Coronary atherosclerosis/chest pain/angina (Goal: prevent heart events and minimize chest pain) -Controlled -Current treatment  Nitroglycerin 0.4 mg SL tabs as needed Isosorbide mononitrate 60 mg 1 tablet daily -Medications previously tried: none  -Recommended to continue current medication   Hyperlipidemia: (LDL goal < 70) -Uncontrolled -Current treatment: No medications -Medications previously tried: simvastatin, rosuvastatin (cramping) -Current dietary patterns: eating "healthier"; not eating many donuts or greasy foods -Current exercise habits: minimal -Educated on Cholesterol goals;  Benefits of statin for ASCVD risk reduction; Importance of limiting foods high in cholesterol; Exercise goal of 150 minutes per week; -Counseled on diet and exercise extensively Recommended trial of Zetia for LDL lowering as patient does not wish to use an injectable.  Diabetes (A1c goal <7%) -Uncontrolled -Current medications: Glipizide 5 mg 1 tablet daily (alternates with twice daily) -Medications previously tried: Geneticist, molecular (cost)  -Current home glucose readings fasting glucose: 134, 140, 108, 164 post prandial glucose: 158  -Denies hypoglycemic/hyperglycemic symptoms -Current meal patterns:  breakfast: refer to above  lunch: refer to above  dinner: refer to above snacks: ice cream (buys every 3-4 months); not eating a lot of sweets drinks: loves alcohol (recommended 1-2 glasses); water; no soda only tonic water;   -Current exercise: minimal -Educated on A1c and blood sugar goals; Prevention and management of hypoglycemic episodes; Benefits of routine self-monitoring of blood sugar; Carbohydrate counting and/or plate  method -Counseled to check feet daily and get yearly eye exams -Counseled on diet and exercise extensively Recommended to continue current medication   Heart Failure (Goal: manage symptoms and prevent exacerbations) -Controlled -Last ejection fraction:  60-65% (Date: 08/31/17) -HF type: Diastolic -NYHA Class: III (marked limitation of activity) -AHA HF Stage: C (Heart disease and symptoms present) -Current treatment: Furosemide 40 mg 1 tablet twice daily Metoprolol tartrate 25 mg 1 tablet every day -Medications previously tried: none  -Current home BP/HR readings: does not check -Current dietary habits: does not eat canned vegetables -Current exercise habits: minimal -Educated on Importance of weighing daily; if you gain more than 3 pounds in one day or 5 pounds in one week, call cardiologist. Proper diuretic administration and potassium supplementation Importance of blood pressure control -Counseled on diet and exercise extensively Recommended to continue current medication  BPH (Goal: minimize symptoms of enlarged prostate) -Controlled -Current treatment  Finasteride 5 mg 1 tablet daily Tamsulosin 0.4 mg 1 capsule daily after supper -Medications previously tried: none  -Recommended to continue current medication  Lower extremity cramping (Goal: minimize pain with cramping) -Uncontrolled -Current treatment  Acetaminophen 650 mg 2 tablets every 8 hours as needed Ropinirole 0.5 mg 1 tablet at bedtime Tizanidine 2 mg 4 capsules at bedtime -Medications previously tried: oxcarbazepine (ineffective), diclofenac gel -Recommended trial of magnesium 400 mg daily if he has not tried before.    Health Maintenance -Vaccine gaps:  shingrix, influenza -Current therapy:   Vitamin B12 1000 mcg daily -Educated on Cost vs benefit of each product must be carefully weighed by individual consumer -Patient is satisfied with current therapy and denies issues -Recommended to continue current medication  Patient Goals/Self-Care Activities Patient will:  - take medications as prescribed check glucose daily, document, and provide at future appointments check blood pressure weekly, document, and provide at future appointments target a minimum of 150 minutes of moderate intensity exercise weekly  Follow Up Plan: The care management team will reach out to the patient again over the next 30 days.         Patient verbalizes understanding of instructions provided today and agrees to view in [place].  The pharmacy team will reach out to the patient again over the next 30 days.   Viona Gilmore, [facility]

## 2021-04-24 ENCOUNTER — Other Ambulatory Visit: Payer: Self-pay | Admitting: Family Medicine

## 2021-04-24 DIAGNOSIS — R252 Cramp and spasm: Secondary | ICD-10-CM

## 2021-04-26 DIAGNOSIS — I509 Heart failure, unspecified: Secondary | ICD-10-CM | POA: Diagnosis not present

## 2021-04-26 DIAGNOSIS — I110 Hypertensive heart disease with heart failure: Secondary | ICD-10-CM | POA: Diagnosis not present

## 2021-04-26 DIAGNOSIS — G20 Parkinson's disease: Secondary | ICD-10-CM | POA: Diagnosis not present

## 2021-04-26 DIAGNOSIS — E1142 Type 2 diabetes mellitus with diabetic polyneuropathy: Secondary | ICD-10-CM | POA: Diagnosis not present

## 2021-04-26 DIAGNOSIS — M1990 Unspecified osteoarthritis, unspecified site: Secondary | ICD-10-CM | POA: Diagnosis not present

## 2021-04-26 DIAGNOSIS — E1165 Type 2 diabetes mellitus with hyperglycemia: Secondary | ICD-10-CM | POA: Diagnosis not present

## 2021-04-26 DIAGNOSIS — R32 Unspecified urinary incontinence: Secondary | ICD-10-CM | POA: Diagnosis not present

## 2021-04-26 DIAGNOSIS — R69 Illness, unspecified: Secondary | ICD-10-CM | POA: Diagnosis not present

## 2021-04-26 DIAGNOSIS — I25119 Atherosclerotic heart disease of native coronary artery with unspecified angina pectoris: Secondary | ICD-10-CM | POA: Diagnosis not present

## 2021-04-26 DIAGNOSIS — N529 Male erectile dysfunction, unspecified: Secondary | ICD-10-CM | POA: Diagnosis not present

## 2021-04-26 DIAGNOSIS — E261 Secondary hyperaldosteronism: Secondary | ICD-10-CM | POA: Diagnosis not present

## 2021-04-26 DIAGNOSIS — N400 Enlarged prostate without lower urinary tract symptoms: Secondary | ICD-10-CM | POA: Diagnosis not present

## 2021-05-02 ENCOUNTER — Ambulatory Visit (INDEPENDENT_AMBULATORY_CARE_PROVIDER_SITE_OTHER): Payer: Medicare HMO | Admitting: Family Medicine

## 2021-05-02 ENCOUNTER — Encounter: Payer: Self-pay | Admitting: Family Medicine

## 2021-05-02 VITALS — BP 148/90 | HR 81 | Resp 16 | Ht 63.0 in | Wt 159.2 lb

## 2021-05-02 DIAGNOSIS — I25118 Atherosclerotic heart disease of native coronary artery with other forms of angina pectoris: Secondary | ICD-10-CM | POA: Diagnosis not present

## 2021-05-02 DIAGNOSIS — E782 Mixed hyperlipidemia: Secondary | ICD-10-CM | POA: Diagnosis not present

## 2021-05-02 DIAGNOSIS — R519 Headache, unspecified: Secondary | ICD-10-CM

## 2021-05-02 DIAGNOSIS — I119 Hypertensive heart disease without heart failure: Secondary | ICD-10-CM

## 2021-05-02 DIAGNOSIS — R600 Localized edema: Secondary | ICD-10-CM

## 2021-05-02 MED ORDER — ISOSORBIDE MONONITRATE ER 60 MG PO TB24: 90.0000 mg | ORAL_TABLET | Freq: Every day | ORAL | 1 refills | Status: DC

## 2021-05-02 NOTE — Assessment & Plan Note (Signed)
CP has improved. He is not interested in further work-up or following with cardiologist. He has not tolerated stains well, he agrees with trying Rosuvastatin a couple times per week. Because elevated BP,Imdur increased from 60 to 90 mg, we discussed some sdie effects. Instructed about warning signs.

## 2021-05-02 NOTE — Progress Notes (Signed)
[name] is a 88 y.o.male with hx of CAD,OSA not on CPAP,chronic diarrhea,DM II,and DM II  here today with his daughter to follow on HTN.  Last follow up visit: 02/05/21 Recent home nurse visit, BP was "high" x 3, he is not sure about readings. He was instructed to monitor BP a couple times per day. BP's 148-161/50's-80, a few  SBP 110's-130's.  Medications:Metoprolol succinate 25 mg 1/2 tab daily and Imdur 60 mg daily. Side effects:None. Metoprolol succinate was decreased because bradycardia. HR at home usually mid 50's, 48/min x 1.  Negative for visual changes, focal weakness, or worsening edema. Chest pain is seldom, improved since Imdur was increased from 30 mg to 60 mg. Mild SOB exacerbated by exertion. Albuterol inh helps. No associated cough or wheezing.  Negative for orthopnea and PND. He acknowledges he has been eating more salty foods, potato chips. He was on furosemide 40 mg twice daily, he has not taking medication daily for a few months.  He took it recently because weight gain. He has not noted improvement in lower extremity edema. He is no longer following with cardiologist, last visit on 12/10/20. CAD: He does not want to pursue further work up or treatment.  Lab Results  Component Value Date   CREATININE 0.65 12/23/2020   BUN 16 12/23/2020   NA 140 12/23/2020   K 3.9 12/23/2020   CL 105 12/23/2020   CO2 26 12/23/2020   Hyperlipidemia: He is not taking rosuvastatin, it seems to aggravate muscle cramps. He has years hx of muscle crams, sometimes interfere with sleep.  He is trying to follow low-fat diet, stop eating ice cream and decreased alcohol intake.  Lab Results  Component Value Date   CHOL 168 12/23/2020   HDL 48.60 12/23/2020   LDLCALC 93 12/23/2020   LDLDIRECT 121.0 04/26/2020   TRIG 132.0 12/23/2020   CHOLHDL 3 12/23/2020   Today he is also complaining of parietal pressure headache, mild, 3/10.  No associated symptoms. He is not sure  for how long he has been having headache or how often it happens. No associated neck pain. Stuffy nose mainly at bedtime, Flonase nasal spray helps some.  Review of Systems  Constitutional:  Positive for fatigue.  HENT:  Negative for nosebleeds and sore throat.   Gastrointestinal:  Negative for abdominal pain, nausea and vomiting.  Musculoskeletal:  Positive for arthralgias, gait problem and myalgias.  Skin:  Negative for pallor and rash.  Allergic/Immunologic: Positive for environmental allergies.  Neurological:  Negative for syncope, facial asymmetry and weakness.  Psychiatric/Behavioral:  Positive for sleep disturbance. Negative for confusion.   Rest see pertinent positives and negatives per HPI.  Current Outpatient Medications on File Prior to Visit  Medication Sig Dispense Refill   acetaminophen (TYLENOL) 500 MG tablet Take 500 mg by mouth every 6 (six) hours as needed.     albuterol (VENTOLIN HFA) 108 (90 Base) MCG/ACT inhaler Inhale 2 puffs into the lungs every 6 (six) hours as needed. (Patient taking differently: Inhale 2 puffs into the lungs every 6 (six) hours as needed for wheezing or shortness of breath.) 18 g 3   Blood Glucose Monitoring Suppl (ACCU-CHEK AVIVA CONNECT) w/Device KIT 1 Device by Does not apply route daily. 1 kit 0   Cyanocobalamin (B-12) 1000 MCG TABS Take 1,000 mcg by mouth daily.     finasteride (PROSCAR) 5 MG tablet TAKE 1 TABLET BY MOUTH EVERY DAY 90 tablet 2   fluticasone (FLONASE) 50 MCG/ACT  nasal spray PLACE 1 SPRAY INTO BOTH NOSTRILS 2 (TWO) TIMES DAILY 16 mL 2   glipiZIDE (GLUCOTROL) 5 MG tablet Take 1 tablet (5 mg total) by mouth daily before breakfast. 20-30 minutes before meal. 90 tablet 1   Lancet Devices (LANCET DEVICE WITH EJECTOR) MISC 1 Device by Does not apply route daily. 1 each 1   loperamide (IMODIUM A-D) 2 MG tablet Take 2 mg by mouth 4 (four) times daily as needed for diarrhea or loose stools.     metoprolol tartrate (LOPRESSOR) 25 MG  tablet TAKE 1 TABLET BY MOUTH EVERY DAY 90 tablet 2   nitroGLYCERIN (NITROSTAT) 0.4 MG SL tablet Place 1 tablet (0.4 mg total) under the tongue every 5 (five) minutes as needed for chest pain. 50 tablet 1   OneTouch Delica Lancets 44H MISC USE TO CHECK BLOOD SUGAR DAILY AND PRN 100 each 4   ONETOUCH ULTRA test strip USE TO CHECK BLOOD SUGAR DAILY E11.9 100 strip 4   rOPINIRole (REQUIP) 0.5 MG tablet TAKE 1 TABLET BY MOUTH EVERYDAY AT BEDTIME 90 tablet 2   tamsulosin (FLOMAX) 0.4 MG CAPS capsule TAKE 1 CAPSULE BY MOUTH EVERY DAY AFTER SUPPER 90 capsule 2   tizanidine (ZANAFLEX) 2 MG capsule TAKE 4 CAPSULES (8 MG TOTAL) BY MOUTH AT BEDTIME AS NEEDED FOR MUSCLE SPASMS. 360 capsule 1   furosemide (LASIX) 40 MG tablet TAKE 1 TABLET BY MOUTH TWICE A DAY 180 tablet 1   rosuvastatin (CRESTOR) 10 MG tablet TAKE 1 TABLET BY MOUTH EVERY DAY 90 tablet 1   Current Facility-Administered Medications on File Prior to Visit  Medication Dose Route Frequency Provider Last Rate Last Admin   triamcinolone acetonide (KENALOG-40) injection 40 mg  40 mg Intramuscular Once [name], Deshaun Weisinger G, MD        Past Medical History:  Diagnosis Date   BACK PAIN, CHRONIC 04/18/2009   BENIGN PROSTATIC HYPERTROPHY, HX OF 01/19/2007   Carpal tunnel syndrome 03/24/2007   CORONARY ARTERY DISEASE 01/19/2007   DEPRESSION, CHRONIC 06/24/2007   DIABETES MELLITUS 01/19/2007   DIABETES MELLITUS, TYPE II, WITH NEUROLOGICAL COMPLICATIONS 09/18/5914   Diarrhea 02/23/2007   HEART MURMUR, SYSTOLIC 3/84/6659   HERPES SIMPLEX INFECTION 01/19/2007   HYPERLIPIDEMIA 01/19/2007   HYPERTENSION 01/19/2007   Intermediate coronary syndrome ([place]) 08/29/2008   LEG CRAMPS, NOCTURNAL 10/28/2007   Leg cramps, sleep related 08/05/2010   OBSTRUCTIVE SLEEP APNEA 01/19/2007   does not use CPAP   OSTEOARTHRITIS 06/24/2007   Other postprocedural status(V45.89) 01/19/2007   PERCUTANEOUS TRANSLUMINAL CORONARY ANGIOPLASTY, HX OF 01/19/2007   PLANTAR FASCIITIS, RIGHT  01/19/2007   SHOULDER PAIN, LEFT 03/24/2007   SPINAL STENOSIS, LUMBAR 06/03/2009   TINEA PEDIS 11/15/2009   URI 01/11/2009   No Known Allergies  Social History   Socioeconomic History   Marital status: Married    Spouse name: Not on file   Number of children: Not on file   Years of education: Not on file   Highest education level: Not on file  Occupational History   Occupation: retired    Comment: Truck Research officer, political party  Tobacco Use   Smoking status: Former    Types: Cigarettes    Quit date: 04/14/1983    Years since quitting: 38.0   Smokeless tobacco: Never   Tobacco comments:    stopped 1985  Vaping Use   Vaping Use: Never used  Substance and Sexual Activity   Alcohol use: Yes    Comment: OCCASSIONAL   Drug use: Not Currently  Types: Marijuana    Comment: occ   Sexual activity: Not on file  Other Topics Concern   Not on file  Social History Narrative   Married in 1959 - 41yr, divorced; married 1974 - 1.5 years, divorced; married 1985 1 daughter [phone] daughters; 2 grandchildren.   Social Determinants of Health   Financial Resource Strain: Low Risk    Difficulty of Paying Living Expenses: Not hard at all  Food Insecurity: No Food Insecurity   Worried About RCharity fundraiserin the Last Year: Never true   RBiddlein the Last Year: Never true  Transportation Needs: No Transportation Needs   Lack of Transportation (Medical): No   Lack of Transportation (Non-Medical): No  Physical Activity: Insufficiently Active   Days of Exercise per Week: 1 day   Minutes of Exercise per Session: 10 min  Stress: No Stress Concern Present   Feeling of Stress : Only a little  Social Connections: Moderately Integrated   Frequency of Communication with Friends and Family: Twice a week   Frequency of Social Gatherings with Friends and Family: Twice a week   Attends Religious Services: 1 to 4 times per year   Active Member of [employer] Organizations: No   Attends CEnglish as a second language teacherMeetings: Never   Marital Status: Married   Vitals:   05/02/21 1221  BP: (!) 148/90  Pulse: 81  Resp: 16  SpO2: 98%   Body mass index is 28.21 kg/m.  Physical Exam Vitals and nursing note reviewed.  Constitutional:      General: He is not in acute distress.    Appearance: He is well-developed.  HENT:     Head: Normocephalic and atraumatic.     Mouth/Throat:     Mouth: Mucous membranes are moist.  Eyes:     Conjunctiva/sclera: Conjunctivae normal.  Neck:     Vascular: No JVD.  Cardiovascular:     Rate and Rhythm: Normal rate and regular rhythm.     Pulses:          Dorsalis pedis pulses are 2+ on the right side and 2+ on the left side.     Heart sounds: Murmur (Dyastolic I-II/VI RUSB, SEM I/VI LUSB) heard.     Comments: Trace pitting LE edema,bilateral. Pulmonary:     Effort: Pulmonary effort is normal. No respiratory distress.     Breath sounds: Normal breath sounds.  Abdominal:     Palpations: Abdomen is soft. There is no hepatomegaly or mass.     Tenderness: There is no abdominal tenderness.  Musculoskeletal:     Right lower leg: Edema present.     Left lower leg: Edema present.  Skin:    General: Skin is warm.     Findings: No erythema or rash.  Neurological:     Mental Status: He is alert and oriented to person, place, and time.     Cranial Nerves: No cranial nerve deficit.     Gait: Gait normal.     Comments: Antalgia/unstable gait assisted by a cane.  Psychiatric:     Comments: Well groomed, good eye contact.   ASSESSMENT AND PLAN:   Mr.Kin was seen today for hypertension.  Diagnoses and all orders for this visit:  Headache, unspecified headache type We discussed possible etiologies, some of his chronic medical problems and medications can be contributing factors, OSA and Imdur. ? Tension headache. I do not think we need imaging at this time. Monitor for new  symptoms. Headache diary to start today, instructed to let me know if it  gets worse after increasing dose of Imdur.  Hypertension with heart disease BP re-checked : 170/80 x 2. We discussed BP goals, given his history of CAD < 140/90 is recommended. Increasing metoprolol will aggravate bradycardia. HCTZ will increase urinary frequency. He agrees with increasing Imdur from 60 mg to 90 mg daily, 1.5 tablet. We discussed some side effects of medications. Low-salt diet recommended. Continue monitoring BP regularly. Follow-up in 4 to 5 weeks.  Bilateral lower extremity edema Problem has improved. Continue furosemide 40 mg daily as needed.  Hyperlipidemia, mixed He has not tolerated statins, exacerbated muscle cramps. Recommend trying rosuvastatin 10 mg 2-3 times per week.  Coronary artery disease of native artery of native heart with stable angina pectoris ([place]) CP has improved. He is not interested in further work-up or following with cardiologist. He has not tolerated stains well, he agrees with trying Rosuvastatin a couple times per week. Because elevated BP,Imdur increased from 60 to 90 mg, we discussed some sdie effects. Instructed about warning signs.   I spent a total of 43 minutes in both face to face and non face to face activities for this visit on the date of this encounter. During this time history was obtained and documented, examination was performed, prior labs/imaging reviewed, and assessment/plan discussed.  Return in about 5 weeks (around 06/06/2021).  Sherelle Castelli G. [name], MD  [facility]. [place] office.

## 2021-05-02 NOTE — Assessment & Plan Note (Signed)
BP re-checked : 170/80 x 2. We discussed BP goals, given his history of CAD < 140/90 is recommended. Increasing metoprolol will aggravate bradycardia. HCTZ will increase urinary frequency. He agrees with increasing Imdur from 60 mg to 90 mg daily, 1.5 tablet. We discussed some side effects of medications. Low-salt diet recommended. Continue monitoring BP regularly. Follow-up in 4 to 5 weeks.

## 2021-05-02 NOTE — Assessment & Plan Note (Signed)
Problem has improved. Continue furosemide 40 mg daily as needed.

## 2021-05-02 NOTE — Patient Instructions (Addendum)
A few things to remember from today's visit:  No diagnosis found.  If you need refills please call your pharmacy. Do not use My Chart to request refills or for acute issues that need immediate attention.   Try to decrease salt intake.  Imdur increased from 1 tab to 1 1/2 tab. Imdur can cause headaches, monitor  Please be sure medication list is accurate. Keep a headache diary with intensity of headache. Continue furosemide daily as needed. Try Crestor 2-3 times per week.  If a new problem present, please set up appointment sooner than planned today.

## 2021-05-02 NOTE — Assessment & Plan Note (Signed)
He has not tolerated statins, exacerbated muscle cramps. Recommend trying rosuvastatin 10 mg 2-3 times per week.

## 2021-06-03 ENCOUNTER — Other Ambulatory Visit: Payer: Self-pay | Admitting: Family Medicine

## 2021-06-03 DIAGNOSIS — I119 Hypertensive heart disease without heart failure: Secondary | ICD-10-CM

## 2021-06-03 DIAGNOSIS — I25118 Atherosclerotic heart disease of native coronary artery with other forms of angina pectoris: Secondary | ICD-10-CM

## 2021-06-06 ENCOUNTER — Encounter: Payer: Self-pay | Admitting: Family Medicine

## 2021-06-06 ENCOUNTER — Ambulatory Visit (INDEPENDENT_AMBULATORY_CARE_PROVIDER_SITE_OTHER): Payer: Medicare HMO | Admitting: Family Medicine

## 2021-06-06 VITALS — BP 120/60 | HR 45 | Resp 16 | Ht 63.0 in | Wt 159.1 lb

## 2021-06-06 DIAGNOSIS — R001 Bradycardia, unspecified: Secondary | ICD-10-CM | POA: Diagnosis not present

## 2021-06-06 DIAGNOSIS — R519 Headache, unspecified: Secondary | ICD-10-CM

## 2021-06-06 DIAGNOSIS — I119 Hypertensive heart disease without heart failure: Secondary | ICD-10-CM

## 2021-06-06 MED ORDER — VALSARTAN 40 MG PO TABS: 40.0000 mg | ORAL_TABLET | Freq: Every day | ORAL | 0 refills | Status: DC

## 2021-06-06 NOTE — Progress Notes (Signed)
HPI: [name] is a 88 y.o. male with hx of DM II,CAD,OSA did not tolerate CPAP,generalized OA,HLD,and HFpEF here today with his wife to follow on recent visit. I last saw him on 05/02/21 when he was c/o headache, he has had some episodes since last visit. He is not sure of worse after increasing dose of Imdur. Described as mild, no associated symptoms, and acetaminophen helps. Parieto-frontal pressure like headache.  He has had some elevated BP's, SBP's ranging from 115-164, DBP 70-80's. Negative for CP,SOB,palpitations,orthopnea,PND,or worsening LE edema. He is on Imdur 90 mg daily and Metoprolol succinate 25 mg daily, the latter one he was supposed to be on 1/2 tab, was decreased a few months ago and planning on wean off because sinus bradycardia.  Lab Results  Component Value Date   CREATININE 0.65 12/23/2020   BUN 16 12/23/2020   NA 140 12/23/2020   K 3.9 12/23/2020   CL 105 12/23/2020   CO2 26 12/23/2020   Review of Systems  Constitutional:  Positive for fatigue. Negative for activity change, appetite change and fever.  Eyes:  Negative for redness and visual disturbance.  Respiratory:  Negative for cough and wheezing.   Gastrointestinal:  Negative for abdominal pain, nausea and vomiting.  Genitourinary:  Negative for decreased urine volume and hematuria.  Musculoskeletal:  Positive for arthralgias and gait problem.  Skin:  Negative for rash.  Neurological:  Negative for syncope, facial asymmetry and weakness.  Rest see pertinent positives and negatives per HPI.  Current Outpatient Medications on File Prior to Visit  Medication Sig Dispense Refill   acetaminophen (TYLENOL) 500 MG tablet Take 500 mg by mouth every 6 (six) hours as needed.     albuterol (VENTOLIN HFA) 108 (90 Base) MCG/ACT inhaler Inhale 2 puffs into the lungs every 6 (six) hours as needed. (Patient taking differently: Inhale 2 puffs into the lungs every 6 (six) hours as needed for wheezing or shortness of  breath.) 18 g 3   Blood Glucose Monitoring Suppl (ACCU-CHEK AVIVA CONNECT) w/Device KIT 1 Device by Does not apply route daily. 1 kit 0   Cyanocobalamin (B-12) 1000 MCG TABS Take 1,000 mcg by mouth daily.     finasteride (PROSCAR) 5 MG tablet TAKE 1 TABLET BY MOUTH EVERY DAY 90 tablet 2   fluticasone (FLONASE) 50 MCG/ACT nasal spray PLACE 1 SPRAY INTO BOTH NOSTRILS 2 (TWO) TIMES DAILY 16 mL 2   furosemide (LASIX) 40 MG tablet TAKE 1 TABLET BY MOUTH TWICE A DAY 180 tablet 1   glipiZIDE (GLUCOTROL) 5 MG tablet Take 1 tablet (5 mg total) by mouth daily before breakfast. 20-30 minutes before meal. 90 tablet 1   isosorbide mononitrate (IMDUR) 60 MG 24 hr tablet TAKE 1 TABLET BY MOUTH EVERY DAY 90 tablet 1   Lancet Devices (LANCET DEVICE WITH EJECTOR) MISC 1 Device by Does not apply route daily. 1 each 1   loperamide (IMODIUM A-D) 2 MG tablet Take 2 mg by mouth 4 (four) times daily as needed for diarrhea or loose stools.     metoprolol tartrate (LOPRESSOR) 25 MG tablet TAKE 1 TABLET BY MOUTH EVERY DAY 90 tablet 2   nitroGLYCERIN (NITROSTAT) 0.4 MG SL tablet Place 1 tablet (0.4 mg total) under the tongue every 5 (five) minutes as needed for chest pain. 50 tablet 1   OneTouch Delica Lancets 16X MISC USE TO CHECK BLOOD SUGAR DAILY AND PRN 100 each 4   ONETOUCH ULTRA test strip USE TO CHECK BLOOD SUGAR DAILY  E11.9 100 strip 4   rOPINIRole (REQUIP) 0.5 MG tablet TAKE 1 TABLET BY MOUTH EVERYDAY AT BEDTIME 90 tablet 2   rosuvastatin (CRESTOR) 10 MG tablet TAKE 1 TABLET BY MOUTH EVERY DAY 90 tablet 1   tamsulosin (FLOMAX) 0.4 MG CAPS capsule TAKE 1 CAPSULE BY MOUTH EVERY DAY AFTER SUPPER 90 capsule 2   tizanidine (ZANAFLEX) 2 MG capsule TAKE 4 CAPSULES (8 MG TOTAL) BY MOUTH AT BEDTIME AS NEEDED FOR MUSCLE SPASMS. 360 capsule 1   Current Facility-Administered Medications on File Prior to Visit  Medication Dose Route Frequency Provider Last Rate Last Admin   triamcinolone acetonide (KENALOG-40) injection 40 mg   40 mg Intramuscular Once [name], Laura Caldas G, MD        Past Medical History:  Diagnosis Date   BACK PAIN, CHRONIC 04/18/2009   BENIGN PROSTATIC HYPERTROPHY, HX OF 01/19/2007   Carpal tunnel syndrome 03/24/2007   CORONARY ARTERY DISEASE 01/19/2007   DEPRESSION, CHRONIC 06/24/2007   DIABETES MELLITUS 01/19/2007   DIABETES MELLITUS, TYPE II, WITH NEUROLOGICAL COMPLICATIONS 05/14/1153   Diarrhea 02/23/2007   HEART MURMUR, SYSTOLIC 05/21/221   HERPES SIMPLEX INFECTION 01/19/2007   HYPERLIPIDEMIA 01/19/2007   HYPERTENSION 01/19/2007   Intermediate coronary syndrome ([place]) 08/29/2008   LEG CRAMPS, NOCTURNAL 10/28/2007   Leg cramps, sleep related 08/05/2010   OBSTRUCTIVE SLEEP APNEA 01/19/2007   does not use CPAP   OSTEOARTHRITIS 06/24/2007   Other postprocedural status(V45.89) 01/19/2007   PERCUTANEOUS TRANSLUMINAL CORONARY ANGIOPLASTY, HX OF 01/19/2007   PLANTAR FASCIITIS, RIGHT 01/19/2007   SHOULDER PAIN, LEFT 03/24/2007   SPINAL STENOSIS, LUMBAR 06/03/2009   TINEA PEDIS 11/15/2009   URI 01/11/2009   No Known Allergies  Social History   Socioeconomic History   Marital status: Married    Spouse name: Not on file   Number of children: Not on file   Years of education: Not on file   Highest education level: Not on file  Occupational History   Occupation: retired    Comment: Truck Research officer, political party  Tobacco Use   Smoking status: Former    Types: Cigarettes    Quit date: 04/14/1983    Years since quitting: 38.1   Smokeless tobacco: Never   Tobacco comments:    stopped 1985  Vaping Use   Vaping Use: Never used  Substance and Sexual Activity   Alcohol use: Yes    Comment: OCCASSIONAL   Drug use: Not Currently    Types: Marijuana    Comment: occ   Sexual activity: Not on file  Other Topics Concern   Not on file  Social History Narrative   Married in [place] - 67yr, divorced; married 1974 - 1.5 years, divorced; married 1985 1 daughter 121 [id number] daughters; 2 grandchildren.   Social Determinants  of Health   Financial Resource Strain: Low Risk    Difficulty of Paying Living Expenses: Not hard at all  Food Insecurity: No Food Insecurity   Worried About RCharity fundraiserin the Last Year: Never true   [place] the Last Year: Never true  Transportation Needs: No Transportation Needs   Lack of Transportation (Medical): No   Lack of Transportation (Non-Medical): No  Physical Activity: Insufficiently Active   Days of Exercise per Week: 1 day   Minutes of Exercise per Session: 10 min  Stress: No Stress Concern Present   Feeling of Stress : Only a little  Social Connections: Moderately Integrated   Frequency of Communication with Friends and Family:  Twice a week   Frequency of Social Gatherings with Friends and Family: Twice a week   Attends Religious Services: 1 to 4 times per year   Active Member of [employer] or Organizations: No   Attends Archivist Meetings: Never   Marital Status: Married   Vitals:   06/06/21 1159  BP: 120/60  Pulse: (!) 45  Resp: 16  SpO2: 98%   Body mass index is 28.19 kg/m.  Physical Exam Vitals and nursing note reviewed.  Constitutional:      General: He is not in acute distress.    Appearance: He is well-developed.  HENT:     Head: Normocephalic and atraumatic.  Eyes:     Conjunctiva/sclera: Conjunctivae normal.  Cardiovascular:     Rate and Rhythm: Regular rhythm. Bradycardia present.     Heart sounds: Murmur (Dyastolic I-II/VI RUSB, SEM I/VI LUSB) heard.  Pulmonary:     Effort: Pulmonary effort is normal. No respiratory distress.     Breath sounds: Normal breath sounds.  Abdominal:     Palpations: Abdomen is soft.     Tenderness: There is no abdominal tenderness.  Musculoskeletal:     Right lower leg: 1+ Pitting Edema present.     Left lower leg: 1+ Pitting Edema present.  Lymphadenopathy:     Cervical: No cervical adenopathy.  Skin:    General: Skin is warm.     Findings: No erythema or rash.  Neurological:      Mental Status: He is alert and oriented to person, place, and time.     Comments: Antalgic,unstable gate assisted by a cane.  Psychiatric:     Comments: Well groomed, good eye contact.   ASSESSMENT AND PLAN:  Mr.Kc was seen today for follow-up.  Diagnoses and all orders for this visit:  Hypertension with heart disease Today BP is adequate, he is having some elevated SBP's at home. Metoprolol to be weaned off due to bradycardia. Imdur decreased from 90 mg to 30 mg daily. Valsartan 40 mg added today. He has been on this medication in the past and well tolerated. Continue monitoring BP regularly.  -     valsartan (DIOVAN) 40 MG tablet; Take 1 tablet (40 mg total) by mouth daily.  Sinus bradycardia This is a not a new problem, he has been asymptomatic. Metoprolol succinate to be weaned off. Continue monitoring HR at home.  Headache, unspecified headache type Hx and examination do not suggest a serious process. I do not think imaging is needed at this time. Imdur could be causing or aggravating problem so dose was decreased. Continue monitoring for new associated symptoms or worsening pain. Instructed about warning signs.  Return in about 2 months (around 08/04/2021).  Kyian Obst G. [name], MD  [facility]. [place] office.

## 2021-06-06 NOTE — Patient Instructions (Addendum)
A few things to remember from today's visit:  Sinus bradycardia  Hypertension with heart disease - Plan: valsartan (DIOVAN) 40 MG tablet  If you need refills please call your pharmacy. Do not use My Chart to request refills or for acute issues that need immediate attention.   Imdur can be causing headaches, so we are going from 90 mg to 30 mg. Because low heart rate is low, we are going to wean off Metoprolol. Decreased to 1/2 tab daily for 10 days and every other day for a week and stop.  We are adding Valsartan 40 mg daily.  Please be sure medication list is accurate. If a new problem present, please set up appointment sooner than planned today.

## 2021-06-18 ENCOUNTER — Encounter: Payer: Self-pay | Admitting: Family Medicine

## 2021-06-20 ENCOUNTER — Other Ambulatory Visit: Payer: Self-pay | Admitting: Family Medicine

## 2021-06-20 DIAGNOSIS — I25118 Atherosclerotic heart disease of native coronary artery with other forms of angina pectoris: Secondary | ICD-10-CM

## 2021-06-20 DIAGNOSIS — I119 Hypertensive heart disease without heart failure: Secondary | ICD-10-CM

## 2021-06-20 MED ORDER — ISOSORBIDE MONONITRATE ER 60 MG PO TB24: 30.0000 mg | ORAL_TABLET | Freq: Every day | ORAL | 1 refills | Status: DC

## 2021-06-30 ENCOUNTER — Encounter: Payer: Self-pay | Admitting: Family Medicine

## 2021-07-08 ENCOUNTER — Encounter: Payer: Self-pay | Admitting: Family Medicine

## 2021-07-08 ENCOUNTER — Telehealth (INDEPENDENT_AMBULATORY_CARE_PROVIDER_SITE_OTHER): Payer: Medicare HMO | Admitting: Family Medicine

## 2021-07-08 VITALS — Ht 63.0 in

## 2021-07-08 DIAGNOSIS — J069 Acute upper respiratory infection, unspecified: Secondary | ICD-10-CM

## 2021-07-08 DIAGNOSIS — R059 Cough, unspecified: Secondary | ICD-10-CM

## 2021-07-08 DIAGNOSIS — R079 Chest pain, unspecified: Secondary | ICD-10-CM

## 2021-07-08 MED ORDER — DOXYCYCLINE HYCLATE 100 MG PO TABS: 100.0000 mg | ORAL_TABLET | Freq: Two times a day (BID) | ORAL | 0 refills | Status: AC

## 2021-07-08 MED ORDER — HYDROCODONE BIT-HOMATROP MBR 5-1.5 MG/5ML PO SOLN: 5.0000 mL | Freq: Two times a day (BID) | ORAL | 0 refills | Status: AC | PRN

## 2021-07-08 MED ORDER — BENZONATATE 100 MG PO CAPS: 200.0000 mg | ORAL_CAPSULE | Freq: Two times a day (BID) | ORAL | 0 refills | Status: AC | PRN

## 2021-07-08 NOTE — Progress Notes (Signed)
Virtual Visit via Video Note ?I connected with [name] on 07/08/21 by a video enabled telemedicine application and verified that I am speaking with the correct person using two identifiers. ? Location patient: home ?Location provider:work office ?Persons participating in the virtual visit: patient, wife, provider ? ?I discussed the limitations of evaluation and management by telemedicine and the availability of in person appointments. The patient expressed understanding and agreed to proceed. ? ?Chief Complaint  ?Patient presents with  ? URI  ?  X 3-4 days.  ? ?HPI: ?[name] is a 88 yo male with hx of OSA,DM II,HTN,CAD,HLD, and generalized OA c/o respiratory symptoms. ?Fatigue,decreased appetite, sinus pressure,chills,and body aches. ?Nasal congestion worse when lying down. ? ?URI  ?This is a new problem. The current episode started in the past 7 days. There has been no fever. Associated symptoms include chest pain, congestion, coughing, headaches, joint pain, nausea, rhinorrhea and a sore throat. Pertinent negatives include no abdominal pain, ear pain, plugged ear sensation, rash, swollen glands, vomiting or wheezing. He has tried antihistamine for the symptoms. The treatment provided mild relief.  ?Temp 99.1 F. ?Productive cough with greenish sputum, negative for hemoptysis. ? ?His wife has noted some SOB. ?Today morning he had mild retrosternal chest discomfort and mild nausea, resolved. Pain was not radiated, not exertional,lasted a few minutes,no associated diaphoresis or palpitations. ? ?Negative for anosmia,ageusia,changes in bowel habits (hx of chronic diarrhea), urinary symptoms, or vomiting. ?His wife has same symptoms. They both had a negative COVID 19 test at home 2 days ago. ?He tried Nyquil. ? ?ROS: See pertinent positives and negatives per HPI. ? ?Past Medical History:  ?Diagnosis Date  ? BACK PAIN, CHRONIC 04/18/2009  ? BENIGN PROSTATIC HYPERTROPHY, HX OF 01/19/2007  ? Carpal tunnel syndrome  03/24/2007  ? CORONARY ARTERY DISEASE 01/19/2007  ? DEPRESSION, CHRONIC 06/24/2007  ? DIABETES MELLITUS 01/19/2007  ? DIABETES MELLITUS, TYPE II, WITH NEUROLOGICAL COMPLICATIONS 08/12/7614  ? Diarrhea 02/23/2007  ? HEART MURMUR, SYSTOLIC 0/73/7106  ? HERPES SIMPLEX INFECTION 01/19/2007  ? HYPERLIPIDEMIA 01/19/2007  ? HYPERTENSION 01/19/2007  ? Intermediate coronary syndrome ([place]) 08/29/2008  ? LEG CRAMPS, NOCTURNAL 10/28/2007  ? Leg cramps, sleep related 08/05/2010  ? OBSTRUCTIVE SLEEP APNEA 01/19/2007  ? does not use CPAP  ? OSTEOARTHRITIS 06/24/2007  ? Other postprocedural status(V45.89) 01/19/2007  ? PERCUTANEOUS TRANSLUMINAL CORONARY ANGIOPLASTY, HX OF 01/19/2007  ? PLANTAR FASCIITIS, RIGHT 01/19/2007  ? SHOULDER PAIN, LEFT 03/24/2007  ? SPINAL STENOSIS, LUMBAR 06/03/2009  ? TINEA PEDIS 11/15/2009  ? URI 01/11/2009  ? ? ?Past Surgical History:  ?Procedure Laterality Date  ? arthroscopy, knee of hx    ? CARDIAC CATHETERIZATION    ? CATARACT EXTRACTION Left   ? COLONOSCOPY    ? inguinal herniorrhapy, hx    ? NASAL SINUS SURGERY    ? percutaneous transluminal coronary angioplasty, hx of  2004  ? Dr. Lyndel Safe  ? plastic joint in thumb    ? PTCA/stent  2004  ? TONSILLECTOMY    ? VASECTOMY    ? ?Family History  ?Problem Relation Age of Onset  ? Arthritis Sister   ? Diabetes Other   ? Coronary artery disease Other   ? ?Social History  ? ?Socioeconomic History  ? Marital status: Married  ?  Spouse name: Not on file  ? Number of children: Not on file  ? Years of education: Not on file  ? Highest education level: Not on file  ?Occupational History  ? Occupation: retired  ?  Comment: Truck driver [employer]  ?Tobacco Use  ? Smoking status: Former  ?  Types: Cigarettes  ?  Quit date: 04/14/1983  ?  Years since quitting: 38.2  ? Smokeless tobacco: Never  ? Tobacco comments:  ?  stopped 1985  ?Vaping Use  ? Vaping Use: Never used  ?Substance and Sexual Activity  ? Alcohol use: Yes  ?  Comment: OCCASSIONAL  ? Drug use: Not Currently  ?  Types: Marijuana   ?  Comment: occ  ? Sexual activity: Not on file  ?Other Topics Concern  ? Not on file  ?Social History Narrative  ? Married in 1959 - 33yr, divorced; married 1974 - 1.5 years, divorced; married 1985 1 daughter 121 189step daughters; 2 grandchildren.  ? ?Social Determinants of Health  ? ?Financial Resource Strain: Low Risk   ? Difficulty of Paying Living Expenses: Not hard at all  ?Food Insecurity: No Food Insecurity  ? Worried About RCharity fundraiserin the Last Year: Never true  ? Ran Out of Food in the Last Year: Never true  ?Transportation Needs: No Transportation Needs  ? Lack of Transportation (Medical): No  ? Lack of Transportation (Non-Medical): No  ?Physical Activity: Insufficiently Active  ? Days of Exercise per Week: 1 day  ? Minutes of Exercise per Session: 10 min  ?Stress: No Stress Concern Present  ? Feeling of Stress : Only a little  ?Social Connections: Moderately Integrated  ? Frequency of Communication with Friends and Family: Twice a week  ? Frequency of Social Gatherings with Friends and Family: Twice a week  ? Attends Religious Services: 1 to 4 times per year  ? Active Member of Clubs or Organizations: No  ? Attends CArchivistMeetings: Never  ? Marital Status: Married  ?Intimate Partner Violence: Not At Risk  ? Fear of Current or Ex-Partner: No  ? Emotionally Abused: No  ? Physically Abused: No  ? Sexually Abused: No  ? ?Current Outpatient Medications:  ?  acetaminophen (TYLENOL) 500 MG tablet, Take 500 mg by mouth every 6 (six) hours as needed., Disp: , Rfl:  ?  albuterol (VENTOLIN HFA) 108 (90 Base) MCG/ACT inhaler, Inhale 2 puffs into the lungs every 6 (six) hours as needed. (Patient taking differently: Inhale 2 puffs into the lungs every 6 (six) hours as needed for wheezing or shortness of breath.), Disp: 18 g, Rfl: 3 ?  Blood Glucose Monitoring Suppl (ACCU-CHEK AVIVA CONNECT) w/Device KIT, 1 Device by Does not apply route daily., Disp: 1 kit, Rfl: 0 ?  Cyanocobalamin  (B-12) 1000 MCG TABS, Take 1,000 mcg by mouth daily., Disp: , Rfl:  ?  finasteride (PROSCAR) 5 MG tablet, TAKE 1 TABLET BY MOUTH EVERY DAY, Disp: 90 tablet, Rfl: 2 ?  fluticasone (FLONASE) 50 MCG/ACT nasal spray, PLACE 1 SPRAY INTO BOTH NOSTRILS 2 (TWO) TIMES DAILY, Disp: 16 mL, Rfl: 2 ?  furosemide (LASIX) 40 MG tablet, TAKE 1 TABLET BY MOUTH TWICE A DAY, Disp: 180 tablet, Rfl: 1 ?  glipiZIDE (GLUCOTROL) 5 MG tablet, Take 1 tablet (5 mg total) by mouth daily before breakfast. 20-30 minutes before meal., Disp: 90 tablet, Rfl: 1 ?  isosorbide mononitrate (IMDUR) 60 MG 24 hr tablet, Take 0.5 tablets (30 mg total) by mouth daily., Disp: 90 tablet, Rfl: 1 ?  Lancet Devices (LANCET DEVICE WITH EJECTOR) MISC, 1 Device by Does not apply route daily., Disp: 1 each, Rfl: 1 ?  loperamide (IMODIUM A-D) 2 MG tablet, Take 2  mg by mouth 4 (four) times daily as needed for diarrhea or loose stools., Disp: , Rfl:  ?  nitroGLYCERIN (NITROSTAT) 0.4 MG SL tablet, Place 1 tablet (0.4 mg total) under the tongue every 5 (five) minutes as needed for chest pain., Disp: 50 tablet, Rfl: 1 ?  OneTouch Delica Lancets 84Z MISC, USE TO CHECK BLOOD SUGAR DAILY AND PRN, Disp: 100 each, Rfl: 4 ?  ONETOUCH ULTRA test strip, USE TO CHECK BLOOD SUGAR DAILY E11.9, Disp: 100 strip, Rfl: 4 ?  rOPINIRole (REQUIP) 0.5 MG tablet, TAKE 1 TABLET BY MOUTH EVERYDAY AT BEDTIME, Disp: 90 tablet, Rfl: 2 ?  rosuvastatin (CRESTOR) 10 MG tablet, TAKE 1 TABLET BY MOUTH EVERY DAY, Disp: 90 tablet, Rfl: 1 ?  tamsulosin (FLOMAX) 0.4 MG CAPS capsule, TAKE 1 CAPSULE BY MOUTH EVERY DAY AFTER SUPPER, Disp: 90 capsule, Rfl: 2 ?  tizanidine (ZANAFLEX) 2 MG capsule, TAKE 4 CAPSULES (8 MG TOTAL) BY MOUTH AT BEDTIME AS NEEDED FOR MUSCLE SPASMS., Disp: 360 capsule, Rfl: 1 ?  valsartan (DIOVAN) 40 MG tablet, Take 1 tablet (40 mg total) by mouth daily., Disp: 90 tablet, Rfl: 0 ? ?Current Facility-Administered Medications:  ?  triamcinolone acetonide (KENALOG-40) injection 40 mg, 40  mg, Intramuscular, Once, [name], Barbette Mcglaun G, MD ? ?EXAM: ? ?VITALS per patient if applicable:Ht <[id number]>_5<\/[id number]>_2  (1.6 m)   BMI 28.19 kg/m?  ? ?GENERAL: alert, oriented, appears well and in no acute distress ? ?HEENT: atraumati

## 2021-07-25 ENCOUNTER — Telehealth: Payer: Self-pay | Admitting: *Deleted

## 2021-07-25 NOTE — Telephone Encounter (Signed)
? ?  Pre-operative Risk Assessment  ?  ?Patient Name: [name]  ?DOB: January 09, 1932 ?MRN: [id number]  ? ?  ? ?Request for Surgical Clearance   ? ?Procedure:   SURGICAL EXTRACTION OF ONLY ONE TOOTH (TOOTH # 17 IN THE MOUTH) ? ?Date of Surgery:  Clearance TBD                              ?   ?Surgeon:  DR. Arnetha Courser, DDS ?Surgeon's Group or Practice Name:  [name] ?Phone number:  [phone] ?Fax number:  [phone] ?  ?Type of Clearance Requested:   ?- Medical  ?  ?Type of Anesthesia:  Local  WITH EPINEPHRINE ?  ?Additional requests/questions:   PER THE CLEARANCE FORM STATES THE PT HAS C/O CHEST PAIN AND THAT HE HAS TAKEN NTG IN THE PAST MONTH; DUE TO THIS, HE WILL NEED A STRESS TEST IN ADDITION TO CLEARANCE ? ?Signed, ?Julaine Hua   ?07/25/2021, 4:10 PM  ? ?

## 2021-07-26 ENCOUNTER — Encounter: Payer: Self-pay | Admitting: Family Medicine

## 2021-07-28 MED ORDER — VALSARTAN 80 MG PO TABS: 80.0000 mg | ORAL_TABLET | Freq: Every day | ORAL | 3 refills | Status: DC

## 2021-07-28 NOTE — Telephone Encounter (Signed)
Spoke with patient who states he wants to think about it and he will contact us when he is reasy to schedule a follow up appointment.  ?

## 2021-07-28 NOTE — Telephone Encounter (Signed)
? ?  Patient Name: [name]  ?DOB: November 24, 1931 ?MRN: [id number] ? ?Primary Cardiologist: Dr. Bettina Gavia ? ?Chart reviewed as part of pre-operative protocol coverage.  ?Though single dental extraction is typically low risk, the patient was last seen 11/2020 by Dr. Bettina Gavia with known history of CAD complaining of chest pain. Nuclear stress test and echo were ordered at that time but never completed by the patient. Phone note from 12/2020 indicated patient refused. He did not return for recommended 4 week follow-up either. ? ?Will route to MD for input whether patient may proceed with dental extraction as requested under local with epinephrine or if he needs to come in for updated visit first. Dr. Bettina Gavia - Please route response to P CV DIV PREOP (the pre-op pool). Thank you. ? ?Charlie Pitter, PA-C ?07/28/2021, 11:07 AM ? ? ? ?

## 2021-07-28 NOTE — Telephone Encounter (Signed)
? ?  Patient Name: [name]  ?DOB: May 25, 1931 ?MRN: [id number] ? ?Primary Cardiologist: Shirlee More, MD ? ?Chart reviewed as part of pre-operative protocol coverage.  ? ?As noted below, patient was last seen 11/2020 by Dr. Bettina Gavia with known history of CAD complaining of chest pain. Nuclear stress test and echo were ordered at that time but never completed by the patient. Phone note from 12/2020 indicated patient refused. He did not return for recommended 4 week follow-up either. I reached out to Dr. Bettina Gavia since tooth extraction is typically a low risk procedure that does not require specific cardiac clearance and we do not typically advise holding any blood thinners for a single tooth extraction. ? ?Per Dr. Bettina Gavia, "I think there are 2 separate interrelated issues ?First visit dental extraction very low risk procedure I think he can have it done but I do think I would advise no epinephrine  ?The second is noncompliance he should be encouraged to come back and be seen in the office.  ?If he does not  make an appointment I would like to terminate the relationship." ? ?I do not see any indication for SBE prophylaxis from a cardiac standpoint based on known history. ? ?Will route this message to callback to contact patient to schedule a follow-up appointment with Dr. Joya Gaskins team per his recommendation.  ? ?Otherwise Dr. Bettina Gavia has granted permission for patient to proceed with his full recommendations outlined above so will route this bundled recommendation to requesting provider via Epic fax function. Please call with questions. ? ?Charlie Pitter, PA-C ?07/28/2021, 1:01 PM ? ? ?

## 2021-08-12 NOTE — Progress Notes (Signed)
? ?HPI: ?[name] is a 88 y.o. male, who is here today with his wife for chronic disease management. ?He was last seen on 07/08/21, acute virtual visit. ?Respiratory symptoms have resolved. ?He has not had new problems since his last visit. ? ?He ahs seen dentist, needs a wisdom tooth extracted but his dentist requested a stress test before he can have dental procedure done. He has not had significant pain, completed abx treatment. ?He has seen cardiologist and declined cardiac work-up that included stress test and echo. ?CAD s/p stent placements. ?He has not had chest pain,SOB,palpitations,orthopnea,PND,or worsening LE edema. ?LE edema is worse at the end of the day and with prolonged standing. ? ?He takes Furosemide 40 mg bid, skips it when he needs to run errands because aggravates urinary frequency and urgency. ?Metoprolol was discontinued because bradycardia. ?He is on Valsartan 80 mg daily and Imdur 60 mg 1/2 tab daily. ?Headaches are occasional and "not bad" after decreasing dose of Imdur. ? ?One episode of visual changes, felt like he was crossing his eyes, happened about 2 weeks ago, lasted a few seconds while he was working on the computer. Negative for hemianopia,diplopia,or blindness.Vision is back to his baseline. ?He has not seen eye care provider in a few years. ? ?He takes Flomax 0.4 mg and Proscar 5 mg daily, still getting up a few times at night to urinate. He felt like he was doing better when he was also taking Doxazosin 8 mg, for a few weeks he took it along with Flomax. He is asking if this medication can be resumed. ? ?Lab Results  ?Component Value Date  ? CREATININE 0.65 12/23/2020  ? BUN 16 12/23/2020  ? NA 140 12/23/2020  ? K 3.9 12/23/2020  ? CL 105 12/23/2020  ? CO2 26 12/23/2020  ? ?DM II: He is on Glipizide 5 mg daily. ?Skipping meals,mainly breakfast, and eating late at night. ?HgA1C 12/23/20 was 7.0. ?Not checking BS's as regular as he has in the past. ?Negative for  polydipsia,polyuria, or polyphagia. ? ?Lab Results  ?Component Value Date  ? MICROALBUR 5.3 (H) 04/26/2020  ? MICROALBUR 2.3 (H) 01/30/2019  ? ?Peripheral neuropathy, feet numbness.  ?His wife is concerned about skin pigmentation changes on distal LE's. ?No associated pain or erythema. ? ?A week ago had sudden muscle cramp that started in left foot and  did spread up to buttock also hand cramps. ?Long hx of cramps, he has seen neuro and was on Trileptal. ?Requip daily at bedtime and Zanaflex 2 mg tid have helped. Sometimes  he takes more than prescribed. ? ?His wife reports that he drinks moderate amount of amount of alcohol at night, 1-2 glasses of wine. ? ?He stopped Crestor, aggravated arthralgias and myalgias. ?Lab Results  ?Component Value Date  ? CHOL 168 12/23/2020  ? HDL 48.60 12/23/2020  ? [place] 93 12/23/2020  ? LDLDIRECT 121.0 04/26/2020  ? TRIG 132.0 12/23/2020  ? CHOLHDL 3 12/23/2020  ? ? ?Review of Systems  ?Constitutional:  Negative for activity change and fever.  ?HENT:  Negative for nosebleeds, sore throat and trouble swallowing.   ?Respiratory:  Negative for cough and wheezing.   ?Gastrointestinal:  Positive for nausea (Occasionally). Negative for abdominal pain and vomiting.  ?Genitourinary:  Negative for decreased urine volume and hematuria.  ?Musculoskeletal:  Positive for arthralgias and gait problem.  ?Neurological:  Negative for syncope, facial asymmetry and weakness.  ?Psychiatric/Behavioral:  Negative for confusion and hallucinations.   ?Rest of ROS see  pertinent positives and negatives in HPI. ? ?Current Outpatient Medications on File Prior to Visit  ?Medication Sig Dispense Refill  ? acetaminophen (TYLENOL) 500 MG tablet Take 500 mg by mouth every 6 (six) hours as needed.    ? albuterol (VENTOLIN HFA) 108 (90 Base) MCG/ACT inhaler Inhale 2 puffs into the lungs every 6 (six) hours as needed. (Patient taking differently: Inhale 2 puffs into the lungs every 6 (six) hours as needed for  wheezing or shortness of breath.) 18 g 3  ? Blood Glucose Monitoring Suppl (ACCU-CHEK AVIVA CONNECT) w/Device KIT 1 Device by Does not apply route daily. 1 kit 0  ? Cyanocobalamin (B-12) 1000 MCG TABS Take 1,000 mcg by mouth daily.    ? finasteride (PROSCAR) 5 MG tablet TAKE 1 TABLET BY MOUTH EVERY DAY 90 tablet 2  ? fluticasone (FLONASE) 50 MCG/ACT nasal spray PLACE 1 SPRAY INTO BOTH NOSTRILS 2 (TWO) TIMES DAILY 16 mL 2  ? furosemide (LASIX) 40 MG tablet TAKE 1 TABLET BY MOUTH TWICE A DAY 180 tablet 1  ? Lancet Devices (LANCET DEVICE WITH EJECTOR) MISC 1 Device by Does not apply route daily. 1 each 1  ? loperamide (IMODIUM A-D) 2 MG tablet Take 2 mg by mouth 4 (four) times daily as needed for diarrhea or loose stools.    ? nitroGLYCERIN (NITROSTAT) 0.4 MG SL tablet Place 1 tablet (0.4 mg total) under the tongue every 5 (five) minutes as needed for chest pain. 50 tablet 1  ? OneTouch Delica Lancets 61P MISC USE TO CHECK BLOOD SUGAR DAILY AND PRN 100 each 4  ? ONETOUCH ULTRA test strip USE TO CHECK BLOOD SUGAR DAILY E11.9 100 strip 4  ? rOPINIRole (REQUIP) 0.5 MG tablet TAKE 1 TABLET BY MOUTH EVERYDAY AT BEDTIME 90 tablet 2  ? rosuvastatin (CRESTOR) 10 MG tablet TAKE 1 TABLET BY MOUTH EVERY DAY 90 tablet 1  ? tizanidine (ZANAFLEX) 2 MG capsule TAKE 4 CAPSULES (8 MG TOTAL) BY MOUTH AT BEDTIME AS NEEDED FOR MUSCLE SPASMS. 360 capsule 1  ? valsartan (DIOVAN) 80 MG tablet Take 1 tablet (80 mg total) by mouth daily. 90 tablet 3  ? ?Current Facility-Administered Medications on File Prior to Visit  ?Medication Dose Route Frequency Provider Last Rate Last Admin  ? triamcinolone acetonide (KENALOG-40) injection 40 mg  40 mg Intramuscular Once [name], [name], MD      ? ? ?Past Medical History:  ?Diagnosis Date  ? BACK PAIN, CHRONIC 04/18/2009  ? BENIGN PROSTATIC HYPERTROPHY, HX OF 01/19/2007  ? Carpal tunnel syndrome 03/24/2007  ? CORONARY ARTERY DISEASE 01/19/2007  ? DEPRESSION, CHRONIC 06/24/2007  ? DIABETES MELLITUS 01/19/2007   ? DIABETES MELLITUS, TYPE II, WITH NEUROLOGICAL COMPLICATIONS 5/0/9326  ? Diarrhea 02/23/2007  ? HEART MURMUR, SYSTOLIC 10/23/4578  ? HERPES SIMPLEX INFECTION 01/19/2007  ? HYPERLIPIDEMIA 01/19/2007  ? HYPERTENSION 01/19/2007  ? Intermediate coronary syndrome ([place]) 08/29/2008  ? LEG CRAMPS, NOCTURNAL 10/28/2007  ? Leg cramps, sleep related 08/05/2010  ? OBSTRUCTIVE SLEEP APNEA 01/19/2007  ? does not use CPAP  ? OSTEOARTHRITIS 06/24/2007  ? Other postprocedural status(V45.89) 01/19/2007  ? PERCUTANEOUS TRANSLUMINAL CORONARY ANGIOPLASTY, HX OF 01/19/2007  ? PLANTAR FASCIITIS, RIGHT 01/19/2007  ? SHOULDER PAIN, LEFT 03/24/2007  ? SPINAL STENOSIS, LUMBAR 06/03/2009  ? TINEA PEDIS 11/15/2009  ? URI 01/11/2009  ? ?No Known Allergies ? ?Social History  ? ?Socioeconomic History  ? Marital status: Married  ?  Spouse name: Not on file  ? Number of children: Not on file  ?  Years of education: Not on file  ? Highest education level: Not on file  ?Occupational History  ? Occupation: retired  ?  Comment: Truck driver [employer]  ?Tobacco Use  ? Smoking status: Former  ?  Types: Cigarettes  ?  Quit date: 04/14/1983  ?  Years since quitting: 38.3  ? Smokeless tobacco: Never  ? Tobacco comments:  ?  stopped 1985  ?Vaping Use  ? Vaping Use: Never used  ?Substance and Sexual Activity  ? Alcohol use: Yes  ?  Comment: OCCASSIONAL  ? Drug use: Not Currently  ?  Types: Marijuana  ?  Comment: occ  ? Sexual activity: Not on file  ?Other Topics Concern  ? Not on file  ?Social History Narrative  ? Married in 1959 - 65yr, divorced; married 1974 - 1.5 years, divorced; married 1985 1 daughter 152 [id number] daughters; 2 grandchildren.  ? ?Social Determinants of Health  ? ?Financial Resource Strain: Low Risk   ? Difficulty of Paying Living Expenses: Not hard at all  ?Food Insecurity: No Food Insecurity  ? Worried About RCharity fundraiserin the Last Year: Never true  ? Ran Out of Food in the Last Year: Never true  ?Transportation Needs: No Transportation Needs  ?  Lack of Transportation (Medical): No  ? Lack of Transportation (Non-Medical): No  ?Physical Activity: Insufficiently Active  ? Days of Exercise per Week: 1 day  ? Minutes of Exercise per Session: 10 min  ?Stress: No S

## 2021-08-13 ENCOUNTER — Ambulatory Visit (INDEPENDENT_AMBULATORY_CARE_PROVIDER_SITE_OTHER): Payer: Medicare HMO | Admitting: Family Medicine

## 2021-08-13 ENCOUNTER — Encounter: Payer: Self-pay | Admitting: Family Medicine

## 2021-08-13 VITALS — BP 132/80 | HR 79 | Resp 16 | Ht 63.0 in | Wt 156.5 lb

## 2021-08-13 DIAGNOSIS — N401 Enlarged prostate with lower urinary tract symptoms: Secondary | ICD-10-CM

## 2021-08-13 DIAGNOSIS — I119 Hypertensive heart disease without heart failure: Secondary | ICD-10-CM

## 2021-08-13 DIAGNOSIS — R252 Cramp and spasm: Secondary | ICD-10-CM | POA: Diagnosis not present

## 2021-08-13 DIAGNOSIS — R600 Localized edema: Secondary | ICD-10-CM | POA: Diagnosis not present

## 2021-08-13 DIAGNOSIS — E782 Mixed hyperlipidemia: Secondary | ICD-10-CM | POA: Diagnosis not present

## 2021-08-13 DIAGNOSIS — R351 Nocturia: Secondary | ICD-10-CM | POA: Diagnosis not present

## 2021-08-13 DIAGNOSIS — E1140 Type 2 diabetes mellitus with diabetic neuropathy, unspecified: Secondary | ICD-10-CM

## 2021-08-13 DIAGNOSIS — I25118 Atherosclerotic heart disease of native coronary artery with other forms of angina pectoris: Secondary | ICD-10-CM | POA: Diagnosis not present

## 2021-08-13 LAB — POCT GLYCOSYLATED HEMOGLOBIN (HGB A1C): HbA1c, POC (controlled diabetic range): 7 % (ref 0.0–7.0)

## 2021-08-13 MED ORDER — ISOSORBIDE MONONITRATE ER 30 MG PO TB24
30.0000 mg | ORAL_TABLET | Freq: Every day | ORAL | 2 refills | Status: DC
Start: 2021-08-13 — End: 2022-05-15

## 2021-08-13 NOTE — Patient Instructions (Addendum)
A few things to remember from today's visit: ? ?Type 2 diabetes mellitus with diabetic neuropathy, without long-term current use of insulin ([place]) - Plan: POC HgB A1c ? ?Hypertension with heart disease - Plan: Microalbumin / creatinine urine ratio, Comprehensive metabolic panel ? ?Hyperlipidemia, mixed ? ?Bilateral lower extremity edema ? ?If you need refills please call your pharmacy. ?Do not use My Chart to request refills or for acute issues that need immediate attention. ?  ?No changes today. ?Try to take meds as prescribed. ?Fall precautions. ?Alcohol with moderation. ?Leg elevation above heart level and compression stocking will help with swelling. ? ?Please be sure medication list is accurate. ?If a new problem present, please set up appointment sooner than planned today. ? ? ? ? ? ? ? ?

## 2021-08-14 LAB — MICROALBUMIN / CREATININE URINE RATIO
Creatinine,U: 45.7 mg/dL
Microalb Creat Ratio: 7.6 mg/g (ref 0.0–30.0)
Microalb, Ur: 3.5 mg/dL — ABNORMAL HIGH (ref 0.0–1.9)

## 2021-08-14 LAB — COMPREHENSIVE METABOLIC PANEL WITH GFR
ALT: 18 U/L (ref 0–53)
AST: 23 U/L (ref 0–37)
Albumin: 4.7 g/dL (ref 3.5–5.2)
Alkaline Phosphatase: 67 U/L (ref 39–117)
BUN: 16 mg/dL (ref 6–23)
CO2: 27 meq/L (ref 19–32)
Calcium: 9.5 mg/dL (ref 8.4–10.5)
Chloride: 104 meq/L (ref 96–112)
Creatinine, Ser: 0.74 mg/dL (ref 0.40–1.50)
GFR: 81.48 mL/min
Glucose, Bld: 136 mg/dL — ABNORMAL HIGH (ref 70–99)
Potassium: 4.9 meq/L (ref 3.5–5.1)
Sodium: 138 meq/L (ref 135–145)
Total Bilirubin: 1 mg/dL (ref 0.2–1.2)
Total Protein: 7.3 g/dL (ref 6.0–8.3)

## 2021-08-16 MED ORDER — ALFUZOSIN HCL ER 10 MG PO TB24: 10.0000 mg | ORAL_TABLET | Freq: Every day | ORAL | 1 refills | Status: DC

## 2021-08-16 MED ORDER — GLIPIZIDE 5 MG PO TABS: 5.0000 mg | ORAL_TABLET | Freq: Every day | ORAL | 1 refills | Status: DC

## 2021-09-10 ENCOUNTER — Other Ambulatory Visit: Payer: Self-pay | Admitting: Family Medicine

## 2021-09-10 DIAGNOSIS — N401 Enlarged prostate with lower urinary tract symptoms: Secondary | ICD-10-CM

## 2021-09-15 ENCOUNTER — Other Ambulatory Visit: Payer: Self-pay | Admitting: Family Medicine

## 2021-09-15 DIAGNOSIS — N401 Enlarged prostate with lower urinary tract symptoms: Secondary | ICD-10-CM

## 2021-09-19 ENCOUNTER — Telehealth: Payer: Self-pay | Admitting: Family Medicine

## 2021-09-19 NOTE — Telephone Encounter (Signed)
[name] health advocate with [facility] is calling to report pt told her he take rosuvastatin (CRESTOR) 10 MG tablet only twice a week instead of daily. Please advise

## 2021-09-19 NOTE — Telephone Encounter (Signed)
Medication was stopped at last visit.

## 2021-09-22 ENCOUNTER — Telehealth: Payer: Self-pay | Admitting: Pharmacist

## 2021-09-22 ENCOUNTER — Other Ambulatory Visit: Payer: Self-pay | Admitting: Family Medicine

## 2021-09-22 ENCOUNTER — Encounter: Payer: Self-pay | Admitting: Family Medicine

## 2021-09-22 DIAGNOSIS — N401 Enlarged prostate with lower urinary tract symptoms: Secondary | ICD-10-CM

## 2021-09-22 NOTE — Chronic Care Management (AMB) (Unsigned)
Chronic Care Management Pharmacy Assistant   Name: [name]  MRN: [id number] DOB: 14-Apr-1931  Reason for Encounter: Disease State   Recent office visits:  08/13/21 Andrew, [name], MD - Patient presented for type 2 diabetes mellitus with diabetic neuropathy without long term current use of insulin and other concerns. Prescribed Alfuzosin. Changed Isosorbide Stopped Tamsulosin.  Patient Stopped Crestor  07/08/21 Andrew, [name], MD - Patient presented for URI and other concerns. Prescribed Benzonatate. Prescribed Doxycycline. Prescribed Hydrocodone Bit-Homatrop  06/06/21 Andrew, [name], MD - Patient presented for Hypertension with heart disease and other concerns. Prescribed Valsartan 40 mg.   05/02/21 Andrew, [name], MD - Patient presented for Headache unspecified and other concerns. Changed Isosorbide Mononitrate.   Recent consult visits:  None   Hospital visits:  None in previous 6 months  Medications: Outpatient Encounter Medications as of 09/22/2021  Medication Sig   acetaminophen (TYLENOL) 500 MG tablet Take 500 mg by mouth every 6 (six) hours as needed.   albuterol (VENTOLIN HFA) 108 (90 Base) MCG/ACT inhaler Inhale 2 puffs into the lungs every 6 (six) hours as needed. (Patient taking differently: Inhale 2 puffs into the lungs every 6 (six) hours as needed for wheezing or shortness of breath.)   alfuzosin (UROXATRAL) 10 MG 24 hr tablet TAKE 1 TABLET (10 MG TOTAL) BY MOUTH DAILY WITH BREAKFAST.   Blood Glucose Monitoring Suppl (ACCU-CHEK AVIVA CONNECT) w/Device KIT 1 Device by Does not apply route daily.   Cyanocobalamin (B-12) 1000 MCG TABS Take 1,000 mcg by mouth daily.   finasteride (PROSCAR) 5 MG tablet TAKE 1 TABLET BY MOUTH EVERY DAY   fluticasone (FLONASE) 50 MCG/ACT nasal spray PLACE 1 SPRAY INTO BOTH NOSTRILS 2 (TWO) TIMES DAILY   furosemide (LASIX) 40 MG tablet TAKE 1 TABLET BY MOUTH TWICE A DAY   glipiZIDE (GLUCOTROL) 5 MG tablet Take 1 tablet (5 mg total) by  mouth daily before breakfast. 20-30 minutes before meal.   isosorbide mononitrate (IMDUR) 30 MG 24 hr tablet Take 1 tablet (30 mg total) by mouth daily.   Lancet Devices (LANCET DEVICE WITH EJECTOR) MISC 1 Device by Does not apply route daily.   loperamide (IMODIUM A-D) 2 MG tablet Take 2 mg by mouth 4 (four) times daily as needed for diarrhea or loose stools.   nitroGLYCERIN (NITROSTAT) 0.4 MG SL tablet Place 1 tablet (0.4 mg total) under the tongue every 5 (five) minutes as needed for chest pain.   OneTouch Delica Lancets 51Z MISC USE TO CHECK BLOOD SUGAR DAILY AND PRN   ONETOUCH ULTRA test strip USE TO CHECK BLOOD SUGAR ONCE DAILY E11.9   rOPINIRole (REQUIP) 0.5 MG tablet TAKE 1 TABLET BY MOUTH EVERYDAY AT BEDTIME   rosuvastatin (CRESTOR) 10 MG tablet TAKE 1 TABLET BY MOUTH EVERY DAY   tizanidine (ZANAFLEX) 2 MG capsule TAKE 4 CAPSULES (8 MG TOTAL) BY MOUTH AT BEDTIME AS NEEDED FOR MUSCLE SPASMS.   valsartan (DIOVAN) 80 MG tablet Take 1 tablet (80 mg total) by mouth daily.   Facility-Administered Encounter Medications as of 09/22/2021  Medication   triamcinolone acetonide (KENALOG-40) injection 40 mg  [employer] for General Review Call   Chart Review:  Have there been any documented new, changed, or discontinued medications since last visit? {yes/no:20286} (If yes, include name, dose, frequency, date) Has there been any documented recent hospitalizations or ED visits since last visit with Clinical Pharmacist? {yes/no:20286} Brief Summary (including medication and/or Diagnosis changes):   Adherence  Review:  Does the Clinical Pharmacist Assistant have access to adherence rates? {yes/no:20286} Adherence rates for STAR metric medications (List medication(s)/day supply/ last 2 fill dates). Adherence rates for medications indicated for disease state being reviewed (List medication(s)/day supply/ last 2 fill dates). Does the patient have >5 day gap between last estimated fill  dates for any of the above medications or other medication gaps? {yes/no:20286} Reason for medication gaps.   Disease State Questions:  Able to connect with Patient? {yes/no:20286} Did patient have any problems with their health recently? {yes/no:20286} Note problems and Concerns: Have you had any admissions or emergency room visits or worsening of your condition(s) since last visit? {yes/no:20286} Details of ED visit, hospital visit and/or worsening condition(s): Have you had any visits with new specialists or providers since your last visit? {yes/no:20286} Explain: Have you had any new health care problem(s) since your last visit? {yes/no:20286} New problem(s) reported: Have you run out of any of your medications since you last spoke with clinical pharmacist? {yes/no:20286} What caused you to run out of your medications? Are there any medications you are not taking as prescribed? {yes/no:20286} What kept you from taking your medications as prescribed? Are you having any issues or side effects with your medications? {yes/no:20286} Note of issues or side effects: Do you have any other health concerns or questions you want to discuss with your Clinical Pharmacist before your next visit? {yes/no:20286} Note additional concerns and questions from Patient. Are there any health concerns that you feel we can do a better job addressing? {yes/no:20286} Note Patient's response. Are you having any problems with any of the following since the last visit: (select all that apply)  {General Call:27390}  Details: 12. Any falls since last visit? {yes/no:20286}  Details: 13. Any increased or uncontrolled pain since last visit? {yes/no:20286}  Details: 14. Next visit Type: {Telephone/Office:25179}       Visit with:        Date:        Time:  40. Additional Details? {yes/no:20286}    Care Gaps: Foot Exam - Overdue Eye Exam - Overdue COVID Booster - Overdue Zoster Vaccine - Postponed PNA  Vaccine - Postponed BP- 132/80 ( 08/13/21) AWV- 10/22 CCM- Need  Star Rating Drugs: Glipizide 5 mg - Last filled [facility]/18/21 [facility] Valsartan 80 mg - Last filled 07/28/21 [facility] Rosuvastatin -  (Patient not taking) [facility] Pharmacist [facility] [phone]

## 2021-09-23 MED ORDER — ALFUZOSIN HCL ER 10 MG PO TB24: 10.0000 mg | ORAL_TABLET | Freq: Every day | ORAL | 2 refills | Status: DC

## 2021-10-09 ENCOUNTER — Other Ambulatory Visit: Payer: Self-pay | Admitting: Family Medicine

## 2021-10-13 ENCOUNTER — Other Ambulatory Visit: Payer: Self-pay | Admitting: Family Medicine

## 2021-10-13 DIAGNOSIS — R252 Cramp and spasm: Secondary | ICD-10-CM

## 2021-10-15 ENCOUNTER — Encounter: Payer: Self-pay | Admitting: Family Medicine

## 2021-10-17 ENCOUNTER — Encounter: Payer: Self-pay | Admitting: Family Medicine

## 2021-12-11 ENCOUNTER — Other Ambulatory Visit: Payer: Self-pay | Admitting: Family Medicine

## 2021-12-11 DIAGNOSIS — E782 Mixed hyperlipidemia: Secondary | ICD-10-CM

## 2021-12-11 DIAGNOSIS — I25110 Atherosclerotic heart disease of native coronary artery with unstable angina pectoris: Secondary | ICD-10-CM

## 2022-01-28 ENCOUNTER — Ambulatory Visit (INDEPENDENT_AMBULATORY_CARE_PROVIDER_SITE_OTHER): Payer: Medicare HMO

## 2022-01-28 VITALS — Ht 63.0 in | Wt 156.0 lb

## 2022-01-28 DIAGNOSIS — Z0000 Encounter for general adult medical examination without abnormal findings: Secondary | ICD-10-CM | POA: Diagnosis not present

## 2022-01-28 NOTE — Progress Notes (Signed)
Subjective:   [name] is a 88 y.o. male who presents for Medicare Annual/Subsequent preventive examination.  Review of Systems    Virtual Visit via Telephone Note  I connected with  [name] on 01/28/22 at  2:45 PM EDT by telephone and verified that I am speaking with the correct person using two identifiers.  Location: Patient: Home Provider: Office Persons participating in the virtual visit: patient/Nurse Health Advisor   I discussed the limitations, risks, security and privacy concerns of performing an evaluation and management service by telephone and the availability of in person appointments. The patient expressed understanding and agreed to proceed.  Interactive audio and video telecommunications were attempted between this nurse and patient, however failed, due to patient having technical difficulties OR patient did not have access to video capability.  We continued and completed visit with audio only.  Some vital signs may be absent or patient reported.   Andrew Peaches, LPN  Cardiac Risk Factors include: advanced age (>83mn, >>39women);diabetes mellitus;hypertension;male gender     Objective:    Today's Vitals   01/28/22 1451  Weight: 156 lb (70.8 kg)  Height: _0  (1.6 m)   Body mass index is 27.63 kg/m.     01/28/2022    3:00 PM 01/27/2021    1:16 PM 07/15/2020   10:04 AM 03/21/2020    1:04 PM 12/06/2019    2:05 PM 03/25/2016   12:04 PM 01/29/2016    9:22 AM  Advanced Directives  Does Patient Have a Medical Advance Directive? _1  Yes Yes  Type of [place]aramed[place]of [place] will [place] will Living will;Healthcare Power of [place] Living will Living will  Copy of [place] Chart? No - copy requested No - copy requested   No - copy requested      Current Medications  (verified) Outpatient Encounter Medications as of 01/28/2022  Medication Sig   acetaminophen (TYLENOL) 500 MG tablet Take 500 mg by mouth every 6 (six) hours as needed.   albuterol (VENTOLIN HFA) 108 (90 Base) MCG/ACT inhaler Inhale 2 puffs into the lungs every 6 (six) hours as needed. (Patient taking differently: Inhale 2 puffs into the lungs every 6 (six) hours as needed for wheezing or shortness of breath.)   alfuzosin (UROXATRAL) 10 MG 24 hr tablet Take 1 tablet (10 mg total) by mouth daily with breakfast.   Blood Glucose Monitoring Suppl (ACCU-CHEK AVIVA CONNECT) w/Device KIT 1 Device by Does not apply route daily.   Cyanocobalamin (B-12) 1000 MCG TABS Take 1,000 mcg by mouth daily.   finasteride (PROSCAR) 5 MG tablet TAKE 1 TABLET BY MOUTH EVERY DAY   fluticasone (FLONASE) 50 MCG/ACT nasal spray PLACE 1 SPRAY INTO BOTH NOSTRILS 2 (TWO) TIMES DAILY   furosemide (LASIX) 40 MG tablet TAKE 1 TABLET BY MOUTH TWICE A DAY   glipiZIDE (GLUCOTROL) 5 MG tablet Take 1 tablet (5 mg total) by mouth daily before breakfast. 20-30 minutes before meal.   isosorbide mononitrate (IMDUR) 30 MG 24 hr tablet Take 1 tablet (30 mg total) by mouth daily.   Lancet Devices (LANCET DEVICE WITH EJECTOR) MISC 1 Device by Does not apply route daily.   loperamide (IMODIUM A-D) 2 MG tablet Take 2 mg by mouth 4 (four) times daily as needed for diarrhea or loose stools.   nitroGLYCERIN (NITROSTAT) 0.4 MG SL tablet Place  1 tablet (0.4 mg total) under the tongue every 5 (five) minutes as needed for chest pain.   OneTouch Delica Lancets 67R MISC USE TO CHECK BLOOD SUGAR DAILY AND PRN   ONETOUCH ULTRA test strip USE TO CHECK BLOOD SUGAR ONCE DAILY E11.9   rOPINIRole (REQUIP) 0.5 MG tablet TAKE 1 TABLET BY MOUTH EVERYDAY AT BEDTIME   rosuvastatin (CRESTOR) 10 MG tablet TAKE 1 TABLET BY MOUTH EVERY DAY   tizanidine (ZANAFLEX) 2 MG capsule TAKE 4 CAPSULES (8 MG TOTAL) BY MOUTH AT BEDTIME AS NEEDED FOR MUSCLE SPASMS.   valsartan  (DIOVAN) 80 MG tablet Take 1 tablet (80 mg total) by mouth daily.   Facility-Administered Encounter Medications as of 01/28/2022  Medication   triamcinolone acetonide (KENALOG-40) injection 40 mg    Allergies (verified) Patient has no known allergies.   History: Past Medical History:  Diagnosis Date   BACK PAIN, CHRONIC 04/18/2009   BENIGN PROSTATIC HYPERTROPHY, HX OF 01/19/2007   Carpal tunnel syndrome 03/24/2007   CORONARY ARTERY DISEASE 01/19/2007   DEPRESSION, CHRONIC 06/24/2007   DIABETES MELLITUS 01/19/2007   DIABETES MELLITUS, TYPE II, WITH NEUROLOGICAL COMPLICATIONS 12/13/6382   Diarrhea 02/23/2007   HEART MURMUR, SYSTOLIC 6/65/9935   HERPES SIMPLEX INFECTION 01/19/2007   HYPERLIPIDEMIA 01/19/2007   HYPERTENSION 01/19/2007   Intermediate coronary syndrome ([place]) 08/29/2008   LEG CRAMPS, NOCTURNAL 10/28/2007   Leg cramps, sleep related 08/05/2010   OBSTRUCTIVE SLEEP APNEA 01/19/2007   does not use CPAP   OSTEOARTHRITIS 06/24/2007   Other postprocedural status(V45.89) 01/19/2007   PERCUTANEOUS TRANSLUMINAL CORONARY ANGIOPLASTY, HX OF 01/19/2007   PLANTAR FASCIITIS, RIGHT 01/19/2007   SHOULDER PAIN, LEFT 03/24/2007   SPINAL STENOSIS, LUMBAR 06/03/2009   TINEA PEDIS 11/15/2009   URI 01/11/2009   Past Surgical History:  Procedure Laterality Date   arthroscopy, knee of hx     CARDIAC CATHETERIZATION     CATARACT EXTRACTION Left    COLONOSCOPY     inguinal herniorrhapy, hx     NASAL SINUS SURGERY     percutaneous transluminal coronary angioplasty, hx of  2004   Dr. Lyndel Safe   plastic joint in thumb     PTCA/stent  2004   TONSILLECTOMY     VASECTOMY     Family History  Problem Relation Age of Onset   Arthritis Sister    Diabetes Other    Coronary artery disease Other    Social History   Socioeconomic History   Marital status: Married    Spouse name: Not on file   Number of children: Not on file   Years of education: Not on file   Highest education level: Not on file   Occupational History   Occupation: retired    Comment: Truck Research officer, political party  Tobacco Use   Smoking status: Former    Types: Cigarettes    Quit date: 04/14/1983    Years since quitting: 38.8   Smokeless tobacco: Never   Tobacco comments:    stopped 1985  Vaping Use   Vaping Use: Never used  Substance and Sexual Activity   Alcohol use: Yes    Comment: OCCASSIONAL   Drug use: Not Currently    Types: Marijuana    Comment: occ   Sexual activity: Not on file  Other Topics Concern   Not on file  Social History Narrative   Married in [place] - 56yr, divorced; married 1974 - 1.5 years, divorced; married 1985 1 daughter 162 [id number] daughters; 2 grandchildren.   Social Determinants of  Health   Financial Resource Strain: Low Risk  (01/28/2022)   Overall Financial Resource Strain (CARDIA)    Difficulty of Paying Living Expenses: Not hard at all  Food Insecurity: No Food Insecurity (01/28/2022)   Hunger Vital Sign    Worried About Running Out of Food in the Last Year: Never true    Ran Out of Food in the Last Year: Never true  Transportation Needs: No Transportation Needs (01/27/2021)   PRAPARE - Hydrologist (Medical): No    Lack of Transportation (Non-Medical): No  Physical Activity: Inactive (01/28/2022)   Exercise Vital Sign    Days of Exercise per Week: 0 days    Minutes of Exercise per Session: 0 min  Stress: No Stress Concern Present (01/28/2022)   [place]    Feeling of Stress : Not at all  Social Connections: Moderately Isolated (01/28/2022)   Social Connection and Isolation Panel [NHANES]    Frequency of Communication with Friends and Family: More than three times a week    Frequency of Social Gatherings with Friends and Family: More than three times a week    Attends Religious Services: Never    Marine scientist or Organizations: No    Attends Arts administrator: Never    Marital Status: Married    Tobacco Counseling Counseling given: Not Answered Tobacco comments: stopped 1985   Clinical Intake:  Pre-visit preparation completed: No  Pain : No/denies painNutrition Risk Assessment:  Has the patient had any N/V/D within the last 2 months?  No  Does the patient have any non-healing wounds?  No  Has the patient had any unintentional weight loss or weight gain?  No   Diabetes:  Is the patient diabetic?  Yes  If diabetic, was a CBG obtained today?  No  Did the patient bring in their glucometer from home?  No  How often do you monitor your CBG's? PRN.   Financial Strains and Diabetes Management:  Are you having any financial strains with the device, your supplies or your medication? No .  Does the patient want to be seen by Chronic Care Management for management of their diabetes?  No  Would the patient like to be referred to a Nutritionist or for Diabetic Management?  No   Diabetic Exams:  Diabetic Eye Exam: Completed No. Overdue for diabetic eye exam. Pt has been advised about the importance in completing this exam. A referral has been placed today. Message sent to referral coordinator for scheduling purposes. Advised pt to expect a call from office referred to regarding appt.  Diabetic Foot Exam: Completed No. Pt has been advised about the importance in completing this exam. Pt is scheduled for diabetic foot exam on Followed by PCP.       BMI - recorded: 27.63 Nutritional Status: BMI 25 -29 Overweight Nutritional Risks: None Diabetes: Yes CBG done?: No Did pt. bring in CBG monitor from home?: No  How often do you need to have someone help you when you read instructions, pamphlets, or other written materials from your doctor or pharmacy?: 1 - Never  Diabetic?  Yes  Interpreter Needed?: No  Information entered by :: Rolene Arbour LPN   Activities of Daily Living    01/28/2022    2:58 PM  In your present state  of health, do you have any difficulty performing the following activities:  Hearing? 1  Comment Wears hearing aids  Vision? 0  Difficulty concentrating or making decisions? 0  Walking or climbing stairs? 0  Dressing or bathing? 0  Doing errands, shopping? 0  Preparing Food and eating ? N  Using the Toilet? N  In the past six months, have you accidently leaked urine? Y  Comment Wears depends. Followed by PCP  Do you have problems with loss of bowel control? N  Managing your Medications? N  Managing your Finances? N  Housekeeping or managing your Housekeeping? N    Patient Care Team: [name], [name], MD as PCP - General (Family Medicine) Bettina Gavia Hilton Cork, MD as PCP - Cardiology (Cardiology) Viona Gilmore, [facility] as Pharmacist (Pharmacist)  Indicate any recent Medical Services you may have received from other than [facility] providers in the past year (date may be approximate).     Assessment:   This is a routine wellness examination for [facility].  Hearing/Vision screen Hearing Screening - Comments:: Wears hearing aids Vision Screening - Comments:: Wears rx glasses - up to date with routine eye exams with    Dietary issues and exercise activities discussed: Current Exercise Habits: The patient does not participate in regular exercise at present, Exercise limited by: None identified   Goals Addressed               This Visit's Progress     Stay Healthy (pt-stated)         Depression Screen    01/28/2022    2:57 PM 08/13/2021    3:56 PM 05/02/2021    5:20 PM 01/27/2021    1:14 PM 09/17/2020    5:13 PM 12/06/2019    2:00 PM 11/08/2018   12:55 PM  PHQ 2/9 Scores  PHQ - 2 Score 0 3 1 0 _0 PHQ- 9 Score  _1 Fall Risk    01/28/2022    2:59 PM 08/13/2021    3:56 PM 01/27/2021    1:16 PM 07/15/2020   10:03 AM 03/21/2020    1:04 PM  Fall Risk   Falls in the past year? 0 1 0 1 0  Number falls in past yr: 0 0 0 1 0  Injury with Fall? 0 0 0 0 0  Risk for fall due  to : No Fall Risks No Fall Risks     Follow up Falls prevention discussed Falls evaluation completed Falls evaluation completed      [place]:  Any stairs in or around the home? Yes  If so, are there any without handrails? No  Home free of loose throw rugs in walkways, pet beds, electrical cords, etc? Yes  Adequate lighting in your home to reduce risk of falls? Yes   ASSISTIVE DEVICES UTILIZED TO PREVENT FALLS:  Life alert? No  Use of a cane, walker or w/c? Yes  Grab bars in the bathroom? Yes Shower chair or bench in shower? No  Elevated toilet seat or a handicapped toilet? Yes   TIMED UP AND GO:  Was the test performed? No . Audio Visit  Cognitive Function:        01/28/2022    3:00 PM 12/06/2019    2:13 PM  6CIT Screen  What Year? 0 points 0 points  What month? 0 points 0 points  What time? 0 points   Count back from 20 0 points 0 points  Months in reverse 0 points 0 points  Repeat phrase 0 points  0 points  Total Score 0 points     Immunizations Immunization History  Administered Date(s) Administered   Fluad Quad(high Dose 65+) 01/30/2019   Influenza Split 01/12/2011, 01/18/2012   Influenza Whole 02/23/2007, 02/01/2008, 01/11/2009, 01/16/2010   Influenza, High Dose Seasonal PF 03/02/2016, 01/15/2017, 12/21/2017   Influenza-Unspecified 01/11/2013, 12/12/2013, 03/13/2021   PFIZER Comirnaty(Gray Top)Covid-19 Tri-Sucrose Vaccine 09/04/2020   PFIZER(Purple Top)SARS-COV-2 Vaccination 05/02/2019, 05/22/2019, 02/16/2020   Pneumococcal Conjugate-13 01/26/2013   Pneumococcal Polysaccharide-23 07/15/1993   Td 04/04/2009   Zoster, Live 02/12/2012      Flu Vaccine status: Up to date    Covid-19 vaccine status: Completed vaccines  Qualifies for Shingles Vaccine? Yes   Zostavax completed No   Shingrix Completed?: No.    Education has been provided regarding the importance of this vaccine. Patient has been advised to call insurance  company to determine out of pocket expense if they have not yet received this vaccine. Advised may also receive vaccine at local pharmacy or Health Dept. Verbalized acceptance and understanding.  Screening Tests Health Maintenance  Topic Date Due   OPHTHALMOLOGY EXAM  01/28/2022 (Originally 07/02/2020)   FOOT EXAM  01/29/2022 (Originally 01/30/2020)   COVID-19 Vaccine (5 - Pfizer series) 02/13/2022 (Originally 10/30/2020)   Zoster Vaccines- Shingrix (1 of 2) 04/16/2022 (Originally 02/19/1984)   Pneumonia Vaccine 62+ Years old (3 - PPSV23 or PCV20) 06/06/2022 (Originally 01/26/2014)   INFLUENZA VACCINE  07/12/2022 (Originally 11/11/2021)   HEMOGLOBIN A1C  02/13/2022   HPV VACCINES  Aged Out   TETANUS/TDAP  Discontinued    Health Maintenance  There are no preventive care reminders to display for this patient.   Colorectal cancer screening: No longer required.   Lung Cancer Screening: (Low Dose CT Chest recommended if Age 51-80 years, 30 pack-year currently smoking OR have quit w/in 15years.) does not qualify.    Additional Screening:  Hepatitis C Screening: does not qualify; Completed   Vision Screening: Recommended annual ophthalmology exams for early detection of glaucoma and other disorders of the eye. Is the patient up to date with their annual eye exam?  Yes  Who is the provider or what is the name of the office in which the patient attends annual eye exams? Dr Talbert Forest If pt is not established with a provider, would they like to be referred to a provider to establish care? No .   Dental Screening: Recommended annual dental exams for proper oral hygiene  Community Resource Referral / Chronic Care Management:  CRR required this visit?  No   CCM required this visit?  No      Plan:     I have personally reviewed and noted the following in the patient's chart:   Medical and social history Use of alcohol, tobacco or illicit drugs  Current medications and supplements  including opioid prescriptions. Patient is not currently taking opioid prescriptions. Functional ability and status Nutritional status Physical activity Advanced directives List of other physicians Hospitalizations, surgeries, and ER visits in previous 12 months Vitals Screenings to include cognitive, depression, and falls Referrals and appointments  In addition, I have reviewed and discussed with patient certain preventive protocols, quality metrics, and best practice recommendations. A written personalized care plan for preventive services as well as general preventive health recommendations were provided to patient.     Andrew Peaches, LPN   80/32/1224   Nurse Notes: None

## 2022-01-28 NOTE — Patient Instructions (Addendum)
[name] , Thank you for taking time to come for your Medicare Wellness Visit. I appreciate your ongoing commitment to your health goals. Please review the following plan we discussed and let me know if I can assist you in the future.   These are the goals we discussed:  Goals       Patient Stated      Stay healthy      Stay Healthy (pt-stated)      Track and Manage My Blood Pressure-Hypertension      Timeframe:  Short-Term Goal Priority:  Medium Start Date:                             Expected End Date:                       Follow Up Date 03/22/21    - check blood pressure weekly - choose a place to take my blood pressure (home, clinic or office, retail store) - write blood pressure results in a log or diary    Why is this important?   You won't feel high blood pressure, but it can still hurt your blood vessels.  High blood pressure can cause heart or kidney problems. It can also cause a stroke.  Making lifestyle changes like losing a little weight or eating less salt will help.  Checking your blood pressure at home and at different times of the day can help to control blood pressure.  If the doctor prescribes medicine remember to take it the way the doctor ordered.  Call the office if you cannot afford the medicine or if there are questions about it.     Notes:         This is a list of the screening recommended for you and due dates:  Health Maintenance  Topic Date Due   Eye exam for diabetics  01/28/2022*   Complete foot exam   01/29/2022*   COVID-19 Vaccine (5 - Pfizer series) 02/13/2022*   Zoster (Shingles) Vaccine (1 of 2) 04/16/2022*   Pneumonia Vaccine (3 - PPSV23 or PCV20) 06/06/2022*   Flu Shot  07/12/2022*   Hemoglobin A1C  02/13/2022   HPV Vaccine  Aged Out   Tetanus Vaccine  Discontinued  *Topic was postponed. The date shown is not the original due date.    Advanced directives: Please bring a copy of your health care power of attorney and living will  to the office to be added to your chart at your convenience.   Conditions/risks identified: None  Next appointment: Follow up in one year for your annual wellness visit.   Preventive Care 39 Years and Older, Male  Preventive care refers to lifestyle choices and visits with your health care provider that can promote health and wellness. What does preventive care include? A yearly physical exam. This is also called an annual well check. Dental exams once or twice a year. Routine eye exams. Ask your health care provider how often you should have your eyes checked. Personal lifestyle choices, including: Daily care of your teeth and gums. Regular physical activity. Eating a healthy diet. Avoiding tobacco and drug use. Limiting alcohol use. Practicing safe sex. Taking low doses of aspirin every day. Taking vitamin and mineral supplements as recommended by your health care provider. What happens during an annual well check? The services and screenings done by your health care provider during your annual well  check will depend on your age, overall health, lifestyle risk factors, and family history of disease. Counseling  Your health care provider may ask you questions about your: Alcohol use. Tobacco use. Drug use. Emotional well-being. Home and relationship well-being. Sexual activity. Eating habits. History of falls. Memory and ability to understand (cognition). Work and work Statistician. Screening  You may have the following tests or measurements: Height, weight, and BMI. Blood pressure. Lipid and cholesterol levels. These may be checked every 5 years, or more frequently if you are over 32 years old. Skin check. Lung cancer screening. You may have this screening every year starting at age 11 if you have a 30-pack-year history of smoking and currently smoke or have quit within the past 15 years. Fecal occult blood test (FOBT) of the stool. You may have this test every year  starting at age 56. Flexible sigmoidoscopy or colonoscopy. You may have a sigmoidoscopy every 5 years or a colonoscopy every 10 years starting at age 50. Prostate cancer screening. Recommendations will vary depending on your family history and other risks. Hepatitis C blood test. Hepatitis B blood test. Sexually transmitted disease (STD) testing. Diabetes screening. This is done by checking your blood sugar (glucose) after you have not eaten for a while (fasting). You may have this done every 1-3 years. Abdominal aortic aneurysm (AAA) screening. You may need this if you are a current or former smoker. Osteoporosis. You may be screened starting at age 53 if you are at high risk. Talk with your health care provider about your test results, treatment options, and if necessary, the need for more tests. Vaccines  Your health care provider may recommend certain vaccines, such as: Influenza vaccine. This is recommended every year. Tetanus, diphtheria, and acellular pertussis (Tdap, Td) vaccine. You may need a Td booster every 10 years. Zoster vaccine. You may need this after age 44. Pneumococcal 13-valent conjugate (PCV13) vaccine. One dose is recommended after age 70. Pneumococcal polysaccharide (PPSV23) vaccine. One dose is recommended after age 84. Talk to your health care provider about which screenings and vaccines you need and how often you need them. This information is not intended to replace advice given to you by your health care provider. Make sure you discuss any questions you have with your health care provider. Document Released: 04/26/2015 Document Revised: 12/18/2015 Document Reviewed: 01/29/2015 Elsevier Interactive Patient Education  2017 [place] Prevention in the Home Falls can cause injuries. They can happen to people of all ages. There are many things you can do to make your home safe and to help prevent falls. What can I do on the outside of my home? Regularly fix  the edges of walkways and driveways and fix any cracks. Remove anything that might make you trip as you walk through a door, such as a raised step or threshold. Trim any bushes or trees on the path to your home. Use bright outdoor lighting. Clear any walking paths of anything that might make someone trip, such as rocks or tools. Regularly check to see if handrails are loose or broken. Make sure that both sides of any steps have handrails. Any raised decks and porches should have guardrails on the edges. Have any leaves, snow, or ice cleared regularly. Use sand or salt on walking paths during winter. Clean up any spills in your garage right away. This includes oil or grease spills. What can I do in the bathroom? Use night lights. Install grab bars by the toilet and  in the tub and shower. Do not use towel bars as grab bars. Use non-skid mats or decals in the tub or shower. If you need to sit down in the shower, use a plastic, non-slip stool. Keep the floor dry. Clean up any water that spills on the floor as soon as it happens. Remove soap buildup in the tub or shower regularly. Attach bath mats securely with double-sided non-slip rug tape. Do not have throw rugs and other things on the floor that can make you trip. What can I do in the bedroom? Use night lights. Make sure that you have a light by your bed that is easy to reach. Do not use any sheets or blankets that are too big for your bed. They should not hang down onto the floor. Have a firm chair that has side arms. You can use this for support while you get dressed. Do not have throw rugs and other things on the floor that can make you trip. What can I do in the kitchen? Clean up any spills right away. Avoid walking on wet floors. Keep items that you use a lot in easy-to-reach places. If you need to reach something above you, use a strong step stool that has a grab bar. Keep electrical cords out of the way. Do not use floor polish  or wax that makes floors slippery. If you must use wax, use non-skid floor wax. Do not have throw rugs and other things on the floor that can make you trip. What can I do with my stairs? Do not leave any items on the stairs. Make sure that there are handrails on both sides of the stairs and use them. Fix handrails that are broken or loose. Make sure that handrails are as long as the stairways. Check any carpeting to make sure that it is firmly attached to the stairs. Fix any carpet that is loose or worn. Avoid having throw rugs at the top or bottom of the stairs. If you do have throw rugs, attach them to the floor with carpet tape. Make sure that you have a light switch at the top of the stairs and the bottom of the stairs. If you do not have them, ask someone to add them for you. What else can I do to help prevent falls? Wear shoes that: Do not have high heels. Have rubber bottoms. Are comfortable and fit you well. Are closed at the toe. Do not wear sandals. If you use a stepladder: Make sure that it is fully opened. Do not climb a closed stepladder. Make sure that both sides of the stepladder are locked into place. Ask someone to hold it for you, if possible. Clearly mark and make sure that you can see: Any grab bars or handrails. First and last steps. Where the edge of each step is. Use tools that help you move around (mobility aids) if they are needed. These include: Canes. Walkers. Scooters. Crutches. Turn on the lights when you go into a dark area. Replace any light bulbs as soon as they burn out. Set up your furniture so you have a clear path. Avoid moving your furniture around. If any of your floors are uneven, fix them. If there are any pets around you, be aware of where they are. Review your medicines with your doctor. Some medicines can make you feel dizzy. This can increase your chance of falling. Ask your doctor what other things that you can do to help prevent  falls. This  information is not intended to replace advice given to you by your health care provider. Make sure you discuss any questions you have with your health care provider. Document Released: 01/24/2009 Document Revised: 09/05/2015 Document Reviewed: 05/04/2014 Elsevier Interactive Patient Education  2017 [employer].

## 2022-02-05 ENCOUNTER — Other Ambulatory Visit: Payer: Self-pay | Admitting: Family Medicine

## 2022-02-20 NOTE — Progress Notes (Unsigned)
HPI: [name] is a 88 y.o. male, who is here today with his wife for chronic disease management. He was last seen on 08/13/21.  The patient reports an episode of rectal urgency with slimy stool approximately two weeks ago. This is the third time in the past five years that he has experienced such an episode. He denies any recent changes in diet or the presence of blood in the stool. He also reports occasional mild abdominal pain and tenderness.  HTN: He is on Imdur 30 mg daily and Valsartan 80 mg daily.  Negative for severe/frequent headache, visual changes, chest pain, dyspnea, palpitation, focal weakness, or worsening edema. HFpEF:He is on Furosemide 40 mg bid, sometimes he does not take it because aggravates urinary frequency. Negative for orthopnea and PND. He is not longer following with cardiologist.  Lab Results  Component Value Date   CREATININE 0.74 08/13/2021   BUN 16 08/13/2021   NA 138 08/13/2021   K 4.9 08/13/2021   CL 104 08/13/2021   CO2 27 08/13/2021   DM II: He is on Glipizide 5 mg daily. Skipping meals,mainly breakfast, and eating late at night. Not checking BS's as regular as he has in the past. Negative for polydipsia,polyuria, or polyphagia. Lab Results  Component Value Date   HGBA1C 7.0 08/13/2021   Lab Results  Component Value Date   MICROALBUR 3.5 (H) 08/13/2021   He expresses concerns about memory lapses, such as forgetting items on the stove and difficulty recalling words or spelling. He has taken memory tests in the past,part of AWV examinations,and performed well but remains concerned about these episodes of forgetfulness. He is not longer seeing neurologist.  Lab Results  Component Value Date   TSH 2.36 08/13/2020   Lab Results  Component Value Date   [id number] 476 12/17/2017   He experiences urinary issues, including leakage after using the bathroom and presses on the bladder to help emptying his it during urinate. He is currently  taking Alfuzosin 10 mg daily, which seems to help more than Flomax. He is also on Proscar 5 mg daily. He has seen urologist in the past, did not keep follow up appts.  Generalized OA: He reports pain in the back, knees, and legs, which is not well managed by Tylenol.   He reports that occasionally he uses alcohol or marijuana for relief. In the past he has tried different medications, including Hydrocodone, Percocet,and Tramadol among some. He has also been to pain management but stopped attending due to cost.  COPD: He has not had cough or wheezing. Former smoker. OSA: Did not tolerate CPAP.  Depression: He has declined pharmacologic treatment. Years ago Sertraline was recommended. He has difficulty sleeping, often staying up late reading and waking up after only two to three hours of sleep. He has previously tried Doxepin,Trazodone,Lunesta (2015), and Gabapentin.  Review of Systems  Constitutional:  Positive for fatigue. Negative for chills and fever.  Respiratory:  Negative for cough and wheezing.   Endocrine: Negative for cold intolerance and heat intolerance.  Genitourinary:  Negative for dysuria and hematuria.  Musculoskeletal:  Positive for arthralgias, back pain and gait problem.  Skin:  Negative for rash.  Neurological:  Negative for syncope and facial asymmetry.  Psychiatric/Behavioral:  Positive for sleep disturbance. Negative for hallucinations.   ROS see pertinent positives and negatives in HPI.  Current Outpatient Medications on File Prior to Visit  Medication Sig Dispense Refill   acetaminophen (TYLENOL) 500 MG tablet Take 500 mg  by mouth every 6 (six) hours as needed.     albuterol (VENTOLIN HFA) 108 (90 Base) MCG/ACT inhaler Inhale 2 puffs into the lungs every 6 (six) hours as needed. (Patient taking differently: Inhale 2 puffs into the lungs every 6 (six) hours as needed for wheezing or shortness of breath.) 18 g 3   alfuzosin (UROXATRAL) 10 MG 24 hr tablet Take 1 tablet  (10 mg total) by mouth daily with breakfast. 90 tablet 2   Blood Glucose Monitoring Suppl (ACCU-CHEK AVIVA CONNECT) w/Device KIT 1 Device by Does not apply route daily. 1 kit 0   Cyanocobalamin (B-12) 1000 MCG TABS Take 1,000 mcg by mouth daily.     finasteride (PROSCAR) 5 MG tablet TAKE 1 TABLET BY MOUTH EVERY DAY 90 tablet 2   fluticasone (FLONASE) 50 MCG/ACT nasal spray PLACE 1 SPRAY INTO BOTH NOSTRILS 2 (TWO) TIMES DAILY 16 mL 2   furosemide (LASIX) 40 MG tablet TAKE 1 TABLET BY MOUTH TWICE A DAY 180 tablet 1   glipiZIDE (GLUCOTROL) 5 MG tablet TAKE 1 TABLET (5 MG TOTAL) BY MOUTH DAILY BEFORE BREAKFAST. 20-30 MINUTES BEFORE MEAL. 90 tablet 1   isosorbide mononitrate (IMDUR) 30 MG 24 hr tablet Take 1 tablet (30 mg total) by mouth daily. 90 tablet 2   Lancet Devices (LANCET DEVICE WITH EJECTOR) MISC 1 Device by Does not apply route daily. 1 each 1   loperamide (IMODIUM A-D) 2 MG tablet Take 2 mg by mouth 4 (four) times daily as needed for diarrhea or loose stools.     nitroGLYCERIN (NITROSTAT) 0.4 MG SL tablet Place 1 tablet (0.4 mg total) under the tongue every 5 (five) minutes as needed for chest pain. 50 tablet 1   OneTouch Delica Lancets 95K MISC USE TO CHECK BLOOD SUGAR DAILY AND PRN 100 each 4   ONETOUCH ULTRA test strip USE TO CHECK BLOOD SUGAR ONCE DAILY E11.9 100 strip 4   rOPINIRole (REQUIP) 0.5 MG tablet TAKE 1 TABLET BY MOUTH EVERYDAY AT BEDTIME 90 tablet 2   rosuvastatin (CRESTOR) 10 MG tablet TAKE 1 TABLET BY MOUTH EVERY DAY 90 tablet 1   tizanidine (ZANAFLEX) 2 MG capsule TAKE 4 CAPSULES (8 MG TOTAL) BY MOUTH AT BEDTIME AS NEEDED FOR MUSCLE SPASMS. 360 capsule 1   valsartan (DIOVAN) 80 MG tablet Take 1 tablet (80 mg total) by mouth daily. 90 tablet 3   Current Facility-Administered Medications on File Prior to Visit  Medication Dose Route Frequency Provider Last Rate Last Admin   triamcinolone acetonide (KENALOG-40) injection 40 mg  40 mg Intramuscular Once Gamble, Cassady Stanczak G, MD         Past Medical History:  Diagnosis Date   BACK PAIN, CHRONIC 04/18/2009   BENIGN PROSTATIC HYPERTROPHY, HX OF 01/19/2007   Carpal tunnel syndrome 03/24/2007   CORONARY ARTERY DISEASE 01/19/2007   DEPRESSION, CHRONIC 06/24/2007   DIABETES MELLITUS 01/19/2007   DIABETES MELLITUS, TYPE II, WITH NEUROLOGICAL COMPLICATIONS 12/14/2669   Diarrhea 02/23/2007   HEART MURMUR, SYSTOLIC 2/45/8099   HERPES SIMPLEX INFECTION 01/19/2007   HYPERLIPIDEMIA 01/19/2007   HYPERTENSION 01/19/2007   Intermediate coronary syndrome ([place]) 08/29/2008   LEG CRAMPS, NOCTURNAL 10/28/2007   Leg cramps, sleep related 08/05/2010   OBSTRUCTIVE SLEEP APNEA 01/19/2007   does not use CPAP   OSTEOARTHRITIS 06/24/2007   Other postprocedural status(V45.89) 01/19/2007   PERCUTANEOUS TRANSLUMINAL CORONARY ANGIOPLASTY, HX OF 01/19/2007   PLANTAR FASCIITIS, RIGHT 01/19/2007   SHOULDER PAIN, LEFT 03/24/2007   SPINAL STENOSIS, LUMBAR 06/03/2009  TINEA PEDIS 11/15/2009   URI 01/11/2009   No Known Allergies  Social History   Socioeconomic History   Marital status: Married    Spouse name: Not on file   Number of children: Not on file   Years of education: Not on file   Highest education level: 12th grade  Occupational History   Occupation: retired    Comment: Truck Research officer, political party  Tobacco Use   Smoking status: Former    Types: Cigarettes    Quit date: 04/14/1983    Years since quitting: 38.8   Smokeless tobacco: Never   Tobacco comments:    stopped 1985  Vaping Use   Vaping Use: Never used  Substance and Sexual Activity   Alcohol use: Yes    Comment: OCCASSIONAL   Drug use: Not Currently    Types: Marijuana    Comment: occ   Sexual activity: Not on file  Other Topics Concern   Not on file  Social History Narrative   Married in 1959 - 70yr, divorced; married 1974 - 1.5 years, divorced; married 1985 1 daughter 110 12step daughters; 2 grandchildren.   Social Determinants of Health   Financial Resource Strain: Low  Risk  (02/16/2022)   Overall Financial Resource Strain (CARDIA)    Difficulty of Paying Living Expenses: Not hard at all  Food Insecurity: No Food Insecurity (02/16/2022)   Hunger Vital Sign    Worried About Running Out of Food in the Last Year: Never true    Ran Out of Food in the Last Year: Never true  Transportation Needs: No Transportation Needs (02/16/2022)   PRAPARE - THydrologist(Medical): No    Lack of Transportation (Non-Medical): No  Physical Activity: Insufficiently Active (02/16/2022)   Exercise Vital Sign    Days of Exercise per Week: 1 day    Minutes of Exercise per Session: 10 min  Stress: Stress Concern Present (02/16/2022)   [place]   Feeling of Stress : To some extent  Social Connections: Unknown (02/16/2022)   Social Connection and Isolation Panel [NHANES]    Frequency of Communication with Friends and Family: Patient refused    Frequency of Social Gatherings with Friends and Family: Once a week    Attends Religious Services: Never    AMarine scientistor Organizations: No    Attends CMusic therapist Never    Marital Status: Married  Recent Concern: Social Connections - Moderately Isolated (01/28/2022)   Social Connection and Isolation Panel [NHANES]    Frequency of Communication with Friends and Family: More than three times a week    Frequency of Social Gatherings with Friends and Family: More than three times a week    Attends Religious Services: Never    AMarine scientistor Organizations: No    Attends CMusic therapist Never    Marital Status: Married   BP 132/80   Pulse 67   Temp 97.8 F (36.6 C) (Oral)   Resp 16   Ht _0  (1.6 m)   Wt 162 lb 8 oz (73.7 kg)   SpO2 97%   BMI 28.79 kg/m   Physical Exam Vitals and nursing note reviewed.  Constitutional:      General: He is not in acute distress.    Appearance: He is  well-developed.  HENT:     Head: Normocephalic and atraumatic.     Mouth/Throat:  Mouth: Mucous membranes are moist.  Eyes:     Conjunctiva/sclera: Conjunctivae normal.  Cardiovascular:     Rate and Rhythm: Normal rate and regular rhythm.     Heart sounds: Murmur (Dyastolic I-II/VI RUSB, SEM I/VI LUSB) heard.     Comments:   Pulmonary:     Effort: Pulmonary effort is normal. No respiratory distress.     Breath sounds: Normal breath sounds.  Abdominal:     Palpations: Abdomen is soft. There is no mass.     Tenderness: There is no abdominal tenderness.  Musculoskeletal:     Right lower leg: 1+ Pitting Edema present.     Left lower leg: 1+ Pitting Edema present.  Skin:    General: Skin is warm.     Findings: No erythema or rash.  Neurological:     Mental Status: He is alert and oriented to person, place, and time.     Cranial Nerves: No cranial nerve deficit.     Gait: Gait normal.  Psychiatric:     Comments: Well groomed, good eye contact.   ASSESSMENT AND PLAN:  [name] was seen today for follow-up, abdominal pain and insomnia.  Diagnoses and all orders for this visit: Orders Placed This Encounter  Procedures   CBC   TSH   Vitamin B12   POC HgB A1c   Lab Results  Component Value Date   HGBA1C 6.7 02/23/2022   Lab Results  Component Value Date   VITAMINB12 902 02/23/2022   Lab Results  Component Value Date   WBC 5.8 02/23/2022   HGB 14.7 02/23/2022   HCT 43.7 02/23/2022   MCV 92.3 02/23/2022   PLT 163.0 02/23/2022   Lab Results  Component Value Date   TSH 1.78 02/23/2022   Type 2 diabetes mellitus with diabetic neuropathy, unspecified ([place]) HgA1C is at goal. Continue Glipizide 5 mg daily. Caution with skipping meals. Annual eye exam (overdue) and foot care recommended. F/U in 5-6 months.  BPH associated with nocturia Problem has improved but not well controlled. He has tried different medications and has seen urologist. He is not interested  in going back to urologist. Continue Proscar and Alfuzosin same doses.  Chronic diarrhea With occasional abdominal discomfort, both problems chronic and otherwise stable. He declined GI referral, not interested in colonoscopy or other invasive procedure. Doxepin may help. Continue Imodium tid prn.  Generalized osteoarthritis of multiple sites Getting worse. He would like to try Hydrocodone-Acetaminophen again, educated about the importance of taking medication as prescribed. Side effects discussed. F/U in 4 weeks.  -     HYDROcodone-acetaminophen (NORCO/VICODIN) 5-325 MG tablet; Take 1 tablet by mouth daily as needed for moderate pain.  Other insomnia Good sleep hygiene, pain and BPH symptoms also interfere with sleep. He would like to try Doxepin, starting with 3 mg and can increase to 6 and 10 mg.   -     doxepin (SINEQUAN) 10 MG/ML solution; Take 0.3-1 mLs (3-10 mg total) by mouth at bedtime.  Memory difficulties We discussed possible etiologies. Some of his chronic medical conditions can be contributing factors. Neuropsychologic evaluation can be arranged, he is not interested in this.He is not interested in going back to neurologist. Today B12 and TSH ordered.  Chronic obstructive pulmonary disease, unspecified COPD type ([place]) Well controlled overall. Continue Albuterol inh 1-2 puff q 4-6 hours.  Chronic heart failure with preserved ejection fraction (H[facility]) Last echo in 08/2017, LVEF 60-65% and grade 2 diastolic dysfunction. Continue Furosemide 40 mg bid and  Valsartan. Could not afford Jardiance. Does not want to follow with cardiologist.  Mild episode of recurrent major depressive disorder ([place]) Exacerbated by chronic health problems. Doxepin may help.  I spent a total of 56 minutes in both face to face and non face to face activities for this visit on the date of this encounter. During this time history was obtained and documented, examination was performed, prior  labs/imaging reviewed, and assessment/plan discussed.  Return in about 4 weeks (around 03/23/2022) for chronic problems.  Andrew Prout G. [name], MD  [facility]. [place] office.

## 2022-02-23 ENCOUNTER — Ambulatory Visit (INDEPENDENT_AMBULATORY_CARE_PROVIDER_SITE_OTHER): Payer: Medicare HMO | Admitting: Family Medicine

## 2022-02-23 ENCOUNTER — Encounter: Payer: Self-pay | Admitting: Family Medicine

## 2022-02-23 VITALS — BP 132/80 | HR 67 | Temp 97.8°F | Resp 16 | Ht 63.0 in | Wt 162.5 lb

## 2022-02-23 DIAGNOSIS — J449 Chronic obstructive pulmonary disease, unspecified: Secondary | ICD-10-CM | POA: Diagnosis not present

## 2022-02-23 DIAGNOSIS — M159 Polyosteoarthritis, unspecified: Secondary | ICD-10-CM | POA: Diagnosis not present

## 2022-02-23 DIAGNOSIS — E1140 Type 2 diabetes mellitus with diabetic neuropathy, unspecified: Secondary | ICD-10-CM | POA: Diagnosis not present

## 2022-02-23 DIAGNOSIS — R351 Nocturia: Secondary | ICD-10-CM

## 2022-02-23 DIAGNOSIS — N401 Enlarged prostate with lower urinary tract symptoms: Secondary | ICD-10-CM

## 2022-02-23 DIAGNOSIS — K529 Noninfective gastroenteritis and colitis, unspecified: Secondary | ICD-10-CM | POA: Diagnosis not present

## 2022-02-23 DIAGNOSIS — G4709 Other insomnia: Secondary | ICD-10-CM

## 2022-02-23 DIAGNOSIS — R69 Illness, unspecified: Secondary | ICD-10-CM | POA: Diagnosis not present

## 2022-02-23 DIAGNOSIS — R413 Other amnesia: Secondary | ICD-10-CM | POA: Diagnosis not present

## 2022-02-23 DIAGNOSIS — I5032 Chronic diastolic (congestive) heart failure: Secondary | ICD-10-CM

## 2022-02-23 DIAGNOSIS — E782 Mixed hyperlipidemia: Secondary | ICD-10-CM

## 2022-02-23 DIAGNOSIS — F330 Major depressive disorder, recurrent, mild: Secondary | ICD-10-CM

## 2022-02-23 LAB — CBC
HCT: 43.7 % (ref 39.0–52.0)
Hemoglobin: 14.7 g/dL (ref 13.0–17.0)
MCHC: 33.6 g/dL (ref 30.0–36.0)
MCV: 92.3 fl (ref 78.0–100.0)
Platelets: 163 10*3/uL (ref 150.0–400.0)
RBC: 4.74 Mil/uL (ref 4.22–5.81)
RDW: 13.2 % (ref 11.5–15.5)
WBC: 5.8 10*3/uL (ref 4.0–10.5)

## 2022-02-23 LAB — POCT GLYCOSYLATED HEMOGLOBIN (HGB A1C): HbA1c, POC (controlled diabetic range): 6.7 % (ref 0.0–7.0)

## 2022-02-23 LAB — VITAMIN B12: Vitamin B-12: 902 pg/mL (ref 211–911)

## 2022-02-23 LAB — TSH: TSH: 1.78 u[IU]/mL (ref 0.35–5.50)

## 2022-02-23 MED ORDER — DOXEPIN HCL 10 MG/ML PO CONC: 3.0000 mg | Freq: Every day | ORAL | 0 refills | Status: DC

## 2022-02-23 MED ORDER — ONETOUCH ULTRA 2 W/DEVICE KIT: PACK | 0 refills | Status: AC

## 2022-02-23 MED ORDER — HYDROCODONE-ACETAMINOPHEN 5-325 MG PO TABS: 1.0000 | ORAL_TABLET | Freq: Every day | ORAL | 0 refills | Status: DC | PRN

## 2022-02-23 NOTE — Assessment & Plan Note (Signed)
HgA1C is at goal. Continue Glipizide 5 mg daily. Caution with skipping meals. Annual eye exam (overdue) and foot care recommended. F/U in 5-6 months.

## 2022-02-23 NOTE — Patient Instructions (Addendum)
A few things to remember from today's visit:   Type 2 diabetes mellitus with diabetic neuropathy, without long-term current use of insulin ([place]) - Plan: POC HgB A1c, Blood Glucose Monitoring Suppl (ONE TOUCH ULTRA 2) w/Device KIT  Hyperlipidemia, mixed  BPH associated with nocturia  Chronic diarrhea  Generalized osteoarthritis of multiple sites - Plan: HYDROcodone-acetaminophen (NORCO/VICODIN) 5-325 MG tablet  Other insomnia - Plan: doxepin (SINEQUAN) 10 MG/ML solution  Memory difficulties - Plan: CBC, TSH, Vitamin B12  Hydrocodone-Acetaminophen stared today to take daily as needed for pain. Doxepin 0.3 ml at bed time, increase to 0.6 ml and 1 ml if needed. Hold on Imodium while starting these meds because all can cause constipation. No changes in rest.  If you need refills for medications you take chronically, please call your pharmacy. Do not use My Chart to request refills or for acute issues that need immediate attention. If you send a my chart message, it may take a few days to be addressed, specially if I am not in the office.  Please be sure medication list is accurate. If a new problem present, please set up appointment sooner than planned today.  Memory Compensation Strategies  Use "WARM" strategy.  W= write it down  A= associate it  R= repeat it  M= make a mental note  2.   You can keep a Social worker.  Use a 3-ring notebook with sections for the following: calendar, important names and phone numbers,  medications, doctors' names/phone numbers, lists/reminders, and a section to journal what you did  each day.   3.    Use a calendar to write appointments down.  4.    Write yourself a schedule for the day.  This can be placed on the calendar or in a separate section of the Memory Notebook.  Keeping a  regular schedule can help memory.  5.    Use medication organizer with sections for each day or morning/evening pills.  You may need help loading it  6.     Keep a basket, or pegboard by the door.  Place items that you need to take out with you in the basket or on the pegboard.  You may also want to  include a message board for reminders.  7.    Use sticky notes.  Place sticky notes with reminders in a place where the task is performed.  For example: " turn off the  stove" placed by the stove, "lock the door" placed on the door at eye level, " take your medications" on  the bathroom mirror or by the place where you normally take your medications.  8.    Use alarms/timers.  Use while cooking to remind yourself to check on food or as a reminder to take your medicine, or as a  reminder to make a call, or as a reminder to perform another task, etc.

## 2022-03-20 NOTE — Progress Notes (Unsigned)
HPI: [name] is a 88 y.o. male  with PMHx significant for OSA,DM II,HTN,CAD,HLD,chronic pain, insomnia, BPH, and COPD here today with his wife to follow on recent visit. He was last seen on 02/23/2022, when he was complaining of worsening generalized joint pain and insomnia. Hydrocodone-acetaminophen 5-325 mg and doxepin were started.  He reports that he initially took doxepin for sleep but experienced grogginess the next day and stopped taking it. He tried it again before the visit and experienced sore legs upon waking up. He is not sure about dose he took. He states that overall he does not sleep poorly and prefers not to take doxepin.   In regard to chronic pain, he took only five of Hydrocodone over the past four weeks and has difficulty remembering if they were helpful. He prefers to stick with Tylenol for pain management.  DM II: He admits to occasionally forgetting to take his Glipizide 5 mg.  He reports blood sugar levels ranging from 70 to over 300.  Lab Results  Component Value Date   HGBA1C 6.7 02/23/2022   His wife expresses concern about his circulation,LE numbness, this is a chronic problem, peripheral neuropathy, stable overall.  HTN: He is on Diovan 80 mg daily. CAD, denies chest pain or SOB for a long time. He is on Imdur 30 mg daily. He stopped taking Aspirin 81 mg, his wife states that he has easy bleeding. He is not on Rosuvastatin, willing to resume it. He thought is was aggravating LE cramps/achy pain. Lab Results  Component Value Date   CHOL 168 12/23/2020   HDL 48.60 12/23/2020   LDLCALC 93 12/23/2020   LDLDIRECT 121.0 04/26/2020   TRIG 132.0 12/23/2020   CHOLHDL 3 12/23/2020  Negative for severe/frequent headache, visual changes, palpitation, focal weakness, or worsening edema. He takes Furosemide daily as needed.  LE cramps, this is a chronic problem. He is on Zanaflex up to 6 mg tid. He has run out of Requip (Ropinirole) for about a week, but  reports that cramps have improved with age and has not noted changes since being off of Requip. He drinks tonic water nightly and this seems to help.  He also consumes alcohol, including two shots of whiskey with tonic water or red wine at night, which he believes may have health benefits.  Lab Results  Component Value Date   CREATININE 0.74 08/13/2021   BUN 16 08/13/2021   NA 138 08/13/2021   K 4.9 08/13/2021   CL 104 08/13/2021   CO2 27 08/13/2021   Lab Results  Component Value Date   MICROALBUR 3.5 (H) 08/13/2021   MICROALBUR 5.3 (H) 04/26/2020   Review of Systems  Constitutional:  Positive for fatigue. Negative for appetite change, chills and fever.  Respiratory:  Negative for cough and wheezing.   Gastrointestinal:  Negative for abdominal pain, nausea and vomiting.  Genitourinary:  Negative for decreased urine volume, dysuria and hematuria.  Musculoskeletal:  Positive for arthralgias and gait problem.  Skin:  Negative for rash.  Neurological:  Negative for syncope, facial asymmetry and weakness.  See other pertinent positives and negatives in HPI.  Current Outpatient Medications on File Prior to Visit  Medication Sig Dispense Refill   acetaminophen (TYLENOL) 500 MG tablet Take 500 mg by mouth every 6 (six) hours as needed.     albuterol (VENTOLIN HFA) 108 (90 Base) MCG/ACT inhaler Inhale 2 puffs into the lungs every 6 (six) hours as needed. (Patient taking differently: Inhale 2 puffs into  the lungs every 6 (six) hours as needed for wheezing or shortness of breath.) 18 g 3   alfuzosin (UROXATRAL) 10 MG 24 hr tablet Take 1 tablet (10 mg total) by mouth daily with breakfast. 90 tablet 2   Blood Glucose Monitoring Suppl (ACCU-CHEK AVIVA CONNECT) w/Device KIT 1 Device by Does not apply route daily. 1 kit 0   Blood Glucose Monitoring Suppl (ONE TOUCH ULTRA 2) w/Device KIT Daily as directed. 1 kit 0   Cyanocobalamin (B-12) 1000 MCG TABS Take 1,000 mcg by mouth daily.      finasteride (PROSCAR) 5 MG tablet TAKE 1 TABLET BY MOUTH EVERY DAY 90 tablet 2   fluticasone (FLONASE) 50 MCG/ACT nasal spray PLACE 1 SPRAY INTO BOTH NOSTRILS 2 (TWO) TIMES DAILY 16 mL 2   furosemide (LASIX) 40 MG tablet TAKE 1 TABLET BY MOUTH TWICE A DAY 180 tablet 1   glipiZIDE (GLUCOTROL) 5 MG tablet TAKE 1 TABLET (5 MG TOTAL) BY MOUTH DAILY BEFORE BREAKFAST. 20-30 MINUTES BEFORE MEAL. 90 tablet 1   isosorbide mononitrate (IMDUR) 30 MG 24 hr tablet Take 1 tablet (30 mg total) by mouth daily. 90 tablet 2   Lancet Devices (LANCET DEVICE WITH EJECTOR) MISC 1 Device by Does not apply route daily. 1 each 1   loperamide (IMODIUM A-D) 2 MG tablet Take 2 mg by mouth 4 (four) times daily as needed for diarrhea or loose stools.     nitroGLYCERIN (NITROSTAT) 0.4 MG SL tablet Place 1 tablet (0.4 mg total) under the tongue every 5 (five) minutes as needed for chest pain. 50 tablet 1   OneTouch Delica Lancets 29H MISC USE TO CHECK BLOOD SUGAR DAILY AND PRN 100 each 4   ONETOUCH ULTRA test strip USE TO CHECK BLOOD SUGAR ONCE DAILY E11.9 100 strip 4   rosuvastatin (CRESTOR) 10 MG tablet TAKE 1 TABLET BY MOUTH EVERY DAY 90 tablet 1   tizanidine (ZANAFLEX) 2 MG capsule TAKE 4 CAPSULES (8 MG TOTAL) BY MOUTH AT BEDTIME AS NEEDED FOR MUSCLE SPASMS. 360 capsule 1   valsartan (DIOVAN) 80 MG tablet Take 1 tablet (80 mg total) by mouth daily. 90 tablet 3   Current Facility-Administered Medications on File Prior to Visit  Medication Dose Route Frequency Provider Last Rate Last Admin   triamcinolone acetonide (KENALOG-40) injection 40 mg  40 mg Intramuscular Once [name], [name], MD       Past Medical History:  Diagnosis Date   BACK PAIN, CHRONIC 04/18/2009   BENIGN PROSTATIC HYPERTROPHY, HX OF 01/19/2007   Carpal tunnel syndrome 03/24/2007   CORONARY ARTERY DISEASE 01/19/2007   DEPRESSION, CHRONIC 06/24/2007   DIABETES MELLITUS 01/19/2007   DIABETES MELLITUS, TYPE II, WITH NEUROLOGICAL COMPLICATIONS 06/18/1694    Diarrhea 02/23/2007   HEART MURMUR, SYSTOLIC 7/89/3810   HERPES SIMPLEX INFECTION 01/19/2007   HYPERLIPIDEMIA 01/19/2007   HYPERTENSION 01/19/2007   Intermediate coronary syndrome ([place]) 08/29/2008   LEG CRAMPS, NOCTURNAL 10/28/2007   Leg cramps, sleep related 08/05/2010   OBSTRUCTIVE SLEEP APNEA 01/19/2007   does not use CPAP   OSTEOARTHRITIS 06/24/2007   Other postprocedural status(V45.89) 01/19/2007   PERCUTANEOUS TRANSLUMINAL CORONARY ANGIOPLASTY, HX OF 01/19/2007   PLANTAR FASCIITIS, RIGHT 01/19/2007   SHOULDER PAIN, LEFT 03/24/2007   SPINAL STENOSIS, LUMBAR 06/03/2009   TINEA PEDIS 11/15/2009   URI 01/11/2009   No Known Allergies  Social History   Socioeconomic History   Marital status: Married    Spouse name: Not on file   Number of children: Not on  file   Years of education: Not on file   Highest education level: 12th grade  Occupational History   Occupation: retired    Comment: Truck Research officer, political party  Tobacco Use   Smoking status: Former    Types: Cigarettes    Quit date: 04/14/1983    Years since quitting: 38.9   Smokeless tobacco: Never   Tobacco comments:    stopped 1985  Vaping Use   Vaping Use: Never used  Substance and Sexual Activity   Alcohol use: Yes    Comment: OCCASSIONAL   Drug use: Not Currently    Types: Marijuana    Comment: occ   Sexual activity: Not on file  Other Topics Concern   Not on file  Social History Narrative   Married in 1959 - 34yr, divorced; married 1974 - 1.5 years, divorced; married 1985 1 daughter 144 [id number] daughters; 2 grandchildren.   Social Determinants of Health   Financial Resource Strain: Low Risk  (02/16/2022)   Overall Financial Resource Strain (CARDIA)    Difficulty of Paying Living Expenses: Not hard at all  Food Insecurity: No Food Insecurity (02/16/2022)   Hunger Vital Sign    Worried About Running Out of Food in the Last Year: Never true    Ran Out of Food in the Last Year: Never true  Transportation Needs: No  Transportation Needs (02/16/2022)   PRAPARE - THydrologist(Medical): No    Lack of Transportation (Non-Medical): No  Physical Activity: Insufficiently Active (02/16/2022)   Exercise Vital Sign    Days of Exercise per Week: 1 day    Minutes of Exercise per Session: 10 min  Stress: Stress Concern Present (02/16/2022)   FGrant   Feeling of Stress : To some extent  Social Connections: Unknown (02/16/2022)   Social Connection and Isolation Panel [NHANES]    Frequency of Communication with Friends and Family: Patient refused    Frequency of Social Gatherings with Friends and Family: Once a week    Attends Religious Services: Never    AMarine scientistor Organizations: No    Attends CMusic therapist Never    Marital Status: Married  Recent Concern: Social Connections - Moderately Isolated (01/28/2022)   Social Connection and Isolation Panel [NHANES]    Frequency of Communication with Friends and Family: More than three times a week    Frequency of Social Gatherings with Friends and Family: More than three times a week    Attends Religious Services: Never    AMarine scientistor Organizations: No    Attends CArchivistMeetings: Never    Marital Status: Married   Vitals:   03/23/22 1401  BP: 136/70  Pulse: 83  Resp: 16  SpO2: 97%   Body mass index is 28.76 kg/m. Physical Exam Vitals and nursing note reviewed.  Constitutional:      General: He is not in acute distress.    Appearance: He is well-developed.  HENT:     Head: Normocephalic and atraumatic.     Mouth/Throat:     Mouth: Mucous membranes are moist.     Pharynx: Oropharynx is clear.  Eyes:     Conjunctiva/sclera: Conjunctivae normal.  Cardiovascular:     Rate and Rhythm: Normal rate and regular rhythm.     Pulses:          Dorsalis pedis pulses are 2+ on the right  side and 2+ on the left  side.     Heart sounds: Murmur (Dyastolic I-II/VI RUSB, SEM I/VI LUSB) heard.  Pulmonary:     Effort: Pulmonary effort is normal. No respiratory distress.     Breath sounds: Normal breath sounds.  Abdominal:     Palpations: Abdomen is soft. There is no hepatomegaly or mass.     Tenderness: There is no abdominal tenderness.  Musculoskeletal:     Right lower leg: 1+ Pitting Edema present.     Left lower leg: 1+ Pitting Edema present.  Lymphadenopathy:     Cervical: No cervical adenopathy.  Skin:    General: Skin is warm.     Findings: No erythema or rash.  Neurological:     General: No focal deficit present.     Mental Status: He is alert and oriented to person, place, and time.     Comments: Antalgic gait assisted with a cane.  Psychiatric:        Mood and Affect: Mood and affect normal.   ASSESSMENT AND PLAN:  Mr.Kenley was seen today for follow-up.  Diagnoses and all orders for this visit: Lab Results  Component Value Date   CREATININE 0.78 03/23/2022   BUN 22 03/23/2022   NA 142 03/23/2022   K 4.4 03/23/2022   CL 105 03/23/2022   CO2 29 03/23/2022   Type 2 diabetes mellitus with diabetic neuropathy, without long-term current use of insulin ([place]) Assessment & Plan: HgA1C has been at goal, 6.7 in 02/2022. Forgetting his Glipizide sometimes. I would like to try Charlean Merl has been recommended in the past but cost was an issue. We discussed benefits, he will find out about coverage.  Orders: -     Basic metabolic panel; Future  Generalized osteoarthritis of multiple sites Assessment & Plan: He prefers to continue with Tylenol, he can take 500 mg 3-4 times per day. Fall precautions.  Orders: -     Basic metabolic panel; Future  Hypertension with heart disease Assessment & Plan: BP adequately controlled. Continue current management: Valsartan 80 mg daily, Imdur 30 mg daily,and low salt diet. Monitor BP at home.   Insomnia, unspecified type Assessment &  Plan: Prefers not to continue Doxepin, which I agree. Continue adequate sleep hygiene.   Peripheral polyneuropathy Assessment & Plan: Pulses are palpable. Educated about the importance of adequate foot care and glucose control.   Cramp of both lower extremities Assessment & Plan: Otherwise stable, not changed since he ran out of Requip, so discontinue. Continue Zanaflex 6 mg tid as needed to resume. Side effects discussed. Continue adequate hydration. Caution with alcohol intake. Tonic water at night seems to help.   I spent a total of 42 minutes in both face to face and non face to face activities for this visit on the date of this encounter. During this time history was obtained and documented, examination was performed, prior labs reviewed, and assessment/plan discussed.  Return in about 5 months (around 08/22/2022).  [name]. [name], MD  [facility] ([facility]). [place] office.

## 2022-03-23 ENCOUNTER — Ambulatory Visit (INDEPENDENT_AMBULATORY_CARE_PROVIDER_SITE_OTHER): Payer: Medicare HMO | Admitting: Family Medicine

## 2022-03-23 ENCOUNTER — Encounter: Payer: Self-pay | Admitting: Family Medicine

## 2022-03-23 ENCOUNTER — Telehealth: Payer: Self-pay | Admitting: Pharmacist

## 2022-03-23 VITALS — BP 136/70 | HR 83 | Resp 16 | Ht 63.0 in | Wt 162.4 lb

## 2022-03-23 DIAGNOSIS — I119 Hypertensive heart disease without heart failure: Secondary | ICD-10-CM | POA: Diagnosis not present

## 2022-03-23 DIAGNOSIS — E1140 Type 2 diabetes mellitus with diabetic neuropathy, unspecified: Secondary | ICD-10-CM

## 2022-03-23 DIAGNOSIS — G4700 Insomnia, unspecified: Secondary | ICD-10-CM

## 2022-03-23 DIAGNOSIS — M159 Polyosteoarthritis, unspecified: Secondary | ICD-10-CM

## 2022-03-23 DIAGNOSIS — R252 Cramp and spasm: Secondary | ICD-10-CM

## 2022-03-23 DIAGNOSIS — G629 Polyneuropathy, unspecified: Secondary | ICD-10-CM

## 2022-03-23 LAB — BASIC METABOLIC PANEL
BUN: 22 mg/dL (ref 6–23)
CO2: 29 mEq/L (ref 19–32)
Calcium: 9.5 mg/dL (ref 8.4–10.5)
Chloride: 105 mEq/L (ref 96–112)
Creatinine, Ser: 0.78 mg/dL (ref 0.40–1.50)
GFR: 79.86 mL/min (ref >60.00)
Glucose, Bld: 172 mg/dL — ABNORMAL HIGH (ref 70–99)
Potassium: 4.4 mEq/L (ref 3.5–5.1)
Sodium: 142 mEq/L (ref 135–145)

## 2022-03-23 NOTE — Assessment & Plan Note (Addendum)
BP adequately controlled. Continue current management: Valsartan 80 mg daily, Imdur 30 mg daily,and low salt diet. Monitor BP at home.

## 2022-03-23 NOTE — Patient Instructions (Addendum)
A few things to remember from today's visit:  Type 2 diabetes mellitus with diabetic neuropathy, without long-term current use of insulin ([place]) - Plan: Basic metabolic panel  Generalized osteoarthritis of multiple sites - Plan: Basic metabolic panel  Hypertension with heart disease  Today we are discontinuing Doxepin, Requip,and Hydrocodone.  I would like to try Jardiance for diabetes, please let me know if your insurance will cover it.  If you need refills for medications you take chronically, please call your pharmacy. Do not use My Chart to request refills or for acute issues that need immediate attention. If you send a my chart message, it may take a few days to be addressed, specially if I am not in the office.  Please be sure medication list is accurate. If a new problem present, please set up appointment sooner than planned today.

## 2022-03-23 NOTE — Chronic Care Management (AMB) (Signed)
Chronic Care Management Pharmacy Assistant   Name: [name]  MRN: [id number] DOB: Sep 06, 1931  Reason for Encounter: Disease State / General Assessment   Recent office visits:  02/23/22 [name], [name], MD - Patient presented for Type 2 diabetes mellitus with diabetic neuropathy without long term current use of insulin and other concerns. Prescribed Doxepin and Hydrocodone- Acetaminophen.  01/28/22 Andrew Peaches, LPN - Patient presented for Medicare Annual Wellness exam. No medication changes. Patient voiced goal of staying healthy.   Recent consult visits:  None   Hospital visits:  None in previous 6 months  Medications: Outpatient Encounter Medications as of 03/23/2022  Medication Sig   acetaminophen (TYLENOL) 500 MG tablet Take 500 mg by mouth every 6 (six) hours as needed.   albuterol (VENTOLIN HFA) 108 (90 Base) MCG/ACT inhaler Inhale 2 puffs into the lungs every 6 (six) hours as needed. (Patient taking differently: Inhale 2 puffs into the lungs every 6 (six) hours as needed for wheezing or shortness of breath.)   alfuzosin (UROXATRAL) 10 MG 24 hr tablet Take 1 tablet (10 mg total) by mouth daily with breakfast.   Blood Glucose Monitoring Suppl (ACCU-CHEK AVIVA CONNECT) w/Device KIT 1 Device by Does not apply route daily.   Blood Glucose Monitoring Suppl (ONE TOUCH ULTRA 2) w/Device KIT Daily as directed.   Cyanocobalamin (B-12) 1000 MCG TABS Take 1,000 mcg by mouth daily.   doxepin (SINEQUAN) 10 MG/ML solution Take 0.3-1 mLs (3-10 mg total) by mouth at bedtime.   finasteride (PROSCAR) 5 MG tablet TAKE 1 TABLET BY MOUTH EVERY DAY   fluticasone (FLONASE) 50 MCG/ACT nasal spray PLACE 1 SPRAY INTO BOTH NOSTRILS 2 (TWO) TIMES DAILY   furosemide (LASIX) 40 MG tablet TAKE 1 TABLET BY MOUTH TWICE A DAY   glipiZIDE (GLUCOTROL) 5 MG tablet TAKE 1 TABLET (5 MG TOTAL) BY MOUTH DAILY BEFORE BREAKFAST. 20-30 MINUTES BEFORE MEAL.   HYDROcodone-acetaminophen (NORCO/VICODIN) 5-325 MG  tablet Take 1 tablet by mouth daily as needed for moderate pain.   isosorbide mononitrate (IMDUR) 30 MG 24 hr tablet Take 1 tablet (30 mg total) by mouth daily.   Lancet Devices (LANCET DEVICE WITH EJECTOR) MISC 1 Device by Does not apply route daily.   loperamide (IMODIUM A-D) 2 MG tablet Take 2 mg by mouth 4 (four) times daily as needed for diarrhea or loose stools.   nitroGLYCERIN (NITROSTAT) 0.4 MG SL tablet Place 1 tablet (0.4 mg total) under the tongue every 5 (five) minutes as needed for chest pain.   OneTouch Delica Lancets 00L MISC USE TO CHECK BLOOD SUGAR DAILY AND PRN   ONETOUCH ULTRA test strip USE TO CHECK BLOOD SUGAR ONCE DAILY E11.9   rOPINIRole (REQUIP) 0.5 MG tablet TAKE 1 TABLET BY MOUTH EVERYDAY AT BEDTIME   rosuvastatin (CRESTOR) 10 MG tablet TAKE 1 TABLET BY MOUTH EVERY DAY   tizanidine (ZANAFLEX) 2 MG capsule TAKE 4 CAPSULES (8 MG TOTAL) BY MOUTH AT BEDTIME AS NEEDED FOR MUSCLE SPASMS.   valsartan (DIOVAN) 80 MG tablet Take 1 tablet (80 mg total) by mouth daily.   Facility-Administered Encounter Medications as of 03/23/2022  Medication   triamcinolone acetonide (KENALOG-40) injection 40 mg   [place] for General Review Call  Adherence Review:  Does the Clinical Pharmacist Assistant have access to adherence rates? Yes Adherence rates for STAR metric medications  Glipizide 5 mg - Last [facility]/09/22 [facility] Glipizide 5 mg - Last filled 11/14/21 90 [facility] Valsartan 80 mg - Last filled [facility]/26/22 [facility] Valsartan 80 mg - Last filled 10/27/21 [facility] Rosuvastatin 10 mg -  Last filled [facility]/19/22 [facility] Rosuvastatin 10 mg-  [facility]/16/21 [facility]  Does the patient have >5 day gap between last estimated fill dates for any of the above medications or other medication gaps? No  Disease State Questions:  Able to connect with Patient? Spoke to patient's wife. Did patient have any problems with their health recently? No  Have you  had any admissions or emergency room visits or worsening of your condition(s) since last visit? No  Have you had any visits with new specialists or providers since your last visit? No  Have you had any new health care problem(s) since your last visit? No : Have you run out of any of your medications since you last spoke with clinical pharmacist? No  Are there any medications you are not taking as prescribed?  No per wife Are you having any issues or side effects with your medications? No  Do you have any other health concerns or questions you want to discuss with your Clinical Pharmacist before your next visit? No  Are there any health concerns that you feel we can do a better job addressing? No  . Additional Details? Wife reports he has been ok and he is expected to follow up with his PCP on today. She reports no questions or concerns at this time.     Care Gaps: TDAP - Overdue Foot Exam - Overdue Eye Exam - Overdue Zoster Vaccine - Postponed PA Vaccine - Postponed AWV- 02/07/22 BP-132/80 02/23/22 Lab Results  Component Value Date   HGBA1C 6.7 02/23/2022    Star Rating Drugs:  Glipizide 5 mg - Last filled [facility]/09/22 [facility] Glipizide 5 mg - Last filled 11/14/21 [facility] Valsartan 80 mg - Last filled 01/26/22 [facility] Valsartan 80 mg - Last filled 10/27/21 [facility] Rosuvastatin 10 mg -  Last filled 03/19/22 90 DS at [facility] Pharmacist Assistant [phone]

## 2022-03-23 NOTE — Assessment & Plan Note (Signed)
Pulses are palpable. Educated about the importance of adequate foot care and glucose control.

## 2022-03-23 NOTE — Assessment & Plan Note (Signed)
Otherwise stable, not changed since he ran out of Requip, so discontinue. Continue Zanaflex 6 mg tid as needed to resume. Side effects discussed. Continue adequate hydration. Caution with alcohol intake. Tonic water at night seems to help.

## 2022-03-23 NOTE — Assessment & Plan Note (Signed)
Prefers not to continue Doxepin, which I agree. Continue adequate sleep hygiene.

## 2022-03-23 NOTE — Assessment & Plan Note (Signed)
He prefers to continue with Tylenol, he can take 500 mg 3-4 times per day. Fall precautions.

## 2022-03-23 NOTE — Assessment & Plan Note (Addendum)
HgA1C has been at goal, 6.7 in 02/2022. Forgetting his Glipizide sometimes. I would like to try Charlean Merl has been recommended in the past but cost was an issue. We discussed benefits, he will find out about coverage.

## 2022-03-24 ENCOUNTER — Other Ambulatory Visit: Payer: Self-pay | Admitting: Family Medicine

## 2022-03-24 MED ORDER — EMPAGLIFLOZIN 10 MG PO TABS: 10.0000 mg | ORAL_TABLET | Freq: Every day | ORAL | 1 refills | Status: DC

## 2022-03-24 NOTE — Addendum Note (Signed)
Addended by: Rodrigo Ran on: 03/24/2022 04:57 PM   Modules accepted: Orders

## 2022-04-01 ENCOUNTER — Other Ambulatory Visit: Payer: Self-pay | Admitting: Family Medicine

## 2022-04-01 DIAGNOSIS — R252 Cramp and spasm: Secondary | ICD-10-CM

## 2022-04-15 ENCOUNTER — Encounter: Payer: Self-pay | Admitting: Family Medicine

## 2022-04-15 ENCOUNTER — Telehealth (INDEPENDENT_AMBULATORY_CARE_PROVIDER_SITE_OTHER): Payer: Medicare HMO | Admitting: Family Medicine

## 2022-04-15 VITALS — Ht 63.0 in

## 2022-04-15 DIAGNOSIS — J988 Other specified respiratory disorders: Secondary | ICD-10-CM | POA: Diagnosis not present

## 2022-04-15 DIAGNOSIS — R059 Cough, unspecified: Secondary | ICD-10-CM | POA: Diagnosis not present

## 2022-04-15 MED ORDER — BENZONATATE 100 MG PO CAPS: 200.0000 mg | ORAL_CAPSULE | Freq: Two times a day (BID) | ORAL | 0 refills | Status: AC | PRN

## 2022-04-15 NOTE — Progress Notes (Unsigned)
Virtual Visit via Video Note I connected with [name] on 04/16/22 by a video enabled telemedicine application and verified that I am speaking with the correct person using two identifiers. Location patient: home Location provider:work office Persons participating in the virtual visit: patient, provider  I discussed the limitations of evaluation and management by telemedicine and the availability of in person appointments. The patient expressed understanding and agreed to proceed.  Chief Complaint  Patient presents with   URI   HPI: [name] is a 89 yo m ale with PMHx significant for DM II,HLD,OSA,HTN,chronic diarrhea,chronic pain,COPD,and peripheral neuropathy c/o  persistent cough and respiratory symptoms for the past three weeks. He reports waking up with a sore throat the day after his last visit and has been coughing since then. The patient has not taken his temperature and is unsure if he has had a fever.  He describes a "rattling" noise when he coughs , improved after coughing up sputum. Cough worsens when lying down at night.Negative for hemoptysis.  + Sinus pressure,nasal congestion,and a rhinorrhea. He believes his symptoms are slowly improving and mentions that his wife has had similar symptoms.  He has been taking over-the-counter adult Tussin cough and chest congestion medication, which he reports has been helpful in managing his cough.  Negative for severe headache, sore throat, wheezing,SOB,abdominal pain,N/V, changes in bowel movements,or skin rash. States that in general he feels better and does not feel sick.  He experienced severe back pain at one point, lower back pain, bilateral. Pain has improved, worse in the morning when he gets up. It is not radiated. No recent injury. Negative for saddle anesthesia.  ROS: See pertinent positives and negatives per HPI.  Past Medical History:  Diagnosis Date   BACK PAIN, CHRONIC 04/18/2009   BENIGN PROSTATIC HYPERTROPHY, HX  OF 01/19/2007   Carpal tunnel syndrome 03/24/2007   CORONARY ARTERY DISEASE 01/19/2007   DEPRESSION, CHRONIC 06/24/2007   DIABETES MELLITUS 01/19/2007   DIABETES MELLITUS, TYPE II, WITH NEUROLOGICAL COMPLICATIONS 08/12/4816   Diarrhea 02/23/2007   HEART MURMUR, SYSTOLIC 5/90/9311   HERPES SIMPLEX INFECTION 01/19/2007   HYPERLIPIDEMIA 01/19/2007   HYPERTENSION 01/19/2007   Intermediate coronary syndrome ([place]) 08/29/2008   LEG CRAMPS, NOCTURNAL 10/28/2007   Leg cramps, sleep related 08/05/2010   OBSTRUCTIVE SLEEP APNEA 01/19/2007   does not use CPAP   OSTEOARTHRITIS 06/24/2007   Other postprocedural status(V45.89) 01/19/2007   PERCUTANEOUS TRANSLUMINAL CORONARY ANGIOPLASTY, HX OF 01/19/2007   PLANTAR FASCIITIS, RIGHT 01/19/2007   SHOULDER PAIN, LEFT 03/24/2007   SPINAL STENOSIS, LUMBAR 06/03/2009   TINEA PEDIS 11/15/2009   URI 01/11/2009    Past Surgical History:  Procedure Laterality Date   arthroscopy, knee of hx     CARDIAC CATHETERIZATION     CATARACT EXTRACTION Left    COLONOSCOPY     inguinal herniorrhapy, hx     NASAL SINUS SURGERY     percutaneous transluminal coronary angioplasty, hx of  2004   Dr. Lyndel Safe   plastic joint in thumb     PTCA/stent  2004   TONSILLECTOMY     VASECTOMY      Family History  Problem Relation Age of Onset   Arthritis Sister    Diabetes Other    Coronary artery disease Other     Social History   Socioeconomic History   Marital status: Married    Spouse name: Not on file   Number of children: Not on file   Years of education: Not on file  Highest education level: 12th grade  Occupational History   Occupation: retired    Comment: Truck Research officer, political party  Tobacco Use   Smoking status: Former    Types: Cigarettes    Quit date: 04/14/1983    Years since quitting: 39.0   Smokeless tobacco: Never   Tobacco comments:    stopped 1985  Vaping Use   Vaping Use: Never used  Substance and Sexual Activity   Alcohol use: Yes    Comment: OCCASSIONAL   Drug  use: Not Currently    Types: Marijuana    Comment: occ   Sexual activity: Not on file  Other Topics Concern   Not on file  Social History Narrative   Married in 1959 - 12yr, divorced; married 1974 - 1.5 years, divorced; married 1985 1 daughter [phone] daughters; 2 grandchildren.   Social Determinants of Health   Financial Resource Strain: Low Risk  (02/16/2022)   Overall Financial Resource Strain (CARDIA)    Difficulty of Paying Living Expenses: Not hard at all  Food Insecurity: No Food Insecurity (02/16/2022)   Hunger Vital Sign    Worried About Running Out of Food in the Last Year: Never true    Ran Out of Food in the Last Year: Never true  Transportation Needs: No Transportation Needs (02/16/2022)   PRAPARE - THydrologist(Medical): No    Lack of Transportation (Non-Medical): No  Physical Activity: Insufficiently Active (02/16/2022)   Exercise Vital Sign    Days of Exercise per Week: 1 day    Minutes of Exercise per Session: 10 min  Stress: Stress Concern Present (02/16/2022)   [place]   Feeling of Stress : To some extent  Social Connections: Unknown (02/16/2022)   Social Connection and Isolation Panel [NHANES]    Frequency of Communication with Friends and Family: Patient refused    Frequency of Social Gatherings with Friends and Family: Once a week    Attends Religious Services: Never    AMarine scientistor Organizations: No    Attends CMusic therapist Never    Marital Status: Married  Recent Concern: Social Connections - Moderately Isolated (01/28/2022)   Social Connection and Isolation Panel [NHANES]    Frequency of Communication with Friends and Family: More than three times a week    Frequency of Social Gatherings with Friends and Family: More than three times a week    Attends Religious Services: Never    AMarine scientistor Organizations:  No    Attends CArchivistMeetings: Never    Marital Status: Married  IHuman resources officerViolence: Not At Risk (01/28/2022)   Humiliation, Afraid, Rape, and Kick questionnaire    Fear of Current or Ex-Partner: No    Emotionally Abused: No    Physically Abused: No    Sexually Abused: No     Current Outpatient Medications:    acetaminophen (TYLENOL) 500 MG tablet, Take 500 mg by mouth every 6 (six) hours as needed., Disp: , Rfl:    albuterol (VENTOLIN HFA) 108 (90 Base) MCG/ACT inhaler, Inhale 2 puffs into the lungs every 6 (six) hours as needed. (Patient taking differently: Inhale 2 puffs into the lungs every 6 (six) hours as needed for wheezing or shortness of breath.), Disp: 18 g, Rfl: 3   alfuzosin (UROXATRAL) 10 MG 24 hr tablet, Take 1 tablet (10 mg total) by mouth daily with breakfast., Disp:  90 tablet, Rfl: 2   benzonatate (TESSALON) 100 MG capsule, Take 2 capsules (200 mg total) by mouth 2 (two) times daily as needed for up to 10 days., Disp: 30 capsule, Rfl: 0   Blood Glucose Monitoring Suppl (ACCU-CHEK AVIVA CONNECT) w/Device KIT, 1 Device by Does not apply route daily., Disp: 1 kit, Rfl: 0   Blood Glucose Monitoring Suppl (ONE TOUCH ULTRA 2) w/Device KIT, Daily as directed., Disp: 1 kit, Rfl: 0   Cyanocobalamin (B-12) 1000 MCG TABS, Take 1,000 mcg by mouth daily., Disp: , Rfl:    empagliflozin (JARDIANCE) 10 MG TABS tablet, Take 1 tablet (10 mg total) by mouth daily before breakfast., Disp: 90 tablet, Rfl: 1   finasteride (PROSCAR) 5 MG tablet, TAKE 1 TABLET BY MOUTH EVERY DAY, Disp: 90 tablet, Rfl: 2   fluticasone (FLONASE) 50 MCG/ACT nasal spray, PLACE 1 SPRAY INTO BOTH NOSTRILS 2 (TWO) TIMES DAILY, Disp: 16 mL, Rfl: 2   furosemide (LASIX) 40 MG tablet, TAKE 1 TABLET BY MOUTH TWICE A DAY, Disp: 180 tablet, Rfl: 1   glipiZIDE (GLUCOTROL) 5 MG tablet, TAKE 1 TABLET (5 MG TOTAL) BY MOUTH DAILY BEFORE BREAKFAST. 20-30 MINUTES BEFORE MEAL., Disp: 90 tablet, Rfl: 1   isosorbide  mononitrate (IMDUR) 30 MG 24 hr tablet, Take 1 tablet (30 mg total) by mouth daily., Disp: 90 tablet, Rfl: 2   Lancet Devices (LANCET DEVICE WITH EJECTOR) MISC, 1 Device by Does not apply route daily., Disp: 1 each, Rfl: 1   loperamide (IMODIUM A-D) 2 MG tablet, Take 2 mg by mouth 4 (four) times daily as needed for diarrhea or loose stools., Disp: , Rfl:    nitroGLYCERIN (NITROSTAT) 0.4 MG SL tablet, Place 1 tablet (0.4 mg total) under the tongue every 5 (five) minutes as needed for chest pain., Disp: 50 tablet, Rfl: 1   OneTouch Delica Lancets 44R MISC, USE TO CHECK BLOOD SUGAR DAILY AND PRN, Disp: 100 each, Rfl: 4   ONETOUCH ULTRA test strip, USE TO CHECK BLOOD SUGAR ONCE DAILY E11.9, Disp: 100 strip, Rfl: 4   rosuvastatin (CRESTOR) 10 MG tablet, TAKE 1 TABLET BY MOUTH EVERY DAY, Disp: 90 tablet, Rfl: 1   tizanidine (ZANAFLEX) 2 MG capsule, TAKE 4 CAPSULES (8 MG TOTAL) BY MOUTH AT BEDTIME AS NEEDED FOR MUSCLE SPASMS., Disp: 360 capsule, Rfl: 1   valsartan (DIOVAN) 80 MG tablet, Take 1 tablet (80 mg total) by mouth daily., Disp: 90 tablet, Rfl: 3  Current Facility-Administered Medications:    triamcinolone acetonide (KENALOG-40) injection 40 mg, 40 mg, Intramuscular, Once, [name], Malka So, MD  EXAM:  VITALS per patient if applicable:Ht <[id number]>_6<\/[id number]>_5  (1.6 m)   BMI 28.76 kg/m   GENERAL: alert, oriented, appears well and in no acute distress  HEENT: atraumatic, conjunctiva clear, no obvious abnormalities on inspection of external nose and ears Nasal congestion.  NECK: normal movements of the head and neck  LUNGS: on inspection no signs of respiratory distress, breathing rate appears normal, no obvious gross SOB, gasping or wheezing Productive cough a few times during visit.  CV: no obvious cyanosis  MS: moves all visible extremities without noticeable abnormality  PSYCH/NEURO: pleasant and cooperative, no obvious depression or anxiety, speech and thought processing grossly intact  ASSESSMENT  AND PLAN:  Discussed the following assessment and plan:  Respiratory tract infection Symptoms suggests a viral etiology, he is reporting that symptoms are gradually improving We discussed options, including continuing symptomatic treatment, try empiric abx treatment,and bring him to the office for  CXR. We reviewed some side effects of abx treatment, including worsening diarrhea. He prefers to hold on CXR for now and agrees with holding on abx treatment. Monitor for signs of complications, including new onset of fever among some, clearly instructed about warning signs. F/U as needed.  Cough, unspecified type I also explained that cough and nasal congestion can last a few days and sometimes weeks. Adequate hydration and plain mucinex recommended.  -     Benzonatate; Take 2 capsules (200 mg total) by mouth 2 (two) times daily as needed for up to 10 days.  Dispense: 30 capsule; Refill: 0  We discussed possible serious and likely etiologies, options for evaluation and workup, limitations of telemedicine visit vs in person visit, treatment, treatment risks and precautions. The patient was advised to call back or seek an in-person evaluation if the symptoms worsen or if the condition fails to improve as anticipated. I discussed the assessment and treatment plan with the patient. The patient was provided an opportunity to ask questions and all were answered. The patient agreed with the plan and demonstrated an understanding of the instructions.  Return if symptoms worsen or fail to improve, for keep next appointment.  Antrell Tipler G. [name], MD  [facility]. [place] office.

## 2022-04-16 ENCOUNTER — Encounter: Payer: Self-pay | Admitting: Family Medicine

## 2022-05-14 ENCOUNTER — Other Ambulatory Visit: Payer: Self-pay | Admitting: Family Medicine

## 2022-05-14 DIAGNOSIS — I25118 Atherosclerotic heart disease of native coronary artery with other forms of angina pectoris: Secondary | ICD-10-CM

## 2022-05-14 DIAGNOSIS — I119 Hypertensive heart disease without heart failure: Secondary | ICD-10-CM

## 2022-06-10 ENCOUNTER — Other Ambulatory Visit: Payer: Self-pay | Admitting: Family Medicine

## 2022-06-15 ENCOUNTER — Encounter: Payer: Self-pay | Admitting: Family Medicine

## 2022-06-15 ENCOUNTER — Ambulatory Visit (INDEPENDENT_AMBULATORY_CARE_PROVIDER_SITE_OTHER): Payer: Medicare HMO | Admitting: Family Medicine

## 2022-06-15 VITALS — BP 136/80 | HR 80 | Resp 16 | Ht 63.0 in | Wt 159.2 lb

## 2022-06-15 DIAGNOSIS — R600 Localized edema: Secondary | ICD-10-CM | POA: Diagnosis not present

## 2022-06-15 DIAGNOSIS — M79672 Pain in left foot: Secondary | ICD-10-CM

## 2022-06-15 DIAGNOSIS — M79605 Pain in left leg: Secondary | ICD-10-CM

## 2022-06-15 DIAGNOSIS — G629 Polyneuropathy, unspecified: Secondary | ICD-10-CM | POA: Diagnosis not present

## 2022-06-15 MED ORDER — HYDROCODONE-ACETAMINOPHEN 5-325 MG PO TABS: 1.0000 | ORAL_TABLET | Freq: Two times a day (BID) | ORAL | 0 refills | Status: AC | PRN

## 2022-06-15 NOTE — Progress Notes (Unsigned)
ACUTE VISIT Chief Complaint  Patient presents with   Foot Pain   Leg Pain   HPI: [name] is a 89 y.o. male, who is here today complaining of *** HPI  Review of Systems See other pertinent positives and negatives in HPI.  Current Outpatient Medications on File Prior to Visit  Medication Sig Dispense Refill   acetaminophen (TYLENOL) 500 MG tablet Take 500 mg by mouth every 6 (six) hours as needed.     albuterol (VENTOLIN HFA) 108 (90 Base) MCG/ACT inhaler Inhale 2 puffs into the lungs every 6 (six) hours as needed. (Patient taking differently: Inhale 2 puffs into the lungs every 6 (six) hours as needed for wheezing or shortness of breath.) 18 g 3   alfuzosin (UROXATRAL) 10 MG 24 hr tablet Take 1 tablet (10 mg total) by mouth daily with breakfast. 90 tablet 2   Blood Glucose Monitoring Suppl (ACCU-CHEK AVIVA CONNECT) w/Device KIT 1 Device by Does not apply route daily. 1 kit 0   Blood Glucose Monitoring Suppl (ONE TOUCH ULTRA 2) w/Device KIT Daily as directed. 1 kit 0   Cyanocobalamin (B-12) 1000 MCG TABS Take 1,000 mcg by mouth daily.     empagliflozin (JARDIANCE) 10 MG TABS tablet Take 1 tablet (10 mg total) by mouth daily before breakfast. 90 tablet 1   finasteride (PROSCAR) 5 MG tablet TAKE 1 TABLET BY MOUTH EVERY DAY 90 tablet 2   fluticasone (FLONASE) 50 MCG/ACT nasal spray PLACE 1 SPRAY INTO BOTH NOSTRILS 2 (TWO) TIMES DAILY 16 mL 2   furosemide (LASIX) 40 MG tablet TAKE 1 TABLET BY MOUTH TWICE A DAY 180 tablet 1   glipiZIDE (GLUCOTROL) 5 MG tablet TAKE 1 TABLET (5 MG TOTAL) BY MOUTH DAILY BEFORE BREAKFAST. 20-30 MINUTES BEFORE MEAL. 90 tablet 1   isosorbide mononitrate (IMDUR) 30 MG 24 hr tablet TAKE 1 TABLET BY MOUTH EVERY DAY 90 tablet 2   Lancet Devices (LANCET DEVICE WITH EJECTOR) MISC 1 Device by Does not apply route daily. 1 each 1   loperamide (IMODIUM A-D) 2 MG tablet Take 2 mg by mouth 4 (four) times daily as needed for diarrhea or loose stools.      nitroGLYCERIN (NITROSTAT) 0.4 MG SL tablet Place 1 tablet (0.4 mg total) under the tongue every 5 (five) minutes as needed for chest pain. 50 tablet 1   OneTouch Delica Lancets 99991111 MISC USE TO CHECK BLOOD SUGAR DAILY AND PRN 100 each 4   ONETOUCH ULTRA test strip USE TO CHECK BLOOD SUGAR ONCE DAILY E11.9 100 strip 4   tizanidine (ZANAFLEX) 2 MG capsule TAKE 4 CAPSULES (8 MG TOTAL) BY MOUTH AT BEDTIME AS NEEDED FOR MUSCLE SPASMS. 360 capsule 1   valsartan (DIOVAN) 80 MG tablet TAKE 1 TABLET BY MOUTH EVERY DAY 90 tablet 3   Current Facility-Administered Medications on File Prior to Visit  Medication Dose Route Frequency Provider Last Rate Last Admin   triamcinolone acetonide (KENALOG-40) injection 40 mg  40 mg Intramuscular Once [name], Mikenna Bunkley G, MD       Past Medical History:  Diagnosis Date   BACK PAIN, CHRONIC 04/18/2009   BENIGN PROSTATIC HYPERTROPHY, HX OF 01/19/2007   Carpal tunnel syndrome 03/24/2007   CORONARY ARTERY DISEASE 01/19/2007   DEPRESSION, CHRONIC 06/24/2007   DIABETES MELLITUS 01/19/2007   DIABETES MELLITUS, TYPE II, WITH NEUROLOGICAL COMPLICATIONS 99991111   Diarrhea 02/23/2007   HEART MURMUR, SYSTOLIC [id number]   HERPES SIMPLEX INFECTION 01/19/2007   HYPERLIPIDEMIA 01/19/2007   HYPERTENSION  01/19/2007   Intermediate coronary syndrome ([place]) 08/29/2008   LEG CRAMPS, NOCTURNAL 10/28/2007   Leg cramps, sleep related 08/05/2010   OBSTRUCTIVE SLEEP APNEA 01/19/2007   does not use CPAP   OSTEOARTHRITIS 06/24/2007   Other postprocedural status(V45.89) 01/19/2007   PERCUTANEOUS TRANSLUMINAL CORONARY ANGIOPLASTY, HX OF 01/19/2007   PLANTAR FASCIITIS, RIGHT 01/19/2007   SHOULDER PAIN, LEFT 03/24/2007   SPINAL STENOSIS, LUMBAR 06/03/2009   TINEA PEDIS 11/15/2009   URI 01/11/2009   No Known Allergies  Social History   Socioeconomic History   Marital status: Married    Spouse name: Not on file   Number of children: Not on file   Years of education: Not on file   Highest education level:  12th grade  Occupational History   Occupation: retired    Comment: Truck Research officer, political party  Tobacco Use   Smoking status: Former    Types: Cigarettes    Quit date: 04/14/1983    Years since quitting: 39.1   Smokeless tobacco: Never   Tobacco comments:    stopped 1985  Vaping Use   Vaping Use: Never used  Substance and Sexual Activity   Alcohol use: Yes    Comment: OCCASSIONAL   Drug use: Not Currently    Types: Marijuana    Comment: occ   Sexual activity: Not on file  Other Topics Concern   Not on file  Social History Narrative   Married in [place] - 60yr, divorced; married 1974 - 1.5 years, divorced; married 1985 1 daughter [phone] daughters; 2 grandchildren.   Social Determinants of Health   Financial Resource Strain: Low Risk  (02/16/2022)   Overall Financial Resource Strain (CARDIA)    Difficulty of Paying Living Expenses: Not hard at all  Food Insecurity: No Food Insecurity (02/16/2022)   Hunger Vital Sign    Worried About Running Out of Food in the Last Year: Never true    Ran Out of Food in the Last Year: Never true  Transportation Needs: No Transportation Needs (02/16/2022)   PRAPARE - THydrologist(Medical): No    Lack of Transportation (Non-Medical): No  Physical Activity: Insufficiently Active (02/16/2022)   Exercise Vital Sign    Days of Exercise per Week: 1 day    Minutes of Exercise per Session: 10 min  Stress: Stress Concern Present (02/16/2022)   [place]   Feeling of Stress : To some extent  Social Connections: Unknown (02/16/2022)   Social Connection and Isolation Panel [NHANES]    Frequency of Communication with Friends and Family: Patient refused    Frequency of Social Gatherings with Friends and Family: Once a week    Attends Religious Services: Never    AMarine scientistor Organizations: No    Attends CMusic therapist Never     Marital Status: Married  Recent Concern: Social Connections - Moderately Isolated (01/28/2022)   Social Connection and Isolation Panel [NHANES]    Frequency of Communication with Friends and Family: More than three times a week    Frequency of Social Gatherings with Friends and Family: More than three times a week    Attends Religious Services: Never    AMarine scientistor Organizations: No    Attends CArchivistMeetings: Never    Marital Status: Married   Vitals:   06/15/22 1523  BP: 136/80  Pulse: 80  Resp: 16  SpO2: 96%  Body mass index is 28.21 kg/m.  Physical Exam Vitals and nursing note reviewed.  Constitutional:      General: He is not in acute distress.    Appearance: He is well-developed.  HENT:     Head: Normocephalic and atraumatic.     Mouth/Throat:     Mouth: Mucous membranes are moist.     Pharynx: Oropharynx is clear.  Eyes:     Conjunctiva/sclera: Conjunctivae normal.  Cardiovascular:     Rate and Rhythm: Normal rate. Rhythm irregular. Occasional Extrasystoles are present.    Pulses:          Dorsalis pedis pulses are 2+ on the right side and 2+ on the left side.     Heart sounds: Murmur (Dyastolic I-II/VI RUSB, SEM I/VI LUSB) heard.  Pulmonary:     Effort: Pulmonary effort is normal. No respiratory distress.     Breath sounds: Normal breath sounds.  Abdominal:     Palpations: Abdomen is soft. There is no hepatomegaly or mass.     Tenderness: There is no abdominal tenderness.  Musculoskeletal:     Left upper leg: Tenderness present.     Right lower leg: 1+ Pitting Edema present.     Left lower leg: 1+ Pitting Edema present.       Legs:       Feet:     Comments: Varicose veins LE's, bilateral.  Lymphadenopathy:     Cervical: No cervical adenopathy.  Skin:    General: Skin is warm.     Findings: No erythema or rash.  Neurological:     General: No focal deficit present.     Mental Status: He is alert and oriented to person,  place, and time.     Comments: Antalgic gait assisted with a cane.  Psychiatric:        Mood and Affect: Mood and affect normal.     ASSESSMENT AND PLAN: There are no diagnoses linked to this encounter.  No follow-ups on file.  Jewel Venditto G. [name], MD  [facility]. [place] office.  Discharge Instructions   None

## 2022-06-15 NOTE — Patient Instructions (Signed)
A few things to remember from today's visit:  Pain of left lower extremity - Plan: Basic metabolic panel  Left foot pain - Plan: DG Foot Complete Left, Basic metabolic panel  ? Sprain muscle inner thigh. X ray of left foot to evaluate for stress fracture. Comfortable shoes. Topical Asper cream with Lidocaine.  If you need refills for medications you take chronically, please call your pharmacy. Do not use My Chart to request refills or for acute issues that need immediate attention. If you send a my chart message, it may take a few days to be addressed, specially if I am not in the office.  Please be sure medication list is accurate. If a new problem present, please set up appointment sooner than planned today.

## 2022-06-16 ENCOUNTER — Other Ambulatory Visit: Payer: Medicare HMO

## 2022-06-17 ENCOUNTER — Ambulatory Visit (INDEPENDENT_AMBULATORY_CARE_PROVIDER_SITE_OTHER)
Admission: RE | Admit: 2022-06-17 | Discharge: 2022-06-17 | Disposition: A | Discharge status: Home / Self Care | Payer: Medicare HMO | Source: Ambulatory Visit | Attending: Family Medicine | Admitting: Family Medicine

## 2022-06-17 ENCOUNTER — Other Ambulatory Visit (INDEPENDENT_AMBULATORY_CARE_PROVIDER_SITE_OTHER): Payer: Medicare HMO

## 2022-06-17 DIAGNOSIS — M79605 Pain in left leg: Secondary | ICD-10-CM

## 2022-06-17 DIAGNOSIS — M79672 Pain in left foot: Secondary | ICD-10-CM

## 2022-06-17 LAB — BASIC METABOLIC PANEL
BUN: 16 mg/dL (ref 6–23)
CO2: 27 mEq/L (ref 19–32)
Calcium: 9.5 mg/dL (ref 8.4–10.5)
Chloride: 103 mEq/L (ref 96–112)
Creatinine, Ser: 0.72 mg/dL (ref 0.40–1.50)
GFR: 81.68 mL/min (ref >60.00)
Glucose, Bld: 120 mg/dL — ABNORMAL HIGH (ref 70–99)
Potassium: 3.8 mEq/L (ref 3.5–5.1)
Sodium: 139 mEq/L (ref 135–145)

## 2022-06-18 ENCOUNTER — Encounter: Payer: Self-pay | Admitting: Family Medicine

## 2022-06-18 NOTE — Assessment & Plan Note (Signed)
No changes from baseline. Pulses are palpable. Educated about the importance of appropriate foot care.

## 2022-06-18 NOTE — Assessment & Plan Note (Signed)
It is stable. Continue Furosemide 40 mg bid prn. LE elevation a few times during the afternoon and good skin care.

## 2022-06-23 ENCOUNTER — Telehealth: Payer: Self-pay

## 2022-06-23 NOTE — Progress Notes (Cosign Needed)
Patient ID: [name], male   DOB: [birth year], 89 y.o.   MRN: [id number] A user error has taken place: encounter opened in error, closed for administrative reasons.

## 2022-06-23 NOTE — Progress Notes (Signed)
Patient ID: [name], male   DOB: [birth year], 89 y.o.   MRN: [id number]  Care Management & Coordination Services Pharmacy Team  Reason for Encounter: General adherence update   Contacted patient for general health update and medication adherence call.  Spoke with patient on 06/23/2022    What concerns do you have about your medications? Patient reports none at this time  The patient denies side effects with their medications.   How often do you forget or accidentally miss a dose? Never  Do you use a pillbox? No  Are you having any problems getting your medications from your pharmacy? No  Has the cost of your medications been a concern? No  The patient has not had an ED visit since last contact.   The patient denies problems with their health. Patient reports he has not had any further foot pain that he desired to note so he had not scheduled any follow up for it. He reports he will call if that changes in the future.  Patient denies concerns or questions for Theo Dills, PharmD at this time.   Counseled patient on: Great job taking medications and Access to carecoordination team for any cost, medication or pharmacy concerns.    Chart Updates:  Recent office visits:  06/15/22 [name], [name], MD - Patient presented for Pain of left lower extremity and other concerns. Prescribed Hydrocodone- Acetaminophen. Stopped Rosuvastatin.   04/15/22 [name], [name], MD - Patient presented via video for respiratory tract infection and other concerns. Prescribed Benzonatate.   03/23/22 [name], [name], MD - Patient presented for Type 2 diabetes mellitus with diabetic neuropathy without long term current use of insulin and other concerns. Stopped Doxepin. Stopped Hydrocodone- Acetaminophen. Stopped Ropinirole.   Recent consult visits:  None  Hospital visits:  None in previous 6 months  Medications: Outpatient Encounter Medications as of 06/23/2022  Medication Sig   acetaminophen  (TYLENOL) 500 MG tablet Take 500 mg by mouth every 6 (six) hours as needed.   albuterol (VENTOLIN HFA) 108 (90 Base) MCG/ACT inhaler Inhale 2 puffs into the lungs every 6 (six) hours as needed. (Patient taking differently: Inhale 2 puffs into the lungs every 6 (six) hours as needed for wheezing or shortness of breath.)   alfuzosin (UROXATRAL) 10 MG 24 hr tablet Take 1 tablet (10 mg total) by mouth daily with breakfast.   Blood Glucose Monitoring Suppl (ACCU-CHEK AVIVA CONNECT) w/Device KIT 1 Device by Does not apply route daily.   Blood Glucose Monitoring Suppl (ONE TOUCH ULTRA 2) w/Device KIT Daily as directed.   Cyanocobalamin (B-12) 1000 MCG TABS Take 1,000 mcg by mouth daily.   empagliflozin (JARDIANCE) 10 MG TABS tablet Take 1 tablet (10 mg total) by mouth daily before breakfast.   finasteride (PROSCAR) 5 MG tablet TAKE 1 TABLET BY MOUTH EVERY DAY   fluticasone (FLONASE) 50 MCG/ACT nasal spray PLACE 1 SPRAY INTO BOTH NOSTRILS 2 (TWO) TIMES DAILY   furosemide (LASIX) 40 MG tablet TAKE 1 TABLET BY MOUTH TWICE A DAY   glipiZIDE (GLUCOTROL) 5 MG tablet TAKE 1 TABLET (5 MG TOTAL) BY MOUTH DAILY BEFORE BREAKFAST. 20-30 MINUTES BEFORE MEAL.   isosorbide mononitrate (IMDUR) 30 MG 24 hr tablet TAKE 1 TABLET BY MOUTH EVERY DAY   Lancet Devices (LANCET DEVICE WITH EJECTOR) MISC 1 Device by Does not apply route daily.   loperamide (IMODIUM A-D) 2 MG tablet Take 2 mg by mouth 4 (four) times daily as needed for diarrhea or loose stools.  nitroGLYCERIN (NITROSTAT) 0.4 MG SL tablet Place 1 tablet (0.4 mg total) under the tongue every 5 (five) minutes as needed for chest pain.   OneTouch Delica Lancets 99991111 MISC USE TO CHECK BLOOD SUGAR DAILY AND PRN   ONETOUCH ULTRA test strip USE TO CHECK BLOOD SUGAR ONCE DAILY E11.9   tizanidine (ZANAFLEX) 2 MG capsule TAKE 4 CAPSULES (8 MG TOTAL) BY MOUTH AT BEDTIME AS NEEDED FOR MUSCLE SPASMS.   valsartan (DIOVAN) 80 MG tablet TAKE 1 TABLET BY MOUTH EVERY DAY    Facility-Administered Encounter Medications as of 06/23/2022  Medication   triamcinolone acetonide (KENALOG-40) injection 40 mg    Recent vitals BP Readings from Last 3 Encounters:  06/15/22 136/80  03/23/22 136/70  02/23/22 132/80   Pulse Readings from Last 3 Encounters:  06/15/22 80  03/23/22 83  02/23/22 67   Wt Readings from Last 3 Encounters:  06/15/22 159 lb 4 oz (72.2 kg)  03/23/22 162 lb 6 oz (73.7 kg)  02/23/22 162 lb 8 oz (73.7 kg)   BMI Readings from Last 3 Encounters:  06/15/22 28.21 kg/m  04/15/22 28.76 kg/m  03/23/22 28.76 kg/m    Recent lab results    Component Value Date/Time   NA 139 06/17/2022 1152   K 3.8 06/17/2022 1152   CL 103 06/17/2022 1152   CO2 27 06/17/2022 1152   GLUCOSE 120 (H) 06/17/2022 1152   BUN 16 06/17/2022 1152   CREATININE 0.72 06/17/2022 1152   CREATININE 0.81 01/15/2017 1555   CALCIUM 9.5 06/17/2022 1152    Lab Results  Component Value Date   CREATININE 0.72 06/17/2022   GFR 81.68 06/17/2022   GFRNONAA >89 08/12/2015   GFRAA >89 08/12/2015   Lab Results  Component Value Date/Time   HGBA1C 6.7 02/23/2022 12:36 PM   HGBA1C 7.0 08/13/2021 03:05 PM   FRUCTOSAMINE 276 12/23/2020 04:05 PM   FRUCTOSAMINE 302 (H) 08/13/2020 12:54 PM   MICROALBUR 3.5 (H) 08/13/2021 04:00 PM   MICROALBUR 5.3 (H) 04/26/2020 01:08 PM    Lab Results  Component Value Date   CHOL 168 12/23/2020   HDL 48.60 12/23/2020   LDLCALC 93 12/23/2020   LDLDIRECT 121.0 04/26/2020   TRIG 132.0 12/23/2020   CHOLHDL 3 12/23/2020    Care Gaps: Zoster Vaccine - Overdue PNA Vaccine - Overdue TDAP - Overdue Eye Exam - Overdue COVID Booster - Overdue AWV - 01/28/22 Pharmacist follow up 09/2022  Star Rating Drugs:  Glipizide 5 mg - Last filled [facility]/15/22 90 [facility] Valsartan 80 mg - Last filled 04/12/22 90 [facility] at [facility] Rosuvastatin 10 mg -  Last filled 03/19/22 90 DS at [place] Pharmacist Assistant [phone]

## 2022-06-24 ENCOUNTER — Other Ambulatory Visit: Payer: Self-pay | Admitting: Family Medicine

## 2022-06-24 DIAGNOSIS — N401 Enlarged prostate with lower urinary tract symptoms: Secondary | ICD-10-CM

## 2022-07-07 DIAGNOSIS — E1122 Type 2 diabetes mellitus with diabetic chronic kidney disease: Secondary | ICD-10-CM | POA: Diagnosis not present

## 2022-07-07 DIAGNOSIS — N400 Enlarged prostate without lower urinary tract symptoms: Secondary | ICD-10-CM | POA: Diagnosis not present

## 2022-07-07 DIAGNOSIS — R32 Unspecified urinary incontinence: Secondary | ICD-10-CM | POA: Diagnosis not present

## 2022-07-07 DIAGNOSIS — I509 Heart failure, unspecified: Secondary | ICD-10-CM | POA: Diagnosis not present

## 2022-07-07 DIAGNOSIS — Z008 Encounter for other general examination: Secondary | ICD-10-CM | POA: Diagnosis not present

## 2022-07-07 DIAGNOSIS — R269 Unspecified abnormalities of gait and mobility: Secondary | ICD-10-CM | POA: Diagnosis not present

## 2022-07-07 DIAGNOSIS — I25119 Atherosclerotic heart disease of native coronary artery with unspecified angina pectoris: Secondary | ICD-10-CM | POA: Diagnosis not present

## 2022-07-07 DIAGNOSIS — K08409 Partial loss of teeth, unspecified cause, unspecified class: Secondary | ICD-10-CM | POA: Diagnosis not present

## 2022-07-07 DIAGNOSIS — M62838 Other muscle spasm: Secondary | ICD-10-CM | POA: Diagnosis not present

## 2022-07-07 DIAGNOSIS — E1142 Type 2 diabetes mellitus with diabetic polyneuropathy: Secondary | ICD-10-CM | POA: Diagnosis not present

## 2022-07-07 DIAGNOSIS — N189 Chronic kidney disease, unspecified: Secondary | ICD-10-CM | POA: Diagnosis not present

## 2022-07-07 DIAGNOSIS — M1990 Unspecified osteoarthritis, unspecified site: Secondary | ICD-10-CM | POA: Diagnosis not present

## 2022-07-07 DIAGNOSIS — N529 Male erectile dysfunction, unspecified: Secondary | ICD-10-CM | POA: Diagnosis not present

## 2022-08-07 ENCOUNTER — Other Ambulatory Visit: Payer: Self-pay | Admitting: Family Medicine

## 2022-08-31 ENCOUNTER — Encounter: Payer: Self-pay | Admitting: Family Medicine

## 2022-08-31 ENCOUNTER — Ambulatory Visit (INDEPENDENT_AMBULATORY_CARE_PROVIDER_SITE_OTHER): Payer: Medicare HMO

## 2022-08-31 ENCOUNTER — Ambulatory Visit (INDEPENDENT_AMBULATORY_CARE_PROVIDER_SITE_OTHER): Payer: Medicare HMO | Admitting: Family Medicine

## 2022-08-31 VITALS — BP 130/78 | HR 60 | Resp 16 | Ht 63.0 in | Wt 159.0 lb

## 2022-08-31 DIAGNOSIS — R0781 Pleurodynia: Secondary | ICD-10-CM | POA: Diagnosis not present

## 2022-08-31 DIAGNOSIS — I499 Cardiac arrhythmia, unspecified: Secondary | ICD-10-CM

## 2022-08-31 DIAGNOSIS — H539 Unspecified visual disturbance: Secondary | ICD-10-CM

## 2022-08-31 DIAGNOSIS — N401 Enlarged prostate with lower urinary tract symptoms: Secondary | ICD-10-CM

## 2022-08-31 DIAGNOSIS — I4891 Unspecified atrial fibrillation: Secondary | ICD-10-CM | POA: Diagnosis not present

## 2022-08-31 DIAGNOSIS — I119 Hypertensive heart disease without heart failure: Secondary | ICD-10-CM | POA: Diagnosis not present

## 2022-08-31 DIAGNOSIS — N138 Other obstructive and reflux uropathy: Secondary | ICD-10-CM

## 2022-08-31 DIAGNOSIS — W19XXXA Unspecified fall, initial encounter: Secondary | ICD-10-CM

## 2022-08-31 LAB — BASIC METABOLIC PANEL
BUN: 15 mg/dL (ref 6–23)
CO2: 28 mEq/L (ref 19–32)
Calcium: 9.8 mg/dL (ref 8.4–10.5)
Chloride: 104 mEq/L (ref 96–112)
Creatinine, Ser: 0.76 mg/dL (ref 0.40–1.50)
GFR: 80.24 mL/min (ref >60.00)
Glucose, Bld: 153 mg/dL — ABNORMAL HIGH (ref 70–99)
Potassium: 4.4 mEq/L (ref 3.5–5.1)
Sodium: 142 mEq/L (ref 135–145)

## 2022-08-31 LAB — CBC
HCT: 42.9 % (ref 39.0–52.0)
Hemoglobin: 14.7 g/dL (ref 13.0–17.0)
MCHC: 34.2 g/dL (ref 30.0–36.0)
MCV: 92 fl (ref 78.0–100.0)
Platelets: 187 10*3/uL (ref 150.0–400.0)
RBC: 4.67 Mil/uL (ref 4.22–5.81)
RDW: 13.3 % (ref 11.5–15.5)
WBC: 6.3 10*3/uL (ref 4.0–10.5)

## 2022-08-31 MED ORDER — HYDROCODONE-ACETAMINOPHEN 5-325 MG PO TABS
1.0000 | ORAL_TABLET | Freq: Two times a day (BID) | ORAL | 0 refills | Status: DC | PRN
Start: 2022-08-31 — End: 2023-09-22

## 2022-08-31 MED ORDER — HYDROCODONE-ACETAMINOPHEN 5-325 MG PO TABS
1.0000 | ORAL_TABLET | Freq: Two times a day (BID) | ORAL | 0 refills | Status: DC | PRN
Start: 2022-08-31 — End: 2022-08-31

## 2022-08-31 MED ORDER — APIXABAN 5 MG PO TABS
5.0000 mg | ORAL_TABLET | Freq: Two times a day (BID) | ORAL | 2 refills | Status: DC
Start: 2022-08-31 — End: 2023-09-22

## 2022-08-31 NOTE — Patient Instructions (Addendum)
A few things to remember from today's visit:  Fall, initial encounter - Plan: DG Ribs Unilateral W/Chest Right, HYDROcodone-acetaminophen (NORCO/VICODIN) 5-325 MG tablet, DISCONTINUED: HYDROcodone-acetaminophen (NORCO/VICODIN) 5-325 MG tablet  Irregular heart rate - Plan: EKG 12-Lead, Basic metabolic panel, CBC, TSH  BPH with urinary obstruction - Plan: Ambulatory referral to Urology  Hypertension with heart disease - Plan: Basic metabolic panel, CBC, TSH  Atrial fibrillation, unspecified type (HCC) - Plan: Ambulatory referral to Cardiology  Costal margin pain - Plan: DG Ribs Unilateral W/Chest Right  Tylenol max 2 days when taking hydrocodone-Acetaminophen.  If you need refills for medications you take chronically, please call your pharmacy. Do not use My Chart to request refills or for acute issues that need immediate attention. If you send a my chart message, it may take a few days to be addressed, specially if I am not in the office.  Please be sure medication list is accurate. If a new problem present, please set up appointment sooner than planned today.

## 2022-08-31 NOTE — Progress Notes (Signed)
ACUTE VISIT Chief Complaint  Patient presents with   Genia Hotter on Friday, having pain near ribs on right side.    HPI: [name] is a 89 y.o. male with PMHx significant for DM II, HLD, OSA, HTN, chronic diarrhea, BPH, chronic pain,COPD,and peripheral neuropathy here today with his wife and daughter complaining of right ribs pain that started right after a fall 3 days ago. Hit right rib cage against kitchen counter before landing on the floor, no head trauma. Fall He fell from a height of 3 to 5 ft. He landed on Hard floor. There was no blood loss. Pertinent negatives include no abdominal pain, bowel incontinence, fever, headaches, hearing loss, hematuria, loss of consciousness, nausea or vomiting. He has tried acetaminophen for the symptoms. The treatment provided mild relief.   The pain intensity fluctuates, reaching up to a 6 or 7 out of 10, particularly when performing certain movements like pushing down with his hand or lying down.  He has been self-medicating with Tylenol, taking three 8-hour pills which provided some relief.   Last visit prescription for hydrocodone-acetaminophen 5-325 mg was given to take as needed for joint pain. States that it has helped some, would like something stronger if possible. He ran out of medication, denies side effects.  BPH: He also reports worsening urinary symptoms. He is having difficulty with starting urination, urgency, and episodes of urinary incontinence. He is on Proscar 5 mg daily and Alfuzosin 10 mg daily..  Irregular HR noted on examination today.  Hypertension on valsartan 80 mg daily. In the past he has been on metoprolol, discontinued due to bradycardia. He has not noted frequent/unusual headache, CP, palpitation, dyspnea, worsening edema, PND, orthopnea, or focal neurologic deficit. CAD and diastolic dysfunction. Last visit with cardiologist on 12/10/20, Dr Dulce Sellar. Echo 08/2017: LVEF 60-65% and grade II diastolic  dysfunction.  Lab Results  Component Value Date   CREATININE 0.72 06/17/2022   BUN 16 06/17/2022   NA 139 06/17/2022   K 3.8 06/17/2022   CL 103 06/17/2022   CO2 27 06/17/2022   Lab Results  Component Value Date   WBC 5.8 02/23/2022   HGB 14.7 02/23/2022   HCT 43.7 02/23/2022   MCV 92.3 02/23/2022   PLT 163.0 02/23/2022   Lab Results  Component Value Date   TSH 1.78 02/23/2022   -He also reports a visual disturbance that he describes as seeing "broken glass," which has been occurring more frequently over the past three to four months, although it has been present intermittently for about a year. He last visited with his eye care provider was about 2-3 years ago. He has a known history of glaucoma in his right eye, which was treated with eye drops, he had difficulty applying drops, so was instructed to discontinued.  Review of Systems  Constitutional:  Positive for fatigue. Negative for chills and fever.  HENT:  Negative for mouth sores and sore throat.   Gastrointestinal:  Negative for abdominal pain, bowel incontinence, nausea and vomiting.  Endocrine: Negative for cold intolerance and heat intolerance.  Genitourinary:  Positive for difficulty urinating. Negative for dysuria and hematuria.  Musculoskeletal:  Positive for arthralgias, back pain and gait problem.  Skin:  Negative for rash.  Neurological:  Negative for loss of consciousness, syncope, facial asymmetry and headaches.  Psychiatric/Behavioral:  Negative for behavioral problems and hallucinations.   See other pertinent positives and negatives in HPI.  Current Outpatient Medications on File Prior to Visit  Medication Sig Dispense Refill   acetaminophen (TYLENOL) 500 MG tablet Take 500 mg by mouth every 6 (six) hours as needed.     albuterol (VENTOLIN HFA) 108 (90 Base) MCG/ACT inhaler Inhale 2 puffs into the lungs every 6 (six) hours as needed. (Patient taking differently: Inhale 2 puffs into the lungs every 6 (six)  hours as needed for wheezing or shortness of breath.) 18 g 3   alfuzosin (UROXATRAL) 10 MG 24 hr tablet TAKE 1 TABLET (10 MG TOTAL) BY MOUTH DAILY WITH BREAKFAST. 90 tablet 2   Blood Glucose Monitoring Suppl (ACCU-CHEK AVIVA CONNECT) w/Device KIT 1 Device by Does not apply route daily. 1 kit 0   Blood Glucose Monitoring Suppl (ONE TOUCH ULTRA 2) w/Device KIT Daily as directed. 1 kit 0   Cyanocobalamin (B-12) 1000 MCG TABS Take 1,000 mcg by mouth daily.     empagliflozin (JARDIANCE) 10 MG TABS tablet Take 1 tablet (10 mg total) by mouth daily before breakfast. 90 tablet 1   finasteride (PROSCAR) 5 MG tablet TAKE 1 TABLET BY MOUTH EVERY DAY 90 tablet 2   fluticasone (FLONASE) 50 MCG/ACT nasal spray PLACE 1 SPRAY INTO BOTH NOSTRILS 2 (TWO) TIMES DAILY 16 mL 2   furosemide (LASIX) 40 MG tablet TAKE 1 TABLET BY MOUTH TWICE A DAY 180 tablet 1   glipiZIDE (GLUCOTROL) 5 MG tablet TAKE 1 TABLET BY MOUTH DAILY BEFORE BREAKFAST. 20-30 MINUTES BEFORE MEAL. 90 tablet 1   isosorbide mononitrate (IMDUR) 30 MG 24 hr tablet TAKE 1 TABLET BY MOUTH EVERY DAY 90 tablet 2   Lancet Devices (LANCET DEVICE WITH EJECTOR) MISC 1 Device by Does not apply route daily. 1 each 1   loperamide (IMODIUM A-D) 2 MG tablet Take 2 mg by mouth 4 (four) times daily as needed for diarrhea or loose stools.     nitroGLYCERIN (NITROSTAT) 0.4 MG SL tablet Place 1 tablet (0.4 mg total) under the tongue every 5 (five) minutes as needed for chest pain. 50 tablet 1   OneTouch Delica Lancets 33G MISC USE TO CHECK BLOOD SUGAR DAILY AND PRN 100 each 4   ONETOUCH ULTRA test strip USE TO CHECK BLOOD SUGAR ONCE DAILY E11.9 100 strip 4   tizanidine (ZANAFLEX) 2 MG capsule TAKE 4 CAPSULES (8 MG TOTAL) BY MOUTH AT BEDTIME AS NEEDED FOR MUSCLE SPASMS. 360 capsule 1   valsartan (DIOVAN) 80 MG tablet TAKE 1 TABLET BY MOUTH EVERY DAY 90 tablet 3   Current Facility-Administered Medications on File Prior to Visit  Medication Dose Route Frequency Provider  Last Rate Last Admin   triamcinolone acetonide (KENALOG-40) injection 40 mg  40 mg Intramuscular Once [place], [place] Boccio G, MD        Past Medical History:  Diagnosis Date   BACK PAIN, CHRONIC 04/18/2009   BENIGN PROSTATIC HYPERTROPHY, HX OF 01/19/2007   Carpal tunnel syndrome 03/24/2007   CORONARY ARTERY DISEASE 01/19/2007   DEPRESSION, CHRONIC 06/24/2007   DIABETES MELLITUS 01/19/2007   DIABETES MELLITUS, TYPE II, WITH NEUROLOGICAL COMPLICATIONS 06/17/2010   Diarrhea 02/23/2007   HEART MURMUR, SYSTOLIC 01/02/2008   HERPES SIMPLEX INFECTION 01/19/2007   HYPERLIPIDEMIA 01/19/2007   HYPERTENSION 01/19/2007   Intermediate coronary syndrome (HCC) 08/29/2008   LEG CRAMPS, NOCTURNAL 10/28/2007   Leg cramps, sleep related 08/05/2010   OBSTRUCTIVE SLEEP APNEA 01/19/2007   does not use CPAP   OSTEOARTHRITIS 06/24/2007   Other postprocedural status(V45.89) 01/19/2007   PERCUTANEOUS TRANSLUMINAL CORONARY ANGIOPLASTY, HX OF 01/19/2007   PLANTAR FASCIITIS, RIGHT 01/19/2007  SHOULDER PAIN, LEFT 03/24/2007   SPINAL STENOSIS, LUMBAR 06/03/2009   TINEA PEDIS 11/15/2009   URI 01/11/2009   No Known Allergies  Social History   Socioeconomic History   Marital status: Married    Spouse name: Not on file   Number of children: Not on file   Years of education: Not on file   Highest education level: 12th grade  Occupational History   Occupation: retired    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  Tobacco Use   Smoking status: Former    Types: Cigarettes    Quit date: 04/14/1983    Years since quitting: 39.4   Smokeless tobacco: Never   Tobacco comments:    stopped 1985  Vaping Use   Vaping Use: Never used  Substance and Sexual Activity   Alcohol use: Yes    Comment: OCCASSIONAL   Drug use: Not Currently    Types: Marijuana    Comment: occ   Sexual activity: Not on file  Other Topics Concern   Not on file  Social History Narrative   Married in 1959 - 81yrs, divorced; married 1974 - 1.5 years, divorced; married 1985 1  daughter 67; 66 step daughters; 2 grandchildren.   Social Determinants of Health   Financial Resource Strain: Low Risk  (02/16/2022)   Overall Financial Resource Strain (CARDIA)    Difficulty of Paying Living Expenses: Not hard at all  Food Insecurity: No Food Insecurity (02/16/2022)   Hunger Vital Sign    Worried About Running Out of Food in the Last Year: Never true    Ran Out of Food in the Last Year: Never true  Transportation Needs: No Transportation Needs (02/16/2022)   PRAPARE - Administrator, Civil Service (Medical): No    Lack of Transportation (Non-Medical): No  Physical Activity: Insufficiently Active (02/16/2022)   Exercise Vital Sign    Days of Exercise per Week: 1 day    Minutes of Exercise per Session: 10 min  Stress: Stress Concern Present (02/16/2022)   [employer] of Occupational Health - Occupational Stress Questionnaire    Feeling of Stress : To some extent  Social Connections: Unknown (02/16/2022)   Social Connection and Isolation Panel [NHANES]    Frequency of Communication with Friends and Family: Patient declined    Frequency of Social Gatherings with Friends and Family: Once a week    Attends Religious Services: Never    Database administrator or Organizations: No    Attends Engineer, structural: Never    Marital Status: Married  Recent Concern: Social Connections - Moderately Isolated (01/28/2022)   Social Connection and Isolation Panel [NHANES]    Frequency of Communication with Friends and Family: More than three times a week    Frequency of Social Gatherings with Friends and Family: More than three times a week    Attends Religious Services: Never    Database administrator or Organizations: No    Attends Banker Meetings: Never    Marital Status: Married   Vitals:   08/31/22 1148  BP: 130/78  Pulse: 60  Resp: 16  SpO2: 97%  Body mass index is 28.17 kg/m.  Physical Exam Vitals and nursing note reviewed.   Constitutional:      General: He is not in acute distress.    Appearance: He is well-developed.  HENT:     Head: Normocephalic and atraumatic.  Eyes:     Conjunctiva/sclera: Conjunctivae normal.  Cardiovascular:  Rate and Rhythm: Normal rate. Rhythm irregular.     Heart sounds: Murmur (Dyastolic I-II/VI RUSB, SEM I/VI LUSB) heard.  Pulmonary:     Effort: Pulmonary effort is normal. No respiratory distress.     Breath sounds: Normal breath sounds.  Chest:    Abdominal:     Palpations: Abdomen is soft.     Tenderness: There is no abdominal tenderness.  Musculoskeletal:     Right lower leg: 1+ Pitting Edema present.     Left lower leg: 1+ Pitting Edema present.  Skin:    General: Skin is warm.     Findings: No erythema or rash.  Neurological:     General: No focal deficit present.     Mental Status: He is alert and oriented to person, place, and time.     Comments: Antalgic gait assisted with a cane.  Psychiatric:        Mood and Affect: Mood and affect normal.    ASSESSMENT AND PLAN:  Mr. Cort was seen today after fall with right costal pain and worsening urinary symptoms.  BPH with urinary obstruction Assessment & Plan: Symptoms are progressively getting worse. Currently he is on finasteride 5 mg daily and alfuzosin 10 mg daily. He is interested in exploring the option of self cathing, if appropriate; so would like to see an urologist, he would like a new provider.  Orders: -     Ambulatory referral to Urology  Fall, initial encounter Fall precautions discussed. A few of his comorbidities as well as some of his medications can increase risk for falls.  -     DG Ribs Unilateral W/Chest Right; Future  Costal margin pain Rib fracture suspected.  lung auscultation negative.  We discussed management and possible complications. Rib x-ray ordered today. Instructed about warning signs. Hydrocodone-acetaminophen 5-325 mg twice daily recommended, he can take Tylenol  in between no more than 2 to 3 tablets/day.  CXR/rib X ray right 6th-7th fractures. I do not appreciate pleural effusion. Pending report.  -     DG Ribs Unilateral W/Chest Right; Future -     HYDROcodone-Acetaminophen; Take 1 tablet by mouth every 12 (twelve) hours as needed for moderate pain.  Dispense: 30 tablet; Refill: 0  Irregular heart rate -     EKG 12-Lead -     Basic metabolic panel; Future -     CBC; Future -     TSH; Future  Atrial fibrillation, unspecified type [facility]) Assessment & Plan: EKG done today showed new onset of atrial fib. CHA2DS2/VAS 4 (age,DM II,CAD,HTN). We discussed Dx,prognosis,and treatment options. Reluctant to follow with cardiologist, finally he agrees with referral to discuss the need for further work up. We discussed side effects of Eliquis,will wait for lab results before sending Rx for eliquis 5 mg bid. Instructed about warning signs.  Orders: -     Ambulatory referral to Cardiology  Hypertension with heart disease Assessment & Plan: BP adequately controlled. Continue valsartan 80 mg daily as well as low-salt diet.  Orders: -     Basic metabolic panel; Future -     CBC; Future -     TSH; Future  Visual disturbance We discussed possible etiologies. Problem is chronic and getting worse for the past 3-4 months. Hx of glaucoma, has not seen his eye care provider in 2-3 years.  Instructed to arrange appt with eye care provider.   I spent a total of 52 minutes in both face to face and non face to face  activities for this visit on the date of this encounter. During this time history was obtained and documented, examination was performed, prior labs/imaging reviewed, and assessment/plan discussed. Labs still pending. Eliquis 5 mg to take bid sent to his pharmacy.    Return in about 2 months (around 10/31/2022) for chronic problems.  Cielo Arias G. [place], MD  [facility]. [place] office.

## 2022-08-31 NOTE — Assessment & Plan Note (Signed)
BP adequately controlled. Continue valsartan 80 mg daily as well as low-salt diet.

## 2022-08-31 NOTE — Assessment & Plan Note (Addendum)
EKG done today showed new onset of atrial fib. CHA2DS2/VAS 4 (age,DM II,CAD,HTN). We discussed Dx,prognosis,and treatment options. Reluctant to follow with cardiologist, finally he agrees with referral to discuss the need for further work up. We discussed side effects of Eliquis,will wait for lab results before sending Rx for eliquis 5 mg bid. Instructed about warning signs.

## 2022-08-31 NOTE — Assessment & Plan Note (Addendum)
Symptoms are progressively getting worse. Currently he is on finasteride 5 mg daily and alfuzosin 10 mg daily. He is interested in exploring the option of self cathing, if appropriate; so would like to see an urologist, he would like a new provider.

## 2022-09-01 ENCOUNTER — Encounter: Payer: Self-pay | Admitting: Cardiology

## 2022-09-01 LAB — TSH: TSH: 2.12 u[IU]/mL (ref 0.35–5.50)

## 2022-09-01 NOTE — Telephone Encounter (Signed)
Error

## 2022-09-20 ENCOUNTER — Other Ambulatory Visit: Payer: Self-pay | Admitting: Family Medicine

## 2022-09-20 DIAGNOSIS — R252 Cramp and spasm: Secondary | ICD-10-CM

## 2022-10-27 NOTE — Progress Notes (Unsigned)
HPI: [name] is a 89 y.o. male, who is here today for chronic disease management.  Last seen on 08/31/2022, when he was diagnosed with atrial fibrillation. Eliquis 5 mg twice daily was started. Cardiology referral was placed, states that he received a call with appointment information but he declined. *** Lab Results  Component Value Date   TSH 2.12 08/31/2022   Lab Results  Component Value Date   WBC 6.3 08/31/2022   HGB 14.7 08/31/2022   HCT 42.9 08/31/2022   MCV 92.0 08/31/2022   PLT 187.0 08/31/2022   DM II: He admits to occasionally forgetting to take his Glipizide 5 mg.  He reports blood sugar levels ranging from 70 to over 300.  *** Lab Results  Component Value Date   MICROALBUR 3.5 (H) 08/13/2021   MICROALBUR 5.3 (H) 04/26/2020   HTN: He is on Diovan 80 mg daily and Imdur 30 mg daily. He denies CP, dyspnea, palpitations, orthopnea, PND, or worsening edema.  He is not on Rosuvastatin, willing to resume it. He thought is was aggravating LE cramps/achy pain.  Lab Results  Component Value Date   NA 142 08/31/2022   K 4.4 08/31/2022   CO2 28 08/31/2022   GLUCOSE 153 (H) 08/31/2022   BUN 15 08/31/2022   CREATININE 0.76 08/31/2022   CALCIUM 9.8 08/31/2022   GFR 80.24 08/31/2022   GFRNONAA >89 08/12/2015    Review of Systems See other pertinent positives and negatives in HPI.  Current Outpatient Medications on File Prior to Visit  Medication Sig Dispense Refill   acetaminophen (TYLENOL) 500 MG tablet Take 500 mg by mouth every 6 (six) hours as needed.     albuterol (VENTOLIN HFA) 108 (90 Base) MCG/ACT inhaler Inhale 2 puffs into the lungs every 6 (six) hours as needed. (Patient taking differently: Inhale 2 puffs into the lungs every 6 (six) hours as needed for wheezing or shortness of breath.) 18 g 3   alfuzosin (UROXATRAL) 10 MG 24 hr tablet TAKE 1 TABLET (10 MG TOTAL) BY MOUTH DAILY WITH BREAKFAST. 90 tablet 2   apixaban (ELIQUIS) 5 MG TABS tablet  Take 1 tablet (5 mg total) by mouth 2 (two) times daily. 60 tablet 2   Blood Glucose Monitoring Suppl (ACCU-CHEK AVIVA CONNECT) w/Device KIT 1 Device by Does not apply route daily. 1 kit 0   Blood Glucose Monitoring Suppl (ONE TOUCH ULTRA 2) w/Device KIT Daily as directed. 1 kit 0   Cyanocobalamin (B-12) 1000 MCG TABS Take 1,000 mcg by mouth daily.     finasteride (PROSCAR) 5 MG tablet TAKE 1 TABLET BY MOUTH EVERY DAY 90 tablet 2   furosemide (LASIX) 40 MG tablet TAKE 1 TABLET BY MOUTH TWICE A DAY 180 tablet 1   glipiZIDE (GLUCOTROL) 5 MG tablet TAKE 1 TABLET BY MOUTH DAILY BEFORE BREAKFAST. 20-30 MINUTES BEFORE MEAL. 90 tablet 1   HYDROcodone-acetaminophen (NORCO/VICODIN) 5-325 MG tablet Take 1 tablet by mouth every 12 (twelve) hours as needed for moderate pain. 30 tablet 0   isosorbide mononitrate (IMDUR) 30 MG 24 hr tablet TAKE 1 TABLET BY MOUTH EVERY DAY 90 tablet 2   Lancet Devices (LANCET DEVICE WITH EJECTOR) MISC 1 Device by Does not apply route daily. 1 each 1   loperamide (IMODIUM A-D) 2 MG tablet Take 2 mg by mouth 4 (four) times daily as needed for diarrhea or loose stools.     nitroGLYCERIN (NITROSTAT) 0.4 MG SL tablet Place 1 tablet (0.4 mg total) under the  tongue every 5 (five) minutes as needed for chest pain. 50 tablet 1   OneTouch Delica Lancets 33G MISC USE TO CHECK BLOOD SUGAR DAILY AND PRN 100 each 4   ONETOUCH ULTRA test strip USE TO CHECK BLOOD SUGAR ONCE DAILY E11.9 100 strip 4   tizanidine (ZANAFLEX) 2 MG capsule TAKE 4 CAPSULES (8 MG TOTAL) BY MOUTH AT BEDTIME AS NEEDED FOR MUSCLE SPASMS. 360 capsule 1   valsartan (DIOVAN) 80 MG tablet TAKE 1 TABLET BY MOUTH EVERY DAY 90 tablet 3   Current Facility-Administered Medications on File Prior to Visit  Medication Dose Route Frequency Provider Last Rate Last Admin   triamcinolone acetonide (KENALOG-40) injection 40 mg  40 mg Intramuscular Once [place], Amdrew Oboyle G, MD        Past Medical History:  Diagnosis Date   BACK PAIN,  CHRONIC 04/18/2009   BENIGN PROSTATIC HYPERTROPHY, HX OF 01/19/2007   Carpal tunnel syndrome 03/24/2007   CORONARY ARTERY DISEASE 01/19/2007   DEPRESSION, CHRONIC 06/24/2007   DIABETES MELLITUS 01/19/2007   DIABETES MELLITUS, TYPE II, WITH NEUROLOGICAL COMPLICATIONS 06/17/2010   Diarrhea 02/23/2007   HEART MURMUR, SYSTOLIC 01/02/2008   HERPES SIMPLEX INFECTION 01/19/2007   HYPERLIPIDEMIA 01/19/2007   HYPERTENSION 01/19/2007   Intermediate coronary syndrome (HCC) 08/29/2008   LEG CRAMPS, NOCTURNAL 10/28/2007   Leg cramps, sleep related 08/05/2010   OBSTRUCTIVE SLEEP APNEA 01/19/2007   does not use CPAP   OSTEOARTHRITIS 06/24/2007   Other postprocedural status(V45.89) 01/19/2007   PERCUTANEOUS TRANSLUMINAL CORONARY ANGIOPLASTY, HX OF 01/19/2007   PLANTAR FASCIITIS, RIGHT 01/19/2007   SHOULDER PAIN, LEFT 03/24/2007   SPINAL STENOSIS, LUMBAR 06/03/2009   TINEA PEDIS 11/15/2009   URI 01/11/2009   No Known Allergies  Social History   Socioeconomic History   Marital status: Married    Spouse name: Not on file   Number of children: Not on file   Years of education: Not on file   Highest education level: 12th grade  Occupational History   Occupation: retired    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  Tobacco Use   Smoking status: Former    Current packs/day: 0.00    Types: Cigarettes    Quit date: 04/14/1983    Years since quitting: 39.5   Smokeless tobacco: Never   Tobacco comments:    stopped 1985  Vaping Use   Vaping status: Never Used  Substance and Sexual Activity   Alcohol use: Yes    Comment: OCCASSIONAL   Drug use: Not Currently    Types: Marijuana    Comment: occ   Sexual activity: Not on file  Other Topics Concern   Not on file  Social History Narrative   Married in [place] - 60yrs, divorced; married 1974 - 1.5 years, divorced; married 1985 1 daughter 63; 31 step daughters; 2 grandchildren.   Social Determinants of Health   Financial Resource Strain: Low Risk  (02/16/2022)   Overall  Financial Resource Strain (CARDIA)    Difficulty of Paying Living Expenses: Not hard at all  Food Insecurity: No Food Insecurity (02/16/2022)   Hunger Vital Sign    Worried About Running Out of Food in the Last Year: Never true    Ran Out of Food in the Last Year: Never true  Transportation Needs: No Transportation Needs (02/16/2022)   PRAPARE - Administrator, Civil Service (Medical): No    Lack of Transportation (Non-Medical): No  Physical Activity: Insufficiently Active (02/16/2022)   Exercise Vital Sign    Days of  Exercise per Week: 1 day    Minutes of Exercise per Session: 10 min  Stress: Stress Concern Present (02/16/2022)   [employer] of Occupational Health - Occupational Stress Questionnaire    Feeling of Stress : To some extent  Social Connections: Unknown (02/16/2022)   Social Connection and Isolation Panel [NHANES]    Frequency of Communication with Friends and Family: Patient declined    Frequency of Social Gatherings with Friends and Family: Once a week    Attends Religious Services: Never    Database administrator or Organizations: No    Attends Engineer, structural: Never    Marital Status: Married  Recent Concern: Social Connections - Moderately Isolated (01/28/2022)   Social Connection and Isolation Panel [NHANES]    Frequency of Communication with Friends and Family: More than three times a week    Frequency of Social Gatherings with Friends and Family: More than three times a week    Attends Religious Services: Never    Database administrator or Organizations: No    Attends Banker Meetings: Never    Marital Status: Married    Vitals:   10/28/22 1447  BP: 130/70  Pulse: 81  Resp: 16  Temp: 98.5 F (36.9 C)  SpO2: 94%   Body mass index is 27.52 kg/m.  Physical Exam Vitals and nursing note reviewed.  Constitutional:      General: He is not in acute distress.    Appearance: He is well-developed.  HENT:     Head:  Normocephalic and atraumatic.  Eyes:     Conjunctiva/sclera: Conjunctivae normal.  Cardiovascular:     Rate and Rhythm: Normal rate. Rhythm irregular.     Heart sounds: Murmur (Dyastolic I-II/VI RUSB, SEM I/VI LUSB) heard.  Pulmonary:     Effort: Pulmonary effort is normal. No respiratory distress.     Breath sounds: Normal breath sounds.  Abdominal:     Palpations: Abdomen is soft.     Tenderness: There is no abdominal tenderness.  Musculoskeletal:     Right lower leg: 1+ Pitting Edema present.     Left lower leg: 1+ Pitting Edema present.  Skin:    General: Skin is warm.     Findings: No erythema or rash.  Neurological:     General: No focal deficit present.     Mental Status: He is alert and oriented to person, place, and time.     Comments: Antalgic gait assisted with a cane.  Psychiatric:        Mood and Affect: Mood and affect normal.    ASSESSMENT AND PLAN:  [name] "[name]" was seen today for medical management of chronic issues.  Diagnoses and all orders for this visit: Lab Results  Component Value Date   HGBA1C 6.9 10/28/2022    Atrial fibrillation, unspecified type [facility]) Assessment & Plan: Persistent atrial fibrillation. He refused appointment with cardiologist. We discussed possible complications. Continue Eliquis 5 milligrams twice daily, we discussed some side effects. Instructed about warning signs.   Type 2 diabetes mellitus with diabetic neuropathy, without long-term current use of insulin (HCC) Assessment & Plan: Problem has been well-controlled. Continue glipizide 5 mg daily. Continue adequate for care and annual eye exams. Follow-up in 5 to 6 months.  Orders: -     POCT glycosylated hemoglobin (Hb A1C)  Depression, major, recurrent, moderate (HCC) Assessment & Plan: He has tried different medications in the past, he did not notice significant benefit. He is not  interested in pharmacologic treatment.   Hypertension with heart  disease Assessment & Plan: BP adequately controlled. Continue Imdur 30 mg daily and valsartan 80 mg daily. Continue monitoring BP regularly and following low-salt diet.   Chronic heart failure with preserved ejection fraction [facility]) Assessment & Plan: He could not afford Jardiance. Continue furosemide 40 mg twice daily and low-salt diet.   Coronary artery disease of native artery of native heart with stable angina pectoris [facility]) Assessment & Plan: He has refused cardiology follow-up. Reporting no symptoms. Continue Imdur 30 mg daily. He is not on statin medication, has reported side effects (myalgias). On chronic anticoagulation.         I spent a total of 40 minutes in both face to face and non face to face activities for this visit on the date of this encounter. During this time history was obtained and documented, examination was performed, prior labs/imaging reviewed, and assessment/plan discussed.  Return in about 4 months (around 02/28/2023) for chronic problems.  Marelly Wehrman G. [place], MD  [facility] [place] [facility].

## 2022-10-28 ENCOUNTER — Ambulatory Visit (INDEPENDENT_AMBULATORY_CARE_PROVIDER_SITE_OTHER): Payer: Medicare HMO | Admitting: Family Medicine

## 2022-10-28 ENCOUNTER — Encounter: Payer: Self-pay | Admitting: Family Medicine

## 2022-10-28 VITALS — BP 130/70 | HR 81 | Temp 98.5°F | Resp 16 | Ht 63.0 in | Wt 155.4 lb

## 2022-10-28 DIAGNOSIS — I5032 Chronic diastolic (congestive) heart failure: Secondary | ICD-10-CM

## 2022-10-28 DIAGNOSIS — E1140 Type 2 diabetes mellitus with diabetic neuropathy, unspecified: Secondary | ICD-10-CM | POA: Diagnosis not present

## 2022-10-28 DIAGNOSIS — I119 Hypertensive heart disease without heart failure: Secondary | ICD-10-CM

## 2022-10-28 DIAGNOSIS — I25118 Atherosclerotic heart disease of native coronary artery with other forms of angina pectoris: Secondary | ICD-10-CM

## 2022-10-28 DIAGNOSIS — F331 Major depressive disorder, recurrent, moderate: Secondary | ICD-10-CM

## 2022-10-28 DIAGNOSIS — I4891 Unspecified atrial fibrillation: Secondary | ICD-10-CM | POA: Diagnosis not present

## 2022-10-28 LAB — POCT GLYCOSYLATED HEMOGLOBIN (HGB A1C): HbA1c, POC (controlled diabetic range): 6.9 % (ref 0.0–7.0)

## 2022-10-28 NOTE — Assessment & Plan Note (Signed)
BP adequately controlled. Continue Imdur 30 mg daily and valsartan 80 mg daily. Continue monitoring BP regularly and following low-salt diet.

## 2022-10-28 NOTE — Assessment & Plan Note (Signed)
He could not afford Jardiance. Continue furosemide 40 mg twice daily and low-salt diet.

## 2022-10-28 NOTE — Assessment & Plan Note (Signed)
He has tried different medications in the past, he did not notice significant benefit. He is not interested in pharmacologic treatment.

## 2022-10-28 NOTE — Assessment & Plan Note (Signed)
Problem has been well-controlled. Continue glipizide 5 mg daily. Continue adequate for care and annual eye exams. Follow-up in 5 to 6 months.

## 2022-10-28 NOTE — Patient Instructions (Addendum)
A few things to remember from today's visit:  Hypertension with heart disease  Type 2 diabetes mellitus with diabetic neuropathy, without long-term current use of insulin (HCC) - Plan: POC HgB A1c  Atrial fibrillation, unspecified type (HCC) No changes today.  If you need refills for medications you take chronically, please call your pharmacy. Do not use My Chart to request refills or for acute issues that need immediate attention. If you send a my chart message, it may take a few days to be addressed, specially if I am not in the office.  Please be sure medication list is accurate. If a new problem present, please set up appointment sooner than planned today.

## 2022-10-28 NOTE — Assessment & Plan Note (Signed)
Persistent atrial fibrillation. He refused appointment with cardiologist. We discussed possible complications. Continue Eliquis 5 milligrams twice daily, we discussed some side effects. Instructed about warning signs.

## 2022-10-28 NOTE — Assessment & Plan Note (Signed)
He has refused cardiology follow-up. Reporting no symptoms. Continue Imdur 30 mg daily. He is not on statin medication, has reported side effects (myalgias). On chronic anticoagulation.

## 2022-12-16 ENCOUNTER — Telehealth (INDEPENDENT_AMBULATORY_CARE_PROVIDER_SITE_OTHER): Payer: Medicare HMO | Admitting: Family Medicine

## 2022-12-16 ENCOUNTER — Encounter: Payer: Self-pay | Admitting: Family Medicine

## 2022-12-16 VITALS — Ht 63.0 in

## 2022-12-16 DIAGNOSIS — R0981 Nasal congestion: Secondary | ICD-10-CM | POA: Diagnosis not present

## 2022-12-16 DIAGNOSIS — R5382 Chronic fatigue, unspecified: Secondary | ICD-10-CM

## 2022-12-16 DIAGNOSIS — F331 Major depressive disorder, recurrent, moderate: Secondary | ICD-10-CM

## 2022-12-16 DIAGNOSIS — R0609 Other forms of dyspnea: Secondary | ICD-10-CM | POA: Diagnosis not present

## 2022-12-16 DIAGNOSIS — R519 Headache, unspecified: Secondary | ICD-10-CM

## 2022-12-16 DIAGNOSIS — J449 Chronic obstructive pulmonary disease, unspecified: Secondary | ICD-10-CM

## 2022-12-16 MED ORDER — COMBIVENT RESPIMAT 20-100 MCG/ACT IN AERS
1.0000 | INHALATION_SPRAY | Freq: Four times a day (QID) | RESPIRATORY_TRACT | 1 refills | Status: AC | PRN
Start: 2022-12-16 — End: ?

## 2022-12-16 MED ORDER — FLUTICASONE PROPIONATE 50 MCG/ACT NA SUSP
1.0000 | Freq: Two times a day (BID) | NASAL | 0 refills | Status: DC
Start: 2022-12-16 — End: 2023-02-15

## 2022-12-16 MED ORDER — CITALOPRAM HYDROBROMIDE 10 MG PO TABS
10.0000 mg | ORAL_TABLET | Freq: Every day | ORAL | 3 refills | Status: DC
Start: 2022-12-16 — End: 2024-02-17

## 2022-12-16 NOTE — Progress Notes (Deleted)
HPI:  [name] is a 89 y.o. male, who is here today to follow on recent visit.  Review of Systems See other pertinent positives and negatives in HPI.  Current Outpatient Medications on File Prior to Visit  Medication Sig Dispense Refill   acetaminophen (TYLENOL) 500 MG tablet Take 500 mg by mouth every 6 (six) hours as needed.     alfuzosin (UROXATRAL) 10 MG 24 hr tablet TAKE 1 TABLET (10 MG TOTAL) BY MOUTH DAILY WITH BREAKFAST. 90 tablet 2   apixaban (ELIQUIS) 5 MG TABS tablet Take 1 tablet (5 mg total) by mouth 2 (two) times daily. 60 tablet 2   Blood Glucose Monitoring Suppl (ACCU-CHEK AVIVA CONNECT) w/Device KIT 1 Device by Does not apply route daily. 1 kit 0   Blood Glucose Monitoring Suppl (ONE TOUCH ULTRA 2) w/Device KIT Daily as directed. 1 kit 0   citalopram (CELEXA) 10 MG tablet Take 1 tablet (10 mg total) by mouth daily. 30 tablet 3   Cyanocobalamin (B-12) 1000 MCG TABS Take 1,000 mcg by mouth daily.     finasteride (PROSCAR) 5 MG tablet TAKE 1 TABLET BY MOUTH EVERY DAY 90 tablet 2   fluticasone (FLONASE) 50 MCG/ACT nasal spray Place 1 spray into both nostrils 2 (two) times daily. 16 g 0   furosemide (LASIX) 40 MG tablet TAKE 1 TABLET BY MOUTH TWICE A DAY 180 tablet 1   glipiZIDE (GLUCOTROL) 5 MG tablet TAKE 1 TABLET BY MOUTH DAILY BEFORE BREAKFAST. 20-30 MINUTES BEFORE MEAL. 90 tablet 1   HYDROcodone-acetaminophen (NORCO/VICODIN) 5-325 MG tablet Take 1 tablet by mouth every 12 (twelve) hours as needed for moderate pain. 30 tablet 0   Ipratropium-Albuterol (COMBIVENT RESPIMAT) 20-100 MCG/ACT AERS respimat Inhale 1 puff into the lungs every 6 (six) hours as needed for wheezing. 4 g 1   isosorbide mononitrate (IMDUR) 30 MG 24 hr tablet TAKE 1 TABLET BY MOUTH EVERY DAY 90 tablet 2   Lancet Devices (LANCET DEVICE WITH EJECTOR) MISC 1 Device by Does not apply route daily. 1 each 1   loperamide (IMODIUM A-D) 2 MG tablet Take 2 mg by mouth 4 (four) times daily as needed for  diarrhea or loose stools.     nitroGLYCERIN (NITROSTAT) 0.4 MG SL tablet Place 1 tablet (0.4 mg total) under the tongue every 5 (five) minutes as needed for chest pain. 50 tablet 1   OneTouch Delica Lancets 33G MISC USE TO CHECK BLOOD SUGAR DAILY AND PRN 100 each 4   ONETOUCH ULTRA test strip USE TO CHECK BLOOD SUGAR ONCE DAILY E11.9 100 strip 4   tizanidine (ZANAFLEX) 2 MG capsule TAKE 4 CAPSULES (8 MG TOTAL) BY MOUTH AT BEDTIME AS NEEDED FOR MUSCLE SPASMS. 360 capsule 1   valsartan (DIOVAN) 80 MG tablet TAKE 1 TABLET BY MOUTH EVERY DAY 90 tablet 3   Current Facility-Administered Medications on File Prior to Visit  Medication Dose Route Frequency Provider Last Rate Last Admin   triamcinolone acetonide (KENALOG-40) injection 40 mg  40 mg Intramuscular Once [place], [name], MD        Past Medical History:  Diagnosis Date   BACK PAIN, CHRONIC 04/18/2009   BENIGN PROSTATIC HYPERTROPHY, HX OF 01/19/2007   Carpal tunnel syndrome 03/24/2007   CORONARY ARTERY DISEASE 01/19/2007   DEPRESSION, CHRONIC 06/24/2007   DIABETES MELLITUS 01/19/2007   DIABETES MELLITUS, TYPE II, WITH NEUROLOGICAL COMPLICATIONS 06/17/2010   Diarrhea 02/23/2007   HEART MURMUR, SYSTOLIC 01/02/2008   HERPES SIMPLEX INFECTION 01/19/2007  HYPERLIPIDEMIA 01/19/2007   HYPERTENSION 01/19/2007   Intermediate coronary syndrome (HCC) 08/29/2008   LEG CRAMPS, NOCTURNAL 10/28/2007   Leg cramps, sleep related 08/05/2010   OBSTRUCTIVE SLEEP APNEA 01/19/2007   does not use CPAP   OSTEOARTHRITIS 06/24/2007   Other postprocedural status(V45.89) 01/19/2007   PERCUTANEOUS TRANSLUMINAL CORONARY ANGIOPLASTY, HX OF 01/19/2007   PLANTAR FASCIITIS, RIGHT 01/19/2007   SHOULDER PAIN, LEFT 03/24/2007   SPINAL STENOSIS, LUMBAR 06/03/2009   TINEA PEDIS 11/15/2009   URI 01/11/2009   No Known Allergies  Social History   Socioeconomic History   Marital status: Married    Spouse name: Not on file   Number of children: Not on file   Years of education: Not on  file   Highest education level: 12th grade  Occupational History   Occupation: retired    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  Tobacco Use   Smoking status: Former    Current packs/day: 0.00    Types: Cigarettes    Quit date: 04/14/1983    Years since quitting: 39.7   Smokeless tobacco: Never   Tobacco comments:    stopped 1985  Vaping Use   Vaping status: Never Used  Substance and Sexual Activity   Alcohol use: Yes    Comment: OCCASSIONAL   Drug use: Not Currently    Types: Marijuana    Comment: occ   Sexual activity: Not on file  Other Topics Concern   Not on file  Social History Narrative   Married in [place] - 85yrs, divorced; married 1974 - 1.5 years, divorced; married 1985 1 daughter 78; 13 step daughters; 2 grandchildren.   Social Determinants of Health   Financial Resource Strain: Low Risk  (02/16/2022)   Overall Financial Resource Strain (CARDIA)    Difficulty of Paying Living Expenses: Not hard at all  Food Insecurity: No Food Insecurity (02/16/2022)   Hunger Vital Sign    Worried About Running Out of Food in the Last Year: Never true    Ran Out of Food in the Last Year: Never true  Transportation Needs: No Transportation Needs (02/16/2022)   PRAPARE - Administrator, Civil Service (Medical): No    Lack of Transportation (Non-Medical): No  Physical Activity: Insufficiently Active (02/16/2022)   Exercise Vital Sign    Days of Exercise per Week: 1 day    Minutes of Exercise per Session: 10 min  Stress: Stress Concern Present (02/16/2022)   [employer] of Occupational Health - Occupational Stress Questionnaire    Feeling of Stress : To some extent  Social Connections: Unknown (02/16/2022)   Social Connection and Isolation Panel [NHANES]    Frequency of Communication with Friends and Family: Patient declined    Frequency of Social Gatherings with Friends and Family: Once a week    Attends Religious Services: Never    Database administrator or  Organizations: No    Attends Engineer, structural: Never    Marital Status: Married  Recent Concern: Social Connections - Moderately Isolated (01/28/2022)   Social Connection and Isolation Panel [NHANES]    Frequency of Communication with Friends and Family: More than three times a week    Frequency of Social Gatherings with Friends and Family: More than three times a week    Attends Religious Services: Never    Database administrator or Organizations: No    Attends Banker Meetings: Never    Marital Status: Married    There were no vitals filed for  this visit. There is no height or weight on file to calculate BMI.  Physical Exam  ASSESSMENT AND PLAN:  There are no diagnoses linked to this encounter.  No orders of the defined types were placed in this encounter.   No problem-specific Assessment & Plan notes found for this encounter.   No follow-ups on file.  [name]. [place], MD  [facility]. [place] [facility].

## 2022-12-16 NOTE — Progress Notes (Unsigned)
Virtual Visit via Video Note I connected with [name] on 12/17/22 by a video enabled telemedicine application and verified that I am speaking with the correct person using two identifiers. Location patient: home Location provider:work office Persons participating in the virtual visit: patient, wife,provider  I discussed the limitations of evaluation and management by telemedicine and the availability of in person appointments. The patient expressed understanding and agreed to proceed.  Chief Complaint  Patient presents with   Headache   Shortness of Breath   HPI: [name] is a 89 yo male with PMHx significant for atrial fib on chronic anticoagulation, DM II, HLD, OSA not on CPAP, HTN, chronic diarrhea, BPH, chronic pain, HFpEF,COPD,and peripheral neuropathy  being seen today with chief complaint of shortness of breath with exertion and headache for the past 2 weeks. He denies any associated chest pain,diaphoresis, or palpitations He has a dry cough but denies wheezing. He has increased difficulty breathing when lying down and has noticed a couple pound weight gain over the past day or two. Negative for PND. He is not taking his Furosemide, prescribed bid, he has taken it once daily but none for the past few days. It aggravates urinary frequency.  He is not checking BP. He is on Imdur 30 mg daily and Valsartan 80 mg daily.  He also reports a long-standing headache described as a "heavy" frontal/parietal headache rated 2-3/10 with occasional morning nausea. He has reported headache when Imdur dose was increased, reported improvement after dose was decreased to current dose.  He has been using saline nasal irrigation for sinus congestion with some relief. Nasal congestion, worse when lying down. He used Flonase nasal spray in the past a helped. Negative for fever,sore throat, or unusual myalgias. No sick contact.  He also complains of significant fatigue and lack of energy  affecting his daily activities. Problem has been going on for years. OSA, not on CPAP. He reports feelings of unhappiness and lack of motivation but denies any suicidal thought/ideation. Hx of depression, tried some medications in the past, were not effective.  ROS: See pertinent positives and negatives per HPI.  Past Medical History:  Diagnosis Date   BACK PAIN, CHRONIC 04/18/2009   BENIGN PROSTATIC HYPERTROPHY, HX OF 01/19/2007   Carpal tunnel syndrome 03/24/2007   CORONARY ARTERY DISEASE 01/19/2007   DEPRESSION, CHRONIC 06/24/2007   DIABETES MELLITUS 01/19/2007   DIABETES MELLITUS, TYPE II, WITH NEUROLOGICAL COMPLICATIONS 06/17/2010   Diarrhea 02/23/2007   HEART MURMUR, SYSTOLIC 01/02/2008   HERPES SIMPLEX INFECTION 01/19/2007   HYPERLIPIDEMIA 01/19/2007   HYPERTENSION 01/19/2007   Intermediate coronary syndrome (HCC) 08/29/2008   LEG CRAMPS, NOCTURNAL 10/28/2007   Leg cramps, sleep related 08/05/2010   OBSTRUCTIVE SLEEP APNEA 01/19/2007   does not use CPAP   OSTEOARTHRITIS 06/24/2007   Other postprocedural status(V45.89) 01/19/2007   PERCUTANEOUS TRANSLUMINAL CORONARY ANGIOPLASTY, HX OF 01/19/2007   PLANTAR FASCIITIS, RIGHT 01/19/2007   SHOULDER PAIN, LEFT 03/24/2007   SPINAL STENOSIS, LUMBAR 06/03/2009   TINEA PEDIS 11/15/2009   URI 01/11/2009    Past Surgical History:  Procedure Laterality Date   arthroscopy, knee of hx     CARDIAC CATHETERIZATION     CATARACT EXTRACTION Left    COLONOSCOPY     inguinal herniorrhapy, hx     NASAL SINUS SURGERY     percutaneous transluminal coronary angioplasty, hx of  2004   Dr. Chales Abrahams   plastic joint in thumb     PTCA/stent  2004   TONSILLECTOMY  VASECTOMY      Family History  Problem Relation Age of Onset   Arthritis Sister    Diabetes Other    Coronary artery disease Other     Social History   Socioeconomic History   Marital status: Married    Spouse name: Not on file   Number of children: Not on file   Years of education: Not on  file   Highest education level: 12th grade  Occupational History   Occupation: retired    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  Tobacco Use   Smoking status: Former    Current packs/day: 0.00    Types: Cigarettes    Quit date: 04/14/1983    Years since quitting: 39.7   Smokeless tobacco: Never   Tobacco comments:    stopped 1985  Vaping Use   Vaping status: Never Used  Substance and Sexual Activity   Alcohol use: Yes    Comment: OCCASSIONAL   Drug use: Not Currently    Types: Marijuana    Comment: occ   Sexual activity: Not on file  Other Topics Concern   Not on file  Social History Narrative   Married in 1959 - 17yrs, divorced; married 1974 - 1.5 years, divorced; married 1985 1 daughter 63; 8 step daughters; 2 grandchildren.   Social Determinants of Health   Financial Resource Strain: Low Risk  (02/16/2022)   Overall Financial Resource Strain (CARDIA)    Difficulty of Paying Living Expenses: Not hard at all  Food Insecurity: No Food Insecurity (02/16/2022)   Hunger Vital Sign    Worried About Running Out of Food in the Last Year: Never true    Ran Out of Food in the Last Year: Never true  Transportation Needs: No Transportation Needs (02/16/2022)   PRAPARE - Administrator, Civil Service (Medical): No    Lack of Transportation (Non-Medical): No  Physical Activity: Insufficiently Active (02/16/2022)   Exercise Vital Sign    Days of Exercise per Week: 1 day    Minutes of Exercise per Session: 10 min  Stress: Stress Concern Present (02/16/2022)   [employer] of Occupational Health - Occupational Stress Questionnaire    Feeling of Stress : To some extent  Social Connections: Unknown (02/16/2022)   Social Connection and Isolation Panel [NHANES]    Frequency of Communication with Friends and Family: Patient declined    Frequency of Social Gatherings with Friends and Family: Once a week    Attends Religious Services: Never    Database administrator or  Organizations: No    Attends Engineer, structural: Never    Marital Status: Married  Recent Concern: Social Connections - Moderately Isolated (01/28/2022)   Social Connection and Isolation Panel [NHANES]    Frequency of Communication with Friends and Family: More than three times a week    Frequency of Social Gatherings with Friends and Family: More than three times a week    Attends Religious Services: Never    Database administrator or Organizations: No    Attends Banker Meetings: Never    Marital Status: Married  Catering manager Violence: Not At Risk (01/28/2022)   Humiliation, Afraid, Rape, and Kick questionnaire    Fear of Current or Ex-Partner: No    Emotionally Abused: No    Physically Abused: No    Sexually Abused: No     Current Outpatient Medications:    acetaminophen (TYLENOL) 500 MG tablet, Take 500 mg by mouth every 6 (  six) hours as needed., Disp: , Rfl:    alfuzosin (UROXATRAL) 10 MG 24 hr tablet, TAKE 1 TABLET (10 MG TOTAL) BY MOUTH DAILY WITH BREAKFAST., Disp: 90 tablet, Rfl: 2   apixaban (ELIQUIS) 5 MG TABS tablet, Take 1 tablet (5 mg total) by mouth 2 (two) times daily., Disp: 60 tablet, Rfl: 2   Blood Glucose Monitoring Suppl (ACCU-CHEK AVIVA CONNECT) w/Device KIT, 1 Device by Does not apply route daily., Disp: 1 kit, Rfl: 0   Blood Glucose Monitoring Suppl (ONE TOUCH ULTRA 2) w/Device KIT, Daily as directed., Disp: 1 kit, Rfl: 0   citalopram (CELEXA) 10 MG tablet, Take 1 tablet (10 mg total) by mouth daily., Disp: 30 tablet, Rfl: 3   Cyanocobalamin (B-12) 1000 MCG TABS, Take 1,000 mcg by mouth daily., Disp: , Rfl:    finasteride (PROSCAR) 5 MG tablet, TAKE 1 TABLET BY MOUTH EVERY DAY, Disp: 90 tablet, Rfl: 2   fluticasone (FLONASE) 50 MCG/ACT nasal spray, Place 1 spray into both nostrils 2 (two) times daily., Disp: 16 g, Rfl: 0   furosemide (LASIX) 40 MG tablet, TAKE 1 TABLET BY MOUTH TWICE A DAY, Disp: 180 tablet, Rfl: 1   glipiZIDE  (GLUCOTROL) 5 MG tablet, TAKE 1 TABLET BY MOUTH DAILY BEFORE BREAKFAST. 20-30 MINUTES BEFORE MEAL., Disp: 90 tablet, Rfl: 1   HYDROcodone-acetaminophen (NORCO/VICODIN) 5-325 MG tablet, Take 1 tablet by mouth every 12 (twelve) hours as needed for moderate pain., Disp: 30 tablet, Rfl: 0   Ipratropium-Albuterol (COMBIVENT RESPIMAT) 20-100 MCG/ACT AERS respimat, Inhale 1 puff into the lungs every 6 (six) hours as needed for wheezing., Disp: 4 g, Rfl: 1   isosorbide mononitrate (IMDUR) 30 MG 24 hr tablet, TAKE 1 TABLET BY MOUTH EVERY DAY, Disp: 90 tablet, Rfl: 2   Lancet Devices (LANCET DEVICE WITH EJECTOR) MISC, 1 Device by Does not apply route daily., Disp: 1 each, Rfl: 1   loperamide (IMODIUM A-D) 2 MG tablet, Take 2 mg by mouth 4 (four) times daily as needed for diarrhea or loose stools., Disp: , Rfl:    nitroGLYCERIN (NITROSTAT) 0.4 MG SL tablet, Place 1 tablet (0.4 mg total) under the tongue every 5 (five) minutes as needed for chest pain., Disp: 50 tablet, Rfl: 1   OneTouch Delica Lancets 33G MISC, USE TO CHECK BLOOD SUGAR DAILY AND PRN, Disp: 100 each, Rfl: 4   ONETOUCH ULTRA test strip, USE TO CHECK BLOOD SUGAR ONCE DAILY E11.9, Disp: 100 strip, Rfl: 4   tizanidine (ZANAFLEX) 2 MG capsule, TAKE 4 CAPSULES (8 MG TOTAL) BY MOUTH AT BEDTIME AS NEEDED FOR MUSCLE SPASMS., Disp: 360 capsule, Rfl: 1   valsartan (DIOVAN) 80 MG tablet, TAKE 1 TABLET BY MOUTH EVERY DAY, Disp: 90 tablet, Rfl: 3  Current Facility-Administered Medications:    triamcinolone acetonide (KENALOG-40) injection 40 mg, 40 mg, Intramuscular, Once, [place], Timoteo Expose, MD  EXAMTheodoro Kos per patient if applicable:Ht 5\' 3"  (1.6 m)   BMI 27.52 kg/m   GENERAL: alert, oriented, appears well and in no acute distress  HEENT: atraumatic, conjunctiva clear, no obvious abnormalities on inspection.  NECK: normal movements of the head and neck  LUNGS: on inspection no signs of respiratory distress, breathing rate appears normal, no obvious  gross SOB, gasping or wheezing  CV: no obvious cyanosis  MS: moves all visible extremities without noticeable abnormality  PSYCH/NEURO: pleasant and cooperative, no obvious depression or anxiety, speech and thought processing grossly intact  ASSESSMENT AND PLAN:  Discussed the following assessment and plan:  DOE (dyspnea on exertion) We discussed possible etiologies, including COPD exacerbation, CHF, deconditioning. Echo in 08/2017: LVEF 60-65% and grade II diastolic dysfunction.Pulmonary artery systolic pressure mildly elevated at 38 mmHg.  He does not want to come to the office to have blood work or imaging done. Recommend resuming furosemide 40 mg daily. Continue daily wt.  Clearly instructed about warning signs.  Chronic obstructive pulmonary disease, unspecified COPD type (HCC) Albuterol does not seem to be helping. Recommend changing to Combivent Respimat 1 puff every 6 hours as needed.  -     Combivent Respimat; Inhale 1 puff into the lungs every 6 (six) hours as needed for wheezing.  Dispense: 4 g; Refill: 1  Chronic fatigue Several of his chronic medical problems could be contributing factors. Today treatment for depression started.  Headache, unspecified headache type We discussed possible causes,?  Tension headache, allergies and wound some. It seems mild. If persistent we need to consider head imaging. Instructed about warning signs.  Nasal congestion Flonase nasal spray has helped in the past. Flonase nasal spray to use at bedtime for 10-14 days then prn. Continue nasal saline irrigations as needed.  -     Fluticasone Propionate; Place 1 spray into both nostrils 2 (two) times daily.  Dispense: 16 g; Refill: 0  Depression, major, recurrent, moderate (HCC) Recommend Citalopram 10 mg daily. Some side effects discussed. Instructed about warning signs.  -     Citalopram Hydrobromide; Take 1 tablet (10 mg total) by mouth daily.  Dispense: 30 tablet; Refill:  3  We discussed possible serious and likely etiologies, options for evaluation and workup, limitations of telemedicine visit vs in person visit, treatment, treatment risks and precautions. The patient was advised to call back or seek an in-person evaluation if the symptoms worsen or if the condition fails to improve as anticipated. I discussed the assessment and treatment plan with the patient. The patient was provided an opportunity to ask questions and all were answered. The patient agreed with the plan and demonstrated an understanding of the instructions.  Return in about 2 weeks (around 12/30/2022).  Kamila Broda G. [place], MD  [facility]. [place] office.

## 2022-12-17 ENCOUNTER — Encounter: Payer: Self-pay | Admitting: Family Medicine

## 2022-12-18 ENCOUNTER — Ambulatory Visit: Payer: Medicare HMO | Admitting: Family Medicine

## 2022-12-28 ENCOUNTER — Encounter: Payer: Self-pay | Admitting: Family Medicine

## 2023-01-18 ENCOUNTER — Other Ambulatory Visit: Payer: Self-pay | Admitting: Family Medicine

## 2023-01-18 DIAGNOSIS — R600 Localized edema: Secondary | ICD-10-CM

## 2023-01-18 DIAGNOSIS — I5032 Chronic diastolic (congestive) heart failure: Secondary | ICD-10-CM

## 2023-02-02 ENCOUNTER — Ambulatory Visit (INDEPENDENT_AMBULATORY_CARE_PROVIDER_SITE_OTHER): Payer: Medicare HMO | Admitting: Family Medicine

## 2023-02-02 VITALS — BP 151/85 | HR 61

## 2023-02-02 DIAGNOSIS — Z0000 Encounter for general adult medical examination without abnormal findings: Secondary | ICD-10-CM

## 2023-02-02 NOTE — Progress Notes (Signed)
PATIENT CHECK-IN and HEALTH RISK ASSESSMENT QUESTIONNAIRE:  -completed by phone/video for upcoming Medicare Preventive Visit  Pre-Visit Check-in: 1)Vitals (height, wt, BP, etc) - record in vitals section for visit on day of visit Request home vitals (wt, BP, etc.) and enter into vitals, THEN update Vital Signs SmartPhrase below at the top of the HPI. See below.  2)Review and Update Medications, Allergies PMH, Surgeries, Social history in Epic 3)Hospitalizations in the last year with date/reason? No   4)Review and Update Care Team (patient's specialists) in Epic 5) Complete PHQ9 in Epic  6) Complete Fall Screening in Epic 7)Review all Health Maintenance Due and order under PCP if not done.  Medicare Wellness Patient Questionnaire:  Answer theses question about your habits: Do you drink alcohol? Yes  If yes, how many drinks do you have a day?1 Have you ever smoked?yes  Quit date if applicable? 1985  How many packs a day do/did you smoke? Unknown  Do you use smokeless tobacco?no  Do you use an illicit drugs?no  Do you exercises? NO IF so, what type and how many days/minutes per week?NA, walks around some.  Are you sexually active?  No Number of partners?no  Typical breakfast: eggs bacon  Typical lunch: light Typical dinner:Chili, stew, steak, pizza  Typical snacks:ice cream, chips   Beverages: milk, water  Answer theses question about you: Can you perform most household chores?Yes  Do you find it hard to follow a conversation in a noisy room?Yes  Do you often ask people to speak up or repeat themselves?NO  Do you feel that you have a problem with memory?Yes  - some, but usually comes to him after a minute or so Do you balance your checkbook and or bank acounts?Yes  Do you feel safe at home?Yes  Last dentist visit? 3 years ago  Do you need assistance with any of the following: Please note if so   Driving? NO   Feeding yourself? NO   Getting from bed to chair? NO   Getting to the  toilet? NO   Bathing or showering? NO   Dressing yourself? NO   Managing money? NO   Climbing a flight of stairs? Yes   Preparing meals? NO     Do you have Advanced Directives in place (Living Will, Healthcare Power or Attorney)? Yes    Last eye Exam and location? Unknown    Do you currently use prescribed or non-prescribed narcotic or opioid pain medications? No   Do you have a history or close family history of breast, ovarian, tubal or peritoneal cancer or a family member with BRCA (breast cancer susceptibility 1 and 2) gene mutations? No   Request home vitals (wt, BP, etc.) and enter into vitals, THEN update Vital Signs SmartPhrase below at the top of the HPI. See below.   Nurse/Assistant Credentials/time stamp: Stann Ore CMA 12:41pm    ----------------------------------------------------------------------------------------------------------------------------------------------------------------------------------------------------------------------  Because this visit was a virtual/telehealth visit, some criteria may be missing or patient reported. Any vitals not documented were not able to be obtained and vitals that have been documented are patient reported.    MEDICARE ANNUAL PREVENTIVE CARE VISIT WITH PROVIDER (Welcome to Medicare, initial annual wellness or annual wellness exam)  Virtual Visit via Video Note  I connected with [name] on 02/02/23  by a video enabled telemedicine application and verified that I am speaking with the correct person using two identifiers.  Location patient: home Location provider:work or home office Persons participating in the virtual  visit: patient, provider  Concerns and/or follow up today: no concerns, stable.    See HM section in Epic for other details of completed HM.    ROS: negative for report of fevers, unintentional weight loss, vision changes, vision loss, hearing loss or change, chest pain, sob, hemoptysis,  melena, hematochezia, hematuria, falls, bleeding or bruising, thoughts of suicide or self harm, memory loss  Patient-completed extensive health risk assessment - reviewed and discussed with the patient: See Health Risk Assessment completed with patient prior to the visit either above or in recent phone note. This was reviewed in detailed with the patient today and appropriate recommendations, orders and referrals were placed as needed per Summary below and patient instructions.   Review of Medical History: -PMH, PSH, Family History and current specialty and care providers reviewed and updated and listed below   Patient Care Team: [place], [name], MD as PCP - General (Family Medicine) Baldo Daub, MD as PCP - Cardiology (Cardiology) Verner Chol, [facility] (Inactive) as Pharmacist (Pharmacist)   Past Medical History:  Diagnosis Date   BACK PAIN, CHRONIC 04/18/2009   BENIGN PROSTATIC HYPERTROPHY, HX OF 01/19/2007   Carpal tunnel syndrome 03/24/2007   CORONARY ARTERY DISEASE 01/19/2007   DEPRESSION, CHRONIC 06/24/2007   DIABETES MELLITUS 01/19/2007   DIABETES MELLITUS, TYPE II, WITH NEUROLOGICAL COMPLICATIONS 06/17/2010   Diarrhea 02/23/2007   HEART MURMUR, SYSTOLIC 01/02/2008   HERPES SIMPLEX INFECTION 01/19/2007   HYPERLIPIDEMIA 01/19/2007   HYPERTENSION 01/19/2007   Intermediate coronary syndrome (HCC) 08/29/2008   LEG CRAMPS, NOCTURNAL 10/28/2007   Leg cramps, sleep related 08/05/2010   OBSTRUCTIVE SLEEP APNEA 01/19/2007   does not use CPAP   OSTEOARTHRITIS 06/24/2007   Other postprocedural status(V45.89) 01/19/2007   PERCUTANEOUS TRANSLUMINAL CORONARY ANGIOPLASTY, HX OF 01/19/2007   PLANTAR FASCIITIS, RIGHT 01/19/2007   SHOULDER PAIN, LEFT 03/24/2007   SPINAL STENOSIS, LUMBAR 06/03/2009   TINEA PEDIS 11/15/2009   URI 01/11/2009    Past Surgical History:  Procedure Laterality Date   arthroscopy, knee of hx     CARDIAC CATHETERIZATION     CATARACT EXTRACTION Left    COLONOSCOPY      inguinal herniorrhapy, hx     NASAL SINUS SURGERY     percutaneous transluminal coronary angioplasty, hx of  2004   Dr. Chales Abrahams   plastic joint in thumb     PTCA/stent  2004   TONSILLECTOMY     VASECTOMY      Social History   Socioeconomic History   Marital status: Married    Spouse name: Not on file   Number of children: Not on file   Years of education: Not on file   Highest education level: 12th grade  Occupational History   Occupation: retired    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  Tobacco Use   Smoking status: Former    Current packs/day: 0.00    Types: Cigarettes    Quit date: 04/14/1983    Years since quitting: 39.8   Smokeless tobacco: Never   Tobacco comments:    stopped 1985  Vaping Use   Vaping status: Never Used  Substance and Sexual Activity   Alcohol use: Yes    Comment: OCCASSIONAL   Drug use: Not Currently    Types: Marijuana    Comment: occ   Sexual activity: Not on file  Other Topics Concern   Not on file  Social History Narrative   Married in [place] - 53yrs, divorced; married 1974 - 1.5 years, divorced; married 1985  1 daughter 34; 24 step daughters; 2 grandchildren.   Social Determinants of Health   Financial Resource Strain: Low Risk  (02/16/2022)   Overall Financial Resource Strain (CARDIA)    Difficulty of Paying Living Expenses: Not hard at all  Food Insecurity: No Food Insecurity (02/16/2022)   Hunger Vital Sign    Worried About Running Out of Food in the Last Year: Never true    Ran Out of Food in the Last Year: Never true  Transportation Needs: No Transportation Needs (02/16/2022)   PRAPARE - Administrator, Civil Service (Medical): No    Lack of Transportation (Non-Medical): No  Physical Activity: Insufficiently Active (02/16/2022)   Exercise Vital Sign    Days of Exercise per Week: 1 day    Minutes of Exercise per Session: 10 min  Stress: Stress Concern Present (02/16/2022)   [employer] of Occupational Health -  Occupational Stress Questionnaire    Feeling of Stress : To some extent  Social Connections: Unknown (02/16/2022)   Social Connection and Isolation Panel [NHANES]    Frequency of Communication with Friends and Family: Patient declined    Frequency of Social Gatherings with Friends and Family: Once a week    Attends Religious Services: Never    Database administrator or Organizations: No    Attends Engineer, structural: Not on file    Marital Status: Married  Recent Concern: Social Connections - Moderately Isolated (01/28/2022)   Social Connection and Isolation Panel [NHANES]    Frequency of Communication with Friends and Family: More than three times a week    Frequency of Social Gatherings with Friends and Family: More than three times a week    Attends Religious Services: Never    Database administrator or Organizations: No    Attends Banker Meetings: Never    Marital Status: Married  Catering manager Violence: Not At Risk (01/28/2022)   Humiliation, Afraid, Rape, and Kick questionnaire    Fear of Current or Ex-Partner: No    Emotionally Abused: No    Physically Abused: No    Sexually Abused: No    Family History  Problem Relation Age of Onset   Arthritis Sister    Diabetes Other    Coronary artery disease Other     Current Outpatient Medications on File Prior to Visit  Medication Sig Dispense Refill   acetaminophen (TYLENOL) 500 MG tablet Take 500 mg by mouth every 6 (six) hours as needed.     alfuzosin (UROXATRAL) 10 MG 24 hr tablet TAKE 1 TABLET (10 MG TOTAL) BY MOUTH DAILY WITH BREAKFAST. 90 tablet 2   Blood Glucose Monitoring Suppl (ACCU-CHEK AVIVA CONNECT) w/Device KIT 1 Device by Does not apply route daily. 1 kit 0   Blood Glucose Monitoring Suppl (ONE TOUCH ULTRA 2) w/Device KIT Daily as directed. 1 kit 0   citalopram (CELEXA) 10 MG tablet Take 1 tablet (10 mg total) by mouth daily. 30 tablet 3   Cyanocobalamin (B-12) 1000 MCG TABS Take 1,000  mcg by mouth daily.     finasteride (PROSCAR) 5 MG tablet TAKE 1 TABLET BY MOUTH EVERY DAY 90 tablet 2   fluticasone (FLONASE) 50 MCG/ACT nasal spray Place 1 spray into both nostrils 2 (two) times daily. 16 g 0   furosemide (LASIX) 40 MG tablet TAKE 1 TABLET BY MOUTH TWICE A DAY 180 tablet 1   glipiZIDE (GLUCOTROL) 5 MG tablet TAKE 1 TABLET BY MOUTH DAILY BEFORE BREAKFAST.  20-30 MINUTES BEFORE MEAL. 90 tablet 1   HYDROcodone-acetaminophen (NORCO/VICODIN) 5-325 MG tablet Take 1 tablet by mouth every 12 (twelve) hours as needed for moderate pain. 30 tablet 0   Ipratropium-Albuterol (COMBIVENT RESPIMAT) 20-100 MCG/ACT AERS respimat Inhale 1 puff into the lungs every 6 (six) hours as needed for wheezing. 4 g 1   isosorbide mononitrate (IMDUR) 30 MG 24 hr tablet TAKE 1 TABLET BY MOUTH EVERY DAY 90 tablet 2   Lancet Devices (LANCET DEVICE WITH EJECTOR) MISC 1 Device by Does not apply route daily. 1 each 1   loperamide (IMODIUM A-D) 2 MG tablet Take 2 mg by mouth 4 (four) times daily as needed for diarrhea or loose stools.     nitroGLYCERIN (NITROSTAT) 0.4 MG SL tablet Place 1 tablet (0.4 mg total) under the tongue every 5 (five) minutes as needed for chest pain. 50 tablet 1   OneTouch Delica Lancets 33G MISC USE TO CHECK BLOOD SUGAR DAILY AND PRN 100 each 4   ONETOUCH ULTRA test strip USE TO CHECK BLOOD SUGAR ONCE DAILY E11.9 100 strip 4   tizanidine (ZANAFLEX) 2 MG capsule TAKE 4 CAPSULES (8 MG TOTAL) BY MOUTH AT BEDTIME AS NEEDED FOR MUSCLE SPASMS. 360 capsule 1   valsartan (DIOVAN) 80 MG tablet TAKE 1 TABLET BY MOUTH EVERY DAY 90 tablet 3   apixaban (ELIQUIS) 5 MG TABS tablet Take 1 tablet (5 mg total) by mouth 2 (two) times daily. (Patient not taking: Reported on 02/02/2023) 60 tablet 2   Current Facility-Administered Medications on File Prior to Visit  Medication Dose Route Frequency Provider Last Rate Last Admin   triamcinolone acetonide (KENALOG-40) injection 40 mg  40 mg Intramuscular Once  [place], [name], MD        No Known Allergies     Physical Exam Vitals requested from patient and listed below if patient had equipment and was able to obtain at home for this virtual visit: Vitals:   02/02/23 1227  BP: (!) 151/85  Pulse: 61  SpO2: 96%   Estimated body mass index is 27.52 kg/m as calculated from the following:   Height as of 12/16/22: 5\' 3"  (1.6 m).   Weight as of 10/28/22: 155 lb 6 oz (70.5 kg).  EKG (optional): deferred due to virtual visit  GENERAL: alert, oriented, no acute distress detected; full vision exam deferred due to pandemic and/or virtual encounter  HEENT: atraumatic, conjunttiva clear, no obvious abnormalities on inspection of external nose and ears  NECK: normal movements of the head and neck  LUNGS: on inspection no signs of respiratory distress, breathing rate appears normal, no obvious gross SOB, gasping or wheezing  CV: no obvious cyanosis  MS: moves all visible extremities without noticeable abnormality  PSYCH/NEURO: pleasant and cooperative, no obvious depression or anxiety, speech and thought processing grossly intact, Cognitive function grossly intact  Flowsheet Row Office Visit from [facility]/02/2023 in [facility]  PHQ-9 Total Score 12           02/02/2023   12:27 PM 10/28/2022    3:59 PM 08/31/2022   12:34 PM 02/23/2022   12:36 PM 01/28/2022    2:57 PM  Depression screen PHQ 2/9  Decreased Interest 3 3 0 2 0  Down, Depressed, Hopeless 2 2 0 2 0  PHQ - 2 Score 5 5 0 4 0  Altered sleeping 1 1 0 2   Tired, decreased energy 3 3 0 3   Change in appetite 0 0  0 2   Feeling bad or failure about yourself  1 1 0 0   Trouble concentrating 1 1 0 0   Moving slowly or fidgety/restless 0 1 0 0   Suicidal thoughts 1 1 0 0   PHQ-9 Score 12 13 0 11   Difficult doing work/chores Not difficult at all Somewhat difficult Not difficult at all Somewhat difficult   Reports stable. Just feels can't do as much as he  used to due to age. Reads lots of books which he enjoys.      01/28/2022    2:59 PM 02/16/2022    8:57 PM 02/23/2022   12:36 PM 08/31/2022   12:34 PM 02/02/2023   12:27 PM  Fall Risk  Falls in the past year? 0 0 0 1 0  Was there an injury with Fall? 0  0 1 0  Fall Risk Category Calculator 0  0 2 0  Fall Risk Category (Retired) Low  Low    (RETIRED) Patient Fall Risk Level Low fall risk  Low fall risk    Patient at Risk for Falls Due to No Fall Risks  Other (Comment) History of fall(s);Impaired balance/gait   Fall risk Follow up Falls prevention discussed  Falls evaluation completed Falls evaluation completed Falls evaluation completed     SUMMARY AND PLAN:  Encounter for Medicare annual wellness exam   Discussed applicable health maintenance/preventive health measures and advised and referred or ordered per patient preferences: -he plans to get vaccines at the pharmacy, agrees to let us know when does so we can update.  Health Maintenance  Topic Date Due   Zoster Vaccines- Shingrix (1 of 2) 02/19/1984   Pneumonia Vaccine 55+ Years old (3 of 3 - PPSV23 or PCV20) 01/26/2014   DTaP/Tdap/Td (2 - Tdap) 04/05/2019   OPHTHALMOLOGY EXAM  07/02/2020   INFLUENZA VACCINE  11/12/2022   COVID-19 Vaccine (5 - 2023-24 season) 12/13/2022   FOOT EXAM  03/24/2023   HEMOGLOBIN A1C  04/30/2023   Medicare Annual Wellness (AWV)  02/02/2024   HPV VACCINES  Aged [employer] and counseling on the following was provided based on the above review of health and a plan/checklist for the patient, along with additional information discussed, was provided for the patient in the patient instructions :  -in terms of BP - he had not taken his BP meds yet today and plans, denies any symptoms. He agrees to monitor this week and notify PCP if running high - discussed goals. - Provided safe balance exercises that can be done at home to improve balance and discussed exercise guidelines for adults with include  balance exercises at least 3 days per week - see pt instructions -Advised and counseled on a healthy lifestyle  -Reviewed patient's current diet. Advised and counseled on a whole foods based healthy diet. A summary of a healthy diet was provided in the Patient Instructions.  -reviewed patient's current physical activity level and discussed exercise guidelines for adults. Discussed community resources and ideas for safe exercise at home to assist in meeting exercise guideline recommendations in a safe and healthy way.  -Advise yearly dental visits at minimum and regular eye exams   Follow up: see patient instructions   Patient Instructions  I really enjoyed getting to talk with you today! I am available on Tuesdays and Thursdays for virtual visits if you have any questions or concerns, or if I can be of any further assistance.   CHECKLIST FROM [place]  VISIT:  -Follow up (please call to schedule if not scheduled after visit):   -yearly for annual wellness visit with primary care office  Here is a list of your preventive care/health maintenance measures and the plan for each if any are due:  PLAN For any measures below that may be due: -can get the vaccines at the pharmacy, please let us know when you do so that we can update -get your eye exam yearly  Health Maintenance  Topic Date Due   Zoster Vaccines- Shingrix (1 of 2) 02/19/1984   Pneumonia Vaccine 31+ Years old (3 of 3 - PPSV23 or PCV20) 01/26/2014   DTaP/Tdap/Td (2 - Tdap) 04/05/2019   OPHTHALMOLOGY EXAM  07/02/2020   INFLUENZA VACCINE  11/12/2022   COVID-19 Vaccine (5 - 2023-24 season) 12/13/2022   FOOT EXAM  03/24/2023   HEMOGLOBIN A1C  04/30/2023   Medicare Annual Wellness (AWV)  02/02/2024   HPV VACCINES  Aged Out    -See a dentist at least yearly  -Get your eyes checked and then per your eye specialist's recommendations  -Other issues addressed today:   -I have included below further information regarding  a healthy whole foods based diet, physical activity guidelines for adults, stress management and opportunities for social connections. I hope you find this information useful.   -----------------------------------------------------------------------------------------------------------------------------------------------------------------------------------------------------------------------------------------------------------  NUTRITION: -eat real food: lots of colorful vegetables (half the plate) and fruits -5-7 servings of vegetables and fruits per day (fresh or steamed is best), exp. 2 servings of vegetables with lunch and dinner and 2 servings of fruit per day. Berries and greens such as kale and collards are great choices.  -consume on a regular basis: whole grains (make sure first ingredient on label contains the word "whole"), fresh fruits, fish, nuts, seeds, healthy oils (such as olive oil, avocado oil, grape seed oil) -may eat small amounts of dairy and lean meat on occasion, but avoid processed meats such as ham, bacon, lunch meat, etc. -drink water -try to avoid fast food and pre-packaged foods, processed meat -most experts advise limiting sodium to < 2300mg  per day, should limit further is any chronic conditions such as high blood pressure, heart disease, diabetes, etc. The American Heart Association advised that < 1500mg  is is ideal -try to avoid foods that contain any ingredients with names you do not recognize  -try to avoid sugar/sweets (except for the natural sugar that occurs in fresh fruit) -try to avoid sweet drinks -try to avoid white rice, white bread, pasta (unless whole grain), white or yellow potatoes  EXERCISE GUIDELINES FOR ADULTS: -if you wish to increase your physical activity, do so gradually and with the approval of your doctor -STOP and seek medical care immediately if you have any chest pain, chest discomfort or trouble breathing when starting or increasing  exercise  -move and stretch your body, legs, feet and arms when sitting for long periods -Physical activity guidelines for optimal health in adults: -least 150 minutes per week of aerobic exercise (can talk, but not sing) once approved by your doctor, 20-30 minutes of sustained activity or two 10 minute episodes of sustained activity every day.  -resistance training at least 2 days per week if approved by your doctor -balance exercises 3+ days per week:   Stand somewhere where you have something sturdy to hold onto if you lose balance.    1) lift up on toes, start with 5x per day and work up to 20x   2) stand and lift on leg  straight out to the side so that foot is a few inches of the floor, start with 5x each side and work up to 20x each side   3) stand on one foot, start with 5 seconds each side and work up to 20 seconds on each side  If you need ideas or help with getting more active:  -Silver sneakers https://tools.silversneakers.com  -Walk with a Doc: http://www.duncan-williams.com/  -try to include resistance (weight lifting/strength building) and balance exercises twice per week: or the following link for ideas: http://castillo-powell.com/  BuyDucts.dk  STRESS MANAGEMENT: -can try meditating, or just sitting quietly with deep breathing while intentionally relaxing all parts of your body for 5 minutes daily -if you need further help with stress, anxiety or depression please follow up with your primary doctor or contact the wonderful folks at [facility]: [phone]  SOCIAL CONNECTIONS: -options in [place] if you wish to engage in more social and exercise related activities:  -Silver sneakers https://tools.silversneakers.com  -Walk with a Doc: http://www.duncan-williams.com/  -Check out the [facility] Active Adults 50+ section on the [place] [employer] (hiking clubs, book  clubs, cards and games, chess, exercise classes, aquatic classes and much more) - see the website for details: https://www.[place].gov/departments/parks-recreation/active-adults50  -YouTube has lots of exercise videos for different ages and abilities as well  -[facility] (a variety of indoor and outdoor inperson activities for adults). [phone]. [address]  -Virtual Online Classes (a variety of topics): see seniorplanet.org or call 206-758-3290  -consider volunteering at a school, hospice center, church, senior center or elsewhere           Terressa Koyanagi, DO

## 2023-02-02 NOTE — Patient Instructions (Signed)
I really enjoyed getting to talk with you today! I am available on Tuesdays and Thursdays for virtual visits if you have any questions or concerns, or if I can be of any further assistance.   CHECKLIST FROM ANNUAL WELLNESS VISIT:  -Follow up (please call to schedule if not scheduled after visit):   -yearly for annual wellness visit with primary care office  Here is a list of your preventive care/health maintenance measures and the plan for each if any are due:  PLAN For any measures below that may be due: -can get the vaccines at the pharmacy, please let us know when you do so that we can update -get your eye exam yearly  Health Maintenance  Topic Date Due   Zoster Vaccines- Shingrix (1 of 2) 02/19/1984   Pneumonia Vaccine 48+ Years old (3 of 3 - PPSV23 or PCV20) 01/26/2014   DTaP/Tdap/Td (2 - Tdap) 04/05/2019   OPHTHALMOLOGY EXAM  07/02/2020   INFLUENZA VACCINE  11/12/2022   COVID-19 Vaccine (5 - 2023-24 season) 12/13/2022   FOOT EXAM  03/24/2023   HEMOGLOBIN A1C  04/30/2023   Medicare Annual Wellness (AWV)  02/02/2024   HPV VACCINES  Aged Out    -See a dentist at least yearly  -Get your eyes checked and then per your eye specialist's recommendations  -Other issues addressed today:   -I have included below further information regarding a healthy whole foods based diet, physical activity guidelines for adults, stress management and opportunities for social connections. I hope you find this information useful.   -----------------------------------------------------------------------------------------------------------------------------------------------------------------------------------------------------------------------------------------------------------  NUTRITION: -eat real food: lots of colorful vegetables (half the plate) and fruits -5-7 servings of vegetables and fruits per day (fresh or steamed is best), exp. 2 servings of vegetables with lunch and dinner and 2  servings of fruit per day. Berries and greens such as kale and collards are great choices.  -consume on a regular basis: whole grains (make sure first ingredient on label contains the word "whole"), fresh fruits, fish, nuts, seeds, healthy oils (such as olive oil, avocado oil, grape seed oil) -may eat small amounts of dairy and lean meat on occasion, but avoid processed meats such as ham, bacon, lunch meat, etc. -drink water -try to avoid fast food and pre-packaged foods, processed meat -most experts advise limiting sodium to < 2300mg  per day, should limit further is any chronic conditions such as high blood pressure, heart disease, diabetes, etc. The American Heart Association advised that < 1500mg  is is ideal -try to avoid foods that contain any ingredients with names you do not recognize  -try to avoid sugar/sweets (except for the natural sugar that occurs in fresh fruit) -try to avoid sweet drinks -try to avoid white rice, white bread, pasta (unless whole grain), white or yellow potatoes  EXERCISE GUIDELINES FOR ADULTS: -if you wish to increase your physical activity, do so gradually and with the approval of your doctor -STOP and seek medical care immediately if you have any chest pain, chest discomfort or trouble breathing when starting or increasing exercise  -move and stretch your body, legs, feet and arms when sitting for long periods -Physical activity guidelines for optimal health in adults: -least 150 minutes per week of aerobic exercise (can talk, but not sing) once approved by your doctor, 20-30 minutes of sustained activity or two 10 minute episodes of sustained activity every day.  -resistance training at least 2 days per week if approved by your doctor -balance exercises 3+ days per week:  Stand somewhere where you have something sturdy to hold onto if you lose balance.    1) lift up on toes, start with 5x per day and work up to 20x   2) stand and lift on leg straight out to the  side so that foot is a few inches of the floor, start with 5x each side and work up to 20x each side   3) stand on one foot, start with 5 seconds each side and work up to 20 seconds on each side  If you need ideas or help with getting more active:  -Silver sneakers https://tools.silversneakers.com  -Walk with a Doc: http://www.duncan-williams.com/  -try to include resistance (weight lifting/strength building) and balance exercises twice per week: or the following link for ideas: http://castillo-powell.com/  BuyDucts.dk  STRESS MANAGEMENT: -can try meditating, or just sitting quietly with deep breathing while intentionally relaxing all parts of your body for 5 minutes daily -if you need further help with stress, anxiety or depression please follow up with your primary doctor or contact the wonderful folks at [facility]: [phone]  SOCIAL CONNECTIONS: -options in [place] if you wish to engage in more social and exercise related activities:  -Silver sneakers https://tools.silversneakers.com  -Walk with a Doc: http://www.duncan-williams.com/  -Check out the [facility] Active Adults 50+ section on the [place] [employer] (hiking clubs, book clubs, cards and games, chess, exercise classes, aquatic classes and much more) - see the website for details: https://www.[place].gov/departments/parks-recreation/active-adults50  -YouTube has lots of exercise videos for different ages and abilities as well  -[facility] (a variety of indoor and outdoor inperson activities for adults). [phone]. [address].  -Virtual Online Classes (a variety of topics): see seniorplanet.org or call [phone]  -consider volunteering at a school, hospice center, church, senior center or elsewhere

## 2023-02-04 ENCOUNTER — Other Ambulatory Visit: Payer: Self-pay | Admitting: Family Medicine

## 2023-02-05 ENCOUNTER — Other Ambulatory Visit: Payer: Self-pay | Admitting: Family Medicine

## 2023-02-05 DIAGNOSIS — I119 Hypertensive heart disease without heart failure: Secondary | ICD-10-CM

## 2023-02-05 DIAGNOSIS — I25118 Atherosclerotic heart disease of native coronary artery with other forms of angina pectoris: Secondary | ICD-10-CM

## 2023-02-15 ENCOUNTER — Other Ambulatory Visit: Payer: Self-pay | Admitting: Family Medicine

## 2023-02-15 DIAGNOSIS — R0981 Nasal congestion: Secondary | ICD-10-CM

## 2023-03-23 ENCOUNTER — Other Ambulatory Visit: Payer: Self-pay | Admitting: Family Medicine

## 2023-03-23 DIAGNOSIS — N401 Enlarged prostate with lower urinary tract symptoms: Secondary | ICD-10-CM

## 2023-03-27 ENCOUNTER — Other Ambulatory Visit: Payer: Self-pay | Admitting: Family Medicine

## 2023-03-27 DIAGNOSIS — N401 Enlarged prostate with lower urinary tract symptoms: Secondary | ICD-10-CM

## 2023-03-28 ENCOUNTER — Other Ambulatory Visit: Payer: Self-pay | Admitting: Family Medicine

## 2023-03-28 DIAGNOSIS — R252 Cramp and spasm: Secondary | ICD-10-CM

## 2023-05-14 ENCOUNTER — Other Ambulatory Visit: Payer: Self-pay | Admitting: Family Medicine

## 2023-05-14 DIAGNOSIS — R0981 Nasal congestion: Secondary | ICD-10-CM

## 2023-05-18 ENCOUNTER — Telehealth: Payer: Self-pay | Admitting: Family Medicine

## 2023-05-18 NOTE — Telephone Encounter (Signed)
 Copied from CRM [id number]. Topic: General - Other >> May 18, 2023  1:51 PM Pascal Lux wrote: Reason for CRM: Patient spouse called requesting a handicap placard.

## 2023-05-19 NOTE — Telephone Encounter (Signed)
 It is fine, he has chronic pain and knee /hip OA. Thanks, BJ

## 2023-05-20 NOTE — Telephone Encounter (Signed)
 I left patient a voicemail letting him know that the placard is up at the front desk & ready for pick up.

## 2023-06-19 ENCOUNTER — Other Ambulatory Visit: Payer: Self-pay | Admitting: Family Medicine

## 2023-08-01 ENCOUNTER — Other Ambulatory Visit: Payer: Self-pay | Admitting: Family Medicine

## 2023-09-22 ENCOUNTER — Ambulatory Visit (INDEPENDENT_AMBULATORY_CARE_PROVIDER_SITE_OTHER): Admitting: Family Medicine

## 2023-09-22 ENCOUNTER — Ambulatory Visit

## 2023-09-22 ENCOUNTER — Encounter: Payer: Self-pay | Admitting: Family Medicine

## 2023-09-22 VITALS — BP 128/80 | HR 62 | Resp 16 | Ht 63.0 in | Wt 153.1 lb

## 2023-09-22 DIAGNOSIS — E1149 Type 2 diabetes mellitus with other diabetic neurological complication: Secondary | ICD-10-CM

## 2023-09-22 DIAGNOSIS — I5032 Chronic diastolic (congestive) heart failure: Secondary | ICD-10-CM | POA: Diagnosis not present

## 2023-09-22 DIAGNOSIS — E782 Mixed hyperlipidemia: Secondary | ICD-10-CM | POA: Diagnosis not present

## 2023-09-22 DIAGNOSIS — I119 Hypertensive heart disease without heart failure: Secondary | ICD-10-CM | POA: Diagnosis not present

## 2023-09-22 DIAGNOSIS — M79672 Pain in left foot: Secondary | ICD-10-CM

## 2023-09-22 DIAGNOSIS — J449 Chronic obstructive pulmonary disease, unspecified: Secondary | ICD-10-CM | POA: Diagnosis not present

## 2023-09-22 DIAGNOSIS — I4891 Unspecified atrial fibrillation: Secondary | ICD-10-CM

## 2023-09-22 DIAGNOSIS — M19072 Primary osteoarthritis, left ankle and foot: Secondary | ICD-10-CM | POA: Diagnosis not present

## 2023-09-22 DIAGNOSIS — M159 Polyosteoarthritis, unspecified: Secondary | ICD-10-CM

## 2023-09-22 DIAGNOSIS — Z7984 Long term (current) use of oral hypoglycemic drugs: Secondary | ICD-10-CM | POA: Diagnosis not present

## 2023-09-22 NOTE — Assessment & Plan Note (Signed)
 Asymptomatic at this time. He has not needed to use Combivent  in the past few months.

## 2023-09-22 NOTE — Assessment & Plan Note (Signed)
 LDL 93 in 12/2020. He has not tolerated statins therapy and he is not interested in pharmacologic treatment.  So we decided to hold on FLP.

## 2023-09-22 NOTE — Assessment & Plan Note (Signed)
 Rate controlled. He is not taking Eliquis  and not interested in resuming it. He does not want to follow-up with cardiologist. We discussed possible complications of atrial fibrillation. Instructed about warning signs.

## 2023-09-22 NOTE — Assessment & Plan Note (Signed)
 Last echo in 08/2017 LVEF 60 to 65% and grade 2 diastolic dysfunction. He could not afford Jardiance . Continue furosemide  40 mg twice daily and low-salt diet.

## 2023-09-22 NOTE — Assessment & Plan Note (Addendum)
 Last hemoglobin A1c 6.9 in 10/2022. Continue glipizide  5 mg, try to take it daily. Periodic eye exam and foot care to continue. F/U in 6 months.

## 2023-09-22 NOTE — Progress Notes (Signed)
 ACUTE VISIT Chief Complaint  Patient presents with   Foot Pain   HPI: [name] is a 89 y.o. male with a PMHx significant for DM II, HLD, OSA not on CPAP, HTN, chronic diarrhea, BPH, chronic pain, HFpEF, COPD, and peripheral neuropathy, who is here today with his wife complaining of left foot pain.   Foot pain:  Patient complains of left foot pain for the whole day on 6/8 behind his fifth toe. On 6/10, the pain switched to the other side of his foot, behind the great toe. He says he has pain every 4-5 minutes. No pain this morning.  He describes the pain as if his foot were run over by a car.  He tried taking ibuprofen, tylenol , and aspirin for the pain but had no relief.  The pain is slightly relieved by walking.  Pertinent negatives include unusual swelling or redness around his toes, or recent injury in his foot.   Hypertension:  Medications: Currently on valsartan  80 mg daily and Imdur  30 mg daily. BP readings at home: He has not been checking his BP regularly at home.  Negative for unusual or severe headache, visual changes, exertional chest pain, worsening dyspnea,  focal weakness, or worsening edema. HFpEF, he takes furosemide  40 mg twice daily. Negative for orthopnea or PND.  Atrial Fibrillation:  No longer taking Eliquis  5 mg. Negative for episode of palpitations or diaphoresis.  Lab Results  Component Value Date   NA 142 08/31/2022   CL 104 08/31/2022   K 4.4 08/31/2022   CO2 28 08/31/2022   BUN 15 08/31/2022   CREATININE 0.76 08/31/2022   GFR 80.24 08/31/2022   CALCIUM  9.8 08/31/2022   ALBUMIN 4.7 08/13/2021   GLUCOSE 153 (H) 08/31/2022   Diabetes Mellitus II: Problem complicated by peripheral neuropathy. - Checking BG at home: He checks his blood sugar once in awhile and says he has not had any below 100.  - Medications: Currently on Glipizide  5 mg, although he admits he is not taking it very regularly.  - Diet: He mentions he has a decreased appetite,  and frequently skips breakfast.  - foot exam: 03/2022. - Negative for symptoms of hypoglycemia, polyuria, polydipsia, foot ulcers/trauma  Lab Results  Component Value Date   HGBA1C 6.9 10/28/2022   Lab Results  Component Value Date   MICROALBUR 3.5 (H) 08/13/2021  Last LDL Hyperlipidemia: Not currently on pharmacologic treatment.  In the past statins have exacerbated arthralgias and myalgias.  Lab Results  Component Value Date   CHOL 168 12/23/2020   HDL 48.60 12/23/2020   LDLCALC 93 12/23/2020   LDLDIRECT 121.0 04/26/2020   TRIG 132.0 12/23/2020   CHOLHDL 3 12/23/2020   Also mentions he had a fall 2-3 weeks ago.   He says he is drinking less whiskey lately, still doing to a few times per week.  COPD: He has not had exacerbations in the past few months. He is on Combivent  20-100 mcg , which he takes as needed.  Review of Systems  Constitutional:  Positive for fatigue. Negative for appetite change.  HENT:  Negative for sore throat.   Respiratory:  Negative for cough and wheezing.   Gastrointestinal:  Negative for abdominal pain, nausea and vomiting.  Genitourinary:  Negative for decreased urine volume, dysuria and hematuria.  Musculoskeletal:  Positive for arthralgias and gait problem.  Skin:  Negative for rash.  Neurological:  Negative for syncope and facial asymmetry.  Psychiatric/Behavioral:  Negative for hallucinations.  See other pertinent positives and negatives in HPI.  Current Outpatient Medications on File Prior to Visit  Medication Sig Dispense Refill   acetaminophen  (TYLENOL ) 500 MG tablet Take 500 mg by mouth every 6 (six) hours as needed.     alfuzosin  (UROXATRAL ) 10 MG 24 hr tablet TAKE 1 TABLET (10 MG TOTAL) BY MOUTH DAILY WITH BREAKFAST. 90 tablet 2   Blood Glucose Monitoring Suppl (ACCU-CHEK AVIVA CONNECT) w/Device KIT 1 Device by Does not apply route daily. 1 kit 0   Blood Glucose Monitoring Suppl (ONE TOUCH ULTRA 2) w/Device KIT Daily as directed. 1  kit 0   citalopram  (CELEXA ) 10 MG tablet Take 1 tablet (10 mg total) by mouth daily. 30 tablet 3   Cyanocobalamin  (B-12) 1000 MCG TABS Take 1,000 mcg by mouth daily.     finasteride  (PROSCAR ) 5 MG tablet TAKE 1 TABLET BY MOUTH EVERY DAY 90 tablet 2   fluticasone  (FLONASE ) 50 MCG/ACT nasal spray PLACE 1 SPRAY INTO BOTH NOSTRILS 2 (TWO) TIMES DAILY 48 mL 1   furosemide  (LASIX ) 40 MG tablet TAKE 1 TABLET BY MOUTH TWICE A DAY 180 tablet 1   glipiZIDE  (GLUCOTROL ) 5 MG tablet TAKE 1 TABLET BY MOUTH DAILY BEFORE BREAKFAST. 20-30 MINUTES BEFORE MEAL. 90 tablet 1   Ipratropium-Albuterol  (COMBIVENT  RESPIMAT) 20-100 MCG/ACT AERS respimat Inhale 1 puff into the lungs every 6 (six) hours as needed for wheezing. 4 g 1   isosorbide  mononitrate (IMDUR ) 30 MG 24 hr tablet TAKE 1 TABLET BY MOUTH EVERY DAY 90 tablet 2   Lancet Devices (LANCET DEVICE WITH EJECTOR) MISC 1 Device by Does not apply route daily. 1 each 1   loperamide (IMODIUM A-D) 2 MG tablet Take 2 mg by mouth 4 (four) times daily as needed for diarrhea or loose stools.     nitroGLYCERIN  (NITROSTAT ) 0.4 MG SL tablet Place 1 tablet (0.4 mg total) under the tongue every 5 (five) minutes as needed for chest pain. 50 tablet 1   OneTouch Delica Lancets 33G MISC USE TO CHECK BLOOD SUGAR DAILY AND PRN 100 each 4   ONETOUCH ULTRA test strip USE TO CHECK BLOOD SUGAR ONCE DAILY E11.9 100 strip 4   tizanidine  (ZANAFLEX ) 2 MG capsule TAKE 4 CAPSULES (8 MG TOTAL) BY MOUTH AT BEDTIME AS NEEDED FOR MUSCLE SPASMS. 360 capsule 1   valsartan  (DIOVAN ) 80 MG tablet TAKE 1 TABLET BY MOUTH EVERY DAY 90 tablet 3   Current Facility-Administered Medications on File Prior to Visit  Medication Dose Route Frequency Provider Last Rate Last Admin   triamcinolone  acetonide (KENALOG -40) injection 40 mg  40 mg Intramuscular Once [place], Marquail Bradwell G, MD       Past Medical History:  Diagnosis Date   BACK PAIN, CHRONIC 04/18/2009   BENIGN PROSTATIC HYPERTROPHY, HX OF 01/19/2007   Carpal  tunnel syndrome 03/24/2007   CORONARY ARTERY DISEASE 01/19/2007   DEPRESSION, CHRONIC 06/24/2007   DIABETES MELLITUS 01/19/2007   DIABETES MELLITUS, TYPE II, WITH NEUROLOGICAL COMPLICATIONS 06/17/2010   Diarrhea 02/23/2007   HEART MURMUR, SYSTOLIC 01/02/2008   HERPES SIMPLEX INFECTION 01/19/2007   HYPERLIPIDEMIA 01/19/2007   HYPERTENSION 01/19/2007   Intermediate coronary syndrome (HCC) 08/29/2008   LEG CRAMPS, NOCTURNAL 10/28/2007   Leg cramps, sleep related 08/05/2010   OBSTRUCTIVE SLEEP APNEA 01/19/2007   does not use CPAP   OSTEOARTHRITIS 06/24/2007   Other postprocedural status(V45.89) 01/19/2007   PERCUTANEOUS TRANSLUMINAL CORONARY ANGIOPLASTY, HX OF 01/19/2007   PLANTAR FASCIITIS, RIGHT 01/19/2007   SHOULDER PAIN, LEFT 03/24/2007  SPINAL STENOSIS, LUMBAR 06/03/2009   TINEA PEDIS 11/15/2009   URI 01/11/2009   No Known Allergies  Social History   Socioeconomic History   Marital status: Married    Spouse name: Not on file   Number of children: Not on file   Years of education: Not on file   Highest education level: 12th grade  Occupational History   Occupation: retired    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  Tobacco Use   Smoking status: Former    Current packs/day: 0.00    Types: Cigarettes    Quit date: 04/14/1983    Years since quitting: 40.4   Smokeless tobacco: Never   Tobacco comments:    stopped 1985  Vaping Use   Vaping status: Never Used  Substance and Sexual Activity   Alcohol use: Yes    Comment: OCCASSIONAL   Drug use: Not Currently    Types: Marijuana    Comment: occ   Sexual activity: Not on file  Other Topics Concern   Not on file  Social History Narrative   Married in 1959 - 75yrs, divorced; married 1974 - 1.5 years, divorced; married 1985 1 daughter 66; 58 step daughters; 2 grandchildren.   Social Drivers of Corporate investment banker Strain: Low Risk  (02/16/2022)   Overall Financial Resource Strain (CARDIA)    Difficulty of Paying Living Expenses: Not  hard at all  Food Insecurity: No Food Insecurity (02/16/2022)   Hunger Vital Sign    Worried About Running Out of Food in the Last Year: Never true    Ran Out of Food in the Last Year: Never true  Transportation Needs: No Transportation Needs (02/16/2022)   PRAPARE - Administrator, Civil Service (Medical): No    Lack of Transportation (Non-Medical): No  Physical Activity: Insufficiently Active (02/16/2022)   Exercise Vital Sign    Days of Exercise per Week: 1 day    Minutes of Exercise per Session: 10 min  Stress: Stress Concern Present (02/16/2022)   [employer] of Occupational Health - Occupational Stress Questionnaire    Feeling of Stress : To some extent  Social Connections: Unknown (02/16/2022)   Social Connection and Isolation Panel [NHANES]    Frequency of Communication with Friends and Family: Patient declined    Frequency of Social Gatherings with Friends and Family: Once a week    Attends Religious Services: Never    Database administrator or Organizations: No    Attends Engineer, structural: Not on file    Marital Status: Married  Recent Concern: Social Connections - Moderately Isolated (01/28/2022)   Social Connection and Isolation Panel [NHANES]    Frequency of Communication with Friends and Family: More than three times a week    Frequency of Social Gatherings with Friends and Family: More than three times a week    Attends Religious Services: Never    Database administrator or Organizations: No    Attends Banker Meetings: Never    Marital Status: Married   Vitals:   09/22/23 1109  BP: 128/80  Pulse: 62  Resp: 16  SpO2: 97%   Body mass index is 27.12 kg/m.  Physical Exam Nursing note reviewed.  Constitutional:      General: He is not in acute distress.    Appearance: He is well-developed.  HENT:     Head: Normocephalic and atraumatic.     Mouth/Throat:     Mouth: Mucous membranes are moist.  Eyes:      Conjunctiva/sclera: Conjunctivae normal.  Cardiovascular:     Rate and Rhythm: Normal rate. Rhythm irregular.     Pulses:          Dorsalis pedis pulses are 2+ on the right side and 2+ on the left side.     Heart sounds: Murmur (Dyastolic I-II/VI RUSB, SEM I/VI LUSB) heard.     Comments: Pedal pitting edema and toes edema. No cyanosis. Pulmonary:     Effort: Pulmonary effort is normal. No respiratory distress.     Breath sounds: Normal breath sounds.  Abdominal:     Palpations: Abdomen is soft. There is no mass.     Tenderness: There is no abdominal tenderness.  Musculoskeletal:     Right lower leg: 1+ Pitting Edema present.     Left lower leg: 1+ Pitting Edema present.     Left foot: Normal capillary refill. Swelling present.     Comments: No significant tenderness upon palpation of left foot joints.  Noted erythema or significant deformities.  Lymphadenopathy:     Cervical: No cervical adenopathy.  Skin:    General: Skin is warm.     Findings: No erythema or rash.  Neurological:     Mental Status: He is alert and oriented to person, place, and time.     Comments: Antalgic, unstable gait assisted with a cane.  Psychiatric:        Mood and Affect: Mood and affect normal.    ASSESSMENT AND PLAN:  [name] was seen today for left foot pain.  Lab Results  Component Value Date   NA 143 09/22/2023   CL 105 09/22/2023   K 4.0 09/22/2023   CO2 22 09/22/2023   BUN 21 09/22/2023   CREATININE 0.84 09/22/2023   EGFR 83 09/22/2023   CALCIUM  9.1 09/22/2023   ALBUMIN 4.3 09/22/2023   GLUCOSE 149 (H) 09/22/2023   Lab Results  Component Value Date   ALT 13 09/22/2023   AST 19 09/22/2023   ALKPHOS 98 09/22/2023   BILITOT 0.5 09/22/2023   Lab Results  Component Value Date   HGBA1C 7.9 (H) 09/22/2023   Foot pain, left Sudden onset and rapidly improved, he is not having significant pain today. We discussed possible etiologies, even though he has not had known trauma, recommend  x-ray. Peripheral neuropathy could be contributing to pain. Caution with NSAIDs. Recommend trying topical IcyHot or Aspercreme. Fall precautions to continue.  -     DG Foot Complete Left; Future  Diabetes mellitus type 2 with neurological manifestations [facility]) Assessment & Plan: Last hemoglobin A1c 6.9 in 10/2022. Continue glipizide  5 mg, try to take it daily. Periodic eye exam and foot care to continue. F/U in 6 months.  Orders: -     Comprehensive metabolic panel with GFR; Future -     Microalbumin / creatinine urine ratio; Future -     Hemoglobin A1c; Future  Hypertension with heart disease Assessment & Plan: Problem is adequately controlled. Continue valsartan  80 mg daily and Imdur  30 mg daily as well as low-salt diet.  Orders: -     Comprehensive metabolic panel with GFR; Future  Atrial fibrillation, unspecified type [facility]) Assessment & Plan: Rate controlled. He is not taking Eliquis  and not interested in resuming it. He does not want to follow-up with cardiologist. We discussed possible complications of atrial fibrillation. Instructed about warning signs.   Chronic heart failure with preserved ejection fraction [facility]) Assessment & Plan: Last echo in  08/2017 LVEF 60 to 65% and grade 2 diastolic dysfunction. He could not afford Jardiance . Continue furosemide  40 mg twice daily and low-salt diet.  Chronic obstructive pulmonary disease, unspecified COPD type (HCC) Assessment & Plan: Asymptomatic at this time. He has not needed to use Combivent  in the past few months.  I spent a total of 41 minutes in both face to face and non face to face activities for this visit on the date of this encounter. During this time history was obtained and documented, examination was performed, prior labs reviewed, and assessment/plan discussed.  Return in about 6 months (around 03/23/2024).  I, Fritz Jewel Wierda, acting as a scribe for Hendricks Schwandt [place], MD., have documented all relevant  documentation on the behalf of Shanica Castellanos [place], MD, as directed by  Jariel Drost [place], MD while in the presence of Kimiyah Blick [place], MD.   I, Luci Bellucci [place], MD, have reviewed all documentation for this visit. The documentation on 09/22/23 for the exam, diagnosis, procedures, and orders are all accurate and complete.  Suellen Durocher G. [place], MD  [facility]. [place] office.

## 2023-09-22 NOTE — Assessment & Plan Note (Signed)
 Problem is adequately controlled. Continue valsartan  80 mg daily and Imdur  30 mg daily as well as low-salt diet.

## 2023-09-22 NOTE — Patient Instructions (Addendum)
 A few things to remember from today's visit:  Diabetes mellitus type 2 with neurological manifestations (HCC) - Plan: Comprehensive metabolic panel with GFR, Microalbumin / creatinine urine ratio, Hemoglobin A1c  Hypertension with heart disease - Plan: Comprehensive metabolic panel with GFR  Generalized osteoarthritis of multiple sites  Foot pain, left - Plan: DG Foot Complete Left No changes today. ? Neuropathy. Continue good foot care.  If you need refills for medications you take chronically, please call your pharmacy. Do not use My Chart to request refills or for acute issues that need immediate attention. If you send a my chart message, it may take a few days to be addressed, specially if I am not in the office.  Please be sure medication list is accurate. If a new problem present, please set up appointment sooner than planned today.

## 2023-09-23 ENCOUNTER — Ambulatory Visit: Payer: Self-pay | Admitting: Family Medicine

## 2023-09-23 LAB — COMPREHENSIVE METABOLIC PANEL WITH GFR
ALT: 13 IU/L (ref 0–44)
AST: 19 IU/L (ref 0–40)
Albumin: 4.3 g/dL (ref 3.7–4.7)
Alkaline Phosphatase: 98 IU/L (ref 44–121)
BUN/Creatinine Ratio: 25 — ABNORMAL HIGH (ref 10–24)
BUN: 21 mg/dL (ref 8–27)
Bilirubin Total: 0.5 mg/dL (ref 0.0–1.2)
CO2: 22 mmol/L (ref 20–29)
Calcium: 9.1 mg/dL (ref 8.6–10.2)
Chloride: 105 mmol/L (ref 96–106)
Creatinine, Ser: 0.84 mg/dL (ref 0.76–1.27)
Globulin, Total: 2.2 g/dL (ref 1.5–4.5)
Glucose: 149 mg/dL — ABNORMAL HIGH (ref 70–99)
Potassium: 4 mmol/L (ref 3.5–5.2)
Sodium: 143 mmol/L (ref 134–144)
Total Protein: 6.5 g/dL (ref 6.0–8.5)
eGFR: 83 mL/min/{1.73_m2} (ref >59)

## 2023-09-23 LAB — HEMOGLOBIN A1C
Est. average glucose Bld gHb Est-mCnc: 180 mg/dL
Hgb A1c MFr Bld: 7.9 % — ABNORMAL HIGH (ref 4.8–5.6)

## 2023-09-23 LAB — MICROALBUMIN / CREATININE URINE RATIO
Creatinine, Urine: 82.8 mg/dL
Microalb/Creat Ratio: 81 mg/g{creat} — ABNORMAL HIGH (ref 0–29)
Microalbumin, Urine: 67.4 ug/mL

## 2023-10-07 ENCOUNTER — Other Ambulatory Visit: Payer: Self-pay | Admitting: Family Medicine

## 2023-10-07 DIAGNOSIS — R252 Cramp and spasm: Secondary | ICD-10-CM

## 2023-12-14 ENCOUNTER — Other Ambulatory Visit: Payer: Self-pay | Admitting: Family Medicine

## 2023-12-14 DIAGNOSIS — I119 Hypertensive heart disease without heart failure: Secondary | ICD-10-CM

## 2023-12-14 DIAGNOSIS — I25118 Atherosclerotic heart disease of native coronary artery with other forms of angina pectoris: Secondary | ICD-10-CM

## 2024-01-05 ENCOUNTER — Other Ambulatory Visit: Payer: Self-pay | Admitting: Family Medicine

## 2024-01-05 DIAGNOSIS — R600 Localized edema: Secondary | ICD-10-CM

## 2024-01-05 DIAGNOSIS — I5032 Chronic diastolic (congestive) heart failure: Secondary | ICD-10-CM

## 2024-01-21 ENCOUNTER — Ambulatory Visit

## 2024-01-27 ENCOUNTER — Ambulatory Visit (INDEPENDENT_AMBULATORY_CARE_PROVIDER_SITE_OTHER)

## 2024-01-27 DIAGNOSIS — Z23 Encounter for immunization: Secondary | ICD-10-CM

## 2024-01-27 NOTE — Progress Notes (Cosign Needed Addendum)
 Pt received there flu vaccine today and there were no questions or concerns.  Medical screening examination/treatment/procedure(s) were performed by non-physician practitioner and as supervising physician I was immediately available for consultation/collaboration.  I agree with above. Karlynn Noel, MD

## 2024-02-06 ENCOUNTER — Inpatient Hospital Stay (HOSPITAL_COMMUNITY)
Admission: EM | Admit: 2024-02-06 | Discharge: 2024-02-17 | DRG: 521 | Disposition: A | Discharge status: Skilled Nursing Facility | Attending: Family Medicine | Admitting: Family Medicine

## 2024-02-06 ENCOUNTER — Emergency Department (HOSPITAL_COMMUNITY)

## 2024-02-06 ENCOUNTER — Other Ambulatory Visit: Payer: Self-pay

## 2024-02-06 ENCOUNTER — Encounter (HOSPITAL_COMMUNITY): Payer: Self-pay

## 2024-02-06 DIAGNOSIS — N138 Other obstructive and reflux uropathy: Secondary | ICD-10-CM | POA: Diagnosis present

## 2024-02-06 DIAGNOSIS — I4821 Permanent atrial fibrillation: Secondary | ICD-10-CM | POA: Diagnosis present

## 2024-02-06 DIAGNOSIS — E440 Moderate protein-calorie malnutrition: Secondary | ICD-10-CM | POA: Diagnosis present

## 2024-02-06 DIAGNOSIS — Z9842 Cataract extraction status, left eye: Secondary | ICD-10-CM

## 2024-02-06 DIAGNOSIS — I25110 Atherosclerotic heart disease of native coronary artery with unstable angina pectoris: Secondary | ICD-10-CM | POA: Diagnosis present

## 2024-02-06 DIAGNOSIS — Z955 Presence of coronary angioplasty implant and graft: Secondary | ICD-10-CM

## 2024-02-06 DIAGNOSIS — S72001A Fracture of unspecified part of neck of right femur, initial encounter for closed fracture: Principal | ICD-10-CM

## 2024-02-06 DIAGNOSIS — I2510 Atherosclerotic heart disease of native coronary artery without angina pectoris: Secondary | ICD-10-CM | POA: Diagnosis present

## 2024-02-06 DIAGNOSIS — E785 Hyperlipidemia, unspecified: Secondary | ICD-10-CM | POA: Diagnosis present

## 2024-02-06 DIAGNOSIS — I25119 Atherosclerotic heart disease of native coronary artery with unspecified angina pectoris: Secondary | ICD-10-CM | POA: Diagnosis present

## 2024-02-06 DIAGNOSIS — Z79899 Other long term (current) drug therapy: Secondary | ICD-10-CM

## 2024-02-06 DIAGNOSIS — I350 Nonrheumatic aortic (valve) stenosis: Secondary | ICD-10-CM

## 2024-02-06 DIAGNOSIS — Z9181 History of falling: Secondary | ICD-10-CM

## 2024-02-06 DIAGNOSIS — W010XXA Fall on same level from slipping, tripping and stumbling without subsequent striking against object, initial encounter: Secondary | ICD-10-CM | POA: Diagnosis present

## 2024-02-06 DIAGNOSIS — S72011A Unspecified intracapsular fracture of right femur, initial encounter for closed fracture: Secondary | ICD-10-CM | POA: Diagnosis not present

## 2024-02-06 DIAGNOSIS — Z743 Need for continuous supervision: Secondary | ICD-10-CM | POA: Diagnosis not present

## 2024-02-06 DIAGNOSIS — I5030 Unspecified diastolic (congestive) heart failure: Secondary | ICD-10-CM | POA: Diagnosis present

## 2024-02-06 DIAGNOSIS — D62 Acute posthemorrhagic anemia: Secondary | ICD-10-CM | POA: Diagnosis not present

## 2024-02-06 DIAGNOSIS — M160 Bilateral primary osteoarthritis of hip: Secondary | ICD-10-CM | POA: Diagnosis not present

## 2024-02-06 DIAGNOSIS — N401 Enlarged prostate with lower urinary tract symptoms: Secondary | ICD-10-CM | POA: Diagnosis present

## 2024-02-06 DIAGNOSIS — Z8261 Family history of arthritis: Secondary | ICD-10-CM

## 2024-02-06 DIAGNOSIS — F05 Delirium due to known physiological condition: Secondary | ICD-10-CM | POA: Diagnosis not present

## 2024-02-06 DIAGNOSIS — R0902 Hypoxemia: Secondary | ICD-10-CM | POA: Diagnosis not present

## 2024-02-06 DIAGNOSIS — Z87891 Personal history of nicotine dependence: Secondary | ICD-10-CM

## 2024-02-06 DIAGNOSIS — S72009A Fracture of unspecified part of neck of unspecified femur, initial encounter for closed fracture: Secondary | ICD-10-CM | POA: Diagnosis not present

## 2024-02-06 DIAGNOSIS — J440 Chronic obstructive pulmonary disease with acute lower respiratory infection: Secondary | ICD-10-CM | POA: Diagnosis present

## 2024-02-06 DIAGNOSIS — E1140 Type 2 diabetes mellitus with diabetic neuropathy, unspecified: Secondary | ICD-10-CM | POA: Diagnosis present

## 2024-02-06 DIAGNOSIS — I4891 Unspecified atrial fibrillation: Secondary | ICD-10-CM | POA: Insufficient documentation

## 2024-02-06 DIAGNOSIS — I080 Rheumatic disorders of both mitral and aortic valves: Secondary | ICD-10-CM | POA: Diagnosis present

## 2024-02-06 DIAGNOSIS — Z8249 Family history of ischemic heart disease and other diseases of the circulatory system: Secondary | ICD-10-CM

## 2024-02-06 DIAGNOSIS — Z7901 Long term (current) use of anticoagulants: Secondary | ICD-10-CM

## 2024-02-06 DIAGNOSIS — I252 Old myocardial infarction: Secondary | ICD-10-CM

## 2024-02-06 DIAGNOSIS — J189 Pneumonia, unspecified organism: Secondary | ICD-10-CM | POA: Diagnosis not present

## 2024-02-06 DIAGNOSIS — E1165 Type 2 diabetes mellitus with hyperglycemia: Secondary | ICD-10-CM | POA: Diagnosis present

## 2024-02-06 DIAGNOSIS — M25559 Pain in unspecified hip: Secondary | ICD-10-CM | POA: Diagnosis not present

## 2024-02-06 DIAGNOSIS — Z833 Family history of diabetes mellitus: Secondary | ICD-10-CM

## 2024-02-06 DIAGNOSIS — W19XXXA Unspecified fall, initial encounter: Secondary | ICD-10-CM | POA: Diagnosis not present

## 2024-02-06 DIAGNOSIS — Z791 Long term (current) use of non-steroidal anti-inflammatories (NSAID): Secondary | ICD-10-CM

## 2024-02-06 DIAGNOSIS — Z9861 Coronary angioplasty status: Secondary | ICD-10-CM

## 2024-02-06 DIAGNOSIS — Z6825 Body mass index (BMI) 25.0-25.9, adult: Secondary | ICD-10-CM

## 2024-02-06 DIAGNOSIS — I9789 Other postprocedural complications and disorders of the circulatory system, not elsewhere classified: Secondary | ICD-10-CM | POA: Diagnosis not present

## 2024-02-06 DIAGNOSIS — F1090 Alcohol use, unspecified, uncomplicated: Secondary | ICD-10-CM | POA: Diagnosis present

## 2024-02-06 DIAGNOSIS — G4733 Obstructive sleep apnea (adult) (pediatric): Secondary | ICD-10-CM | POA: Diagnosis present

## 2024-02-06 DIAGNOSIS — Y92000 Kitchen of unspecified non-institutional (private) residence as  the place of occurrence of the external cause: Secondary | ICD-10-CM

## 2024-02-06 DIAGNOSIS — Z7984 Long term (current) use of oral hypoglycemic drugs: Secondary | ICD-10-CM

## 2024-02-06 DIAGNOSIS — I5032 Chronic diastolic (congestive) heart failure: Secondary | ICD-10-CM | POA: Diagnosis present

## 2024-02-06 DIAGNOSIS — Z01810 Encounter for preprocedural cardiovascular examination: Secondary | ICD-10-CM

## 2024-02-06 DIAGNOSIS — Z66 Do not resuscitate: Secondary | ICD-10-CM | POA: Diagnosis present

## 2024-02-06 DIAGNOSIS — G9341 Metabolic encephalopathy: Secondary | ICD-10-CM | POA: Diagnosis not present

## 2024-02-06 DIAGNOSIS — I119 Hypertensive heart disease without heart failure: Secondary | ICD-10-CM | POA: Diagnosis present

## 2024-02-06 DIAGNOSIS — I110 Hypertensive heart disease with heart failure: Secondary | ICD-10-CM | POA: Diagnosis present

## 2024-02-06 MED ORDER — MORPHINE SULFATE (PF) 4 MG/ML IV SOLN
4.0000 mg | Freq: Once | INTRAVENOUS | Status: AC
  Administered 2024-02-07: 4 mg via INTRAVENOUS
  Filled 2024-02-06: qty 1

## 2024-02-06 MED ORDER — ONDANSETRON HCL 4 MG/2ML IJ SOLN
4.0000 mg | Freq: Once | INTRAMUSCULAR | Status: AC
  Administered 2024-02-07: 4 mg via INTRAVENOUS
  Filled 2024-02-06: qty 2

## 2024-02-06 NOTE — ED Triage Notes (Signed)
 Pt coming in from home where he fell in the kitchen. Pt reports losing balance and fell. Pt fell on right hip and is having pain there. Pt took Tylenol  and Ibprofen at home. Unable to wt bear . Pt given 100 mcg of fentanyl  by gems. Pt has been in afib for ems  Hr 60-100 Cbg 233   Bp 140/80  Spo2 97% ra  Rr 16

## 2024-02-06 NOTE — ED Provider Notes (Signed)
 [employer] EMERGENCY DEPARTMENT AT [facility] Provider Note   CSN: [id number] Arrival date & time: 02/06/24  2304     Patient presents with: No chief complaint on file.   [name] is a 89 y.o. male.  {Add pertinent medical, surgical, social history, OB history to HPI:32947} 89 yo M with a cc of a fall.  Patient said he was in the kitchen and he turned and he lost his balance and fell onto his right side.  He did strike his head but does not think it hurts.  Denies neck pain chest pain abdominal pain.  Unfortunate was having a lot of pain points to the right groin as area of discomfort.  He was unable to get up and walk.  He denies blood thinner use.        Prior to Admission medications   Medication Sig Start Date End Date Taking? Authorizing Provider  acetaminophen  (TYLENOL ) 500 MG tablet Take 500 mg by mouth every 6 (six) hours as needed.    [provider]  alfuzosin  (UROXATRAL ) 10 MG 24 hr tablet TAKE 1 TABLET (10 MG TOTAL) BY MOUTH DAILY WITH BREAKFAST. 03/23/23   Jordan, [name], MD  Blood Glucose Monitoring Suppl (ACCU-CHEK AVIVA CONNECT) w/Device KIT 1 Device by Does not apply route daily. 03/01/20   Jordan, [name], MD  Blood Glucose Monitoring Suppl (ONE TOUCH ULTRA 2) w/Device KIT Daily as directed. 02/23/22   Jordan, [name], MD  citalopram  (CELEXA ) 10 MG tablet Take 1 tablet (10 mg total) by mouth daily. 12/16/22   Jordan, [name], MD  Cyanocobalamin  (B-12) 1000 MCG TABS Take 1,000 mcg by mouth daily.    [provider]  finasteride  (PROSCAR ) 5 MG tablet TAKE 1 TABLET BY MOUTH EVERY DAY 03/29/23   Jordan, [name], MD  fluticasone  (FLONASE ) 50 MCG/ACT nasal spray PLACE 1 SPRAY INTO BOTH NOSTRILS 2 (TWO) TIMES DAILY 05/14/23   Jordan, [name], MD  furosemide  (LASIX ) 40 MG tablet TAKE 1 TABLET BY MOUTH TWICE A DAY 01/06/24   Jordan, [name], MD  glipiZIDE  (GLUCOTROL ) 5 MG tablet TAKE 1 TABLET BY MOUTH DAILY BEFORE BREAKFAST. 20-30 MINUTES BEFORE  MEAL. 08/02/23   Jordan, [name], MD  Ipratropium-Albuterol  (COMBIVENT  RESPIMAT) 20-100 MCG/ACT AERS respimat Inhale 1 puff into the lungs every 6 (six) hours as needed for wheezing. 12/16/22   Jordan, [name], MD  isosorbide  mononitrate (IMDUR ) 30 MG 24 hr tablet TAKE 1 TABLET BY MOUTH EVERY DAY 12/14/23   Jordan, [name], MD  Lancet Devices (LANCET DEVICE WITH EJECTOR) MISC 1 Device by Does not apply route daily. 08/15/19   Jordan, [name], MD  loperamide (IMODIUM A-D) 2 MG tablet Take 2 mg by mouth 4 (four) times daily as needed for diarrhea or loose stools.    [provider]  nitroGLYCERIN  (NITROSTAT ) 0.4 MG SL tablet Place 1 tablet (0.4 mg total) under the tongue every 5 (five) minutes as needed for chest pain. 08/13/20   Jordan, [name], MD  OneTouch Delica Lancets 33G MISC USE TO CHECK BLOOD SUGAR DAILY AND PRN 08/08/19   Jordan, [name], MD  [facility] ULTRA test strip USE TO CHECK BLOOD SUGAR ONCE DAILY E11.9 09/22/21   Jordan, [name], MD  tizanidine  (ZANAFLEX ) 2 MG capsule TAKE 4 CAPSULES (8 MG TOTAL) BY MOUTH AT BEDTIME AS NEEDED FOR MUSCLE SPASMS. 10/08/23   Jordan, [name], MD  valsartan  (DIOVAN ) 80 MG tablet TAKE 1 TABLET BY  MOUTH EVERY DAY 06/21/23   Jordan, [name], MD    Allergies: Patient has no known allergies.    Review of Systems  Updated Vital Signs BP 126/88 (BP Location: Right Arm)   Pulse 89   Temp 98.2 F (36.8 C) (Oral)   Resp 20   SpO2 95%   Physical Exam Vitals and nursing note reviewed.  Constitutional:      Appearance: He is well-developed.  HENT:     Head: Normocephalic and atraumatic.  Eyes:     Pupils: Pupils are equal, round, and reactive to light.  Neck:     Vascular: No JVD.  Cardiovascular:     Rate and Rhythm: Normal rate and regular rhythm.     Heart sounds: No murmur heard.    No friction rub. No gallop.  Pulmonary:     Effort: No respiratory distress.     Breath sounds: No wheezing.  Abdominal:     General: There is no distension.      Tenderness: There is no abdominal tenderness. There is no guarding or rebound.  Musculoskeletal:        General: Normal range of motion.     Cervical back: Normal range of motion and neck supple.     Comments: Pulse motor and sensation intact to the right lower extremity.  Pain with internal and external rotation of the leg.  No obvious pain at the knee no pain with compression of the pelvis.  Palpated from head to toe without obvious other noted areas of bony tenderness.  Skin:    Coloration: Skin is not pale.     Findings: No rash.  Neurological:     Mental Status: He is alert and oriented to person, place, and time.  Psychiatric:        Behavior: Behavior normal.     (all labs ordered are listed, but only abnormal results are displayed) Labs Reviewed  CBC WITH DIFFERENTIAL/PLATELET  COMPREHENSIVE METABOLIC PANEL WITH GFR    EKG: None  Radiology: No results found.  {Document cardiac monitor, telemetry assessment procedure when appropriate:32947} Procedures   Medications Ordered in the ED - No data to display    {Click here for ABCD2, HEART and other calculators REFRESH Note before signing:1}                              Medical Decision Making Amount and/or Complexity of Data Reviewed Labs: ordered. Radiology: ordered. ECG/medicine tests: ordered.   89 yo M with a chief complaint of a fall.  Nonsyncopal by history complaining of right hip pain.  I think likely fracture by history and exam.  Will obtain a plain film.  He did strike his head CT of the head.  Blood work.    {Document critical care time when appropriate  Document review of labs and clinical decision tools ie CHADS2VASC2, etc  Document your independent review of radiology images and any outside records  Document your discussion with family members, caretakers and with consultants  Document social determinants of health affecting pt's care  Document your decision making why or why not admission,  treatments were needed:32947:::1}   Final diagnoses:  None    ED Discharge Orders     None

## 2024-02-07 ENCOUNTER — Other Ambulatory Visit: Payer: Self-pay

## 2024-02-07 ENCOUNTER — Inpatient Hospital Stay (HOSPITAL_COMMUNITY)

## 2024-02-07 ENCOUNTER — Encounter (HOSPITAL_COMMUNITY): Payer: Self-pay | Admitting: Internal Medicine

## 2024-02-07 ENCOUNTER — Inpatient Hospital Stay (HOSPITAL_COMMUNITY): Admitting: Anesthesiology

## 2024-02-07 ENCOUNTER — Encounter (HOSPITAL_COMMUNITY)
Admission: EM | Disposition: A | Discharge status: Skilled Nursing Facility | Payer: Self-pay | Source: Home / Self Care | Attending: Family Medicine

## 2024-02-07 DIAGNOSIS — S72009A Fracture of unspecified part of neck of unspecified femur, initial encounter for closed fracture: Secondary | ICD-10-CM | POA: Diagnosis not present

## 2024-02-07 DIAGNOSIS — I509 Heart failure, unspecified: Secondary | ICD-10-CM | POA: Diagnosis not present

## 2024-02-07 DIAGNOSIS — Z7901 Long term (current) use of anticoagulants: Secondary | ICD-10-CM | POA: Diagnosis not present

## 2024-02-07 DIAGNOSIS — S72001D Fracture of unspecified part of neck of right femur, subsequent encounter for closed fracture with routine healing: Secondary | ICD-10-CM | POA: Diagnosis not present

## 2024-02-07 DIAGNOSIS — I4821 Permanent atrial fibrillation: Secondary | ICD-10-CM | POA: Diagnosis not present

## 2024-02-07 DIAGNOSIS — I4891 Unspecified atrial fibrillation: Secondary | ICD-10-CM | POA: Insufficient documentation

## 2024-02-07 DIAGNOSIS — I9789 Other postprocedural complications and disorders of the circulatory system, not elsewhere classified: Secondary | ICD-10-CM | POA: Diagnosis not present

## 2024-02-07 DIAGNOSIS — E782 Mixed hyperlipidemia: Secondary | ICD-10-CM | POA: Diagnosis not present

## 2024-02-07 DIAGNOSIS — I2510 Atherosclerotic heart disease of native coronary artery without angina pectoris: Secondary | ICD-10-CM

## 2024-02-07 DIAGNOSIS — I119 Hypertensive heart disease without heart failure: Secondary | ICD-10-CM | POA: Diagnosis not present

## 2024-02-07 DIAGNOSIS — M6259 Muscle wasting and atrophy, not elsewhere classified, multiple sites: Secondary | ICD-10-CM | POA: Diagnosis not present

## 2024-02-07 DIAGNOSIS — S72001A Fracture of unspecified part of neck of right femur, initial encounter for closed fracture: Secondary | ICD-10-CM

## 2024-02-07 DIAGNOSIS — Z7984 Long term (current) use of oral hypoglycemic drugs: Secondary | ICD-10-CM | POA: Diagnosis not present

## 2024-02-07 DIAGNOSIS — E1165 Type 2 diabetes mellitus with hyperglycemia: Secondary | ICD-10-CM | POA: Diagnosis not present

## 2024-02-07 DIAGNOSIS — M1611 Unilateral primary osteoarthritis, right hip: Secondary | ICD-10-CM | POA: Diagnosis not present

## 2024-02-07 DIAGNOSIS — Z66 Do not resuscitate: Secondary | ICD-10-CM | POA: Diagnosis not present

## 2024-02-07 DIAGNOSIS — M159 Polyosteoarthritis, unspecified: Secondary | ICD-10-CM | POA: Diagnosis not present

## 2024-02-07 DIAGNOSIS — I350 Nonrheumatic aortic (valve) stenosis: Secondary | ICD-10-CM | POA: Diagnosis not present

## 2024-02-07 DIAGNOSIS — F05 Delirium due to known physiological condition: Secondary | ICD-10-CM | POA: Diagnosis not present

## 2024-02-07 DIAGNOSIS — G9341 Metabolic encephalopathy: Secondary | ICD-10-CM | POA: Diagnosis not present

## 2024-02-07 DIAGNOSIS — R2681 Unsteadiness on feet: Secondary | ICD-10-CM | POA: Diagnosis not present

## 2024-02-07 DIAGNOSIS — F331 Major depressive disorder, recurrent, moderate: Secondary | ICD-10-CM | POA: Diagnosis not present

## 2024-02-07 DIAGNOSIS — N401 Enlarged prostate with lower urinary tract symptoms: Secondary | ICD-10-CM | POA: Diagnosis not present

## 2024-02-07 DIAGNOSIS — S72041A Displaced fracture of base of neck of right femur, initial encounter for closed fracture: Secondary | ICD-10-CM | POA: Diagnosis not present

## 2024-02-07 DIAGNOSIS — I10 Essential (primary) hypertension: Secondary | ICD-10-CM

## 2024-02-07 DIAGNOSIS — W010XXA Fall on same level from slipping, tripping and stumbling without subsequent striking against object, initial encounter: Secondary | ICD-10-CM | POA: Diagnosis not present

## 2024-02-07 DIAGNOSIS — R011 Cardiac murmur, unspecified: Secondary | ICD-10-CM | POA: Diagnosis not present

## 2024-02-07 DIAGNOSIS — Z8249 Family history of ischemic heart disease and other diseases of the circulatory system: Secondary | ICD-10-CM | POA: Diagnosis not present

## 2024-02-07 DIAGNOSIS — E440 Moderate protein-calorie malnutrition: Secondary | ICD-10-CM | POA: Diagnosis not present

## 2024-02-07 DIAGNOSIS — M8580 Other specified disorders of bone density and structure, unspecified site: Secondary | ICD-10-CM | POA: Diagnosis not present

## 2024-02-07 DIAGNOSIS — I5032 Chronic diastolic (congestive) heart failure: Secondary | ICD-10-CM | POA: Diagnosis not present

## 2024-02-07 DIAGNOSIS — N138 Other obstructive and reflux uropathy: Secondary | ICD-10-CM | POA: Diagnosis not present

## 2024-02-07 DIAGNOSIS — Z01810 Encounter for preprocedural cardiovascular examination: Secondary | ICD-10-CM | POA: Diagnosis not present

## 2024-02-07 DIAGNOSIS — H6063 Unspecified chronic otitis externa, bilateral: Secondary | ICD-10-CM | POA: Diagnosis not present

## 2024-02-07 DIAGNOSIS — E785 Hyperlipidemia, unspecified: Secondary | ICD-10-CM | POA: Diagnosis not present

## 2024-02-07 DIAGNOSIS — R41841 Cognitive communication deficit: Secondary | ICD-10-CM | POA: Diagnosis not present

## 2024-02-07 DIAGNOSIS — D62 Acute posthemorrhagic anemia: Secondary | ICD-10-CM | POA: Diagnosis not present

## 2024-02-07 DIAGNOSIS — I5030 Unspecified diastolic (congestive) heart failure: Secondary | ICD-10-CM

## 2024-02-07 DIAGNOSIS — Z833 Family history of diabetes mellitus: Secondary | ICD-10-CM | POA: Diagnosis not present

## 2024-02-07 DIAGNOSIS — I080 Rheumatic disorders of both mitral and aortic valves: Secondary | ICD-10-CM | POA: Diagnosis not present

## 2024-02-07 DIAGNOSIS — S72001S Fracture of unspecified part of neck of right femur, sequela: Secondary | ICD-10-CM | POA: Diagnosis not present

## 2024-02-07 DIAGNOSIS — J440 Chronic obstructive pulmonary disease with acute lower respiratory infection: Secondary | ICD-10-CM | POA: Diagnosis not present

## 2024-02-07 DIAGNOSIS — E1140 Type 2 diabetes mellitus with diabetic neuropathy, unspecified: Secondary | ICD-10-CM | POA: Diagnosis not present

## 2024-02-07 DIAGNOSIS — M6281 Muscle weakness (generalized): Secondary | ICD-10-CM | POA: Diagnosis not present

## 2024-02-07 DIAGNOSIS — J449 Chronic obstructive pulmonary disease, unspecified: Secondary | ICD-10-CM | POA: Diagnosis not present

## 2024-02-07 DIAGNOSIS — G4733 Obstructive sleep apnea (adult) (pediatric): Secondary | ICD-10-CM | POA: Diagnosis not present

## 2024-02-07 DIAGNOSIS — Z741 Need for assistance with personal care: Secondary | ICD-10-CM | POA: Diagnosis not present

## 2024-02-07 DIAGNOSIS — Z9181 History of falling: Secondary | ICD-10-CM | POA: Diagnosis not present

## 2024-02-07 DIAGNOSIS — Y92000 Kitchen of unspecified non-institutional (private) residence as  the place of occurrence of the external cause: Secondary | ICD-10-CM | POA: Diagnosis not present

## 2024-02-07 DIAGNOSIS — I110 Hypertensive heart disease with heart failure: Secondary | ICD-10-CM | POA: Diagnosis not present

## 2024-02-07 DIAGNOSIS — J189 Pneumonia, unspecified organism: Secondary | ICD-10-CM | POA: Diagnosis not present

## 2024-02-07 DIAGNOSIS — Z87891 Personal history of nicotine dependence: Secondary | ICD-10-CM

## 2024-02-07 DIAGNOSIS — M7989 Other specified soft tissue disorders: Secondary | ICD-10-CM | POA: Diagnosis not present

## 2024-02-07 DIAGNOSIS — Z471 Aftercare following joint replacement surgery: Secondary | ICD-10-CM | POA: Diagnosis not present

## 2024-02-07 DIAGNOSIS — Z79899 Other long term (current) drug therapy: Secondary | ICD-10-CM | POA: Diagnosis not present

## 2024-02-07 DIAGNOSIS — Z96641 Presence of right artificial hip joint: Secondary | ICD-10-CM | POA: Diagnosis not present

## 2024-02-07 DIAGNOSIS — S72011A Unspecified intracapsular fracture of right femur, initial encounter for closed fracture: Secondary | ICD-10-CM | POA: Diagnosis not present

## 2024-02-07 HISTORY — PX: ANTERIOR APPROACH HEMI HIP ARTHROPLASTY: SHX6690

## 2024-02-07 LAB — CBC WITH DIFFERENTIAL/PLATELET
Abs Immature Granulocytes: 0.05 K/uL (ref 0.00–0.07)
Basophils Absolute: 0.1 K/uL (ref 0.0–0.1)
Basophils Relative: 1 %
Eosinophils Absolute: 0 K/uL (ref 0.0–0.5)
Eosinophils Relative: 0 %
HCT: 42 % (ref 39.0–52.0)
Hemoglobin: 13.7 g/dL (ref 13.0–17.0)
Immature Granulocytes: 1 %
Lymphocytes Relative: 7 %
Lymphs Abs: 0.8 K/uL (ref 0.7–4.0)
MCH: 30.9 pg (ref 26.0–34.0)
MCHC: 32.6 g/dL (ref 30.0–36.0)
MCV: 94.6 fL (ref 80.0–100.0)
Monocytes Absolute: 0.7 K/uL (ref 0.1–1.0)
Monocytes Relative: 6 %
Neutro Abs: 9.5 K/uL — ABNORMAL HIGH (ref 1.7–7.7)
Neutrophils Relative %: 85 %
Platelets: 174 K/uL (ref 150–400)
RBC: 4.44 MIL/uL (ref 4.22–5.81)
RDW: 13.1 % (ref 11.5–15.5)
WBC: 11 K/uL — ABNORMAL HIGH (ref 4.0–10.5)
nRBC: 0 % (ref 0.0–0.2)

## 2024-02-07 LAB — COMPREHENSIVE METABOLIC PANEL WITH GFR
ALT: 22 U/L (ref 0–44)
AST: 23 U/L (ref 15–41)
Albumin: 3.5 g/dL (ref 3.5–5.0)
Alkaline Phosphatase: 71 U/L (ref 38–126)
Anion gap: 13 (ref 5–15)
BUN: 19 mg/dL (ref 8–23)
CO2: 23 mmol/L (ref 22–32)
Calcium: 8.6 mg/dL — ABNORMAL LOW (ref 8.9–10.3)
Chloride: 106 mmol/L (ref 98–111)
Creatinine, Ser: 0.78 mg/dL (ref 0.61–1.24)
GFR, Estimated: >60 mL/min (ref >60)
Glucose, Bld: 203 mg/dL — ABNORMAL HIGH (ref 70–99)
Potassium: 3.9 mmol/L (ref 3.5–5.1)
Sodium: 142 mmol/L (ref 135–145)
Total Bilirubin: 1.1 mg/dL (ref 0.0–1.2)
Total Protein: 6.2 g/dL — ABNORMAL LOW (ref 6.5–8.1)

## 2024-02-07 LAB — CBC
HCT: 40.8 % (ref 39.0–52.0)
Hemoglobin: 13.2 g/dL (ref 13.0–17.0)
MCH: 30.8 pg (ref 26.0–34.0)
MCHC: 32.4 g/dL (ref 30.0–36.0)
MCV: 95.3 fL (ref 80.0–100.0)
Platelets: 181 K/uL (ref 150–400)
RBC: 4.28 MIL/uL (ref 4.22–5.81)
RDW: 13.2 % (ref 11.5–15.5)
WBC: 8.5 K/uL (ref 4.0–10.5)
nRBC: 0 % (ref 0.0–0.2)

## 2024-02-07 LAB — BASIC METABOLIC PANEL WITH GFR
Anion gap: 11 (ref 5–15)
BUN: 18 mg/dL (ref 8–23)
CO2: 25 mmol/L (ref 22–32)
Calcium: 8.7 mg/dL — ABNORMAL LOW (ref 8.9–10.3)
Chloride: 107 mmol/L (ref 98–111)
Creatinine, Ser: 0.89 mg/dL (ref 0.61–1.24)
GFR, Estimated: >60 mL/min (ref >60)
Glucose, Bld: 202 mg/dL — ABNORMAL HIGH (ref 70–99)
Potassium: 4.2 mmol/L (ref 3.5–5.1)
Sodium: 143 mmol/L (ref 135–145)

## 2024-02-07 LAB — CBG MONITORING, ED
Glucose-Capillary: 178 mg/dL — ABNORMAL HIGH (ref 70–99)
Glucose-Capillary: 205 mg/dL — ABNORMAL HIGH (ref 70–99)

## 2024-02-07 LAB — ECHOCARDIOGRAM COMPLETE
AR max vel: 0.92 cm2
AV Area VTI: 0.85 cm2
AV Area mean vel: 0.87 cm2
AV Mean grad: 15.8 mmHg
AV Peak grad: 28 mmHg
Ao pk vel: 2.65 m/s
Calc EF: 42.4 %
Height: 63 in
S' Lateral: 3.7 cm
Single Plane A2C EF: 39.6 %
Single Plane A4C EF: 45 %
Weight: 2320.19 [oz_av]

## 2024-02-07 LAB — HEMOGLOBIN A1C
Hgb A1c MFr Bld: 6.8 % — ABNORMAL HIGH (ref 4.8–5.6)
Mean Plasma Glucose: 148.46 mg/dL

## 2024-02-07 LAB — TROPONIN I (HIGH SENSITIVITY)
Troponin I (High Sensitivity): 16 ng/L (ref <18)
Troponin I (High Sensitivity): 17 ng/L (ref <18)

## 2024-02-07 LAB — GLUCOSE, CAPILLARY
Glucose-Capillary: 204 mg/dL — ABNORMAL HIGH (ref 70–99)
Glucose-Capillary: 142 mg/dL — ABNORMAL HIGH (ref 70–99)
Glucose-Capillary: 225 mg/dL — ABNORMAL HIGH (ref 70–99)
Glucose-Capillary: 165 mg/dL — ABNORMAL HIGH (ref 70–99)

## 2024-02-07 LAB — TSH: TSH: 1.307 u[IU]/mL (ref 0.350–4.500)

## 2024-02-07 LAB — SURGICAL PCR SCREEN
MRSA, PCR: NEGATIVE
Staphylococcus aureus: NEGATIVE

## 2024-02-07 SURGERY — HEMIARTHROPLASTY, HIP, DIRECT ANTERIOR APPROACH, FOR FRACTURE
Anesthesia: General | Site: Hip | Laterality: Right

## 2024-02-07 MED ORDER — ESMOLOL HCL 100 MG/10ML IV SOLN
INTRAVENOUS | Status: DC | PRN
  Administered 2024-02-07: 30 mg via INTRAVENOUS

## 2024-02-07 MED ORDER — PHENYLEPHRINE HCL-NACL 20-0.9 MG/250ML-% IV SOLN
INTRAVENOUS | Status: DC | PRN
Start: 2024-02-07 — End: 2024-02-07
  Administered 2024-02-07: 50 ug/min via INTRAVENOUS

## 2024-02-07 MED ORDER — LACTATED RINGERS IV SOLN: INTRAVENOUS | Status: DC | PRN

## 2024-02-07 MED ORDER — ACETAMINOPHEN 10 MG/ML IV SOLN
INTRAVENOUS | Status: DC | PRN
  Administered 2024-02-07: 1000 mg via INTRAVENOUS

## 2024-02-07 MED ORDER — HYDROCODONE-ACETAMINOPHEN 5-325 MG PO TABS
1.0000 | ORAL_TABLET | Freq: Once | ORAL | Status: AC | PRN
  Administered 2024-02-08: 1 via ORAL
  Filled 2024-02-07: qty 1

## 2024-02-07 MED ORDER — METOPROLOL TARTRATE 25 MG PO TABS
25.0000 mg | ORAL_TABLET | Freq: Once | ORAL | Status: AC
  Administered 2024-02-07: 25 mg via ORAL
  Filled 2024-02-07: qty 1

## 2024-02-07 MED ORDER — TRANEXAMIC ACID-NACL 1000-0.7 MG/100ML-% IV SOLN
1000.0000 mg | INTRAVENOUS | Status: AC
  Administered 2024-02-07: 1000 mg via INTRAVENOUS
  Filled 2024-02-07: qty 100

## 2024-02-07 MED ORDER — THIAMINE HCL 100 MG/ML IJ SOLN
100.0000 mg | Freq: Every day | INTRAMUSCULAR | Status: DC
  Administered 2024-02-09: 100 mg via INTRAVENOUS
  Filled 2024-02-07: qty 2

## 2024-02-07 MED ORDER — METOPROLOL TARTRATE 12.5 MG HALF TABLET
12.5000 mg | ORAL_TABLET | Freq: Four times a day (QID) | ORAL | Status: DC
  Administered 2024-02-07 – 2024-02-08 (×3): 12.5 mg via ORAL
  Filled 2024-02-07 (×3): qty 1

## 2024-02-07 MED ORDER — FINASTERIDE 5 MG PO TABS
5.0000 mg | ORAL_TABLET | Freq: Every day | ORAL | Status: DC
  Administered 2024-02-07 – 2024-02-17 (×10): 5 mg via ORAL
  Filled 2024-02-07 (×10): qty 1

## 2024-02-07 MED ORDER — ONDANSETRON HCL 4 MG/2ML IJ SOLN
INTRAMUSCULAR | Status: DC | PRN
  Administered 2024-02-07: 4 mg via INTRAVENOUS

## 2024-02-07 MED ORDER — MORPHINE SULFATE (PF) 2 MG/ML IV SOLN: 0.5000 mg | INTRAVENOUS | Status: DC | PRN

## 2024-02-07 MED ORDER — THIAMINE MONONITRATE 100 MG PO TABS
100.0000 mg | ORAL_TABLET | Freq: Every day | ORAL | Status: DC
  Administered 2024-02-07 – 2024-02-17 (×9): 100 mg via ORAL
  Filled 2024-02-07 (×9): qty 1

## 2024-02-07 MED ORDER — PROPOFOL 10 MG/ML IV BOLUS
INTRAVENOUS | Status: DC | PRN
  Administered 2024-02-07: 80 mg via INTRAVENOUS

## 2024-02-07 MED ORDER — INSULIN ASPART 100 UNIT/ML IJ SOLN
0.0000 [IU] | INTRAMUSCULAR | Status: DC
  Administered 2024-02-07: 2 [IU] via SUBCUTANEOUS
  Administered 2024-02-08: 3 [IU] via SUBCUTANEOUS
  Administered 2024-02-08: 2 [IU] via SUBCUTANEOUS
  Administered 2024-02-08: 1 [IU] via SUBCUTANEOUS
  Administered 2024-02-08: 3 [IU] via SUBCUTANEOUS
  Administered 2024-02-08: 2 [IU] via SUBCUTANEOUS
  Administered 2024-02-08: 1 [IU] via SUBCUTANEOUS
  Administered 2024-02-08: 2 [IU] via SUBCUTANEOUS
  Administered 2024-02-09: 1 [IU] via SUBCUTANEOUS
  Administered 2024-02-09: 2 [IU] via SUBCUTANEOUS
  Administered 2024-02-09: 1 [IU] via SUBCUTANEOUS
  Administered 2024-02-09: 2 [IU] via SUBCUTANEOUS
  Administered 2024-02-09: 1 [IU] via SUBCUTANEOUS
  Administered 2024-02-10: 3 [IU] via SUBCUTANEOUS
  Administered 2024-02-10: 2 [IU] via SUBCUTANEOUS
  Administered 2024-02-10 (×4): 1 [IU] via SUBCUTANEOUS
  Administered 2024-02-11: 2 [IU] via SUBCUTANEOUS
  Administered 2024-02-11: 3 [IU] via SUBCUTANEOUS
  Administered 2024-02-11: 1 [IU] via SUBCUTANEOUS
  Administered 2024-02-12: 2 [IU] via SUBCUTANEOUS
  Administered 2024-02-12: 3 [IU] via SUBCUTANEOUS
  Administered 2024-02-12: 1 [IU] via SUBCUTANEOUS
  Administered 2024-02-12: 2 [IU] via SUBCUTANEOUS
  Administered 2024-02-13: 1 [IU] via SUBCUTANEOUS
  Administered 2024-02-13 (×3): 3 [IU] via SUBCUTANEOUS
  Administered 2024-02-13: 1 [IU] via SUBCUTANEOUS
  Administered 2024-02-13: 2 [IU] via SUBCUTANEOUS
  Administered 2024-02-14: 3 [IU] via SUBCUTANEOUS
  Administered 2024-02-14 (×3): 1 [IU] via SUBCUTANEOUS
  Administered 2024-02-14: 3 [IU] via SUBCUTANEOUS
  Administered 2024-02-14: 4 [IU] via SUBCUTANEOUS
  Administered 2024-02-15: 1 [IU] via SUBCUTANEOUS
  Administered 2024-02-15: 2 [IU] via SUBCUTANEOUS
  Administered 2024-02-15: 3 [IU] via SUBCUTANEOUS
  Administered 2024-02-15: 2 [IU] via SUBCUTANEOUS
  Administered 2024-02-15: 3 [IU] via SUBCUTANEOUS
  Administered 2024-02-15 (×2): 2 [IU] via SUBCUTANEOUS
  Administered 2024-02-16: 1 [IU] via SUBCUTANEOUS
  Administered 2024-02-16: 4 [IU] via SUBCUTANEOUS
  Administered 2024-02-16 (×2): 2 [IU] via SUBCUTANEOUS
  Administered 2024-02-16 – 2024-02-17 (×4): 1 [IU] via SUBCUTANEOUS
  Filled 2024-02-07: qty 2
  Filled 2024-02-07: qty 3
  Filled 2024-02-07: qty 2
  Filled 2024-02-07: qty 1
  Filled 2024-02-07 (×2): qty 3
  Filled 2024-02-07: qty 1
  Filled 2024-02-07: qty 2
  Filled 2024-02-07 (×2): qty 1
  Filled 2024-02-07: qty 2
  Filled 2024-02-07: qty 1
  Filled 2024-02-07: qty 3
  Filled 2024-02-07 (×2): qty 2
  Filled 2024-02-07: qty 4
  Filled 2024-02-07: qty 2
  Filled 2024-02-07: qty 3
  Filled 2024-02-07: qty 1
  Filled 2024-02-07: qty 4
  Filled 2024-02-07 (×2): qty 1
  Filled 2024-02-07: qty 3

## 2024-02-07 MED ORDER — ISOSORBIDE MONONITRATE ER 30 MG PO TB24
30.0000 mg | ORAL_TABLET | Freq: Every day | ORAL | Status: DC
  Administered 2024-02-07 – 2024-02-08 (×2): 30 mg via ORAL
  Filled 2024-02-07 (×2): qty 1

## 2024-02-07 MED ORDER — INSULIN ASPART 100 UNIT/ML IJ SOLN
0.0000 [IU] | INTRAMUSCULAR | Status: DC | PRN
  Administered 2024-02-07: 2 [IU] via SUBCUTANEOUS

## 2024-02-07 MED ORDER — FENTANYL CITRATE (PF) 250 MCG/5ML IJ SOLN
INTRAMUSCULAR | Status: DC | PRN
  Administered 2024-02-07: 50 ug via INTRAVENOUS
  Administered 2024-02-07 (×2): 25 ug via INTRAVENOUS

## 2024-02-07 MED ORDER — LIDOCAINE 2% (20 MG/ML) 5 ML SYRINGE
INTRAMUSCULAR | Status: DC | PRN
  Administered 2024-02-07: 100 mg via INTRAVENOUS

## 2024-02-07 MED ORDER — VANCOMYCIN HCL 1000 MG IV SOLR
INTRAVENOUS | Status: AC
  Filled 2024-02-07: qty 20

## 2024-02-07 MED ORDER — INSULIN ASPART 100 UNIT/ML IJ SOLN
INTRAMUSCULAR | Status: AC
  Filled 2024-02-07: qty 1

## 2024-02-07 MED ORDER — FENTANYL CITRATE (PF) 100 MCG/2ML IJ SOLN
INTRAMUSCULAR | Status: AC
  Filled 2024-02-07: qty 2

## 2024-02-07 MED ORDER — FOLIC ACID 1 MG PO TABS
1.0000 mg | ORAL_TABLET | Freq: Every day | ORAL | Status: DC
  Administered 2024-02-07 – 2024-02-17 (×10): 1 mg via ORAL
  Filled 2024-02-07 (×10): qty 1

## 2024-02-07 MED ORDER — CHLORHEXIDINE GLUCONATE 4 % EX SOLN
60.0000 mL | Freq: Once | CUTANEOUS | Status: DC
  Filled 2024-02-07: qty 60

## 2024-02-07 MED ORDER — CHLORHEXIDINE GLUCONATE 0.12 % MT SOLN: 15.0000 mL | Freq: Once | OROMUCOSAL | Status: AC

## 2024-02-07 MED ORDER — ROCURONIUM BROMIDE 10 MG/ML (PF) SYRINGE
PREFILLED_SYRINGE | INTRAVENOUS | Status: DC | PRN
  Administered 2024-02-07: 50 mg via INTRAVENOUS

## 2024-02-07 MED ORDER — ORAL CARE MOUTH RINSE: 15.0000 mL | Freq: Once | OROMUCOSAL | Status: AC

## 2024-02-07 MED ORDER — PHENYLEPHRINE 80 MCG/ML (10ML) SYRINGE FOR IV PUSH (FOR BLOOD PRESSURE SUPPORT)
PREFILLED_SYRINGE | INTRAVENOUS | Status: DC | PRN
  Administered 2024-02-07 (×6): 160 ug via INTRAVENOUS

## 2024-02-07 MED ORDER — ONDANSETRON HCL 4 MG/2ML IJ SOLN: 4.0000 mg | Freq: Once | INTRAMUSCULAR | Status: DC | PRN

## 2024-02-07 MED ORDER — VANCOMYCIN HCL 1000 MG IV SOLR
INTRAVENOUS | Status: DC | PRN
  Administered 2024-02-07: 1000 mg

## 2024-02-07 MED ORDER — ALBUMIN HUMAN 5 % IV SOLN: INTRAVENOUS | Status: DC | PRN

## 2024-02-07 MED ORDER — FENTANYL CITRATE (PF) 100 MCG/2ML IJ SOLN: 25.0000 ug | INTRAMUSCULAR | Status: DC | PRN

## 2024-02-07 MED ORDER — HYDROCODONE-ACETAMINOPHEN 5-325 MG PO TABS: 1.0000 | ORAL_TABLET | Freq: Four times a day (QID) | ORAL | Status: DC | PRN

## 2024-02-07 MED ORDER — ADULT MULTIVITAMIN W/MINERALS CH
1.0000 | ORAL_TABLET | Freq: Every day | ORAL | Status: DC
  Administered 2024-02-07 – 2024-02-17 (×10): 1 via ORAL
  Filled 2024-02-07 (×10): qty 1

## 2024-02-07 MED ORDER — ACETAMINOPHEN 10 MG/ML IV SOLN: 1000.0000 mg | Freq: Once | INTRAVENOUS | Status: DC | PRN

## 2024-02-07 MED ORDER — CHLORHEXIDINE GLUCONATE 0.12 % MT SOLN
OROMUCOSAL | Status: AC
  Administered 2024-02-07: 15 mL via OROMUCOSAL
  Filled 2024-02-07: qty 15

## 2024-02-07 MED ORDER — SUGAMMADEX SODIUM 200 MG/2ML IV SOLN
INTRAVENOUS | Status: DC | PRN
  Administered 2024-02-07: 200 mg via INTRAVENOUS

## 2024-02-07 MED ORDER — IRBESARTAN 150 MG PO TABS
75.0000 mg | ORAL_TABLET | Freq: Every day | ORAL | Status: DC
  Administered 2024-02-07 – 2024-02-08 (×2): 75 mg via ORAL
  Filled 2024-02-07 (×2): qty 1

## 2024-02-07 MED ORDER — 0.9 % SODIUM CHLORIDE (POUR BTL) OPTIME
TOPICAL | Status: DC | PRN
  Administered 2024-02-07: 1000 mL

## 2024-02-07 MED ORDER — POVIDONE-IODINE 10 % EX SWAB
2.0000 | Freq: Once | CUTANEOUS | Status: AC
  Administered 2024-02-07: 2 via TOPICAL

## 2024-02-07 MED ORDER — LACTATED RINGERS IV SOLN: INTRAVENOUS | Status: DC

## 2024-02-07 MED ORDER — CEFAZOLIN SODIUM-DEXTROSE 2-4 GM/100ML-% IV SOLN
2.0000 g | INTRAVENOUS | Status: AC
  Administered 2024-02-07: 2 g via INTRAVENOUS
  Filled 2024-02-07: qty 100

## 2024-02-07 MED ORDER — PROPOFOL 10 MG/ML IV BOLUS
INTRAVENOUS | Status: AC
Start: 2024-02-07 — End: 2024-02-07
  Filled 2024-02-07: qty 20

## 2024-02-07 SURGICAL SUPPLY — 45 items
BAG COUNTER SPONGE SURGICOUNT (BAG) ×1 IMPLANT
BIT DRILL 7/64X5 DISP (BIT) ×1 IMPLANT
BLADE SAW SGTL 18X1.27X75 (BLADE) ×1 IMPLANT
BNDG COHESIVE 6X5 TAN ST LF (GAUZE/BANDAGES/DRESSINGS) ×1 IMPLANT
CHLORAPREP W/TINT 26 (MISCELLANEOUS) ×1 IMPLANT
COVER PERINEAL POST (MISCELLANEOUS) ×1 IMPLANT
COVER SURGICAL LIGHT HANDLE (MISCELLANEOUS) ×1 IMPLANT
DERMABOND ADVANCED .7 DNX12 (GAUZE/BANDAGES/DRESSINGS) IMPLANT
DRAPE HALF SHEET 40X57 (DRAPES) ×2 IMPLANT
DRAPE IMP U-DRAPE 54X76 (DRAPES) ×2 IMPLANT
DRAPE INCISE IOBAN 85X60 (DRAPES) ×1 IMPLANT
DRAPE STERI IOBAN 125X83 (DRAPES) ×1 IMPLANT
DRAPE SURG 17X23 STRL (DRAPES) ×1 IMPLANT
DRAPE SURG ORHT 6 SPLT 77X108 (DRAPES) IMPLANT
DRAPE U-SHAPE 47X51 STRL (DRAPES) IMPLANT
DRSG AQUACEL AG ADV 3.5X10 (GAUZE/BANDAGES/DRESSINGS) IMPLANT
DRSG MEPILEX POST OP 4X8 (GAUZE/BANDAGES/DRESSINGS) ×1 IMPLANT
ELECT BLADE 6.5 EXT (BLADE) IMPLANT
ELECT CAUTERY BLADE 6.4 (BLADE) IMPLANT
ELECTRODE REM PT RTRN 9FT ADLT (ELECTROSURGICAL) ×1 IMPLANT
GLOVE BIO SURGEON STRL SZ 6.5 (GLOVE) ×3 IMPLANT
GLOVE BIO SURGEON STRL SZ7.5 (GLOVE) ×3 IMPLANT
GLOVE BIOGEL PI IND STRL 6.5 (GLOVE) ×1 IMPLANT
GLOVE BIOGEL PI IND STRL 7.5 (GLOVE) ×1 IMPLANT
GOWN STRL REUS W/ TWL LRG LVL3 (GOWN DISPOSABLE) ×2 IMPLANT
HEAD MOD COCR 28MM HD -3MM NK (Orthopedic Implant) IMPLANT
HOOD PEEL AWAY T7 (MISCELLANEOUS) IMPLANT
KIT BASIN OR (CUSTOM PROCEDURE TRAY) ×1 IMPLANT
KIT TURNOVER KIT B (KITS) ×1 IMPLANT
MANIFOLD NEPTUNE II (INSTRUMENTS) ×1 IMPLANT
PACK TOTAL JOINT (CUSTOM PROCEDURE TRAY) ×1 IMPLANT
PAD ARMBOARD POSITIONER FOAM (MISCELLANEOUS) ×2 IMPLANT
SHELL RINGBLOC BI POLR 28X48MM (Orthopedic Implant) IMPLANT
SOLN 0.9% NACL POUR BTL 1000ML (IV SOLUTION) ×1 IMPLANT
SOLN STERILE WATER BTL 1000 ML (IV SOLUTION) ×1 IMPLANT
STAPLER SKIN PROX 35W (STAPLE) ×1 IMPLANT
STEM FEM CMTLS HIP 14X148 123D (Stem) IMPLANT
SUT ETHIBOND NAB CT1 #1 30IN (SUTURE) ×1 IMPLANT
SUT MNCRL AB 3-0 PS2 18 (SUTURE) IMPLANT
SUT MON AB 2-0 CT1 36 (SUTURE) IMPLANT
SUT MON AB 2-0 SH 27 (SUTURE) ×1 IMPLANT
SUT VIC AB 0 CT1 27XBRD ANBCTR (SUTURE) IMPLANT
SUT VIC AB 1 CT1 27XBRD ANBCTR (SUTURE) ×1 IMPLANT
TOWEL GREEN STERILE (TOWEL DISPOSABLE) ×1 IMPLANT
TUBE SUCT ARGYLE STRL (TUBING) IMPLANT

## 2024-02-07 NOTE — Anesthesia Procedure Notes (Signed)
 Procedure Name: Intubation Date/Time: 02/07/2024 2:47 PM  Performed by: Arvell Edsel HERO, CRNAPre-anesthesia Checklist: Patient identified, Emergency Drugs available, Suction available, Patient being monitored and Timeout performed Patient Re-evaluated:Patient Re-evaluated prior to induction Oxygen Delivery Method: Circle system utilized Preoxygenation: Pre-oxygenation with 100% oxygen Induction Type: IV induction Ventilation: Oral airway inserted - appropriate to patient size and Two handed mask ventilation required Laryngoscope Size: Glidescope and 3 Grade View: Grade I Tube size: 7.0 mm Number of attempts: 1 Airway Equipment and Method: Video-laryngoscopy and Patient positioned with wedge pillow Placement Confirmation: ETT inserted through vocal cords under direct vision, positive ETCO2 and breath sounds checked- equal and bilateral Secured at: 22 cm Tube secured with: Tape Comments: Oral airway two hand mask ventilation. Grade III view per CRNA and attending. Recommend Glidescope 4 for future intubations. Glidescope 3 used for grade 1 view.

## 2024-02-07 NOTE — ED Notes (Signed)
 Breathing is even and unlabored.  Call light within reach encouraged to use when needs arise.

## 2024-02-07 NOTE — Consult Note (Signed)
 Reason for Consult:Right hip fx Referring Physician: Nilda Fendt Time called: 0740 Time at bedside: 0902   [name] is an 89 y.o. male.  HPI: [name] lost his balance and fell. He had immediate right hip pain. His family managed to get hip up but his pain was enough that he was brought to the ED. Workup showed a right hip fx and orthopedic surgery was consulted. He lives at home with family and usually, but not always, uses a cane or RW to ambulate. He is fairly sedentary.  Past Medical History:  Diagnosis Date   BACK PAIN, CHRONIC 04/18/2009   BENIGN PROSTATIC HYPERTROPHY, HX OF 01/19/2007   Carpal tunnel syndrome 03/24/2007   CORONARY ARTERY DISEASE 01/19/2007   DEPRESSION, CHRONIC 06/24/2007   DIABETES MELLITUS 01/19/2007   DIABETES MELLITUS, TYPE II, WITH NEUROLOGICAL COMPLICATIONS 06/17/2010   Diarrhea 02/23/2007   HEART MURMUR, SYSTOLIC 01/02/2008   HERPES SIMPLEX INFECTION 01/19/2007   HYPERLIPIDEMIA 01/19/2007   HYPERTENSION 01/19/2007   Intermediate coronary syndrome (HCC) 08/29/2008   LEG CRAMPS, NOCTURNAL 10/28/2007   Leg cramps, sleep related 08/05/2010   OBSTRUCTIVE SLEEP APNEA 01/19/2007   does not use CPAP   OSTEOARTHRITIS 06/24/2007   Other postprocedural status(V45.89) 01/19/2007   PERCUTANEOUS TRANSLUMINAL CORONARY ANGIOPLASTY, HX OF 01/19/2007   PLANTAR FASCIITIS, RIGHT 01/19/2007   SHOULDER PAIN, LEFT 03/24/2007   SPINAL STENOSIS, LUMBAR 06/03/2009   TINEA PEDIS 11/15/2009   URI 01/11/2009    Past Surgical History:  Procedure Laterality Date   arthroscopy, knee of hx     CARDIAC CATHETERIZATION     CATARACT EXTRACTION Left    COLONOSCOPY     inguinal herniorrhapy, hx     NASAL SINUS SURGERY     percutaneous transluminal coronary angioplasty, hx of  2004   Dr. Charlanne   plastic joint in thumb     PTCA/stent  2004   TONSILLECTOMY     VASECTOMY      Family History  Problem Relation Age of Onset   Arthritis Sister    Diabetes Other    Coronary artery disease  Other     Social History:  reports that he quit smoking about 40 years ago. His smoking use included cigarettes. He has never used smokeless tobacco. He reports current alcohol use. He reports that he does not currently use drugs after having used the following drugs: Marijuana.  Allergies: No Known Allergies  Medications: I have reviewed the patient's current medications.  Results for orders placed or performed during the hospital encounter of 02/06/24 (from the past 48 hours)  CBC with Differential     Status: Abnormal   Collection Time: 02/07/24  2:29 AM  Result Value Ref Range   WBC 11.0 (H) 4.0 - 10.5 K/uL   RBC 4.44 4.22 - 5.81 MIL/uL   Hemoglobin 13.7 13.0 - 17.0 g/dL   HCT 57.9 60.9 - 47.9 %   MCV 94.6 80.0 - 100.0 fL   MCH 30.9 26.0 - 34.0 pg   MCHC 32.6 30.0 - 36.0 g/dL   RDW 86.8 88.4 - 84.4 %   Platelets 174 150 - 400 K/uL   nRBC 0.0 0.0 - 0.2 %   Neutrophils Relative % 85 %   Neutro Abs 9.5 (H) 1.7 - 7.7 K/uL   Lymphocytes Relative 7 %   Lymphs Abs 0.8 0.7 - 4.0 K/uL   Monocytes Relative 6 %   Monocytes Absolute 0.7 0.1 - 1.0 K/uL   Eosinophils Relative 0 %  Eosinophils Absolute 0.0 0.0 - 0.5 K/uL   Basophils Relative 1 %   Basophils Absolute 0.1 0.0 - 0.1 K/uL   Immature Granulocytes 1 %   Abs Immature Granulocytes 0.05 0.00 - 0.07 K/uL    Comment: Performed at [facility], [address], [place], [place] [address]  Comprehensive metabolic panel     Status: Abnormal   Collection Time: 02/07/24  2:29 AM  Result Value Ref Range   Sodium 142 135 - 145 mmol/L   Potassium 3.9 3.5 - 5.1 mmol/L   Chloride 106 98 - 111 mmol/L   CO2 23 22 - 32 mmol/L   Glucose, Bld 203 (H) 70 - 99 mg/dL    Comment: Glucose reference range applies only to samples taken after fasting for at least 8 hours.   BUN 19 8 - 23 mg/dL   Creatinine, Ser 9.21 0.61 - 1.24 mg/dL   Calcium  8.6 (L) 8.9 - 10.3 mg/dL   Total Protein 6.2 (L) 6.5 - 8.1 g/dL   Albumin 3.5 3.5 - 5.0 g/dL    AST 23 15 - 41 U/L   ALT 22 0 - 44 U/L   Alkaline Phosphatase 71 38 - 126 U/L   Total Bilirubin 1.1 0.0 - 1.2 mg/dL   GFR, Estimated >39 >39 mL/min    Comment: (NOTE) Calculated using the CKD-EPI Creatinine Equation (2021)    Anion gap 13 5 - 15    Comment: Performed at [facility], [address] [place], [place] [address]  CBG monitoring, ED     Status: Abnormal   Collection Time: 02/07/24  5:33 AM  Result Value Ref Range   Glucose-Capillary 178 (H) 70 - 99 mg/dL    Comment: Glucose reference range applies only to samples taken after fasting for at least 8 hours.  CBC     Status: None   Collection Time: 02/07/24  6:01 AM  Result Value Ref Range   WBC 8.5 4.0 - 10.5 K/uL   RBC 4.28 4.22 - 5.81 MIL/uL   Hemoglobin 13.2 13.0 - 17.0 g/dL   HCT 59.1 60.9 - 47.9 %   MCV 95.3 80.0 - 100.0 fL   MCH 30.8 26.0 - 34.0 pg   MCHC 32.4 30.0 - 36.0 g/dL   RDW 86.7 88.4 - 84.4 %   Platelets 181 150 - 400 K/uL   nRBC 0.0 0.0 - 0.2 %    Comment: Performed at [facility], [address] [place], [place] [address]  Basic metabolic panel     Status: Abnormal   Collection Time: 02/07/24  6:01 AM  Result Value Ref Range   Sodium 143 135 - 145 mmol/L   Potassium 4.2 3.5 - 5.1 mmol/L   Chloride 107 98 - 111 mmol/L   CO2 25 22 - 32 mmol/L   Glucose, Bld 202 (H) 70 - 99 mg/dL    Comment: Glucose reference range applies only to samples taken after fasting for at least 8 hours.   BUN 18 8 - 23 mg/dL   Creatinine, Ser 9.10 0.61 - 1.24 mg/dL   Calcium  8.7 (L) 8.9 - 10.3 mg/dL   GFR, Estimated >39 >39 mL/min    Comment: (NOTE) Calculated using the CKD-EPI Creatinine Equation (2021)    Anion gap 11 5 - 15    Comment: Performed at [facility], [address] [place], [place] [address]  TSH     Status: None   Collection Time: 02/07/24  6:01 AM  Result Value Ref Range   TSH 1.307 0.350 - 4.500 uIU/mL    Comment: Performed by a 3rd Generation assay with a functional  sensitivity of <=0.01 uIU/mL. Performed at [facility], [address], [place], [place] [address]   Troponin I (High Sensitivity)     Status: None   Collection Time: 02/07/24  6:01 AM  Result Value Ref Range   Troponin I (High Sensitivity) 16 <18 ng/L    Comment: (NOTE) Elevated high sensitivity troponin I (hsTnI) values and significant  changes across serial measurements may suggest ACS but many other  chronic and acute conditions are known to elevate hsTnI results.  Refer to the Links section for chest pain algorithms and additional  guidance. Performed at [facility], [address], [place], [place] [address]   Hemoglobin A1c     Status: Abnormal   Collection Time: 02/07/24  6:01 AM  Result Value Ref Range   Hgb A1c MFr Bld 6.8 (H) 4.8 - 5.6 %    Comment: (NOTE) Diagnosis of Diabetes The following HbA1c ranges recommended by the American Diabetes Association (ADA) may be used as an aid in the diagnosis of diabetes mellitus.  Hemoglobin             Suggested A1C NGSP%              Diagnosis  <5.7                   Non Diabetic  5.7-6.4                Pre-Diabetic  >6.4                   Diabetic  <7.0                   Glycemic control for                       adults with diabetes.     Mean Plasma Glucose 148.46 mg/dL    Comment: Performed at [facility], [address] [place], [place] [address]  Troponin I (High Sensitivity)     Status: None   Collection Time: 02/07/24  7:30 AM  Result Value Ref Range   Troponin I (High Sensitivity) 17 <18 ng/L    Comment: (NOTE) Elevated high sensitivity troponin I (hsTnI) values and significant  changes across serial measurements may suggest ACS but many other  chronic and acute conditions are known to elevate hsTnI results.  Refer to the Links section for chest pain algorithms and additional  guidance. Performed at [facility], [address], [place], [place] [address]   CBG monitoring,  ED     Status: Abnormal   Collection Time: 02/07/24  7:52 AM  Result Value Ref Range   Glucose-Capillary 205 (H) 70 - 99 mg/dL    Comment: Glucose reference range applies only to samples taken after fasting for at least 8 hours.    DG Chest 1 View Result Date: 02/06/2024 EXAM: 1 VIEW XRAY OF THE CHEST 02/06/2024 11:52:21 PM COMPARISON: None available. CLINICAL HISTORY: Hip pain post fall. Hip pain post fall. FINDINGS: LUNGS AND PLEURA: Low lung volumes. Resultant bronchovascular crowding at the hila. Patchy airspace opacity in the right mid lung zone peripherally. No pleural effusion. No pneumothorax. HEART AND MEDIASTINUM: Mild cardiomegaly. Aortic atherosclerosis. BONES AND SOFT TISSUES: Multilevel thoracic osteophytosis. No  acute osseous abnormality. IMPRESSION: 1. Patchy airspace opacity in the right mid lung zone peripherally, possibly infectious in the acute setting . 2. Mild cardiomegaly Electronically signed by: Dorethia Molt MD 02/06/2024 11:58 PM EDT RP Workstation: [id number]   DG Hip Unilat W or Wo Pelvis 2-3 Views Right Result Date: 02/06/2024 EXAM: 2 Or 3 VIEW(S) XRAY OF THE Right Hip 02/06/2024 11:52:21 PM COMPARISON: None available. CLINICAL HISTORY: Hip pain post fall. FINDINGS: BONES AND JOINTS: Impacted acute subcapital right femoral neck fracture noted. Femoral head is still seated within the right acetabulum. Mild bilateral degenerative hip arthritis. SOFT TISSUES: The soft tissues are unremarkable. IMPRESSION: 1. Acute impacted subcapital right femoral neck fracture. No dislocation 2. Mild bilateral degenerative hip arthritis. Electronically signed by: Dorethia Molt MD 02/06/2024 11:56 PM EDT RP Workstation: [id number]   CT Head Wo Contrast Result Date: 02/06/2024 EXAM: CT HEAD WITHOUT CONTRAST 02/06/2024 11:30:47 PM TECHNIQUE: CT of the head was performed without the administration of intravenous contrast. Automated exposure control, iterative reconstruction, and/or weight  based adjustment of the mA/kV was utilized to reduce the radiation dose to as low as reasonably achievable. COMPARISON: None available. CLINICAL HISTORY: Head trauma, minor (Age >= 65y). FINDINGS: BRAIN AND VENTRICLES: Global cortical atrophy. Subcortical and periventricular small vessel ischemic changes. Intracranial atherosclerosis. No acute hemorrhage. No evidence of acute infarct. No hydrocephalus. No extra-axial collection. No mass effect or midline shift. ORBITS: No acute abnormality. SINUSES: No acute abnormality. SOFT TISSUES AND SKULL: No acute soft tissue abnormality. No skull fracture. IMPRESSION: 1. No acute intracranial abnormality. Electronically signed by: Pinkie Pebbles MD 02/06/2024 11:31 PM EDT RP Workstation: [id number]    Review of Systems  HENT:  Negative for ear discharge, ear pain, hearing loss and tinnitus.   Eyes:  Negative for photophobia and pain.  Respiratory:  Negative for cough and shortness of breath.   Cardiovascular:  Negative for chest pain.  Gastrointestinal:  Negative for abdominal pain, nausea and vomiting.  Genitourinary:  Negative for dysuria, flank pain, frequency and urgency.  Musculoskeletal:  Positive for arthralgias (Right hip). Negative for back pain, myalgias and neck pain.  Neurological:  Negative for dizziness and headaches.  Hematological:  Does not bruise/bleed easily.  Psychiatric/Behavioral:  The patient is not nervous/anxious.    Blood pressure 111/85, pulse (!) 50, temperature 98.3 F (36.8 C), temperature source Oral, resp. rate 20, height 5' 3 (1.6 m), weight 65.8 kg, SpO2 95%. Physical Exam Constitutional:      General: He is not in acute distress.    Appearance: He is well-developed. He is not diaphoretic.  HENT:     Head: Normocephalic and atraumatic.  Eyes:     General: No scleral icterus.       Right eye: No discharge.        Left eye: No discharge.     Conjunctiva/sclera: Conjunctivae normal.  Cardiovascular:     Rate and  Rhythm: Normal rate and regular rhythm.  Pulmonary:     Effort: Pulmonary effort is normal. No respiratory distress.  Musculoskeletal:     Cervical back: Normal range of motion.     Comments: RLE No traumatic wounds, ecchymosis, or rash  Mod TTP hip  No knee or ankle effusion  Knee stable to varus/ valgus and anterior/posterior stress  Sens DPN, SPN, TN intact  Motor EHL, ext, flex, evers 5/5  DP 2+, PT 2+, No significant edema  Skin:    General: Skin is warm and dry.  Neurological:     Mental  Status: He is alert.  Psychiatric:        Mood and Affect: Mood normal.        Behavior: Behavior normal.     Assessment/Plan: Right hip fx -- Plan hip hemi today with Dr. Kendal. Please keep NPO.    Ozell DOROTHA Ned, PA-C Orthopedic Surgery [phone] 02/07/2024, 9:12 AM

## 2024-02-07 NOTE — Progress Notes (Signed)
  Echocardiogram 2D Echocardiogram has been performed.  Norleen ORN [facility] 02/07/2024, 2:32 PM

## 2024-02-07 NOTE — Op Note (Signed)
 Orthopaedic Surgery Operative Note (CSN: [id number] ) Date of Surgery: 02/07/2024  Admit Date: 02/06/2024   Diagnoses: Pre-Op Diagnoses: Right displaced femoral neck fracture  Post-Op Diagnosis: Same  Procedures: CPT 27236-Right hip hemiarthroplasty for femoral neck fracture  Surgeons : Primary: Kendal Franky SQUIBB, MD  Assistant: Lauraine Moores, PA-C  Location: OR 3   Anesthesia: General   Antibiotics: Ancef  2g preop with 1 gm vancomycin powder placed topically   Tourniquet time: None    Estimated Blood Loss: 100 mL  Complications:None   Specimens:* No specimens in log *   Implants: Implant Name Type Inv. Item Serial No. Manufacturer Lot No. LRB No. Used Action  STEM FEM CMTLS HIP [id number] [id number] - [id number] Stem STEM FEM CMTLS HIP 14X148 [id number]  [employer](ORTH,TRAU,BIO,SG) [id number] Right 1 Implanted  SHELL RINGBLOC BI POLR 28X48MM - [id number] Orthopedic Implant SHELL RINGBLOC BI POLR 28X48MM  [employer](ORTH,TRAU,BIO,SG) [id number] Right 1 Implanted  HEAD MOD COCR HD - NK - [id number] Orthopedic Implant HEAD MOD COCR HD - NK  [employer](ORTH,TRAU,BIO,SG) [id number] Right 1 Implanted     Indications for Surgery: 89 year old male who sustained a ground-level fall with a right displaced femoral neck fracture.  Due to the unstable nature of his injury I recommend proceeding with right hip hemiarthroplasty.  Risks and benefits were discussed with the patient and his family.  Risks include but not limited to bleeding, infection, periprosthetic fracture, hip dislocation, leg length discrepancy, nerve and blood vessel injury, DVT, even the possibility anesthetic complications.  They agreed to proceed with surgery and consent was obtained.  Operative Findings: 1.  Right hip hemiarthroplasty through anterior approach using [employer] Taperloc reduced distal size 14 high offset stem 2.  [employer] bipolar system with 48 mm acetabular cup and 28 mm inner diameter 3.   [employer] modular head component with a -3 neck.  Procedure: The patient was identified in the preoperative holding area. Consent was confirmed with the patient and their family and all questions were answered. The operative extremity was marked after confirmation with the patient. he was then brought back to the operating room by our anesthesia colleagues.  He was placed under general anesthetic and carefully transferred over to the [facility] table.  Fluoroscopic imaging showed the unstable nature of his injury.  The right hip was then prepped and draped in usual sterile fashion.  A timeout was performed to verify the patient, the procedure, and the extremity.  Preoperative antibiotics were dosed.  A standard anterior approach to the hip was made and carried down through skin and subcutaneous tissue.  I incised through the TFL fascia and developed the interval between the TFL and the sartorius.  I then cauterized the crossing vessels and expose the anterior hip capsule.  I then performed a capsulotomy to expose the anterior portion of the neck.  I released it all the way down to the lesser trochanter.  Performed an intercalary femoral neck cut.  I then removed the femoral head with a corkscrew.  I measured the size of the head and it measured between 47 and 48 mm.  I then trialed the femoral head sizes and I got a good suction fit with a 48 mm head.  I then delivered the femur through the wound by externally rotating it at 110 degrees and delivering it into the incision.  I performed a superior capsular release to deliver the femur even further for adequate broaching.  I then sequentially broached from size 4 up  to a size 14.  I got excellent rotational control and I trialed off this with a standard neck and a -3 modular head.  I reduced the hip and the did have some laxity.  The leg lengths were in good position however I think the offset needed to be increased.  The implants were removed and I decided to go  with a size 14 high offset stem.  This was press-fit it into the proximal femur.  It did sit a little bit more proud than the trial implant and I used a -3 modular neck with a 48 mm acetabular cup.  The hip was relocated.  Final fluoroscopic imaging was obtained.  The incision was copiously irrigated.  A gram of vancomycin powder was placed in the incision.  A layered closure of #1 Ethibond for the capsule and 0 Vicryl 2-0 Monocryl and 3-0 Monocryl with Dermabond for the skin and underlying TFL fascia.  Sterile dressing was applied.  The patient was then awoke from anesthesia and taken to the PACU in stable condition.  Post Op Plan/Instructions: The patient be weightbearing as tolerated to the right lower extremity.  He will have no hip precautions.  We will have him mobilize with physical and Occupational Therapy.  He will receive postoperative Ancef .  He will be placed on oral DOAC for DVT prophylaxis.  I was present and performed the entire surgery.  Lauraine Moores, PA-C did assist me throughout the case. An assistant was necessary given the difficulty in approach, maintenance of reduction and ability to instrument the fracture.   Franky Light, MD [facility]

## 2024-02-07 NOTE — Transfer of Care (Signed)
 Immediate Anesthesia Transfer of Care Note  Patient: [name]  Procedure(s) Performed: HEMIARTHROPLASTY, HIP, DIRECT ANTERIOR APPROACH, FOR FRACTURE (Right: Hip)  Patient Location: PACU  Anesthesia Type:General  Level of Consciousness: awake, alert , and confused  Airway & Oxygen Therapy: Patient Spontanous Breathing and Patient connected to face mask oxygen  Post-op Assessment: Report given to RN, Post -op Vital signs reviewed and stable, Patient moving all extremities, and Patient moving all extremities X 4  Post vital signs: Reviewed and stable  Last Vitals:  Vitals Value Taken Time  BP 127/107 02/07/24 16:24  Temp    Pulse 118 02/07/24 16:27  Resp 17 02/07/24 16:25  SpO2 97 % 02/07/24 16:27  Vitals shown include unfiled device data.  Last Pain:  Vitals:   02/07/24 1255  TempSrc:   PainSc: 0-No pain      Patients Stated Pain Goal: 0 (02/07/24 0801)  Complications: No notable events documented.

## 2024-02-07 NOTE — Consult Note (Addendum)
 Cardiology Consultation   Patient ID: JARROD MCENERY MRN: [id number]; DOB: Oct 06, 1931  Admit date: 02/06/2024 Date of Consult: 02/07/2024  PCP:  Jordan, [name], MD   [facility] Providers Cardiologist:  Redell Leiter, MD      Patient Profile: [name] is a 89 y.o. male with a hx of hypertension, hyperlipidemia, HFpEF, type 2 diabetes, aortic stenosis, COPD, OSA not on CPAP, and CAD with a remote history of PCI in 2004 and 2010 who is being seen 02/07/2024 for a preop evaluation at the request of Gherghe Costin.  History of Present Illness: [name] is an 89 year old male with prior cardiac history listed below.  In 2004 the patient received PCI to the RCA.  In 2010 the patient had an NSTEMI and received PCI with BMS to the RCA.  His most recent echo was done on 08/2017 and showed a normal LVEF of 60 to 65%, moderate LVH, G2 DD, mild MR, mild MAC, mildly dilated left and right atrium, mild aortic regurgitation, moderate aortic valve calcification, LVOT obstruction with a mean gradient of 17, and mildly elevated pulmonary artery systolic pressures.  The patient was last seen by Dr. Leiter on 12/2020.  At that visit there was concerns for possible unstable angina.  The patient was recommended to go for a myocardial perfusion study.  When contacted the patient refused to go for the recommended test.  In 2024 the patient was diagnosed with atrial fibrillation and refused to follow-up with his cardiologist.  Patient presented to the emergency department following a fall complaining of hip pain.  Imaging showed that he had a right hip fracture.  Orthopedic surgery was consulted and is planning a right hip Hemiarthroplasty today.  On interview the patient reported that he is unable to walk further distances or do more than 4 metabolic equivalents of exertion.  Denied any chest pain, shortness of breath, orthopnea.   Labs showed elevated hemoglobin A1c of 6.8, RBC count of 8.5,  hemoglobin of 13.2, potassium of 4.2, sodium of 143, creatinine of 0.89, hypocalcemia of 8.7, and high-sensitivity troponin 16 > 17.  EKG showed atrial fibrillation with RVR and a rate of 122.  Hip x-ray showed mildly displaced fracture of the right femoral neck.  Head CT showed no acute abnormality  Chest x-ray showed Patchy airspace opacity in the right mid lung zone peripherally, and Mild cardiomegaly  Past Medical History:  Diagnosis Date   BACK PAIN, CHRONIC 04/18/2009   BENIGN PROSTATIC HYPERTROPHY, HX OF 01/19/2007   Carpal tunnel syndrome 03/24/2007   CORONARY ARTERY DISEASE 01/19/2007   DEPRESSION, CHRONIC 06/24/2007   DIABETES MELLITUS 01/19/2007   DIABETES MELLITUS, TYPE II, WITH NEUROLOGICAL COMPLICATIONS 06/17/2010   Diarrhea 02/23/2007   HEART MURMUR, SYSTOLIC 01/02/2008   HERPES SIMPLEX INFECTION 01/19/2007   HYPERLIPIDEMIA 01/19/2007   HYPERTENSION 01/19/2007   Intermediate coronary syndrome (HCC) 08/29/2008   LEG CRAMPS, NOCTURNAL 10/28/2007   Leg cramps, sleep related 08/05/2010   OBSTRUCTIVE SLEEP APNEA 01/19/2007   does not use CPAP   OSTEOARTHRITIS 06/24/2007   Other postprocedural status(V45.89) 01/19/2007   PERCUTANEOUS TRANSLUMINAL CORONARY ANGIOPLASTY, HX OF 01/19/2007   PLANTAR FASCIITIS, RIGHT 01/19/2007   SHOULDER PAIN, LEFT 03/24/2007   SPINAL STENOSIS, LUMBAR 06/03/2009   TINEA PEDIS 11/15/2009   URI 01/11/2009    Past Surgical History:  Procedure Laterality Date   arthroscopy, knee of hx     CARDIAC CATHETERIZATION     CATARACT EXTRACTION Left    COLONOSCOPY  inguinal herniorrhapy, hx     NASAL SINUS SURGERY     percutaneous transluminal coronary angioplasty, hx of  2004   Dr. Charlanne   plastic joint in thumb     PTCA/stent  2004   TONSILLECTOMY     VASECTOMY       Home Medications:  Prior to Admission medications   Medication Sig Start Date End Date Taking? Authorizing Provider  acetaminophen  (TYLENOL ) 500 MG tablet Take 1,000 mg by mouth every 6  (six) hours as needed for mild pain (pain score 1-3) or moderate pain (pain score 4-6).   Yes [provider]  alfuzosin  (UROXATRAL ) 10 MG 24 hr tablet TAKE 1 TABLET (10 MG TOTAL) BY MOUTH DAILY WITH BREAKFAST. 03/23/23  Yes Jordan, [name], MD  Cyanocobalamin  (B-12) 1000 MCG TABS Take 1,000 mcg by mouth daily.   Yes [provider]  finasteride  (PROSCAR ) 5 MG tablet TAKE 1 TABLET BY MOUTH EVERY DAY 03/29/23  Yes Jordan, [name], MD  furosemide  (LASIX ) 40 MG tablet TAKE 1 TABLET BY MOUTH TWICE A DAY Patient taking differently: Take 40 mg by mouth every other day. 01/06/24  Yes Jordan, [name], MD  glipiZIDE  (GLUCOTROL ) 5 MG tablet TAKE 1 TABLET BY MOUTH DAILY BEFORE BREAKFAST. 20-30 MINUTES BEFORE MEAL. 08/02/23  Yes Jordan, [name], MD  isosorbide  mononitrate (IMDUR ) 30 MG 24 hr tablet TAKE 1 TABLET BY MOUTH EVERY DAY 12/14/23  Yes Jordan, [name], MD  loperamide (IMODIUM A-D) 2 MG tablet Take 2 mg by mouth 4 (four) times daily as needed for diarrhea or loose stools.   Yes [provider]  tizanidine  (ZANAFLEX ) 2 MG capsule TAKE 4 CAPSULES (8 MG TOTAL) BY MOUTH AT BEDTIME AS NEEDED FOR MUSCLE SPASMS. 10/08/23  Yes Jordan, [name], MD  valsartan  (DIOVAN ) 80 MG tablet TAKE 1 TABLET BY MOUTH EVERY DAY 06/21/23  Yes Jordan, [name], MD  Blood Glucose Monitoring Suppl (ACCU-CHEK AVIVA CONNECT) w/Device KIT 1 Device by Does not apply route daily. 03/01/20   Jordan, [name], MD  Blood Glucose Monitoring Suppl (ONE TOUCH ULTRA 2) w/Device KIT Daily as directed. 02/23/22   Jordan, [name], MD  citalopram  (CELEXA ) 10 MG tablet Take 1 tablet (10 mg total) by mouth daily. Patient not taking: Reported on 02/07/2024 12/16/22   Jordan, [name], MD  fluticasone  (FLONASE ) 50 MCG/ACT nasal spray PLACE 1 SPRAY INTO BOTH NOSTRILS 2 (TWO) TIMES DAILY Patient not taking: Reported on 02/07/2024 05/14/23   Jordan, [name], MD  Ipratropium-Albuterol  (COMBIVENT  RESPIMAT) 20-100 MCG/ACT AERS respimat Inhale  1 puff into the lungs every 6 (six) hours as needed for wheezing. 12/16/22   Jordan, [name], MD  Lancet Devices (LANCET DEVICE WITH EJECTOR) MISC 1 Device by Does not apply route daily. 08/15/19   Jordan, [name], MD  nitroGLYCERIN  (NITROSTAT ) 0.4 MG SL tablet Place 1 tablet (0.4 mg total) under the tongue every 5 (five) minutes as needed for chest pain. Patient not taking: Reported on 02/07/2024 08/13/20   Jordan, [name], MD  OneTouch Delica Lancets 33G MISC USE TO CHECK BLOOD SUGAR DAILY AND PRN 08/08/19   Jordan, [name], MD  [facility] ULTRA test strip USE TO CHECK BLOOD SUGAR ONCE DAILY E11.9 09/22/21   Jordan, [name], MD    Scheduled Meds:  chlorhexidine  60 mL Topical Once   finasteride   5 mg Oral Daily   folic acid  1 mg Oral Daily   insulin  aspart  0-6 Units  Subcutaneous Q4H   irbesartan   75 mg Oral Daily   isosorbide  mononitrate  30 mg Oral Daily   multivitamin with minerals  1 tablet Oral Daily   povidone-iodine  2 Application Topical Once   thiamine  100 mg Oral Daily   Or   thiamine  100 mg Intravenous Daily   triamcinolone  acetonide  40 mg Intramuscular Once   Continuous Infusions:   ceFAZolin  (ANCEF ) IV     tranexamic acid     PRN Meds: HYDROcodone -acetaminophen , morphine  injection  Allergies:   No Known Allergies  Social History:   Social History   Socioeconomic History   Marital status: Married    Spouse name: Not on file   Number of children: Not on file   Years of education: Not on file   Highest education level: 12th grade  Occupational History   Occupation: retired    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  Tobacco Use   Smoking status: Former    Current packs/day: 0.00    Types: Cigarettes    Quit date: 04/14/1983    Years since quitting: 40.8   Smokeless tobacco: Never   Tobacco comments:    stopped 1985  Vaping Use   Vaping status: Never Used  Substance and Sexual Activity   Alcohol use: Yes    Comment: OCCASSIONAL   Drug use: Not Currently    Types:  Marijuana    Comment: occ   Sexual activity: Not on file  Other Topics Concern   Not on file  Social History Narrative   Married in [place] - 76yrs, divorced; married 1974 - 1.5 years, divorced; married 1985 1 daughter 73; 14 step daughters; 2 grandchildren.   Social Drivers of Corporate Investment Banker Strain: Low Risk  (02/16/2022)   Overall Financial Resource Strain (CARDIA)    Difficulty of Paying Living Expenses: Not hard at all  Food Insecurity: No Food Insecurity (02/16/2022)   Hunger Vital Sign    Worried About Running Out of Food in the Last Year: Never true    Ran Out of Food in the Last Year: Never true  Transportation Needs: No Transportation Needs (02/16/2022)   PRAPARE - Administrator, Civil Service (Medical): No    Lack of Transportation (Non-Medical): No  Physical Activity: Insufficiently Active (02/16/2022)   Exercise Vital Sign    Days of Exercise per Week: 1 day    Minutes of Exercise per Session: 10 min  Stress: Stress Concern Present (02/16/2022)   [employer] of Occupational Health - Occupational Stress Questionnaire    Feeling of Stress : To some extent  Social Connections: Unknown (02/16/2022)   Social Connection and Isolation Panel    Frequency of Communication with Friends and Family: Patient declined    Frequency of Social Gatherings with Friends and Family: Once a week    Attends Religious Services: Never    Database Administrator or Organizations: No    Attends Engineer, Structural: Not on file    Marital Status: Married  Recent Concern: Social Connections - Moderately Isolated (01/28/2022)   Social Connection and Isolation Panel    Frequency of Communication with Friends and Family: More than three times a week    Frequency of Social Gatherings with Friends and Family: More than three times a week    Attends Religious Services: Never    Database Administrator or Organizations: No    Attends Banker Meetings:  Never    Marital  Status: Married  Catering Manager Violence: Not At Risk (01/28/2022)   Humiliation, Afraid, Rape, and Kick questionnaire    Fear of Current or Ex-Partner: No    Emotionally Abused: No    Physically Abused: No    Sexually Abused: No    Family History:    Family History  Problem Relation Age of Onset   Arthritis Sister    Diabetes Other    Coronary artery disease Other      ROS:  Please see the history of present illness.  \ All other ROS reviewed and negative.     Physical Exam/Data: Vitals:   02/06/24 2334 02/07/24 0330 02/07/24 0736 02/07/24 0815  BP:  112/72  111/85  Pulse:  87  (!) 50  Resp:  18  20  Temp:  (!) 97.3 F (36.3 C) 98.3 F (36.8 C)   TempSrc:  Oral Oral   SpO2:  95%  95%  Weight: 65.8 kg     Height: 5' 3 (1.6 m)      No intake or output data in the 24 hours ending 02/07/24 1224    02/06/2024   11:34 PM 09/22/2023   11:09 AM 10/28/2022    2:47 PM  Last 3 Weights  Weight (lbs) 145 lb 153 lb 2 oz 155 lb 6 oz  Weight (kg) 65.772 kg 69.457 kg 70.478 kg     Body mass index is 25.69 kg/m.  General:  Well nourished, well developed, in no acute distress.  Alert and orientated on room air HEENT: normal Neck: no JVD Vascular: No carotid bruits; Distal pulses 2+ bilaterally Cardiac:  normal S1, S2; irregularly irregular rhythm, tachycardic rate; 2 out of 6 systolic murmur Lungs:  clear to auscultation bilaterally, no wheezing, rhonchi or rales  Abd: soft, nontender, no hepatomegaly  Ext: 2+ bilateral lower extremity edema Musculoskeletal:  No deformities, BUE and BLE strength normal and equal Skin: warm and dry  Neuro:   no focal abnormalities noted Psych:  Normal affect   EKG:  The EKG was personally reviewed and demonstrates:  EKG showed atrial fibrillation with RVR and a rate of 122. Telemetry:  Telemetry was personally reviewed and demonstrates: Patient is currently in preop and am able to locate Telemetry.  Relevant CV  Studies: Echo pending  Laboratory Data: High Sensitivity Troponin:   Recent Labs  Lab 02/07/24 0601 02/07/24 0730  TROPONINIHS 16 17     Chemistry Recent Labs  Lab 02/07/24 0229 02/07/24 0601  NA 142 143  K 3.9 4.2  CL 106 107  CO2 23 25  GLUCOSE 203* 202*  BUN 19 18  CREATININE 0.78 0.89  CALCIUM  8.6* 8.7*  GFRNONAA >60 >60  ANIONGAP 13 11    Recent Labs  Lab 02/07/24 0229  PROT 6.2*  ALBUMIN 3.5  AST 23  ALT 22  ALKPHOS 71  BILITOT 1.1   Lipids No results for input(s): CHOL, TRIG, HDL, LABVLDL, LDLCALC, CHOLHDL in the last 168 hours.  Hematology Recent Labs  Lab 02/07/24 0229 02/07/24 0601  WBC 11.0* 8.5  RBC 4.44 4.28  HGB 13.7 13.2  HCT 42.0 40.8  MCV 94.6 95.3  MCH 30.9 30.8  MCHC 32.6 32.4  RDW 13.1 13.2  PLT 174 181   Thyroid   Recent Labs  Lab 02/07/24 0601  TSH 1.307    BNPNo results for input(s): BNP, PROBNP in the last 168 hours.  DDimer No results for input(s): DDIMER in the last 168 hours.  Radiology/Studies:  CT HIP  RIGHT WO CONTRAST Result Date: 02/07/2024 CLINICAL DATA:  Hip pain status post fall. Right femoral neck fracture. Surgical planning. EXAM: CT OF THE RIGHT HIP WITHOUT CONTRAST TECHNIQUE: Multidetector CT imaging of the right hip was performed according to the standard protocol. Multiplanar CT image reconstructions were also generated. RADIATION DOSE REDUCTION: This exam was performed according to the departmental dose-optimization program which includes automated exposure control, adjustment of the mA and/or kV according to patient size and/or use of iterative reconstruction technique. COMPARISON:  Hip radiographs 02/06/2024.  Pelvic CT 10/13/2012. FINDINGS: Bones/Joint/Cartilage Mildly impacted and mildly displaced acute fracture of the right femoral neck noted with associated mild apex anterior angulation and minimal anterior superior displacement. No dislocation or involvement of the femoral head articular  surface. There are moderate underlying right hip degenerative changes. No significant hip joint effusion. No evidence of acute fracture of the visualized right hemipelvis. There are sacroiliac degenerative changes and postsurgical changes in the lower lumbar spine from previous fusion. Ligaments Suboptimally assessed by CT. Muscles and Tendons Mild generalized atrophy. No focal intramuscular hematoma or other focal abnormality identified. Soft tissues Mild subcutaneous edema laterally without well-defined hematoma or other focal fluid collection. No evidence of foreign body or soft tissue emphysema. Iliofemoral atherosclerosis and mild prostatomegaly noted. IMPRESSION: 1. Acute mildly impacted and mildly displaced fracture of the right femoral neck as described. 2. No evidence of acute fracture of the visualized right hemipelvis. 3. Moderate underlying right hip degenerative changes. Electronically Signed   By: Elsie Perone M.D.   On: 02/07/2024 10:07   DG Chest 1 View Result Date: 02/06/2024 EXAM: 1 VIEW XRAY OF THE CHEST 02/06/2024 11:52:21 PM COMPARISON: None available. CLINICAL HISTORY: Hip pain post fall. Hip pain post fall. FINDINGS: LUNGS AND PLEURA: Low lung volumes. Resultant bronchovascular crowding at the hila. Patchy airspace opacity in the right mid lung zone peripherally. No pleural effusion. No pneumothorax. HEART AND MEDIASTINUM: Mild cardiomegaly. Aortic atherosclerosis. BONES AND SOFT TISSUES: Multilevel thoracic osteophytosis. No acute osseous abnormality. IMPRESSION: 1. Patchy airspace opacity in the right mid lung zone peripherally, possibly infectious in the acute setting . 2. Mild cardiomegaly Electronically signed by: Dorethia Molt MD 02/06/2024 11:58 PM EDT RP Workstation: [id number]   DG Hip Unilat W or Wo Pelvis 2-3 Views Right Result Date: 02/06/2024 EXAM: 2 Or 3 VIEW(S) XRAY OF THE Right Hip 02/06/2024 11:52:21 PM COMPARISON: None available. CLINICAL HISTORY: Hip pain post  fall. FINDINGS: BONES AND JOINTS: Impacted acute subcapital right femoral neck fracture noted. Femoral head is still seated within the right acetabulum. Mild bilateral degenerative hip arthritis. SOFT TISSUES: The soft tissues are unremarkable. IMPRESSION: 1. Acute impacted subcapital right femoral neck fracture. No dislocation 2. Mild bilateral degenerative hip arthritis. Electronically signed by: Dorethia Molt MD 02/06/2024 11:56 PM EDT RP Workstation: [id number]   CT Head Wo Contrast Result Date: 02/06/2024 EXAM: CT HEAD WITHOUT CONTRAST 02/06/2024 11:30:47 PM TECHNIQUE: CT of the head was performed without the administration of intravenous contrast. Automated exposure control, iterative reconstruction, and/or weight based adjustment of the mA/kV was utilized to reduce the radiation dose to as low as reasonably achievable. COMPARISON: None available. CLINICAL HISTORY: Head trauma, minor (Age >= 65y). FINDINGS: BRAIN AND VENTRICLES: Global cortical atrophy. Subcortical and periventricular small vessel ischemic changes. Intracranial atherosclerosis. No acute hemorrhage. No evidence of acute infarct. No hydrocephalus. No extra-axial collection. No mass effect or midline shift. ORBITS: No acute abnormality. SINUSES: No acute abnormality. SOFT TISSUES AND SKULL: No acute soft tissue abnormality. No skull  fracture. IMPRESSION: 1. No acute intracranial abnormality. Electronically signed by: Pinkie Pebbles MD 02/06/2024 11:31 PM EDT RP Workstation: [id number]     Assessment and Plan:  [name] is a 89 y.o. male with a hx of hypertension, hyperlipidemia, HFpEF, type 2 diabetes, aortic stenosis, and CAD with a remote history of PCI in 2004 and 2010 who is being seen 02/07/2024 for a preop evaluation at the request of Gherghe Costin.  Preoperative risk evaluation Atrial fibrillation with RVR Aortic stenosis -has murmur HFpEF CAD with a remote history of PCI in 2004 and 2010 CHA2DS2-VASc Score =    [CHF History: 1, HTN History: 1, Diabetes History: 1, Age Score: 2, Gender Score: 0].  Therefore, the patient's annual risk of stroke is   %.    Has not previously been on Eliquis . Denies having any symptoms with atrial fibrillation Has an RCRI risk score of 2 this is about a 5% chance of a major cardiac event. Is unable to do more than 4 metabolic equivalents of exertion. Patient is at an elevated cardiac risk but needs this surgery to repair his hip.  Patient and his family understand this elevated risk and are agreeable to proceed. Heart rates are elevated. His most recent echo was done on 08/2017 and showed a normal LVEF of 60 to 65%, moderate LVH, G2 DD, mild MR, mild MAC, mildly dilated left and right atrium, mild aortic regurgitation, moderate aortic valve calcification, LVOT obstruction with a mean gradient of 17, and mildly elevated pulmonary artery systolic pressures. Chest x-ray showed Patchy airspace opacity in the right mid lung zone peripherally, and Mild cardiomegaly Order echo May consider starting  metoprolol  tartrate 12.5 every 6 hours for rate control. May consider starting IV heparin  when cleared to do so by surgical team.   Right hip fracture Patient presented to the emergency department following a fall complaining of hip pain.  Imaging showed that he had a right hip fracture.  Orthopedic surgery was consulted and is planning a right hip Hemiarthroplasty today.    Risk Assessment/Risk Scores:     [place]  Heart Association (NYHA) Functional Class NYHA Class II  CHA2DS2-VASc Score =     This indicates a  % annual risk of stroke. The patient's score is based upon: CHF History: 1 HTN History: 1 Diabetes History: 1 Age Score: 2 Gender Score: 0         For questions or updates, please contact [facility] Please consult [email or web address] for contact info under     Signed, Morse Clause, PA-C  02/07/2024 12:24 PM

## 2024-02-07 NOTE — Progress Notes (Incomplete)
 Initial Nutrition Assessment  DOCUMENTATION CODES:   Non-severe (moderate) malnutrition in context of chronic illness  INTERVENTION:  Encourage po intake - currently on carb modified diet  Emphasis on protein for wound healing Room service with assist  Liberalize die to promote po intake Ensure Plus High Protein po BID, each supplement provides 350 kcal and 20 grams of protein Prosource Plus 30 ml BID, each supplement provides 100 kcal and 15 gm protein Continue MVI with minerals, Thiamine, and folic acid supplementation  50,000 international units Vitamin D orally once weekly for 8 weeks then maintenance dose of 1500-2000 IU daily for noted deficiency  Recommend updated weight  High protein High calorie handout in AVS  NUTRITION DIAGNOSIS:   Moderate Malnutrition related to chronic illness as evidenced by energy intake < or equal to 75% for > or equal to 1 month, moderate muscle depletion, mild muscle depletion, percent weight loss (14 lbs, 9% weight loss in 7 months).   GOAL:   Patient will meet greater than or equal to 90% of their needs   MONITOR:   PO intake, Supplement acceptance, Labs, Weight trends  REASON FOR ASSESSMENT:   Consult Assessment of nutrition requirement/status, Hip fracture protocol  ASSESSMENT:  89 y.o Male who lost his balance and fell, found to have right hip fx on imaging, s/p hip hemi 10/27. PMH of Afib, HFpEF, COPD, T2DM, HLD, depression, HTN, CAD, OSA, spinal stenosis.  10/26 - Admitted 10/27 - s/p Hip Hemi   Pt up in chair on visit, unfortunately bed not working and unable to get a bed weight. Recommend updated weight. Pt HOH but able to provide history.   Pt lives at home with his wife and dog. Was using a cane or walker PTA but not always. Pt reports in the last year he has had decreased appetite and weight loss going from 170 to 140 lbs. Per weight history pt has gone from 159 lbs to 145 lbs in 7 months, 14 lbs, 9% weight loss. Not  clinically significant but, is concerning given overall clinical picture. Pt has never weighed 170 lbs according to weight history, question accuracy of pt's memory. On exam pt with moderate muscle and mild fat depletions.   Pt typically cooks or gets takeout but he has noticed he used to eat everything off of his plate but now he only eats bites to maybe 25% of his meals. Reports having multiple falls in the past none of which have caused serious injury until this one. Was drinking Whiskey every day.   Pt only reports eating 2 slices of bacon this morning, no lunch. Has no appetite. Pt with increased calorie and protein needs related to healing. RD encouraged increased po intake with emphasis on protein. Pt agreeable but reports he is a picky eater and does not like the hospital food. Encouraged family to bring in outside food. RD provided Ensure for pt to try. Pt agreeable to continuing these. Will liberalize diet to promote po intake.  PT is diabetic, CBG's uncontrolled ranging from 142-258 mg/dL over the last 24 hours. DM coordinator following. Pt reports he does not take insulin  at home. Recommend continuing Ensure as pt is malnourished and needs extra calorie and protein compared to Glucerna.   Admit weight: 65.8 kg ? Stated? Current weight:  65.8 kg ? Stated?  Wt Readings from Last 10 Encounters:  02/07/24 (P) 65.8 kg  09/22/23 69.5 kg  10/28/22 70.5 kg  08/31/22 72.1 kg  06/15/22 72.2 kg  03/23/22  73.7 kg  02/23/22 73.7 kg  01/28/22 70.8 kg  08/13/21 71 kg  06/06/21 72.2 kg   Average Meal Intake: No meals recorded   Nutritionally Relevant Medications: Scheduled Meds:  feeding supplement  237 mL Oral BID BM   folic acid  1 mg Oral Daily   insulin  aspart  0-6 Units Subcutaneous Q4H   multivitamin with minerals  1 tablet Oral Daily   thiamine  100 mg Oral Daily   Or   thiamine  100 mg Intravenous Daily   Vitamin D (Ergocalciferol)  50,000 Units Oral Q7 days   Continuous  Infusions:   ceFAZolin  (ANCEF ) IV 2 g (02/08/24 0804)   Labs Reviewed: Vitamin D 5.68 CBG ranges from 142-258 mg/dL over the last 24 hours HgbA1c 6.8  NUTRITION - FOCUSED PHYSICAL EXAM:  Flowsheet Row Most Recent Value  Orbital Region Mild depletion  Upper Arm Region Severe depletion  Thoracic and Lumbar Region No depletion  Buccal Region Mild depletion  Temple Region Moderate depletion  Clavicle Bone Region Mild depletion  Clavicle and Acromion Bone Region Mild depletion  Scapular Bone Region Mild depletion  Dorsal Hand Moderate depletion  Patellar Region Moderate depletion  Anterior Thigh Region Moderate depletion  Posterior Calf Region Moderate depletion  Edema (RD Assessment) None  Hair Reviewed  Eyes Reviewed  Mouth Reviewed  Skin Reviewed  Nails Reviewed    Diet Order:   Diet Order             Diet regular Room service appropriate? Yes with Assist; Fluid consistency: Thin  Diet effective now                   EDUCATION NEEDS:   Education needs have been addressed  Skin:  Skin Assessment: Skin Integrity Issues: Skin Integrity Issues:: Incisions Incisions: Closed surgical incision right hip  Last BM:  PTA  Height:   Ht Readings from Last 1 Encounters:  02/07/24 (P) 5' 3 (1.6 m)    Weight:   Wt Readings from Last 1 Encounters:  02/07/24 (P) 65.8 kg    Ideal Body Weight:  56.4 kg  BMI:  Body mass index is 25.69 kg/m (pended).  Estimated Nutritional Needs:   Kcal:  1700-2000 kcal  Protein:  80-100 gm  Fluid:  >1.7L/day   Olivia Kenning, RD Registered Dietitian  See Amion for more information

## 2024-02-07 NOTE — Consult Note (Signed)
 I was called by the EDP for this patient.  89 year old male status post fall with right hip pain.  X-rays revealed a subcapital femoral neck fracture with varus angulation.  Will discuss with orthopedic team in the morning for surgical planning.  Patient likely will require a total versus hemiarthroplasty of the hip.  Keep NPO.  Bedrest for now.

## 2024-02-07 NOTE — Anesthesia Preprocedure Evaluation (Addendum)
 Anesthesia Evaluation  Patient identified by MRN, date of birth, ID band Patient awake    Reviewed: Allergy & Precautions, NPO status , Patient's Chart, lab work & pertinent test results  History of Anesthesia Complications Negative for: history of anesthetic complications  Airway Mallampati: II  TM Distance: >3 FB Neck ROM: Full    Dental no notable dental hx. (+) Partial Upper   Pulmonary sleep apnea , former smoker   Pulmonary exam normal breath sounds clear to auscultation       Cardiovascular hypertension, + CAD, + Cardiac Stents (S/P PCI) and +CHF (HFpEF)  Normal cardiovascular exam+ dysrhythmias  Rhythm:Regular Rate:Normal     Neuro/Psych  PSYCHIATRIC DISORDERS  Depression       GI/Hepatic negative GI ROS, Neg liver ROS,,,  Endo/Other  diabetes, Type 2    Renal/GU negative Renal ROS     Musculoskeletal  (+) Arthritis , Osteoarthritis,    Abdominal   Peds  Hematology   Anesthesia Other Findings   Reproductive/Obstetrics                              Anesthesia Physical Anesthesia Plan  ASA: 3  Anesthesia Plan: General   Post-op Pain Management:    Induction: Intravenous  PONV Risk Score and Plan: 3 and Treatment may vary due to age or medical condition, Ondansetron  and Dexamethasone   Airway Management Planned: Oral ETT  Additional Equipment: None  Intra-op Plan:   Post-operative Plan: Extubation in OR  Informed Consent: I have reviewed the patients History and Physical, chart, labs and discussed the procedure including the risks, benefits and alternatives for the proposed anesthesia with the patient or authorized representative who has indicated his/her understanding and acceptance.   Patient has DNR.  Discussed DNR with patient and Suspend DNR.   Dental advisory given  Plan Discussed with: CRNA and Surgeon  Anesthesia Plan Comments: (Patient already in afib  )          Anesthesia Quick Evaluation

## 2024-02-07 NOTE — Progress Notes (Signed)
   02/07/24 1724  Vitals  Temp 98.4 F (36.9 C)  Temp Source Oral  BP (!) 107/97  MAP (mmHg) 102  BP Location Left Arm  BP Method Automatic  Patient Position (if appropriate) Lying  Pulse Rate (!) 114  Pulse Rate Source Monitor  ECG Heart Rate (!) 110  Resp 20  Level of Consciousness  Level of Consciousness Alert  MEWS COLOR  MEWS Score Color Green  Oxygen Therapy  SpO2 91 %  O2 Device Nasal Cannula  O2 Flow Rate (L/min) 2 L/min  ECG Monitoring  QRS interval 0.08  CV Strip Heart Rate 114  Cardiac Rhythm Atrial fibrillation  MEWS Score  MEWS Temp 0  MEWS Systolic 0  MEWS Pulse 1  MEWS RR 0  MEWS LOC 0  MEWS Score 1   Pt admit to 4NP 08 from PACU. A&O x 4 with some forgetfulness. A fib on monitor. Pt denies pain at this time. Pt has bilat hearing aids in. Wife at bedside. Oriented to unit. Callbell within reach, bed alarm in place.

## 2024-02-07 NOTE — H&P (Signed)
 History and Physical    [name] FMW:[id number] DOB: [birth year] DOA: 02/06/2024  Patient coming from: Home.  Chief Complaint: Fall.  HPI: [name] is a 89 y.o. male with history of CAD status post PCI, atrial fibrillation not on anticoagulation per patient's wishes, diabetes mellitus type 2, sleep apnea not on CPAP hypertension was brought to the ER after patient had a fall at home.  Patient states he was in the kitchen when he suddenly fell after losing balance.  Did not lose consciousness.  Denies any chest pain or shortness of breath.  He is not on any anticoagulants.  ED Course: In the ER x-rays revealed right hip fracture.  Patient also was in A-fib with RVR.  Labs showed WBC of 11 hemoglobin 13.7 blood glucose 203 creatinine 0.7.  On-call orthopedic surgeon Dr. Elsa was consulted plan is to have surgery later today.  Review of Systems: As per HPI, rest all negative.   Past Medical History:  Diagnosis Date   BACK PAIN, CHRONIC 04/18/2009   BENIGN PROSTATIC HYPERTROPHY, HX OF 01/19/2007   Carpal tunnel syndrome 03/24/2007   CORONARY ARTERY DISEASE 01/19/2007   DEPRESSION, CHRONIC 06/24/2007   DIABETES MELLITUS 01/19/2007   DIABETES MELLITUS, TYPE II, WITH NEUROLOGICAL COMPLICATIONS 06/17/2010   Diarrhea 02/23/2007   HEART MURMUR, SYSTOLIC 01/02/2008   HERPES SIMPLEX INFECTION 01/19/2007   HYPERLIPIDEMIA 01/19/2007   HYPERTENSION 01/19/2007   Intermediate coronary syndrome (HCC) 08/29/2008   LEG CRAMPS, NOCTURNAL 10/28/2007   Leg cramps, sleep related 08/05/2010   OBSTRUCTIVE SLEEP APNEA 01/19/2007   does not use CPAP   OSTEOARTHRITIS 06/24/2007   Other postprocedural status(V45.89) 01/19/2007   PERCUTANEOUS TRANSLUMINAL CORONARY ANGIOPLASTY, HX OF 01/19/2007   PLANTAR FASCIITIS, RIGHT 01/19/2007   SHOULDER PAIN, LEFT 03/24/2007   SPINAL STENOSIS, LUMBAR 06/03/2009   TINEA PEDIS 11/15/2009   URI 01/11/2009    Past Surgical History:  Procedure Laterality Date   arthroscopy, knee  of hx     CARDIAC CATHETERIZATION     CATARACT EXTRACTION Left    COLONOSCOPY     inguinal herniorrhapy, hx     NASAL SINUS SURGERY     percutaneous transluminal coronary angioplasty, hx of  2004   Dr. Charlanne   plastic joint in thumb     PTCA/stent  2004   TONSILLECTOMY     VASECTOMY       reports that he quit smoking about 40 years ago. His smoking use included cigarettes. He has never used smokeless tobacco. He reports current alcohol use. He reports that he does not currently use drugs after having used the following drugs: Marijuana.  No Known Allergies  Family History  Problem Relation Age of Onset   Arthritis Sister    Diabetes Other    Coronary artery disease Other     Prior to Admission medications   Medication Sig Start Date End Date Taking? Authorizing Provider  acetaminophen  (TYLENOL ) 500 MG tablet Take 1,000 mg by mouth every 6 (six) hours as needed for mild pain (pain score 1-3) or moderate pain (pain score 4-6).   Yes [provider]  alfuzosin  (UROXATRAL ) 10 MG 24 hr tablet TAKE 1 TABLET (10 MG TOTAL) BY MOUTH DAILY WITH BREAKFAST. 03/23/23  Yes Jordan, [name], MD  Cyanocobalamin  (B-12) 1000 MCG TABS Take 1,000 mcg by mouth daily.   Yes [provider]  finasteride  (PROSCAR ) 5 MG tablet TAKE 1 TABLET BY MOUTH EVERY DAY 03/29/23  Yes Jordan, [name], MD  furosemide  (  LASIX ) 40 MG tablet TAKE 1 TABLET BY MOUTH TWICE A DAY Patient taking differently: Take 40 mg by mouth every other day. 01/06/24  Yes Jordan, [name], MD  glipiZIDE  (GLUCOTROL ) 5 MG tablet TAKE 1 TABLET BY MOUTH DAILY BEFORE BREAKFAST. 20-30 MINUTES BEFORE MEAL. 08/02/23  Yes Jordan, [name], MD  isosorbide  mononitrate (IMDUR ) 30 MG 24 hr tablet TAKE 1 TABLET BY MOUTH EVERY DAY 12/14/23  Yes Jordan, [name], MD  loperamide (IMODIUM A-D) 2 MG tablet Take 2 mg by mouth 4 (four) times daily as needed for diarrhea or loose stools.   Yes [provider]  tizanidine  (ZANAFLEX ) 2 MG  capsule TAKE 4 CAPSULES (8 MG TOTAL) BY MOUTH AT BEDTIME AS NEEDED FOR MUSCLE SPASMS. 10/08/23  Yes Jordan, [name], MD  valsartan  (DIOVAN ) 80 MG tablet TAKE 1 TABLET BY MOUTH EVERY DAY 06/21/23  Yes Jordan, [name], MD  Blood Glucose Monitoring Suppl (ACCU-CHEK AVIVA CONNECT) w/Device KIT 1 Device by Does not apply route daily. 03/01/20   Jordan, [name], MD  Blood Glucose Monitoring Suppl (ONE TOUCH ULTRA 2) w/Device KIT Daily as directed. 02/23/22   Jordan, [name], MD  citalopram  (CELEXA ) 10 MG tablet Take 1 tablet (10 mg total) by mouth daily. Patient not taking: Reported on 02/07/2024 12/16/22   Jordan, [name], MD  fluticasone  (FLONASE ) 50 MCG/ACT nasal spray PLACE 1 SPRAY INTO BOTH NOSTRILS 2 (TWO) TIMES DAILY Patient not taking: Reported on 02/07/2024 05/14/23   Jordan, [name], MD  Ipratropium-Albuterol  (COMBIVENT  RESPIMAT) 20-100 MCG/ACT AERS respimat Inhale 1 puff into the lungs every 6 (six) hours as needed for wheezing. 12/16/22   Jordan, [name], MD  Lancet Devices (LANCET DEVICE WITH EJECTOR) MISC 1 Device by Does not apply route daily. 08/15/19   Jordan, [name], MD  nitroGLYCERIN  (NITROSTAT ) 0.4 MG SL tablet Place 1 tablet (0.4 mg total) under the tongue every 5 (five) minutes as needed for chest pain. Patient not taking: Reported on 02/07/2024 08/13/20   Jordan, [name], MD  OneTouch Delica Lancets 33G MISC USE TO CHECK BLOOD SUGAR DAILY AND PRN 08/08/19   Jordan, [name], MD  [facility] ULTRA test strip USE TO CHECK BLOOD SUGAR ONCE DAILY E11.9 09/22/21   Jordan, [name], MD    Physical Exam: Constitutional: Moderately built and nourished. Vitals:   02/06/24 2304 02/06/24 2334 02/07/24 0330  BP: 126/88  112/72  Pulse: 89  87  Resp: 20  18  Temp: 98.2 F (36.8 C)  (!) 97.3 F (36.3 C)  TempSrc: Oral  Oral  SpO2: 95%  95%  Weight:  65.8 kg   Height:  5' 3 (1.6 m)    Eyes: Anicteric no pallor. ENMT: No discharge from the ears eyes nose or mouth. Neck: No mass felt.  No neck  rigidity. Respiratory: No rhonchi or crepitations. Cardiovascular: S1-S2 heard. Abdomen: Soft nontender bowel sound present. Musculoskeletal: Right hip pain on moving. Skin: No rash. Neurologic: Alert awake oriented time place and person.  Moves all extremities. Psychiatric: Appears normal.  Normal affect.   Labs on Admission: I have personally reviewed following labs and imaging studies  CBC: Recent Labs  Lab 02/07/24 0229  WBC 11.0*  NEUTROABS 9.5*  HGB 13.7  HCT 42.0  MCV 94.6  PLT 174   Basic Metabolic Panel: Recent Labs  Lab 02/07/24 0229  NA 142  K 3.9  CL 106  CO2 23  GLUCOSE 203*  BUN 19  CREATININE 0.78  CALCIUM  8.6*   GFR: Estimated Creatinine Clearance: 50.4 mL/min (by C-G formula based on SCr of 0.78 mg/dL). Liver Function Tests: Recent Labs  Lab 02/07/24 0229  AST 23  ALT 22  ALKPHOS 71  BILITOT 1.1  PROT 6.2*  ALBUMIN 3.5   No results for input(s): LIPASE, AMYLASE in the last 168 hours. No results for input(s): AMMONIA in the last 168 hours. Coagulation Profile: No results for input(s): INR, PROTIME in the last 168 hours. Cardiac Enzymes: No results for input(s): CKTOTAL, CKMB, CKMBINDEX, TROPONINI in the last 168 hours. BNP (last 3 results) No results for input(s): PROBNP in the last 8760 hours. HbA1C: No results for input(s): HGBA1C in the last 72 hours. CBG: No results for input(s): GLUCAP in the last 168 hours. Lipid Profile: No results for input(s): CHOL, HDL, LDLCALC, TRIG, CHOLHDL, LDLDIRECT in the last 72 hours. Thyroid  Function Tests: No results for input(s): TSH, T4TOTAL, FREET4, T3FREE, THYROIDAB in the last 72 hours. Anemia Panel: No results for input(s): VITAMINB12, FOLATE, FERRITIN, TIBC, IRON, RETICCTPCT in the last 72 hours. Urine analysis:    Component Value Date/Time   COLORURINE YELLOW 05/06/2013 1325   APPEARANCEUR CLOUDY (A) 05/06/2013 1325   LABSPEC  1.016 05/06/2013 1325   PHURINE 5.5 05/06/2013 1325   GLUCOSEU NEGATIVE 05/06/2013 1325   GLUCOSEU 100 08/26/2010 0754   HGBUR SMALL (A) 05/06/2013 1325   BILIRUBINUR NEGATIVE 05/06/2013 1325   KETONESUR 15 (A) 05/06/2013 1325   PROTEINUR NEGATIVE 05/06/2013 1325   UROBILINOGEN 0.2 05/06/2013 1325   NITRITE POSITIVE (A) 05/06/2013 1325   LEUKOCYTESUR LARGE (A) 05/06/2013 1325   Sepsis Labs: @LABRCNTIP (procalcitonin:4,lacticidven:4) )No results found for this or any previous visit (from the past 240 hours).   Radiological Exams on Admission: DG Chest 1 View Result Date: 02/06/2024 EXAM: 1 VIEW XRAY OF THE CHEST 02/06/2024 11:52:21 PM COMPARISON: None available. CLINICAL HISTORY: Hip pain post fall. Hip pain post fall. FINDINGS: LUNGS AND PLEURA: Low lung volumes. Resultant bronchovascular crowding at the hila. Patchy airspace opacity in the right mid lung zone peripherally. No pleural effusion. No pneumothorax. HEART AND MEDIASTINUM: Mild cardiomegaly. Aortic atherosclerosis. BONES AND SOFT TISSUES: Multilevel thoracic osteophytosis. No acute osseous abnormality. IMPRESSION: 1. Patchy airspace opacity in the right mid lung zone peripherally, possibly infectious in the acute setting . 2. Mild cardiomegaly Electronically signed by: Dorethia Molt MD 02/06/2024 11:58 PM EDT RP Workstation: [id number]   DG Hip Unilat W or Wo Pelvis 2-3 Views Right Result Date: 02/06/2024 EXAM: 2 Or 3 VIEW(S) XRAY OF THE Right Hip 02/06/2024 11:52:21 PM COMPARISON: None available. CLINICAL HISTORY: Hip pain post fall. FINDINGS: BONES AND JOINTS: Impacted acute subcapital right femoral neck fracture noted. Femoral head is still seated within the right acetabulum. Mild bilateral degenerative hip arthritis. SOFT TISSUES: The soft tissues are unremarkable. IMPRESSION: 1. Acute impacted subcapital right femoral neck fracture. No dislocation 2. Mild bilateral degenerative hip arthritis. Electronically signed by: Dorethia Molt MD 02/06/2024 11:56 PM EDT RP Workstation: [id number]   CT Head Wo Contrast Result Date: 02/06/2024 EXAM: CT HEAD WITHOUT CONTRAST 02/06/2024 11:30:47 PM TECHNIQUE: CT of the head was performed without the administration of intravenous contrast. Automated exposure control, iterative reconstruction, and/or weight based adjustment of the mA/kV was utilized to reduce the radiation dose to as low as reasonably achievable. COMPARISON: None available. CLINICAL HISTORY: Head trauma, minor (Age >= 65y). FINDINGS: BRAIN AND VENTRICLES: Global cortical atrophy. Subcortical and periventricular small vessel ischemic changes. Intracranial atherosclerosis. No acute hemorrhage. No evidence  of acute infarct. No hydrocephalus. No extra-axial collection. No mass effect or midline shift. ORBITS: No acute abnormality. SINUSES: No acute abnormality. SOFT TISSUES AND SKULL: No acute soft tissue abnormality. No skull fracture. IMPRESSION: 1. No acute intracranial abnormality. Electronically signed by: Pinkie Pebbles MD 02/06/2024 11:31 PM EDT RP Workstation: [id number]    EKG: Independently reviewed.  A-fib with RVR.  Assessment/Plan Principal Problem:   Hip fracture (HCC) Active Problems:   Type 2 diabetes mellitus with diabetic neuropathy, unspecified (HCC)   OBSTRUCTIVE SLEEP APNEA   PERCUTANEOUS TRANSLUMINAL CORONARY ANGIOPLASTY, HX OF   BPH with urinary obstruction   (HFpEF) heart failure with preserved ejection fraction (HCC)   Atrial fibrillation with RVR (HCC)    Right hip fracture after mechanical fall patient will be kept n.p.o. in anticipation of procedure.  Further recommendations per orthopedics. A-fib with RVR patient has known history of A-fib.  Not on any anticoagulation per patient's wishes.  Patient is also not on any rate limiting medications.  I have placed patient on metoprolol  25 mg p.o.  If patient's heart rate is still elevated will consider starting patient on Cardizem infusion.   Last 2D echo in 2019 showed EF of 60 to 65%.  Will check TSH troponin.  Consult cardiology. CAD status post PCI denies any chest pain. Hypertension on ARB and Imdur . BPH on finasteride . Sleep apnea patient does not use CPAP. History of HFpEF appears compensated.  Not on diuretics. Diabetes mellitus type 2 takes glipizide  off-and-on.  Will check hemoglobin A1c keep patient on sliding scale coverage for now. Alcohol use patient states he drinks whiskey every other day.  Will keep patient on CIWA.  Since patient has right hip fracture with A-fib with RVR will need further management and more than 2 midnight stay.   DVT prophylaxis: SCDs. Code Status: DNR as confirmed with patient. Family Communication: Discussed with patient. Disposition Plan: Monitored bed. Consults called: Orthopedics and cardiology. Admission status: Inpatient.

## 2024-02-07 NOTE — Interval H&P Note (Signed)
 History and Physical Interval Note:  02/07/2024 2:05 PM  [name]  has presented today for surgery, with the diagnosis of RIGHT HIP FRACTURE.  The various methods of treatment have been discussed with the patient and family. After consideration of risks, benefits and other options for treatment, the patient has consented to  Procedure(s): HEMIARTHROPLASTY, HIP, DIRECT ANTERIOR APPROACH, FOR FRACTURE (Right) as a surgical intervention.  The patient's history has been reviewed, patient examined, no change in status, stable for surgery.  I have reviewed the patient's chart and labs.  Questions were answered to the patient's satisfaction.     Zael Shuman P Remedy Corporan

## 2024-02-07 NOTE — H&P (View-Only) (Signed)
 Reason for Consult:Right hip fx Referring Physician: Nilda Fendt Time called: 0740 Time at bedside: 0902   [name] is an 89 y.o. male.  HPI: [name] lost his balance and fell. He had immediate right hip pain. His family managed to get hip up but his pain was enough that he was brought to the ED. Workup showed a right hip fx and orthopedic surgery was consulted. He lives at home with family and usually, but not always, uses a cane or RW to ambulate. He is fairly sedentary.  Past Medical History:  Diagnosis Date   BACK PAIN, CHRONIC 04/18/2009   BENIGN PROSTATIC HYPERTROPHY, HX OF 01/19/2007   Carpal tunnel syndrome 03/24/2007   CORONARY ARTERY DISEASE 01/19/2007   DEPRESSION, CHRONIC 06/24/2007   DIABETES MELLITUS 01/19/2007   DIABETES MELLITUS, TYPE II, WITH NEUROLOGICAL COMPLICATIONS 06/17/2010   Diarrhea 02/23/2007   HEART MURMUR, SYSTOLIC 01/02/2008   HERPES SIMPLEX INFECTION 01/19/2007   HYPERLIPIDEMIA 01/19/2007   HYPERTENSION 01/19/2007   Intermediate coronary syndrome (HCC) 08/29/2008   LEG CRAMPS, NOCTURNAL 10/28/2007   Leg cramps, sleep related 08/05/2010   OBSTRUCTIVE SLEEP APNEA 01/19/2007   does not use CPAP   OSTEOARTHRITIS 06/24/2007   Other postprocedural status(V45.89) 01/19/2007   PERCUTANEOUS TRANSLUMINAL CORONARY ANGIOPLASTY, HX OF 01/19/2007   PLANTAR FASCIITIS, RIGHT 01/19/2007   SHOULDER PAIN, LEFT 03/24/2007   SPINAL STENOSIS, LUMBAR 06/03/2009   TINEA PEDIS 11/15/2009   URI 01/11/2009    Past Surgical History:  Procedure Laterality Date   arthroscopy, knee of hx     CARDIAC CATHETERIZATION     CATARACT EXTRACTION Left    COLONOSCOPY     inguinal herniorrhapy, hx     NASAL SINUS SURGERY     percutaneous transluminal coronary angioplasty, hx of  2004   Dr. Charlanne   plastic joint in thumb     PTCA/stent  2004   TONSILLECTOMY     VASECTOMY      Family History  Problem Relation Age of Onset   Arthritis Sister    Diabetes Other    Coronary artery disease  Other     Social History:  reports that he quit smoking about 40 years ago. His smoking use included cigarettes. He has never used smokeless tobacco. He reports current alcohol use. He reports that he does not currently use drugs after having used the following drugs: Marijuana.  Allergies: No Known Allergies  Medications: I have reviewed the patient's current medications.  Results for orders placed or performed during the hospital encounter of 02/06/24 (from the past 48 hours)  CBC with Differential     Status: Abnormal   Collection Time: 02/07/24  2:29 AM  Result Value Ref Range   WBC 11.0 (H) 4.0 - 10.5 K/uL   RBC 4.44 4.22 - 5.81 MIL/uL   Hemoglobin 13.7 13.0 - 17.0 g/dL   HCT 57.9 60.9 - 47.9 %   MCV 94.6 80.0 - 100.0 fL   MCH 30.9 26.0 - 34.0 pg   MCHC 32.6 30.0 - 36.0 g/dL   RDW 86.8 88.4 - 84.4 %   Platelets 174 150 - 400 K/uL   nRBC 0.0 0.0 - 0.2 %   Neutrophils Relative % 85 %   Neutro Abs 9.5 (H) 1.7 - 7.7 K/uL   Lymphocytes Relative 7 %   Lymphs Abs 0.8 0.7 - 4.0 K/uL   Monocytes Relative 6 %   Monocytes Absolute 0.7 0.1 - 1.0 K/uL   Eosinophils Relative 0 %  Eosinophils Absolute 0.0 0.0 - 0.5 K/uL   Basophils Relative 1 %   Basophils Absolute 0.1 0.0 - 0.1 K/uL   Immature Granulocytes 1 %   Abs Immature Granulocytes 0.05 0.00 - 0.07 K/uL    Comment: Performed at [facility], [address], [place], [place] [address]  Comprehensive metabolic panel     Status: Abnormal   Collection Time: 02/07/24  2:29 AM  Result Value Ref Range   Sodium 142 135 - 145 mmol/L   Potassium 3.9 3.5 - 5.1 mmol/L   Chloride 106 98 - 111 mmol/L   CO2 23 22 - 32 mmol/L   Glucose, Bld 203 (H) 70 - 99 mg/dL    Comment: Glucose reference range applies only to samples taken after fasting for at least 8 hours.   BUN 19 8 - 23 mg/dL   Creatinine, Ser 9.21 0.61 - 1.24 mg/dL   Calcium  8.6 (L) 8.9 - 10.3 mg/dL   Total Protein 6.2 (L) 6.5 - 8.1 g/dL   Albumin 3.5 3.5 - 5.0 g/dL    AST 23 15 - 41 U/L   ALT 22 0 - 44 U/L   Alkaline Phosphatase 71 38 - 126 U/L   Total Bilirubin 1.1 0.0 - 1.2 mg/dL   GFR, Estimated >39 >39 mL/min    Comment: (NOTE) Calculated using the CKD-EPI Creatinine Equation (2021)    Anion gap 13 5 - 15    Comment: Performed at [facility], [address] [place], [place] [address]  CBG monitoring, ED     Status: Abnormal   Collection Time: 02/07/24  5:33 AM  Result Value Ref Range   Glucose-Capillary 178 (H) 70 - 99 mg/dL    Comment: Glucose reference range applies only to samples taken after fasting for at least 8 hours.  CBC     Status: None   Collection Time: 02/07/24  6:01 AM  Result Value Ref Range   WBC 8.5 4.0 - 10.5 K/uL   RBC 4.28 4.22 - 5.81 MIL/uL   Hemoglobin 13.2 13.0 - 17.0 g/dL   HCT 59.1 60.9 - 47.9 %   MCV 95.3 80.0 - 100.0 fL   MCH 30.8 26.0 - 34.0 pg   MCHC 32.4 30.0 - 36.0 g/dL   RDW 86.7 88.4 - 84.4 %   Platelets 181 150 - 400 K/uL   nRBC 0.0 0.0 - 0.2 %    Comment: Performed at [facility], [address] [place], [place] [address]  Basic metabolic panel     Status: Abnormal   Collection Time: 02/07/24  6:01 AM  Result Value Ref Range   Sodium 143 135 - 145 mmol/L   Potassium 4.2 3.5 - 5.1 mmol/L   Chloride 107 98 - 111 mmol/L   CO2 25 22 - 32 mmol/L   Glucose, Bld 202 (H) 70 - 99 mg/dL    Comment: Glucose reference range applies only to samples taken after fasting for at least 8 hours.   BUN 18 8 - 23 mg/dL   Creatinine, Ser 9.10 0.61 - 1.24 mg/dL   Calcium  8.7 (L) 8.9 - 10.3 mg/dL   GFR, Estimated >39 >39 mL/min    Comment: (NOTE) Calculated using the CKD-EPI Creatinine Equation (2021)    Anion gap 11 5 - 15    Comment: Performed at [facility], [address] [place], [place] [address]  TSH     Status: None   Collection Time: 02/07/24  6:01 AM  Result Value Ref Range   TSH 1.307 0.350 - 4.500 uIU/mL    Comment: Performed by a 3rd Generation assay with a functional  sensitivity of <=0.01 uIU/mL. Performed at [facility], [address], [place], [place] [address]   Troponin I (High Sensitivity)     Status: None   Collection Time: 02/07/24  6:01 AM  Result Value Ref Range   Troponin I (High Sensitivity) 16 <18 ng/L    Comment: (NOTE) Elevated high sensitivity troponin I (hsTnI) values and significant  changes across serial measurements may suggest ACS but many other  chronic and acute conditions are known to elevate hsTnI results.  Refer to the Links section for chest pain algorithms and additional  guidance. Performed at [facility], [address], [place], [place] [address]   Hemoglobin A1c     Status: Abnormal   Collection Time: 02/07/24  6:01 AM  Result Value Ref Range   Hgb A1c MFr Bld 6.8 (H) 4.8 - 5.6 %    Comment: (NOTE) Diagnosis of Diabetes The following HbA1c ranges recommended by the American Diabetes Association (ADA) may be used as an aid in the diagnosis of diabetes mellitus.  Hemoglobin             Suggested A1C NGSP%              Diagnosis  <5.7                   Non Diabetic  5.7-6.4                Pre-Diabetic  >6.4                   Diabetic  <7.0                   Glycemic control for                       adults with diabetes.     Mean Plasma Glucose 148.46 mg/dL    Comment: Performed at [facility], [address] [place], [place] [address]  Troponin I (High Sensitivity)     Status: None   Collection Time: 02/07/24  7:30 AM  Result Value Ref Range   Troponin I (High Sensitivity) 17 <18 ng/L    Comment: (NOTE) Elevated high sensitivity troponin I (hsTnI) values and significant  changes across serial measurements may suggest ACS but many other  chronic and acute conditions are known to elevate hsTnI results.  Refer to the Links section for chest pain algorithms and additional  guidance. Performed at [facility], [address], [place], [place] [address]   CBG monitoring,  ED     Status: Abnormal   Collection Time: 02/07/24  7:52 AM  Result Value Ref Range   Glucose-Capillary 205 (H) 70 - 99 mg/dL    Comment: Glucose reference range applies only to samples taken after fasting for at least 8 hours.    DG Chest 1 View Result Date: 02/06/2024 EXAM: 1 VIEW XRAY OF THE CHEST 02/06/2024 11:52:21 PM COMPARISON: None available. CLINICAL HISTORY: Hip pain post fall. Hip pain post fall. FINDINGS: LUNGS AND PLEURA: Low lung volumes. Resultant bronchovascular crowding at the hila. Patchy airspace opacity in the right mid lung zone peripherally. No pleural effusion. No pneumothorax. HEART AND MEDIASTINUM: Mild cardiomegaly. Aortic atherosclerosis. BONES AND SOFT TISSUES: Multilevel thoracic osteophytosis. No  acute osseous abnormality. IMPRESSION: 1. Patchy airspace opacity in the right mid lung zone peripherally, possibly infectious in the acute setting . 2. Mild cardiomegaly Electronically signed by: Dorethia Molt MD 02/06/2024 11:58 PM EDT RP Workstation: [id number]   DG Hip Unilat W or Wo Pelvis 2-3 Views Right Result Date: 02/06/2024 EXAM: 2 Or 3 VIEW(S) XRAY OF THE Right Hip 02/06/2024 11:52:21 PM COMPARISON: None available. CLINICAL HISTORY: Hip pain post fall. FINDINGS: BONES AND JOINTS: Impacted acute subcapital right femoral neck fracture noted. Femoral head is still seated within the right acetabulum. Mild bilateral degenerative hip arthritis. SOFT TISSUES: The soft tissues are unremarkable. IMPRESSION: 1. Acute impacted subcapital right femoral neck fracture. No dislocation 2. Mild bilateral degenerative hip arthritis. Electronically signed by: Dorethia Molt MD 02/06/2024 11:56 PM EDT RP Workstation: [id number]   CT Head Wo Contrast Result Date: 02/06/2024 EXAM: CT HEAD WITHOUT CONTRAST 02/06/2024 11:30:47 PM TECHNIQUE: CT of the head was performed without the administration of intravenous contrast. Automated exposure control, iterative reconstruction, and/or weight  based adjustment of the mA/kV was utilized to reduce the radiation dose to as low as reasonably achievable. COMPARISON: None available. CLINICAL HISTORY: Head trauma, minor (Age >= 65y). FINDINGS: BRAIN AND VENTRICLES: Global cortical atrophy. Subcortical and periventricular small vessel ischemic changes. Intracranial atherosclerosis. No acute hemorrhage. No evidence of acute infarct. No hydrocephalus. No extra-axial collection. No mass effect or midline shift. ORBITS: No acute abnormality. SINUSES: No acute abnormality. SOFT TISSUES AND SKULL: No acute soft tissue abnormality. No skull fracture. IMPRESSION: 1. No acute intracranial abnormality. Electronically signed by: Pinkie Pebbles MD 02/06/2024 11:31 PM EDT RP Workstation: [id number]    Review of Systems  HENT:  Negative for ear discharge, ear pain, hearing loss and tinnitus.   Eyes:  Negative for photophobia and pain.  Respiratory:  Negative for cough and shortness of breath.   Cardiovascular:  Negative for chest pain.  Gastrointestinal:  Negative for abdominal pain, nausea and vomiting.  Genitourinary:  Negative for dysuria, flank pain, frequency and urgency.  Musculoskeletal:  Positive for arthralgias (Right hip). Negative for back pain, myalgias and neck pain.  Neurological:  Negative for dizziness and headaches.  Hematological:  Does not bruise/bleed easily.  Psychiatric/Behavioral:  The patient is not nervous/anxious.    Blood pressure 111/85, pulse (!) 50, temperature 98.3 F (36.8 C), temperature source Oral, resp. rate 20, height 5' 3 (1.6 m), weight 65.8 kg, SpO2 95%. Physical Exam Constitutional:      General: He is not in acute distress.    Appearance: He is well-developed. He is not diaphoretic.  HENT:     Head: Normocephalic and atraumatic.  Eyes:     General: No scleral icterus.       Right eye: No discharge.        Left eye: No discharge.     Conjunctiva/sclera: Conjunctivae normal.  Cardiovascular:     Rate and  Rhythm: Normal rate and regular rhythm.  Pulmonary:     Effort: Pulmonary effort is normal. No respiratory distress.  Musculoskeletal:     Cervical back: Normal range of motion.     Comments: RLE No traumatic wounds, ecchymosis, or rash  Mod TTP hip  No knee or ankle effusion  Knee stable to varus/ valgus and anterior/posterior stress  Sens DPN, SPN, TN intact  Motor EHL, ext, flex, evers 5/5  DP 2+, PT 2+, No significant edema  Skin:    General: Skin is warm and dry.  Neurological:     Mental  Status: He is alert.  Psychiatric:        Mood and Affect: Mood normal.        Behavior: Behavior normal.     Assessment/Plan: Right hip fx -- Plan hip hemi today with Dr. Kendal. Please keep NPO.    Ozell DOROTHA Ned, PA-C Orthopedic Surgery [phone] 02/07/2024, 9:12 AM

## 2024-02-07 NOTE — ED Notes (Signed)
 Report called to short stay nurse.

## 2024-02-07 NOTE — Progress Notes (Signed)
 PROGRESS NOTE  [name] FMW:[id number] DOB: June 21, 1931 DOA: 02/06/2024 PCP: Jordan, [name], MD   LOS: 0 days   Brief Narrative / Interim history: 89 year old male with history of CAD status post PCI, A-fib not on anticoagulation per his wishes, DM2, OSA not on CPAP, HTN came to the hospital after a fall at home, found to have right hip fracture.  He was also in A-fib with RVR.  Orthopedic surgery was consulted and he was admitted to the hospital  Subjective / 24h Interval events: He is doing well this morning, denies any chest pain, denies any shortness of breath.  He reports some pain in his hip  Assesement and Plan: Principal problem Right hip fracture-orthopedic surgery consulted, n.p.o., possible operative repair today  Active problems A-fib with RVR-has known history of A-fib, not on anticoagulation per his wishes.  Started on metoprolol , rates are better controlled.  Cardiology consulted by the night team  CAD-history of PCI, no chest pain and otherwise prior to the fall he was feeling at baseline  Essential hypertension-continue home medications  BPH-continue finasteride   Chronic diastolic CHF-appears compensated, not on diuretics  DM2-on glipizide  at home, continue sliding scale  OSA-not using CPAP  EtOH use-drinks whiskey every other day.  Keep on CIWA  Scheduled Meds:  finasteride   5 mg Oral Daily   folic acid  1 mg Oral Daily   insulin  aspart  0-6 Units Subcutaneous Q4H   irbesartan   75 mg Oral Daily   isosorbide  mononitrate  30 mg Oral Daily   multivitamin with minerals  1 tablet Oral Daily   thiamine  100 mg Oral Daily   Or   thiamine  100 mg Intravenous Daily   triamcinolone  acetonide  40 mg Intramuscular Once   Continuous Infusions: PRN Meds:.HYDROcodone -acetaminophen , morphine  injection  Current Outpatient Medications  Medication Instructions   acetaminophen  (TYLENOL ) 1,000 mg, Oral, Every 6 hours PRN   alfuzosin  (UROXATRAL ) 10 mg, Oral, Daily  with breakfast   B-12 1,000 mcg, Daily   Blood Glucose Monitoring Suppl (ACCU-CHEK AVIVA CONNECT) w/Device KIT 1 Device, Does not apply, Daily   Blood Glucose Monitoring Suppl (ONE TOUCH ULTRA 2) w/Device KIT Daily as directed.   citalopram  (CELEXA ) 10 mg, Oral, Daily   finasteride  (PROSCAR ) 5 mg, Oral, Daily   fluticasone  (FLONASE ) 50 MCG/ACT nasal spray 1 spray, Each Nare, 2 times daily   furosemide  (LASIX ) 40 mg, Oral, 2 times daily   glipiZIDE  (GLUCOTROL ) 5 MG tablet TAKE 1 TABLET BY MOUTH DAILY BEFORE BREAKFAST. 20-30 MINUTES BEFORE MEAL.   Ipratropium-Albuterol  (COMBIVENT  RESPIMAT) 20-100 MCG/ACT AERS respimat 1 puff, Inhalation, Every 6 hours PRN   isosorbide  mononitrate (IMDUR ) 30 mg, Oral, Daily   Lancet Devices (LANCET DEVICE WITH EJECTOR) MISC 1 Device, Does not apply, Daily   loperamide (IMODIUM A-D) 2 mg, 4 times daily PRN   nitroGLYCERIN  (NITROSTAT ) 0.4 mg, Sublingual, Every 5 min PRN   OneTouch Delica Lancets 33G MISC USE TO CHECK BLOOD SUGAR DAILY AND PRN   ONETOUCH ULTRA test strip USE TO CHECK BLOOD SUGAR ONCE DAILY E11.9   tizanidine  (ZANAFLEX ) 8 mg, Oral, At bedtime PRN   valsartan  (DIOVAN ) 80 mg, Oral, Daily    Diet Orders (From admission, onward)     Start     Ordered   02/07/24 0508  Diet NPO time specified Except for: Sips with Meds  Diet effective now       Question:  Except for  Answer:  Sips with Meds   02/07/24  0507            DVT prophylaxis: SCDs Start: 02/07/24 0506   Lab Results  Component Value Date   PLT 181 02/07/2024      Code Status: Limited: Do not attempt resuscitation (DNR) -DNR-LIMITED -Do Not Intubate/DNI   Family Communication: No family at bedside  Status is: Inpatient Remains inpatient appropriate because: Pending operative intervention   Level of care: Progressive  Consultants:  Orthopedic surgery  Objective: Vitals:   02/06/24 2334 02/07/24 0330 02/07/24 0736 02/07/24 0815  BP:  112/72  111/85  Pulse:  87  (!) 50   Resp:  18  20  Temp:  (!) 97.3 F (36.3 C) 98.3 F (36.8 C)   TempSrc:  Oral Oral   SpO2:  95%  95%  Weight: 65.8 kg     Height: 5' 3 (1.6 m)      No intake or output data in the 24 hours ending 02/07/24 0850 Wt Readings from Last 3 Encounters:  02/06/24 65.8 kg  09/22/23 69.5 kg  10/28/22 70.5 kg    Examination:  Constitutional: NAD Eyes: no scleral icterus ENMT: Mucous membranes are moist.  Neck: normal, supple Respiratory: clear to auscultation bilaterally, no wheezing, no crackles.  Cardiovascular: Irregular.  Abdomen: non distended, no tenderness. Bowel sounds positive.  Musculoskeletal: no clubbing / cyanosis.   Data Reviewed: I have independently reviewed following labs and imaging studies   CBC Recent Labs  Lab 02/07/24 0229 02/07/24 0601  WBC 11.0* 8.5  HGB 13.7 13.2  HCT 42.0 40.8  PLT 174 181  MCV 94.6 95.3  MCH 30.9 30.8  MCHC 32.6 32.4  RDW 13.1 13.2  LYMPHSABS 0.8  --   MONOABS 0.7  --   EOSABS 0.0  --   BASOSABS 0.1  --     Recent Labs  Lab 02/07/24 0229 02/07/24 0601  NA 142 143  K 3.9 4.2  CL 106 107  CO2 23 25  GLUCOSE 203* 202*  BUN 19 18  CREATININE 0.78 0.89  CALCIUM  8.6* 8.7*  AST 23  --   ALT 22  --   ALKPHOS 71  --   BILITOT 1.1  --   ALBUMIN 3.5  --   TSH  --  1.307  HGBA1C  --  6.8*    ------------------------------------------------------------------------------------------------------------------ No results for input(s): CHOL, HDL, LDLCALC, TRIG, CHOLHDL, LDLDIRECT in the last 72 hours.  Lab Results  Component Value Date   HGBA1C 6.8 (H) 02/07/2024   ------------------------------------------------------------------------------------------------------------------ Recent Labs    02/07/24 0601  TSH 1.307    Cardiac Enzymes No results for input(s): CKMB, TROPONINI, MYOGLOBIN in the last 168 hours.  Invalid input(s):  CK ------------------------------------------------------------------------------------------------------------------    Component Value Date/Time   BNP 124 (H) 01/15/2017 1555    CBG: Recent Labs  Lab 02/07/24 0533 02/07/24 0752  GLUCAP 178* 205*    No results found for this or any previous visit (from the past 240 hours).   Radiology Studies: DG Chest 1 View Result Date: 02/06/2024 EXAM: 1 VIEW XRAY OF THE CHEST 02/06/2024 11:52:21 PM COMPARISON: None available. CLINICAL HISTORY: Hip pain post fall. Hip pain post fall. FINDINGS: LUNGS AND PLEURA: Low lung volumes. Resultant bronchovascular crowding at the hila. Patchy airspace opacity in the right mid lung zone peripherally. No pleural effusion. No pneumothorax. HEART AND MEDIASTINUM: Mild cardiomegaly. Aortic atherosclerosis. BONES AND SOFT TISSUES: Multilevel thoracic osteophytosis. No acute osseous abnormality. IMPRESSION: 1. Patchy airspace opacity in the right mid lung  zone peripherally, possibly infectious in the acute setting . 2. Mild cardiomegaly Electronically signed by: Dorethia Molt MD 02/06/2024 11:58 PM EDT RP Workstation: [id number]   DG Hip Unilat W or Wo Pelvis 2-3 Views Right Result Date: 02/06/2024 EXAM: 2 Or 3 VIEW(S) XRAY OF THE Right Hip 02/06/2024 11:52:21 PM COMPARISON: None available. CLINICAL HISTORY: Hip pain post fall. FINDINGS: BONES AND JOINTS: Impacted acute subcapital right femoral neck fracture noted. Femoral head is still seated within the right acetabulum. Mild bilateral degenerative hip arthritis. SOFT TISSUES: The soft tissues are unremarkable. IMPRESSION: 1. Acute impacted subcapital right femoral neck fracture. No dislocation 2. Mild bilateral degenerative hip arthritis. Electronically signed by: Dorethia Molt MD 02/06/2024 11:56 PM EDT RP Workstation: [id number]   CT Head Wo Contrast Result Date: 02/06/2024 EXAM: CT HEAD WITHOUT CONTRAST 02/06/2024 11:30:47 PM TECHNIQUE: CT of the head was  performed without the administration of intravenous contrast. Automated exposure control, iterative reconstruction, and/or weight based adjustment of the mA/kV was utilized to reduce the radiation dose to as low as reasonably achievable. COMPARISON: None available. CLINICAL HISTORY: Head trauma, minor (Age >= 65y). FINDINGS: BRAIN AND VENTRICLES: Global cortical atrophy. Subcortical and periventricular small vessel ischemic changes. Intracranial atherosclerosis. No acute hemorrhage. No evidence of acute infarct. No hydrocephalus. No extra-axial collection. No mass effect or midline shift. ORBITS: No acute abnormality. SINUSES: No acute abnormality. SOFT TISSUES AND SKULL: No acute soft tissue abnormality. No skull fracture. IMPRESSION: 1. No acute intracranial abnormality. Electronically signed by: Pinkie Pebbles MD 02/06/2024 11:31 PM EDT RP Workstation: [id number]     Nilda Fendt, MD, PhD [facility]  Between 7 am - 7 pm I am available, please contact me via Amion (for emergencies) or Securechat (non urgent messages)  Between 7 pm - 7 am I am not available, please contact night coverage MD/APP via Amion

## 2024-02-08 ENCOUNTER — Other Ambulatory Visit (HOSPITAL_COMMUNITY): Payer: Self-pay

## 2024-02-08 ENCOUNTER — Inpatient Hospital Stay (HOSPITAL_COMMUNITY)

## 2024-02-08 ENCOUNTER — Encounter (HOSPITAL_COMMUNITY): Payer: Self-pay | Admitting: Student

## 2024-02-08 ENCOUNTER — Telehealth (HOSPITAL_COMMUNITY): Payer: Self-pay | Admitting: Pharmacy Technician

## 2024-02-08 DIAGNOSIS — S72001D Fracture of unspecified part of neck of right femur, subsequent encounter for closed fracture with routine healing: Secondary | ICD-10-CM

## 2024-02-08 DIAGNOSIS — I10 Essential (primary) hypertension: Secondary | ICD-10-CM | POA: Diagnosis not present

## 2024-02-08 DIAGNOSIS — I4891 Unspecified atrial fibrillation: Secondary | ICD-10-CM | POA: Diagnosis not present

## 2024-02-08 DIAGNOSIS — I2510 Atherosclerotic heart disease of native coronary artery without angina pectoris: Secondary | ICD-10-CM | POA: Diagnosis not present

## 2024-02-08 DIAGNOSIS — I350 Nonrheumatic aortic (valve) stenosis: Secondary | ICD-10-CM | POA: Diagnosis not present

## 2024-02-08 LAB — BASIC METABOLIC PANEL WITH GFR
Anion gap: 10 (ref 5–15)
BUN: 13 mg/dL (ref 8–23)
CO2: 23 mmol/L (ref 22–32)
Calcium: 8.4 mg/dL — ABNORMAL LOW (ref 8.9–10.3)
Chloride: 106 mmol/L (ref 98–111)
Creatinine, Ser: 0.82 mg/dL (ref 0.61–1.24)
GFR, Estimated: >60 mL/min (ref >60)
Glucose, Bld: 258 mg/dL — ABNORMAL HIGH (ref 70–99)
Potassium: 3.8 mmol/L (ref 3.5–5.1)
Sodium: 139 mmol/L (ref 135–145)

## 2024-02-08 LAB — GLUCOSE, CAPILLARY
Glucose-Capillary: 224 mg/dL — ABNORMAL HIGH (ref 70–99)
Glucose-Capillary: 258 mg/dL — ABNORMAL HIGH (ref 70–99)
Glucose-Capillary: 236 mg/dL — ABNORMAL HIGH (ref 70–99)
Glucose-Capillary: 234 mg/dL — ABNORMAL HIGH (ref 70–99)
Glucose-Capillary: 263 mg/dL — ABNORMAL HIGH (ref 70–99)
Glucose-Capillary: 172 mg/dL — ABNORMAL HIGH (ref 70–99)

## 2024-02-08 LAB — VITAMIN D 25 HYDROXY (VIT D DEFICIENCY, FRACTURES): Vit D, 25-Hydroxy: 5.68 ng/mL — ABNORMAL LOW (ref 30–100)

## 2024-02-08 LAB — CBC
HCT: 38.5 % — ABNORMAL LOW (ref 39.0–52.0)
Hemoglobin: 12.6 g/dL — ABNORMAL LOW (ref 13.0–17.0)
MCH: 31.1 pg (ref 26.0–34.0)
MCHC: 32.7 g/dL (ref 30.0–36.0)
MCV: 95.1 fL (ref 80.0–100.0)
Platelets: 157 K/uL (ref 150–400)
RBC: 4.05 MIL/uL — ABNORMAL LOW (ref 4.22–5.81)
RDW: 13.1 % (ref 11.5–15.5)
WBC: 13.7 K/uL — ABNORMAL HIGH (ref 4.0–10.5)
nRBC: 0 % (ref 0.0–0.2)

## 2024-02-08 MED ORDER — MENTHOL 3 MG MT LOZG
1.0000 | LOZENGE | OROMUCOSAL | Status: DC | PRN
  Filled 2024-02-08: qty 9

## 2024-02-08 MED ORDER — TRANEXAMIC ACID-NACL 1000-0.7 MG/100ML-% IV SOLN
1000.0000 mg | Freq: Once | INTRAVENOUS | Status: AC
  Administered 2024-02-08: 1000 mg via INTRAVENOUS
  Filled 2024-02-08: qty 100

## 2024-02-08 MED ORDER — ENSURE PLUS HIGH PROTEIN PO LIQD
237.0000 mL | Freq: Two times a day (BID) | ORAL | Status: DC
  Administered 2024-02-08 – 2024-02-16 (×12): 237 mL via ORAL

## 2024-02-08 MED ORDER — ACETAMINOPHEN 325 MG PO TABS: 325.0000 mg | ORAL_TABLET | Freq: Four times a day (QID) | ORAL | Status: DC | PRN

## 2024-02-08 MED ORDER — MORPHINE SULFATE (PF) 2 MG/ML IV SOLN: 0.5000 mg | INTRAVENOUS | Status: DC | PRN

## 2024-02-08 MED ORDER — ZOLPIDEM TARTRATE 5 MG PO TABS
5.0000 mg | ORAL_TABLET | Freq: Every evening | ORAL | Status: DC | PRN
Start: 2024-02-08 — End: 2024-02-17
  Administered 2024-02-08 – 2024-02-11 (×4): 5 mg via ORAL
  Filled 2024-02-08 (×4): qty 1

## 2024-02-08 MED ORDER — APIXABAN 2.5 MG PO TABS
2.5000 mg | ORAL_TABLET | Freq: Two times a day (BID) | ORAL | Status: DC
  Administered 2024-02-08: 2.5 mg via ORAL
  Filled 2024-02-08: qty 1

## 2024-02-08 MED ORDER — APIXABAN 5 MG PO TABS
5.0000 mg | ORAL_TABLET | Freq: Two times a day (BID) | ORAL | Status: DC
  Administered 2024-02-08 – 2024-02-17 (×16): 5 mg via ORAL
  Filled 2024-02-08 (×17): qty 1

## 2024-02-08 MED ORDER — DIPHENHYDRAMINE HCL 12.5 MG/5ML PO ELIX: 12.5000 mg | ORAL_SOLUTION | ORAL | Status: DC | PRN

## 2024-02-08 MED ORDER — MAGNESIUM CITRATE PO SOLN
1.0000 | Freq: Once | ORAL | Status: AC | PRN
  Administered 2024-02-13: 1 via ORAL
  Filled 2024-02-08: qty 296

## 2024-02-08 MED ORDER — METOCLOPRAMIDE HCL 5 MG/ML IJ SOLN: 5.0000 mg | Freq: Three times a day (TID) | INTRAMUSCULAR | Status: DC | PRN

## 2024-02-08 MED ORDER — METOPROLOL TARTRATE 25 MG PO TABS
25.0000 mg | ORAL_TABLET | Freq: Four times a day (QID) | ORAL | Status: DC
  Administered 2024-02-08 – 2024-02-10 (×7): 25 mg via ORAL
  Filled 2024-02-08 (×7): qty 1

## 2024-02-08 MED ORDER — METHOCARBAMOL 500 MG PO TABS
500.0000 mg | ORAL_TABLET | Freq: Four times a day (QID) | ORAL | Status: DC | PRN
  Administered 2024-02-08 – 2024-02-17 (×4): 500 mg via ORAL
  Filled 2024-02-08 (×4): qty 1

## 2024-02-08 MED ORDER — METOCLOPRAMIDE HCL 5 MG PO TABS: 5.0000 mg | ORAL_TABLET | Freq: Three times a day (TID) | ORAL | Status: DC | PRN

## 2024-02-08 MED ORDER — METOPROLOL TARTRATE 5 MG/5ML IV SOLN
2.5000 mg | INTRAVENOUS | Status: DC | PRN
  Administered 2024-02-08: 2.5 mg via INTRAVENOUS

## 2024-02-08 MED ORDER — ATORVASTATIN CALCIUM 40 MG PO TABS
40.0000 mg | ORAL_TABLET | Freq: Every day | ORAL | Status: DC
  Administered 2024-02-08 – 2024-02-17 (×8): 40 mg via ORAL
  Filled 2024-02-08 (×8): qty 1

## 2024-02-08 MED ORDER — PHENOL 1.4 % MT LIQD
1.0000 | OROMUCOSAL | Status: DC | PRN
Start: 2024-02-08 — End: 2024-02-17
  Administered 2024-02-09: 1 via OROMUCOSAL
  Filled 2024-02-08: qty 177

## 2024-02-08 MED ORDER — VITAMIN D (ERGOCALCIFEROL) 1.25 MG (50000 UNIT) PO CAPS
50000.0000 [IU] | ORAL_CAPSULE | ORAL | Status: DC
  Administered 2024-02-08 – 2024-02-15 (×2): 50000 [IU] via ORAL
  Filled 2024-02-08 (×2): qty 1

## 2024-02-08 MED ORDER — CEFAZOLIN SODIUM-DEXTROSE 2-4 GM/100ML-% IV SOLN
2.0000 g | Freq: Four times a day (QID) | INTRAVENOUS | Status: AC
  Administered 2024-02-08 (×2): 2 g via INTRAVENOUS
  Filled 2024-02-08 (×2): qty 100

## 2024-02-08 MED ORDER — METOPROLOL TARTRATE 5 MG/5ML IV SOLN
INTRAVENOUS | Status: AC
  Filled 2024-02-08: qty 5

## 2024-02-08 MED ORDER — HYDROCODONE-ACETAMINOPHEN 5-325 MG PO TABS
1.0000 | ORAL_TABLET | ORAL | Status: DC | PRN
  Administered 2024-02-08 – 2024-02-17 (×5): 2 via ORAL
  Filled 2024-02-08 (×5): qty 2

## 2024-02-08 MED ORDER — BISACODYL 5 MG PO TBEC
5.0000 mg | DELAYED_RELEASE_TABLET | Freq: Every day | ORAL | Status: DC | PRN
  Administered 2024-02-09: 5 mg via ORAL
  Filled 2024-02-08: qty 1

## 2024-02-08 MED ORDER — PROSOURCE PLUS PO LIQD
30.0000 mL | Freq: Two times a day (BID) | ORAL | Status: DC
  Administered 2024-02-08 – 2024-02-17 (×14): 30 mL via ORAL
  Filled 2024-02-08 (×11): qty 30

## 2024-02-08 MED ORDER — CELECOXIB 200 MG PO CAPS
200.0000 mg | ORAL_CAPSULE | Freq: Two times a day (BID) | ORAL | Status: DC
  Administered 2024-02-08 – 2024-02-17 (×17): 200 mg via ORAL
  Filled 2024-02-08 (×20): qty 1

## 2024-02-08 MED ORDER — DOCUSATE SODIUM 100 MG PO CAPS
100.0000 mg | ORAL_CAPSULE | Freq: Two times a day (BID) | ORAL | Status: DC
  Administered 2024-02-08 – 2024-02-10 (×4): 100 mg via ORAL
  Filled 2024-02-08 (×4): qty 1

## 2024-02-08 MED ORDER — SENNOSIDES-DOCUSATE SODIUM 8.6-50 MG PO TABS
1.0000 | ORAL_TABLET | Freq: Every evening | ORAL | Status: DC | PRN
Start: 2024-02-08 — End: 2024-02-10

## 2024-02-08 MED ORDER — ONDANSETRON HCL 4 MG/2ML IJ SOLN: 4.0000 mg | Freq: Four times a day (QID) | INTRAMUSCULAR | Status: DC | PRN

## 2024-02-08 MED ORDER — HYDROCODONE-ACETAMINOPHEN 7.5-325 MG PO TABS
1.0000 | ORAL_TABLET | ORAL | Status: DC | PRN
  Administered 2024-02-13 – 2024-02-16 (×3): 1 via ORAL
  Filled 2024-02-08 (×3): qty 1

## 2024-02-08 MED ORDER — METHOCARBAMOL 1000 MG/10ML IJ SOLN: 500.0000 mg | Freq: Four times a day (QID) | INTRAMUSCULAR | Status: DC | PRN

## 2024-02-08 MED ORDER — ONDANSETRON HCL 4 MG PO TABS: 4.0000 mg | ORAL_TABLET | Freq: Four times a day (QID) | ORAL | Status: DC | PRN

## 2024-02-08 NOTE — TOC Initial Note (Signed)
 Transition of Care (TOC) - Initial/Assessment Note  Rayfield Gobble RN,BSN Inpatient Care Management Unit 4NP (Non Trauma)- RN Case Manager See Treatment Team for direct Phone #   Patient Details  Name: [name] MRN: [id number] Date of Birth: 03/12/1932  Transition of Care [facility]) CM/SW Contact:    Gobble Rayfield Hurst, RN Phone Number: 02/08/2024, 2:44 PM  Clinical Narrative:                 CM spoke with pt at bedside to discuss transition needs HH/DME.   Pt lives at home with spouse, voiced that he had DME at home- RW, etc. Stated he was not sure he would need BSC- and wanted to wait and see how he does before deciding.   List provided for [facility] choice- pt voiced he was agreeable and has used HH in past. Will discuss with wife and CM will follow up in am for agency of choice.   Address, phone # and PCP all confirmed in epic.  Family will transport home.    Pt will need orders placed for HHPT/OT, DME- BSC.   Inpatient Care management will continue to follow   Expected Discharge Plan: Home w Home Health Services Barriers to Discharge: Continued Medical Work up   Patient Goals and CMS Choice Patient states their goals for this hospitalization and ongoing recovery are:: return home and recover CMS Medicare.gov Compare Post Acute Care list provided to:: Patient Choice offered to / list presented to : Patient      Expected Discharge Plan and Services   Discharge Planning Services: CM Consult Post Acute Care Choice: Durable Medical Equipment, Home Health Living arrangements for the past 2 months: Single Family Home                 DME Arranged: Bedside commode         HH Arranged: PT, OT          Prior Living Arrangements/Services Living arrangements for the past 2 months: Single Family Home Lives with:: Spouse Patient language and need for interpreter reviewed:: Yes Do you feel safe going back to the place where you live?: Yes      Need for Family  Participation in Patient Care: Yes (Comment) Care giver support system in place?: Yes (comment) Current home services: DME (Rolling walker, rollator, shower chair) Criminal Activity/Legal Involvement Pertinent to Current Situation/Hospitalization: No - Comment as needed  Activities of Daily Living      Permission Sought/Granted Permission sought to share information with : Facility Industrial/product Designer granted to share information with : Yes, Verbal Permission Granted     Permission granted to share info w AGENCY: HH        Emotional Assessment Appearance:: Appears stated age Attitude/Demeanor/Rapport: Engaged Affect (typically observed): Accepting, Calm, Pleasant Orientation: : Oriented to Self, Oriented to Place, Oriented to  Time, Oriented to Situation Alcohol / Substance Use: Not Applicable Psych Involvement: No (comment)  Admission diagnosis:  Hip fracture (HCC) [S72.009A] Displaced fracture of right femoral neck (HCC) [S72.001A] Patient Active Problem List   Diagnosis Date Noted   Hip fracture (HCC) 02/07/2024   Atrial fibrillation with RVR (HCC) 02/07/2024   Nonrheumatic aortic valve stenosis 02/07/2024   Atrial fibrillation (HCC) 08/31/2022   Urine incontinence 03/29/2018   Bilateral lower extremity edema 03/29/2018   Chronic otitis externa of both ears 03/29/2018   Forgetfulness 12/17/2017   Peripheral neuropathy 10/30/2016   Chronic fatigue 10/30/2016   Knee osteoarthritis 07/26/2016  Chronic diarrhea 10/16/2015   Murmur 08/29/2015   Sinus bradycardia 08/29/2015   Myalgia due to statin 08/29/2015   Cramp of both lower extremities 08/29/2015   Insomnia 08/12/2015   (HFpEF) heart failure with preserved ejection fraction (HCC) 07/22/2015   COPD (chronic obstructive pulmonary disease) (HCC) 07/22/2015   Spondylolisthesis of lumbar region 05/02/2013   Cervical spine disease 04/16/2013   Preop cardiovascular exam 03/02/2012   BPH with urinary  obstruction 03/22/2011   Diabetes mellitus type 2 with neurological manifestations (HCC) 06/17/2010   SPINAL STENOSIS, LUMBAR 06/03/2009   BACK PAIN, CHRONIC 04/18/2009   HEART MURMUR, SYSTOLIC 01/02/2008   Cramp of limb 10/28/2007   Depression, major, recurrent, moderate (HCC) 06/24/2007   Generalized osteoarthritis of multiple sites 06/24/2007   CARPAL TUNNEL SYNDROME 03/24/2007   HERPES SIMPLEX INFECTION 01/19/2007   Type 2 diabetes mellitus with diabetic neuropathy, unspecified (HCC) 01/19/2007   Hyperlipidemia, mixed 01/19/2007   OBSTRUCTIVE SLEEP APNEA 01/19/2007   Hypertension with heart disease 01/19/2007   Coronary artery disease involving native coronary artery of native heart without angina pectoris 01/19/2007   BPH associated with nocturia 01/19/2007   PERCUTANEOUS TRANSLUMINAL CORONARY ANGIOPLASTY, HX OF 01/19/2007   PCP:  Jordan, [name], MD Pharmacy:   [facility] [phone] - [place], [place] - [address] [place] [address] [place] [address] [place] [address] Phone: [phone] Fax: [phone]     Social Drivers of Health (SDOH) Social History: SDOH Screenings   Food Insecurity: No Food Insecurity (02/07/2024)  Housing: Low Risk  (02/07/2024)  Transportation Needs: No Transportation Needs (02/07/2024)  Utilities: Not At Risk (02/07/2024)  Alcohol Screen: Low Risk  (02/16/2022)  Depression (PHQ2-9): High Risk (02/02/2023)  Financial Resource Strain: Low Risk  (02/16/2022)  Physical Activity: Insufficiently Active (02/16/2022)  Social Connections: Unknown (02/07/2024)  Stress: Stress Concern Present (02/16/2022)  Tobacco Use: Medium Risk (02/07/2024)   SDOH Interventions:     Readmission Risk Interventions     No data to display

## 2024-02-08 NOTE — TOC CAGE-AID Note (Signed)
 Transition of Care [facility]) - CAGE-AID Screening   Patient Details  Name: [name] MRN: [id number] Date of Birth: 15-Aug-1931  Transition of Care [facility]) CM/SW Contact:    Carmeron Heady E Calloway Andrus, LCSW Phone Number: 02/08/2024, 9:18 AM   Clinical Narrative: Patient states he drinks a few shots of whiskey about once every 3 days. Patient denies other substance use. Patient denies resource needs, states he is not a heavy drinker.    CAGE-AID Screening:    Have You Ever Felt You Ought to Cut Down on Your Drinking or Drug Use?: No Have People Annoyed You By Critizing Your Drinking Or Drug Use?: No Have You Felt Bad Or Guilty About Your Drinking Or Drug Use?: No Have You Ever Had a Drink or Used Drugs First Thing In The Morning to Steady Your Nerves or to Get Rid of a Hangover?: No CAGE-AID Score: 0  Substance Abuse Education Offered: Yes

## 2024-02-08 NOTE — Plan of Care (Signed)

## 2024-02-08 NOTE — Progress Notes (Signed)
 Orthopaedic Trauma Progress Note  SUBJECTIVE: Patient doing okay this morning.  Pain controlled at rest.  Unfortunately postoperative orders were not released so he did not receive any of the pain medication last night.  Has not been out of bed yet since surgery.  Denies any numbness or tingling throughout the right lower extremity.  No chest pain. No SOB. No nausea/vomiting. No other complaints.  Tolerating diet and fluids but is not had much of an appetite.  Wife at bedside.  OBJECTIVE:  Vitals:   02/08/24 0300 02/08/24 0800  BP: (!) 122/99 119/83  Pulse: (!) 128 (!) 116  Resp: (!) 24 20  Temp: 99 F (37.2 C)   SpO2: 91% 92%    Opiates Today (MME): Today's  total administered Morphine  Milligram Equivalents: 15 Opiates Yesterday (MME): Yesterday's total administered Morphine  Milligram Equivalents: 42  General: Sitting up in bed, no acute distress.  Pleasant and cooperative Respiratory: No increased work of breathing.  Operative Extremity (RLE): Aquacel dressing in place is clean, dry, intact.  Some bruising and swelling around the hip as expected.  Some soreness to the thigh.  Otherwise nontender to the lower leg, ankle, foot.  Ankle dorsiflexion/plantarflexion intact.  Able to wiggle toes.  No significant calf tenderness.  Endorses sensation light touch over all aspects of the foot. + DP pulse  IMAGING: Postop imaging order not released.  Hip x-rays ordered for this morning  LABS:  Results for orders placed or performed during the hospital encounter of 02/06/24 (from the past 24 hours)  Glucose, capillary     Status: Abnormal   Collection Time: 02/07/24 12:40 PM  Result Value Ref Range   Glucose-Capillary 204 (H) 70 - 99 mg/dL  Type and screen [facility]     Status: None (Preliminary result)   Collection Time: 02/07/24  1:05 PM  Result Value Ref Range   ABO/RH(D) O POS    Antibody Screen POS    Sample Expiration 02/10/2024,2359    Antibody Identification ANTI E     Unit Number [id number]    Blood Component Type RED CELLS,LR    Unit division 00    Status of Unit ALLOCATED    Donor AG Type NEGATIVE FOR E ANTIGEN    Transfusion Status OK TO TRANSFUSE    Crossmatch Result COMPATIBLE    Unit Number [id number]    Blood Component Type RED CELLS,LR    Unit division 00    Status of Unit ALLOCATED    Donor AG Type NEGATIVE FOR E ANTIGEN    Transfusion Status OK TO TRANSFUSE    Crossmatch Result COMPATIBLE    Unit Number [id number]    Blood Component Type RED CELLS,LR    Unit division 00    Status of Unit ALLOCATED    Donor AG Type NEGATIVE FOR E ANTIGEN    Transfusion Status OK TO TRANSFUSE    Crossmatch Result COMPATIBLE   Glucose, capillary     Status: Abnormal   Collection Time: 02/07/24  4:40 PM  Result Value Ref Range   Glucose-Capillary 142 (H) 70 - 99 mg/dL  Glucose, capillary     Status: Abnormal   Collection Time: 02/07/24  7:39 PM  Result Value Ref Range   Glucose-Capillary 225 (H) 70 - 99 mg/dL  Glucose, capillary     Status: Abnormal   Collection Time: 02/07/24 10:56 PM  Result Value Ref Range   Glucose-Capillary 165 (H) 70 - 99 mg/dL  Glucose, capillary  Status: Abnormal   Collection Time: 02/08/24  3:19 AM  Result Value Ref Range   Glucose-Capillary 224 (H) 70 - 99 mg/dL  Glucose, capillary     Status: Abnormal   Collection Time: 02/08/24  8:15 AM  Result Value Ref Range   Glucose-Capillary 258 (H) 70 - 99 mg/dL  Basic metabolic panel     Status: Abnormal   Collection Time: 02/08/24  8:45 AM  Result Value Ref Range   Sodium 139 135 - 145 mmol/L   Potassium 3.8 3.5 - 5.1 mmol/L   Chloride 106 98 - 111 mmol/L   CO2 23 22 - 32 mmol/L   Glucose, Bld 258 (H) 70 - 99 mg/dL   BUN 13 8 - 23 mg/dL   Creatinine, Ser 9.17 0.61 - 1.24 mg/dL   Calcium  8.4 (L) 8.9 - 10.3 mg/dL   GFR, Estimated >39 >39 mL/min   Anion gap 10 5 - 15    ASSESSMENT: [name] is a 89 y.o. male, 1 Day Post-Op s/p fall Procedures:  LEFT HIP HEMIARTHROPLASTY FOR FRACTURE, ANTERIOR APPROACH  CV/Blood loss: Hemoglobin 13.2 preoperatively.  CBC pending this a.m.  Hemodynamically stable  PLAN: Weightbearing: WBAT RLE ROM: Unrestricted ROM.  No hip precautions needed Incisional and dressing care: Dressings left intact until follow-up  Showering: Okay to shower, Aquacel dressing may get wet Orthopedic device(s): None  Pain management:  1. Tylenol  325-650 mg q 6 hours PRN 2. Robaxin  500 mg q 6 hours PRN 3. Norco 5-325 mg or 7.5-325 mg q 4 hours PRN 4. Morphine  0.5-1 mg q 2 hours PRN 5. Celebrex 200 mg BID  VTE prophylaxis: Eliquis , SCDs ID:  Ancef  2gm post op Foley/Lines:  No foley, KVO IVFs Impediments to Fracture Healing: Diabetes.  Vitamin D level pending, will start supplementation as indicated. Dispo: PT/OT evaluation today, dispo pending.    D/C recommendations: - Norco, Robaxin , Tylenol  for pain control - Eliquis  twice daily x 30 days for DVT prophylaxis - Possible need for Vit D supplementation  Follow - up plan: 2 weeks after d/c for wound check and repeat x-rays   Contact information:  Franky Light MD, Lauraine Moores PA-C. After hours and holidays please check [email or web address] for group call information for [facility]   Lauraine PATRIC Moores, PA-C [phone] (office) [email or web address]

## 2024-02-08 NOTE — Telephone Encounter (Signed)
 Patient Product/process development scientist completed.    The patient is insured through [employer]. Patient has Medicare and is not eligible for a copay card, but may be able to apply for patient assistance or Medicare RX Payment Plan (Patient Must reach out to their plan, if eligible for payment plan), if available.    Ran test claim for Eliquis 5 mg and the current 30 day co-pay is $152.71.   This test claim was processed through [facility]- copay amounts may vary at other pharmacies due to pharmacy/plan contracts, or as the patient moves through the different stages of their insurance plan.     Reyes Sharps, CPHT Pharmacy Technician Patient Advocate Specialist [facility] Patient Advocate Team Direct Number: [phone]  Fax: [phone]

## 2024-02-08 NOTE — Inpatient Diabetes Management (Signed)
 Inpatient Diabetes Program Recommendations  AACE/ADA: New Consensus Statement on Inpatient Glycemic Control (2015)  Target Ranges:  Prepandial:   less than 140 mg/dL      Peak postprandial:   less than 180 mg/dL (1-2 hours)      Critically ill patients:  140 - 180 mg/dL   Lab Results  Component Value Date   GLUCAP 236 (H) 02/08/2024   HGBA1C 6.8 (H) 02/07/2024    Review of Glycemic Control  Latest Reference Range & Units 02/07/24 12:40 02/07/24 16:40 02/07/24 19:39 02/07/24 22:56 02/08/24 03:19 02/08/24 08:15 02/08/24 11:29  Glucose-Capillary 70 - 99 mg/dL 795 (H) 857 (H) 774 (H) 165 (H) 224 (H) 258 (H) 236 (H)   Diabetes history: DM 2 Outpatient Diabetes medications: Glipizide  5 mg Daily Current orders for Inpatient glycemic control:  Novolog  0-6 units Q4  Inpatient Diabetes Program Recommendations:    -   Increase Novolog  correction to 0-9 units + hs  Thanks,  Clotilda Bull RN, MSN, BC-ADM Inpatient Diabetes Coordinator Team Pager [phone] (8a-5p)

## 2024-02-08 NOTE — Discharge Instructions (Signed)
 Franky Light, MD Lauraine Moores PA-C [facility] [address] [place], [place] [address] [phone] (tel)   [phone] (fax)   TOTAL HIP REPLACEMENT POSTOPERATIVE DIRECTIONS    Hip Rehabilitation, Guidelines Following Surgery   WEIGHT BEARING Weight bearing as tolerated with assist device (walker, cane, etc) as directed, use it as long as suggested by your surgeon or therapist, typically at least 4-6 weeks.  The results of a hip operation are greatly improved after range of motion and muscle strengthening exercises. Follow all safety measures which are given to protect your hip. If any of these exercises cause increased pain or swelling in your joint, decrease the amount until you are comfortable again. Then slowly increase the exercises. Call your caregiver if you have problems or questions.   HOME CARE INSTRUCTIONS  Most of the following instructions are designed to prevent the dislocation of your new hip.  Remove items at home which could result in a fall. This includes throw rugs or furniture in walking pathways.  Continue medications as instructed at time of discharge. You may have some home medications which will be placed on hold until you complete the course of blood thinner medication. You may start showering once you are discharged home. Do not remove your dressing. Do not put on socks or shoes without following the instructions of your caregivers.   Sit on chairs with arms. Use the chair arms to help push yourself up when arising.  Arrange for the use of a toilet seat elevator so you are not sitting low.  Walk with walker as instructed.  You may resume a sexual relationship in one month or when given the OK by your caregiver.  Use walker as long as suggested by your caregivers.  You may put full weight on your legs and walk as much as is comfortable. Avoid periods of inactivity such as sitting longer than an hour when not asleep. This helps  prevent blood clots.  You may return to work once you are cleared by designer, industrial/product.  Do not drive a car for 6 weeks or until released by your surgeon.  Do not drive while taking narcotics.  Wear elastic stockings for two weeks following surgery during the day but you may remove then at night.  Make sure you keep all of your appointments after your operation with all of your doctors and caregivers. You should call the office at the above phone number and make an appointment for approximately two weeks after the date of your surgery. Please pick up a stool softener and laxative for home use as long as you are requiring pain medications. ICE to the affected hip every three hours for 30 minutes at a time and then as needed for pain and swelling. Continue to use ice on the hip for pain and swelling from surgery. You may notice swelling that will progress down to the foot and ankle.  This is normal after surgery.  Elevate the leg when you are not up walking on it.   It is important for you to complete the blood thinner medication as prescribed by your doctor. Continue to use the breathing machine which will help keep your temperature down.  It is common for your temperature to cycle up and down following surgery, especially at night when you are not up moving around and exerting yourself.  The breathing machine keeps your lungs expanded and your temperature down.  RANGE OF MOTION AND STRENGTHENING EXERCISES  These exercises  are designed to help you keep full movement of your hip joint. Follow your caregiver's or physical therapist's instructions. Perform all exercises about fifteen times, three times per day or as directed. Exercise both hips, even if you have had only one joint replacement. These exercises can be done on a training (exercise) mat, on the floor, on a table or on a bed. Use whatever works the best and is most comfortable for you. Use music or television while you are exercising so that the  exercises are a pleasant break in your day. This will make your life better with the exercises acting as a break in routine you can look forward to.  Lying on your back, slowly slide your foot toward your buttocks, raising your knee up off the floor. Then slowly slide your foot back down until your leg is straight again.  Lying on your back spread your legs as far apart as you can without causing discomfort.  Lying on your side, raise your upper leg and foot straight up from the floor as far as is comfortable. Slowly lower the leg and repeat.  Lying on your back, tighten up the muscle in the front of your thigh (quadriceps muscles). You can do this by keeping your leg straight and trying to raise your heel off the floor. This helps strengthen the largest muscle supporting your knee.  Lying on your back, tighten up the muscles of your buttocks both with the legs straight and with the knee bent at a comfortable angle while keeping your heel on the floor.   SKILLED REHAB INSTRUCTIONS: If the patient is transferred to a skilled rehab facility following release from the hospital, a list of the current medications will be sent to the facility for the patient to continue.  When discharged from the skilled rehab facility, please have the facility set up the patient's Home Health Physical Therapy prior to being released. Also, the skilled facility will be responsible for providing the patient with their medications at time of release from the facility to include their pain medication and their blood thinner medication. If the patient is still at the rehab facility at time of the two week follow up appointment, the skilled rehab facility will also need to assist the patient in arranging follow up appointment in our office and any transportation needs.  POST-OPERATIVE OPIOID TAPER INSTRUCTIONS: It is important to wean off of your opioid medication as soon as possible. If you do not need pain medication after your  surgery it is ok to stop day one. Opioids include: Codeine, Hydrocodone (Norco, Vicodin), Oxycodone (Percocet, oxycontin ) and hydromorphone  amongst others.  Long term and even short term use of opiods can cause: Increased pain response Dependence Constipation Depression Respiratory depression And more.  Withdrawal symptoms can include Flu like symptoms Nausea, vomiting And more Techniques to manage these symptoms Hydrate well Eat regular healthy meals Stay active Use relaxation techniques(deep breathing, meditating, yoga) Do Not substitute Alcohol to help with tapering If you have been on opioids for less than two weeks and do not have pain than it is ok to stop all together.  Plan to wean off of opioids This plan should start within one week post op of your joint replacement. Maintain the same interval or time between taking each dose and first decrease the dose.  Cut the total daily intake of opioids by one tablet each day Next start to increase the time between doses. The last dose that should be eliminated is the evening  dose.    MAKE SURE YOU:  Understand these instructions.  Will watch your condition.  Will get help right away if you are not doing well or get worse.  Pick up stool softner and laxative for home use following surgery while on pain medications. Do not remove your dressing. The dressing is waterproof--it is OK to take showers. Continue to use ice for pain and swelling after surgery. Do not use any lotions or creams on the incision until instructed by your surgeon. Total Hip Protocol.   High-Protein and High-Calorie Diet Eating high-protein and high-calorie foods can help you to gain weight, heal after an injury, and get better after an illness or surgery. The amount of daily protein and calories you need depends on: Your body weight. The reason you were told to follow this diet. Usually, a high-protein, high-calorie diet means that you should: Eat  250-500 extra calories each day. Make sure that you get enough of your daily calories from protein. Ask your health care provider how many of your calories should come from protein and how many calories total you need each day. Follow the diet as told by your provider. What are tips for following this plan? Reading food labels Check the nutrition facts label for calories, and grams of fat and protein. Items with more than 4 grams of protein are high-protein foods. General information  Ask your provider if you should take a nutritional supplement. Try to eat six small meals each day instead of three large meals. A goal is usually to eat every 2 to 3 hours. Eat a balanced diet. In each meal, include one food that's high in protein and one food with fat in it. Keep nutritious snacks available, such as nuts, trail mixes, dried fruit, and whole-milk yogurt. If you have kidney disease or diabetes, talk with your provider about how much protein is safe for you. Too much protein may put extra stress on your kidneys. Replace zero-calorie drinks with drinks that have calories in them, such as milk and 100% fruit juice. Consider setting a timer to remind you to eat. You'll want to eat even if you do not feel very hungry. Preparing meals Milk and dairy foods. Add whole milk, half-and-half, or heavy cream to cereal, pudding, soup, or hot cocoa. Add whole milk to instant breakfast drinks. Add powdered milk to baked goods, smoothies, or milkshakes. Add powdered milk, cream, or butter to mashed potatoes. Replace water with milk or heavy cream when making foods such as oatmeal, pudding, or cocoa. Make cream-based pastas and soups. Add cheese to cooked vegetables. Make whole-milk yogurt parfaits. Top them with granola, fruit, or nuts. Add cottage cheese to fruit. Add cream cheese to sandwiches or as a topping on crackers and bread. Eggs. Add hard-boiled eggs to salads. Keep hard-boiled eggs in the fridge  to snack on. Add cheese to cooked eggs. Beans, nuts, and seeds. Add peanut butter to oatmeal or smoothies. Use peanut butter as a dip for fruits and vegetables or as a topping for pretzels, celery, or crackers. Add beans to casseroles, dips, and spreads. Add pureed beans to sauces and soups. Salads, soups, and other foods. Add avocado, cheese, or both to sandwiches or salads. Add avocado to smoothies. Add meat, poultry, or seafood to rice, pasta, casseroles, salads, and soups. Use mayonnaise when making egg salad, chicken salad, or tuna salad. Add oil or butter to cooked vegetables and grains. What high-protein foods should I eat?  Vegetables Soybeans. Peas. Grains Quinoa.  Bulgur wheat. Buckwheat. Meats and other proteins Beef, pork, and poultry. Fish and seafood. Eggs. Tofu. Textured vegetable protein (TVP). Peanut butter. Nuts and seeds. Dried beans. Protein powders. Hummus. Jerky. Dairy Whole milk. Whole-milk yogurt. Powdered milk. Cheese. Cottage cheese. Eggnog. Beverages High-protein supplement drinks. Soy milk. Other foods Protein bars. The items listed above may not be all the foods and drinks you can have. Talk with an expert in healthy eating called a dietitian to learn more. What high-calorie foods should I eat? Fruits Dried fruit. Fruit leather. Canned fruit in syrup. Fruit juice. Avocado. Vegetables Vegetables cooked in oil or butter. Fried potatoes. Grains Pasta. Quick breads. Muffins. Pancakes. Granola. Meats and other proteins Peanut butter and other nut butters. Nuts and seeds. Dairy Heavy cream. Whipped cream. Cream cheese. Sour cream. Ice cream. Custard. Pudding. Whole-milk dairy products. Beverages Meal-replacement beverages. Nutrition shakes. Fruit juice. Seasonings and condiments Salad dressing. Mayonnaise. Alfredo sauce. Fruit preserves or jelly. Honey. Syrup. Sweets and desserts Cake. Cookies. Pie. Pastries. Candy bars. Chocolate. Fats and  oils Butter or margarine. Oil. Gravy. Other foods Meal-replacement bars. The items listed above may not be all the foods and drinks you can have. Talk with an expert in healthy eating to learn more. This information is not intended to replace advice given to you by your health care provider. Make sure you discuss any questions you have with your health care provider. Document Revised: 08/24/2022 Document Reviewed: 08/24/2022 Elsevier Patient Education  2024 Elsevier Inc.  ______________________________________________________________________________________________  Information on my medicine - ELIQUIS  (apixaban )  This medication education was reviewed with me or my healthcare representative as part of my discharge preparation.    Why was Eliquis  prescribed for you? Eliquis  was prescribed for you to reduce the risk of a blood clot forming that can cause a stroke if you have a medical condition called atrial fibrillation (a type of irregular heartbeat).  What do You need to know about Eliquis  ? Take your Eliquis  TWICE DAILY - one tablet in the morning and one tablet in the evening with or without food. If you have difficulty swallowing the tablet whole please discuss with your pharmacist how to take the medication safely.  Take Eliquis  exactly as prescribed by your doctor and DO NOT stop taking Eliquis  without talking to the doctor who prescribed the medication.  Stopping may increase your risk of developing a stroke.  Refill your prescription before you run out.  After discharge, you should have regular check-up appointments with your healthcare provider that is prescribing your Eliquis .  In the future your dose may need to be changed if your kidney function or weight changes by a significant amount or as you get older.  What do you do if you miss a dose? If you miss a dose, take it as soon as you remember on the same day and resume taking twice daily.  Do not take more than one  dose of ELIQUIS  at the same time to make up a missed dose.  Important Safety Information A possible side effect of Eliquis  is bleeding. You should call your healthcare provider right away if you experience any of the following: Bleeding from an injury or your nose that does not stop. Unusual colored urine (red or dark brown) or unusual colored stools (red or black). Unusual bruising for unknown reasons. A serious fall or if you hit your head (even if there is no bleeding).  Some medicines may interact with Eliquis  and might increase your risk of bleeding or  clotting while on Eliquis . To help avoid this, consult your healthcare provider or pharmacist prior to using any new prescription or non-prescription medications, including herbals, vitamins, non-steroidal anti-inflammatory drugs (NSAIDs) and supplements.  This website has more information on Eliquis  (apixaban ): http://[email or web address] .com/eliquis dena

## 2024-02-08 NOTE — Evaluation (Signed)
 Physical Therapy Evaluation Patient Details Name: [name] MRN: [id number] DOB: 1931-05-18 Today's Date: 02/08/2024  History of Present Illness  89 y.o. male presents to [facility] hospital on 02/06/2024 after a fall. Pt found to have R hip fx, underwent R hip hemiarthroplasty on 10/27. PMH includes CAD, afib, DMII, OSA, HTN.  Clinical Impression  Pt presents to PT with deficits in functional mobility, gait, balance, strength, power, endurance. Pt is able to transfer and ambulate for short household distances with support of RW. Pt will benefit from frequent mobilization in an effort to improve strength, balance and endurance. PT will follow to progress ambulation and to initiate stair training. PT recommends discharge home with HHPT and a BSC pending continued progression of mobility in the inpatient setting.        If plan is discharge home, recommend the following: A little help with walking and/or transfers;Assistance with cooking/housework;A lot of help with bathing/dressing/bathroom;Assist for transportation;Help with stairs or ramp for entrance   Can travel by private vehicle        Equipment Recommendations BSC/3in1  Recommendations for Other Services       Functional Status Assessment Patient has had a recent decline in their functional status and demonstrates the ability to make significant improvements in function in a reasonable and predictable amount of time.     Precautions / Restrictions Precautions Precautions: Fall Recall of Precautions/Restrictions: Intact Precaution/Restrictions Comments: no hip precautions Restrictions Weight Bearing Restrictions Per Provider Order: Yes RLE Weight Bearing Per Provider Order: Weight bearing as tolerated      Mobility  Bed Mobility Overal bed mobility: Needs Assistance Bed Mobility: Rolling, Sidelying to Sit Rolling: Min assist Sidelying to sit: Min assist            Transfers Overall transfer level: Needs  assistance Equipment used: Rolling walker (2 wheels) Transfers: Sit to/from Stand, Bed to chair/wheelchair/BSC Sit to Stand: Contact guard assist   Step pivot transfers: Contact guard assist            Ambulation/Gait Ambulation/Gait assistance: Contact guard assist Gait Distance (Feet): 40 Feet Assistive device: Rolling walker (2 wheels) Gait Pattern/deviations: Step-to pattern Gait velocity: reduced Gait velocity interpretation: <1.31 ft/sec, indicative of household ambulator   General Gait Details: slowed step-to gait, reduced foot clearance of RLE  Stairs            Wheelchair Mobility     Tilt Bed    Modified Rankin (Stroke Patients Only)       Balance Overall balance assessment: Needs assistance Sitting-balance support: No upper extremity supported, Feet supported Sitting balance-Leahy Scale: Good     Standing balance support: Bilateral upper extremity supported, Reliant on assistive device for balance Standing balance-Leahy Scale: Poor                               Pertinent Vitals/Pain Pain Assessment Pain Assessment: Faces Faces Pain Scale: Hurts even more Pain Location: R hip Pain Descriptors / Indicators: Sore Pain Intervention(s): Monitored during session    Home Living Family/patient expects to be discharged to:: Private residence Living Arrangements: Spouse/significant other Available Help at Discharge: Family;Available 24 hours/day Type of Home: House Home Access: Stairs to enter Entrance Stairs-Rails: Left Entrance Stairs-Number of Steps: 4   Home Layout: Able to live on main level with bedroom/bathroom;Multi-level Home Equipment: Rolling Walker (2 wheels);Rollator (4 wheels);Cane - single point;Grab bars - tub/shower      Prior Function Prior  Level of Function : Independent/Modified Independent             Mobility Comments: ambulatory most often with SPC, utilizes RW or rollator PRN       Extremity/Trunk  Assessment   Upper Extremity Assessment Upper Extremity Assessment: Generalized weakness    Lower Extremity Assessment Lower Extremity Assessment: Generalized weakness    Cervical / Trunk Assessment Cervical / Trunk Assessment: Kyphotic  Communication   Communication Communication: Impaired Factors Affecting Communication: Hearing impaired (wears hearing aides)    Cognition Arousal: Alert Behavior During Therapy: WFL for tasks assessed/performed   PT - Cognitive impairments: No apparent impairments                         Following commands: Intact       Cueing Cueing Techniques: Verbal cues     General Comments General comments (skin integrity, edema, etc.): VSS on 3L [place]    Exercises     Assessment/Plan    PT Assessment Patient needs continued PT services  PT Problem List Decreased strength;Decreased activity tolerance;Decreased balance;Decreased mobility;Decreased knowledge of use of DME;Decreased safety awareness;Decreased knowledge of precautions;Pain       PT Treatment Interventions DME instruction;Gait training;Stair training;Functional mobility training;Therapeutic activities;Therapeutic exercise;Balance training;Neuromuscular re-education;Patient/family education    PT Goals (Current goals can be found in the Care Plan section)  Acute Rehab PT Goals Patient Stated Goal: to return to prior level of function PT Goal Formulation: With patient Time For Goal Achievement: 02/22/24 Potential to Achieve Goals: Good    Frequency Min 3X/week     Co-evaluation               AM-PAC PT 6 Clicks Mobility  Outcome Measure Help needed turning from your back to your side while in a flat bed without using bedrails?: A Little Help needed moving from lying on your back to sitting on the side of a flat bed without using bedrails?: A Little Help needed moving to and from a bed to a chair (including a wheelchair)?: A Little Help needed standing up from  a chair using your arms (e.g., wheelchair or bedside chair)?: A Little Help needed to walk in hospital room?: A Little Help needed climbing 3-5 steps with a railing? : A Lot 6 Click Score: 17    End of Session Equipment Utilized During Treatment: Gait belt Activity Tolerance: Patient tolerated treatment well Patient left: in chair;with call bell/phone within reach;with chair alarm set Nurse Communication: Mobility status PT Visit Diagnosis: Other abnormalities of gait and mobility (R26.89);Muscle weakness (generalized) (M62.81);Pain Pain - Right/Left: Right Pain - part of body: Hip    Time: 9098-9063 PT Time Calculation (min) (ACUTE ONLY): 35 min   Charges:   PT Evaluation $PT Eval Low Complexity: 1 Low   PT General Charges $$ ACUTE PT VISIT: 1 Visit         Bernardino JINNY Ruth, PT, DPT Acute Rehabilitation Office [phone]   Bernardino JINNY Ruth 02/08/2024, 9:49 AM

## 2024-02-08 NOTE — Progress Notes (Signed)
 Cardiology Progress Note  Patient ID: [name] MRN: [id number] DOB: 11-17-1931 Date of Encounter: 02/08/2024 Primary Cardiologist: Redell Leiter, MD  Subjective   Chief Complaint: R leg pain  HPI: Reports right leg pain.  In A-fib.  No chest pain or trouble breathing.  ROS:  All other ROS reviewed and negative. Pertinent positives noted in the HPI.     Telemetry  Overnight telemetry shows A-fib 100 to 120 bpm, which I personally reviewed.    Physical Exam   Vitals:   02/07/24 2253 02/08/24 0219 02/08/24 0300 02/08/24 0800  BP: 124/81  (!) 122/99 119/83  Pulse: (!) 107 (!) 120 (!) 128 (!) 116  Resp: 20  (!) 24 20  Temp: 98.5 F (36.9 C)  99 F (37.2 C)   TempSrc: Oral  Oral   SpO2: 96%  91% 92%  Weight:      Height:        Intake/Output Summary (Last 24 hours) at 02/08/2024 0930 Last data filed at 02/07/2024 1627 Gross per 24 hour  Intake 1400 ml  Output 100 ml  Net 1300 ml       02/07/2024   12:39 PM 02/06/2024   11:34 PM 09/22/2023   11:09 AM  Last 3 Weights  Weight (lbs) 145 lb 0.2 oz 145 lb 153 lb 2 oz  Weight (kg) 65.777 kg 65.772 kg 69.457 kg    Body mass index is 25.69 kg/m (pended).  General: Well nourished, well developed, in no acute distress Head: Atraumatic, normal size  Eyes: PEERLA, EOMI  Neck: Supple, no JVD Endocrine: No thryomegaly Cardiac: Normal S1, S2; irregular rhythm, 3 out of 6 holosystolic murmur Lungs: Clear to auscultation bilaterally, no wheezing, rhonchi or rales  Abd: Soft, nontender, no hepatomegaly  Ext: No edema, pulses 2+ Musculoskeletal: No deformities, BUE and BLE strength normal and equal Skin: Warm and dry, no rashes   Neuro: Alert and oriented to person, place, time, and situation, CNII-XII grossly intact, no focal deficits  Psych: Normal mood and affect   Cardiac Studies  TTE 02/07/2024  1. Severe paradoxical low flow low gradient aortic stenosis is present.  The aortic valve is tricuspid. There is severe  calcifcation of the aortic  valve. There is severe thickening of the aortic valve. Aortic valve  regurgitation is trivial. Severe aortic   valve stenosis. Aortic valve area, by VTI measures 0.85 cm. Aortic valve  mean gradient measures 15.8 mmHg. Aortic valve Vmax measures 2.65 m/s.   2. Concerns for severe mitral regurgitation. Significant splay artifact  concerning for at least moderate mitral regurgitation. Consider TEE for  clarification and quantification of mitral regurgitation. The mitral valve  is degenerative. Moderate to  severe mitral valve regurgitation. Severe mitral annular calcification.   3. Left ventricular ejection fraction, by estimation, is 45 to 50%. The  left ventricle has mildly decreased function. The left ventricle has no  regional wall motion abnormalities. There is moderate concentric left  ventricular hypertrophy. Left  ventricular diastolic function could not be evaluated.   4. Right ventricular systolic function is mildly reduced. The right  ventricular size is normal. There is normal pulmonary artery systolic  pressure. The estimated right ventricular systolic pressure is 30.0 mmHg.   5. Left atrial size was severely dilated.   6. Right atrial size was severely dilated.   7. Tricuspid valve regurgitation is mild to moderate.   8. The inferior vena cava is normal in size with greater than 50%  respiratory variability, suggesting  right atrial pressure of 3 mmHg.   Patient Profile  [name] is a 89 y.o. male with persistent A-fib, HFpEF, diabetes, hypertension, COPD, CAD status post PCI, OSA admitted on 02/07/2024 with fall and right hip fracture.  Cardiology consulted for A-fib with RVR.  Assessment & Plan   # A-fib with RVR - Discussed anticoagulation with hospital team.  Okay to start 5 mg twice daily. - Hold his irbesartan  and Imdur .  Increase metoprolol  to tartrate to 25 mg every 6 hours for better rate control.  We can work to consolidate this  over the next few days.  # Right hip fracture - Status post operative repair.  Doing well.  # Moderate to severe aortic stenosis # Moderate to severe mitral valve regurgitation - No symptoms from this.  He tells me he is probably not wanting to have any procedures done.  None indicated right now.  We can follow this as an outpatient.  # HFpEF - Stable.  No need for diuresis.  # CAD status post PCI - No angina.  Plan for Eliquis  monotherapy.  No need for aspirin. - Add Lipitor 40 mg daily.  # HTN - Hold irbesartan .  Hold Imdur .  Metoprolol  to tartrate 25 mg every 6 hours for A-fib.     For questions or updates, please contact [facility] Please consult [email or web address] for contact info under     Signed, Darryle T. Barbaraann, MD, [facility] [place]  [facility]  02/08/2024 9:30 AM

## 2024-02-08 NOTE — Progress Notes (Signed)
 PROGRESS NOTE  [name] FMW:[id number] DOB: 1931-12-31 DOA: 02/06/2024 PCP: Jordan, [name], MD   LOS: 1 day   Brief Narrative / Interim history: 89 year old male with history of CAD status post PCI, A-fib not on anticoagulation per his wishes, DM2, OSA not on CPAP, HTN came to the hospital after a fall at home, found to have right hip fracture.  He was also in A-fib with RVR.  Orthopedic surgery was consulted and he was admitted to the hospital  Subjective / 24h Interval events: Doing well today, hard of hearing but denies any chest pain, denies any shortness of breath, no palpitations.  Right hip is bothering him, feels pain is identical to prior to having surgery.  Apprehensive about working with therapy today.  Wife is at bedside  Assesement and Plan: Principal problem Right hip fracture-orthopedic surgery consulted, he is status post right hip hemiarthroplasty by Dr. Kendal on 10/27 - Back on Eliquis  - PT eval recommending HHPT  Active problems A-fib with RVR-has known history of A-fib, not on anticoagulation per his wishes.  Cardiology consulted and following - Still with poorly controlled rates this morning, metoprolol  has been increased and irbesartan  and Imdur  are being held  CAD-history of PCI, denies any chest pain today  Essential hypertension-continue medications as below, blood pressure stable  BPH-continue finasteride   Chronic diastolic CHF-appears compensated, not on diuretics  DM2-on glipizide  at home, continue sliding scale  OSA-not using CPAP  EtOH use-drinks whiskey every other day.  Keep on CIWA, no significant withdrawals  Scheduled Meds:  apixaban   5 mg Oral Q12H   atorvastatin   40 mg Oral Daily   celecoxib  200 mg Oral BID   docusate sodium   100 mg Oral BID   finasteride   5 mg Oral Daily   folic acid  1 mg Oral Daily   insulin  aspart  0-6 Units Subcutaneous Q4H   metoprolol  tartrate       metoprolol  tartrate  25 mg Oral Q6H   multivitamin  with minerals  1 tablet Oral Daily   thiamine  100 mg Oral Daily   Or   thiamine  100 mg Intravenous Daily   Continuous Infusions:   ceFAZolin  (ANCEF ) IV 2 g (02/08/24 0804)   PRN Meds:.acetaminophen , bisacodyl , diphenhydrAMINE, HYDROcodone -acetaminophen , HYDROcodone -acetaminophen , magnesium citrate, menthol  **OR** phenol, methocarbamol  **OR** methocarbamol  (ROBAXIN ) injection, metoCLOPramide  **OR** metoCLOPramide  (REGLAN ) injection, metoprolol  tartrate, metoprolol  tartrate, morphine  injection, ondansetron  **OR** ondansetron  (ZOFRAN ) IV, senna-docusate, zolpidem  Current Outpatient Medications  Medication Instructions   acetaminophen  (TYLENOL ) 1,000 mg, Oral, Every 6 hours PRN   alfuzosin  (UROXATRAL ) 10 mg, Oral, Daily with breakfast   B-12 1,000 mcg, Daily   Blood Glucose Monitoring Suppl (ACCU-CHEK AVIVA CONNECT) w/Device KIT 1 Device, Does not apply, Daily   Blood Glucose Monitoring Suppl (ONE TOUCH ULTRA 2) w/Device KIT Daily as directed.   citalopram  (CELEXA ) 10 mg, Oral, Daily   finasteride  (PROSCAR ) 5 mg, Oral, Daily   fluticasone  (FLONASE ) 50 MCG/ACT nasal spray 1 spray, Each Nare, 2 times daily   furosemide  (LASIX ) 40 mg, Oral, 2 times daily   glipiZIDE  (GLUCOTROL ) 5 MG tablet TAKE 1 TABLET BY MOUTH DAILY BEFORE BREAKFAST. 20-30 MINUTES BEFORE MEAL.   Ipratropium-Albuterol  (COMBIVENT  RESPIMAT) 20-100 MCG/ACT AERS respimat 1 puff, Inhalation, Every 6 hours PRN   isosorbide  mononitrate (IMDUR ) 30 mg, Oral, Daily   Lancet Devices (LANCET DEVICE WITH EJECTOR) MISC 1 Device, Does not apply, Daily   loperamide (IMODIUM A-D) 2 mg, 4 times daily PRN   nitroGLYCERIN  (  NITROSTAT ) 0.4 mg, Sublingual, Every 5 min PRN   OneTouch Delica Lancets 33G MISC USE TO CHECK BLOOD SUGAR DAILY AND PRN   ONETOUCH ULTRA test strip USE TO CHECK BLOOD SUGAR ONCE DAILY E11.9   tizanidine  (ZANAFLEX ) 8 mg, Oral, At bedtime PRN   valsartan  (DIOVAN ) 80 mg, Oral, Daily    Diet Orders (From admission, onward)      Start     Ordered   02/08/24 0707  Diet Carb Modified Fluid consistency: Thin; Room service appropriate? Yes  Diet effective now       Question Answer Comment  Calorie Level Medium 1600-2000   Fluid consistency: Thin   Room service appropriate? Yes      02/08/24 0706            DVT prophylaxis: SCDs Start: 02/08/24 0707 SCDs Start: 02/07/24 0506 apixaban  (ELIQUIS ) tablet 5 mg   Lab Results  Component Value Date   PLT 181 02/07/2024      Code Status: Limited: Do not attempt resuscitation (DNR) -DNR-LIMITED -Do Not Intubate/DNI   Family Communication: Wife present at bedside  Status is: Inpatient Remains inpatient appropriate because: Severity of illness   Level of care: Progressive  Consultants:  Orthopedic surgery  Objective: Vitals:   02/07/24 2253 02/08/24 0219 02/08/24 0300 02/08/24 0800  BP: 124/81  (!) 122/99 119/83  Pulse: (!) 107 (!) 120 (!) 128 (!) 116  Resp: 20  (!) 24 20  Temp: 98.5 F (36.9 C)  99 F (37.2 C)   TempSrc: Oral  Oral   SpO2: 96%  91% 92%  Weight:      Height:        Intake/Output Summary (Last 24 hours) at 02/08/2024 1102 Last data filed at 02/07/2024 1627 Gross per 24 hour  Intake 1400 ml  Output 100 ml  Net 1300 ml   Wt Readings from Last 3 Encounters:  02/07/24 (P) 65.8 kg  09/22/23 69.5 kg  10/28/22 70.5 kg    Examination:  Constitutional: NAD Eyes: lids and conjunctivae normal, no scleral icterus ENMT: mmm Neck: normal, supple Respiratory: clear to auscultation bilaterally, no wheezing, no crackles.  Cardiovascular: Irregularly irregular, tachycardic, no peripheral edema Abdomen: soft, no distention, no tenderness. Bowel sounds positive.    Data Reviewed: I have independently reviewed following labs and imaging studies   CBC Recent Labs  Lab 02/07/24 0229 02/07/24 0601  WBC 11.0* 8.5  HGB 13.7 13.2  HCT 42.0 40.8  PLT 174 181  MCV 94.6 95.3  MCH 30.9 30.8  MCHC 32.6 32.4  RDW 13.1 13.2   LYMPHSABS 0.8  --   MONOABS 0.7  --   EOSABS 0.0  --   BASOSABS 0.1  --     Recent Labs  Lab 02/07/24 0229 02/07/24 0601 02/08/24 0845  NA 142 143 139  K 3.9 4.2 3.8  CL 106 107 106  CO2 23 25 23   GLUCOSE 203* 202* 258*  BUN 19 18 13   CREATININE 0.78 0.89 0.82  CALCIUM  8.6* 8.7* 8.4*  AST 23  --   --   ALT 22  --   --   ALKPHOS 71  --   --   BILITOT 1.1  --   --   ALBUMIN 3.5  --   --   TSH  --  1.307  --   HGBA1C  --  6.8*  --     ------------------------------------------------------------------------------------------------------------------ No results for input(s): CHOL, HDL, LDLCALC, TRIG, CHOLHDL, LDLDIRECT in the last 72  hours.  Lab Results  Component Value Date   HGBA1C 6.8 (H) 02/07/2024   ------------------------------------------------------------------------------------------------------------------ Recent Labs    02/07/24 0601  TSH 1.307    Cardiac Enzymes No results for input(s): CKMB, TROPONINI, MYOGLOBIN in the last 168 hours.  Invalid input(s): CK ------------------------------------------------------------------------------------------------------------------    Component Value Date/Time   BNP 124 (H) 01/15/2017 1555    CBG: Recent Labs  Lab 02/07/24 1640 02/07/24 1939 02/07/24 2256 02/08/24 0319 02/08/24 0815  GLUCAP 142* 225* 165* 224* 258*    Recent Results (from the past 240 hours)  Surgical pcr screen     Status: None   Collection Time: 02/07/24  9:50 AM   Specimen: Nasal Mucosa; Nasal Swab  Result Value Ref Range Status   MRSA, PCR NEGATIVE NEGATIVE Final   Staphylococcus aureus NEGATIVE NEGATIVE Final    Comment: (NOTE) The Xpert SA Assay (FDA approved for NASAL specimens in patients 99 years of age and older), is one component of a comprehensive surveillance program. It is not intended to diagnose infection nor to guide or monitor treatment. Performed at [facility], [address]  [place], [place] [address]      Radiology Studies: DG HIP UNILAT WITH PELVIS 1V RIGHT Result Date: 02/07/2024 CLINICAL DATA:  Elective surgery. EXAM: DG HIP (WITH OR WITHOUT PELVIS) 1V RIGHT COMPARISON:  Preoperative imaging FINDINGS: Six fluoroscopic spot views of the pelvis and right hip obtained in the operating room. Sequential images during hip arthroplasty. Fluoroscopy time 9.8 seconds. Dose 0.9359 mGy. IMPRESSION: Intraoperative fluoroscopy during right hip hemiarthroplasty. Electronically Signed   By: Andrea Gasman M.D.   On: 02/07/2024 16:25   ECHOCARDIOGRAM COMPLETE Result Date: 02/07/2024    ECHOCARDIOGRAM REPORT   Patient Name:   [name] Date of Exam: 02/07/2024 Medical Rec #:  [id number]     Height:       63.0 in Accession #:    [id number]    Weight:       145.0 lb Date of Birth:  04-14-1931     BSA:          1.687 m Patient Age:    89 years      BP:           134/95 mmHg Patient Gender: M             HR:           124 bpm. Exam Location:  Inpatient Procedure: 2D Echo (Both Spectral and Color Flow Doppler were utilized during            procedure). Indications:    Murmur  History:        Patient has prior history of Echocardiogram examinations.                 Signs/Symptoms:Murmur.  Sonographer:    Norleen Amour Referring Phys: ZANE ADAMS IMPRESSIONS  1. Severe paradoxical low flow low gradient aortic stenosis is present. The aortic valve is tricuspid. There is severe calcifcation of the aortic valve. There is severe thickening of the aortic valve. Aortic valve regurgitation is trivial. Severe aortic  valve stenosis. Aortic valve area, by VTI measures 0.85 cm. Aortic valve mean gradient measures 15.8 mmHg. Aortic valve Vmax measures 2.65 m/s.  2. Concerns for severe mitral regurgitation. Significant splay artifact concerning for at least moderate mitral regurgitation. Consider TEE for clarification and quantification of mitral regurgitation. The mitral valve is degenerative. Moderate to  severe mitral valve regurgitation. Severe mitral  annular calcification.  3. Left ventricular ejection fraction, by estimation, is 45 to 50%. The left ventricle has mildly decreased function. The left ventricle has no regional wall motion abnormalities. There is moderate concentric left ventricular hypertrophy. Left ventricular diastolic function could not be evaluated.  4. Right ventricular systolic function is mildly reduced. The right ventricular size is normal. There is normal pulmonary artery systolic pressure. The estimated right ventricular systolic pressure is 30.0 mmHg.  5. Left atrial size was severely dilated.  6. Right atrial size was severely dilated.  7. Tricuspid valve regurgitation is mild to moderate.  8. The inferior vena cava is normal in size with greater than 50% respiratory variability, suggesting right atrial pressure of 3 mmHg. FINDINGS  Left Ventricle: Left ventricular ejection fraction, by estimation, is 45 to 50%. The left ventricle has mildly decreased function. The left ventricle has no regional wall motion abnormalities. The left ventricular internal cavity size was normal in size. There is moderate concentric left ventricular hypertrophy. Left ventricular diastolic function could not be evaluated due to atrial fibrillation. Left ventricular diastolic function could not be evaluated. Right Ventricle: The right ventricular size is normal. No increase in right ventricular wall thickness. Right ventricular systolic function is mildly reduced. There is normal pulmonary artery systolic pressure. The tricuspid regurgitant velocity is 2.60 m/s, and with an assumed right atrial pressure of 3 mmHg, the estimated right ventricular systolic pressure is 30.0 mmHg. Left Atrium: Left atrial size was severely dilated. Right Atrium: Right atrial size was severely dilated. Pericardium: Trivial pericardial effusion is present. Mitral Valve: Concerns for severe mitral regurgitation. Significant splay  artifact concerning for at least moderate mitral regurgitation. Consider TEE for clarification and quantification of mitral regurgitation. The mitral valve is degenerative in appearance. Severe mitral annular calcification. Moderate to severe mitral valve regurgitation. MV peak gradient, 9.2 mmHg. The mean mitral valve gradient is 4.0 mmHg. Tricuspid Valve: The tricuspid valve is grossly normal. Tricuspid valve regurgitation is mild to moderate. No evidence of tricuspid stenosis. Aortic Valve: Severe paradoxical low flow low gradient aortic stenosis is present. The aortic valve is tricuspid. There is severe calcifcation of the aortic valve. There is severe thickening of the aortic valve. Aortic valve regurgitation is trivial. Severe aortic stenosis is present. Aortic valve mean gradient measures 15.8 mmHg. Aortic valve peak gradient measures 28.0 mmHg. Aortic valve area, by VTI measures 0.85 cm. Pulmonic Valve: The pulmonic valve was grossly normal. Pulmonic valve regurgitation is trivial. No evidence of pulmonic stenosis. Aorta: The aortic root and ascending aorta are structurally normal, with no evidence of dilitation. Venous: The inferior vena cava is normal in size with greater than 50% respiratory variability, suggesting right atrial pressure of 3 mmHg. IAS/Shunts: The atrial septum is grossly normal.  LEFT VENTRICLE PLAX 2D LVIDd:         5.10 cm     Diastology LVIDs:         3.70 cm     LV e' medial:    7.65 cm/s LV PW:         1.20 cm     LV E/e' medial:  18.6 LV IVS:        1.40 cm     LV e' lateral:   9.85 cm/s LVOT diam:     1.95 cm     LV E/e' lateral: 14.4 LV SV:         43 LV SV Index:   25 LVOT Area:     2.99 cm  LV Volumes (MOD) LV vol d, MOD A2C: 73.9 ml LV vol d, MOD A4C: 90.6 ml LV vol s, MOD A2C: 44.6 ml LV vol s, MOD A4C: 49.8 ml LV SV MOD A2C:     29.3 ml LV SV MOD A4C:     90.6 ml LV SV MOD BP:      36.1 ml RIGHT VENTRICLE             IVC RV Basal diam:  3.10 cm     IVC diam: 1.80 cm RV S  prime:     15.70 cm/s TAPSE (M-mode): 1.9 cm LEFT ATRIUM              Index        RIGHT ATRIUM           Index LA diam:        4.80 cm  2.85 cm/m   RA Area:     20.20 cm LA Vol (A2C):   102.0 ml 60.48 ml/m  RA Volume:   52.20 ml  30.95 ml/m LA Vol (A4C):   57.9 ml  34.33 ml/m LA Biplane Vol: 80.8 ml  47.91 ml/m  AORTIC VALVE                     PULMONIC VALVE AV Area (Vmax):    0.92 cm      PV Vmax:       1.02 m/s AV Area (Vmean):   0.87 cm      PV Peak grad:  4.2 mmHg AV Area (VTI):     0.85 cm AV Vmax:           264.80 cm/s AV Vmean:          188.200 cm/s AV VTI:            0.503 m AV Peak Grad:      28.0 mmHg AV Mean Grad:      15.8 mmHg LVOT Vmax:         81.90 cm/s LVOT Vmean:        54.667 cm/s LVOT VTI:          0.143 m LVOT/AV VTI ratio: 0.28  AORTA Ao Root diam: 3.50 cm Ao Asc diam:  3.50 cm MITRAL VALVE                TRICUSPID VALVE MV Peak grad: 9.2 mmHg      TR Peak grad:   27.0 mmHg MV Mean grad: 4.0 mmHg      TR Vmax:        260.00 cm/s MV Vmax:      1.52 m/s MV Vmean:     93.5 cm/s     SHUNTS MV E velocity: 142.00 cm/s  Systemic VTI:  0.14 m                             Systemic Diam: 1.95 cm Darryle Decent MD Electronically signed by Darryle Decent MD Signature Date/Time: 02/07/2024/2:41:46 PM    Final      Nilda Fendt, MD, PhD [facility]  Between 7 am - 7 pm I am available, please contact me via Amion (for emergencies) or Securechat (non urgent messages)  Between 7 pm - 7 am I am not available, please contact night coverage MD/APP via Amion

## 2024-02-09 ENCOUNTER — Inpatient Hospital Stay (HOSPITAL_COMMUNITY)

## 2024-02-09 DIAGNOSIS — S72001D Fracture of unspecified part of neck of right femur, subsequent encounter for closed fracture with routine healing: Secondary | ICD-10-CM | POA: Diagnosis not present

## 2024-02-09 DIAGNOSIS — I5032 Chronic diastolic (congestive) heart failure: Secondary | ICD-10-CM | POA: Diagnosis not present

## 2024-02-09 DIAGNOSIS — G9341 Metabolic encephalopathy: Secondary | ICD-10-CM | POA: Insufficient documentation

## 2024-02-09 DIAGNOSIS — J189 Pneumonia, unspecified organism: Secondary | ICD-10-CM | POA: Insufficient documentation

## 2024-02-09 DIAGNOSIS — E440 Moderate protein-calorie malnutrition: Secondary | ICD-10-CM | POA: Insufficient documentation

## 2024-02-09 DIAGNOSIS — I4891 Unspecified atrial fibrillation: Secondary | ICD-10-CM | POA: Diagnosis not present

## 2024-02-09 LAB — CBC
HCT: 37.7 % — ABNORMAL LOW (ref 39.0–52.0)
Hemoglobin: 12.4 g/dL — ABNORMAL LOW (ref 13.0–17.0)
MCH: 31 pg (ref 26.0–34.0)
MCHC: 32.9 g/dL (ref 30.0–36.0)
MCV: 94.3 fL (ref 80.0–100.0)
Platelets: 160 K/uL (ref 150–400)
RBC: 4 MIL/uL — ABNORMAL LOW (ref 4.22–5.81)
RDW: 13 % (ref 11.5–15.5)
WBC: 13.4 K/uL — ABNORMAL HIGH (ref 4.0–10.5)
nRBC: 0 % (ref 0.0–0.2)

## 2024-02-09 LAB — COMPREHENSIVE METABOLIC PANEL WITH GFR
ALT: 8 U/L (ref 0–44)
AST: 17 U/L (ref 15–41)
Albumin: 3 g/dL — ABNORMAL LOW (ref 3.5–5.0)
Alkaline Phosphatase: 58 U/L (ref 38–126)
Anion gap: 10 (ref 5–15)
BUN: 20 mg/dL (ref 8–23)
CO2: 24 mmol/L (ref 22–32)
Calcium: 8.5 mg/dL — ABNORMAL LOW (ref 8.9–10.3)
Chloride: 107 mmol/L (ref 98–111)
Creatinine, Ser: 0.7 mg/dL (ref 0.61–1.24)
GFR, Estimated: >60 mL/min (ref >60)
Glucose, Bld: 209 mg/dL — ABNORMAL HIGH (ref 70–99)
Potassium: 4.1 mmol/L (ref 3.5–5.1)
Sodium: 141 mmol/L (ref 135–145)
Total Bilirubin: 1.2 mg/dL (ref 0.0–1.2)
Total Protein: 5.7 g/dL — ABNORMAL LOW (ref 6.5–8.1)

## 2024-02-09 LAB — MAGNESIUM: Magnesium: 1.9 mg/dL (ref 1.7–2.4)

## 2024-02-09 LAB — GLUCOSE, CAPILLARY
Glucose-Capillary: 203 mg/dL — ABNORMAL HIGH (ref 70–99)
Glucose-Capillary: 180 mg/dL — ABNORMAL HIGH (ref 70–99)
Glucose-Capillary: 164 mg/dL — ABNORMAL HIGH (ref 70–99)
Glucose-Capillary: 180 mg/dL — ABNORMAL HIGH (ref 70–99)
Glucose-Capillary: 243 mg/dL — ABNORMAL HIGH (ref 70–99)

## 2024-02-09 LAB — PHOSPHORUS: Phosphorus: 2.4 mg/dL — ABNORMAL LOW (ref 2.5–4.6)

## 2024-02-09 MED ORDER — APIXABAN 5 MG PO TABS: 5.0000 mg | ORAL_TABLET | Freq: Two times a day (BID) | ORAL | 0 refills | Status: DC

## 2024-02-09 MED ORDER — VITAMIN D (ERGOCALCIFEROL) 1.25 MG (50000 UNIT) PO CAPS: 50000.0000 [IU] | ORAL_CAPSULE | ORAL | 0 refills | Status: DC

## 2024-02-09 MED ORDER — SODIUM CHLORIDE 0.9% FLUSH: 10.0000 mL | INTRAVENOUS | Status: DC | PRN

## 2024-02-09 MED ORDER — FUROSEMIDE 10 MG/ML IJ SOLN
20.0000 mg | Freq: Once | INTRAMUSCULAR | Status: AC
  Administered 2024-02-09: 20 mg via INTRAVENOUS
  Filled 2024-02-09: qty 2

## 2024-02-09 MED ORDER — HYDROCODONE-ACETAMINOPHEN 5-325 MG PO TABS: 1.0000 | ORAL_TABLET | ORAL | 0 refills | Status: DC | PRN

## 2024-02-09 MED ORDER — SODIUM CHLORIDE 0.9% FLUSH
10.0000 mL | Freq: Two times a day (BID) | INTRAVENOUS | Status: DC
  Administered 2024-02-09 – 2024-02-16 (×16): 10 mL

## 2024-02-09 MED ORDER — DILTIAZEM HCL ER COATED BEADS 120 MG PO CP24
120.0000 mg | ORAL_CAPSULE | Freq: Every day | ORAL | Status: DC
  Administered 2024-02-10: 120 mg via ORAL
  Filled 2024-02-09 (×2): qty 1

## 2024-02-09 MED ORDER — METOPROLOL TARTRATE 5 MG/5ML IV SOLN
5.0000 mg | Freq: Once | INTRAVENOUS | Status: AC
  Administered 2024-02-09: 5 mg via INTRAVENOUS
  Filled 2024-02-09: qty 5

## 2024-02-09 MED ORDER — LORAZEPAM 2 MG/ML IJ SOLN
1.0000 mg | INTRAMUSCULAR | Status: DC | PRN
  Administered 2024-02-09: 2 mg via INTRAVENOUS
  Filled 2024-02-09: qty 1

## 2024-02-09 MED ORDER — K PHOS MONO-SOD PHOS DI & MONO 155-852-130 MG PO TABS
500.0000 mg | ORAL_TABLET | Freq: Two times a day (BID) | ORAL | Status: AC
  Administered 2024-02-09: 500 mg via ORAL
  Filled 2024-02-09 (×2): qty 2

## 2024-02-09 MED ORDER — LORAZEPAM 1 MG PO TABS: 1.0000 mg | ORAL_TABLET | ORAL | Status: DC | PRN

## 2024-02-09 MED ORDER — SODIUM CHLORIDE 0.9 % IV SOLN
2.0000 g | INTRAVENOUS | Status: AC
  Administered 2024-02-09 – 2024-02-13 (×5): 2 g via INTRAVENOUS
  Filled 2024-02-09 (×5): qty 20

## 2024-02-09 NOTE — Assessment & Plan Note (Signed)
 Glucose high normal - Continue sliding scale corrections - Hold home glipizide 

## 2024-02-09 NOTE — Progress Notes (Cosign Needed Addendum)
 Orthopaedic Trauma Progress Note  SUBJECTIVE: Patient well this afternoon. Resting comfortably in bed upon me entering the room.  Notes soreness through the right hip but overall pain controlled at rest.  Does increase some when working with therapy which I would expect.  Pain has been controlled with Tylenol  and hydrocodone .  Has not required muscle relaxer.  Denies any numbness or tingling throughout the right lower extremity.  No chest pain. No SOB. No nausea/vomiting. No other complaints.  Tolerating diet and fluids.  No family at bedside currently.  OBJECTIVE:  Vitals:   02/09/24 1149 02/09/24 1200  BP: 139/84 (!) 130/90  Pulse: 95 97  Resp: (!) 24 (!) 27  Temp: 97.6 F (36.4 C)   SpO2: 94% 91%    Opiates Today (MME): Today's  total administered Morphine  Milligram Equivalents: 0 Opiates Yesterday (MME): Yesterday's total administered Morphine  Milligram Equivalents: 25  General: Resting peacefully bed, no acute distress.  Pleasant and cooperative Respiratory: No increased work of breathing.  Operative Extremity (RLE): Aquacel dressing in place is clean, dry, intact.  Some bruising and swelling around the hip as expected.  Some soreness to the thigh.  Otherwise nontender to the lower leg, ankle, foot.  Ankle dorsiflexion/plantarflexion intact.  Able to wiggle toes.  No significant calf tenderness.  Endorses sensation light touch over all aspects of the foot. + DP pulse  IMAGING: Postop imaging of the right hip stable  LABS:  Results for orders placed or performed during the hospital encounter of 02/06/24 (from the past 24 hours)  Glucose, capillary     Status: Abnormal   Collection Time: 02/08/24  5:09 PM  Result Value Ref Range   Glucose-Capillary 234 (H) 70 - 99 mg/dL  Glucose, capillary     Status: Abnormal   Collection Time: 02/08/24  7:43 PM  Result Value Ref Range   Glucose-Capillary 263 (H) 70 - 99 mg/dL  Glucose, capillary     Status: Abnormal   Collection Time: 02/08/24  11:06 PM  Result Value Ref Range   Glucose-Capillary 172 (H) 70 - 99 mg/dL  Glucose, capillary     Status: Abnormal   Collection Time: 02/09/24  3:09 AM  Result Value Ref Range   Glucose-Capillary 203 (H) 70 - 99 mg/dL  CBC     Status: Abnormal   Collection Time: 02/09/24  7:48 AM  Result Value Ref Range   WBC 13.4 (H) 4.0 - 10.5 K/uL   RBC 4.00 (L) 4.22 - 5.81 MIL/uL   Hemoglobin 12.4 (L) 13.0 - 17.0 g/dL   HCT 62.2 (L) 60.9 - 47.9 %   MCV 94.3 80.0 - 100.0 fL   MCH 31.0 26.0 - 34.0 pg   MCHC 32.9 30.0 - 36.0 g/dL   RDW 86.9 88.4 - 84.4 %   Platelets 160 150 - 400 K/uL   nRBC 0.0 0.0 - 0.2 %  Comprehensive metabolic panel with GFR     Status: Abnormal   Collection Time: 02/09/24  7:48 AM  Result Value Ref Range   Sodium 141 135 - 145 mmol/L   Potassium 4.1 3.5 - 5.1 mmol/L   Chloride 107 98 - 111 mmol/L   CO2 24 22 - 32 mmol/L   Glucose, Bld 209 (H) 70 - 99 mg/dL   BUN 20 8 - 23 mg/dL   Creatinine, Ser 9.29 0.61 - 1.24 mg/dL   Calcium  8.5 (L) 8.9 - 10.3 mg/dL   Total Protein 5.7 (L) 6.5 - 8.1 g/dL   Albumin 3.0 (  L) 3.5 - 5.0 g/dL   AST 17 15 - 41 U/L   ALT 8 0 - 44 U/L   Alkaline Phosphatase 58 38 - 126 U/L   Total Bilirubin 1.2 0.0 - 1.2 mg/dL   GFR, Estimated >39 >39 mL/min   Anion gap 10 5 - 15  Magnesium     Status: None   Collection Time: 02/09/24  7:48 AM  Result Value Ref Range   Magnesium 1.9 1.7 - 2.4 mg/dL  Phosphorus     Status: Abnormal   Collection Time: 02/09/24  7:48 AM  Result Value Ref Range   Phosphorus 2.4 (L) 2.5 - 4.6 mg/dL  Glucose, capillary     Status: Abnormal   Collection Time: 02/09/24  8:09 AM  Result Value Ref Range   Glucose-Capillary 180 (H) 70 - 99 mg/dL  Glucose, capillary     Status: Abnormal   Collection Time: 02/09/24 11:53 AM  Result Value Ref Range   Glucose-Capillary 164 (H) 70 - 99 mg/dL    ASSESSMENT: [name] is a 89 y.o. male, 2 Days Post-Op s/p fall Procedures: LEFT HIP HEMIARTHROPLASTY FOR FRACTURE, ANTERIOR  APPROACH  CV/Blood loss: Acute blood loss anemia.  Hemoglobin 12.4 this morning.  Stable over the last 24 hours.  Hemodynamically stable  PLAN: Weightbearing: WBAT RLE ROM: Unrestricted ROM.  No hip precautions needed Incisional and dressing care: Dressings left intact until follow-up  Showering: Okay to shower, Aquacel dressing may get wet Orthopedic device(s): None  Pain management:  1. Tylenol  325-650 mg q 6 hours PRN 2. Robaxin  500 mg q 6 hours PRN 3. Norco 5-325 mg or 7.5-325 mg q 4 hours PRN 4. Morphine  0.5-1 mg q 2 hours PRN 5. Celebrex 200 mg BID  VTE prophylaxis: Eliquis , SCDs ID:  Ancef  2gm post op completed.  Currently on ceftriaxone  for CAP Foley/Lines:  No foley, KVO IVFs Impediments to Fracture Healing: Diabetes.  Vitamin D level 5, start on supplementation   Dispo: PT/OT evaluation ongoing, recommending HH. Ok for d/c form ortho standpoint once cleared by medicine team and therapies.   D/C recommendations: - Norco, Tylenol  for pain control - Eliquis  twice daily x 30 days for DVT prophylaxis - Continue 50,000 units Vit D supplementation weekly x 5 weeks  Follow - up plan: 2 weeks after d/c for wound check and repeat x-rays   Contact information:  Franky Light MD, Lauraine Moores PA-C. After hours and holidays please check [email or web address] for group call information for [facility]   Lauraine PATRIC Moores, PA-C [phone] (office) [email or web address]

## 2024-02-09 NOTE — Assessment & Plan Note (Signed)
 Not able to take PO today due to delirium.   - IV metoprolol  - Resume oral metoprolol  and new diltiazem when able to take PO

## 2024-02-09 NOTE — Assessment & Plan Note (Addendum)
-   Continue Lipitor - Hold Imdur  - Consult cardiology, appreciate cares

## 2024-02-09 NOTE — Assessment & Plan Note (Signed)
 New O2 requirement overnight, CXR shows pneumonia, reports dyspnea.   - Start Roecphin, day 1 of 5 - IS and flutter as able given delirium

## 2024-02-09 NOTE — Progress Notes (Addendum)
   02/09/24 0757  Vitals  Temp 98.4 F (36.9 C)  Temp Source Oral  BP (!) 125/98  MAP (mmHg) 107  BP Location Right Arm  BP Method Automatic  Patient Position (if appropriate) Lying  Pulse Rate (!) 103  Pulse Rate Source Monitor  ECG Heart Rate (!) 107  Resp (!) 24  Level of Consciousness  Level of Consciousness Responds to Voice  MEWS COLOR  MEWS Score Color Yellow  Oxygen Therapy  SpO2 90 %  O2 Device Nasal Cannula  O2 Flow Rate (L/min) 2 L/min  Pain Assessment  Pain Scale CPOT  Critical Care Pain Observation Tool (CPOT)  Facial Expression 0  Body Movements 0  Muscle Tension 0  Compliance with ventilator (intubated pts.) N/A  Vocalization (extubated pts.) 0  CPOT Total 0  PCA/Epidural/Spinal Assessment  Respiratory Pattern Regular;Unlabored  MEWS Score  MEWS Temp 0  MEWS Systolic 0  MEWS Pulse 1  MEWS RR 1  MEWS LOC 1  MEWS Score 3   Pt restless in bed and pulling leads off at times but also drowsy. Pt responds to voice but doesn't stay awake to answer questions, currently not following commands either. He received ativan at 0230, due to drowsiness unable to admin oral meds. VS stable. Dr. Jonel notified came to bedside, see new orders.

## 2024-02-09 NOTE — Progress Notes (Signed)
 Cardiology Progress Note  Patient ID: [name] MRN: [id number] DOB: [birth year] Date of Encounter: 02/09/2024 Primary Cardiologist: Redell Leiter, MD  Subjective   Chief Complaint: Confused   HPI: Confusion overnight.  A-fib heart rate 120s.  ROS:  All other ROS reviewed and negative. Pertinent positives noted in the HPI.     Telemetry  Overnight telemetry shows A-fib 120s, which I personally reviewed.     Physical Exam   Vitals:   02/09/24 0210 02/09/24 0235 02/09/24 0335 02/09/24 0757  BP:  (!) 141/94 (!) 141/94 (!) 125/98  Pulse:  (!) 104  (!) 103  Resp:    (!) 24  Temp: (!) 97.5 F (36.4 C)   98.4 F (36.9 C)  TempSrc: Axillary   Oral  SpO2:    90%  Weight:      Height:        Intake/Output Summary (Last 24 hours) at 02/09/2024 1030 Last data filed at 02/09/2024 0600 Gross per 24 hour  Intake 120.92 ml  Output 800 ml  Net -679.08 ml       02/07/2024   12:39 PM 02/06/2024   11:34 PM 09/22/2023   11:09 AM  Last 3 Weights  Weight (lbs) 145 lb 0.2 oz 145 lb 153 lb 2 oz  Weight (kg) 65.777 kg 65.772 kg 69.457 kg    Body mass index is 25.69 kg/m (pended).  General: Drowsy, ill-appearing Head: Atraumatic, normal size  Eyes: PEERLA, EOMI  Neck: Supple, no JVD Endocrine: No thryomegaly Cardiac: Normal S1, S2; irregular rhythm, 3/6 holosystolic murmur Lungs: Clear to auscultation bilaterally, no wheezing, rhonchi or rales  Abd: Soft, nontender, no hepatomegaly  Ext: No edema, pulses 2+ Musculoskeletal: No deformities Skin: Warm and dry, no rashes   Neuro: Drowsy, alert, not following commands  Cardiac Studies  TTE 02/07/2024  1. Severe paradoxical low flow low gradient aortic stenosis is present.  The aortic valve is tricuspid. There is severe calcifcation of the aortic  valve. There is severe thickening of the aortic valve. Aortic valve  regurgitation is trivial. Severe aortic   valve stenosis. Aortic valve area, by VTI measures 0.85 cm. Aortic  valve  mean gradient measures 15.8 mmHg. Aortic valve Vmax measures 2.65 m/s.   2. Concerns for severe mitral regurgitation. Significant splay artifact  concerning for at least moderate mitral regurgitation. Consider TEE for  clarification and quantification of mitral regurgitation. The mitral valve  is degenerative. Moderate to  severe mitral valve regurgitation. Severe mitral annular calcification.   3. Left ventricular ejection fraction, by estimation, is 45 to 50%. The  left ventricle has mildly decreased function. The left ventricle has no  regional wall motion abnormalities. There is moderate concentric left  ventricular hypertrophy. Left  ventricular diastolic function could not be evaluated.   4. Right ventricular systolic function is mildly reduced. The right  ventricular size is normal. There is normal pulmonary artery systolic  pressure. The estimated right ventricular systolic pressure is 30.0 mmHg.   5. Left atrial size was severely dilated.   6. Right atrial size was severely dilated.   7. Tricuspid valve regurgitation is mild to moderate.   8. The inferior vena cava is normal in size with greater than 50%  respiratory variability, suggesting right atrial pressure of 3 mmHg.   Patient Profile  [name] is a 89 y.o. male with permanent A-fib, HFpEF, diabetes, hypertension, COPD, CAD status post PCI, OSA admitted on 02/07/2024 with fall and right hip fracture.  Cardiology  consulted for A-fib with RVR.  Assessment & Plan   # A-fib with RVR - Currently delirious after hip surgery.  Plan to continue metoprolol  to tartrate 25 mg every 6 hours.  I will add diltiazem extended release 120 mg daily to see if this will help as well. - Back on Eliquis .  Hemoglobin stable after surgery.  # Encephalopathy # Acute delirium - Likely related to recent hip surgery.  Per hospital team.  # Right hip fracture - Per hospital team.  # Moderate to severe aortic stenosis # Moderate to  severe mitral valve regurgitation - For now conservative approach.  We will follow-up this as an outpatient.  # Heart failure with preserved ejection, EF 50% -EF 50%.  No signs of volume overload.  # HTN - Holding home irbesartan  and Imdur .  On metoprolol  and diltiazem as above.  # CAD s/p PCI - No angina.  Plan for Eliquis  monotherapy.  Lipitor 40.     For questions or updates, please contact [facility] Please consult [email or web address] for contact info under     Signed, Darryle T. Barbaraann, MD, [facility]  [facility]  02/09/2024 10:30 AM

## 2024-02-09 NOTE — Plan of Care (Signed)

## 2024-02-09 NOTE — Assessment & Plan Note (Signed)
 Blood pressure normal - Continue diltiazem, metoprolol  - Hold furosemide , valsartan , Imdur 

## 2024-02-09 NOTE — Assessment & Plan Note (Signed)
 Not on CPAP

## 2024-02-09 NOTE — Progress Notes (Signed)
  Progress Note   Patient: [name] FMW:[id number] DOB: 21-Nov-1931 DOA: 02/06/2024     2 DOS: the patient was seen and examined on 02/09/2024 at 8:52AM      Brief hospital course: 89 y.o. M with CAD s/p PCI >5 yrs, HTN, dCHF, DM, AF not on AC due to choice, OSA not on CPAP who presented with fall, found to have right hip fracture and was in Afib.  Underwent right hemiarthroplasty on 10/27 by Dr. Kendal.  Post-op course complicated by delirium.     Assessment and Plan: * Hip fracture (HCC) - PT/OT  Acute metabolic encephalopathy The patient has developed hospital delirium due to fracture, anesthesia.  No alcohol withdrawal symptoms prior, not DTs. - Stop lorazepam, favor Haldol - Typical delirium precautions  Malnutrition of moderate degree - Continue nutritional supplements and MVI - Consult dietitian  Atrial fibrillation with RVR (HCC) Not able to take PO today due to delirium.   - IV metoprolol  - Resume oral metoprolol  and new diltiazem when able to take PO  (HFpEF) heart failure with preserved ejection fraction (HCC) Appears euvolemic - Hold furosemide  for now  Coronary artery disease involving native coronary artery of native heart with angina pectoris - Continue Lipitor - Hold Imdur  - Consult cardiology, appreciate cares  Hypertension with heart disease Blood pressure normal - Continue diltiazem, metoprolol  - Hold furosemide , valsartan , Imdur   OBSTRUCTIVE SLEEP APNEA Not on CPAP  Type 2 diabetes mellitus with diabetic neuropathy, unspecified (HCC) Glucose high normal - Continue sliding scale corrections - Hold home glipizide           Subjective: Patient was delirious overnight.  Pulling at lines, resisting cares.  He is not febrile.  On supplemental oxygen.  Tachypneic.     Physical Exam: BP (!) 125/98 (BP Location: Right Arm)   Pulse (!) 103   Temp 98.4 F (36.9 C) (Oral)   Resp (!) 24   Ht (P) 5' 3 (1.6 m)   Wt (P) 65.8 kg   SpO2  90%   BMI (P) 25.69 kg/m   Elderly adult male, lying in bed, somnolent, tachypneic Tachycardic, irregular, no murmurs appreciated, no JVD or peripheral edema Respiratory rate seems increased, lung sounds diminished, crackles on the right Abdomen soft, no grimace to palpation He does not rouse and follow commands, quite somnolent after recent medication.  Wife reports that he was very delirious previously, did not recognize her.  Although at baseline he has no cognitive impairment    Data Reviewed: Basic metabolic panel shows normal electrolytes and renal function CBC shows no leukocytosis Chest x-ray shows no infiltrate     Family Communication: Wife at the bedside    Disposition: Status is: Inpatient         Author: [name], MD 02/09/2024 11:59 AM  For on call review [email or web address].

## 2024-02-09 NOTE — Anesthesia Postprocedure Evaluation (Signed)
 Anesthesia Post Note  Patient: [name]  Procedure(s) Performed: HEMIARTHROPLASTY, HIP, DIRECT ANTERIOR APPROACH, FOR FRACTURE (Right: Hip)     Patient location during evaluation: PACU Anesthesia Type: General Level of consciousness: awake and alert Pain management: pain level controlled Vital Signs Assessment: post-procedure vital signs reviewed and stable Respiratory status: spontaneous breathing, nonlabored ventilation, respiratory function stable and patient connected to nasal cannula oxygen Cardiovascular status: blood pressure returned to baseline and stable Postop Assessment: no apparent nausea or vomiting Anesthetic complications: no   No notable events documented.  Last Vitals:  Vitals:   02/09/24 0210 02/09/24 0235  BP:  (!) 141/94  Pulse:    Resp:    Temp: (!) 36.4 C   SpO2:      Last Pain:  Vitals:   02/09/24 0210  TempSrc: Axillary  PainSc:    Pain Goal: Patients Stated Pain Goal: 0 (02/07/24 0801)                 [name]

## 2024-02-09 NOTE — Progress Notes (Signed)
 A midline has been ordered for this patient. The VAST RN has reviewed the patient's medical record including any arm restrictions, current creatinine clearance, length IV therapy is needed, and infusions needed/ordered to determine if a midline is the appropriate line for this individual patient. If there are contraindications, the physician and primary RN have been contacted by [facility] RN for further discussion. Midline Education: a midline is a long peripheral IV placed in the upper arm with the tip located at or near the axilla and distal to the shoulder should not be used as a CLABSI preventative measure   has one lumen only can remain in place for up to 29 days  Safe for Vancomycin infusion LESS THAN 6 days Safe for power injection if good blood return can be obtained and line flushes easily CANNOT be used for continuous infusion of vesicants. Including TPN and chemotherapy Should NOT be placed for sole intent of obtaining labs as there is no guarantee blood can be successfully drawn from line  contraindicated in patients with thrombosis, hypercoagulability, decreased venous flow to the extremities, ESRD (without a nephrologist's approval), small vessels, allergy to polyurethane, or known/suspected presence of a device-related infection, bacteremia, or septicemia

## 2024-02-09 NOTE — Assessment & Plan Note (Signed)
 The patient has developed hospital delirium due to fracture, anesthesia.  No alcohol withdrawal symptoms prior, not DTs. - Stop lorazepam, favor Haldol - Typical delirium precautions

## 2024-02-09 NOTE — Progress Notes (Signed)
 Physical Therapy Treatment Patient Details Name: [name] MRN: [id number] DOB: 1931/08/25 Today's Date: 02/09/2024   History of Present Illness 89 y.o. male presents to [facility] hospital on 02/06/2024 after a fall. Pt found to have R hip fx, underwent R hip hemiarthroplasty on 10/27. PMH includes CAD, afib, DMII, OSA, HTN.    PT Comments  Pt tolerates treatment well, ambulating and transferring multiple times during session. Pt's processing is slower than yesterday as pt appears groggy, however he follows commands well. Pt provides education to ps' daughter and granddaughters regarding maintaining arousal during typical waking hours for the patient and keeping the patient oriented and engaged during the day to allow for rest at night. PT continues to recommend HHPT pending further improvement in mobility status. If the pt's spouse does not feel she is able to meet the pt's needs short term inpatient PT may need to be considered, although transferring to an unfamiliar environment may also have a detrimental effect on the patient's cognition.     If plan is discharge home, recommend the following: A little help with walking and/or transfers;Assistance with cooking/housework;A lot of help with bathing/dressing/bathroom;Assist for transportation;Help with stairs or ramp for entrance   Can travel by private vehicle        Equipment Recommendations  BSC/3in1    Recommendations for Other Services       Precautions / Restrictions Precautions Precautions: Fall Recall of Precautions/Restrictions: Intact Precaution/Restrictions Comments: no hip precautions Restrictions Weight Bearing Restrictions Per Provider Order: Yes RLE Weight Bearing Per Provider Order: Weight bearing as tolerated     Mobility  Bed Mobility Overal bed mobility: Needs Assistance Bed Mobility: Supine to Sit     Supine to sit: Min assist, HOB elevated, Used rails          Transfers Overall transfer level: Needs  assistance Equipment used: Rolling walker (2 wheels) Transfers: Sit to/from Stand, Bed to chair/wheelchair/BSC Sit to Stand: Min assist, Contact guard assist   Step pivot transfers: Contact guard assist       General transfer comment: cues for hand placement and to increase anterior weight shift during transfers. Pt stands 5 times during session    Ambulation/Gait Ambulation/Gait assistance: Contact guard assist Gait Distance (Feet): 30 Feet Assistive device: Rolling walker (2 wheels) Gait Pattern/deviations: Step-through pattern Gait velocity: reduced Gait velocity interpretation: <1.31 ft/sec, indicative of household ambulator   General Gait Details: slowed step-through gait, reduced stride length   Stairs             Wheelchair Mobility     Tilt Bed    Modified Rankin (Stroke Patients Only)       Balance Overall balance assessment: Needs assistance Sitting-balance support: No upper extremity supported, Feet supported Sitting balance-Leahy Scale: Good     Standing balance support: Bilateral upper extremity supported, Reliant on assistive device for balance Standing balance-Leahy Scale: Poor                              Communication Communication Communication: Impaired Factors Affecting Communication: Hearing impaired  Cognition Arousal: Alert Behavior During Therapy: WFL for tasks assessed/performed   PT - Cognitive impairments: Problem solving                       PT - Cognition Comments: slowed processing Following commands: Intact      Cueing Cueing Techniques: Verbal cues  Exercises  General Comments General comments (skin integrity, edema, etc.): pt on 3L [place] upon PT arrival, weaned briefly to room air for ambulation with desaturation to 89%. Pt tolerates 2L [place] for transfers to conclude session.      Pertinent Vitals/Pain Pain Assessment Pain Assessment: No/denies pain    Home Living                           Prior Function            PT Goals (current goals can now be found in the care plan section) Acute Rehab PT Goals Patient Stated Goal: to return to prior level of function Progress towards PT goals: Progressing toward goals    Frequency    Min 3X/week      PT Plan      Co-evaluation              AM-PAC PT 6 Clicks Mobility   Outcome Measure  Help needed turning from your back to your side while in a flat bed without using bedrails?: A Little Help needed moving from lying on your back to sitting on the side of a flat bed without using bedrails?: A Little Help needed moving to and from a bed to a chair (including a wheelchair)?: A Little Help needed standing up from a chair using your arms (e.g., wheelchair or bedside chair)?: A Little Help needed to walk in hospital room?: A Little Help needed climbing 3-5 steps with a railing? : A Lot 6 Click Score: 17    End of Session Equipment Utilized During Treatment: Gait belt;Oxygen Activity Tolerance: Patient tolerated treatment well Patient left: in chair;with call bell/phone within reach;with chair alarm set;with family/visitor present Nurse Communication: Mobility status PT Visit Diagnosis: Other abnormalities of gait and mobility (R26.89);Muscle weakness (generalized) (M62.81);Pain Pain - Right/Left: Right Pain - part of body: Hip     Time: 1212-1250 PT Time Calculation (min) (ACUTE ONLY): 38 min  Charges:    $Gait Training: 8-22 mins $Therapeutic Activity: 23-37 mins PT General Charges $$ ACUTE PT VISIT: 1 Visit                     Bernardino JINNY Ruth, PT, DPT Acute Rehabilitation Office [phone]    Bernardino JINNY Ruth 02/09/2024, 1:15 PM

## 2024-02-09 NOTE — Assessment & Plan Note (Signed)
Appears euvolemic  -Hold furosemide for now

## 2024-02-09 NOTE — Assessment & Plan Note (Addendum)
-   PT/OT - US  negative

## 2024-02-09 NOTE — Progress Notes (Signed)
 OT Cancellation Note  Patient Details Name: [name] MRN: [id number] DOB: 23-Apr-1931   Cancelled Treatment:    Reason Eval/Treat Not Completed: Fatigue/lethargy limiting ability to participate Pt was sleeping when entering the room. He was able to report his name and his wife was in the room. Then started to pull off mitts and then fell back asleep. Will follow up as able. Thank you.  Warrick POUR OTR/L  Acute Rehab Services  [phone] office number   Warrick Berber 02/09/2024, 10:21 AM

## 2024-02-09 NOTE — Significant Event (Signed)
 Trauma Event Note    Reason for Call :  Called by primary RN to eval patient with new onset confusion and hypoxia.  Post op day two, hospital day three for right hip hemiarthroplasty after a fall.   Initial Focused Assessment:  Pt alert to self only, typically alertx4. Pt with some hypoxia, down to 89% on room air but improved to 93 with 2L [place].    Interventions:  Portable chest XRAY O2 [place] Family updates, spouse at bedside  Plan of Care:  20mg  lasix  once, ativan CIWA dosing  Event Summary:  TRN called to bedside to evaulate patient with new onset confusion/agitation and hypoxia. Pt alert to self only on my arrival, typically alert x4 and independent. Primary RN noted some hypoxia, down to 89% on room air. Placed on [place] with some improvement. Pt with some abdominal distention and wheezing/diminished LS. Known hx of whiskey drinking but reportedly every other day. Pt in mitts and constantly moving around in bed. No diuretics ordered on hospitalization but appears to be taking some in community.   TRN ordered chest XRAY from protocols and contacted floor coverage. Received order for lasix  and ativan per CIWA dosing. Primary RN Rollene aware.  MD Notified: JINNY Kipper notified [phone], answered [phone] Call Time: 0039 Arrival Time: 0041 End Time: 0117  Last imported Vital Signs BP 116/74 (BP Location: Left Arm)   Pulse (!) 104   Temp 99.1 F (37.3 C) (Axillary)   Resp (!) 24   Ht (P) 5' 3 (1.6 m)   Wt (P) 145 lb 0.2 oz (65.8 kg)   SpO2 92%   BMI (P) 25.69 kg/m   Trending CBC Recent Labs    02/07/24 0229 02/07/24 0601 02/08/24 0845  WBC 11.0* 8.5 13.7*  HGB 13.7 13.2 12.6*  HCT 42.0 40.8 38.5*  PLT 174 181 157    Trending Coag's No results for input(s): APTT, INR in the last 72 hours.  Trending BMET Recent Labs    02/07/24 0229 02/07/24 0601 02/08/24 0845  NA 142 143 139  K 3.9 4.2 3.8  CL 106 107 106  CO2 23 25 23   BUN 19 18 13   CREATININE 0.78 0.89  0.82  GLUCOSE 203* 202* 258*      [name] [name]  Trauma Response RN  Please call TRN at [phone] for further assistance.

## 2024-02-09 NOTE — Assessment & Plan Note (Signed)
-   Continue nutritional supplements and MVI - Consult dietitian

## 2024-02-09 NOTE — Hospital Course (Signed)
 89 y.o. M with CAD s/p PCI >5 yrs, HTN, dCHF, DM, AF not on AC due to choice, OSA not on CPAP who presented with fall, found to have right hip fracture and was in Afib.  Underwent right hemiarthroplasty on 10/27 by Dr. Kendal.  Post-op course complicated by delirium.

## 2024-02-10 ENCOUNTER — Ambulatory Visit: Admitting: Family Medicine

## 2024-02-10 DIAGNOSIS — S72001D Fracture of unspecified part of neck of right femur, subsequent encounter for closed fracture with routine healing: Secondary | ICD-10-CM | POA: Diagnosis not present

## 2024-02-10 DIAGNOSIS — I4891 Unspecified atrial fibrillation: Secondary | ICD-10-CM | POA: Diagnosis not present

## 2024-02-10 DIAGNOSIS — I5032 Chronic diastolic (congestive) heart failure: Secondary | ICD-10-CM | POA: Diagnosis not present

## 2024-02-10 LAB — CBC
HCT: 38.9 % — ABNORMAL LOW (ref 39.0–52.0)
Hemoglobin: 12.7 g/dL — ABNORMAL LOW (ref 13.0–17.0)
MCH: 30.9 pg (ref 26.0–34.0)
MCHC: 32.6 g/dL (ref 30.0–36.0)
MCV: 94.6 fL (ref 80.0–100.0)
Platelets: 172 K/uL (ref 150–400)
RBC: 4.11 MIL/uL — ABNORMAL LOW (ref 4.22–5.81)
RDW: 13 % (ref 11.5–15.5)
WBC: 10.7 K/uL — ABNORMAL HIGH (ref 4.0–10.5)
nRBC: 0 % (ref 0.0–0.2)

## 2024-02-10 LAB — RENAL FUNCTION PANEL
Albumin: 2.8 g/dL — ABNORMAL LOW (ref 3.5–5.0)
Anion gap: 13 (ref 5–15)
BUN: 22 mg/dL (ref 8–23)
CO2: 25 mmol/L (ref 22–32)
Calcium: 8.5 mg/dL — ABNORMAL LOW (ref 8.9–10.3)
Chloride: 103 mmol/L (ref 98–111)
Creatinine, Ser: 0.64 mg/dL (ref 0.61–1.24)
GFR, Estimated: >60 mL/min (ref >60)
Glucose, Bld: 170 mg/dL — ABNORMAL HIGH (ref 70–99)
Phosphorus: 3 mg/dL (ref 2.5–4.6)
Potassium: 3.9 mmol/L (ref 3.5–5.1)
Sodium: 141 mmol/L (ref 135–145)

## 2024-02-10 LAB — GLUCOSE, CAPILLARY
Glucose-Capillary: 146 mg/dL — ABNORMAL HIGH (ref 70–99)
Glucose-Capillary: 159 mg/dL — ABNORMAL HIGH (ref 70–99)
Glucose-Capillary: 163 mg/dL — ABNORMAL HIGH (ref 70–99)
Glucose-Capillary: 184 mg/dL — ABNORMAL HIGH (ref 70–99)
Glucose-Capillary: 191 mg/dL — ABNORMAL HIGH (ref 70–99)
Glucose-Capillary: 198 mg/dL — ABNORMAL HIGH (ref 70–99)
Glucose-Capillary: 218 mg/dL — ABNORMAL HIGH (ref 70–99)
Glucose-Capillary: 251 mg/dL — ABNORMAL HIGH (ref 70–99)

## 2024-02-10 MED ORDER — SENNOSIDES-DOCUSATE SODIUM 8.6-50 MG PO TABS
1.0000 | ORAL_TABLET | Freq: Every day | ORAL | Status: DC
  Administered 2024-02-10 – 2024-02-12 (×2): 1 via ORAL
  Filled 2024-02-10 (×3): qty 1

## 2024-02-10 MED ORDER — DILTIAZEM HCL ER COATED BEADS 120 MG PO CP24
120.0000 mg | ORAL_CAPSULE | Freq: Once | ORAL | Status: AC
  Administered 2024-02-10: 120 mg via ORAL
  Filled 2024-02-10: qty 1

## 2024-02-10 MED ORDER — DILTIAZEM HCL ER COATED BEADS 120 MG PO CP24
240.0000 mg | ORAL_CAPSULE | Freq: Every day | ORAL | Status: DC
  Administered 2024-02-11 – 2024-02-17 (×7): 240 mg via ORAL
  Filled 2024-02-10 (×7): qty 2

## 2024-02-10 MED ORDER — FUROSEMIDE 40 MG PO TABS
40.0000 mg | ORAL_TABLET | Freq: Every day | ORAL | Status: DC
  Administered 2024-02-10 – 2024-02-17 (×7): 40 mg via ORAL
  Filled 2024-02-10 (×7): qty 1

## 2024-02-10 MED ORDER — METOPROLOL TARTRATE 50 MG PO TABS
50.0000 mg | ORAL_TABLET | Freq: Two times a day (BID) | ORAL | Status: DC
  Administered 2024-02-10: 50 mg via ORAL
  Filled 2024-02-10: qty 1

## 2024-02-10 MED ORDER — POLYETHYLENE GLYCOL 3350 17 G PO PACK
17.0000 g | PACK | Freq: Every day | ORAL | Status: DC
  Administered 2024-02-10 – 2024-02-12 (×2): 17 g via ORAL
  Filled 2024-02-10 (×3): qty 1

## 2024-02-10 NOTE — Progress Notes (Signed)
 Inpatient Rehab Admissions Coordinator:   Per therapy recommendations, patient was screened for CIR candidacy by Leita Kleine, MS, CCC-SLP. At this time, Pt. Does not have a diagnosis that payor will likely approve for CIR. Additionally, he is contact guard on eval and I do not anticipate CIR to have an available bed until next week.I suspect he can progress to dc home with [facility]. I  will not pursue a rehab consult for this Pt.   Recommend other rehab venues to be pursued.  Please contact me with any questions.  Leita Kleine, MS, CCC-SLP Rehab Admissions Coordinator  [phone] (celll) [phone] (office)

## 2024-02-10 NOTE — Evaluation (Signed)
 Occupational Therapy Evaluation Patient Details Name: [name] MRN: [id number] DOB: 1931-07-07 Today's Date: 02/10/2024   History of Present Illness   89 y.o. male presents to [facility] hospital on 02/06/2024 after a fall. Pt found to have R hip fx, underwent R hip hemiarthroplasty on 10/27. PMH includes CAD, afib, DMII, OSA, HTN.     Clinical Impressions Pt at this time was much more alert and was abel to participate in session. He was able to sit at the EOB and required CGA to then min assist while completion of UE bathing as unaware of balance while sitting. He then required max-total assist for peri care in standing position. Pt then was able to ambulate to chair with min-CGA with cues on safety. Patient will benefit from continued inpatient follow up therapy, <3 hours/day as pt's wife is unsure she will be able to care for the pt as needs to be at a supervision level.      If plan is discharge home, recommend the following:   A little help with walking and/or transfers;A lot of help with bathing/dressing/bathroom;Assistance with cooking/housework;Assist for transportation;Help with stairs or ramp for entrance     Functional Status Assessment   Patient has had a recent decline in their functional status and demonstrates the ability to make significant improvements in function in a reasonable and predictable amount of time.     Equipment Recommendations   Tub/shower seat;Tub/shower bench     Recommendations for Other Services   Rehab consult     Precautions/Restrictions   Precautions Precautions: Fall Recall of Precautions/Restrictions: Intact Precaution/Restrictions Comments: no hip precautions Restrictions Weight Bearing Restrictions Per Provider Order: Yes RLE Weight Bearing Per Provider Order: Weight bearing as tolerated     Mobility Bed Mobility Overal bed mobility: Needs Assistance Bed Mobility: Supine to Sit Rolling: Min assist, Mod assist Sidelying to  sit: Min assist Supine to sit: Min assist, Mod assist, HOB elevated, Used rails     General bed mobility comments: cues on positioning    Transfers Overall transfer level: Needs assistance Equipment used: Rolling walker (2 wheels) Transfers: Sit to/from Stand Sit to Stand: Contact guard assist, Min assist                  Balance Overall balance assessment: Needs assistance Sitting-balance support: Feet supported Sitting balance-Leahy Scale: Fair Sitting balance - Comments: lateral leaning Postural control: Posterior lean, Right lateral lean, Left lateral lean Standing balance support: Bilateral upper extremity supported, Reliant on assistive device for balance Standing balance-Leahy Scale: Poor Standing balance comment: reliant on RW                           ADL either performed or assessed with clinical judgement   ADL Overall ADL's : Needs assistance/impaired Eating/Feeding: Independent;Sitting   Grooming: Wash/dry hands;Wash/dry face;Applying deodorant;Contact guard assist;Minimal assistance;Sitting   Upper Body Bathing: Contact guard assist;Minimal assistance;Sitting   Lower Body Bathing: Maximal assistance;Sit to/from stand   Upper Body Dressing : Contact guard assist;Sitting   Lower Body Dressing: Maximal assistance;Sit to/from stand   Toilet Transfer: Contact guard assist;Cueing for safety;Cueing for sequencing;BSC/3in1;Rolling walker (2 wheels)   Toileting- Clothing Manipulation and Hygiene: Maximal assistance;Sit to/from stand       Functional mobility during ADLs: Contact guard assist;Rolling walker (2 wheels);Cueing for sequencing;Cueing for safety       Vision         Perception  Praxis         Pertinent Vitals/Pain Pain Assessment Pain Assessment: Faces Faces Pain Scale: Hurts even more Facial Expression: Relaxed, neutral Body Movements: Absence of movements Muscle Tension: Relaxed Compliance with ventilator  (intubated pts.): N/A Vocalization (extubated pts.): N/A CPOT Total: 0 Pain Location: R hip Pain Descriptors / Indicators: Sore Pain Intervention(s): Limited activity within patient's tolerance, Monitored during session, Repositioned, Ice applied     Extremity/Trunk Assessment Upper Extremity Assessment Upper Extremity Assessment: Generalized weakness   Lower Extremity Assessment Lower Extremity Assessment: Defer to PT evaluation   Cervical / Trunk Assessment Cervical / Trunk Assessment: Kyphotic   Communication Communication Communication: Impaired Factors Affecting Communication: Hearing impaired   Cognition Arousal: Alert Behavior During Therapy: WFL for tasks assessed/performed Cognition: No apparent impairments                               Following commands: Intact       Cueing  General Comments   Cueing Techniques: Verbal cues  pt on 1L at the start of session at 94% and then was taken to RA and was at 93%   Exercises     Shoulder Instructions      Home Living Family/patient expects to be discharged to:: Private residence Living Arrangements: Spouse/significant other Available Help at Discharge: Family;Available 24 hours/day Type of Home: House Home Access: Stairs to enter [employer] of Steps: 4 Entrance Stairs-Rails: Left Home Layout: Able to live on main level with bedroom/bathroom;Multi-level     Bathroom Shower/Tub: Chief Strategy Officer: Standard Bathroom Accessibility: Yes How Accessible: Accessible via walker Home Equipment: Rolling Walker (2 wheels);Rollator (4 wheels);Cane - single point;Grab bars - tub/shower          Prior Functioning/Environment Prior Level of Function : Independent/Modified Independent             Mobility Comments: ambulatory most often with SPC, utilizes RW or rollator PRN      OT Problem List: Decreased strength;Decreased activity tolerance;Impaired balance (sitting  and/or standing);Decreased safety awareness;Decreased knowledge of use of DME or AE;Pain   OT Treatment/Interventions: Self-care/ADL training;Therapeutic exercise;DME and/or AE instruction;Therapeutic activities;Patient/family education;Balance training      OT Goals(Current goals can be found in the care plan section)   Acute Rehab OT Goals Patient Stated Goal: to get better OT Goal Formulation: With patient Time For Goal Achievement: 02/24/24 Potential to Achieve Goals: Fair   OT Frequency:  Min 2X/week    Co-evaluation              AM-PAC OT 6 Clicks Daily Activity     Outcome Measure Help from another person eating meals?: None Help from another person taking care of personal grooming?: A Little Help from another person toileting, which includes using toliet, bedpan, or urinal?: A Little Help from another person bathing (including washing, rinsing, drying)?: A Lot Help from another person to put on and taking off regular upper body clothing?: A Little Help from another person to put on and taking off regular lower body clothing?: A Lot 6 Click Score: 17   End of Session Equipment Utilized During Treatment: Gait belt;Rolling walker (2 wheels) Nurse Communication: Mobility status  Activity Tolerance: Patient tolerated treatment well Patient left: in chair;with call bell/phone within reach;with chair alarm set  OT Visit Diagnosis: Unsteadiness on feet (R26.81);Other abnormalities of gait and mobility (R26.89);Repeated falls (R29.6);Muscle weakness (generalized) (M62.81);Pain Pain - Right/Left: Right Pain -  part of body: Hip                Time: 0925-1010 OT Time Calculation (min): 45 min Charges:  OT General Charges $OT Visit: 1 Visit OT Evaluation $OT Eval Low Complexity: 1 Low OT Treatments $Self Care/Home Management : 23-37 mins  Warrick POUR OTR/L  Acute Rehab Services  [phone] office number   Warrick Berber 02/10/2024, 10:19 AM

## 2024-02-10 NOTE — Plan of Care (Signed)

## 2024-02-10 NOTE — Progress Notes (Signed)
 Physical Therapy Treatment Patient Details Name: [name] MRN: [id number] DOB: 19-Aug-1931 Today's Date: 02/10/2024   History of Present Illness 89 y.o. male presents to [facility] hospital on 02/06/2024 after a fall. Pt found to have R hip fx, underwent R hip hemiarthroplasty on 10/27. PMH includes CAD, afib, DMII, OSA, HTN.    PT Comments  The pt is making gradual progress, now ambulating an increased distance of up to ~75 ft before fatiguing. While he is only needing CGA-minA for transfers and CGA to ambulate with a RW, he is needing modA to navigate x2 stairs. He has x4 STE his home and his wife is in her 71s and has a bad back per the pt's daughter. The pt is at risk for falls. Per discussion with OT earlier, the pt's wife is concerned about being able to provide the level of care he is currently requiring and with his risk for falls. Per OT, pt is currently requiring maxA for lower body ADLs and max-total assist for peri care in standing. Due to these concerns and pt's current functional status, will update d/c recs to short-term inpatient rehab, < 3 hours/day, but am hopeful he can progress quickly to flip to going home with HHPT instead. He did not qualify for AIR unfortunately. Discussed pt's current functional status and the options of inpatient rehab vs HHPT with pt and his daughter. They reported they would think on it and discuss it with the pt's wife and decide soon. Will continue to follow acutely.   If plan is discharge home, recommend the following: A little help with walking and/or transfers;Assistance with cooking/housework;A lot of help with bathing/dressing/bathroom;Assist for transportation;Help with stairs or ramp for entrance;Supervision due to cognitive status   Can travel by private vehicle     Yes  Equipment Recommendations  BSC/3in1    Recommendations for Other Services       Precautions / Restrictions Precautions Precautions: Fall Recall of Precautions/Restrictions:  Intact Precaution/Restrictions Comments: no hip precautions Restrictions Weight Bearing Restrictions Per Provider Order: Yes RLE Weight Bearing Per Provider Order: Weight bearing as tolerated     Mobility  Bed Mobility Overal bed mobility: Needs Assistance Bed Mobility: Sit to Supine       Sit to supine: Min assist, HOB elevated   General bed mobility comments: Cued pt to cross his ankles to allow his L leg to assist his R with lifting back onto bed, but pt unsuccessful, needing minA to lift legs onto bed with return to supine    Transfers Overall transfer level: Needs assistance Equipment used: Rolling walker (2 wheels) Transfers: Sit to/from Stand Sit to Stand: Min assist, Contact guard assist           General transfer comment: Pt with intermittent posterior lean and LOB, needing cues to shift his weight anteriorly and intermittent minA to correct and regain his balance. x3 reps from EOB, x2 reps from chair    Ambulation/Gait Ambulation/Gait assistance: Contact guard assist Gait Distance (Feet): 75 Feet (x2 bouts of ~75 ft > ~20 ft) Assistive device: Rolling walker (2 wheels) Gait Pattern/deviations: Step-through pattern, Decreased stride length, Trunk flexed Gait velocity: reduced Gait velocity interpretation: <1.31 ft/sec, indicative of household ambulator   General Gait Details: slowed step-through gait, reduced stride length, flexed posture. Verbal and tactile cues provided to stand upright. No LOB, CGA for safety   Stairs Stairs: Yes Stairs assistance: Mod assist Stair Management: One rail Left, One rail Right, Step to pattern, Forwards Number of  Stairs: 2 General stair comments: Ascends with bil hands on L rail and descends with bil hands on R rail to simulate home. Educated pt to lead up with his good L leg and down with his bad R leg, needing reminders during stair negotiation. When ascending, pt often did not shift his weight anteriorly enough and  displayed poor power up on his L leg, cues provided to correct and pt needing modA to lean anteriorly, maintain balance, and power up each step. Pt with poor knee eccentric control when descending, needing modA for balance   Wheelchair Mobility     Tilt Bed    Modified Rankin (Stroke Patients Only)       Balance Overall balance assessment: Needs assistance Sitting-balance support: No upper extremity supported, Feet supported Sitting balance-Leahy Scale: Good     Standing balance support: Bilateral upper extremity supported, Reliant on assistive device for balance Standing balance-Leahy Scale: Poor Standing balance comment: reliant on UE support                            Communication Communication Communication: Impaired Factors Affecting Communication: Hearing impaired  Cognition Arousal: Alert Behavior During Therapy: WFL for tasks assessed/performed   PT - Cognitive impairments: Problem solving                       PT - Cognition Comments: slowed processing intermittently, needs cues to problem solve how to fix his balance with stairs and transfers Following commands: Intact      Cueing Cueing Techniques: Verbal cues, Tactile cues, Visual cues  Exercises Other Exercises Other Exercises: x3 serial sit <> stand with RW, CGA-minA    General Comments General comments (skin integrity, edema, etc.): Educated pt and daughter on options of SNF vs HH therapy and on pt's current level of function and need for assistance.      Pertinent Vitals/Pain Pain Assessment Pain Assessment: Faces Faces Pain Scale: Hurts even more Pain Location: R hip Pain Descriptors / Indicators: Discomfort, Grimacing, Operative site guarding, Sore Pain Intervention(s): Limited activity within patient's tolerance, Monitored during session, Repositioned    Home Living                          Prior Function            PT Goals (current goals can now be  found in the care plan section) Acute Rehab PT Goals Patient Stated Goal: to return to prior level of function PT Goal Formulation: With patient/family Time For Goal Achievement: 02/22/24 Potential to Achieve Goals: Good Progress towards PT goals: Progressing toward goals    Frequency    Min 3X/week      PT Plan      Co-evaluation              AM-PAC PT 6 Clicks Mobility   Outcome Measure  Help needed turning from your back to your side while in a flat bed without using bedrails?: A Little Help needed moving from lying on your back to sitting on the side of a flat bed without using bedrails?: A Little Help needed moving to and from a bed to a chair (including a wheelchair)?: A Little Help needed standing up from a chair using your arms (e.g., wheelchair or bedside chair)?: A Little Help needed to walk in hospital room?: A Little Help needed climbing 3-5 steps with a railing? :  A Lot 6 Click Score: 17    End of Session Equipment Utilized During Treatment: Gait belt Activity Tolerance: Patient tolerated treatment well Patient left: with call bell/phone within reach;with family/visitor present;in bed;with bed alarm set Nurse Communication: Mobility status PT Visit Diagnosis: Other abnormalities of gait and mobility (R26.89);Muscle weakness (generalized) (M62.81);Pain;Unsteadiness on feet (R26.81);Difficulty in walking, not elsewhere classified (R26.2) Pain - Right/Left: Right Pain - part of body: Hip     Time: 1220-1255 PT Time Calculation (min) (ACUTE ONLY): 35 min  Charges:    $Gait Training: 8-22 mins $Therapeutic Activity: 8-22 mins PT General Charges $$ ACUTE PT VISIT: 1 Visit                     Theo Ferretti, PT, DPT Acute Rehabilitation Services  Office: [phone]    Theo CHRISTELLA Ferretti 02/10/2024, 2:23 PM

## 2024-02-10 NOTE — Progress Notes (Signed)
 Cardiology Progress Note  Patient ID: MASAI KIDD MRN: [id number] DOB: 12-14-1931 Date of Encounter: 02/10/2024 Primary Cardiologist: Redell Leiter, MD  Subjective   Chief Complaint: None.   HPI: More alert today.  A-fib heart rate around 100.  No chest pain or trouble breathing.  Wife at bedside.  ROS:  All other ROS reviewed and negative. Pertinent positives noted in the HPI.     Telemetry  Overnight telemetry shows A-fib 100 to 120 bpm, which I personally reviewed.    Physical Exam   Vitals:   02/10/24 0142 02/10/24 0235 02/10/24 0313 02/10/24 0815  BP:   (!) 123/95 (!) 137/105  Pulse:  (!) 101 100 (!) 103  Resp: (!) 23  (!) 27 (!) 24  Temp:   97.6 F (36.4 C) 98.4 F (36.9 C)  TempSrc:   Oral Oral  SpO2:   91% 94%  Weight:      Height:        Intake/Output Summary (Last 24 hours) at 02/10/2024 1110 Last data filed at 02/09/2024 1850 Gross per 24 hour  Intake 100 ml  Output 500 ml  Net -400 ml       02/07/2024   12:39 PM 02/06/2024   11:34 PM 09/22/2023   11:09 AM  Last 3 Weights  Weight (lbs) 145 lb 0.2 oz 145 lb 153 lb 2 oz  Weight (kg) 65.777 kg 65.772 kg 69.457 kg    Body mass index is 25.69 kg/m (pended).  General: Well nourished, well developed, in no acute distress Head: Atraumatic, normal size  Eyes: PEERLA, EOMI  Neck: Supple, no JVD Endocrine: No thryomegaly Cardiac: Normal S1, S2; irregular rhythm, 3 out of 6 holosystolic murmur  Lungs: Diminished breath sounds bilaterally Abd: Soft, nontender, no hepatomegaly  Ext: No edema, pulses 2+ Musculoskeletal: No deformities, BUE and BLE strength normal and equal Skin: Warm and dry, no rashes   Neuro: Alert and oriented to person, place, time, and situation, CNII-XII grossly intact, no focal deficits  Psych: Normal mood and affect   Cardiac Studies  TTE 02/07/2024  1. Severe paradoxical low flow low gradient aortic stenosis is present.  The aortic valve is tricuspid. There is severe  calcifcation of the aortic  valve. There is severe thickening of the aortic valve. Aortic valve  regurgitation is trivial. Severe aortic   valve stenosis. Aortic valve area, by VTI measures 0.85 cm. Aortic valve  mean gradient measures 15.8 mmHg. Aortic valve Vmax measures 2.65 m/s.   2. Concerns for severe mitral regurgitation. Significant splay artifact  concerning for at least moderate mitral regurgitation. Consider TEE for  clarification and quantification of mitral regurgitation. The mitral valve  is degenerative. Moderate to  severe mitral valve regurgitation. Severe mitral annular calcification.   3. Left ventricular ejection fraction, by estimation, is 45 to 50%. The  left ventricle has mildly decreased function. The left ventricle has no  regional wall motion abnormalities. There is moderate concentric left  ventricular hypertrophy. Left  ventricular diastolic function could not be evaluated.   4. Right ventricular systolic function is mildly reduced. The right  ventricular size is normal. There is normal pulmonary artery systolic  pressure. The estimated right ventricular systolic pressure is 30.0 mmHg.   5. Left atrial size was severely dilated.   6. Right atrial size was severely dilated.   7. Tricuspid valve regurgitation is mild to moderate.   8. The inferior vena cava is normal in size with greater than 50%  respiratory variability,  suggesting right atrial pressure of 3 mmHg.   Patient Profile  DERRON PIPKINS is a 89 y.o. male with permanent A-fib, HFpEF, diabetes, hypertension, CAD status post PCI, COPD, OSA admitted on 02/07/2024 with fall and right hip fracture.  Cardiology consulted for A-fib with RVR.  Assessment & Plan   # A-fib with RVR - Rate still bit elevated.  Transition metoprolol  to tartrate 50 mg twice daily.  Increase diltiazem to 240 mg extended release.  May need to add digoxin.  Blood pressures are stable. - Continue Eliquis  5 mg twice daily.  This is  the appropriate dose.  # Right hip fracture - Status post surgical repair.  PT/OT.   # Encephalopathy # Delirium - Improving today.  # Heart failure with preserved ejection fraction, EF roughly 50% - No signs of volume overload.  Recovering from surgery.  # Moderate to severe aortic stenosis # Moderate to severe mitral regurgitation - Plan to follow-up outpatient.  No major symptoms from this.  Unclear if he would want intervention in the future.  We discussed.  # HTN - Hold irbesartan  and Imdur .  Metoprolol  and diltiazem.  # CAD status post PCI - Eliquis  monotherapy.  Lipitor.      For questions or updates, please contact [facility] Please consult [email or web address] for contact info under      Signed, Darryle T. Barbaraann, MD, [facility]  [facility]  02/10/2024 11:10 AM

## 2024-02-10 NOTE — Progress Notes (Signed)
 Pt and all belongings transferred to [address]08 with daughter at bedside. Report given and all questions answered.

## 2024-02-10 NOTE — Progress Notes (Signed)
  Progress Note   Patient: [name] FMW:[id number] DOB: June 07, 1931 DOA: 02/06/2024     3 DOS: the patient was seen and examined on 02/10/2024 at 8:55AM      Brief hospital course: 89 y.o. M with CAD s/p PCI >5 yrs, HTN, dCHF, DM, AF not on AC due to choice, OSA not on CPAP who presented with fall, found to have right hip fracture and was in Afib.  Underwent right hemiarthroplasty on 10/27 by Dr. Kendal.  Post-op course complicated by delirium.     Assessment and Plan: * Hip fracture (HCC) S/p right hemiarthroplasty on 10/27 by Dr. Kendal Progressing slowly.  Likely will need SNF - PT/OT  CAP (community acquired pneumonia) Hypoxia, dyspnea and new infiltrate on 10/29.    - Continue ceftriaxone  2 of 5 - IS, demonstrated to patient today  Acute metabolic encephalopathy The patient has developed hospital delirium due to fracture, anesthesia.  No alcohol withdrawal symptoms prior, not DTs.  Improved off Lorazepam  - Typical delirium precautions  Malnutrition of moderate degree - Continue nutritional supplements and MVI - Consult dietitian  Atrial fibrillation with RVR (HCC) - Continue metoprolol  and diltiazem, titration per Cardiology  - Continue Eliquis   (HFpEF) heart failure with preserved ejection fraction (HCC) Appears euvolemic - Resume Lasix   Coronary artery disease involving native coronary artery of native heart with angina pectoris - Continue Lipitor - Hold Imdur , valsartan  - Consult cardiology, appreciate cares  Hypertension with heart disease Blood pressure normal - Continue diltiazem, metoprolol  - Hold valsartan , Imdur  for now  OBSTRUCTIVE SLEEP APNEA Not on CPAP  Type 2 diabetes mellitus with diabetic neuropathy, unspecified (HCC) Glucose high normal - Continue sliding scale corrections - Hold home glipizide           Subjective: No new complaints.  No delirium overnight last night.  No fever, no dyspnea.     Physical Exam: BP (!)  139/95 (BP Location: Left Arm)   Pulse 95   Temp 98.7 F (37.1 C) (Oral)   Resp (!) 28   Ht (P) 5' 3 (1.6 m)   Wt (P) 65.8 kg   SpO2 94%   BMI (P) 25.69 kg/m   Elderly adult male, psychomotor slowing noted but interactive and oriented x3 Tachycardic, irregular, no murmurs, no peripheral edema Respiratory rate increased, crackles bilaterally Abdomen soft, no TTP Upper extremity strength 5/5, face symmetric, speech fluent, oriented x3    Data Reviewed: BMP unremarkable CBC shows mild anemia, resolved leukocytosis Tele shows afib, rates mostly 90s    Family Communication: Wife    Disposition: Status is: Inpatient         Author: [name], MD 02/10/2024 5:34 PM  For on call review [email or web address].

## 2024-02-11 DIAGNOSIS — I2510 Atherosclerotic heart disease of native coronary artery without angina pectoris: Secondary | ICD-10-CM | POA: Diagnosis not present

## 2024-02-11 DIAGNOSIS — I509 Heart failure, unspecified: Secondary | ICD-10-CM | POA: Diagnosis not present

## 2024-02-11 DIAGNOSIS — I10 Essential (primary) hypertension: Secondary | ICD-10-CM | POA: Diagnosis not present

## 2024-02-11 DIAGNOSIS — I4891 Unspecified atrial fibrillation: Secondary | ICD-10-CM | POA: Diagnosis not present

## 2024-02-11 LAB — TYPE AND SCREEN
ABO/RH(D): O POS
Antibody Screen: POSITIVE
Donor AG Type: NEGATIVE
Donor AG Type: NEGATIVE
Donor AG Type: NEGATIVE
Unit division: 0
Unit division: 0
Unit division: 0

## 2024-02-11 LAB — RENAL FUNCTION PANEL
Albumin: 2.6 g/dL — ABNORMAL LOW (ref 3.5–5.0)
Anion gap: 10 (ref 5–15)
BUN: 22 mg/dL (ref 8–23)
CO2: 25 mmol/L (ref 22–32)
Calcium: 8 mg/dL — ABNORMAL LOW (ref 8.9–10.3)
Chloride: 103 mmol/L (ref 98–111)
Creatinine, Ser: 0.63 mg/dL (ref 0.61–1.24)
GFR, Estimated: >60 mL/min (ref >60)
Glucose, Bld: 146 mg/dL — ABNORMAL HIGH (ref 70–99)
Phosphorus: 2.7 mg/dL (ref 2.5–4.6)
Potassium: 3.7 mmol/L (ref 3.5–5.1)
Sodium: 138 mmol/L (ref 135–145)

## 2024-02-11 LAB — CBC
HCT: 35.3 % — ABNORMAL LOW (ref 39.0–52.0)
Hemoglobin: 11.8 g/dL — ABNORMAL LOW (ref 13.0–17.0)
MCH: 31.1 pg (ref 26.0–34.0)
MCHC: 33.4 g/dL (ref 30.0–36.0)
MCV: 93.1 fL (ref 80.0–100.0)
Platelets: 188 K/uL (ref 150–400)
RBC: 3.79 MIL/uL — ABNORMAL LOW (ref 4.22–5.81)
RDW: 13 % (ref 11.5–15.5)
WBC: 8.4 K/uL (ref 4.0–10.5)
nRBC: 0 % (ref 0.0–0.2)

## 2024-02-11 LAB — BPAM RBC
Blood Product Expiration Date: 202511192359
Blood Product Expiration Date: 202511192359
Blood Product Expiration Date: 202511252359
Unit Type and Rh: 9500
Unit Type and Rh: 9500
Unit Type and Rh: 9500

## 2024-02-11 LAB — GLUCOSE, CAPILLARY
Glucose-Capillary: 149 mg/dL — ABNORMAL HIGH (ref 70–99)
Glucose-Capillary: 183 mg/dL — ABNORMAL HIGH (ref 70–99)
Glucose-Capillary: 171 mg/dL — ABNORMAL HIGH (ref 70–99)
Glucose-Capillary: 212 mg/dL — ABNORMAL HIGH (ref 70–99)
Glucose-Capillary: 270 mg/dL — ABNORMAL HIGH (ref 70–99)

## 2024-02-11 MED ORDER — HALOPERIDOL LACTATE 5 MG/ML IJ SOLN
2.0000 mg | Freq: Once | INTRAMUSCULAR | Status: AC
  Administered 2024-02-11: 2 mg via INTRAVENOUS
  Filled 2024-02-11: qty 1

## 2024-02-11 MED ORDER — QUETIAPINE FUMARATE 25 MG PO TABS
25.0000 mg | ORAL_TABLET | Freq: Every day | ORAL | Status: DC
  Administered 2024-02-11 – 2024-02-12 (×2): 25 mg via ORAL
  Filled 2024-02-11 (×2): qty 1

## 2024-02-11 NOTE — Progress Notes (Signed)
 Physical Therapy Treatment Patient Details Name: [name] MRN: [id number] DOB: 1931-04-16 Today's Date: 02/11/2024   History of Present Illness 89 y.o. male presents to [facility] hospital on 02/06/2024 after a fall. Pt found to have R hip fx, underwent R hip hemiarthroplasty on 10/27. PMH includes CAD, afib, DMII, OSA, HTN.    PT Comments  Pt greeted supine in bed, pleasant and agreeable to PT session. He is making steady progress towards his acute PT goals demonstrated by advanced functional mobility with decreased physical assistance. Pt appears to have good recall of education from prior therapy sessions. He increased gait distance, ambulating ~125ft using a step-through gait pattern. Pt ascended/descended 4 steps with bilateral handrails. Overall, he largely required CGA for functional mobility using RW. Will continue to follow acutely and advance appropriately.     If plan is discharge home, recommend the following: A little help with walking and/or transfers;Assistance with cooking/housework;A lot of help with bathing/dressing/bathroom;Assist for transportation;Help with stairs or ramp for entrance;Supervision due to cognitive status   Can travel by private vehicle     Yes  Equipment Recommendations  BSC/3in1;Other (comment) (tub bench)    Recommendations for Other Services       Precautions / Restrictions Precautions Precautions: Fall Recall of Precautions/Restrictions: Intact Precaution/Restrictions Comments: no hip precautions Restrictions Weight Bearing Restrictions Per Provider Order: Yes RLE Weight Bearing Per Provider Order: Weight bearing as tolerated     Mobility  Bed Mobility Overal bed mobility: Needs Assistance Bed Mobility: Supine to Sit, Sit to Supine     Supine to sit: Contact guard, HOB elevated Sit to supine: Min assist, HOB elevated   General bed mobility comments: Pt sat up on L side of bed with increased time. He slowly brought BLE off EOB and elevated  trunk with use of bed rail. CGA for safety. Educated pt on use of LLE to hook around R ankle to help support and lift it back into bed, but unsuccessful. Pt required minA to manage RLE. Educated pt on use of gait belt/dog leash/belt to wrap around R foot so he can pull up on item with BUE support to manage RLE, will attempt next session. Pt repositioned himself in bed by pulling on bed rails and pushing with B knee flex.    Transfers Overall transfer level: Needs assistance Equipment used: Rolling walker (2 wheels) Transfers: Sit to/from Stand, Bed to chair/wheelchair/BSC Sit to Stand: Contact guard assist   Step pivot transfers: Contact guard assist       General transfer comment: Pt stood from lowest bed height, commode, and recliner chair. He demonstrated proper hand placement using RW. Cued pt to scoot fwd to edge, increase B knee flex so feet were tucked underneath him, and educated on the use of momentum to power up. Pt stood and slowly brought BUE onto RW. Good eccentric control with sitting. Tranferred bed<>recliner chair.    Ambulation/Gait Ambulation/Gait assistance: Contact guard assist Gait Distance (Feet): 100 Feet (1x15, seated rest break, 1x100, standing rest break, 1x75) Assistive device: Rolling walker (2 wheels) Gait Pattern/deviations: Step-through pattern, Decreased stride length, Trunk flexed, Narrow base of support Gait velocity: reduced Gait velocity interpretation: <1.31 ft/sec, indicative of household ambulator   General Gait Details: Pt ambulated with slow steps. He barely achieved step-through patter. Pt demonstrated reduced stride length and flexed posture. Cues for proximity to RW, heel strike, and increase BOS to shoulder width apart. Pt manuevered within room, bathroom, and hallway well without LOB. CGA for safety.  Stairs Stairs: Yes Stairs assistance: Contact guard assist Stair Management: One rail Left, One rail Right, Step to pattern, Forwards Number  of Stairs: 2 (x2) General stair comments: Rolled pt to Ortho gym in recliner chair. Pt recalled stair training education to ascend with LLE and descend with RLE. He utilized BUE support on railings, but only has L handrail at home. Discussed having family support on his R side or having another rail installed. Pt was able to ascend smoothly with anterior weight shift and good power up on BUE to clear step. Pt had slight difficulty with descending, opting to slightly squat on LLE to increase knee flex prior to bring RLE down. Instructed pt to come to the edge of step and increase BOS. He demonstrated decreased eccentric control but was able to control himself well using BUE support on rail. Pt complete two reps and did not require cues for sequencing on second attempt. Returned pt to room in by rolling in recliner chair.   Wheelchair Mobility     Tilt Bed    Modified Rankin (Stroke Patients Only)       Balance Overall balance assessment: Needs assistance Sitting-balance support: No upper extremity supported, Feet supported Sitting balance-Leahy Scale: Good     Standing balance support: Bilateral upper extremity supported, Reliant on assistive device for balance Standing balance-Leahy Scale: Poor Standing balance comment: Pt dependent on RW                            Communication Communication Communication: Impaired Factors Affecting Communication: Hearing impaired  Cognition Arousal: Alert Behavior During Therapy: WFL for tasks assessed/performed   PT - Cognitive impairments: No apparent impairments                         Following commands: Intact      Cueing Cueing Techniques: Verbal cues, Tactile cues, Visual cues  Exercises      General Comments General comments (skin integrity, edema, etc.): VSS on RA. Daughter present and supportive throughout. Discussed d/c disposition of SNF vs. HH, pt's CLOF, and the amount of support he would need at home.       Pertinent Vitals/Pain Pain Assessment Pain Assessment: Faces Faces Pain Scale: Hurts little more Pain Location: R hip Pain Descriptors / Indicators: Discomfort, Grimacing, Operative site guarding, Sore Pain Intervention(s): Monitored during session, Limited activity within patient's tolerance, Repositioned    Home Living                          Prior Function            PT Goals (current goals can now be found in the care plan section) Acute Rehab PT Goals Patient Stated Goal: Regain independence and return Home PT Goal Formulation: With patient/family Time For Goal Achievement: 02/22/24 Potential to Achieve Goals: Good Progress towards PT goals: Progressing toward goals    Frequency    Min 3X/week      PT Plan      Co-evaluation              AM-PAC PT 6 Clicks Mobility   Outcome Measure  Help needed turning from your back to your side while in a flat bed without using bedrails?: A Little Help needed moving from lying on your back to sitting on the side of a flat bed without using bedrails?: A Little Help needed  moving to and from a bed to a chair (including a wheelchair)?: A Little Help needed standing up from a chair using your arms (e.g., wheelchair or bedside chair)?: A Little Help needed to walk in hospital room?: A Little Help needed climbing 3-5 steps with a railing? : A Little 6 Click Score: 18    End of Session Equipment Utilized During Treatment: Gait belt Activity Tolerance: Patient tolerated treatment well Patient left: in bed;with bed alarm set;with call bell/phone within reach;with family/visitor present Nurse Communication: Mobility status PT Visit Diagnosis: Other abnormalities of gait and mobility (R26.89);Muscle weakness (generalized) (M62.81);Pain;Unsteadiness on feet (R26.81);Difficulty in walking, not elsewhere classified (R26.2) Pain - Right/Left: Right Pain - part of body: Hip     Time: 8576-8494 PT Time  Calculation (min) (ACUTE ONLY): 42 min  Charges:    $Gait Training: 23-37 mins $Therapeutic Activity: 8-22 mins PT General Charges $$ ACUTE PT VISIT: 1 Visit                     Randall SAUNDERS, PT, DPT Acute Rehabilitation Services Office: [phone] Secure Chat Preferred  [name] 02/11/2024, 3:46 PM

## 2024-02-11 NOTE — Plan of Care (Signed)
  Problem: Education: Goal: Ability to describe self-care measures that may prevent or decrease complications (Diabetes Survival Skills Education) will improve Outcome: Progressing   Problem: Nutritional: Goal: Maintenance of adequate nutrition will improve Outcome: Progressing   Problem: Education: Goal: Knowledge of the prescribed therapeutic regimen will improve Outcome: Progressing   Problem: Activity: Goal: Ability to avoid complications of mobility impairment will improve Outcome: Progressing   Problem: Pain Management: Goal: Pain level will decrease with appropriate interventions Outcome: Progressing

## 2024-02-11 NOTE — Progress Notes (Signed)
 Patient had an episode of delirium last night, was trying to get out of the bed trying to pull telebox thinking it granite. Night oncall NP Lavanda Horns informed, got order for soft waist restraint and Haladol. Patient was waist restrained and examined overnight, Inj haldol 2mg  also given. In the morning,after confirming the orientation restraint was removed but patient not happy about being restrained at night even though he was explained about the reason of restraining him and consequences that might have occurred. Patient refusing the care and trying to leave the hospital. Wife at the bedside not able to convince patient and asking for help. Dayshift charge nurse and primary nurse inside the room trying to convince the patient.

## 2024-02-11 NOTE — Progress Notes (Signed)
  Progress Note   Patient: [name] FMW:[id number] DOB: Apr 02, 1932 DOA: 02/06/2024     4 DOS: the patient was seen and examined on 02/11/2024 at 8:25 AM      Brief hospital course: 89 y.o. M with CAD s/p PCI >5 yrs, HTN, dCHF, DM, AF not on AC due to choice, OSA not on CPAP who presented with fall, found to have right hip fracture and was in Afib.  Underwent right hemiarthroplasty on 10/27 by Dr. Kendal.  Post-op course complicated by delirium.     Assessment and Plan: * Hip fracture (HCC) Requiring 2person assist for mobility in last 48 hours, PT have upgraded to SNF recommendation. - PT/OT  CAP (community acquired pneumonia) New O2 requirement overnight, CXR shows pneumonia, reports dyspnea.   - Start Roecphin, day 3 of 5 - IS and flutter    Acute metabolic encephalopathy Delirious again this morning. - Avoid restraints - Start seroquel  Malnutrition of moderate degree - Continue nutritional supplements and MVI - Consult dietitian  Atrial fibrillation with RVR (HCC) HR controlled - Refusing Eliquis  - Metoprolol  stopped by Cardiology - Continue diltiazem  (HFpEF) heart failure with preserved ejection fraction (HCC) Appears euvolemic - Continue furosemide   Coronary artery disease involving native coronary artery of native heart with angina pectoris - Continue Lipitor - Hold Imdur  - Consult cardiology, appreciate cares  Hypertension with heart disease Blood pressure normal - Continue diltiazem,  - Hold furosemide , valsartan , Imdur   OBSTRUCTIVE SLEEP APNEA Not on CPAP  Type 2 diabetes mellitus with diabetic neuropathy, unspecified (HCC) Glucose high normal - Continue sliding scale corrections - Hold home glipizide           Subjective: Delirious overnight.  Refusing Eliquis  at discharge.  Wants to go home.  After discussion with daughter, reportedly agreeable to rehab.     Physical Exam: BP (!) 146/75 (BP Location: Left Arm)   Pulse 78    Temp 98.4 F (36.9 C) (Oral)   Resp 20   Ht (P) 5' 3 (1.6 m)   Wt (P) 65.8 kg   SpO2 97%   BMI (P) 25.69 kg/m   Elderly adult male, sitting on edge of bed, gown off, in his brief, interactive Irregular, rate normal, No murmurs, no peripheral edema Respiratory rate normal, lungs clear now Attention normal, irritable, face symmetric, speech fluent, oriented to self, hospital, wife, short term memory impaired, upper extremity strength normal     Data Reviewed: Disucssed with Cardiology and Pharmacy BMP normal CBC normal  Family Communication: Daughter by phone, wife at bedside    Disposition: Status is: Inpatient         Author: [name], MD 02/11/2024 3:52 PM  For on call review [email or web address].

## 2024-02-11 NOTE — Progress Notes (Addendum)
     Patient Name: [name]           DOB: [birth year]  MRN: [id number]      Admission Date: 02/06/2024  Attending Provider: Jonel Lonni SQUIBB, *  Primary Diagnosis: Hip fracture (HCC)   Level of care: Telemetry   OVERNIGHT EVENT  Acute metabolic encephalopathy  Hospital delirium Patient is restless and attempting to get out of bed, presenting a high fall risk.  Awoke suddenly, appearing delirious and agitated.  Wife is at bedside providing support; however, patient remains non-redirectable   Plan: Low-dose IV Haldol Waist belt restraint Delirium precautions   Lavanda Horns, DNP, ACNPC- AG [facility] [place]

## 2024-02-11 NOTE — TOC Progression Note (Signed)
 Transition of Care [facility]) - Progression Note    Patient Details  Name: [name] MRN: [id number] Date of Birth: May 28, 1931  Transition of Care [facility]) CM/SW Contact  Bridget Cordella Simmonds, LCSW Phone Number: 02/11/2024, 1:41 PM  Clinical Narrative:   CSW spoke with pt and daughter Rueben regarding new PT recommendation for SNF.  Pt initially declines, after more conversation and encouragement from daughter says he will consider SNF, permission given to send out referral in the hub.  Per daughter Wife is not able to physically assist at home, daughter cannot provide 24/7 assist.  Referral sent out in hub for SNF.    Expected Discharge Plan: Skilled Nursing Facility Barriers to Discharge: Continued Medical Work up, SNF Pending bed offer               Expected Discharge Plan and Services   Discharge Planning Services: CM Consult Post Acute Care Choice: Durable Medical Equipment, Home Health Living arrangements for the past 2 months: Single Family Home                 DME Arranged: Bedside commode         HH Arranged: PT, OT           Social Drivers of Health (SDOH) Interventions SDOH Screenings   Food Insecurity: No Food Insecurity (02/07/2024)  Housing: Low Risk  (02/07/2024)  Transportation Needs: No Transportation Needs (02/07/2024)  Utilities: Not At Risk (02/07/2024)  Alcohol Screen: Low Risk  (02/16/2022)  Depression (PHQ2-9): High Risk (02/02/2023)  Financial Resource Strain: Low Risk  (02/16/2022)  Physical Activity: Insufficiently Active (02/16/2022)  Social Connections: Moderately Isolated (02/10/2024)  Stress: Stress Concern Present (02/16/2022)  Tobacco Use: Medium Risk (02/07/2024)    Readmission Risk Interventions     No data to display

## 2024-02-11 NOTE — [place] FL2 (Signed)
 [place]  MEDICAID FL2 LEVEL OF CARE FORM     IDENTIFICATION  Patient Name: [name] Birthdate: [birth year] Sex: male Admission Date (Current Location): 02/06/2024  [facility] and [place] Number:  Producer, Television/film/video and Address:  [facility] [place], [address] [place], [place] [address]      Provider Number: [id number]  Attending Physician Name and Address:  Jonel Lonni SQUIBB, *  Relative Name and Phone Number:  Christen, Bedoya Spouse [phone]  [phone]    Current Level of Care: Hospital Recommended Level of Care: Skilled Nursing Facility Prior Approval Number:    Date Approved/Denied:   PASRR Number: [id number]  Discharge Plan: SNF    Current Diagnoses: Patient Active Problem List   Diagnosis Date Noted   Malnutrition of moderate degree 02/09/2024   Acute metabolic encephalopathy 02/09/2024   CAP (community acquired pneumonia) 02/09/2024   Hip fracture (HCC) 02/07/2024   Atrial fibrillation with RVR (HCC) 02/07/2024   Nonrheumatic aortic valve stenosis 02/07/2024   Atrial fibrillation (HCC) 08/31/2022   Urine incontinence 03/29/2018   Bilateral lower extremity edema 03/29/2018   Chronic otitis externa of both ears 03/29/2018   Forgetfulness 12/17/2017   Peripheral neuropathy 10/30/2016   Chronic fatigue 10/30/2016   Knee osteoarthritis 07/26/2016   Chronic diarrhea 10/16/2015   Murmur 08/29/2015   Sinus bradycardia 08/29/2015   Myalgia due to statin 08/29/2015   Cramp of both lower extremities 08/29/2015   Insomnia 08/12/2015   (HFpEF) heart failure with preserved ejection fraction (HCC) 07/22/2015   COPD (chronic obstructive pulmonary disease) (HCC) 07/22/2015   Spondylolisthesis of lumbar region 05/02/2013   Cervical spine disease 04/16/2013   Preop cardiovascular exam 03/02/2012   BPH with urinary obstruction 03/22/2011   Diabetes mellitus type 2 with neurological manifestations (HCC) 06/17/2010   SPINAL STENOSIS,  LUMBAR 06/03/2009   BACK PAIN, CHRONIC 04/18/2009   HEART MURMUR, SYSTOLIC 01/02/2008   Cramp of limb 10/28/2007   Depression, major, recurrent, moderate (HCC) 06/24/2007   Generalized osteoarthritis of multiple sites 06/24/2007   CARPAL TUNNEL SYNDROME 03/24/2007   HERPES SIMPLEX INFECTION 01/19/2007   Type 2 diabetes mellitus with diabetic neuropathy, unspecified (HCC) 01/19/2007   Hyperlipidemia, mixed 01/19/2007   OBSTRUCTIVE SLEEP APNEA 01/19/2007   Hypertension with heart disease 01/19/2007   Coronary artery disease involving native coronary artery of native heart with angina pectoris 01/19/2007   BPH associated with nocturia 01/19/2007   PERCUTANEOUS TRANSLUMINAL CORONARY ANGIOPLASTY, HX OF 01/19/2007    Orientation RESPIRATION BLADDER Height & Weight     Self, Time, Situation, Place  Normal Continent Weight: (P) 145 lb 0.2 oz (65.8 kg) Height:  (P) 5' 3 (160 cm)  BEHAVIORAL SYMPTOMS/MOOD NEUROLOGICAL BOWEL NUTRITION STATUS        Diet (see discharge summary)  AMBULATORY STATUS COMMUNICATION OF NEEDS Skin   Limited Assist Verbally Surgical wounds, Skin abrasions                       Personal Care Assistance Level of Assistance  Bathing, Feeding, Dressing Bathing Assistance: Maximum assistance Feeding assistance: Independent Dressing Assistance: Maximum assistance     Functional Limitations Info  Sight, Hearing, Speech Sight Info: Adequate Hearing Info: Adequate Speech Info: Adequate    SPECIAL CARE FACTORS FREQUENCY  PT (By licensed PT), OT (By licensed OT)     PT Frequency: 5x week OT Frequency: 5x week            Contractures Contractures Info: Not present  Additional Factors Info  Code Status, Allergies, Insulin  Sliding Scale Code Status Info: DNR Allergies Info: NKA   Insulin  Sliding Scale Info: Novolog : see discharge summary       Current Medications (02/11/2024):  This is the current hospital active medication list Current  Facility-Administered Medications  Medication Dose Route Frequency Provider Last Rate Last Admin   (feeding supplement) PROSource Plus liquid 30 mL  30 mL Oral BID BM Gherghe, Costin M, MD   30 mL at 02/11/24 1335   acetaminophen  (TYLENOL ) tablet 325-650 mg  325-650 mg Oral Q6H PRN Danton Lauraine LABOR, PA-C       apixaban  (ELIQUIS ) tablet 5 mg  5 mg Oral Q12H Danton Lauraine LABOR, PA-C   5 mg at 02/10/24 2115   atorvastatin  (LIPITOR) tablet 40 mg  40 mg Oral Daily Danton Lauraine LABOR, PA-C   40 mg at 02/10/24 0925   bisacodyl  (DULCOLAX) EC tablet 5 mg  5 mg Oral Daily PRN Danton Lauraine LABOR, PA-C   5 mg at 02/09/24 1955   cefTRIAXone  (ROCEPHIN ) 2 g in sodium chloride  0.9 % 100 mL IVPB  2 g Intravenous Q24H Jonel Lonni SQUIBB, MD 200 mL/hr at 02/10/24 0933 2 g at 02/10/24 9066   celecoxib (CELEBREX) capsule 200 mg  200 mg Oral BID Danton Lauraine LABOR, PA-C   200 mg at 02/11/24 1335   diltiazem (CARDIZEM CD) 24 hr capsule 240 mg  240 mg Oral Daily Barbaraann Darryle Ned, MD   240 mg at 02/11/24 1335   diphenhydrAMINE (BENADRYL) 12.5 MG/5ML elixir 12.5-25 mg  12.5-25 mg Oral Q4H PRN Danton Lauraine LABOR, PA-C       feeding supplement (ENSURE PLUS HIGH PROTEIN) liquid 237 mL  237 mL Oral BID BM Gherghe, Costin M, MD   237 mL at 02/11/24 1336   finasteride  (PROSCAR ) tablet 5 mg  5 mg Oral Daily Danton Lauraine LABOR, PA-C   5 mg at 02/11/24 1335   folic acid (FOLVITE) tablet 1 mg  1 mg Oral Daily Danton Lauraine LABOR, PA-C   1 mg at 02/11/24 1335   furosemide  (LASIX ) tablet 40 mg  40 mg Oral Daily Danford, Christopher P, MD   40 mg at 02/10/24 1839   HYDROcodone -acetaminophen  (NORCO) 7.5-325 MG per tablet 1-2 tablet  1-2 tablet Oral Q4H PRN Danton Lauraine LABOR, PA-C       HYDROcodone -acetaminophen  (NORCO/VICODIN) 5-325 MG per tablet 1-2 tablet  1-2 tablet Oral Q4H PRN Danton Lauraine LABOR, PA-C   2 tablet at 02/08/24 2111   insulin  aspart (novoLOG ) injection 0-6 Units  0-6 Units Subcutaneous Q4H Danton Lauraine LABOR, PA-C   1 Units at  02/10/24 2353   magnesium citrate solution 1 Bottle  1 Bottle Oral Once PRN Danton Lauraine LABOR, PA-C       menthol  (CEPACOL) lozenge 3 mg  1 lozenge Oral PRN Danton Lauraine LABOR, PA-C       Or   phenol (CHLORASEPTIC) mouth spray 1 spray  1 spray Mouth/Throat PRN Danton Lauraine LABOR, PA-C   1 spray at 02/09/24 9372   methocarbamol  (ROBAXIN ) tablet 500 mg  500 mg Oral Q6H PRN Danton Lauraine LABOR, PA-C   500 mg at 02/10/24 2124   Or   methocarbamol  (ROBAXIN ) injection 500 mg  500 mg Intravenous Q6H PRN Danton Lauraine LABOR, PA-C       metoCLOPramide  (REGLAN ) tablet 5-10 mg  5-10 mg Oral Q8H PRN Danton, Sarah A, PA-C       Or   metoCLOPramide  (REGLAN )  injection 5-10 mg  5-10 mg Intravenous Q8H PRN Danton Lauraine LABOR, PA-C       metoprolol  tartrate (LOPRESSOR ) injection 2.5 mg  2.5 mg Intravenous Q4H PRN Gherghe, Costin M, MD   2.5 mg at 02/08/24 0746   morphine  (PF) 2 MG/ML injection 0.5-1 mg  0.5-1 mg Intravenous Q2H PRN Danton Lauraine LABOR, PA-C       multivitamin with minerals tablet 1 tablet  1 tablet Oral Daily Danton Lauraine LABOR, PA-C   1 tablet at 02/11/24 1335   ondansetron  (ZOFRAN ) tablet 4 mg  4 mg Oral Q6H PRN Danton Lauraine LABOR, PA-C       Or   ondansetron  (ZOFRAN ) injection 4 mg  4 mg Intravenous Q6H PRN McClung, Sarah A, PA-C       polyethylene glycol (MIRALAX  / GLYCOLAX ) packet 17 g  17 g Oral Daily Danford, Lonni SQUIBB, MD   17 g at 02/10/24 1552   QUEtiapine (SEROQUEL) tablet 25 mg  25 mg Oral QHS Danford, Lonni SQUIBB, MD       senna-docusate (Senokot-S) tablet 1 tablet  1 tablet Oral QHS Jonel Lonni SQUIBB, MD   1 tablet at 02/10/24 2115   sodium chloride  flush (NS) 0.9 % injection 10-40 mL  10-40 mL Intracatheter Q12H Danford, Lonni SQUIBB, MD   10 mL at 02/10/24 2356   sodium chloride  flush (NS) 0.9 % injection 10-40 mL  10-40 mL Intracatheter PRN Danford, Lonni SQUIBB, MD       thiamine (VITAMIN B1) tablet 100 mg  100 mg Oral Daily Danton Lauraine A, PA-C   100 mg at 02/10/24 9074   Or    thiamine (VITAMIN B1) injection 100 mg  100 mg Intravenous Daily Danton Lauraine LABOR, PA-C   100 mg at 02/09/24 9066   Vitamin D (Ergocalciferol) (DRISDOL) 1.25 MG (50000 UNIT) capsule 50,000 Units  50,000 Units Oral Q7 days Gherghe, Costin M, MD   50,000 Units at 02/08/24 1725   zolpidem (AMBIEN) tablet 5 mg  5 mg Oral QHS PRN Danton Lauraine LABOR, PA-C   5 mg at 02/10/24 2124     Discharge Medications: Please see discharge summary for a list of discharge medications.  Relevant Imaging Results:  Relevant Lab Results:   Additional Information SSN: [id number]  Bridget Cordella Simmonds, LCSW

## 2024-02-11 NOTE — Progress Notes (Signed)
 Cardiology Progress Note  Patient ID: [name] MRN: [id number] DOB: [birth year] Date of Encounter: 02/11/2024 Primary Cardiologist: Redell Leiter, MD  Subjective   Chief Complaint: Delirium   HPI: Confused this morning.  A-fib well-controlled.  ROS:  All other ROS reviewed and negative. Pertinent positives noted in the HPI.     Telemetry  Overnight telemetry shows A-fib 60 to 70 bpm, which I personally reviewed.     Physical Exam   Vitals:   02/10/24 2128 02/10/24 2346 02/11/24 0332 02/11/24 0917  BP:  113/72 101/76 (!) 146/75  Pulse: 62 85 76 78  Resp: 20     Temp:  97.9 F (36.6 C) 97.7 F (36.5 C) 98.4 F (36.9 C)  TempSrc:    Oral  SpO2: 98% 95% 90% 97%  Weight:      Height:        Intake/Output Summary (Last 24 hours) at 02/11/2024 1145 Last data filed at 02/10/2024 1617 Gross per 24 hour  Intake --  Output 700 ml  Net -700 ml       02/07/2024   12:39 PM 02/06/2024   11:34 PM 09/22/2023   11:09 AM  Last 3 Weights  Weight (lbs) 145 lb 0.2 oz 145 lb 153 lb 2 oz  Weight (kg) 65.777 kg 65.772 kg 69.457 kg    Body mass index is 25.69 kg/m (pended).  General: Well nourished, well developed, in no acute distress Head: Atraumatic, normal size  Eyes: PEERLA, EOMI  Neck: Supple, no JVD Endocrine: No thryomegaly Cardiac: Normal S1, S2; irregular rhythm, 3 out of 6 holosystolic murmur Lungs: Clear to auscultation bilaterally, no wheezing, rhonchi or rales  Abd: Soft, nontender, no hepatomegaly  Ext: No edema, pulses 2+ Musculoskeletal: No deformities, BUE and BLE strength normal and equal Skin: Warm and dry, no rashes   Neuro: Alert and oriented to person, place, time, and situation, CNII-XII grossly intact, no focal deficits  Psych: Normal mood and affect   Cardiac Studies  TTE 02/07/2024  1. Severe paradoxical low flow low gradient aortic stenosis is present.  The aortic valve is tricuspid. There is severe calcifcation of the aortic  valve. There  is severe thickening of the aortic valve. Aortic valve  regurgitation is trivial. Severe aortic   valve stenosis. Aortic valve area, by VTI measures 0.85 cm. Aortic valve  mean gradient measures 15.8 mmHg. Aortic valve Vmax measures 2.65 m/s.   2. Concerns for severe mitral regurgitation. Significant splay artifact  concerning for at least moderate mitral regurgitation. Consider TEE for  clarification and quantification of mitral regurgitation. The mitral valve  is degenerative. Moderate to  severe mitral valve regurgitation. Severe mitral annular calcification.   3. Left ventricular ejection fraction, by estimation, is 45 to 50%. The  left ventricle has mildly decreased function. The left ventricle has no  regional wall motion abnormalities. There is moderate concentric left  ventricular hypertrophy. Left  ventricular diastolic function could not be evaluated.   4. Right ventricular systolic function is mildly reduced. The right  ventricular size is normal. There is normal pulmonary artery systolic  pressure. The estimated right ventricular systolic pressure is 30.0 mmHg.   5. Left atrial size was severely dilated.   6. Right atrial size was severely dilated.   7. Tricuspid valve regurgitation is mild to moderate.   8. The inferior vena cava is normal in size with greater than 50%  respiratory variability, suggesting right atrial pressure of 3 mmHg.   Patient Profile  [name] is a 89 y.o. male with permanent A-fib, HFpEF, diabetes, hypertension, CAD status post PCI, COPD, OSA admitted on 02/07/2024 with right hip fracture.  Cardiology consulted for A-fib with RVR.  Assessment & Plan   # A-fib - Rate controlled.  Stop metoprolol .  Continue diltiazem 240 mg daily.  Continue Eliquis  5 mg twice daily.  # Right hip fracture # Encephalopathy # Delirium - Per primary team.  # Heart failure with preserved ejection fraction, EF 50% - Euvolemic.  # Moderate to severe aortic  stenosis # Moderate to severe mitral valve regurgitation - Plan to follow this as an outpatient.  # HTN - hold home irbesartan  and Imdur .  Diltiazem as above.  # CAD s/p PCI - No need for aspirin.  Continue Lipitor.  [facility] will sign off.   Medication Recommendations: As above Other recommendations (labs, testing, etc): None Follow up as an outpatient: 4 to 6 weeks with Dr. Monetta  For questions or updates, please contact [facility] Please consult [email or web address] for contact info under         Signed, Darryle T. Barbaraann, MD, [facility] [place]  [facility]  02/11/2024 11:45 AM

## 2024-02-12 ENCOUNTER — Inpatient Hospital Stay (HOSPITAL_COMMUNITY)

## 2024-02-12 DIAGNOSIS — M7989 Other specified soft tissue disorders: Secondary | ICD-10-CM | POA: Diagnosis not present

## 2024-02-12 DIAGNOSIS — S72001D Fracture of unspecified part of neck of right femur, subsequent encounter for closed fracture with routine healing: Secondary | ICD-10-CM | POA: Diagnosis not present

## 2024-02-12 LAB — GLUCOSE, CAPILLARY
Glucose-Capillary: 153 mg/dL — ABNORMAL HIGH (ref 70–99)
Glucose-Capillary: 137 mg/dL — ABNORMAL HIGH (ref 70–99)
Glucose-Capillary: 207 mg/dL — ABNORMAL HIGH (ref 70–99)
Glucose-Capillary: 266 mg/dL — ABNORMAL HIGH (ref 70–99)
Glucose-Capillary: 209 mg/dL — ABNORMAL HIGH (ref 70–99)

## 2024-02-12 NOTE — Progress Notes (Signed)
  Progress Note   Patient: [name] FMW:[id number] DOB: [birth year] DOA: 02/06/2024     5 DOS: the patient was seen and examined on 02/12/2024        Brief hospital course: 89 y.o. M with CAD s/p PCI >5 yrs, HTN, dCHF, DM, AF not on AC due to choice, OSA not on CPAP who presented with fall, found to have right hip fracture and was in Afib.  Underwent right hemiarthroplasty on 10/27 by Dr. Kendal.  Post-op course complicated by delirium.     Assessment and Plan: * Hip fracture (HCC) - PT/OT  Leg swelling Right (operative) leg somewhat swollen.   US  obtaine,d negative for DVT  CAP (community acquired pneumonia) New O2 requirement overnight, CXR shows pneumonia, reports dyspnea.   - Start Roecphin, day 4 of 5 - IS and flutter as able given delirium  Acute metabolic encephalopathy - at bedtime quetiapine  Malnutrition of moderate degree - Continue nutritional supplements and MVI - Consult dietitian  Atrial fibrillation with RVR (HCC) - Refusing apixaban  - Continue diltiazem  (HFpEF) heart failure with preserved ejection fraction (HCC) - Resume Lasix   Coronary artery disease involving native coronary artery of native heart with angina pectoris - Continue Lipitor - Hold Imdur  - Consult cardiology, appreciate cares  Hypertension with heart disease Blood pressure normal - Continue diltiazem - Hold furosemide , valsartan , Imdur   OBSTRUCTIVE SLEEP APNEA Not on CPAP  Type 2 diabetes mellitus with diabetic neuropathy, unspecified (HCC) Glucose high normal - Continue sliding scale corrections - Hold home glipizide           Subjective: No new complaints, slept well, no delrlium overnight     Physical Exam: BP 128/75 (BP Location: Left Arm)   Pulse 93   Temp 97.6 F (36.4 C) (Oral)   Resp 18   Ht (P) 5' 3 (1.6 m)   Wt (P) 65.8 kg   SpO2 95%   BMI (P) 25.69 kg/m   General: Pt is alert, awake, not in acute distress Cardiovascular: RRR, nl S1-S2, no  murmurs appreciated.   No LE edema.   Respiratory: Normal respiratory rate and rhythm.  CTAB without rales or wheezes. Abdominal: Abdomen soft and non-tender.  No distension or HSM.   Neuro/Psych: Strength symmetric in upper and lower extremities.  Judgment and insight appear normal.   Data Reviewed:  BMP normal CBC slightly anemic    Family Communication: Wife and daughter    Disposition: Status is: Inpatient         Author: [name], MD 02/12/2024 6:04 PM  For on call review [email or web address]

## 2024-02-12 NOTE — TOC Progression Note (Signed)
 Transition of Care [facility]) - Progression Note    Patient Details  Name: [name] MRN: [id number] Date of Birth: 01-17-1932  Transition of Care [facility] CM/SW Contact  Bridget Cordella Simmonds, LCSW Phone Number: 02/12/2024, 11:55 AM  Clinical Narrative:   One SNF bed offer provided to pt daughter Rueben.  Pt worked with PT again yesterday, did well.  They are leaning towards DC home at this point.  Will have DME needs.   RNCM informed.     Expected Discharge Plan: Skilled Nursing Facility Barriers to Discharge: Continued Medical Work up, SNF Pending bed offer               Expected Discharge Plan and Services   Discharge Planning Services: CM Consult Post Acute Care Choice: Durable Medical Equipment, Home Health Living arrangements for the past 2 months: Single Family Home                 DME Arranged: Bedside commode         HH Arranged: PT, OT           Social Drivers of Health (SDOH) Interventions SDOH Screenings   Food Insecurity: No Food Insecurity (02/07/2024)  Housing: Low Risk  (02/07/2024)  Transportation Needs: No Transportation Needs (02/07/2024)  Utilities: Not At Risk (02/07/2024)  Alcohol Screen: Low Risk  (02/16/2022)  Depression (PHQ2-9): High Risk (02/02/2023)  Financial Resource Strain: Low Risk  (02/16/2022)  Physical Activity: Insufficiently Active (02/16/2022)  Social Connections: Moderately Isolated (02/10/2024)  Stress: Stress Concern Present (02/16/2022)  Tobacco Use: Medium Risk (02/07/2024)    Readmission Risk Interventions     No data to display

## 2024-02-12 NOTE — Progress Notes (Signed)
 VASCULAR LAB    Right lower extremity venous duplex has been performed.  See CV proc for preliminary results.   Fayetta Sorenson, RVT 02/12/2024, 11:57 AM

## 2024-02-12 NOTE — Plan of Care (Signed)
  Problem: Education: Goal: Ability to describe self-care measures that may prevent or decrease complications (Diabetes Survival Skills Education) will improve Outcome: Progressing   Problem: Coping: Goal: Ability to adjust to condition or change in health will improve Outcome: Progressing   Problem: Nutritional: Goal: Maintenance of adequate nutrition will improve Outcome: Progressing   Problem: Education: Goal: Knowledge of the prescribed therapeutic regimen will improve Outcome: Progressing   Problem: Clinical Measurements: Goal: Ability to maintain clinical measurements within normal limits will improve Outcome: Progressing

## 2024-02-13 DIAGNOSIS — S72001D Fracture of unspecified part of neck of right femur, subsequent encounter for closed fracture with routine healing: Secondary | ICD-10-CM | POA: Diagnosis not present

## 2024-02-13 DIAGNOSIS — I5032 Chronic diastolic (congestive) heart failure: Secondary | ICD-10-CM | POA: Diagnosis not present

## 2024-02-13 LAB — GLUCOSE, CAPILLARY
Glucose-Capillary: 155 mg/dL — ABNORMAL HIGH (ref 70–99)
Glucose-Capillary: 246 mg/dL — ABNORMAL HIGH (ref 70–99)
Glucose-Capillary: 194 mg/dL — ABNORMAL HIGH (ref 70–99)
Glucose-Capillary: 292 mg/dL — ABNORMAL HIGH (ref 70–99)
Glucose-Capillary: 295 mg/dL — ABNORMAL HIGH (ref 70–99)
Glucose-Capillary: 258 mg/dL — ABNORMAL HIGH (ref 70–99)

## 2024-02-13 LAB — BASIC METABOLIC PANEL WITH GFR
Anion gap: 13 (ref 5–15)
BUN: 26 mg/dL — ABNORMAL HIGH (ref 8–23)
CO2: 27 mmol/L (ref 22–32)
Calcium: 8.9 mg/dL (ref 8.9–10.3)
Chloride: 103 mmol/L (ref 98–111)
Creatinine, Ser: 0.83 mg/dL (ref 0.61–1.24)
GFR, Estimated: >60 mL/min (ref >60)
Glucose, Bld: 335 mg/dL — ABNORMAL HIGH (ref 70–99)
Potassium: 4.5 mmol/L (ref 3.5–5.1)
Sodium: 143 mmol/L (ref 135–145)

## 2024-02-13 LAB — CBC
HCT: 42.4 % (ref 39.0–52.0)
Hemoglobin: 13.8 g/dL (ref 13.0–17.0)
MCH: 30.8 pg (ref 26.0–34.0)
MCHC: 32.5 g/dL (ref 30.0–36.0)
MCV: 94.6 fL (ref 80.0–100.0)
Platelets: 242 K/uL (ref 150–400)
RBC: 4.48 MIL/uL (ref 4.22–5.81)
RDW: 13.2 % (ref 11.5–15.5)
WBC: 9.5 K/uL (ref 4.0–10.5)
nRBC: 0 % (ref 0.0–0.2)

## 2024-02-13 MED ORDER — LOPERAMIDE HCL 2 MG PO CAPS
2.0000 mg | ORAL_CAPSULE | ORAL | Status: DC | PRN
Start: 2024-02-13 — End: 2024-02-17
  Administered 2024-02-13: 2 mg via ORAL
  Filled 2024-02-13: qty 1

## 2024-02-13 MED ORDER — QUETIAPINE FUMARATE 25 MG PO TABS
12.5000 mg | ORAL_TABLET | Freq: Every day | ORAL | Status: DC
  Administered 2024-02-13 – 2024-02-14 (×2): 12.5 mg via ORAL
  Filled 2024-02-13 (×2): qty 1

## 2024-02-13 NOTE — Progress Notes (Signed)
  Progress Note   Patient: [name] FMW:[id number] DOB: Jun 16, 1931 DOA: 02/06/2024     6 DOS: the patient was seen and examined on 02/13/2024        Brief hospital course: 89 y.o. M with CAD s/p PCI >5 yrs, HTN, dCHF, DM, AF not on AC due to choice, OSA not on CPAP who presented with fall, found to have right hip fracture and was in Afib.  Underwent right hemiarthroplasty on 10/27 by Dr. Kendal.  Post-op course complicated by delirium.     Assessment and Plan: * Hip fracture (HCC) Doing well post op - WBAT - PT/OT  CAP (community acquired pneumonia) Completed 5 days antibiotics, Patient now mentating at baseline, taking orals.  Temp < 100 F, heart rate < 100bpm, RR < 24, SpO2 at baseline.     Acute metabolic encephalopathy Delirium now resolved - Continue Seroquel QHS    Atrial fibrillation with RVR (HCC) Rate controlled.  Not on apixaban  due to patient choice at home - Continue apixaban  here - Continue diltiazem  (HFpEF) heart failure with preserved ejection fraction (HCC) Appears euvolemic - Continue home furosemide   Coronary artery disease involving native coronary artery of native heart with angina pectoris -Continue atorvastatin , furosemide , metoprolol   Hypertension with heart disease Blood pressure normal - Continue diltiazem, metoprolol , furosemide  - Hold valsartan  and Imdur   OBSTRUCTIVE SLEEP APNEA Not on CPAP  Type 2 diabetes mellitus with diabetic neuropathy, unspecified (HCC) Glucose trending up - Continue sliding scale corrections - Hold home glipizide           Subjective: Patient is doing okay, he is having diarrhea today.  No chest pain, no shortness of breath, no orthopnea, no swelling.     Physical Exam: BP 122/74 (BP Location: Left Arm)   Pulse 64   Temp 98 F (36.7 C) (Oral)   Resp 16   Ht (P) 5' 3 (1.6 m)   Wt (P) 65.8 kg   SpO2 95%   BMI (P) 25.69 kg/m   Adult male, lying in bed, interactive and appropriate RRR, no  murmurs, no peripheral edema Respiratory rate normal, lungs clear without rales or wheezes Abdomen soft no tenderness palpation or guarding, no ascites or distention Attention normal, affect normal, judgment and insight appear normal    Data Reviewed: Basic metabolic panel pending CBC pending     Family Communication: Wife and daughter    Disposition: Status is: Inpatient         Author: [name], MD 02/13/2024 3:46 PM  For on call review [email or web address]

## 2024-02-13 NOTE — TOC Progression Note (Addendum)
 Transition of Care [facility]) - Progression Note    Patient Details  Name: [name] MRN: [id number] Date of Birth: 23-Oct-1931  [facility] CM/SW Contact  Isaiah Public, LCSWA Phone Number: 02/13/2024, 1:52 PM  Clinical Narrative:     CSW spoke with patients spouse and daughter at bedside with patient regarding dc plan for patient. Patient agreeable to go to SNF for short term rehab. Patient accepted SNF bed offer with [facility]. CSW LVM for Tonya with [facility] to confirm SNF bed for patient. CSW awaiting call back. CSW following to start insurance authorization for patient. CSW will continue to follow.  Update- Tanya with [facility] request for CSW tomorrow to follow up on bed availability.  Expected Discharge Plan: Skilled Nursing Facility Barriers to Discharge: Continued Medical Work up, SNF Pending bed offer               Expected Discharge Plan and Services   Discharge Planning Services: CM Consult Post Acute Care Choice: Durable Medical Equipment, Home Health Living arrangements for the past 2 months: Single Family Home                 DME Arranged: Bedside commode         HH Arranged: PT, OT           Social Drivers of Health (SDOH) Interventions SDOH Screenings   Food Insecurity: No Food Insecurity (02/07/2024)  Housing: Low Risk  (02/07/2024)  Transportation Needs: No Transportation Needs (02/07/2024)  Utilities: Not At Risk (02/07/2024)  Alcohol Screen: Low Risk  (02/16/2022)  Depression (PHQ2-9): High Risk (02/02/2023)  Financial Resource Strain: Low Risk  (02/16/2022)  Physical Activity: Insufficiently Active (02/16/2022)  Social Connections: Moderately Isolated (02/10/2024)  Stress: Stress Concern Present (02/16/2022)  Tobacco Use: Medium Risk (02/07/2024)    Readmission Risk Interventions     No data to display

## 2024-02-13 NOTE — Plan of Care (Signed)
  Problem: Education: Goal: Ability to describe self-care measures that may prevent or decrease complications (Diabetes Survival Skills Education) will improve Outcome: Progressing Goal: Individualized Educational Video(s) Outcome: Progressing   Problem: Coping: Goal: Ability to adjust to condition or change in health will improve Outcome: Progressing   Problem: Fluid Volume: Goal: Ability to maintain a balanced intake and output will improve Outcome: Progressing   Problem: Health Behavior/Discharge Planning: Goal: Ability to identify and utilize available resources and services will improve Outcome: Progressing Goal: Ability to manage health-related needs will improve Outcome: Progressing   Problem: Metabolic: Goal: Ability to maintain appropriate glucose levels will improve Outcome: Progressing   Problem: Nutritional: Goal: Maintenance of adequate nutrition will improve Outcome: Progressing Goal: Progress toward achieving an optimal weight will improve Outcome: Progressing   Problem: Skin Integrity: Goal: Risk for impaired skin integrity will decrease Outcome: Progressing   Problem: Tissue Perfusion: Goal: Adequacy of tissue perfusion will improve Outcome: Progressing   Problem: Education: Goal: Knowledge of the prescribed therapeutic regimen will improve Outcome: Progressing Goal: Understanding of discharge needs will improve Outcome: Progressing Goal: Individualized Educational Video(s) Outcome: Progressing   Problem: Activity: Goal: Ability to avoid complications of mobility impairment will improve Outcome: Progressing Goal: Ability to tolerate increased activity will improve Outcome: Progressing   Problem: Clinical Measurements: Goal: Postoperative complications will be avoided or minimized Outcome: Progressing   Problem: Pain Management: Goal: Pain level will decrease with appropriate interventions Outcome: Progressing   Problem: Skin  Integrity: Goal: Will show signs of wound healing Outcome: Progressing   Problem: Education: Goal: Knowledge of General Education information will improve Description: Including pain rating scale, medication(s)/side effects and non-pharmacologic comfort measures Outcome: Progressing   Problem: Health Behavior/Discharge Planning: Goal: Ability to manage health-related needs will improve Outcome: Progressing   Problem: Clinical Measurements: Goal: Ability to maintain clinical measurements within normal limits will improve Outcome: Progressing Goal: Will remain free from infection Outcome: Progressing Goal: Diagnostic test results will improve Outcome: Progressing Goal: Respiratory complications will improve Outcome: Progressing Goal: Cardiovascular complication will be avoided Outcome: Progressing   Problem: Activity: Goal: Risk for activity intolerance will decrease Outcome: Progressing   Problem: Nutrition: Goal: Adequate nutrition will be maintained Outcome: Progressing   Problem: Coping: Goal: Level of anxiety will decrease Outcome: Progressing   Problem: Elimination: Goal: Will not experience complications related to bowel motility Outcome: Progressing Goal: Will not experience complications related to urinary retention Outcome: Progressing   Problem: Pain Managment: Goal: General experience of comfort will improve and/or be controlled Outcome: Progressing   Problem: Safety: Goal: Ability to remain free from injury will improve Outcome: Progressing   Problem: Skin Integrity: Goal: Risk for impaired skin integrity will decrease Outcome: Progressing   Problem: Safety: Goal: Non-violent Restraint(s) Outcome: Progressing

## 2024-02-14 DIAGNOSIS — S72001D Fracture of unspecified part of neck of right femur, subsequent encounter for closed fracture with routine healing: Secondary | ICD-10-CM | POA: Diagnosis not present

## 2024-02-14 LAB — GLUCOSE, CAPILLARY
Glucose-Capillary: 160 mg/dL — ABNORMAL HIGH (ref 70–99)
Glucose-Capillary: 183 mg/dL — ABNORMAL HIGH (ref 70–99)
Glucose-Capillary: 196 mg/dL — ABNORMAL HIGH (ref 70–99)
Glucose-Capillary: 255 mg/dL — ABNORMAL HIGH (ref 70–99)
Glucose-Capillary: 255 mg/dL — ABNORMAL HIGH (ref 70–99)
Glucose-Capillary: 267 mg/dL — ABNORMAL HIGH (ref 70–99)
Glucose-Capillary: 302 mg/dL — ABNORMAL HIGH (ref 70–99)

## 2024-02-14 MED ORDER — GLIPIZIDE 5 MG PO TABS
5.0000 mg | ORAL_TABLET | Freq: Every day | ORAL | Status: DC
  Administered 2024-02-15 – 2024-02-17 (×3): 5 mg via ORAL
  Filled 2024-02-14 (×3): qty 1

## 2024-02-14 MED ORDER — ALFUZOSIN HCL ER 10 MG PO TB24
10.0000 mg | ORAL_TABLET | Freq: Every day | ORAL | Status: DC
  Administered 2024-02-15 – 2024-02-17 (×3): 10 mg via ORAL
  Filled 2024-02-14 (×3): qty 1

## 2024-02-14 MED ORDER — FUROSEMIDE 40 MG PO TABS
40.0000 mg | ORAL_TABLET | Freq: Once | ORAL | Status: AC
  Administered 2024-02-14: 40 mg via ORAL
  Filled 2024-02-14: qty 1

## 2024-02-14 NOTE — Progress Notes (Signed)
 Physical Therapy Treatment Patient Details Name: [name] MRN: [id number] DOB: 04-11-1932 Today's Date: 02/14/2024   History of Present Illness 89 y.o. male presents to [facility] hospital on 02/06/2024 after a fall. Pt found to have R hip fx, underwent R hip hemiarthroplasty on 10/27. PMH includes CAD, afib, DMII, OSA, HTN.    PT Comments  Pt eager and motivated for session and demonstrating continued progress towards acute goals. Pt demonstrating transfers and gait with RW support with grossly CGA for safety with fair recall of education from prior sessions. Pt requiring cues for safety with transfers as pt attempting to sit prior to reaching sitting surface. Continued education on importance of frequent mobilization to maximize functional mobility gains with pt verbalizing understanding. Pt continues to benefit from skilled PT services to progress toward functional mobility goals.      If plan is discharge home, recommend the following: A little help with walking and/or transfers;Assistance with cooking/housework;A lot of help with bathing/dressing/bathroom;Assist for transportation;Help with stairs or ramp for entrance;Supervision due to cognitive status   Can travel by private vehicle     Yes  Equipment Recommendations  BSC/3in1;Other (comment) (tub bench)    Recommendations for Other Services       Precautions / Restrictions Precautions Precautions: Fall Recall of Precautions/Restrictions: Intact Precaution/Restrictions Comments: no hip precautions Restrictions Weight Bearing Restrictions Per Provider Order: Yes RLE Weight Bearing Per Provider Order: Weight bearing as tolerated     Mobility  Bed Mobility Overal bed mobility: Needs Assistance             General bed mobility comments: pt seated up EOB on arrival    Transfers Overall transfer level: Needs assistance Equipment used: Rolling walker (2 wheels) Transfers: Sit to/from Stand, Bed to chair/wheelchair/BSC Sit  to Stand: Contact guard assist           General transfer comment: pt standing from EOB at lowest height with CGA for safety. Cues to turn completly around prior to coming to sit in chair at end of session    Ambulation/Gait Ambulation/Gait assistance: Contact guard assist Gait Distance (Feet): 100 Feet (x2 with standing rest) Assistive device: Rolling walker (2 wheels) Gait Pattern/deviations: Step-through pattern, Decreased stride length, Trunk flexed, Narrow base of support Gait velocity: reduced     General Gait Details: Pt ambulated with slow steps. Cues for proximity to RW, heel strike, and increase BOS to shoulder width apart. Pt manuevered within room, bathroom, and hallway well without LOB. CGA for safety.   Stairs             Wheelchair Mobility     Tilt Bed    Modified Rankin (Stroke Patients Only)       Balance Overall balance assessment: Needs assistance Sitting-balance support: No upper extremity supported, Feet supported Sitting balance-Leahy Scale: Good     Standing balance support: Bilateral upper extremity supported, Reliant on assistive device for balance Standing balance-Leahy Scale: Poor Standing balance comment: Pt dependent on RW                            Communication Communication Communication: Impaired Factors Affecting Communication: Hearing impaired  Cognition Arousal: Alert Behavior During Therapy: WFL for tasks assessed/performed   PT - Cognitive impairments: No apparent impairments                         Following commands: Intact  Cueing Cueing Techniques: Verbal cues, Tactile cues, Visual cues  Exercises Other Exercises Other Exercises: seated marching, LAQ, on R x10 ea    General Comments General comments (skin integrity, edema, etc.): VSS on RA, wife present and supportive      Pertinent Vitals/Pain Pain Assessment Pain Assessment: 0-10 Pain Score: 7  Pain Location: R hip Pain  Descriptors / Indicators: Discomfort, Grimacing, Operative site guarding, Sore Pain Intervention(s): Monitored during session, Limited activity within patient's tolerance    Home Living                          Prior Function            PT Goals (current goals can now be found in the care plan section) Acute Rehab PT Goals Patient Stated Goal: Regain independence and return Home PT Goal Formulation: With patient/family Time For Goal Achievement: 02/22/24 Progress towards PT goals: Progressing toward goals    Frequency    Min 3X/week      PT Plan      Co-evaluation              AM-PAC PT 6 Clicks Mobility   Outcome Measure  Help needed turning from your back to your side while in a flat bed without using bedrails?: A Little Help needed moving from lying on your back to sitting on the side of a flat bed without using bedrails?: A Little Help needed moving to and from a bed to a chair (including a wheelchair)?: A Little Help needed standing up from a chair using your arms (e.g., wheelchair or bedside chair)?: A Little Help needed to walk in hospital room?: A Little Help needed climbing 3-5 steps with a railing? : A Little 6 Click Score: 18    End of Session   Activity Tolerance: Patient tolerated treatment well Patient left: with call bell/phone within reach;with family/visitor present;in chair;with chair alarm set Nurse Communication: Mobility status PT Visit Diagnosis: Other abnormalities of gait and mobility (R26.89);Muscle weakness (generalized) (M62.81);Pain;Unsteadiness on feet (R26.81);Difficulty in walking, not elsewhere classified (R26.2) Pain - Right/Left: Right Pain - part of body: Hip     Time: 1243-1301 PT Time Calculation (min) (ACUTE ONLY): 18 min  Charges:    $Gait Training: 8-22 mins PT General Charges $$ ACUTE PT VISIT: 1 Visit                     Felita Bump R. PTA Acute Rehabilitation Services Office: [phone]   Therisa CHRISTELLA Boor 02/14/2024, 1:40 PM

## 2024-02-14 NOTE — Progress Notes (Signed)
  Progress Note   Patient: [name] FMW:[id number] DOB: 1931/04/26 DOA: 02/06/2024     7 DOS: the patient was seen and examined on 02/14/2024        Brief hospital course: 89 y.o. M with CAD s/p PCI >5 yrs, HTN, dCHF, DM, AF not on AC due to choice, OSA not on CPAP who presented with fall, found to have right hip fracture and was in Afib.  Underwent right hemiarthroplasty on 10/27 by Dr. Kendal.  Post-op course complicated by delirium.     Assessment and Plan: * Hip fracture (HCC) No change  CAP (community acquired pneumonia) Resolved, see note 11/2  Acute metabolic encephalopathy - Continue seroquel temporarily    Atrial fibrillation with RVR (HCC) - Continue apixaban  and dilt, stop apix at discharge given patietn preference  (HFpEF) heart failure with preserved ejection fraction (HCC) - Continue furosemide , double dose today for   Coronary artery disease involving native coronary artery of native heart with angina pectoris -Continue atorvastatin , furosemide , metoprolol   Hypertension with heart disease Blood pressure normal - Continue diltiazem, metoprolol , furosemide  - Hold valsartan  and Imdur   OBSTRUCTIVE SLEEP APNEA Not on CPAP  Type 2 diabetes mellitus with diabetic neuropathy, unspecified (HCC) Glucose labile - Continue sliding scale corrections - Resume home glipizide           Subjective: He is doing well, no new concerns, little bit of swelling, he has no orthopnea or dyspnea on exertion    Physical Exam: BP 116/74   Pulse 68   Temp 97.7 F (36.5 C) (Oral)   Resp 17   Ht (P) 5' 3 (1.6 m)   Wt (P) 65.8 kg   SpO2 94%   BMI (P) 25.69 kg/m   Adult male, lying in bed, interactive and appropriate RRR, no murmurs, there is lower extremity edema Respiratory rate normal, lungs clear without rales or wheezes Abdomen soft no tenderness palpation or guarding, no ascites or distention Attention normal, affect normal, judgment and insight appear  normal    Data Reviewed: Basic metabolic panel shows normal renal function CBC shows no leukocytosis or anemia     Family Communication: Wife      Disposition: Status is: Inpatient         Author: [name], MD 02/14/2024 5:33 PM  For on call review [email or web address].

## 2024-02-14 NOTE — Inpatient Diabetes Management (Signed)
 Inpatient Diabetes Program Recommendations  AACE/ADA: New Consensus Statement on Inpatient Glycemic Control   Target Ranges:  Prepandial:   less than 140 mg/dL      Peak postprandial:   less than 180 mg/dL (1-2 hours)      Critically ill patients:  140 - 180 mg/dL   Lab Results  Component Value Date   GLUCAP 183 (H) 02/14/2024   HGBA1C 6.8 (H) 02/07/2024    Latest Reference Range & Units 02/13/24 07:59 02/13/24 11:20 02/13/24 15:42 02/13/24 19:41 02/14/24 00:00 02/14/24 03:57 02/14/24 08:09  Glucose-Capillary 70 - 99 mg/dL 805 (H) 707 (H) 704 (H) 258 (H) 196 (H) 160 (H) 183 (H)   Review of Glycemic Control  Diabetes history: DM2  Outpatient Diabetes medications:  Glipizide  5mg  daily   Current orders for Inpatient glycemic control:  Novolog  0-6 units q4hrs   Inpatient Diabetes Program Recommendations:   Please consider changing diet order to Carb modified and starting Novolog  3 units TID with meals (if pt consumes atleast 50% of meals).   Thanks,  Lavanda Search, RN, MSN, [facility]  Inpatient Diabetes Coordinator  Pager [phone] (8a-5p)

## 2024-02-14 NOTE — Plan of Care (Signed)
  Problem: Education: Goal: Ability to describe self-care measures that may prevent or decrease complications (Diabetes Survival Skills Education) will improve Outcome: Progressing Goal: Individualized Educational Video(s) Outcome: Progressing   Problem: Coping: Goal: Ability to adjust to condition or change in health will improve Outcome: Progressing   Problem: Fluid Volume: Goal: Ability to maintain a balanced intake and output will improve Outcome: Progressing   Problem: Health Behavior/Discharge Planning: Goal: Ability to identify and utilize available resources and services will improve Outcome: Progressing Goal: Ability to manage health-related needs will improve Outcome: Progressing   Problem: Metabolic: Goal: Ability to maintain appropriate glucose levels will improve Outcome: Progressing   Problem: Nutritional: Goal: Maintenance of adequate nutrition will improve Outcome: Progressing Goal: Progress toward achieving an optimal weight will improve Outcome: Progressing   Problem: Skin Integrity: Goal: Risk for impaired skin integrity will decrease Outcome: Progressing   Problem: Tissue Perfusion: Goal: Adequacy of tissue perfusion will improve Outcome: Progressing   Problem: Education: Goal: Knowledge of the prescribed therapeutic regimen will improve Outcome: Progressing Goal: Understanding of discharge needs will improve Outcome: Progressing Goal: Individualized Educational Video(s) Outcome: Progressing   Problem: Activity: Goal: Ability to avoid complications of mobility impairment will improve Outcome: Progressing Goal: Ability to tolerate increased activity will improve Outcome: Progressing   Problem: Clinical Measurements: Goal: Postoperative complications will be avoided or minimized Outcome: Progressing   Problem: Pain Management: Goal: Pain level will decrease with appropriate interventions Outcome: Progressing   Problem: Skin  Integrity: Goal: Will show signs of wound healing Outcome: Progressing   Problem: Education: Goal: Knowledge of General Education information will improve Description: Including pain rating scale, medication(s)/side effects and non-pharmacologic comfort measures Outcome: Progressing   Problem: Health Behavior/Discharge Planning: Goal: Ability to manage health-related needs will improve Outcome: Progressing   Problem: Clinical Measurements: Goal: Ability to maintain clinical measurements within normal limits will improve Outcome: Progressing Goal: Will remain free from infection Outcome: Progressing Goal: Diagnostic test results will improve Outcome: Progressing Goal: Respiratory complications will improve Outcome: Progressing Goal: Cardiovascular complication will be avoided Outcome: Progressing   Problem: Activity: Goal: Risk for activity intolerance will decrease Outcome: Progressing   Problem: Nutrition: Goal: Adequate nutrition will be maintained Outcome: Progressing   Problem: Coping: Goal: Level of anxiety will decrease Outcome: Progressing   Problem: Elimination: Goal: Will not experience complications related to bowel motility Outcome: Progressing Goal: Will not experience complications related to urinary retention Outcome: Progressing   Problem: Pain Managment: Goal: General experience of comfort will improve and/or be controlled Outcome: Progressing   Problem: Safety: Goal: Ability to remain free from injury will improve Outcome: Progressing   Problem: Skin Integrity: Goal: Risk for impaired skin integrity will decrease Outcome: Progressing   Problem: Safety: Goal: Non-violent Restraint(s) Outcome: Progressing

## 2024-02-14 NOTE — TOC Progression Note (Signed)
 Transition of Care [facility]) - Progression Note    Patient Details  Name: [name] MRN: [id number] Date of Birth: Sep 04, 1931  [facility]) CM/SW Contact  Bridget Cordella Simmonds, LCSW Phone Number: 02/14/2024, 9:54 AM  Clinical Narrative:   CSW spoke with pt and his wife [name]: they do want to DC to SNF.  Angela/CMA to submit [facility]arrows request.  CSW message with [facility]: not sure yet if they will have available bed.     Expected Discharge Plan: Skilled Nursing Facility Barriers to Discharge: Continued Medical Work up, SNF Pending bed offer               Expected Discharge Plan and Services   Discharge Planning Services: CM Consult Post Acute Care Choice: Durable Medical Equipment, Home Health Living arrangements for the past 2 months: Single Family Home                 DME Arranged: Bedside commode         HH Arranged: PT, OT           Social Drivers of Health (SDOH) Interventions SDOH Screenings   Food Insecurity: No Food Insecurity (02/07/2024)  Housing: Low Risk  (02/07/2024)  Transportation Needs: No Transportation Needs (02/07/2024)  Utilities: Not At Risk (02/07/2024)  Alcohol Screen: Low Risk  (02/16/2022)  Depression (PHQ2-9): High Risk (02/02/2023)  Financial Resource Strain: Low Risk  (02/16/2022)  Physical Activity: Insufficiently Active (02/16/2022)  Social Connections: Moderately Isolated (02/10/2024)  Stress: Stress Concern Present (02/16/2022)  Tobacco Use: Medium Risk (02/07/2024)    Readmission Risk Interventions     No data to display

## 2024-02-15 DIAGNOSIS — I5032 Chronic diastolic (congestive) heart failure: Secondary | ICD-10-CM | POA: Diagnosis not present

## 2024-02-15 DIAGNOSIS — S72001D Fracture of unspecified part of neck of right femur, subsequent encounter for closed fracture with routine healing: Secondary | ICD-10-CM | POA: Diagnosis not present

## 2024-02-15 LAB — GLUCOSE, CAPILLARY
Glucose-Capillary: 162 mg/dL — ABNORMAL HIGH (ref 70–99)
Glucose-Capillary: 204 mg/dL — ABNORMAL HIGH (ref 70–99)
Glucose-Capillary: 239 mg/dL — ABNORMAL HIGH (ref 70–99)
Glucose-Capillary: 250 mg/dL — ABNORMAL HIGH (ref 70–99)
Glucose-Capillary: 276 mg/dL — ABNORMAL HIGH (ref 70–99)

## 2024-02-15 MED ORDER — QUETIAPINE FUMARATE 25 MG PO TABS
25.0000 mg | ORAL_TABLET | Freq: Every day | ORAL | Status: DC
  Administered 2024-02-15 – 2024-02-16 (×2): 25 mg via ORAL
  Filled 2024-02-15 (×2): qty 1

## 2024-02-15 NOTE — TOC Progression Note (Addendum)
 Transition of Care [facility]) - Progression Note    Patient Details  Name: [name] MRN: [id number] Date of Birth: 1932-02-12  Transition of Care [facility]) CM/SW Contact  Bridget Cordella Simmonds, LCSW Phone Number: 02/15/2024, 8:45 AM  Clinical Narrative:   [facility] SN[facility] auth request remains pending.   Per [facility]: no bed available today.   Expected Discharge Plan: Skilled Nursing Facility Barriers to Discharge: Continued Medical Work up, SNF Pending bed offer               Expected Discharge Plan and Services   Discharge Planning Services: CM Consult Post Acute Care Choice: Durable Medical Equipment, Home Health Living arrangements for the past 2 months: Single Family Home                 DME Arranged: Bedside commode         HH Arranged: PT, OT           Social Drivers of Health (SDOH) Interventions SDOH Screenings   Food Insecurity: No Food Insecurity (02/07/2024)  Housing: Low Risk  (02/07/2024)  Transportation Needs: No Transportation Needs (02/07/2024)  Utilities: Not At Risk (02/07/2024)  Alcohol Screen: Low Risk  (02/16/2022)  Depression (PHQ2-9): High Risk (02/02/2023)  Financial Resource Strain: Low Risk  (02/16/2022)  Physical Activity: Insufficiently Active (02/16/2022)  Social Connections: Moderately Isolated (02/10/2024)  Stress: Stress Concern Present (02/16/2022)  Tobacco Use: Medium Risk (02/07/2024)    Readmission Risk Interventions     No data to display

## 2024-02-15 NOTE — Progress Notes (Signed)
 Progress Note   Patient: [name] FMW:[id number] DOB: 24-Apr-1931 DOA: 02/06/2024     8 DOS: the patient was seen and examined on 02/15/2024 at 9:12AM      Brief hospital course: 89 y.o. M with CAD s/p PCI >5 yrs, HTN, dCHF, DM, AF not on AC due to choice, OSA not on CPAP who presented with fall, found to have right hip fracture and was in Afib.  Underwent right hemiarthroplasty on 10/27 by Dr. Kendal.  Post-op course complicated by delirium.     Assessment and Plan: * Hip fracture (HCC) S/p R hemiarthropasty on 10/27 by Dr. Kendal Postop course complicated by pneumonia and atrial fibrillation.  Completed antibiotics for pneumonia, atrial fibrillation controlled with diltiazem.  He had some right leg swelling, ultrasound showed no evidence of DVT. - WBAT - PT/OT     CAP (community acquired pneumonia) Developed new oxygen requirement and tachypnea on 10/28.  Completed 5 days antibiotics.  Resolved.  Acute metabolic encephalopathy Patient developed agitated delirium postoperatively.  Nightly Seroquel was started and this improved. -Continue Seroquel for the next week, then taper off and stop  Malnutrition of moderate degree - Continue nutritional supplements and MVI - Consult dietitian  Atrial fibrillation with RVR (HCC) Patient has longstanding A-fib.  He has been counseled to take apixaban , but he chooses not to.  Cardiology were consulted during this admission for cardiac clearance, and they titrated his rate control medications.  Rate currently controlled. - Continue diltiazem  (HFpEF) heart failure with preserved ejection fraction (HCC) Appears euvolemic - Continue home furosemide   Coronary artery disease involving native coronary artery of native heart with angina pectoris - Continue Lipitor - Hold Imdur  - Consult cardiology, appreciate cares  Hypertension with heart disease Blood pressure controlled -Continue diltiazem and furosemide  - Hold valsartan  and  Imdur    OBSTRUCTIVE SLEEP APNEA Not on CPAP  Type 2 diabetes mellitus with diabetic neuropathy, unspecified (HCC) He has had some persistently high glucose - Continue sliding scale corrections - Home glipizide  has been resumed           Subjective: The patient is doing well, he did not sleep very well last night, but otherwise he is doing well Progressing towards rehab     Physical Exam: BP 126/66 (BP Location: Left Arm)   Pulse 64   Temp 97.9 F (36.6 C)   Resp 18   Ht 5' 3 (1.6 m)   Wt 66.9 kg   SpO2 99%   BMI 26.13 kg/m   Elderly adult male, lying in bed, interactive and appropriate Heart rate controlled, rhythm irregular, no murmurs, trace pretibial edema Respiratory rate normal, lungs clear without rales or wheezes Abdomen soft no tenderness palpation or guarding, no ascites or distention Tension normal, affect normal, judgment and insight appear at baseline, face symmetric, speech fluent      Data Reviewed: Telemetry reviewed, atrial fibrillation, rate mostly controlled   Family Communication: Wife at the bedside    Disposition: Status is: Inpatient Patient was admitted for right hip fracture, he underwent repair.  To the best of my knowledge, the above diagnoses best describe the patient's illness, and all expected diagnostic testing that requires acute inpatient care has been completed.  At this point, the patient is medically ready to transition to an outpatient treatment plan, but because of their diminished functional status from baseline, age, and recent pneumonia and recent delirium (now both resolved), it is my medical opinion that discharge home at this time would pose  a high risk unnecessary harm.  Safe disposition is being sought, and the patient will be ready to transition to that setting as soon as arranged.         Author: [name], MD 02/15/2024 5:01 PM  For on call review [email or web address].

## 2024-02-15 NOTE — Progress Notes (Signed)
 Nutrition Follow-up  DOCUMENTATION CODES:   Non-severe (moderate) malnutrition in context of chronic illness  INTERVENTION:  Encourage po intake - currently on regular diet  Emphasis on protein for wound healing Room service with assist  Change diet back to carb modified now that PO is improved  Ensure Plus High Protein po BID, each supplement provides 350 kcal and 20 grams of protein Prosource Plus 30 ml BID, each supplement provides 100 kcal and 15 gm protein Continue MVI with minerals, Thiamine, and folic acid supplementation  Continue 50,000 international units Vitamin D orally once weekly     NUTRITION DIAGNOSIS:   Moderate Malnutrition related to chronic illness as evidenced by energy intake < or equal to 75% for > or equal to 1 month, moderate muscle depletion, mild muscle depletion, percent weight loss (14 lbs, 9% weight loss in 7 months). - ongoing    GOAL:   Patient will meet greater than or equal to 90% of their needs - met    MONITOR:   PO intake, Supplement acceptance, Labs, Weight trends  REASON FOR ASSESSMENT:   Consult Assessment of nutrition requirement/status, Hip fracture protocol  ASSESSMENT:  89 y.o Male who lost his balance and fell, found to have right hip fx on imaging, s/p hip hemi 10/27. PMH of Afib, HFpEF, COPD, T2DM, HLD, depression, HTN, CAD, OSA, spinal stenosis.  10/26 - Admitted 10/27 - s/p Hip Hemi   Pt seen in room, spouse at bedside. Pt reports PO intake has been improving improving over the past few days. Pt unsure if he was brought an ensure today but has been drinking them when brought. Prosource plus packet at bedside, pt reports he has not tried the supplement yet and was encouraged to try to help with protein intake. CBG running high over the past 24 hrs, > 180. Discussed with patient changing diet back to carb modified now that his PO intake has been improving. RD assisted in ordering pt's dinner for tonight. Discharge to SNF once  stable.   Admit weight: 65.8 kg ? Stated? Current weight:  66.9 kg  Wt Readings from Last 3 Encounters:  02/15/24 66.9 kg  09/22/23 69.5 kg  10/28/22 70.5 kg    Average Meal Intake: 100% x 2 meals   Nutritionally Relevant Medications: folic acid, lasix  40 mg PO,  SSI 0-6 units q 4 hrs, MVI w/minerals, 100 mg thiamine, Vit D weekly    Labs Reviewed: CBG 162-302    NUTRITION - FOCUSED PHYSICAL EXAM:  Flowsheet Row Most Recent Value  Orbital Region Mild depletion  Upper Arm Region Severe depletion  Thoracic and Lumbar Region No depletion  Buccal Region Mild depletion  Temple Region Moderate depletion  Clavicle Bone Region Mild depletion  Clavicle and Acromion Bone Region Mild depletion  Scapular Bone Region Mild depletion  Dorsal Hand Moderate depletion  Patellar Region Moderate depletion  Anterior Thigh Region Moderate depletion  Posterior Calf Region Moderate depletion  Edema (RD Assessment) None  Hair Reviewed  Eyes Reviewed  Mouth Reviewed  Skin Reviewed  Nails Reviewed    Diet Order:   Diet Order             Diet Carb Modified Fluid consistency: Thin; Room service appropriate? Yes with Assist  Diet effective now                   EDUCATION NEEDS:   Education needs have been addressed  Skin:  Skin Assessment: Skin Integrity Issues: Skin Integrity Issues:: Incisions  Incisions: Closed surgical incision right hip  Last BM:  11/02  Height:   Ht Readings from Last 1 Encounters:  02/15/24 5' 3 (1.6 m)    Weight:   Wt Readings from Last 1 Encounters:  02/15/24 66.9 kg    Ideal Body Weight:  56.4 kg  BMI:  Body mass index is 26.13 kg/m.  Estimated Nutritional Needs:   Kcal:  1700-2000 kcal  Protein:  80-100 gm  Fluid:  >1.7L/day   Roya Gieselman, MS, RD, LDN Clinical Dietitian  Contact via secure chat. If unavailable, use group chat RD Inpatient.

## 2024-02-15 NOTE — Progress Notes (Signed)
 Occupational Therapy Treatment Patient Details Name: [name] MRN: [id number] DOB: 1931/06/25 Today's Date: 02/15/2024   History of present illness 89 y.o. male presents to [facility] hospital on 02/06/2024 after a fall. Pt found to have R hip fx, underwent R hip hemiarthroplasty on 10/27. PMH includes CAD, afib, DMII, OSA, HTN.   OT comments  Pt presented in bed and wife present in session. At this time required CGA for bed mobility, CGA to min assist for sit to stand transfers, ambulated with RW to complete ADLS in bathroom with CGA. He completed toileting tasks with CGA and cues on sequence to complete donning/doffing adult brief. Then agreed to complete UE ADLS in standing with CGA and LE dressing/bathing with max assist in sitting position. Patient will benefit from continued inpatient follow up therapy, <3 hours/day.      If plan is discharge home, recommend the following:  A little help with walking and/or transfers;A lot of help with bathing/dressing/bathroom;Assistance with cooking/housework;Assist for transportation;Help with stairs or ramp for entrance   Equipment Recommendations  Tub/shower seat;Tub/shower bench    Recommendations for Other Services      Precautions / Restrictions Precautions Precautions: Fall Recall of Precautions/Restrictions: Intact Precaution/Restrictions Comments: no hip precautions Restrictions Weight Bearing Restrictions Per Provider Order: Yes RLE Weight Bearing Per Provider Order: Weight bearing as tolerated       Mobility Bed Mobility Overal bed mobility: Needs Assistance Bed Mobility: Supine to Sit Rolling: Contact guard assist   Supine to sit: Contact guard     General bed mobility comments: cues on positioning    Transfers Overall transfer level: Needs assistance Equipment used: Rolling walker (2 wheels) Transfers: Sit to/from Stand Sit to Stand: Contact guard assist, Min assist           General transfer comment: min from  lower surfaces     Balance Overall balance assessment: Needs assistance Sitting-balance support: Feet supported Sitting balance-Leahy Scale: Good     Standing balance support: Single extremity supported, Bilateral upper extremity supported Standing balance-Leahy Scale: Fair Standing balance comment: Pt dependent on RW                           ADL either performed or assessed with clinical judgement   ADL Overall ADL's : Needs assistance/impaired Eating/Feeding: Independent;Sitting   Grooming: Wash/dry hands;Wash/dry face;Contact guard assist;Standing   Upper Body Bathing: Contact guard assist;Standing;Minimal assistance   Lower Body Bathing: Maximal assistance;Sit to/from stand   Upper Body Dressing : Contact guard assist;Sitting   Lower Body Dressing: Maximal assistance;Sit to/from stand   Toilet Transfer: Contact guard assist;Rolling walker (2 wheels);Cueing for safety;Cueing for sequencing   Toileting- Clothing Manipulation and Hygiene: Moderate assistance;Sit to/from stand       Functional mobility during ADLs: Contact guard assist;Rolling walker (2 wheels);Cueing for sequencing;Cueing for safety      Extremity/Trunk Assessment Upper Extremity Assessment Upper Extremity Assessment: Generalized weakness   Lower Extremity Assessment Lower Extremity Assessment: Defer to PT evaluation        Vision       Perception     Praxis     Communication Communication Communication: Impaired Factors Affecting Communication: Hearing impaired   Cognition Arousal: Alert Behavior During Therapy: WFL for tasks assessed/performed Cognition: No apparent impairments                               Following commands: Intact  Cueing   Cueing Techniques: Verbal cues, Tactile cues, Visual cues  Exercises      Shoulder Instructions       General Comments      Pertinent Vitals/ Pain       Pain Assessment Pain Assessment:  Faces Faces Pain Scale: Hurts a little bit Facial Expression: Relaxed, neutral Body Movements: Protection Muscle Tension: Relaxed Compliance with ventilator (intubated pts.): N/A Vocalization (extubated pts.): N/A CPOT Total: 1 Pain Location: R hip Pain Descriptors / Indicators: Discomfort, Grimacing, Operative site guarding, Sore Pain Intervention(s): Limited activity within patient's tolerance, Monitored during session, Repositioned  Home Living                                          Prior Functioning/Environment              Frequency  Min 2X/week        Progress Toward Goals  OT Goals(current goals can now be found in the care plan section)  Progress towards OT goals: Progressing toward goals  Acute Rehab OT Goals Patient Stated Goal: to go to rehab OT Goal Formulation: With patient Time For Goal Achievement: 02/24/24 Potential to Achieve Goals: Fair ADL Goals Pt Will Perform Upper Body Bathing: with modified independence;sitting Pt Will Perform Lower Body Bathing: with modified independence;with adaptive equipment;sit to/from stand Pt Will Perform Upper Body Dressing: with modified independence;sitting Pt Will Perform Lower Body Dressing: with modified independence;sit to/from stand Pt Will Transfer to Toilet: with modified independence  Plan      Co-evaluation                 AM-PAC OT 6 Clicks Daily Activity     Outcome Measure   Help from another person eating meals?: None   Help from another person toileting, which includes using toliet, bedpan, or urinal?: A Little Help from another person bathing (including washing, rinsing, drying)?: A Lot Help from another person to put on and taking off regular upper body clothing?: A Little Help from another person to put on and taking off regular lower body clothing?: A Lot 6 Click Score: 14    End of Session Equipment Utilized During Treatment: Gait belt;Rolling walker (2  wheels)  OT Visit Diagnosis: Unsteadiness on feet (R26.81);Other abnormalities of gait and mobility (R26.89);Repeated falls (R29.6);Muscle weakness (generalized) (M62.81);Pain Pain - Right/Left: Right Pain - part of body: Hip   Activity Tolerance Patient tolerated treatment well   Patient Left in chair;with call bell/phone within reach;with chair alarm set;with family/visitor present (wife)   Nurse Communication Mobility status        Time: 9054-8979 OT Time Calculation (min): 35 min  Charges: OT General Charges $OT Visit: 1 Visit OT Treatments $Self Care/Home Management : 23-37 mins  [name]  Acute Rehab Services  [phone] office number   [name] 02/15/2024, 10:26 AM

## 2024-02-15 NOTE — Progress Notes (Signed)
 Mobility Specialist Progress Note:   02/15/24 1533  Mobility  Activity Ambulated with assistance (In hallway)  Level of Assistance Contact guard assist, steadying assist  Assistive Device Front wheel walker  Distance Ambulated (ft) 125 ft  RLE Weight Bearing Per Provider Order WBAT  Activity Response Tolerated well  Mobility Referral Yes  Mobility visit 1 Mobility  Mobility Specialist Start Time (ACUTE ONLY) 1517  Mobility Specialist Stop Time (ACUTE ONLY) 1530  Mobility Specialist Time Calculation (min) (ACUTE ONLY) 13 min   Received pt in chair and agreeable to mobility. Pt required MinG for safety. No c/o. Returned to room without fault. Left pt in chair. Personal belongings and call light within reach. All needs met.  Lavanda Pollack Mobility Specialist  Please contact via [employer] or  Rehab Office [phone]

## 2024-02-16 DIAGNOSIS — S72001D Fracture of unspecified part of neck of right femur, subsequent encounter for closed fracture with routine healing: Secondary | ICD-10-CM | POA: Diagnosis not present

## 2024-02-16 LAB — GLUCOSE, CAPILLARY
Glucose-Capillary: 247 mg/dL — ABNORMAL HIGH (ref 70–99)
Glucose-Capillary: 157 mg/dL — ABNORMAL HIGH (ref 70–99)
Glucose-Capillary: 191 mg/dL — ABNORMAL HIGH (ref 70–99)
Glucose-Capillary: 305 mg/dL — ABNORMAL HIGH (ref 70–99)
Glucose-Capillary: 231 mg/dL — ABNORMAL HIGH (ref 70–99)
Glucose-Capillary: 219 mg/dL — ABNORMAL HIGH (ref 70–99)

## 2024-02-16 MED ORDER — HYDROCODONE-ACETAMINOPHEN 5-325 MG PO TABS: 1.0000 | ORAL_TABLET | ORAL | 0 refills | Status: DC | PRN

## 2024-02-16 NOTE — Plan of Care (Signed)
 Pt is alert and oriented x 4. Up 1 assist with walker. Edema to RLE. Elevated and SCD applied. Pt and wife educated on use of SCD. Vitals stable. BS every 4 hours remains over 200 even with sliding scale. No prns given.  Problem: Education: Goal: Ability to describe self-care measures that may prevent or decrease complications (Diabetes Survival Skills Education) will improve Outcome: Progressing   Problem: Coping: Goal: Ability to adjust to condition or change in health will improve Outcome: Progressing   Problem: Fluid Volume: Goal: Ability to maintain a balanced intake and output will improve Outcome: Progressing   Problem: Health Behavior/Discharge Planning: Goal: Ability to identify and utilize available resources and services will improve Outcome: Progressing Goal: Ability to manage health-related needs will improve Outcome: Progressing   Problem: Metabolic: Goal: Ability to maintain appropriate glucose levels will improve Outcome: Progressing   Problem: Nutritional: Goal: Maintenance of adequate nutrition will improve Outcome: Progressing Goal: Progress toward achieving an optimal weight will improve Outcome: Progressing   Problem: Skin Integrity: Goal: Risk for impaired skin integrity will decrease Outcome: Progressing   Problem: Tissue Perfusion: Goal: Adequacy of tissue perfusion will improve Outcome: Progressing   Problem: Education: Goal: Knowledge of the prescribed therapeutic regimen will improve Outcome: Progressing Goal: Understanding of discharge needs will improve Outcome: Progressing   Problem: Activity: Goal: Ability to avoid complications of mobility impairment will improve Outcome: Progressing Goal: Ability to tolerate increased activity will improve Outcome: Progressing   Problem: Clinical Measurements: Goal: Postoperative complications will be avoided or minimized Outcome: Progressing   Problem: Pain Management: Goal: Pain level will  decrease with appropriate interventions Outcome: Progressing   Problem: Skin Integrity: Goal: Will show signs of wound healing Outcome: Progressing   Problem: Education: Goal: Knowledge of General Education information will improve Description: Including pain rating scale, medication(s)/side effects and non-pharmacologic comfort measures Outcome: Progressing   Problem: Health Behavior/Discharge Planning: Goal: Ability to manage health-related needs will improve Outcome: Progressing   Problem: Clinical Measurements: Goal: Ability to maintain clinical measurements within normal limits will improve Outcome: Progressing Goal: Will remain free from infection Outcome: Progressing Goal: Diagnostic test results will improve Outcome: Progressing Goal: Respiratory complications will improve Outcome: Progressing Goal: Cardiovascular complication will be avoided Outcome: Progressing   Problem: Activity: Goal: Risk for activity intolerance will decrease Outcome: Progressing   Problem: Nutrition: Goal: Adequate nutrition will be maintained Outcome: Progressing   Problem: Coping: Goal: Level of anxiety will decrease Outcome: Progressing   Problem: Elimination: Goal: Will not experience complications related to bowel motility Outcome: Progressing Goal: Will not experience complications related to urinary retention Outcome: Progressing   Problem: Pain Managment: Goal: General experience of comfort will improve and/or be controlled Outcome: Progressing   Problem: Safety: Goal: Ability to remain free from injury will improve Outcome: Progressing   Problem: Skin Integrity: Goal: Risk for impaired skin integrity will decrease Outcome: Progressing

## 2024-02-16 NOTE — Progress Notes (Signed)
 Physical Therapy Treatment Patient Details Name: [name] MRN: [id number] DOB: May 20, 1931 Today's Date: 02/16/2024   History of Present Illness 89 y.o. male presents to [facility] hospital on 02/06/2024 after a fall. Pt found to have R hip fx, underwent R hip hemiarthroplasty on 10/27. PMH includes CAD, afib, DMII, OSA, HTN.    PT Comments  Pt resting in bed on arrival, pleasant and agreeable to session and demonstrating continued progress towards acute goals. Pt performing bed mobility, transfers and gait with RW for support with grossly CGA for safety with pt requiring min A to stand from low surfaces without armrests. Pt demonstrating ascent/descent of x2 stairs with adequate LE clearance and without LOB. Pt performing standing LE therex for increased ROM. Pt was educated on continued walker use to maximize functional independence, safety, and decrease risk for falls. Pt continues to benefit from skilled PT services to progress toward functional mobility goals.      If plan is discharge home, recommend the following: A little help with walking and/or transfers;Assistance with cooking/housework;A lot of help with bathing/dressing/bathroom;Assist for transportation;Help with stairs or ramp for entrance;Supervision due to cognitive status   Can travel by private vehicle     Yes  Equipment Recommendations  BSC/3in1;Other (comment) (tub bench)    Recommendations for Other Services       Precautions / Restrictions Precautions Precautions: Fall Recall of Precautions/Restrictions: Intact Precaution/Restrictions Comments: no hip precautions Restrictions Weight Bearing Restrictions Per Provider Order: Yes RLE Weight Bearing Per Provider Order: Weight bearing as tolerated     Mobility  Bed Mobility Overal bed mobility: Needs Assistance Bed Mobility: Supine to Sit     Supine to sit: Contact guard     General bed mobility comments: CGA for safety    Transfers Overall transfer level:  Needs assistance Equipment used: Rolling walker (2 wheels) Transfers: Sit to/from Stand Sit to Stand: Contact guard assist, Min assist           General transfer comment: CGA from EOB and SBC over commode, min A from standard chair without armrests    Ambulation/Gait Ambulation/Gait assistance: Contact guard assist Gait Distance (Feet): 120 Feet (x2 with seated rest) Assistive device: Rolling walker (2 wheels) Gait Pattern/deviations: Step-through pattern, Decreased stride length, Trunk flexed, Narrow base of support Gait velocity: reduced     General Gait Details: Pt ambulated with slow steps. Cues for proximity to RW, heel strike, and increase BOS to shoulder width apart. CGA for safety, no overt LOB   Stairs Stairs: Yes Stairs assistance: Contact guard assist Stair Management: Step to pattern, Forwards, Two rails Number of Stairs: 2 General stair comments: demonstration and education provided on seuqncing with pt demonstrating good recall, BUE support on rails and adequate clearance on each step   Wheelchair Mobility     Tilt Bed    Modified Rankin (Stroke Patients Only)       Balance Overall balance assessment: Needs assistance Sitting-balance support: Feet supported Sitting balance-Leahy Scale: Good Sitting balance - Comments: lateral leaning   Standing balance support: Single extremity supported, Bilateral upper extremity supported Standing balance-Leahy Scale: Fair Standing balance comment: Pt dependent on RW                            Communication Communication Communication: Impaired Factors Affecting Communication: Hearing impaired  Cognition Arousal: Alert Behavior During Therapy: WFL for tasks assessed/performed   PT - Cognitive impairments: No apparent impairments  Following commands: Intact      Cueing Cueing Techniques: Verbal cues, Tactile cues, Visual cues  Exercises Other Exercises Other  Exercises: standing marching RLE x10    General Comments General comments (skin integrity, edema, etc.): VSS on RA, pt wife present and supportice      Pertinent Vitals/Pain Pain Assessment Pain Assessment: Faces Faces Pain Scale: Hurts a little bit Pain Location: R hip Pain Descriptors / Indicators: Discomfort, Grimacing, Operative site guarding, Sore Pain Intervention(s): Monitored during session, Limited activity within patient's tolerance, Repositioned    Home Living                          Prior Function            PT Goals (current goals can now be found in the care plan section) Acute Rehab PT Goals Patient Stated Goal: Regain independence and return Home PT Goal Formulation: With patient/family Time For Goal Achievement: 02/22/24 Progress towards PT goals: Progressing toward goals    Frequency    Min 3X/week      PT Plan      Co-evaluation              AM-PAC PT 6 Clicks Mobility   Outcome Measure  Help needed turning from your back to your side while in a flat bed without using bedrails?: A Little Help needed moving from lying on your back to sitting on the side of a flat bed without using bedrails?: A Little Help needed moving to and from a bed to a chair (including a wheelchair)?: A Little Help needed standing up from a chair using your arms (e.g., wheelchair or bedside chair)?: A Little Help needed to walk in hospital room?: A Little Help needed climbing 3-5 steps with a railing? : A Little 6 Click Score: 18    End of Session   Activity Tolerance: Patient tolerated treatment well Patient left: with call bell/phone within reach;with family/visitor present;in chair Nurse Communication: Mobility status PT Visit Diagnosis: Other abnormalities of gait and mobility (R26.89);Muscle weakness (generalized) (M62.81);Pain;Unsteadiness on feet (R26.81);Difficulty in walking, not elsewhere classified (R26.2) Pain - Right/Left: Right Pain -  part of body: Hip     Time: 9140-9077 PT Time Calculation (min) (ACUTE ONLY): 23 min  Charges:    $Gait Training: 23-37 mins PT General Charges $$ ACUTE PT VISIT: 1 Visit                     [name] R. PTA Acute Rehabilitation Services Office: [phone]   [name] 02/16/2024, 9:29 AM

## 2024-02-16 NOTE — Progress Notes (Signed)
 Mobility Specialist Progress Note:    02/16/24 1153  Mobility  Activity Ambulated with assistance  Level of Assistance Contact guard assist, steadying assist  Assistive Device Front wheel walker  Distance Ambulated (ft) 120 ft  RLE Weight Bearing Per Provider Order WBAT  Activity Response Tolerated well  Mobility Referral Yes  Mobility visit 1 Mobility  Mobility Specialist Start Time (ACUTE ONLY) 1053  Mobility Specialist Stop Time (ACUTE ONLY) 1108  Mobility Specialist Time Calculation (min) (ACUTE ONLY) 15 min   Received pt in bed having no complaints and agreeable to mobility. Pt was asymptomatic throughout ambulation and returned to room w/o fault. Left in bed w/ call bell in reach and all needs met.   Thersia Minder Mobility Specialist  Please contact vis [employer] or  Rehab Office [phone]

## 2024-02-16 NOTE — TOC Progression Note (Addendum)
 Transition of Care [facility]) - Progression Note    Patient Details  Name: [name] MRN: [id number] Date of Birth: 07-17-1931  [facility] of [facility]) CM/SW Contact  Bridget Cordella Simmonds, LCSW Phone Number: 02/16/2024, 8:52 AM  Clinical Narrative:   SNF auth approved: 11/4 - 11/8 NRD 88/87 Rzmu#[id number]   CSW awaiting if [facility] has bed available today.  Message from [facility]: no bed until tomorrow.  Expected Discharge Plan: Skilled Nursing Facility Barriers to Discharge: Continued Medical Work up, SNF Pending bed offer               Expected Discharge Plan and Services   Discharge Planning Services: CM Consult Post Acute Care Choice: Durable Medical Equipment, Home Health Living arrangements for the past 2 months: Single Family Home                 DME Arranged: Bedside commode         HH Arranged: PT, OT           Social Drivers of Health (SDOH) Interventions SDOH Screenings   Food Insecurity: No Food Insecurity (02/07/2024)  Housing: Low Risk  (02/07/2024)  Transportation Needs: No Transportation Needs (02/07/2024)  Utilities: Not At Risk (02/07/2024)  Alcohol Screen: Low Risk  (02/16/2022)  Depression (PHQ2-9): High Risk (02/02/2023)  Financial Resource Strain: Low Risk  (02/16/2022)  Physical Activity: Insufficiently Active (02/16/2022)  Social Connections: Moderately Isolated (02/10/2024)  Stress: Stress Concern Present (02/16/2022)  Tobacco Use: Medium Risk (02/07/2024)    Readmission Risk Interventions     No data to display

## 2024-02-16 NOTE — Progress Notes (Signed)
  Progress Note   Patient: [name] FMW:[id number] DOB: 1931/12/29 DOA: 02/06/2024     9 DOS: the patient was seen and examined on 02/16/2024        Brief hospital course: 89 y.o. M with CAD s/p PCI >5 yrs, HTN, dCHF, DM, AF not on AC due to choice, OSA not on CPAP who presented with fall, found to have right hip fracture and was in Afib.  Underwent right hemiarthroplasty on 10/27 by Dr. Kendal.  Post-op course complicated by delirium.  See summary from 11/4   Assessment and Plan: * Hip fracture (HCC) -WBAT, PT/OT  Atrial fibrillation -Continue diltiazem and Eliquis  - Plan to stop Eliquis  at discharge per patient request  Acute metabolic encephalopathy Resolved on Serqoeul - Continue seroquel at night  (HFpEF) heart failure with preserved ejection fraction (HCC) Appears euvolemic - Continue daily furosemide   Coronary artery disease involving native coronary artery of native heart with angina pectoris Hypertension -Continue diltiazem, Lipitor, Lasix  - Plan to resume Imdur  at discharge   OBSTRUCTIVE SLEEP APNEA Not on CPAP  Type 2 diabetes mellitus with diabetic neuropathy, unspecified (HCC) Glucose labile - Continue glipizide  - Continue sliding scale corrections          Subjective: Patient doing well, ready for transition to rehab.  This has been delayed significantly.  No new symptoms.  Breathing is okay.     Physical Exam: BP (!) 137/99 (BP Location: Left Arm)   Pulse 88   Temp 97.6 F (36.4 C) (Oral)   Resp 18   Ht 5' 3 (1.6 m)   Wt 66.9 kg   SpO2 96%   BMI 26.13 kg/m   Adult male, sitting up in the edge of the bed, interactive and appropriate Irregularly irregular, no murmurs Respiratory rate seems easy and unlabored Attention normal, oriented x 4, face symmetric, speech fluent        Family Communication:     Disposition: Status is: Inpatient         Author: [name], MD 02/16/2024 3:54 PM  For on  call review [email or web address].

## 2024-02-17 DIAGNOSIS — E782 Mixed hyperlipidemia: Secondary | ICD-10-CM | POA: Diagnosis not present

## 2024-02-17 DIAGNOSIS — Z9181 History of falling: Secondary | ICD-10-CM | POA: Diagnosis not present

## 2024-02-17 DIAGNOSIS — R2681 Unsteadiness on feet: Secondary | ICD-10-CM | POA: Diagnosis not present

## 2024-02-17 DIAGNOSIS — I209 Angina pectoris, unspecified: Secondary | ICD-10-CM | POA: Diagnosis not present

## 2024-02-17 DIAGNOSIS — Z471 Aftercare following joint replacement surgery: Secondary | ICD-10-CM | POA: Diagnosis not present

## 2024-02-17 DIAGNOSIS — J189 Pneumonia, unspecified organism: Secondary | ICD-10-CM | POA: Diagnosis not present

## 2024-02-17 DIAGNOSIS — I350 Nonrheumatic aortic (valve) stenosis: Secondary | ICD-10-CM | POA: Diagnosis not present

## 2024-02-17 DIAGNOSIS — N138 Other obstructive and reflux uropathy: Secondary | ICD-10-CM | POA: Diagnosis not present

## 2024-02-17 DIAGNOSIS — E1140 Type 2 diabetes mellitus with diabetic neuropathy, unspecified: Secondary | ICD-10-CM | POA: Diagnosis not present

## 2024-02-17 DIAGNOSIS — I4891 Unspecified atrial fibrillation: Secondary | ICD-10-CM | POA: Diagnosis not present

## 2024-02-17 DIAGNOSIS — E440 Moderate protein-calorie malnutrition: Secondary | ICD-10-CM | POA: Diagnosis not present

## 2024-02-17 DIAGNOSIS — J449 Chronic obstructive pulmonary disease, unspecified: Secondary | ICD-10-CM | POA: Diagnosis not present

## 2024-02-17 DIAGNOSIS — F331 Major depressive disorder, recurrent, moderate: Secondary | ICD-10-CM | POA: Diagnosis not present

## 2024-02-17 DIAGNOSIS — R41841 Cognitive communication deficit: Secondary | ICD-10-CM | POA: Diagnosis not present

## 2024-02-17 DIAGNOSIS — G9341 Metabolic encephalopathy: Secondary | ICD-10-CM | POA: Diagnosis not present

## 2024-02-17 DIAGNOSIS — S72001S Fracture of unspecified part of neck of right femur, sequela: Secondary | ICD-10-CM | POA: Diagnosis not present

## 2024-02-17 DIAGNOSIS — I5030 Unspecified diastolic (congestive) heart failure: Secondary | ICD-10-CM | POA: Diagnosis not present

## 2024-02-17 DIAGNOSIS — N401 Enlarged prostate with lower urinary tract symptoms: Secondary | ICD-10-CM | POA: Diagnosis not present

## 2024-02-17 DIAGNOSIS — I2510 Atherosclerotic heart disease of native coronary artery without angina pectoris: Secondary | ICD-10-CM | POA: Diagnosis not present

## 2024-02-17 DIAGNOSIS — E785 Hyperlipidemia, unspecified: Secondary | ICD-10-CM | POA: Diagnosis not present

## 2024-02-17 DIAGNOSIS — S72001D Fracture of unspecified part of neck of right femur, subsequent encounter for closed fracture with routine healing: Secondary | ICD-10-CM | POA: Diagnosis not present

## 2024-02-17 DIAGNOSIS — M6259 Muscle wasting and atrophy, not elsewhere classified, multiple sites: Secondary | ICD-10-CM | POA: Diagnosis not present

## 2024-02-17 DIAGNOSIS — I509 Heart failure, unspecified: Secondary | ICD-10-CM | POA: Diagnosis not present

## 2024-02-17 DIAGNOSIS — G4733 Obstructive sleep apnea (adult) (pediatric): Secondary | ICD-10-CM | POA: Diagnosis not present

## 2024-02-17 DIAGNOSIS — M159 Polyosteoarthritis, unspecified: Secondary | ICD-10-CM | POA: Diagnosis not present

## 2024-02-17 DIAGNOSIS — Z741 Need for assistance with personal care: Secondary | ICD-10-CM | POA: Diagnosis not present

## 2024-02-17 DIAGNOSIS — E119 Type 2 diabetes mellitus without complications: Secondary | ICD-10-CM | POA: Diagnosis not present

## 2024-02-17 DIAGNOSIS — M6281 Muscle weakness (generalized): Secondary | ICD-10-CM | POA: Diagnosis not present

## 2024-02-17 DIAGNOSIS — H6063 Unspecified chronic otitis externa, bilateral: Secondary | ICD-10-CM | POA: Diagnosis not present

## 2024-02-17 DIAGNOSIS — I119 Hypertensive heart disease without heart failure: Secondary | ICD-10-CM | POA: Diagnosis not present

## 2024-02-17 LAB — GLUCOSE, CAPILLARY
Glucose-Capillary: 147 mg/dL — ABNORMAL HIGH (ref 70–99)
Glucose-Capillary: 162 mg/dL — ABNORMAL HIGH (ref 70–99)
Glucose-Capillary: 198 mg/dL — ABNORMAL HIGH (ref 70–99)
Glucose-Capillary: 177 mg/dL — ABNORMAL HIGH (ref 70–99)

## 2024-02-17 MED ORDER — FUROSEMIDE 40 MG PO TABS: 40.0000 mg | ORAL_TABLET | Freq: Every day | ORAL | Status: AC

## 2024-02-17 MED ORDER — PROSOURCE PLUS PO LIQD: 30.0000 mL | Freq: Two times a day (BID) | ORAL | Status: AC

## 2024-02-17 MED ORDER — QUETIAPINE FUMARATE 25 MG PO TABS: 25.0000 mg | ORAL_TABLET | Freq: Every day | ORAL | Status: DC

## 2024-02-17 MED ORDER — DILTIAZEM HCL ER COATED BEADS 240 MG PO CP24: 240.0000 mg | ORAL_CAPSULE | Freq: Every day | ORAL | Status: DC

## 2024-02-17 MED ORDER — ENSURE PLUS HIGH PROTEIN PO LIQD: 237.0000 mL | Freq: Two times a day (BID) | ORAL | Status: AC

## 2024-02-17 MED ORDER — HYDROCODONE-ACETAMINOPHEN 5-325 MG PO TABS: 1.0000 | ORAL_TABLET | ORAL | 0 refills | Status: DC | PRN

## 2024-02-17 NOTE — Plan of Care (Signed)

## 2024-02-17 NOTE — TOC Progression Note (Signed)
 Transition of Care [facility]) - Progression Note    Patient Details  Name: [name] MRN: [id number] Date of Birth: September 21, 1931  [facility] CM/SW Contact  Bridget Cordella Simmonds, LCSW Phone Number: 02/17/2024, 10:27 AM  Clinical Narrative:   CSW confirmed with [facility] that they can receive pt today.  MD aware.  CSW spoke with pt, wife, multiple family members in room regarding transportation.  They can transport pt to [facility].     Expected Discharge Plan: Skilled Nursing Facility Barriers to Discharge: Continued Medical Work up, SNF Pending bed offer               Expected Discharge Plan and Services   Discharge Planning Services: CM Consult Post Acute Care Choice: Durable Medical Equipment, Home Health Living arrangements for the past 2 months: Single Family Home                 DME Arranged: Bedside commode         HH Arranged: PT, OT           Social Drivers of Health (SDOH) Interventions SDOH Screenings   Food Insecurity: No Food Insecurity (02/07/2024)  Housing: Low Risk  (02/07/2024)  Transportation Needs: No Transportation Needs (02/07/2024)  Utilities: Not At Risk (02/07/2024)  Alcohol Screen: Low Risk  (02/16/2022)  Depression (PHQ2-9): High Risk (02/02/2023)  Financial Resource Strain: Low Risk  (02/16/2022)  Physical Activity: Insufficiently Active (02/16/2022)  Social Connections: Moderately Isolated (02/10/2024)  Stress: Stress Concern Present (02/16/2022)  Tobacco Use: Medium Risk (02/07/2024)    Readmission Risk Interventions     No data to display

## 2024-02-17 NOTE — Discharge Summary (Signed)
 Physician Discharge Summary   Patient: [name] MRN: [id number] DOB: 07-May-1931  Admit date:     02/06/2024  Discharge date: 02/17/24  Discharge Physician: [name]   PCP: Jordan, [name], MD     Recommendations at discharge:  Follow up with Dr. Kendal Orthopedics in 1 week for hip fracture repair Follow up with Dr. Monetta Cardiology for atrial fibrillation in 2-3 weeks [facility]: Check BMP in 1 week  Recommend stopping quetiapine before discharge home Check glucose twice daily and adjust diabetes medications appropriately     Discharge Diagnoses: Principal Problem:   Hip fracture (HCC) Active Problems:   Type 2 diabetes mellitus with diabetic neuropathy, unspecified (HCC)   OBSTRUCTIVE SLEEP APNEA   Hypertension with heart disease   Coronary artery disease involving native coronary artery of native heart with angina pectoris   (HFpEF) heart failure with preserved ejection fraction (HCC)   Atrial fibrillation with RVR (HCC)   Malnutrition of moderate degree   Acute metabolic encephalopathy   CAP (community acquired pneumonia)      Hospital Course: 89 y.o. M with CAD s/p PCI >5 yrs, HTN, dCHF, DM, AF not on AC due to choice, OSA not on CPAP who presented with fall, found to have right hip fracture and was in Afib.  Underwent right hemiarthroplasty on 10/27 by Dr. Kendal.  Post-op course complicated by delirium.      Hip fracture [facility]) S/p R hemiarthropasty on 10/27 by Dr. Kendal Admitted and Orthopedics consulted, taken to OR for hemiarthroplasty on 10/27 by Dr. Kendal.  Postop course complicated by pneumonia and atrial fibrillation.  Completed antibiotics for pneumonia, atrial fibrillation controlled with diltiazem.  He had some right leg swelling, ultrasound showed no evidence of DVT.     CAP (community acquired pneumonia) Developed new oxygen requirement and tachypnea on 10/28.  Completed 5 days antibiotics.  Resolved.   Acute metabolic  encephalopathy Patient developed agitated delirium postoperatively.  Nightly Seroquel was started and this improved. -Continue Seroquel for the next week, then taper off and stop   Malnutrition of moderate degree Continue new nutritional supplements.   Atrial fibrillation with RVR (HCC) Patient has longstanding A-fib.  CHA2DS2-Vasc 5.  Apixaban  has been recommended but deferred due to patient wishes.  Again discussed with patient by Cardiology here, again deferred.  Here, he was transitioned onto diltiazem by Dr. Barbaraann.  This controlled HR well, and he should see his primary cardiologist in 2-3 weeks.      (HFpEF) heart failure with preserved ejection fraction (HCC) Appeared euvolemic on home Lasix .   Coronary artery disease involving native coronary artery of native heart with angina pectoris Stable on Lipitor, Imdur    Hypertension with heart disease Blood pressure controlled here   OBSTRUCTIVE SLEEP APNEA Not on CPAP   Type 2 diabetes mellitus with diabetic neuropathy, unspecified (HCC) He has had some persistently high glucose until glipizide  resumed.                     The [place]  Controlled Substances Registry was reviewed for this patient prior to discharge.   Consultants: Orthopedics Dr. Kendal Cardiology Dr. Barbaraann  Procedures performed:  Hemiarthroplasty  Disposition: Skilled nursing facility Diet recommendation:  Discharge Diet Orders (From admission, onward)     Start     Ordered   02/17/24 0000  Diet - low sodium heart healthy        02/17/24 1052  DISCHARGE MEDICATION: Allergies as of 02/17/2024   No Known Allergies      Medication List     PAUSE taking these medications    valsartan  80 MG tablet Wait to take this until your doctor or other care provider tells you to start again. Commonly known as: DIOVAN  TAKE 1 TABLET BY MOUTH EVERY DAY       TAKE these medications    (feeding supplement) PROSource Plus  liquid Take 30 mLs by mouth 2 (two) times daily between meals.   feeding supplement Liqd Take 237 mLs by mouth 2 (two) times daily between meals.   Accu-Chek Aviva Connect w/Device Kit 1 Device by Does not apply route daily.   ONE TOUCH ULTRA 2 w/Device Kit Daily as directed.   acetaminophen  500 MG tablet Commonly known as: TYLENOL  Take 1,000 mg by mouth every 6 (six) hours as needed for mild pain (pain score 1-3) or moderate pain (pain score 4-6).   alfuzosin  10 MG 24 hr tablet Commonly known as: UROXATRAL  TAKE 1 TABLET (10 MG TOTAL) BY MOUTH DAILY WITH BREAKFAST.   B-12 1000 MCG Tabs Take 1,000 mcg by mouth daily.   Combivent  Respimat 20-100 MCG/ACT Aers respimat Generic drug: Ipratropium-Albuterol  Inhale 1 puff into the lungs every 6 (six) hours as needed for wheezing.   diltiazem 240 MG 24 hr capsule Commonly known as: CARDIZEM CD Take 1 capsule (240 mg total) by mouth daily. Start taking on: February 18, 2024   finasteride  5 MG tablet Commonly known as: PROSCAR  TAKE 1 TABLET BY MOUTH EVERY DAY   furosemide  40 MG tablet Commonly known as: LASIX  Take 1 tablet (40 mg total) by mouth daily. Start taking on: February 18, 2024 What changed: when to take this   glipiZIDE  5 MG tablet Commonly known as: GLUCOTROL  TAKE 1 TABLET BY MOUTH DAILY BEFORE BREAKFAST. 20-30 MINUTES BEFORE MEAL.   HYDROcodone -acetaminophen  5-325 MG tablet Commonly known as: NORCO/VICODIN Take 1-2 tablets by mouth every 4 (four) hours as needed for moderate pain (pain score 4-6) or severe pain (pain score 7-10).   isosorbide  mononitrate 30 MG 24 hr tablet Commonly known as: IMDUR  TAKE 1 TABLET BY MOUTH EVERY DAY   Lancet Device with Ejector Misc 1 Device by Does not apply route daily.   loperamide 2 MG tablet Commonly known as: IMODIUM A-D Take 2 mg by mouth 4 (four) times daily as needed for diarrhea or loose stools.   nitroGLYCERIN  0.4 MG SL tablet Commonly known as: NITROSTAT  Place 1  tablet (0.4 mg total) under the tongue every 5 (five) minutes as needed for chest pain.   OneTouch Delica Lancets 33G Misc USE TO CHECK BLOOD SUGAR DAILY AND PRN   OneTouch Ultra test strip Generic drug: glucose blood USE TO CHECK BLOOD SUGAR ONCE DAILY E11.9   QUEtiapine 25 MG tablet Commonly known as: SEROQUEL Take 1 tablet (25 mg total) by mouth at bedtime for 5 days.   tizanidine  2 MG capsule Commonly known as: ZANAFLEX  TAKE 4 CAPSULES (8 MG TOTAL) BY MOUTH AT BEDTIME AS NEEDED FOR MUSCLE SPASMS.   Vitamin D (Ergocalciferol) 1.25 MG (50000 UNIT) Caps capsule Commonly known as: DRISDOL Take 1 capsule (50,000 Units total) by mouth every 7 (seven) days.               Discharge Care Instructions  (From admission, onward)           Start     Ordered   02/17/24 0000  Discharge wound care:  Comments: Leave dressing in place until Orthopedics office follow up   02/17/24 1052            Contact information for follow-up providers     Haddix, Franky SQUIBB, MD. Schedule an appointment as soon as possible for a visit in 2 week(s).   Specialty: Orthopedic Surgery Why: for wound check and repeat x-rays Contact information: [address] [place] [address] [phone]         Monetta Redell PARAS, MD Follow up.   Specialties: Cardiology, Radiology Why: Cardiology follow-up arranged on Wednesday Mar 08, 2024 10:20 AM. Lucrezia 15 minutes prior to appointment to check in. Contact information: [address] [place] [address] [phone]              Contact information for after-discharge care     Destination     [place] [facility] [place], [place] .   Service: Skilled Nursing Contact information: [address] [place]  [address] [phone]                     Discharge Instructions     Diet - low sodium heart healthy   Complete by: As directed    Discharge instructions   Complete by: As directed     **IMPORTANT DISCHARGE INSTRUCTIONS**   From Dr. Jonel: You were admitted for a hip fracture.  While you were here, things were complicated by atrial fibrillation (your old abnormal heart rhythm, that flared up here, causing fast heart rate) and a pneumonia  The cardiologist here, started diltiazem for your heart rhythm.  This is a medicine to slow heart rate (and also lower blood pressure) We recommend a blood thinner apixaban , but respect your wishes to avoid it and recommend you discuss again with Dr. Monetta  Call Dr. Leandrew office to make sure you can be seen in 2-3 weeks  STOP valsartan  for now, until you see Dr. Monetta Continue the new diltiazem Continue your Lasix  as you were taking previously Ask Karrin to check your labs next week  For the next 5 days take quetiapine/Seroquel 25 mg nightly After one week, stop this medicine   Discharge wound care:   Complete by: As directed    Leave dressing in place until Orthopedics office follow up   Increase activity slowly   Complete by: As directed        Discharge Exam: Filed Weights   02/06/24 2334 02/07/24 1239 02/15/24 1300  Weight: 65.8 kg (P) 65.8 kg 66.9 kg    General: Pt is alert, awake, not in acute distress Cardiovascular: Irregular, rate controled, soft SEM.  Trace nonpitting RLE edema. Respiratory: Normal respiratory rate and rhythm.  CTAB without rales or wheezes. Abdominal: Abdomen soft and non-tender.  No distension or HSM.   Neuro/Psych: Strength symmetric in upper and lower extremities.  Judgment and insight appear normal.   Condition at discharge: good  The results of significant diagnostics from this hospitalization (including imaging, microbiology, ancillary and laboratory) are listed below for reference.   Imaging Studies: VAS US  LOWER EXTREMITY VENOUS (DVT) Result Date: 02/13/2024  Lower Venous DVT Study Patient Name:  LIBRADO GUANDIQUE Hamberger  Date of Exam:   02/12/2024 Medical Rec #: [id number]       Accession #:    [id number] Date of Birth: Aug 18, 1931      Patient Gender: M Patient Age:   89 years Exam Location:  [facility] Procedure:      VAS US  LOWER  EXTREMITY VENOUS (DVT) Referring Phys: Anaija Wissink --------------------------------------------------------------------------------  Indications: Swelling. Recent hemiarthroplasty of right hip status post hip fracture 02/07/24  Comparison Study: No prior study on file Performing Technologist: Alberta Lis RVS  Examination Guidelines: A complete evaluation includes B-mode imaging, spectral Doppler, color Doppler, and power Doppler as needed of all accessible portions of each vessel. Bilateral testing is considered an integral part of a complete examination. Limited examinations for reoccurring indications may be performed as noted. The reflux portion of the exam is performed with the patient in reverse Trendelenburg.  +---------+---------------+---------+-----------+----------+--------------+ RIGHT    CompressibilityPhasicitySpontaneityPropertiesThrombus Aging +---------+---------------+---------+-----------+----------+--------------+ CFV      Full           Yes      No                                  +---------+---------------+---------+-----------+----------+--------------+ SFJ      Full                                                        +---------+---------------+---------+-----------+----------+--------------+ FV Prox  Full           Yes      No                                  +---------+---------------+---------+-----------+----------+--------------+ FV Mid   Full           Yes      No                                  +---------+---------------+---------+-----------+----------+--------------+ FV DistalFull           Yes      No                                  +---------+---------------+---------+-----------+----------+--------------+ PFV      Full           Yes      No                                   +---------+---------------+---------+-----------+----------+--------------+ POP      Full           Yes      No                                  +---------+---------------+---------+-----------+----------+--------------+ PTV      Full                                                        +---------+---------------+---------+-----------+----------+--------------+ PERO     Full                                                        +---------+---------------+---------+-----------+----------+--------------+   +----+---------------+---------+-----------+----------+--------------+  LEFTCompressibilityPhasicitySpontaneityPropertiesThrombus Aging +----+---------------+---------+-----------+----------+--------------+ CFV Full           Yes      Yes                                 +----+---------------+---------+-----------+----------+--------------+ SFJ Full                                                        +----+---------------+---------+-----------+----------+--------------+     Summary: RIGHT: - There is no evidence of deep vein thrombosis in the lower extremity.  - No cystic structure found in the popliteal fossa.  LEFT: - No evidence of common femoral vein obstruction.   *See table(s) above for measurements and observations. Electronically signed by Norman Serve on 02/13/2024 at 8:33:03 AM.    Final    DG CHEST PORT 1 VIEW Result Date: 02/09/2024 EXAM: 1 VIEW(S) XRAY OF THE CHEST 02/09/2024 01:04:00 AM COMPARISON: Chest x-ray 02/06/1999 and 02/05/2024. CLINICAL HISTORY: Shortness of breath. SOB. FINDINGS: LUNGS AND PLEURA: There is a band of opacity in the right mid lung which is new from prior. No pulmonary edema. No pleural effusion. No pneumothorax. HEART AND MEDIASTINUM: The heart is enlarged, unchanged. There are atherosclerotic calcifications of the aorta. BONES AND SOFT TISSUES: No acute osseous abnormality. IMPRESSION: 1. New band of opacity in  the right mid lung. 2. Enlarged heart, unchanged. Electronically signed by: Greig Pique MD 02/09/2024 01:16 AM EDT RP Workstation: [id number]   DG HIP PORT UNILAT W OR W/O PELVIS 1V RIGHT Result Date: 02/08/2024 CLINICAL DATA:  Status post right hemiarthroplasty. EXAM: DG HIP (WITH OR WITHOUT PELVIS) 1V PORT RIGHT COMPARISON:  C-arm images dated 02/07/2024.  CT dated 02/07/2024. FINDINGS: Right hip bipolar prosthesis in satisfactory position and alignment. No fracture or dislocation seen. Diffuse osteopenia. Lower lumbar spine fixation hardware. IMPRESSION: Satisfactory postoperative appearance of a right hip bipolar prosthesis. Electronically Signed   By: Elspeth Bathe M.D.   On: 02/08/2024 14:49   DG HIP UNILAT WITH PELVIS 1V RIGHT Result Date: 02/07/2024 CLINICAL DATA:  Elective surgery. EXAM: DG HIP (WITH OR WITHOUT PELVIS) 1V RIGHT COMPARISON:  Preoperative imaging FINDINGS: Six fluoroscopic spot views of the pelvis and right hip obtained in the operating room. Sequential images during hip arthroplasty. Fluoroscopy time 9.8 seconds. Dose 0.9359 mGy. IMPRESSION: Intraoperative fluoroscopy during right hip hemiarthroplasty. Electronically Signed   By: Andrea Gasman M.D.   On: 02/07/2024 16:25   ECHOCARDIOGRAM COMPLETE Result Date: 02/07/2024    ECHOCARDIOGRAM REPORT   Patient Name:   GAGE WEANT Date of Exam: 02/07/2024 Medical Rec #:  [id number]     Height:       63.0 in Accession #:    [id number]    Weight:       145.0 lb Date of Birth:  Oct 17, 1931     BSA:          1.687 m Patient Age:    89 years      BP:           134/95 mmHg Patient Gender: M             HR:           124 bpm. Exam Location:  Inpatient Procedure: 2D Echo (Both Spectral and  Color Flow Doppler were utilized during            procedure). Indications:    Murmur  History:        Patient has prior history of Echocardiogram examinations.                 Signs/Symptoms:Murmur.  Sonographer:    Norleen Amour Referring Phys: ZANE  ADAMS IMPRESSIONS  1. Severe paradoxical low flow low gradient aortic stenosis is present. The aortic valve is tricuspid. There is severe calcifcation of the aortic valve. There is severe thickening of the aortic valve. Aortic valve regurgitation is trivial. Severe aortic  valve stenosis. Aortic valve area, by VTI measures 0.85 cm. Aortic valve mean gradient measures 15.8 mmHg. Aortic valve Vmax measures 2.65 m/s.  2. Concerns for severe mitral regurgitation. Significant splay artifact concerning for at least moderate mitral regurgitation. Consider TEE for clarification and quantification of mitral regurgitation. The mitral valve is degenerative. Moderate to severe mitral valve regurgitation. Severe mitral annular calcification.  3. Left ventricular ejection fraction, by estimation, is 45 to 50%. The left ventricle has mildly decreased function. The left ventricle has no regional wall motion abnormalities. There is moderate concentric left ventricular hypertrophy. Left ventricular diastolic function could not be evaluated.  4. Right ventricular systolic function is mildly reduced. The right ventricular size is normal. There is normal pulmonary artery systolic pressure. The estimated right ventricular systolic pressure is 30.0 mmHg.  5. Left atrial size was severely dilated.  6. Right atrial size was severely dilated.  7. Tricuspid valve regurgitation is mild to moderate.  8. The inferior vena cava is normal in size with greater than 50% respiratory variability, suggesting right atrial pressure of 3 mmHg. FINDINGS  Left Ventricle: Left ventricular ejection fraction, by estimation, is 45 to 50%. The left ventricle has mildly decreased function. The left ventricle has no regional wall motion abnormalities. The left ventricular internal cavity size was normal in size. There is moderate concentric left ventricular hypertrophy. Left ventricular diastolic function could not be evaluated due to atrial fibrillation. Left  ventricular diastolic function could not be evaluated. Right Ventricle: The right ventricular size is normal. No increase in right ventricular wall thickness. Right ventricular systolic function is mildly reduced. There is normal pulmonary artery systolic pressure. The tricuspid regurgitant velocity is 2.60 m/s, and with an assumed right atrial pressure of 3 mmHg, the estimated right ventricular systolic pressure is 30.0 mmHg. Left Atrium: Left atrial size was severely dilated. Right Atrium: Right atrial size was severely dilated. Pericardium: Trivial pericardial effusion is present. Mitral Valve: Concerns for severe mitral regurgitation. Significant splay artifact concerning for at least moderate mitral regurgitation. Consider TEE for clarification and quantification of mitral regurgitation. The mitral valve is degenerative in appearance. Severe mitral annular calcification. Moderate to severe mitral valve regurgitation. MV peak gradient, 9.2 mmHg. The mean mitral valve gradient is 4.0 mmHg. Tricuspid Valve: The tricuspid valve is grossly normal. Tricuspid valve regurgitation is mild to moderate. No evidence of tricuspid stenosis. Aortic Valve: Severe paradoxical low flow low gradient aortic stenosis is present. The aortic valve is tricuspid. There is severe calcifcation of the aortic valve. There is severe thickening of the aortic valve. Aortic valve regurgitation is trivial. Severe aortic stenosis is present. Aortic valve mean gradient measures 15.8 mmHg. Aortic valve peak gradient measures 28.0 mmHg. Aortic valve area, by VTI measures 0.85 cm. Pulmonic Valve: The pulmonic valve was grossly normal. Pulmonic valve regurgitation is trivial. No evidence of pulmonic stenosis.  Aorta: The aortic root and ascending aorta are structurally normal, with no evidence of dilitation. Venous: The inferior vena cava is normal in size with greater than 50% respiratory variability, suggesting right atrial pressure of 3 mmHg.  IAS/Shunts: The atrial septum is grossly normal.  LEFT VENTRICLE PLAX 2D LVIDd:         5.10 cm     Diastology LVIDs:         3.70 cm     LV e' medial:    7.65 cm/s LV PW:         1.20 cm     LV E/e' medial:  18.6 LV IVS:        1.40 cm     LV e' lateral:   9.85 cm/s LVOT diam:     1.95 cm     LV E/e' lateral: 14.4 LV SV:         43 LV SV Index:   25 LVOT Area:     2.99 cm  LV Volumes (MOD) LV vol d, MOD A2C: 73.9 ml LV vol d, MOD A4C: 90.6 ml LV vol s, MOD A2C: 44.6 ml LV vol s, MOD A4C: 49.8 ml LV SV MOD A2C:     29.3 ml LV SV MOD A4C:     90.6 ml LV SV MOD BP:      36.1 ml RIGHT VENTRICLE             IVC RV Basal diam:  3.10 cm     IVC diam: 1.80 cm RV S prime:     15.70 cm/s TAPSE (M-mode): 1.9 cm LEFT ATRIUM              Index        RIGHT ATRIUM           Index LA diam:        4.80 cm  2.85 cm/m   RA Area:     20.20 cm LA Vol (A2C):   102.0 ml 60.48 ml/m  RA Volume:   52.20 ml  30.95 ml/m LA Vol (A4C):   57.9 ml  34.33 ml/m LA Biplane Vol: 80.8 ml  47.91 ml/m  AORTIC VALVE                     PULMONIC VALVE AV Area (Vmax):    0.92 cm      PV Vmax:       1.02 m/s AV Area (Vmean):   0.87 cm      PV Peak grad:  4.2 mmHg AV Area (VTI):     0.85 cm AV Vmax:           264.80 cm/s AV Vmean:          188.200 cm/s AV VTI:            0.503 m AV Peak Grad:      28.0 mmHg AV Mean Grad:      15.8 mmHg LVOT Vmax:         81.90 cm/s LVOT Vmean:        54.667 cm/s LVOT VTI:          0.143 m LVOT/AV VTI ratio: 0.28  AORTA Ao Root diam: 3.50 cm Ao Asc diam:  3.50 cm MITRAL VALVE                TRICUSPID VALVE MV Peak grad: 9.2 mmHg      TR Peak grad:   27.0  mmHg MV Mean grad: 4.0 mmHg      TR Vmax:        260.00 cm/s MV Vmax:      1.52 m/s MV Vmean:     93.5 cm/s     SHUNTS MV E velocity: 142.00 cm/s  Systemic VTI:  0.14 m                             Systemic Diam: 1.95 cm Darryle Decent MD Electronically signed by Darryle Decent MD Signature Date/Time: 02/07/2024/2:41:46 PM    Final    CT HIP RIGHT WO  CONTRAST Result Date: 02/07/2024 CLINICAL DATA:  Hip pain status post fall. Right femoral neck fracture. Surgical planning. EXAM: CT OF THE RIGHT HIP WITHOUT CONTRAST TECHNIQUE: Multidetector CT imaging of the right hip was performed according to the standard protocol. Multiplanar CT image reconstructions were also generated. RADIATION DOSE REDUCTION: This exam was performed according to the departmental dose-optimization program which includes automated exposure control, adjustment of the mA and/or kV according to patient size and/or use of iterative reconstruction technique. COMPARISON:  Hip radiographs 02/06/2024.  Pelvic CT 10/13/2012. FINDINGS: Bones/Joint/Cartilage Mildly impacted and mildly displaced acute fracture of the right femoral neck noted with associated mild apex anterior angulation and minimal anterior superior displacement. No dislocation or involvement of the femoral head articular surface. There are moderate underlying right hip degenerative changes. No significant hip joint effusion. No evidence of acute fracture of the visualized right hemipelvis. There are sacroiliac degenerative changes and postsurgical changes in the lower lumbar spine from previous fusion. Ligaments Suboptimally assessed by CT. Muscles and Tendons Mild generalized atrophy. No focal intramuscular hematoma or other focal abnormality identified. Soft tissues Mild subcutaneous edema laterally without well-defined hematoma or other focal fluid collection. No evidence of foreign body or soft tissue emphysema. Iliofemoral atherosclerosis and mild prostatomegaly noted. IMPRESSION: 1. Acute mildly impacted and mildly displaced fracture of the right femoral neck as described. 2. No evidence of acute fracture of the visualized right hemipelvis. 3. Moderate underlying right hip degenerative changes. Electronically Signed   By: Elsie Perone M.D.   On: 02/07/2024 10:07   DG Chest 1 View Result Date: 02/06/2024 EXAM: 1 VIEW XRAY  OF THE CHEST 02/06/2024 11:52:21 PM COMPARISON: None available. CLINICAL HISTORY: Hip pain post fall. Hip pain post fall. FINDINGS: LUNGS AND PLEURA: Low lung volumes. Resultant bronchovascular crowding at the hila. Patchy airspace opacity in the right mid lung zone peripherally. No pleural effusion. No pneumothorax. HEART AND MEDIASTINUM: Mild cardiomegaly. Aortic atherosclerosis. BONES AND SOFT TISSUES: Multilevel thoracic osteophytosis. No acute osseous abnormality. IMPRESSION: 1. Patchy airspace opacity in the right mid lung zone peripherally, possibly infectious in the acute setting . 2. Mild cardiomegaly Electronically signed by: Dorethia Molt MD 02/06/2024 11:58 PM EDT RP Workstation: [id number]   DG Hip Unilat W or Wo Pelvis 2-3 Views Right Result Date: 02/06/2024 EXAM: 2 Or 3 VIEW(S) XRAY OF THE Right Hip 02/06/2024 11:52:21 PM COMPARISON: None available. CLINICAL HISTORY: Hip pain post fall. FINDINGS: BONES AND JOINTS: Impacted acute subcapital right femoral neck fracture noted. Femoral head is still seated within the right acetabulum. Mild bilateral degenerative hip arthritis. SOFT TISSUES: The soft tissues are unremarkable. IMPRESSION: 1. Acute impacted subcapital right femoral neck fracture. No dislocation 2. Mild bilateral degenerative hip arthritis. Electronically signed by: Dorethia Molt MD 02/06/2024 11:56 PM EDT RP Workstation: [id number]   CT Head Wo Contrast Result Date: 02/06/2024 EXAM: CT  HEAD WITHOUT CONTRAST 02/06/2024 11:30:47 PM TECHNIQUE: CT of the head was performed without the administration of intravenous contrast. Automated exposure control, iterative reconstruction, and/or weight based adjustment of the mA/kV was utilized to reduce the radiation dose to as low as reasonably achievable. COMPARISON: None available. CLINICAL HISTORY: Head trauma, minor (Age >= 65y). FINDINGS: BRAIN AND VENTRICLES: Global cortical atrophy. Subcortical and periventricular small vessel ischemic  changes. Intracranial atherosclerosis. No acute hemorrhage. No evidence of acute infarct. No hydrocephalus. No extra-axial collection. No mass effect or midline shift. ORBITS: No acute abnormality. SINUSES: No acute abnormality. SOFT TISSUES AND SKULL: No acute soft tissue abnormality. No skull fracture. IMPRESSION: 1. No acute intracranial abnormality. Electronically signed by: Pinkie Pebbles MD 02/06/2024 11:31 PM EDT RP Workstation: [id number]    Microbiology: Results for orders placed or performed during the hospital encounter of 02/06/24  Surgical pcr screen     Status: None   Collection Time: 02/07/24  9:50 AM   Specimen: Nasal Mucosa; Nasal Swab  Result Value Ref Range Status   MRSA, PCR NEGATIVE NEGATIVE Final   Staphylococcus aureus NEGATIVE NEGATIVE Final    Comment: (NOTE) The Xpert SA Assay (FDA approved for NASAL specimens in patients 22 years of age and older), is one component of a comprehensive surveillance program. It is not intended to diagnose infection nor to guide or monitor treatment. Performed at [facility], [address] [place], [place] [address]     Labs: CBC: Recent Labs  Lab 02/11/24 0400 02/13/24 1610  WBC 8.4 9.5  HGB 11.8* 13.8  HCT 35.3* 42.4  MCV 93.1 94.6  PLT 188 242   Basic Metabolic Panel: Recent Labs  Lab 02/11/24 0400 02/13/24 1610  NA 138 143  K 3.7 4.5  CL 103 103  CO2 25 27  GLUCOSE 146* 335*  BUN 22 26*  CREATININE 0.63 0.83  CALCIUM  8.0* 8.9  PHOS 2.7  --    Liver Function Tests: Recent Labs  Lab 02/11/24 0400  ALBUMIN 2.6*   CBG: Recent Labs  Lab 02/16/24 1618 02/16/24 2049 02/17/24 0100 02/17/24 0351 02/17/24 0808  GLUCAP 231* 219* 147* 162* 198*    Discharge time spent: approximately 45 minutes spent on discharge counseling, evaluation of patient on day of discharge, and coordination of discharge planning with nursing, social work, pharmacy and case management  Signed: [name],  MD [facility] 02/17/2024

## 2024-02-17 NOTE — Progress Notes (Signed)
 Report called to [employer] at [facility]

## 2024-02-17 NOTE — Progress Notes (Signed)
 Mobility Specialist Progress Note:    02/17/24 1132  Mobility  Activity Ambulated with assistance  Level of Assistance Contact guard assist, steadying assist  Assistive Device Front wheel walker  Distance Ambulated (ft) 120 ft  RLE Weight Bearing Per Provider Order WBAT  Activity Response Tolerated well  Mobility Referral Yes  Mobility visit 1 Mobility  Mobility Specialist Start Time (ACUTE ONLY) 0945  Mobility Specialist Stop Time (ACUTE ONLY) 1000  Mobility Specialist Time Calculation (min) (ACUTE ONLY) 15 min   Received pt in bed c/o back pain yet agreeable to mobility. No c/o throughout ambulation. Returned to room w/o fault. Left in chair w/ call bell in reach and all needs met. Chair alarm on.   Thersia Minder Mobility Specialist  Please contact vis Secure Chat or  Rehab Office [phone]

## 2024-02-17 NOTE — TOC Transition Note (Addendum)
 Transition of Care [facility]) - Discharge Note   Patient Details  Name: [name] MRN: [id number] Date of Birth: [birth year]  [facility]) CM/SW Contact:  Bridget Cordella Simmonds, LCSW Phone Number: 02/17/2024, 11:26 AM   Clinical Narrative:   Pt discharging to [facility], room 223. RN call report to [phone].   Pt family will transport pt to SNF and will need pt brought down to main north tower entrance with assistance getting into the vehicle.     Final next level of care: Skilled Nursing Facility Barriers to Discharge: Barriers Resolved   Patient Goals and CMS Choice Patient states their goals for this hospitalization and ongoing recovery are:: bacl tp mpr,a; CMS Medicare.gov Compare Post Acute Care list provided to:: Patient Choice offered to / list presented to : Patient      Discharge Placement              Patient [facility] bed at: [facility] Patient to be transferred to facility by: wife/family Name of family member notified: wife Heddy in room Patient and family notified of of transfer: 02/17/24  Discharge Plan and Services Additional resources added to the After Visit Summary for     Discharge Planning Services: CM Consult Post Acute Care Choice: Durable Medical Equipment, Home Health          DME Arranged: Bedside commode         HH Arranged: PT, OT          Social Drivers of Health (SDOH) Interventions SDOH Screenings   Food Insecurity: No Food Insecurity (02/07/2024)  Housing: Low Risk  (02/07/2024)  Transportation Needs: No Transportation Needs (02/07/2024)  Utilities: Not At Risk (02/07/2024)  Alcohol Screen: Low Risk  (02/16/2022)  Depression (PHQ2-9): High Risk (02/02/2023)  Financial Resource Strain: Low Risk  (02/16/2022)  Physical Activity: Insufficiently Active (02/16/2022)  Social Connections: Moderately Isolated (02/10/2024)  Stress: Stress Concern Present (02/16/2022)  Tobacco Use: Medium Risk (02/07/2024)      Readmission Risk Interventions     No data to display

## 2024-02-18 DIAGNOSIS — E785 Hyperlipidemia, unspecified: Secondary | ICD-10-CM | POA: Diagnosis not present

## 2024-02-18 DIAGNOSIS — E119 Type 2 diabetes mellitus without complications: Secondary | ICD-10-CM | POA: Diagnosis not present

## 2024-02-18 DIAGNOSIS — I509 Heart failure, unspecified: Secondary | ICD-10-CM | POA: Diagnosis not present

## 2024-02-18 DIAGNOSIS — I2510 Atherosclerotic heart disease of native coronary artery without angina pectoris: Secondary | ICD-10-CM | POA: Diagnosis not present

## 2024-02-21 DIAGNOSIS — I509 Heart failure, unspecified: Secondary | ICD-10-CM | POA: Diagnosis not present

## 2024-02-21 DIAGNOSIS — I2510 Atherosclerotic heart disease of native coronary artery without angina pectoris: Secondary | ICD-10-CM | POA: Diagnosis not present

## 2024-02-21 DIAGNOSIS — I209 Angina pectoris, unspecified: Secondary | ICD-10-CM | POA: Diagnosis not present

## 2024-02-22 DIAGNOSIS — E119 Type 2 diabetes mellitus without complications: Secondary | ICD-10-CM | POA: Diagnosis not present

## 2024-02-22 DIAGNOSIS — I2510 Atherosclerotic heart disease of native coronary artery without angina pectoris: Secondary | ICD-10-CM | POA: Diagnosis not present

## 2024-02-22 DIAGNOSIS — I209 Angina pectoris, unspecified: Secondary | ICD-10-CM | POA: Diagnosis not present

## 2024-02-22 DIAGNOSIS — E785 Hyperlipidemia, unspecified: Secondary | ICD-10-CM | POA: Diagnosis not present

## 2024-02-22 DIAGNOSIS — I509 Heart failure, unspecified: Secondary | ICD-10-CM | POA: Diagnosis not present

## 2024-02-23 ENCOUNTER — Inpatient Hospital Stay: Admitting: Family Medicine

## 2024-02-23 ENCOUNTER — Telehealth: Payer: Self-pay | Admitting: Family Medicine

## 2024-02-23 ENCOUNTER — Ambulatory Visit: Payer: Self-pay

## 2024-02-23 ENCOUNTER — Telehealth: Payer: Self-pay

## 2024-02-23 DIAGNOSIS — Z471 Aftercare following joint replacement surgery: Secondary | ICD-10-CM | POA: Diagnosis not present

## 2024-02-23 DIAGNOSIS — S72001S Fracture of unspecified part of neck of right femur, sequela: Secondary | ICD-10-CM | POA: Diagnosis not present

## 2024-02-23 NOTE — Telephone Encounter (Signed)
 Pt wife arrived skipping the line and stating that she needed to speak with someone. After letting pt wife know that we have to tend to the other pts that were ahead of her she proceeded to say that she asked to skip the line.   Pt wife stated that her husband has a 11:30am appt and that they were late. After looking at the clock they were indeed 18 mins late. Let pt know that Dr is busy seeing pts per Dr and that we would need to resched. Pt wife and daughter stated that they came all the way here and now have to go back? I let them know that when they called in at 11:30am that they would need to resched however they declined and stated they were going to show up anyway.   Pt wife and daughter got upset and stated that they will resched. Resched them on Fri. And pt wife stated that she was very disappointed that they couldn't be seen even though they were late. Reassured pt wife that pt was resched to Friday and if she has any concerns she can discuss it with the Dr.

## 2024-02-23 NOTE — Transitions of Care (Post Inpatient/ED Visit) (Signed)
 02/23/2024  Name: [name] MRN: [id number] DOB: [birth year]  Today's TOC FU Call Status: Today's TOC FU Call Status:: Successful TOC FU Call Completed TOC FU Call Complete Date: 02/23/24  Patient's Name and Date of Birth confirmed. Name, DOB  Transition Care Management Follow-up Telephone Call Date of Discharge: 02/22/24 Discharge Facility: Other [facility]) Name of Other ([facility]) Discharge Facility: [facility] Type of Discharge: Inpatient Admission Primary Inpatient Discharge Diagnosis:: fracture femur How have you been since you were released from the hospital?: Better Any questions or concerns?: No  Items Reviewed: Did you receive and understand the discharge instructions provided?: Yes Medications obtained,verified, and reconciled?: Yes (Medications Reviewed) Any new allergies since your discharge?: No Dietary orders reviewed?: Yes Do you have support at home?: Yes People in Home [RPT]: spouse  Medications Reviewed Today: Medications Reviewed Today     Reviewed by Emmitt Pan, LPN (Licensed Practical Nurse) on 02/23/24 at [phone]  Med List Status: <None>   Medication Order Taking? Sig Documenting Provider Last Dose Status Informant  acetaminophen  (TYLENOL ) 500 MG tablet [id number] Yes Take 1,000 mg by mouth every 6 (six) hours as needed for mild pain (pain score 1-3) or moderate pain (pain score 4-6). [provider]  Active Self, Pharmacy Records  alfuzosin  (UROXATRAL ) 10 MG 24 hr tablet [id number] Yes TAKE 1 TABLET (10 MG TOTAL) BY MOUTH DAILY WITH BREAKFAST. Jordan, [name], MD  Active Self, Pharmacy Records  Blood Glucose Monitoring Suppl (ACCU-CHEK AVIVA CONNECT) w/Device PRESSLEY [id number] Yes 1 Device by Does not apply route daily. Jordan, [name], MD  Active Self, Pharmacy Records  Blood Glucose Monitoring Suppl (ONE TOUCH ULTRA 2) w/Device KIT [id number] Yes Daily as directed. Jordan, [name], MD  Active Self, Pharmacy Records  Cyanocobalamin   (B-12) 1000 MCG TABS [id number] Yes Take 1,000 mcg by mouth daily. [provider]  Active Self, Pharmacy Records  diltiazem (CARDIZEM CD) 240 MG 24 hr capsule [id number] Yes Take 1 capsule (240 mg total) by mouth daily. Jonel Lonni SQUIBB, MD  Active   feeding supplement (ENSURE PLUS HIGH PROTEIN) LIQD [id number] Yes Take 237 mLs by mouth 2 (two) times daily between meals. Jonel Lonni SQUIBB, MD  Active   finasteride  (PROSCAR ) 5 MG tablet [id number] Yes TAKE 1 TABLET BY MOUTH EVERY DAY Jordan, [name], MD  Active Self, Pharmacy Records  furosemide  (LASIX ) 40 MG tablet [id number] Yes Take 1 tablet (40 mg total) by mouth daily. Jonel Lonni SQUIBB, MD  Active   glipiZIDE  (GLUCOTROL ) 5 MG tablet [id number] Yes TAKE 1 TABLET BY MOUTH DAILY BEFORE BREAKFAST. 20-30 MINUTES BEFORE MEAL. Jordan, [name], MD  Active Self, Pharmacy Records  HYDROcodone -acetaminophen  (NORCO/VICODIN) 5-325 MG tablet [id number] Yes Take 1-2 tablets by mouth every 4 (four) hours as needed for moderate pain (pain score 4-6) or severe pain (pain score 7-10). Danford, Lonni SQUIBB, MD  Active   Ipratropium-Albuterol  (COMBIVENT  RESPIMAT) 20-100 MCG/ACT AERS respimat [id number] Yes Inhale 1 puff into the lungs every 6 (six) hours as needed for wheezing. Jordan, [name], MD  Active Self, Pharmacy Records  isosorbide  mononitrate (IMDUR ) 30 MG 24 hr tablet [id number] Yes TAKE 1 TABLET BY MOUTH EVERY DAY Jordan, [name], MD  Active Self, Pharmacy Records  Lancet Devices [facility] DEVICE WITH EJECTOR) [place] [id number] Yes 1 Device by Does not apply route daily. Jordan, [name], MD  Active Self, Pharmacy Records  loperamide (IMODIUM A-D) 2 MG tablet [id number] Yes Take 2 mg by mouth 4 (four) times daily as needed  for diarrhea or loose stools. [provider]  Active Self, Pharmacy Records  nitroGLYCERIN  (NITROSTAT ) 0.4 MG SL tablet [id number]  Place 1 tablet (0.4 mg total) under the tongue every 5 (five) minutes as needed for  chest pain.  Patient not taking: Reported on 02/23/2024   Jordan, [name], MD  Active Self, Pharmacy Records  Nutritional Supplements (,FEEDING SUPPLEMENT, PROSOURCE PLUS) liquid [id number] Yes Take 30 mLs by mouth 2 (two) times daily between meals. Jonel Lonni SQUIBB, MD  Active   OneTouch Delica Lancets 33G [place] [id number] Yes USE TO CHECK BLOOD SUGAR DAILY AND PRN Jordan, [name], MD  Active Self, Pharmacy Records  [facility] ULTRA test strip [id number] Yes USE TO CHECK BLOOD SUGAR ONCE DAILY E11.9 Jordan, [name], MD  Active Self, Pharmacy Records  QUEtiapine (SEROQUEL) 25 MG tablet [id number] Yes Take 1 tablet (25 mg total) by mouth at bedtime for 5 days. Jonel Lonni SQUIBB, MD  Active   tizanidine  (ZANAFLEX ) 2 MG capsule [id number] Yes TAKE 4 CAPSULES (8 MG TOTAL) BY MOUTH AT BEDTIME AS NEEDED FOR MUSCLE SPASMS. Jordan, [name], MD  Active Self, Pharmacy Records  valsartan  (DIOVAN ) 80 MG tablet [id number]  TAKE 1 TABLET BY MOUTH EVERY DAY  Patient not taking: Reported on 02/23/2024   Jordan, [name], MD  Active Self, Pharmacy Records  Vitamin D, Ergocalciferol, (DRISDOL) 1.25 MG (50000 UNIT) CAPS capsule [id number] Yes Take 1 capsule (50,000 Units total) by mouth every 7 (seven) days. Danton Lauraine DELENA DEVONNA  Active             Home Care and Equipment/Supplies: Were Home Health Services Ordered?: Yes Has Agency set up a time to come to your home?: No Any new equipment or medical supplies ordered?: Yes Name of Medical supply agency?: unknown Were you able to get the equipment/medical supplies?: Yes Do you have any questions related to the use of the equipment/supplies?: No  Functional Questionnaire: Do you need assistance with bathing/showering or dressing?: Yes Do you need assistance with meal preparation?: Yes Do you need assistance with eating?: No Do you have difficulty maintaining continence: No Do you need assistance with getting out of bed/getting out of a chair/moving?:  Yes  Follow up appointments reviewed: PCP Follow-up appointment confirmed?: Yes Date of PCP follow-up appointment?: 02/23/24 Follow-up Provider: [facility] Follow-up appointment confirmed?: NA Do you need transportation to your follow-up appointment?: No Do you understand care options if your condition(s) worsen?: Yes-patient verbalized understanding    SIGNATURE Julian Lemmings, LPN [facility] Nurse Health Advisor Direct Dial [phone]

## 2024-02-23 NOTE — Telephone Encounter (Signed)
 FYI Only or Action Required?: FYI only for provider: instructions on how to take pain meds advised.  Patient was last seen in primary care on 09/22/2023 by Jordan, [name], MD.  Called Nurse Triage reporting Advice Only.  Symptoms began several weeks ago.  Interventions attempted: Prescription medications: hydrocodone .  Symptoms are: unchanged.  Triage Disposition: Information or Advice Only Call  Patient/caregiver understands and will follow disposition?: Yes   Copied from CRM [id number]. Topic: Clinical - Red Word Triage >> Feb 23, 2024  4:30 PM Tinnie BROCKS wrote: Red Word that prompted transfer to Nurse Triage: Wife [facility] on the line, on dpr. Requesting to speak with nurse because pt is in severe pain, wants to know if he can take anything besides hydrocodone . Was in ED for femur fracture. Was supposed to have appt today but was 18 mins late and was no showed. Reason for Disposition  Health information question, no triage required and triager able to answer question  Answer Assessment - Initial Assessment Questions 1. REASON FOR CALL: What is the main reason for your call? or How can I best help you?     Pt's wife was very upset about how she was treated today at an appt for her husband. She states she spoke to the office manager who was aggressive with her and wouldn't hear her out. She stated it's not right that the patient's have to abide by the rules but the doctors don't. She wanted to know if he can take anything else for the pain like tylenol  or ibuprofen. RN advised he cannot take tylenol  because it is already in his pain medication. Upon chart review. Pt is also on eliquis  so advised can't take ibuprofen. RN read wife the instructions for the pain medication. She states she didn't realize that he could take 2 at one time. RN advised instructions say if his pain is 7 or greater he can. She stated understanding and appreciation for the information. Rn did also advise ice can  help with the pain as well.  2. SYMPTOMS : Do you have any symptoms?      Pain to be expected post surgery- she is also going to call surgeon in the am.  Protocols used: Information Only Call - No Triage-A-AH

## 2024-02-24 NOTE — Progress Notes (Unsigned)
 HPI:  [name] is a 89 y.o. male, who is here today to follow on recent OV/ED visit.  Hospitalized from 02/06/24 and discharged to a SNF 02/17/24.*** Discharged home on 02/22/24. TOC call on 02/23/24. *** Evaluated in the ED the date of admission after a fall complicated by right hip fracture. He underwent right hemiarthroplasty on 02/07/24. Post op course was complicated by pneumonia and delirium. Acute encephalopathy, he was placed on Seroquel  during hospitalization and instructed to wean it off after discharge.  Completed 5 days of abx treatment for pneumonia.  LE vas US  negative for DVT. Atrial fib: Started on Diltiazem *** and he has a f/u appt with cardiologist. HTN: Valsartan  80 mg was held. *** HFpEF: On Furosemide  *** DM II: Glipizide  resumed after hospital discharge. Lab Results  Component Value Date   HGBA1C 6.8 (H) 02/07/2024   Lab Results  Component Value Date   NA 143 02/13/2024   CL 103 02/13/2024   K 4.5 02/13/2024   CO2 27 02/13/2024   BUN 26 (H) 02/13/2024   CREATININE 0.83 02/13/2024   GFRNONAA >60 02/13/2024   CALCIUM  8.9 02/13/2024   PHOS 2.7 02/11/2024   ALBUMIN 2.6 (L) 02/11/2024   GLUCOSE 335 (H) 02/13/2024   Lab Results  Component Value Date   WBC 9.5 02/13/2024   HGB 13.8 02/13/2024   HCT 42.4 02/13/2024   MCV 94.6 02/13/2024   PLT 242 02/13/2024   Review of Systems See other pertinent positives and negatives in HPI.  Current Outpatient Medications on File Prior to Visit  Medication Sig Dispense Refill   acetaminophen  (TYLENOL ) 500 MG tablet Take 1,000 mg by mouth every 6 (six) hours as needed for mild pain (pain score 1-3) or moderate pain (pain score 4-6).     alfuzosin  (UROXATRAL ) 10 MG 24 hr tablet TAKE 1 TABLET (10 MG TOTAL) BY MOUTH DAILY WITH BREAKFAST. 90 tablet 2   Blood Glucose Monitoring Suppl (ACCU-CHEK AVIVA CONNECT) w/Device KIT 1 Device by Does not apply route daily. 1 kit 0   Blood Glucose Monitoring Suppl (ONE  TOUCH ULTRA 2) w/Device KIT Daily as directed. 1 kit 0   Cyanocobalamin  (B-12) 1000 MCG TABS Take 1,000 mcg by mouth daily.     diltiazem (CARDIZEM CD) 240 MG 24 hr capsule Take 1 capsule (240 mg total) by mouth daily.     feeding supplement (ENSURE PLUS HIGH PROTEIN) LIQD Take 237 mLs by mouth 2 (two) times daily between meals.     finasteride  (PROSCAR ) 5 MG tablet TAKE 1 TABLET BY MOUTH EVERY DAY 90 tablet 2   furosemide  (LASIX ) 40 MG tablet Take 1 tablet (40 mg total) by mouth daily.     glipiZIDE  (GLUCOTROL ) 5 MG tablet TAKE 1 TABLET BY MOUTH DAILY BEFORE BREAKFAST. 20-30 MINUTES BEFORE MEAL. 90 tablet 1   HYDROcodone -acetaminophen  (NORCO/VICODIN) 5-325 MG tablet Take 1-2 tablets by mouth every 4 (four) hours as needed for moderate pain (pain score 4-6) or severe pain (pain score 7-10). 40 tablet 0   Ipratropium-Albuterol  (COMBIVENT  RESPIMAT) 20-100 MCG/ACT AERS respimat Inhale 1 puff into the lungs every 6 (six) hours as needed for wheezing. 4 g 1   isosorbide  mononitrate (IMDUR ) 30 MG 24 hr tablet TAKE 1 TABLET BY MOUTH EVERY DAY 90 tablet 2   Lancet Devices (LANCET DEVICE WITH EJECTOR) MISC 1 Device by Does not apply route daily. 1 each 1   loperamide (IMODIUM A-D) 2 MG tablet Take 2 mg by mouth 4 (four)  times daily as needed for diarrhea or loose stools.     nitroGLYCERIN  (NITROSTAT ) 0.4 MG SL tablet Place 1 tablet (0.4 mg total) under the tongue every 5 (five) minutes as needed for chest pain. (Patient not taking: Reported on 02/23/2024) 50 tablet 1   Nutritional Supplements (,FEEDING SUPPLEMENT, PROSOURCE PLUS) liquid Take 30 mLs by mouth 2 (two) times daily between meals.     OneTouch Delica Lancets 33G MISC USE TO CHECK BLOOD SUGAR DAILY AND PRN 100 each 4   ONETOUCH ULTRA test strip USE TO CHECK BLOOD SUGAR ONCE DAILY E11.9 100 strip 4   QUEtiapine (SEROQUEL) 25 MG tablet Take 1 tablet (25 mg total) by mouth at bedtime for 5 days.     tizanidine  (ZANAFLEX ) 2 MG capsule TAKE 4 CAPSULES  (8 MG TOTAL) BY MOUTH AT BEDTIME AS NEEDED FOR MUSCLE SPASMS. 360 capsule 1   [Paused] valsartan  (DIOVAN ) 80 MG tablet TAKE 1 TABLET BY MOUTH EVERY DAY (Patient not taking: Reported on 02/23/2024) 90 tablet 3   Vitamin D, Ergocalciferol, (DRISDOL) 1.25 MG (50000 UNIT) CAPS capsule Take 1 capsule (50,000 Units total) by mouth every 7 (seven) days. 5 capsule 0   No current facility-administered medications on file prior to visit.    Past Medical History:  Diagnosis Date   BACK PAIN, CHRONIC 04/18/2009   BENIGN PROSTATIC HYPERTROPHY, HX OF 01/19/2007   Carpal tunnel syndrome 03/24/2007   CORONARY ARTERY DISEASE 01/19/2007   DEPRESSION, CHRONIC 06/24/2007   DIABETES MELLITUS 01/19/2007   DIABETES MELLITUS, TYPE II, WITH NEUROLOGICAL COMPLICATIONS 06/17/2010   Diarrhea 02/23/2007   HEART MURMUR, SYSTOLIC 01/02/2008   HERPES SIMPLEX INFECTION 01/19/2007   HYPERLIPIDEMIA 01/19/2007   HYPERTENSION 01/19/2007   Intermediate coronary syndrome (HCC) 08/29/2008   LEG CRAMPS, NOCTURNAL 10/28/2007   Leg cramps, sleep related 08/05/2010   OBSTRUCTIVE SLEEP APNEA 01/19/2007   does not use CPAP   OSTEOARTHRITIS 06/24/2007   Other postprocedural status(V45.89) 01/19/2007   PERCUTANEOUS TRANSLUMINAL CORONARY ANGIOPLASTY, HX OF 01/19/2007   PLANTAR FASCIITIS, RIGHT 01/19/2007   SHOULDER PAIN, LEFT 03/24/2007   SPINAL STENOSIS, LUMBAR 06/03/2009   TINEA PEDIS 11/15/2009   URI 01/11/2009   No Known Allergies  Social History   Socioeconomic History   Marital status: Married    Spouse name: Not on file   Number of children: Not on file   Years of education: Not on file   Highest education level: 12th grade  Occupational History   Occupation: retired    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  Tobacco Use   Smoking status: Former    Current packs/day: 0.00    Types: Cigarettes    Quit date: 04/14/1983    Years since quitting: 40.8   Smokeless tobacco: Never   Tobacco comments:    stopped 1985  Vaping Use   Vaping  status: Never Used  Substance and Sexual Activity   Alcohol use: Yes    Comment: OCCASSIONAL   Drug use: Not Currently    Types: Marijuana    Comment: occ   Sexual activity: Not on file  Other Topics Concern   Not on file  Social History Narrative   Married in [place] - 92yrs, divorced; married 1974 - 1.5 years, divorced; married 1985 1 daughter 2; 56 step daughters; 2 grandchildren.   Social Drivers of Corporate Investment Banker Strain: Low Risk  (02/16/2022)   Overall Financial Resource Strain (CARDIA)    Difficulty of Paying Living Expenses: Not hard at all  Food Insecurity:  No Food Insecurity (02/07/2024)   Hunger Vital Sign    Worried About Running Out of Food in the Last Year: Never true    Ran Out of Food in the Last Year: Never true  Transportation Needs: No Transportation Needs (02/07/2024)   PRAPARE - Administrator, Civil Service (Medical): No    Lack of Transportation (Non-Medical): No  Physical Activity: Insufficiently Active (02/16/2022)   Exercise Vital Sign    Days of Exercise per Week: 1 day    Minutes of Exercise per Session: 10 min  Stress: Stress Concern Present (02/16/2022)   [employer] of Occupational Health - Occupational Stress Questionnaire    Feeling of Stress : To some extent  Social Connections: Moderately Isolated (02/10/2024)   Social Connection and Isolation Panel    Frequency of Communication with Friends and Family: Three times a week    Frequency of Social Gatherings with Friends and Family: Once a week    Attends Religious Services: Never    Database Administrator or Organizations: No    Attends Banker Meetings: Never    Marital Status: Married    There were no vitals filed for this visit. There is no height or weight on file to calculate BMI.  Physical Exam  ASSESSMENT AND PLAN:  There are no diagnoses linked to this encounter.  No orders of the defined types were placed in this encounter.   No  problem-specific Assessment & Plan notes found for this encounter.   No follow-ups on file.  Sequoia Mincey G. Kyanne Rials, MD  [facility]. [place] office.

## 2024-02-25 ENCOUNTER — Encounter: Payer: Self-pay | Admitting: Family Medicine

## 2024-02-25 ENCOUNTER — Inpatient Hospital Stay: Admitting: Family Medicine

## 2024-02-25 ENCOUNTER — Ambulatory Visit: Admitting: Family Medicine

## 2024-02-25 VITALS — BP 110/70 | HR 73 | Temp 97.7°F | Resp 16 | Ht 63.0 in | Wt 147.0 lb

## 2024-02-25 DIAGNOSIS — S72001D Fracture of unspecified part of neck of right femur, subsequent encounter for closed fracture with routine healing: Secondary | ICD-10-CM | POA: Diagnosis not present

## 2024-02-25 DIAGNOSIS — N401 Enlarged prostate with lower urinary tract symptoms: Secondary | ICD-10-CM | POA: Diagnosis not present

## 2024-02-25 DIAGNOSIS — N138 Other obstructive and reflux uropathy: Secondary | ICD-10-CM | POA: Diagnosis not present

## 2024-02-25 DIAGNOSIS — I119 Hypertensive heart disease without heart failure: Secondary | ICD-10-CM | POA: Diagnosis not present

## 2024-02-25 DIAGNOSIS — G9341 Metabolic encephalopathy: Secondary | ICD-10-CM | POA: Diagnosis not present

## 2024-02-25 DIAGNOSIS — Z96641 Presence of right artificial hip joint: Secondary | ICD-10-CM

## 2024-02-25 DIAGNOSIS — Z471 Aftercare following joint replacement surgery: Secondary | ICD-10-CM | POA: Diagnosis not present

## 2024-02-25 DIAGNOSIS — G4733 Obstructive sleep apnea (adult) (pediatric): Secondary | ICD-10-CM | POA: Diagnosis not present

## 2024-02-25 DIAGNOSIS — J449 Chronic obstructive pulmonary disease, unspecified: Secondary | ICD-10-CM | POA: Diagnosis not present

## 2024-02-25 DIAGNOSIS — I4891 Unspecified atrial fibrillation: Secondary | ICD-10-CM | POA: Diagnosis not present

## 2024-02-25 DIAGNOSIS — Z8701 Personal history of pneumonia (recurrent): Secondary | ICD-10-CM | POA: Diagnosis not present

## 2024-02-25 DIAGNOSIS — E1149 Type 2 diabetes mellitus with other diabetic neurological complication: Secondary | ICD-10-CM

## 2024-02-25 DIAGNOSIS — R350 Frequency of micturition: Secondary | ICD-10-CM | POA: Diagnosis not present

## 2024-02-25 DIAGNOSIS — E1140 Type 2 diabetes mellitus with diabetic neuropathy, unspecified: Secondary | ICD-10-CM

## 2024-02-25 DIAGNOSIS — Z7984 Long term (current) use of oral hypoglycemic drugs: Secondary | ICD-10-CM

## 2024-02-25 DIAGNOSIS — Z9181 History of falling: Secondary | ICD-10-CM | POA: Diagnosis not present

## 2024-02-25 DIAGNOSIS — I110 Hypertensive heart disease with heart failure: Secondary | ICD-10-CM | POA: Diagnosis not present

## 2024-02-25 DIAGNOSIS — E785 Hyperlipidemia, unspecified: Secondary | ICD-10-CM | POA: Diagnosis not present

## 2024-02-25 DIAGNOSIS — E1142 Type 2 diabetes mellitus with diabetic polyneuropathy: Secondary | ICD-10-CM | POA: Diagnosis not present

## 2024-02-25 DIAGNOSIS — I25119 Atherosclerotic heart disease of native coronary artery with unspecified angina pectoris: Secondary | ICD-10-CM | POA: Diagnosis not present

## 2024-02-25 DIAGNOSIS — E440 Moderate protein-calorie malnutrition: Secondary | ICD-10-CM | POA: Diagnosis not present

## 2024-02-25 DIAGNOSIS — I5032 Chronic diastolic (congestive) heart failure: Secondary | ICD-10-CM | POA: Diagnosis not present

## 2024-02-25 DIAGNOSIS — R3914 Feeling of incomplete bladder emptying: Secondary | ICD-10-CM | POA: Diagnosis not present

## 2024-02-25 MED ORDER — DOXYCYCLINE HYCLATE 100 MG PO TABS: 100.0000 mg | ORAL_TABLET | Freq: Two times a day (BID) | ORAL | 0 refills | Status: AC

## 2024-02-25 MED ORDER — HYDROCODONE-ACETAMINOPHEN 5-325 MG PO TABS: 1.0000 | ORAL_TABLET | Freq: Four times a day (QID) | ORAL | 0 refills | Status: DC | PRN

## 2024-02-25 MED ORDER — DILTIAZEM HCL ER COATED BEADS 240 MG PO CP24: 240.0000 mg | ORAL_CAPSULE | Freq: Every day | ORAL | 1 refills | Status: AC

## 2024-02-25 NOTE — Telephone Encounter (Signed)
 Pt was seen today with Dr. Jordan.

## 2024-02-25 NOTE — Assessment & Plan Note (Signed)
 Last hemoglobin A1c 6.8 on 02/07/2024. Continue glipizide  5 mg before his bigger meal. Caution with hypoglycemia. Continue monitoring BS.

## 2024-02-25 NOTE — Patient Instructions (Addendum)
 A few things to remember from today's visit:  S/P total right hip arthroplasty  Atrial fibrillation with RVR (HCC)  Type 2 diabetes mellitus with diabetic neuropathy, without long-term current use of insulin  (HCC)  Doxycycline  to cover for small area of surgical wound with redness. Eliquis  5 mg 2 tiems daily to continue. Resume Diltiazem. Keep appt with cardio and orthopedist.  If you need refills for medications you take chronically, please call your pharmacy. Do not use My Chart to request refills or for acute issues that need immediate attention. If you send a my chart message, it may take a few days to be addressed, specially if I am not in the office.  Please be sure medication list is accurate. If a new problem present, please set up appointment sooner than planned today.

## 2024-02-25 NOTE — Assessment & Plan Note (Signed)
 BP adequately controlled. He took valsartan  80 mg yesterday, it was discontinued during hospitalization. Recommend resuming diltiazem 240 mg daily and holding on valsartan  for now. Continue monitoring BP regularly. Follow-up in 2 months.

## 2024-02-25 NOTE — Assessment & Plan Note (Signed)
 No rhythm or rate control. Recommend resuming diltiazem 240 mg daily. Eliquis  5 mg twice daily to continue, samples given. Stressed the importance of keeping appointment with cardiologist on 03/08/2024.

## 2024-02-25 NOTE — Telephone Encounter (Signed)
 According to prescription instructions, he is supposed to take medication every 4 hours as needed. Thanks, BJ

## 2024-02-28 ENCOUNTER — Telehealth: Payer: Self-pay | Admitting: *Deleted

## 2024-02-28 DIAGNOSIS — S72041D Displaced fracture of base of neck of right femur, subsequent encounter for closed fracture with routine healing: Secondary | ICD-10-CM | POA: Diagnosis not present

## 2024-02-28 NOTE — Telephone Encounter (Signed)
 Copied from CRM [id number]. Topic: Clinical - Home Health Verbal Orders >> Feb 28, 2024  4:22 PM China J wrote: Caller/Agency: [facility] / [facility] Callback Number: [phone] / Secured voice mail box. Service Requested: Skilled Nursing Frequency: 1 week 5 with 2 PRNs Any new concerns about the patient? No

## 2024-02-29 DIAGNOSIS — E1142 Type 2 diabetes mellitus with diabetic polyneuropathy: Secondary | ICD-10-CM | POA: Diagnosis not present

## 2024-02-29 DIAGNOSIS — J449 Chronic obstructive pulmonary disease, unspecified: Secondary | ICD-10-CM | POA: Diagnosis not present

## 2024-02-29 DIAGNOSIS — Z8701 Personal history of pneumonia (recurrent): Secondary | ICD-10-CM | POA: Diagnosis not present

## 2024-02-29 DIAGNOSIS — E785 Hyperlipidemia, unspecified: Secondary | ICD-10-CM | POA: Diagnosis not present

## 2024-02-29 DIAGNOSIS — I25119 Atherosclerotic heart disease of native coronary artery with unspecified angina pectoris: Secondary | ICD-10-CM | POA: Diagnosis not present

## 2024-02-29 DIAGNOSIS — N138 Other obstructive and reflux uropathy: Secondary | ICD-10-CM | POA: Diagnosis not present

## 2024-02-29 DIAGNOSIS — G9341 Metabolic encephalopathy: Secondary | ICD-10-CM | POA: Diagnosis not present

## 2024-02-29 DIAGNOSIS — G4733 Obstructive sleep apnea (adult) (pediatric): Secondary | ICD-10-CM | POA: Diagnosis not present

## 2024-02-29 DIAGNOSIS — N401 Enlarged prostate with lower urinary tract symptoms: Secondary | ICD-10-CM | POA: Diagnosis not present

## 2024-02-29 DIAGNOSIS — S72001D Fracture of unspecified part of neck of right femur, subsequent encounter for closed fracture with routine healing: Secondary | ICD-10-CM | POA: Diagnosis not present

## 2024-02-29 DIAGNOSIS — Z7984 Long term (current) use of oral hypoglycemic drugs: Secondary | ICD-10-CM | POA: Diagnosis not present

## 2024-02-29 DIAGNOSIS — R3914 Feeling of incomplete bladder emptying: Secondary | ICD-10-CM | POA: Diagnosis not present

## 2024-02-29 DIAGNOSIS — E440 Moderate protein-calorie malnutrition: Secondary | ICD-10-CM | POA: Diagnosis not present

## 2024-02-29 DIAGNOSIS — I5032 Chronic diastolic (congestive) heart failure: Secondary | ICD-10-CM | POA: Diagnosis not present

## 2024-02-29 DIAGNOSIS — Z9181 History of falling: Secondary | ICD-10-CM | POA: Diagnosis not present

## 2024-02-29 DIAGNOSIS — I110 Hypertensive heart disease with heart failure: Secondary | ICD-10-CM | POA: Diagnosis not present

## 2024-02-29 DIAGNOSIS — Z96641 Presence of right artificial hip joint: Secondary | ICD-10-CM | POA: Diagnosis not present

## 2024-02-29 DIAGNOSIS — R350 Frequency of micturition: Secondary | ICD-10-CM | POA: Diagnosis not present

## 2024-02-29 DIAGNOSIS — I4891 Unspecified atrial fibrillation: Secondary | ICD-10-CM | POA: Diagnosis not present

## 2024-02-29 NOTE — Telephone Encounter (Signed)
 Spoke with [facility]. Authorization for requested services given.

## 2024-02-29 NOTE — Telephone Encounter (Signed)
It is ok to give verbal authorization to requested services. Thanks, BJ 

## 2024-03-02 ENCOUNTER — Telehealth: Payer: Self-pay | Admitting: *Deleted

## 2024-03-02 NOTE — Telephone Encounter (Signed)
 Copied from CRM [id number]. Topic: Clinical - Home Health Verbal Orders >> Mar 02, 2024  4:05 PM Charolett L wrote: Caller/Agency: [facility] Callback Number: [phone] Service Requested: Physical Therapy Frequency: 1week 9 Any new concerns about the patient? No

## 2024-03-03 DIAGNOSIS — I25119 Atherosclerotic heart disease of native coronary artery with unspecified angina pectoris: Secondary | ICD-10-CM | POA: Diagnosis not present

## 2024-03-03 DIAGNOSIS — N401 Enlarged prostate with lower urinary tract symptoms: Secondary | ICD-10-CM | POA: Diagnosis not present

## 2024-03-03 DIAGNOSIS — I4891 Unspecified atrial fibrillation: Secondary | ICD-10-CM | POA: Diagnosis not present

## 2024-03-03 DIAGNOSIS — Z96641 Presence of right artificial hip joint: Secondary | ICD-10-CM | POA: Diagnosis not present

## 2024-03-03 DIAGNOSIS — I5032 Chronic diastolic (congestive) heart failure: Secondary | ICD-10-CM | POA: Diagnosis not present

## 2024-03-03 DIAGNOSIS — E785 Hyperlipidemia, unspecified: Secondary | ICD-10-CM | POA: Diagnosis not present

## 2024-03-03 DIAGNOSIS — E440 Moderate protein-calorie malnutrition: Secondary | ICD-10-CM | POA: Diagnosis not present

## 2024-03-03 DIAGNOSIS — S72001D Fracture of unspecified part of neck of right femur, subsequent encounter for closed fracture with routine healing: Secondary | ICD-10-CM | POA: Diagnosis not present

## 2024-03-03 DIAGNOSIS — E1142 Type 2 diabetes mellitus with diabetic polyneuropathy: Secondary | ICD-10-CM | POA: Diagnosis not present

## 2024-03-03 DIAGNOSIS — I110 Hypertensive heart disease with heart failure: Secondary | ICD-10-CM | POA: Diagnosis not present

## 2024-03-03 DIAGNOSIS — Z7984 Long term (current) use of oral hypoglycemic drugs: Secondary | ICD-10-CM | POA: Diagnosis not present

## 2024-03-03 DIAGNOSIS — Z9181 History of falling: Secondary | ICD-10-CM | POA: Diagnosis not present

## 2024-03-03 DIAGNOSIS — R3914 Feeling of incomplete bladder emptying: Secondary | ICD-10-CM | POA: Diagnosis not present

## 2024-03-03 DIAGNOSIS — Z8701 Personal history of pneumonia (recurrent): Secondary | ICD-10-CM | POA: Diagnosis not present

## 2024-03-03 DIAGNOSIS — J449 Chronic obstructive pulmonary disease, unspecified: Secondary | ICD-10-CM | POA: Diagnosis not present

## 2024-03-03 DIAGNOSIS — G9341 Metabolic encephalopathy: Secondary | ICD-10-CM | POA: Diagnosis not present

## 2024-03-03 DIAGNOSIS — R350 Frequency of micturition: Secondary | ICD-10-CM | POA: Diagnosis not present

## 2024-03-03 DIAGNOSIS — G4733 Obstructive sleep apnea (adult) (pediatric): Secondary | ICD-10-CM | POA: Diagnosis not present

## 2024-03-03 DIAGNOSIS — N138 Other obstructive and reflux uropathy: Secondary | ICD-10-CM | POA: Diagnosis not present

## 2024-03-03 NOTE — Telephone Encounter (Signed)
 Verbal authorization to requested services can be given. Thanks, BJ

## 2024-03-03 NOTE — Telephone Encounter (Signed)
 Verbal authorization given to [facility] for requested services.

## 2024-03-06 ENCOUNTER — Telehealth: Payer: Self-pay

## 2024-03-06 DIAGNOSIS — Z96641 Presence of right artificial hip joint: Secondary | ICD-10-CM | POA: Diagnosis not present

## 2024-03-06 DIAGNOSIS — I4891 Unspecified atrial fibrillation: Secondary | ICD-10-CM | POA: Diagnosis not present

## 2024-03-06 DIAGNOSIS — Z9181 History of falling: Secondary | ICD-10-CM | POA: Diagnosis not present

## 2024-03-06 DIAGNOSIS — G4733 Obstructive sleep apnea (adult) (pediatric): Secondary | ICD-10-CM | POA: Diagnosis not present

## 2024-03-06 DIAGNOSIS — I110 Hypertensive heart disease with heart failure: Secondary | ICD-10-CM | POA: Diagnosis not present

## 2024-03-06 DIAGNOSIS — E440 Moderate protein-calorie malnutrition: Secondary | ICD-10-CM | POA: Diagnosis not present

## 2024-03-06 DIAGNOSIS — Z7984 Long term (current) use of oral hypoglycemic drugs: Secondary | ICD-10-CM | POA: Diagnosis not present

## 2024-03-06 DIAGNOSIS — Z8701 Personal history of pneumonia (recurrent): Secondary | ICD-10-CM | POA: Diagnosis not present

## 2024-03-06 DIAGNOSIS — R350 Frequency of micturition: Secondary | ICD-10-CM | POA: Diagnosis not present

## 2024-03-06 DIAGNOSIS — I5032 Chronic diastolic (congestive) heart failure: Secondary | ICD-10-CM | POA: Diagnosis not present

## 2024-03-06 DIAGNOSIS — R3914 Feeling of incomplete bladder emptying: Secondary | ICD-10-CM | POA: Diagnosis not present

## 2024-03-06 DIAGNOSIS — E785 Hyperlipidemia, unspecified: Secondary | ICD-10-CM | POA: Diagnosis not present

## 2024-03-06 DIAGNOSIS — S72001D Fracture of unspecified part of neck of right femur, subsequent encounter for closed fracture with routine healing: Secondary | ICD-10-CM | POA: Diagnosis not present

## 2024-03-06 DIAGNOSIS — N138 Other obstructive and reflux uropathy: Secondary | ICD-10-CM | POA: Diagnosis not present

## 2024-03-06 DIAGNOSIS — J449 Chronic obstructive pulmonary disease, unspecified: Secondary | ICD-10-CM | POA: Diagnosis not present

## 2024-03-06 DIAGNOSIS — E1142 Type 2 diabetes mellitus with diabetic polyneuropathy: Secondary | ICD-10-CM | POA: Diagnosis not present

## 2024-03-06 DIAGNOSIS — G9341 Metabolic encephalopathy: Secondary | ICD-10-CM | POA: Diagnosis not present

## 2024-03-06 DIAGNOSIS — N401 Enlarged prostate with lower urinary tract symptoms: Secondary | ICD-10-CM | POA: Diagnosis not present

## 2024-03-06 DIAGNOSIS — I25119 Atherosclerotic heart disease of native coronary artery with unspecified angina pectoris: Secondary | ICD-10-CM | POA: Diagnosis not present

## 2024-03-06 NOTE — Telephone Encounter (Signed)
 Copied from CRM #[id number]. Topic: Clinical - Home Health Verbal Orders >> Mar 06, 2024  8:40 AM Cleave MATSU wrote: Caller/Agency: [facility] Callback Number: [phone] Service Requested: Physical Therapy Frequency: 1 week 1 , 2 week 2 , 1 week 6 for strengthening , balance and gate training Any new concerns about the patient? No

## 2024-03-06 NOTE — Telephone Encounter (Signed)
 Requested services can be authorized. In regards to left eye redness, continue monitoring for new symptoms. Could try natural tears OTC as needed. Thanks, BJ

## 2024-03-06 NOTE — Telephone Encounter (Signed)
It is ok to give verbal authorization for requested services. Thanks, BJ 

## 2024-03-06 NOTE — Telephone Encounter (Signed)
 Attempted to call phone number provided. Unable to leave voicemail. Will attempt again.

## 2024-03-06 NOTE — Telephone Encounter (Signed)
 Please advise.  Copied from CRM [id number]. Topic: Clinical - Home Health Verbal Orders >> Mar 06, 2024 12:00 PM Rosina BIRCH wrote: Caller/Agency: karen from [facility] Callback Number: [phone] (secure line) Service Requested: Occupational Therapy Frequency: one week seven Any new concerns about the patient? Yes he has a redness on his left eye and it has been like that for the past two days but no complaints on pain or vision changes

## 2024-03-06 NOTE — Telephone Encounter (Signed)
Left voicemail for [name] to return my call.

## 2024-03-07 NOTE — Telephone Encounter (Signed)
 Verbal authorization given to PT per phone number in message.

## 2024-03-07 NOTE — Telephone Encounter (Signed)
 Authorization given to [facility] for requested services. Attempted to reach patient regarding OTC natural tears per Dr. Gib recommendation. Left voicemail for patient to return call.

## 2024-03-08 ENCOUNTER — Ambulatory Visit: Admitting: Cardiology

## 2024-03-10 DIAGNOSIS — N138 Other obstructive and reflux uropathy: Secondary | ICD-10-CM | POA: Diagnosis not present

## 2024-03-10 DIAGNOSIS — Z9181 History of falling: Secondary | ICD-10-CM | POA: Diagnosis not present

## 2024-03-10 DIAGNOSIS — I5032 Chronic diastolic (congestive) heart failure: Secondary | ICD-10-CM | POA: Diagnosis not present

## 2024-03-10 DIAGNOSIS — G4733 Obstructive sleep apnea (adult) (pediatric): Secondary | ICD-10-CM | POA: Diagnosis not present

## 2024-03-10 DIAGNOSIS — I4891 Unspecified atrial fibrillation: Secondary | ICD-10-CM | POA: Diagnosis not present

## 2024-03-10 DIAGNOSIS — N401 Enlarged prostate with lower urinary tract symptoms: Secondary | ICD-10-CM | POA: Diagnosis not present

## 2024-03-10 DIAGNOSIS — I25119 Atherosclerotic heart disease of native coronary artery with unspecified angina pectoris: Secondary | ICD-10-CM | POA: Diagnosis not present

## 2024-03-10 DIAGNOSIS — Z96641 Presence of right artificial hip joint: Secondary | ICD-10-CM | POA: Diagnosis not present

## 2024-03-10 DIAGNOSIS — R350 Frequency of micturition: Secondary | ICD-10-CM | POA: Diagnosis not present

## 2024-03-10 DIAGNOSIS — J449 Chronic obstructive pulmonary disease, unspecified: Secondary | ICD-10-CM | POA: Diagnosis not present

## 2024-03-10 DIAGNOSIS — Z8701 Personal history of pneumonia (recurrent): Secondary | ICD-10-CM | POA: Diagnosis not present

## 2024-03-10 DIAGNOSIS — S72001D Fracture of unspecified part of neck of right femur, subsequent encounter for closed fracture with routine healing: Secondary | ICD-10-CM | POA: Diagnosis not present

## 2024-03-10 DIAGNOSIS — G9341 Metabolic encephalopathy: Secondary | ICD-10-CM | POA: Diagnosis not present

## 2024-03-10 DIAGNOSIS — E1142 Type 2 diabetes mellitus with diabetic polyneuropathy: Secondary | ICD-10-CM | POA: Diagnosis not present

## 2024-03-10 DIAGNOSIS — R3914 Feeling of incomplete bladder emptying: Secondary | ICD-10-CM | POA: Diagnosis not present

## 2024-03-10 DIAGNOSIS — I110 Hypertensive heart disease with heart failure: Secondary | ICD-10-CM | POA: Diagnosis not present

## 2024-03-10 DIAGNOSIS — Z7984 Long term (current) use of oral hypoglycemic drugs: Secondary | ICD-10-CM | POA: Diagnosis not present

## 2024-03-10 DIAGNOSIS — E440 Moderate protein-calorie malnutrition: Secondary | ICD-10-CM | POA: Diagnosis not present

## 2024-03-10 DIAGNOSIS — E785 Hyperlipidemia, unspecified: Secondary | ICD-10-CM | POA: Diagnosis not present

## 2024-03-13 DIAGNOSIS — I5032 Chronic diastolic (congestive) heart failure: Secondary | ICD-10-CM | POA: Diagnosis not present

## 2024-03-13 DIAGNOSIS — N401 Enlarged prostate with lower urinary tract symptoms: Secondary | ICD-10-CM | POA: Diagnosis not present

## 2024-03-13 DIAGNOSIS — R3914 Feeling of incomplete bladder emptying: Secondary | ICD-10-CM | POA: Diagnosis not present

## 2024-03-13 DIAGNOSIS — Z96641 Presence of right artificial hip joint: Secondary | ICD-10-CM | POA: Diagnosis not present

## 2024-03-13 DIAGNOSIS — I25119 Atherosclerotic heart disease of native coronary artery with unspecified angina pectoris: Secondary | ICD-10-CM | POA: Diagnosis not present

## 2024-03-13 DIAGNOSIS — I4891 Unspecified atrial fibrillation: Secondary | ICD-10-CM | POA: Diagnosis not present

## 2024-03-13 DIAGNOSIS — R350 Frequency of micturition: Secondary | ICD-10-CM | POA: Diagnosis not present

## 2024-03-13 DIAGNOSIS — G9341 Metabolic encephalopathy: Secondary | ICD-10-CM | POA: Diagnosis not present

## 2024-03-13 DIAGNOSIS — S72001D Fracture of unspecified part of neck of right femur, subsequent encounter for closed fracture with routine healing: Secondary | ICD-10-CM | POA: Diagnosis not present

## 2024-03-13 DIAGNOSIS — E1142 Type 2 diabetes mellitus with diabetic polyneuropathy: Secondary | ICD-10-CM | POA: Diagnosis not present

## 2024-03-13 DIAGNOSIS — N138 Other obstructive and reflux uropathy: Secondary | ICD-10-CM | POA: Diagnosis not present

## 2024-03-13 DIAGNOSIS — E440 Moderate protein-calorie malnutrition: Secondary | ICD-10-CM | POA: Diagnosis not present

## 2024-03-13 DIAGNOSIS — Z8701 Personal history of pneumonia (recurrent): Secondary | ICD-10-CM | POA: Diagnosis not present

## 2024-03-13 DIAGNOSIS — G4733 Obstructive sleep apnea (adult) (pediatric): Secondary | ICD-10-CM | POA: Diagnosis not present

## 2024-03-13 DIAGNOSIS — I110 Hypertensive heart disease with heart failure: Secondary | ICD-10-CM | POA: Diagnosis not present

## 2024-03-13 DIAGNOSIS — E785 Hyperlipidemia, unspecified: Secondary | ICD-10-CM | POA: Diagnosis not present

## 2024-03-13 DIAGNOSIS — J449 Chronic obstructive pulmonary disease, unspecified: Secondary | ICD-10-CM | POA: Diagnosis not present

## 2024-03-13 DIAGNOSIS — Z9181 History of falling: Secondary | ICD-10-CM | POA: Diagnosis not present

## 2024-03-13 DIAGNOSIS — Z7984 Long term (current) use of oral hypoglycemic drugs: Secondary | ICD-10-CM | POA: Diagnosis not present

## 2024-03-15 DIAGNOSIS — Z7984 Long term (current) use of oral hypoglycemic drugs: Secondary | ICD-10-CM | POA: Diagnosis not present

## 2024-03-15 DIAGNOSIS — R3914 Feeling of incomplete bladder emptying: Secondary | ICD-10-CM | POA: Diagnosis not present

## 2024-03-15 DIAGNOSIS — E440 Moderate protein-calorie malnutrition: Secondary | ICD-10-CM | POA: Diagnosis not present

## 2024-03-15 DIAGNOSIS — J449 Chronic obstructive pulmonary disease, unspecified: Secondary | ICD-10-CM | POA: Diagnosis not present

## 2024-03-15 DIAGNOSIS — Z96641 Presence of right artificial hip joint: Secondary | ICD-10-CM | POA: Diagnosis not present

## 2024-03-15 DIAGNOSIS — I110 Hypertensive heart disease with heart failure: Secondary | ICD-10-CM | POA: Diagnosis not present

## 2024-03-15 DIAGNOSIS — G9341 Metabolic encephalopathy: Secondary | ICD-10-CM | POA: Diagnosis not present

## 2024-03-15 DIAGNOSIS — G4733 Obstructive sleep apnea (adult) (pediatric): Secondary | ICD-10-CM | POA: Diagnosis not present

## 2024-03-15 DIAGNOSIS — N138 Other obstructive and reflux uropathy: Secondary | ICD-10-CM | POA: Diagnosis not present

## 2024-03-15 DIAGNOSIS — I4891 Unspecified atrial fibrillation: Secondary | ICD-10-CM | POA: Diagnosis not present

## 2024-03-15 DIAGNOSIS — E785 Hyperlipidemia, unspecified: Secondary | ICD-10-CM | POA: Diagnosis not present

## 2024-03-15 DIAGNOSIS — N401 Enlarged prostate with lower urinary tract symptoms: Secondary | ICD-10-CM | POA: Diagnosis not present

## 2024-03-15 DIAGNOSIS — I25119 Atherosclerotic heart disease of native coronary artery with unspecified angina pectoris: Secondary | ICD-10-CM | POA: Diagnosis not present

## 2024-03-15 DIAGNOSIS — I5032 Chronic diastolic (congestive) heart failure: Secondary | ICD-10-CM | POA: Diagnosis not present

## 2024-03-15 DIAGNOSIS — Z9181 History of falling: Secondary | ICD-10-CM | POA: Diagnosis not present

## 2024-03-15 DIAGNOSIS — R350 Frequency of micturition: Secondary | ICD-10-CM | POA: Diagnosis not present

## 2024-03-15 DIAGNOSIS — Z8701 Personal history of pneumonia (recurrent): Secondary | ICD-10-CM | POA: Diagnosis not present

## 2024-03-15 DIAGNOSIS — S72001D Fracture of unspecified part of neck of right femur, subsequent encounter for closed fracture with routine healing: Secondary | ICD-10-CM | POA: Diagnosis not present

## 2024-03-15 DIAGNOSIS — E1142 Type 2 diabetes mellitus with diabetic polyneuropathy: Secondary | ICD-10-CM | POA: Diagnosis not present

## 2024-03-16 DIAGNOSIS — I110 Hypertensive heart disease with heart failure: Secondary | ICD-10-CM | POA: Diagnosis not present

## 2024-03-16 DIAGNOSIS — R350 Frequency of micturition: Secondary | ICD-10-CM | POA: Diagnosis not present

## 2024-03-16 DIAGNOSIS — I5032 Chronic diastolic (congestive) heart failure: Secondary | ICD-10-CM | POA: Diagnosis not present

## 2024-03-16 DIAGNOSIS — N138 Other obstructive and reflux uropathy: Secondary | ICD-10-CM | POA: Diagnosis not present

## 2024-03-16 DIAGNOSIS — Z9181 History of falling: Secondary | ICD-10-CM | POA: Diagnosis not present

## 2024-03-16 DIAGNOSIS — I4891 Unspecified atrial fibrillation: Secondary | ICD-10-CM | POA: Diagnosis not present

## 2024-03-16 DIAGNOSIS — J449 Chronic obstructive pulmonary disease, unspecified: Secondary | ICD-10-CM | POA: Diagnosis not present

## 2024-03-16 DIAGNOSIS — Z7984 Long term (current) use of oral hypoglycemic drugs: Secondary | ICD-10-CM | POA: Diagnosis not present

## 2024-03-16 DIAGNOSIS — N401 Enlarged prostate with lower urinary tract symptoms: Secondary | ICD-10-CM | POA: Diagnosis not present

## 2024-03-16 DIAGNOSIS — Z96641 Presence of right artificial hip joint: Secondary | ICD-10-CM | POA: Diagnosis not present

## 2024-03-16 DIAGNOSIS — R3914 Feeling of incomplete bladder emptying: Secondary | ICD-10-CM | POA: Diagnosis not present

## 2024-03-16 DIAGNOSIS — G9341 Metabolic encephalopathy: Secondary | ICD-10-CM | POA: Diagnosis not present

## 2024-03-16 DIAGNOSIS — Z8701 Personal history of pneumonia (recurrent): Secondary | ICD-10-CM | POA: Diagnosis not present

## 2024-03-16 DIAGNOSIS — G4733 Obstructive sleep apnea (adult) (pediatric): Secondary | ICD-10-CM | POA: Diagnosis not present

## 2024-03-16 DIAGNOSIS — E785 Hyperlipidemia, unspecified: Secondary | ICD-10-CM | POA: Diagnosis not present

## 2024-03-16 DIAGNOSIS — E1142 Type 2 diabetes mellitus with diabetic polyneuropathy: Secondary | ICD-10-CM | POA: Diagnosis not present

## 2024-03-16 DIAGNOSIS — S72001D Fracture of unspecified part of neck of right femur, subsequent encounter for closed fracture with routine healing: Secondary | ICD-10-CM | POA: Diagnosis not present

## 2024-03-16 DIAGNOSIS — I25119 Atherosclerotic heart disease of native coronary artery with unspecified angina pectoris: Secondary | ICD-10-CM | POA: Diagnosis not present

## 2024-03-16 DIAGNOSIS — E440 Moderate protein-calorie malnutrition: Secondary | ICD-10-CM | POA: Diagnosis not present

## 2024-03-20 DIAGNOSIS — I110 Hypertensive heart disease with heart failure: Secondary | ICD-10-CM | POA: Diagnosis not present

## 2024-03-20 DIAGNOSIS — I5032 Chronic diastolic (congestive) heart failure: Secondary | ICD-10-CM | POA: Diagnosis not present

## 2024-03-20 DIAGNOSIS — J449 Chronic obstructive pulmonary disease, unspecified: Secondary | ICD-10-CM | POA: Diagnosis not present

## 2024-03-20 DIAGNOSIS — S72001D Fracture of unspecified part of neck of right femur, subsequent encounter for closed fracture with routine healing: Secondary | ICD-10-CM | POA: Diagnosis not present

## 2024-03-20 DIAGNOSIS — R350 Frequency of micturition: Secondary | ICD-10-CM | POA: Diagnosis not present

## 2024-03-20 DIAGNOSIS — R3914 Feeling of incomplete bladder emptying: Secondary | ICD-10-CM | POA: Diagnosis not present

## 2024-03-20 DIAGNOSIS — Z9181 History of falling: Secondary | ICD-10-CM | POA: Diagnosis not present

## 2024-03-20 DIAGNOSIS — I25119 Atherosclerotic heart disease of native coronary artery with unspecified angina pectoris: Secondary | ICD-10-CM | POA: Diagnosis not present

## 2024-03-20 DIAGNOSIS — E1142 Type 2 diabetes mellitus with diabetic polyneuropathy: Secondary | ICD-10-CM | POA: Diagnosis not present

## 2024-03-20 DIAGNOSIS — N138 Other obstructive and reflux uropathy: Secondary | ICD-10-CM | POA: Diagnosis not present

## 2024-03-20 DIAGNOSIS — G4733 Obstructive sleep apnea (adult) (pediatric): Secondary | ICD-10-CM | POA: Diagnosis not present

## 2024-03-20 DIAGNOSIS — Z7984 Long term (current) use of oral hypoglycemic drugs: Secondary | ICD-10-CM | POA: Diagnosis not present

## 2024-03-20 DIAGNOSIS — Z8701 Personal history of pneumonia (recurrent): Secondary | ICD-10-CM | POA: Diagnosis not present

## 2024-03-20 DIAGNOSIS — I4891 Unspecified atrial fibrillation: Secondary | ICD-10-CM | POA: Diagnosis not present

## 2024-03-20 DIAGNOSIS — N401 Enlarged prostate with lower urinary tract symptoms: Secondary | ICD-10-CM | POA: Diagnosis not present

## 2024-03-20 DIAGNOSIS — Z96641 Presence of right artificial hip joint: Secondary | ICD-10-CM | POA: Diagnosis not present

## 2024-03-20 DIAGNOSIS — E440 Moderate protein-calorie malnutrition: Secondary | ICD-10-CM | POA: Diagnosis not present

## 2024-03-20 DIAGNOSIS — E785 Hyperlipidemia, unspecified: Secondary | ICD-10-CM | POA: Diagnosis not present

## 2024-03-20 DIAGNOSIS — G9341 Metabolic encephalopathy: Secondary | ICD-10-CM | POA: Diagnosis not present

## 2024-03-21 DIAGNOSIS — E440 Moderate protein-calorie malnutrition: Secondary | ICD-10-CM | POA: Diagnosis not present

## 2024-03-21 DIAGNOSIS — N138 Other obstructive and reflux uropathy: Secondary | ICD-10-CM | POA: Diagnosis not present

## 2024-03-21 DIAGNOSIS — J449 Chronic obstructive pulmonary disease, unspecified: Secondary | ICD-10-CM | POA: Diagnosis not present

## 2024-03-21 DIAGNOSIS — R3914 Feeling of incomplete bladder emptying: Secondary | ICD-10-CM | POA: Diagnosis not present

## 2024-03-21 DIAGNOSIS — I5032 Chronic diastolic (congestive) heart failure: Secondary | ICD-10-CM | POA: Diagnosis not present

## 2024-03-21 DIAGNOSIS — E785 Hyperlipidemia, unspecified: Secondary | ICD-10-CM | POA: Diagnosis not present

## 2024-03-21 DIAGNOSIS — Z9181 History of falling: Secondary | ICD-10-CM | POA: Diagnosis not present

## 2024-03-21 DIAGNOSIS — I110 Hypertensive heart disease with heart failure: Secondary | ICD-10-CM | POA: Diagnosis not present

## 2024-03-21 DIAGNOSIS — I25119 Atherosclerotic heart disease of native coronary artery with unspecified angina pectoris: Secondary | ICD-10-CM | POA: Diagnosis not present

## 2024-03-21 DIAGNOSIS — Z7984 Long term (current) use of oral hypoglycemic drugs: Secondary | ICD-10-CM | POA: Diagnosis not present

## 2024-03-21 DIAGNOSIS — E1142 Type 2 diabetes mellitus with diabetic polyneuropathy: Secondary | ICD-10-CM | POA: Diagnosis not present

## 2024-03-21 DIAGNOSIS — G4733 Obstructive sleep apnea (adult) (pediatric): Secondary | ICD-10-CM | POA: Diagnosis not present

## 2024-03-21 DIAGNOSIS — Z8701 Personal history of pneumonia (recurrent): Secondary | ICD-10-CM | POA: Diagnosis not present

## 2024-03-21 DIAGNOSIS — I4891 Unspecified atrial fibrillation: Secondary | ICD-10-CM | POA: Diagnosis not present

## 2024-03-21 DIAGNOSIS — R350 Frequency of micturition: Secondary | ICD-10-CM | POA: Diagnosis not present

## 2024-03-21 DIAGNOSIS — G9341 Metabolic encephalopathy: Secondary | ICD-10-CM | POA: Diagnosis not present

## 2024-03-21 DIAGNOSIS — N401 Enlarged prostate with lower urinary tract symptoms: Secondary | ICD-10-CM | POA: Diagnosis not present

## 2024-03-21 DIAGNOSIS — Z96641 Presence of right artificial hip joint: Secondary | ICD-10-CM | POA: Diagnosis not present

## 2024-03-21 DIAGNOSIS — S72001D Fracture of unspecified part of neck of right femur, subsequent encounter for closed fracture with routine healing: Secondary | ICD-10-CM | POA: Diagnosis not present

## 2024-03-22 ENCOUNTER — Telehealth: Payer: Self-pay

## 2024-03-22 NOTE — Telephone Encounter (Signed)
 FYI Dr. Jordan.   Copied from CRM #[id number]. Topic: General - Other >> Mar 22, 2024  3:45 PM Victoria A wrote: Reason for CRM: Home Health Physical Therapy appointment was cancelled for today  due to patient having diarrhea and vomiting

## 2024-03-24 DIAGNOSIS — S72001S Fracture of unspecified part of neck of right femur, sequela: Secondary | ICD-10-CM | POA: Diagnosis not present

## 2024-03-24 DIAGNOSIS — Z471 Aftercare following joint replacement surgery: Secondary | ICD-10-CM | POA: Diagnosis not present

## 2024-03-25 ENCOUNTER — Other Ambulatory Visit: Payer: Self-pay | Admitting: Family Medicine

## 2024-03-28 DIAGNOSIS — S72041D Displaced fracture of base of neck of right femur, subsequent encounter for closed fracture with routine healing: Secondary | ICD-10-CM | POA: Diagnosis not present

## 2024-03-31 NOTE — Progress Notes (Signed)
" ° °  03/31/2024  Patient ID: [name], male   DOB: [birth year], 89 y.o.   MRN: [id number]  Pharmacy Quality Measure Review  This patient is appearing on a report for being at risk of failing the adherence measure for diabetes medications this calendar year.   Medication: Glipizide  Last fill date: 03/27/24 for 90 day supply  Insurance report was not up to date. No action needed at this time.   Jon VEAR Lindau, PharmD Clinical Pharmacist [phone]   "

## 2024-04-17 ENCOUNTER — Telehealth: Payer: Self-pay

## 2024-04-17 NOTE — Telephone Encounter (Signed)
 Copied from CRM #[id number]. Topic: General - Call Back - No Documentation >> Apr 17, 2024  3:11 PM Alfonso ORN wrote: Reason for CRM: Nena from [facility] called to report missed visit for patient for 04/14/24 Occupational Therapy

## 2024-04-20 ENCOUNTER — Other Ambulatory Visit: Payer: Self-pay | Admitting: Family Medicine

## 2024-04-20 DIAGNOSIS — R252 Cramp and spasm: Secondary | ICD-10-CM

## 2024-04-21 ENCOUNTER — Encounter: Payer: Self-pay | Admitting: Family Medicine

## 2024-04-21 ENCOUNTER — Ambulatory Visit: Admitting: Family Medicine

## 2024-04-21 VITALS — BP 140/88 | HR 95 | Temp 98.2°F | Resp 16 | Ht 63.0 in | Wt 149.2 lb

## 2024-04-21 DIAGNOSIS — M159 Polyosteoarthritis, unspecified: Secondary | ICD-10-CM | POA: Diagnosis not present

## 2024-04-21 DIAGNOSIS — Z7984 Long term (current) use of oral hypoglycemic drugs: Secondary | ICD-10-CM | POA: Diagnosis not present

## 2024-04-21 DIAGNOSIS — E559 Vitamin D deficiency, unspecified: Secondary | ICD-10-CM | POA: Diagnosis not present

## 2024-04-21 DIAGNOSIS — I4891 Unspecified atrial fibrillation: Secondary | ICD-10-CM | POA: Diagnosis not present

## 2024-04-21 DIAGNOSIS — I119 Hypertensive heart disease without heart failure: Secondary | ICD-10-CM | POA: Diagnosis not present

## 2024-04-21 DIAGNOSIS — I25110 Atherosclerotic heart disease of native coronary artery with unstable angina pectoris: Secondary | ICD-10-CM | POA: Diagnosis not present

## 2024-04-21 DIAGNOSIS — E1140 Type 2 diabetes mellitus with diabetic neuropathy, unspecified: Secondary | ICD-10-CM

## 2024-04-21 MED ORDER — NITROGLYCERIN 0.4 MG SL SUBL: 0.4000 mg | SUBLINGUAL_TABLET | SUBLINGUAL | 1 refills | Status: AC | PRN

## 2024-04-21 MED ORDER — HYDROCODONE-ACETAMINOPHEN 5-325 MG PO TABS: 1.0000 | ORAL_TABLET | Freq: Two times a day (BID) | ORAL | 0 refills | Status: AC | PRN

## 2024-04-21 NOTE — Assessment & Plan Note (Signed)
 He has not had episodes of exertional chest pain since he started Imdur , currently taking 30 mg daily. He is not on an antiplatelet agent nor on statin medication, the latter one caused myalgias. He is not interested in following with cardiologist.

## 2024-04-21 NOTE — Assessment & Plan Note (Signed)
 Hydrocodone -acetaminophen  5-325 mg, initially prescribed for hip fracture, has helped with generalized arthralgias. He will continue medication at bedtime. We discussed side effects. PDMP reviewed. Follow-up in 3 months.

## 2024-04-21 NOTE — Assessment & Plan Note (Signed)
 BP mildly elevated today, reporting home BPs 120s/80. Continue diltiazem  240 mg daily and Imdur  30 mg daily as well as low salt diet. Continue monitoring BP at home.

## 2024-04-21 NOTE — Progress Notes (Unsigned)
 "  Chief Complaint  Patient presents with   Medical Management of Chronic Issues   Discussed the use of AI scribe software for clinical note transcription with the patient, who gave verbal consent to proceed.  History of Present Illness [name] is a 89 year old male a PMHx significant for DM II, HLD, OSA not on CPAP, HTN, chronic diarrhea, BPH, chronic pain, HFpEF, COPD, and peripheral neuropathy here today for chronic disease management.  Last seen on 02/25/24 after right hip arthroplasty on 02/07/2024 due to fall and femoral fracture  Since his last visit he has seen his orthopedist. Atrial fibrillation: Currently he is not on anticoagulation.  He took Eliquis  post-operatively, which he has since discontinued as per his orthopedist's advice, per patient report.  The pain in his hip has improved significantly, and he rarely requires pain medication. He is scheduled to see the surgeon again on the 27th of this month and continues to perform physical therapy at home. Generalized OA: While taking hydrocodone -acetaminophen  5-325 mg, he noted that generalized arthralgias (hips, knees, ankles, and feet especially) improved and he was able to sleep through the night.  He is asking if he can continue opioid medication at bedtime for pain management. No side effects with medication. Using a walker for assistance. He has not had falls since his last visit.  He is currently not experiencing any palpitations.  He is taking diltiazem  240 mg and Imdur  30 mg daily. Home readings typically around 120/80 mmHg.  Negative for unusual headache, CP, dyspnea, orthopnea, PND, focal weakness, worsening lower extremity edema. Missed appt with cardiologist.  He experiences lower extremity edema and takes furosemide  20 mg twice daily, although he sometimes skips doses due to the inconvenience of frequent urination. If he skips the medication for several days, he experiences shortness of breath.   Lab  Results  Component Value Date   NA 143 02/13/2024   CL 103 02/13/2024   K 4.5 02/13/2024   CO2 27 02/13/2024   BUN 26 (H) 02/13/2024   CREATININE 0.83 02/13/2024   GFRNONAA >60 02/13/2024   CALCIUM  8.9 02/13/2024   PHOS 2.7 02/11/2024   ALBUMIN  2.6 (L) 02/11/2024   GLUCOSE 335 (H) 02/13/2024   He had a significant episode of epistaxis a couple of weeks ago, initially involving both nostrils. He has not noticed any other bleeding issues such as hematuria,blood in stool, or melena.  Epistaxis is noted when blowing his nose.  HLD: he is not currently taking any statins due to previous side effects such as muscle cramps.   Diabetes Mellitus II: Dx'ed 15+ years ago. He is on glipizide  5 mg daily. He occasionally checks his blood sugar, noting that it can be high after consuming sugary foods. Negative for symptoms of hypoglycemia, polyuria, polydipsia, foot ulcers/trauma. + Peripheral neuropathy, LE's numbness. He mentions that he tried Gabapentin  but was not effective.  Lab Results  Component Value Date   HGBA1C 6.8 (H) 02/07/2024   Lab Results  Component Value Date   MICROALBUR 5.4 (H) 09/15/2006   Review of Systems  Constitutional:  Negative for appetite change, chills and fever.  HENT:  Negative for sore throat.   Respiratory:  Negative for cough and wheezing.   Gastrointestinal:  Negative for abdominal pain, nausea and vomiting.  Genitourinary:  Negative for decreased urine volume and dysuria.  Musculoskeletal:  Positive for arthralgias and gait problem.  Skin:  Negative for rash.  Neurological:  Negative for syncope and facial asymmetry.  See other pertinent positives and negatives in HPI.  Medications Ordered Prior to Encounter[1]  Past Medical History:  Diagnosis Date   BACK PAIN, CHRONIC 04/18/2009   BENIGN PROSTATIC HYPERTROPHY, HX OF 01/19/2007   Carpal tunnel syndrome 03/24/2007   CORONARY ARTERY DISEASE 01/19/2007   DEPRESSION, CHRONIC 06/24/2007   DIABETES  MELLITUS 01/19/2007   DIABETES MELLITUS, TYPE II, WITH NEUROLOGICAL COMPLICATIONS 06/17/2010   Diarrhea 02/23/2007   HEART MURMUR, SYSTOLIC 01/02/2008   HERPES SIMPLEX INFECTION 01/19/2007   HYPERLIPIDEMIA 01/19/2007   HYPERTENSION 01/19/2007   Intermediate coronary syndrome (HCC) 08/29/2008   LEG CRAMPS, NOCTURNAL 10/28/2007   Leg cramps, sleep related 08/05/2010   OBSTRUCTIVE SLEEP APNEA 01/19/2007   does not use CPAP   OSTEOARTHRITIS 06/24/2007   Other postprocedural status(V45.89) 01/19/2007   PERCUTANEOUS TRANSLUMINAL CORONARY ANGIOPLASTY, HX OF 01/19/2007   PLANTAR FASCIITIS, RIGHT 01/19/2007   SHOULDER PAIN, LEFT 03/24/2007   SPINAL STENOSIS, LUMBAR 06/03/2009   TINEA PEDIS 11/15/2009   URI 01/11/2009    Allergies[2]  Social History   Socioeconomic History   Marital status: Married    Spouse name: Not on file   Number of children: Not on file   Years of education: Not on file   Highest education level: 12th grade  Occupational History   Occupation: retired    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  Tobacco Use   Smoking status: Former    Current packs/day: 0.00    Types: Cigarettes    Quit date: 04/14/1983    Years since quitting: 41.0   Smokeless tobacco: Never   Tobacco comments:    stopped 1985  Vaping Use   Vaping status: Never Used  Substance and Sexual Activity   Alcohol use: Yes    Comment: OCCASSIONAL   Drug use: Not Currently    Types: Marijuana    Comment: occ   Sexual activity: Not on file  Other Topics Concern   Not on file  Social History Narrative   Married in [place] - 58yrs, divorced; married 1974 - 1.5 years, divorced; married 1985 1 daughter 73; 1 step daughters; 2 grandchildren.   Social Drivers of Health   Tobacco Use: Medium Risk (04/21/2024)   Patient History    Smoking Tobacco Use: Former    Smokeless Tobacco Use: Never    Passive Exposure: Not on file  Financial Resource Strain: Low Risk (02/16/2022)   Overall Financial Resource Strain (CARDIA)     Difficulty of Paying Living Expenses: Not hard at all  Food Insecurity: No Food Insecurity (02/07/2024)   Epic    Worried About Programme Researcher, Broadcasting/film/video in the Last Year: Never true    Ran Out of Food in the Last Year: Never true  Transportation Needs: No Transportation Needs (02/07/2024)   Epic    Lack of Transportation (Medical): No    Lack of Transportation (Non-Medical): No  Physical Activity: Insufficiently Active (02/16/2022)   Exercise Vital Sign    Days of Exercise per Week: 1 day    Minutes of Exercise per Session: 10 min  Stress: Stress Concern Present (02/16/2022)   [employer] of Occupational Health - Occupational Stress Questionnaire    Feeling of Stress : To some extent  Social Connections: Moderately Isolated (02/10/2024)   Social Connection and Isolation Panel    Frequency of Communication with Friends and Family: Three times a week    Frequency of Social Gatherings with Friends and Family: Once a week    Attends Religious Services: Never  Active Member of Clubs or Organizations: No    Attends Banker Meetings: Never    Marital Status: Married  Depression (PHQ2-9): Medium Risk (02/25/2024)   Depression (PHQ2-9)    PHQ-2 Score: 6  Alcohol Screen: Low Risk (02/16/2022)   Alcohol Screen    Last Alcohol Screening Score (AUDIT): 4  Housing: Low Risk (02/07/2024)   Epic    Unable to Pay for Housing in the Last Year: No    Number of Times Moved in the Last Year: 0    Homeless in the Last Year: No  Utilities: Not At Risk (02/07/2024)   Epic    Threatened with loss of utilities: No  Health Literacy: Not on file    Today's Vitals   04/21/24 1457 04/21/24 1540  BP: (!) 140/88 (!) 140/88  Pulse: 95   Resp: 16   Temp: 98.2 F (36.8 C)   SpO2: 95%   Weight: 149 lb 3.2 oz (67.7 kg)   Height: 5' 3 (1.6 m)    Body mass index is 26.43 kg/m.  Physical Exam Vitals and nursing note reviewed.  Constitutional:      General: He is not in acute  distress.    Appearance: He is well-developed.  HENT:     Head: Normocephalic and atraumatic.  Eyes:     Conjunctiva/sclera: Conjunctivae normal.  Cardiovascular:     Rate and Rhythm: Normal rate. Rhythm irregular.     Heart sounds: Murmur (Dyastolic I-II/VI RUSB, SEM I/VI LUSB) heard.  Pulmonary:     Effort: Pulmonary effort is normal. No respiratory distress.     Breath sounds: Normal breath sounds.  Abdominal:     Palpations: Abdomen is soft.     Tenderness: There is no abdominal tenderness.  Musculoskeletal:     Right lower leg: 2+ Pitting Edema present.     Left lower leg: 2+ Pitting Edema present.  Skin:    General: Skin is warm.     Findings: No erythema or rash.  Neurological:     General: No focal deficit present.     Mental Status: He is alert and oriented to person, place, and time.     Comments: Antalgic gait assisted with a walker.  Psychiatric:        Mood and Affect: Mood and affect normal.    ASSESSMENT AND PLAN:  [name] was seen today for medical management of chronic issues.  Diagnoses and all orders for this visit:  Orders Placed This Encounter  Procedures   Basic metabolic panel with GFR   CBC   Hemoglobin A1c   VITAMIN D  25 Hydroxy (Vit-D Deficiency, Fractures)   Microalbumin / creatinine urine ratio   Lab Results  Component Value Date   HGBA1C 7.2 (H) 04/21/2024   Lab Results  Component Value Date   NA 140 04/21/2024   CL 97 04/21/2024   K 4.4 04/21/2024   CO2 27 04/21/2024   BUN 13 04/21/2024   CREATININE 0.61 (L) 04/21/2024   EGFR 91 04/21/2024   CALCIUM  9.4 04/21/2024   PHOS 2.7 02/11/2024   ALBUMIN  2.6 (L) 02/11/2024   GLUCOSE 94 04/21/2024   Lab Results  Component Value Date   WBC 6.9 04/21/2024   HGB 15.0 04/21/2024   HCT 47.0 04/21/2024   MCV 91 04/21/2024   PLT 317 04/21/2024   Lab Results  Component Value Date   VD25OH 27.2 (L) 04/21/2024   Type 2 diabetes mellitus with diabetic neuropathy, without  long-term current use of insulin  [facility]) Assessment & Plan: Problem has been well-controlled. Last hemoglobin A1c 6.8 in 01/2024. Continue glipizide  5 mg daily. Continue adequate for care and annual eye exams (overdue). Follow-up in 5 to 6 months.  Orders: -     Basic metabolic panel with GFR; Future -     CBC; Future -     Hemoglobin A1c; Future -     Microalbumin / creatinine urine ratio; Future  Atherosclerosis of native coronary artery of native heart with unstable angina pectoris [facility]) Assessment & Plan: He has not had episodes of exertional chest pain since he started Imdur , currently taking 30 mg daily. He is not on an antiplatelet agent nor on statin medication, the latter one caused myalgias. He is not interested in following with cardiologist.  Orders: -     Nitroglycerin ; Place 1 tablet (0.4 mg total) under the tongue every 5 (five) minutes as needed for chest pain.  Dispense: 50 tablet; Refill: 1  Generalized osteoarthritis of multiple sites Assessment & Plan: Hydrocodone -acetaminophen  5-325 mg, initially prescribed for hip fracture, has helped with generalized arthralgias. He will continue medication at bedtime. We discussed side effects. PDMP reviewed. Follow-up in 3 months.  Orders: -     HYDROcodone -Acetaminophen ; Take 1 tablet by mouth 2 (two) times daily as needed for moderate pain (pain score 4-6) or severe pain (pain score 7-10).  Dispense: 30 tablet; Refill: 0  Hypertension with heart disease Assessment & Plan: BP mildly elevated today, reporting home BPs 120s/80. Continue diltiazem  240 mg daily and Imdur  30 mg daily as well as low salt diet. Continue monitoring BP at home.   Orders: -     Basic metabolic panel with GFR; Future -     CBC; Future  Vitamin D  deficiency, unspecified Assessment & Plan: Completed treatment with ergocalciferol  50,000 units weekly. Further recommendation will be given according to 25 OH vitamin D  result.  Orders: -      VITAMIN D  25 Hydroxy (Vit-D Deficiency, Fractures); Future  Atrial fibrillation with RVR (HCC) Assessment & Plan: Persistent. He is not interested in chronic anticoagulation nor in following with cardiologist. He missed appointment with cardiologist on 03/08/2024. Continue diltiazem  240 mg daily.    I personally spent a total of 41 minutes in the care of the patient today including preparing to see the patient, getting/reviewing separately obtained history, performing a medically appropriate exam/evaluation, counseling and educating, placing orders, and documenting clinical information in the EHR.  Return in about 3 months (around 07/20/2024) for chronic problems.   Siddalee Vanderheiden, MD [facility]. [place] office.      [1]  Current Outpatient Medications on File Prior to Visit  Medication Sig Dispense Refill   alfuzosin  (UROXATRAL ) 10 MG 24 hr tablet TAKE 1 TABLET (10 MG TOTAL) BY MOUTH DAILY WITH BREAKFAST. 90 tablet 2   Blood Glucose Monitoring Suppl (ACCU-CHEK AVIVA CONNECT) w/Device KIT 1 Device by Does not apply route daily. 1 kit 0   Blood Glucose Monitoring Suppl (ONE TOUCH ULTRA 2) w/Device KIT Daily as directed. 1 kit 0   Cyanocobalamin  (B-12) 1000 MCG TABS Take 1,000 mcg by mouth daily.     finasteride  (PROSCAR ) 5 MG tablet TAKE 1 TABLET BY MOUTH EVERY DAY 90 tablet 2   furosemide  (LASIX ) 40 MG tablet Take 1 tablet (40 mg total) by mouth daily.     glipiZIDE  (GLUCOTROL ) 5 MG tablet TAKE 1 TABLET BY MOUTH DAILY BEFORE BREAKFAST. 20-30 MINUTES BEFORE MEAL. 90 tablet 1  Ipratropium-Albuterol  (COMBIVENT  RESPIMAT) 20-100 MCG/ACT AERS respimat Inhale 1 puff into the lungs every 6 (six) hours as needed for wheezing. 4 g 1   isosorbide  mononitrate (IMDUR ) 30 MG 24 hr tablet TAKE 1 TABLET BY MOUTH EVERY DAY 90 tablet 2   Lancet Devices (LANCET DEVICE WITH EJECTOR) MISC 1 Device by Does not apply route daily. 1 each 1   loperamide  (IMODIUM  A-D) 2 MG tablet Take 2 mg by  mouth 4 (four) times daily as needed for diarrhea or loose stools.     OneTouch Delica Lancets 33G MISC USE TO CHECK BLOOD SUGAR DAILY AND PRN 100 each 4   ONETOUCH ULTRA test strip USE TO CHECK BLOOD SUGAR ONCE DAILY E11.9 100 strip 4   tizanidine  (ZANAFLEX ) 2 MG capsule TAKE 4 CAPSULES (8 MG TOTAL) BY MOUTH AT BEDTIME AS NEEDED FOR MUSCLE SPASMS. 360 capsule 1   diltiazem  (CARDIZEM  CD) 240 MG 24 hr capsule Take 1 capsule (240 mg total) by mouth daily. 90 capsule 1   feeding supplement (ENSURE PLUS HIGH PROTEIN) LIQD Take 237 mLs by mouth 2 (two) times daily between meals. (Patient not taking: Reported on 04/21/2024)     Nutritional Supplements (,FEEDING SUPPLEMENT, PROSOURCE PLUS) liquid Take 30 mLs by mouth 2 (two) times daily between meals. (Patient not taking: Reported on 04/21/2024)     No current facility-administered medications on file prior to visit.  [2] No Known Allergies  "

## 2024-04-21 NOTE — Assessment & Plan Note (Signed)
 Persistent. He is not interested in chronic anticoagulation nor in following with cardiologist. He missed appointment with cardiologist on 03/08/2024. Continue diltiazem  240 mg daily.

## 2024-04-21 NOTE — Assessment & Plan Note (Signed)
 Problem has been well-controlled. Last hemoglobin A1c 6.8 in 01/2024. Continue glipizide  5 mg daily. Continue adequate for care and annual eye exams (overdue). Follow-up in 5 to 6 months.

## 2024-04-21 NOTE — Patient Instructions (Addendum)
 A few things to remember from today's visit:  Type 2 diabetes mellitus with diabetic neuropathy, without long-term current use of insulin  (HCC) - Plan: Basic metabolic panel with GFR, CBC, Hemoglobin A1c, Microalbumin / creatinine urine ratio  Atherosclerosis of native coronary artery of native heart with unstable angina pectoris (HCC) - Plan: nitroGLYCERIN  (NITROSTAT ) 0.4 MG SL tablet  Generalized osteoarthritis of multiple sites - Plan: HYDROcodone -acetaminophen  (NORCO/VICODIN) 5-325 MG tablet  Hypertension with heart disease - Plan: Basic metabolic panel with GFR, CBC  Vitamin D  deficiency, unspecified - Plan: VITAMIN D  25 Hydroxy (Vit-D Deficiency, Fractures)  No changes today. Continue Hydrocodone -Acetaminophen  at bedtime.  If you need refills for medications you take chronically, please call your pharmacy. Do not use My Chart to request refills or for acute issues that need immediate attention. If you send a my chart message, it may take a few days to be addressed, specially if I am not in the office.  Please be sure medication list is accurate. If a new problem present, please set up appointment sooner than planned today.

## 2024-04-21 NOTE — Assessment & Plan Note (Signed)
 Completed treatment with ergocalciferol  50,000 units weekly. Further recommendation will be given according to 25 OH vitamin D  result.

## 2024-04-22 ENCOUNTER — Ambulatory Visit: Payer: Self-pay | Admitting: Family Medicine

## 2024-04-22 LAB — BASIC METABOLIC PANEL WITH GFR
BUN/Creatinine Ratio: 21 (ref 10–24)
BUN: 13 mg/dL (ref 10–36)
CO2: 27 mmol/L (ref 20–29)
Calcium: 9.4 mg/dL (ref 8.6–10.2)
Chloride: 97 mmol/L (ref 96–106)
Creatinine, Ser: 0.61 mg/dL — ABNORMAL LOW (ref 0.76–1.27)
Glucose: 94 mg/dL (ref 70–99)
Potassium: 4.4 mmol/L (ref 3.5–5.2)
Sodium: 140 mmol/L (ref 134–144)
eGFR: 91 mL/min/1.73

## 2024-04-22 LAB — CBC
Hematocrit: 47 % (ref 37.5–51.0)
Hemoglobin: 15 g/dL (ref 13.0–17.7)
MCH: 29.1 pg (ref 26.6–33.0)
MCHC: 31.9 g/dL (ref 31.5–35.7)
MCV: 91 fL (ref 79–97)
Platelets: 317 x10E3/uL (ref 150–450)
RBC: 5.15 x10E6/uL (ref 4.14–5.80)
RDW: 12.6 % (ref 11.6–15.4)
WBC: 6.9 x10E3/uL (ref 3.4–10.8)

## 2024-04-22 LAB — HEMOGLOBIN A1C
Est. average glucose Bld gHb Est-mCnc: 160 mg/dL
Hgb A1c MFr Bld: 7.2 % — ABNORMAL HIGH (ref 4.8–5.6)

## 2024-04-22 LAB — VITAMIN D 25 HYDROXY (VIT D DEFICIENCY, FRACTURES): Vit D, 25-Hydroxy: 27.2 ng/mL — ABNORMAL LOW (ref 30.0–100.0)

## 2024-04-27 ENCOUNTER — Telehealth: Payer: Self-pay | Admitting: *Deleted

## 2024-04-27 NOTE — Telephone Encounter (Signed)
 Copied from CRM [id number]. Topic: Clinical - Home Health Verbal Orders >> Apr 27, 2024 11:00 AM Chasity T wrote: Caller/Agency: [facility] Callback Number: [phone] Service Requested: Physical Therapy Frequency: continuous with PT 1X7 weeks Any new concerns about the patient? No

## 2024-04-28 NOTE — Telephone Encounter (Signed)
It is ok to give verbal authorization to requested services. Thanks, BJ 

## 2024-04-28 NOTE — Telephone Encounter (Signed)
 Called and LVM with PT approving orders/services.

## 2024-05-04 ENCOUNTER — Ambulatory Visit: Admitting: Family Medicine

## 2024-05-09 ENCOUNTER — Telehealth: Payer: Self-pay

## 2024-05-09 NOTE — Telephone Encounter (Signed)
 Called the [facility] and gave her the okay for her PT orders.    Copied from CRM [id number]. Topic: Clinical - Home Health Verbal Orders >> Apr 27, 2024 11:00 AM Chasity T wrote: Caller/Agency: [facility] Callback Number: [phone] Service Requested: Physical Therapy Frequency: continuous with PT 1X7 weeks Any new concerns about the patient? No >> May 09, 2024  9:53 AM Deleta RAMAN wrote: Holli is calling regarding updates on physical therapy home health orders. Please contact the number above [phone]. Please leave a message if not available. Aware of call back time
